# Patient Record
Sex: Female | Born: 1935
Health system: Southern US, Community
[De-identification: ages and names within clinical notes are randomized; demographics above are authoritative.]

## PROBLEM LIST (undated history)

## (undated) DIAGNOSIS — I82409 Acute embolism and thrombosis of unspecified deep veins of unspecified lower extremity: Secondary | ICD-10-CM

## (undated) DIAGNOSIS — K589 Irritable bowel syndrome without diarrhea: Secondary | ICD-10-CM

## (undated) DIAGNOSIS — K635 Polyp of colon: Secondary | ICD-10-CM

## (undated) DIAGNOSIS — C50919 Malignant neoplasm of unspecified site of unspecified female breast: Secondary | ICD-10-CM

## (undated) DIAGNOSIS — I10 Essential (primary) hypertension: Secondary | ICD-10-CM

## (undated) DIAGNOSIS — J189 Pneumonia, unspecified organism: Secondary | ICD-10-CM

## (undated) DIAGNOSIS — E782 Mixed hyperlipidemia: Secondary | ICD-10-CM

## (undated) DIAGNOSIS — I8390 Asymptomatic varicose veins of unspecified lower extremity: Secondary | ICD-10-CM

## (undated) DIAGNOSIS — K5790 Diverticulosis of intestine, part unspecified, without perforation or abscess without bleeding: Secondary | ICD-10-CM

## (undated) DIAGNOSIS — N390 Urinary tract infection, site not specified: Secondary | ICD-10-CM

## (undated) DIAGNOSIS — I509 Heart failure, unspecified: Secondary | ICD-10-CM

## (undated) DIAGNOSIS — I4892 Unspecified atrial flutter: Secondary | ICD-10-CM

## (undated) DIAGNOSIS — M810 Age-related osteoporosis without current pathological fracture: Secondary | ICD-10-CM

## (undated) DIAGNOSIS — M1990 Unspecified osteoarthritis, unspecified site: Secondary | ICD-10-CM

## (undated) DIAGNOSIS — I495 Sick sinus syndrome: Secondary | ICD-10-CM

## (undated) DIAGNOSIS — I4819 Other persistent atrial fibrillation: Secondary | ICD-10-CM

## (undated) DIAGNOSIS — K8020 Calculus of gallbladder without cholecystitis without obstruction: Secondary | ICD-10-CM

## (undated) DIAGNOSIS — K219 Gastro-esophageal reflux disease without esophagitis: Secondary | ICD-10-CM

## (undated) DIAGNOSIS — Z950 Presence of cardiac pacemaker: Secondary | ICD-10-CM

## (undated) HISTORY — DX: Gastro-esophageal reflux disease without esophagitis: K21.9

## (undated) HISTORY — DX: Acute embolism and thrombosis of unspecified deep veins of unspecified lower extremity: I82.409

## (undated) HISTORY — DX: Mixed hyperlipidemia: E78.2

## (undated) HISTORY — DX: Diverticulosis of intestine, part unspecified, without perforation or abscess without bleeding: K57.90

## (undated) HISTORY — DX: Irritable bowel syndrome, unspecified: K58.9

## (undated) HISTORY — PX: EYE SURGERY: SHX253

## (undated) HISTORY — PX: INSERT / REPLACE / REMOVE PACEMAKER: SUR710

## (undated) HISTORY — PX: OTHER SURGICAL HISTORY: SHX169

## (undated) HISTORY — DX: Calculus of gallbladder without cholecystitis without obstruction: K80.20

## (undated) HISTORY — DX: Sick sinus syndrome: I49.5

## (undated) HISTORY — DX: Other persistent atrial fibrillation: I48.19

## (undated) HISTORY — DX: Pneumonia, unspecified organism: J18.9

## (undated) HISTORY — PX: BREAST LUMPECTOMY: SHX2

## (undated) HISTORY — PX: CHOLECYSTECTOMY: SHX55

## (undated) HISTORY — DX: Asymptomatic varicose veins of unspecified lower extremity: I83.90

## (undated) HISTORY — DX: Malignant neoplasm of unspecified site of unspecified female breast: C50.919

## (undated) HISTORY — DX: Unspecified osteoarthritis, unspecified site: M19.90

## (undated) HISTORY — DX: Essential (primary) hypertension: I10

## (undated) HISTORY — DX: Polyp of colon: K63.5

## (undated) HISTORY — PX: DILATION AND CURETTAGE OF UTERUS: SHX78

## (undated) HISTORY — PX: COLONOSCOPY W/ BIOPSIES AND POLYPECTOMY: SHX1376

## (undated) HISTORY — PX: CYST REMOVAL TRUNK: SHX6283

## (undated) HISTORY — DX: Unspecified atrial flutter: I48.92

---

## 1979-02-11 HISTORY — PX: BREAST LUMPECTOMY: SHX2

## 1997-06-12 HISTORY — PX: CARDIAC CATHETERIZATION: SHX172

## 1999-07-19 ENCOUNTER — Encounter: Admission: RE | Admit: 1999-07-19 | Discharge: 1999-07-19 | Payer: Self-pay | Admitting: Obstetrics and Gynecology

## 1999-07-19 ENCOUNTER — Encounter: Payer: Self-pay | Admitting: Obstetrics and Gynecology

## 2000-09-03 ENCOUNTER — Encounter: Admission: RE | Admit: 2000-09-03 | Discharge: 2000-09-03 | Payer: Self-pay | Admitting: Obstetrics and Gynecology

## 2000-09-03 ENCOUNTER — Encounter: Payer: Self-pay | Admitting: Obstetrics and Gynecology

## 2000-09-14 ENCOUNTER — Ambulatory Visit (HOSPITAL_COMMUNITY): Admission: RE | Admit: 2000-09-14 | Discharge: 2000-09-15 | Payer: Self-pay | Admitting: Internal Medicine

## 2000-12-07 ENCOUNTER — Encounter: Admission: RE | Admit: 2000-12-07 | Discharge: 2000-12-26 | Payer: Self-pay | Admitting: Orthopedic Surgery

## 2001-06-12 HISTORY — PX: PACEMAKER INSERTION: SHX728

## 2001-12-23 ENCOUNTER — Encounter (INDEPENDENT_AMBULATORY_CARE_PROVIDER_SITE_OTHER): Payer: Self-pay | Admitting: Cardiology

## 2001-12-23 ENCOUNTER — Inpatient Hospital Stay (HOSPITAL_COMMUNITY): Admission: EM | Admit: 2001-12-23 | Discharge: 2001-12-25 | Payer: Self-pay | Admitting: *Deleted

## 2001-12-23 ENCOUNTER — Encounter: Payer: Self-pay | Admitting: *Deleted

## 2002-04-02 ENCOUNTER — Observation Stay (HOSPITAL_COMMUNITY): Admission: EM | Admit: 2002-04-02 | Discharge: 2002-04-03 | Payer: Self-pay | Admitting: Cardiology

## 2002-04-07 ENCOUNTER — Ambulatory Visit (HOSPITAL_COMMUNITY): Admission: RE | Admit: 2002-04-07 | Discharge: 2002-04-09 | Payer: Self-pay | Admitting: Internal Medicine

## 2002-04-09 ENCOUNTER — Encounter: Payer: Self-pay | Admitting: Internal Medicine

## 2003-03-24 ENCOUNTER — Encounter: Payer: Self-pay | Admitting: Internal Medicine

## 2004-04-13 ENCOUNTER — Ambulatory Visit: Payer: Self-pay | Admitting: Family Medicine

## 2004-04-21 ENCOUNTER — Ambulatory Visit: Payer: Self-pay

## 2004-05-03 ENCOUNTER — Ambulatory Visit: Payer: Self-pay | Admitting: Cardiology

## 2004-05-31 ENCOUNTER — Ambulatory Visit: Payer: Self-pay | Admitting: Cardiology

## 2004-07-06 ENCOUNTER — Ambulatory Visit: Payer: Self-pay | Admitting: Cardiology

## 2004-07-19 ENCOUNTER — Ambulatory Visit: Payer: Self-pay | Admitting: Family Medicine

## 2004-07-22 ENCOUNTER — Ambulatory Visit: Payer: Self-pay | Admitting: Internal Medicine

## 2004-07-22 ENCOUNTER — Ambulatory Visit: Payer: Self-pay | Admitting: Family Medicine

## 2004-07-25 ENCOUNTER — Ambulatory Visit: Payer: Self-pay | Admitting: Cardiology

## 2004-07-29 ENCOUNTER — Ambulatory Visit: Payer: Self-pay | Admitting: Internal Medicine

## 2004-08-05 ENCOUNTER — Ambulatory Visit: Payer: Self-pay | Admitting: Cardiology

## 2004-09-02 ENCOUNTER — Ambulatory Visit: Payer: Self-pay | Admitting: Cardiology

## 2004-09-05 ENCOUNTER — Ambulatory Visit: Payer: Self-pay | Admitting: Cardiology

## 2004-09-12 ENCOUNTER — Ambulatory Visit: Payer: Self-pay | Admitting: Internal Medicine

## 2004-09-19 ENCOUNTER — Ambulatory Visit: Payer: Self-pay | Admitting: Cardiology

## 2004-09-22 ENCOUNTER — Ambulatory Visit: Payer: Self-pay | Admitting: Cardiology

## 2004-10-07 ENCOUNTER — Ambulatory Visit: Payer: Self-pay | Admitting: Cardiology

## 2004-10-14 ENCOUNTER — Ambulatory Visit: Payer: Self-pay | Admitting: Cardiology

## 2004-10-21 ENCOUNTER — Ambulatory Visit: Payer: Self-pay | Admitting: Internal Medicine

## 2004-11-03 ENCOUNTER — Ambulatory Visit: Payer: Self-pay | Admitting: Cardiology

## 2004-11-11 ENCOUNTER — Ambulatory Visit: Payer: Self-pay | Admitting: Cardiology

## 2004-11-25 ENCOUNTER — Ambulatory Visit: Payer: Self-pay | Admitting: Cardiology

## 2004-12-26 ENCOUNTER — Ambulatory Visit: Payer: Self-pay | Admitting: Cardiology

## 2005-01-20 ENCOUNTER — Ambulatory Visit: Payer: Self-pay | Admitting: Internal Medicine

## 2005-01-23 ENCOUNTER — Ambulatory Visit: Payer: Self-pay | Admitting: Cardiology

## 2005-02-20 ENCOUNTER — Ambulatory Visit: Payer: Self-pay | Admitting: Cardiology

## 2005-03-14 ENCOUNTER — Ambulatory Visit: Payer: Self-pay | Admitting: Cardiology

## 2005-04-17 ENCOUNTER — Ambulatory Visit: Payer: Self-pay | Admitting: Cardiology

## 2005-04-19 ENCOUNTER — Ambulatory Visit: Payer: Self-pay | Admitting: Internal Medicine

## 2005-04-28 ENCOUNTER — Ambulatory Visit: Payer: Self-pay | Admitting: Family Medicine

## 2005-05-16 ENCOUNTER — Ambulatory Visit: Payer: Self-pay | Admitting: Cardiology

## 2005-05-22 ENCOUNTER — Ambulatory Visit: Payer: Self-pay | Admitting: Internal Medicine

## 2005-05-24 ENCOUNTER — Ambulatory Visit: Payer: Self-pay | Admitting: Cardiology

## 2005-05-30 ENCOUNTER — Ambulatory Visit: Payer: Self-pay | Admitting: Family Medicine

## 2005-06-13 ENCOUNTER — Ambulatory Visit: Payer: Self-pay | Admitting: Cardiology

## 2005-06-21 ENCOUNTER — Ambulatory Visit: Payer: Self-pay | Admitting: Internal Medicine

## 2005-07-25 ENCOUNTER — Ambulatory Visit: Payer: Self-pay | Admitting: Cardiology

## 2005-07-27 ENCOUNTER — Ambulatory Visit: Payer: Self-pay | Admitting: Internal Medicine

## 2005-08-01 ENCOUNTER — Ambulatory Visit: Payer: Self-pay | Admitting: Cardiology

## 2005-08-08 ENCOUNTER — Ambulatory Visit: Payer: Self-pay | Admitting: Cardiology

## 2005-08-15 ENCOUNTER — Ambulatory Visit: Payer: Self-pay | Admitting: Cardiology

## 2005-08-28 ENCOUNTER — Ambulatory Visit: Payer: Self-pay | Admitting: Internal Medicine

## 2005-09-25 ENCOUNTER — Ambulatory Visit: Payer: Self-pay | Admitting: Family Medicine

## 2005-09-28 ENCOUNTER — Ambulatory Visit: Payer: Self-pay | Admitting: Cardiology

## 2005-10-26 ENCOUNTER — Ambulatory Visit: Payer: Self-pay | Admitting: Cardiology

## 2005-10-30 ENCOUNTER — Ambulatory Visit: Payer: Self-pay | Admitting: Internal Medicine

## 2005-12-05 ENCOUNTER — Ambulatory Visit: Payer: Self-pay | Admitting: Cardiology

## 2005-12-11 ENCOUNTER — Ambulatory Visit: Payer: Self-pay | Admitting: Internal Medicine

## 2005-12-21 ENCOUNTER — Ambulatory Visit: Payer: Self-pay | Admitting: Family Medicine

## 2006-01-02 ENCOUNTER — Ambulatory Visit: Payer: Self-pay | Admitting: Cardiology

## 2006-01-15 ENCOUNTER — Ambulatory Visit: Payer: Self-pay | Admitting: Internal Medicine

## 2006-01-30 ENCOUNTER — Ambulatory Visit: Payer: Self-pay | Admitting: Cardiology

## 2006-02-09 ENCOUNTER — Ambulatory Visit: Payer: Self-pay | Admitting: Internal Medicine

## 2006-02-14 ENCOUNTER — Ambulatory Visit: Payer: Self-pay | Admitting: Family Medicine

## 2006-02-21 ENCOUNTER — Ambulatory Visit: Payer: Self-pay | Admitting: Family Medicine

## 2006-04-03 ENCOUNTER — Ambulatory Visit: Payer: Self-pay | Admitting: Internal Medicine

## 2006-04-04 ENCOUNTER — Ambulatory Visit: Payer: Self-pay | Admitting: Cardiology

## 2006-04-05 ENCOUNTER — Ambulatory Visit: Payer: Self-pay | Admitting: Cardiology

## 2006-04-12 ENCOUNTER — Ambulatory Visit: Payer: Self-pay | Admitting: Cardiology

## 2006-04-19 ENCOUNTER — Ambulatory Visit: Payer: Self-pay | Admitting: Cardiology

## 2006-04-23 ENCOUNTER — Ambulatory Visit: Payer: Self-pay | Admitting: Family Medicine

## 2006-05-04 ENCOUNTER — Ambulatory Visit: Payer: Self-pay | Admitting: Cardiology

## 2006-05-07 ENCOUNTER — Ambulatory Visit: Payer: Self-pay | Admitting: Internal Medicine

## 2006-05-11 ENCOUNTER — Ambulatory Visit: Payer: Self-pay | Admitting: Cardiology

## 2006-05-14 ENCOUNTER — Ambulatory Visit: Payer: Self-pay

## 2006-05-21 ENCOUNTER — Ambulatory Visit: Payer: Self-pay | Admitting: Cardiology

## 2006-06-11 ENCOUNTER — Ambulatory Visit: Payer: Self-pay | Admitting: Cardiology

## 2006-06-26 ENCOUNTER — Ambulatory Visit: Payer: Self-pay | Admitting: Internal Medicine

## 2006-07-02 ENCOUNTER — Ambulatory Visit: Payer: Self-pay | Admitting: Cardiology

## 2006-07-31 ENCOUNTER — Ambulatory Visit: Payer: Self-pay | Admitting: Cardiology

## 2006-08-30 ENCOUNTER — Ambulatory Visit: Payer: Self-pay | Admitting: Cardiology

## 2006-09-06 ENCOUNTER — Ambulatory Visit: Payer: Self-pay | Admitting: Cardiology

## 2006-09-18 ENCOUNTER — Ambulatory Visit: Payer: Self-pay | Admitting: Internal Medicine

## 2006-09-20 ENCOUNTER — Ambulatory Visit: Payer: Self-pay | Admitting: Cardiology

## 2006-10-03 ENCOUNTER — Ambulatory Visit: Payer: Self-pay | Admitting: Family Medicine

## 2006-11-29 ENCOUNTER — Ambulatory Visit: Payer: Self-pay | Admitting: Physician Assistant

## 2006-12-11 ENCOUNTER — Ambulatory Visit: Payer: Self-pay | Admitting: Internal Medicine

## 2007-01-02 ENCOUNTER — Ambulatory Visit: Payer: Self-pay | Admitting: Cardiology

## 2007-01-09 ENCOUNTER — Ambulatory Visit: Payer: Self-pay | Admitting: Internal Medicine

## 2007-01-30 ENCOUNTER — Ambulatory Visit: Payer: Self-pay | Admitting: Cardiology

## 2007-02-23 ENCOUNTER — Ambulatory Visit: Payer: Self-pay | Admitting: Internal Medicine

## 2007-02-27 ENCOUNTER — Ambulatory Visit: Payer: Self-pay | Admitting: Cardiology

## 2007-03-27 ENCOUNTER — Ambulatory Visit: Payer: Self-pay | Admitting: Cardiology

## 2007-04-24 ENCOUNTER — Ambulatory Visit: Payer: Self-pay | Admitting: Cardiology

## 2007-05-28 ENCOUNTER — Ambulatory Visit: Payer: Self-pay | Admitting: Internal Medicine

## 2007-05-30 ENCOUNTER — Ambulatory Visit: Payer: Self-pay | Admitting: Cardiology

## 2007-06-27 ENCOUNTER — Ambulatory Visit: Payer: Self-pay | Admitting: Cardiology

## 2007-07-22 ENCOUNTER — Ambulatory Visit: Payer: Self-pay | Admitting: Cardiology

## 2007-07-29 ENCOUNTER — Ambulatory Visit: Payer: Self-pay | Admitting: Internal Medicine

## 2007-08-22 ENCOUNTER — Ambulatory Visit: Payer: Self-pay | Admitting: Cardiology

## 2007-09-03 ENCOUNTER — Ambulatory Visit (HOSPITAL_COMMUNITY): Admission: RE | Admit: 2007-09-03 | Discharge: 2007-09-03 | Payer: Self-pay | Admitting: Ophthalmology

## 2007-09-26 ENCOUNTER — Ambulatory Visit: Payer: Self-pay | Admitting: Cardiology

## 2007-10-08 ENCOUNTER — Ambulatory Visit (HOSPITAL_COMMUNITY): Admission: RE | Admit: 2007-10-08 | Discharge: 2007-10-08 | Payer: Self-pay | Admitting: Ophthalmology

## 2007-10-24 ENCOUNTER — Ambulatory Visit: Payer: Self-pay | Admitting: Cardiology

## 2007-10-28 ENCOUNTER — Ambulatory Visit: Payer: Self-pay | Admitting: Internal Medicine

## 2007-12-09 ENCOUNTER — Ambulatory Visit: Payer: Self-pay | Admitting: Cardiology

## 2008-02-07 ENCOUNTER — Ambulatory Visit: Payer: Self-pay | Admitting: Internal Medicine

## 2008-02-10 ENCOUNTER — Emergency Department (HOSPITAL_COMMUNITY): Admission: EM | Admit: 2008-02-10 | Discharge: 2008-02-10 | Payer: Self-pay | Admitting: *Deleted

## 2008-02-10 ENCOUNTER — Encounter: Payer: Self-pay | Admitting: Cardiology

## 2008-04-03 ENCOUNTER — Ambulatory Visit: Payer: Self-pay | Admitting: Cardiology

## 2008-04-06 ENCOUNTER — Ambulatory Visit: Payer: Self-pay | Admitting: Cardiology

## 2008-04-27 ENCOUNTER — Ambulatory Visit: Payer: Self-pay | Admitting: Internal Medicine

## 2008-05-05 ENCOUNTER — Ambulatory Visit: Payer: Self-pay | Admitting: Cardiology

## 2008-05-15 ENCOUNTER — Encounter (INDEPENDENT_AMBULATORY_CARE_PROVIDER_SITE_OTHER): Payer: Self-pay | Admitting: *Deleted

## 2008-05-15 ENCOUNTER — Ambulatory Visit: Payer: Self-pay | Admitting: Internal Medicine

## 2008-05-15 DIAGNOSIS — Z860100 Personal history of colon polyps, unspecified: Secondary | ICD-10-CM | POA: Insufficient documentation

## 2008-05-15 DIAGNOSIS — I480 Paroxysmal atrial fibrillation: Secondary | ICD-10-CM | POA: Insufficient documentation

## 2008-05-26 ENCOUNTER — Ambulatory Visit: Payer: Self-pay | Admitting: Cardiology

## 2008-06-09 ENCOUNTER — Encounter: Payer: Self-pay | Admitting: Internal Medicine

## 2008-06-09 ENCOUNTER — Ambulatory Visit: Payer: Self-pay | Admitting: Internal Medicine

## 2008-06-14 ENCOUNTER — Encounter: Payer: Self-pay | Admitting: Internal Medicine

## 2008-07-08 ENCOUNTER — Encounter: Payer: Self-pay | Admitting: Internal Medicine

## 2008-07-27 ENCOUNTER — Ambulatory Visit: Payer: Self-pay | Admitting: Internal Medicine

## 2008-07-28 ENCOUNTER — Encounter: Payer: Self-pay | Admitting: Internal Medicine

## 2008-08-06 ENCOUNTER — Ambulatory Visit: Payer: Self-pay | Admitting: Internal Medicine

## 2008-09-10 ENCOUNTER — Encounter: Payer: Self-pay | Admitting: Internal Medicine

## 2008-09-11 ENCOUNTER — Encounter: Payer: Self-pay | Admitting: Internal Medicine

## 2008-09-14 ENCOUNTER — Telehealth: Payer: Self-pay | Admitting: Internal Medicine

## 2008-10-26 ENCOUNTER — Ambulatory Visit: Payer: Self-pay | Admitting: Internal Medicine

## 2008-11-14 ENCOUNTER — Ambulatory Visit: Payer: Self-pay | Admitting: Cardiology

## 2008-11-14 ENCOUNTER — Encounter: Payer: Self-pay | Admitting: Cardiology

## 2008-11-15 ENCOUNTER — Encounter: Payer: Self-pay | Admitting: Cardiology

## 2008-11-16 ENCOUNTER — Encounter: Payer: Self-pay | Admitting: Cardiology

## 2008-11-20 ENCOUNTER — Telehealth: Payer: Self-pay | Admitting: Internal Medicine

## 2008-12-03 ENCOUNTER — Ambulatory Visit: Payer: Self-pay | Admitting: Cardiology

## 2008-12-07 ENCOUNTER — Inpatient Hospital Stay (HOSPITAL_COMMUNITY): Admission: EM | Admit: 2008-12-07 | Discharge: 2008-12-13 | Payer: Self-pay | Admitting: Emergency Medicine

## 2008-12-07 ENCOUNTER — Encounter: Payer: Self-pay | Admitting: Cardiology

## 2009-02-02 DIAGNOSIS — I10 Essential (primary) hypertension: Secondary | ICD-10-CM | POA: Insufficient documentation

## 2009-02-02 DIAGNOSIS — E785 Hyperlipidemia, unspecified: Secondary | ICD-10-CM | POA: Insufficient documentation

## 2009-02-05 ENCOUNTER — Ambulatory Visit: Payer: Self-pay | Admitting: Internal Medicine

## 2009-03-22 ENCOUNTER — Encounter: Payer: Self-pay | Admitting: Cardiology

## 2009-05-10 ENCOUNTER — Encounter: Payer: Self-pay | Admitting: Internal Medicine

## 2009-05-10 ENCOUNTER — Ambulatory Visit: Payer: Self-pay | Admitting: Internal Medicine

## 2009-06-12 DIAGNOSIS — J189 Pneumonia, unspecified organism: Secondary | ICD-10-CM

## 2009-06-12 HISTORY — DX: Pneumonia, unspecified organism: J18.9

## 2009-06-18 ENCOUNTER — Encounter: Payer: Self-pay | Admitting: Cardiology

## 2009-06-18 ENCOUNTER — Ambulatory Visit: Payer: Self-pay | Admitting: Cardiology

## 2009-07-06 ENCOUNTER — Encounter: Payer: Self-pay | Admitting: Cardiology

## 2009-07-23 ENCOUNTER — Ambulatory Visit: Payer: Self-pay | Admitting: Internal Medicine

## 2009-08-09 ENCOUNTER — Ambulatory Visit: Payer: Self-pay | Admitting: Internal Medicine

## 2009-08-10 ENCOUNTER — Telehealth (INDEPENDENT_AMBULATORY_CARE_PROVIDER_SITE_OTHER): Payer: Self-pay | Admitting: *Deleted

## 2009-11-09 ENCOUNTER — Ambulatory Visit: Payer: Self-pay | Admitting: Internal Medicine

## 2009-12-14 ENCOUNTER — Encounter: Payer: Self-pay | Admitting: Cardiology

## 2009-12-30 ENCOUNTER — Ambulatory Visit: Payer: Self-pay | Admitting: Cardiology

## 2010-02-08 ENCOUNTER — Ambulatory Visit: Payer: Self-pay | Admitting: Internal Medicine

## 2010-03-19 ENCOUNTER — Ambulatory Visit (HOSPITAL_COMMUNITY): Admission: RE | Admit: 2010-03-19 | Discharge: 2010-03-19 | Payer: Self-pay | Admitting: Orthopedic Surgery

## 2010-03-26 ENCOUNTER — Emergency Department (HOSPITAL_COMMUNITY): Admission: EM | Admit: 2010-03-26 | Discharge: 2010-03-26 | Payer: Self-pay | Admitting: Emergency Medicine

## 2010-04-29 ENCOUNTER — Ambulatory Visit: Payer: Self-pay | Admitting: Internal Medicine

## 2010-06-29 ENCOUNTER — Encounter: Payer: Self-pay | Admitting: Internal Medicine

## 2010-07-11 ENCOUNTER — Ambulatory Visit: Admit: 2010-07-11 | Payer: Self-pay | Admitting: Cardiology

## 2010-07-12 NOTE — Cardiovascular Report (Signed)
Summary: TTM   TTM   Imported By: Roderic Ovens 02/28/2010 11:02:44  _____________________________________________________________________  External Attachment:    Type:   Image     Comment:   External Document

## 2010-07-12 NOTE — Cardiovascular Report (Signed)
Summary: TTM   TTM   Imported By: Roderic Ovens 08/19/2009 16:18:06  _____________________________________________________________________  External Attachment:    Type:   Image     Comment:   External Document

## 2010-07-12 NOTE — Assessment & Plan Note (Signed)
Summary: 6 mo fu per dec reminder-srs  Medications Added GLUCOSAMINE-CHONDROITIN 1500-1200 MG/30ML LIQD (GLUCOSAMINE-CHONDROITIN) two tablet by mouth once daily      Allergies Added: NKDA  Visit Type:  Follow-up Primary Provider:  Dr. Joette Catching   History of Present Illness: 77 year old woman presents for a followup visit. She has rare palpitations, no recent dizziness, and no syncope. She is not reporting any chest pain.  Patient had device followup with Dr. Graciela Husbands since I saw her, as well as trans-telephonically. She has been on amiodarone 100 mg p.o. b.i.d. She reports a battery of labs per Dr. Lysbeth Galas back in October.    Preventive Screening-Counseling & Management  Alcohol-Tobacco     Smoking Status: never  Current Medications (verified): 1)  Aspirin 81 Mg  Tabs (Aspirin) .... One Tablet By Mouth Once Daily 2)  Warfarin Sodium 5 Mg Tabs (Warfarin Sodium) .... One Tablet By Mouth Once Daily 3)  Oscal 500/200 D-3 500-200 Mg-Unit Tabs (Calcium-Vitamin D) .... 2 Tablets By Mouth Once Daily 4)  Multivitamins   Tabs (Multiple Vitamin) .... One Tablet By Mouth Once Daily 5)  Glucosamine-Chondroitin 1500-1200 Mg/28ml Liqd (Glucosamine-Chondroitin) .... Two Tablet By Mouth Once Daily 6)  Amiodarone Hcl 200 Mg Tabs (Amiodarone Hcl) .... Take 1/2  Tablet By Mouth Daily 7)  Cyanocobalamin 1000 Mcg/ml Soln (Cyanocobalamin) .... Monthly 8)  Nadolol 40 Mg Tabs (Nadolol) .... Take One Tablet By Mouth Daily 9)  Lumigan 0.03 % Soln (Bimatoprost) .... At Bedtime 10)  Metoprolol Succinate 50 Mg Xr24h-Tab (Metoprolol Succinate) .... Take One Tablet By Mouth As Needed  Allergies (verified): No Known Drug Allergies  Comments:  Nurse/Medical Assistant: The patient's medications and allergies were reviewed with the patient and were updated in the Medication and Allergy Lists. List brought.  Past History:  Past Surgical History: Last updated: 06/17/2009 Cholecystectomy Pacemaker - dual  chamber [employer], 2003 Right breast lumpectomy Cholecystectomy  Social History: Last updated: 06/17/2009 Widowed Retired Patient has never smoked Alcohol Use - no Illicit Drug Use - no  Past Medical History: Arthritis Glaucoma Diverticulosis GERD Hyperlipidemia Hypertension Colonic polyps Atrial Fibrillation Atrial Flutter s/p RFA 2003 LVEF 55-60%, low risk Cardiolite 2010 Pneumonia with encephalopathy, July 2010  Review of Systems  The patient denies anorexia, fever, chest pain, syncope, peripheral edema, headaches, hemoptysis, melena, hematochezia, and severe indigestion/heartburn.         Otherwise reviewed and negative.  Vital Signs:  Patient profile:   77 year old female Height:      66 inches Weight:      184 pounds Pulse rate:   68 / minute BP sitting:   139 / 82  (left arm) Cuff size:   large  Vitals Entered By: Carlye Grippe (June 18, 2009 10:42 AM)   Physical Exam  Additional Exam:  Overweight woman in no acute distress. HEENT: Conjunctiva and lids normal, oropharynx clear. Neck: Supple, no carotid bruits, no elevated jugular venous pressure. Lungs: Clear to auscultation, nonlabored. Cardiac: Regular rate and rhythm, no significant systolic murmur or S3.Marland Kitchen Extremities: No pitting.   EKG  Procedure date:  06/18/2009  Findings:      Atrial paced rhythm at 60 beats per minutes, leftward axis, nonspecific ST-T wave changes.  PPM Specifications Following MD:  Sherryl Manges, MD     PPM Vendor:  St Jude     PPM Model Number:  [id number]     PPM Serial Number:  [id number] PPM DOI:  04/08/2002     PPM Implanting MD:  Sherryl Manges, MD  Lead 1    Location: [facility]     DOI: 04/08/2002     Model #: 1488TC     Serial #: [id number]     Status: active Lead 2    Location: [facility]     DOI: 04/08/2002     Model #: 1488TC     Serial #: [id number]     Status: active  Magnet Response Rate:  BOL 98.6 ERI  86.3  Indications:  A-Flutter  Explantation Comments:  TTM's with  [employer]  PPM Follow Up Pacer Dependent:  No      Parameters Mode:  DDDR     Lower Rate Limit:  60     Upper Rate Limit:  105 Paced AV Delay:  275     Sensed AV Delay:  275  Impression & Recommendations:  Problem # 1:  ATRIAL FIBRILLATION, PAROXYSMAL (ICD-427.31)  Symptomatically well controlled on present regimen, including low-dose amiodarone. Surveillance labs are followed by Dr. Lysbeth Galas. We will obtain these for review. She continues on Coumadin without major bleeding problems. Followup will be arranged in 6 months.  Her updated medication list for this problem includes:    Aspirin 81 Mg Tabs (Aspirin) ..... One tablet by mouth once daily    Warfarin Sodium 5 Mg Tabs (Warfarin sodium) ..... One tablet by mouth once daily    Amiodarone Hcl 200 Mg Tabs (Amiodarone hcl) .Marland Kitchen... Take 1/2  tablet by mouth daily    Nadolol 40 Mg Tabs (Nadolol) .Marland Kitchen... Take one tablet by mouth daily    Metoprolol Succinate 50 Mg Xr24h-tab (Metoprolol succinate) .Marland Kitchen... Take one tablet by mouth as needed  Orders: EKG w/ Interpretation (93000)  Problem # 2:  PACEMAKER, PERMANENT STJ (ICD-V45.01)  Followed by Dr. Graciela Husbands. Suspect that she will be transitioning followup with Dr. Johney Frame here in [place].  Patient Instructions: 1)  Your physician recommends that you continue on your current medications as directed. Please refer to the Current Medication list given to you today. 2)  Follow up in  6 months

## 2010-07-12 NOTE — Assessment & Plan Note (Signed)
Summary: PC2      Allergies Added: NKDA  Visit Type:  Pacemaker check Primary Provider:  Dr. Joette Catching   History of Present Illness: The patient presents today for routine electrophysiology followup. She reports doing very well since last being seen in our clinic. The patient denies symptoms of palpitations, chest pain, shortness of breath, orthopnea, PND, lower extremity edema, dizziness, presyncope, syncope, or neurologic sequela. The patient is tolerating medications without difficulties and is otherwise without complaint today.   Preventive Screening-Counseling & Management  Alcohol-Tobacco     Smoking Status: never  Current Medications (verified): 1)  Aspirin 81 Mg  Tabs (Aspirin) .... One Tablet By Mouth Once Daily 2)  Warfarin Sodium 5 Mg Tabs (Warfarin Sodium) .... One Tablet By Mouth Once Daily 3)  Oscal 500/200 D-3 500-200 Mg-Unit Tabs (Calcium-Vitamin D) .... 2 Tablets By Mouth Once Daily 4)  Multivitamins   Tabs (Multiple Vitamin) .... One Tablet By Mouth Once Daily 5)  Glucosamine-Chondroitin 1500-1200 Mg/55ml Liqd (Glucosamine-Chondroitin) .... Two Tablet By Mouth Once Daily 6)  Amiodarone Hcl 200 Mg Tabs (Amiodarone Hcl) .... Take 1/2  Tablet By Mouth Daily 7)  Cyanocobalamin 1000 Mcg/ml Soln (Cyanocobalamin) .... Monthly 8)  Nadolol 40 Mg Tabs (Nadolol) .... Take One Tablet By Mouth Daily 9)  Lumigan 0.03 % Soln (Bimatoprost) .... One Drop Ou At Bedtime 10)  Metoprolol Succinate 50 Mg Xr24h-Tab (Metoprolol Succinate) .... Take One Tablet By Mouth As Needed 11)  Crestor 10 Mg Tabs (Rosuvastatin Calcium) .... Take One Tablet By Mouth Daily. 12)  Amlodipine Besylate 2.5 Mg Tabs (Amlodipine Besylate) .... Take 1 Tablet By Mouth Once A Day  Allergies (verified): No Known Drug Allergies  Comments:  Nurse/Medical Assistant: The patient's medication list and allergies were reviewed with the patient and were updated in the Medication and Allergy Lists.  Past  History:  Past Medical History: Arthritis Glaucoma Diverticulosis GERD Hyperlipidemia Hypertension Colonic polyps Paroxysmal Atrial Fibrillation Atrial Flutter s/p RFA 2003 LVEF 55-60%, low risk Cardiolite 2010 Pneumonia with encephalopathy, July 2010  Past Surgical History: Reviewed history from 06/17/2009 and no changes required. Cholecystectomy Pacemaker - dual chamber [employer], 2003 Right breast lumpectomy Cholecystectomy  Social History: Reviewed history from 06/17/2009 and no changes required. Widowed Retired Patient has never smoked Alcohol Use - no Illicit Drug Use - no  Review of Systems       All systems are reviewed and negative except as listed in the HPI.   Vital Signs:  Patient profile:   77 year old female Height:      66 inches Weight:      191 pounds Pulse rate:   67 / minute BP sitting:   117 / 79  (left arm) Cuff size:   large  Vitals Entered By: Carlye Grippe (April 29, 2010 3:38 PM)  Physical Exam  General:  Well developed, well nourished, in no acute distress. Head:  normocephalic and atraumatic Eyes:  PERRLA/EOM intact; conjunctiva and lids normal. Mouth:  Teeth, gums and palate normal. Oral mucosa normal. Neck:  supple, no bruits Chest Wall:  R sided pacemaker is well healed Lungs:  Clear bilaterally to auscultation and percussion. Heart:  Non-displaced PMI, chest non-tender; regular rate and rhythm, S1, S2 without murmurs, rubs or gallops. Carotid upstroke normal, no bruit. Normal abdominal aortic size, no bruits. Femorals normal pulses, no bruits. Pedals normal pulses. No edema, no varicosities. Abdomen:  Bowel sounds positive; abdomen soft and non-tender without masses, organomegaly, or hernias noted. No hepatosplenomegaly. Msk:  Back normal, normal gait. Muscle strength and tone normal.  L wrist in a soft splint s/p recent fall and fracture Pulses:  pulses normal in all 4 extremities Extremities:  No clubbing or  cyanosis. Neurologic:  Alert and oriented x 3.   PPM Specifications Following MD:  Hillis Range, MD     Referring MD:  MCDOWELL PPM Vendor:  St Jude     PPM Model Number:  [id number]     PPM Serial Number:  [id number] PPM DOI:  04/08/2002     PPM Implanting MD:  Sherryl Manges, MD  Lead 1    Location: [facility]     DOI: 04/08/2002     Model #: 1488TC     Serial #: [id number]     Status: active Lead 2    Location: RV     DOI: 04/08/2002     Model #: 1488TC     Serial #: [id number]     Status: active  Magnet Response Rate:  BOL 98.6 ERI  86.3  Indications:  A-Flutter  Explantation Comments:  TTM's with [employer]  PPM Follow Up Battery Voltage:  2.74 V     Pacer Dependent:  No       PPM Device Measurements Atrium  Amplitude: 0.49 mV, Impedance: 446 ohms, Threshold: 0.75 V at 0.5 msec Right Ventricle  Amplitude: 12.47 mV, Impedance: 485 ohms, Threshold: 0.875 V at 0.5 msec  Episodes MS Episodes:  408     Percent Mode Switch:  1%     Coumadin:  Yes Ventricular High Rate:  0     Atrial Pacing:  97%     Ventricular Pacing:  2%  Parameters Mode:  DDDR     Lower Rate Limit:  60     Upper Rate Limit:  105 Paced AV Delay:  350     Sensed AV Delay:  275 Next Cardiology Appt Due:  10/11/2010 Tech Comments:  408 MODE SWITCHES. + WARFARIN.  NORMAL DEVICE FUNCTION.  CHANGED RA OUTPUT FROM 2.5 TO 2.0 V.  CHANGED RV SENSITIVITY FROM 2.5 TO 3.6mV. ROV IN 6 MTHS W/DEVICE CLINIC. Vella Kohler  April 29, 2010 4:18 PM MD Comments:  agree  Impression & Recommendations:  Problem # 1:  PACEMAKER, PERMANENT STJ (ICD-V45.01) normal pacemaker function for symptomatic bradycardia as above  Problem # 2:  ATRIAL FIBRILLATION, PAROXYSMAL (ICD-427.31) well controlled with amiodarone 100mg  daily continue coumadin for stroke prevention  Dr Lysbeth Galas to follow TFTs, LFTs, and CXR every 6 months  Problem # 3:  HYPERTENSION, UNSPECIFIED (ICD-401.9) stable no changes  Patient Instructions: 1)  return to device clinic in 6  months

## 2010-07-12 NOTE — Cardiovascular Report (Signed)
Summary: Card Device [facility]/ MODEL 5346  Card Device Clinic/ MODEL 5346   Imported By: Dorise Hiss 05/04/2010 12:31:36  _____________________________________________________________________  External Attachment:    Type:   Image     Comment:   External Document

## 2010-07-12 NOTE — Progress Notes (Signed)
Summary: Increased BP   Phone Note Call from Patient Call back at [facility] Phone [phone]   Summary of Call: Pt called the office stating her blood pressure has been running high and she's not feeling well. She states she s/w Dr. Lysbeth Galas last week and he started her on Lisinopril 2.5mg  by mouth once daily. He saw her yesterday and increased Lisinopril to 5mg  by mouth once daily. Her children wanted her to call us to see if we could manage BP better b/c it is still high. BP readings are as follows: 172/103, 188/108, 167/108, 162/93, 154/101, 133/82, 129/74, 141/82, 171/105, 184/104, 163/101, 164/110 (yesterday at Dr. Joyce Copa office), 152/97 (last pm), 164/104 (this am before taking meds), 174/99 (this afternoon after lunch). She states she is taking Lisinopril 5mg  by mouth once daily, Nadolol 40mg  by mouth once daily, Amlodipine 5mg  by mouth once daily. She states she first noticed BP was up on Thursday when she began to have a headache, aching and lightheaded feeling. She is aware we will discuss with MD and notify of his suggestions. Pt aware to go to ER for worsening BP, slurred speech, weakness or any signs/symptoms of stroke. Pt verbalized understanding.  Initial call taken by: Cyril Loosen, RN, BSN,  August 10, 2009 4:01 PM  Follow-up for Phone Call        The medications changes just made are reasonable.  Could take the Lisinopril and Norvasc at different times (12 hours apart - one in AM and one in PM).  It may be that the increased dose Lisinopril has just not yet taken effect.  Keep following BP. Follow-up by: Loreli Slot, MD, [facility],  August 10, 2009 5:48 PM  Additional Follow-up for Phone Call Additional follow up Details #1::        Pt notified and verbalized understanding. She will try to take either amlodipine or Lisinopril on am and the other in pm. She will continue to monitor BP and notify us or Dr. Thad Ranger problems. Additional Follow-up by: Cyril Loosen, RN, BSN,   August 11, 2009 9:37 AM

## 2010-07-12 NOTE — Cardiovascular Report (Signed)
Summary: Transtelephonic Pacemaker Monitoring Report  Transtelephonic Pacemaker Monitoring Report   Imported By: Debby Freiberg 12/08/2009 14:21:36  _____________________________________________________________________  External Attachment:    Type:   Image     Comment:   External Document

## 2010-07-12 NOTE — Cardiovascular Report (Signed)
Summary: Office Visit   Office Visit   Imported By: Roderic Ovens 08/03/2009 11:36:48  _____________________________________________________________________  External Attachment:    Type:   Image     Comment:   External Document

## 2010-07-12 NOTE — Procedures (Signed)
Summary: PACER CK RECV REMINDER  Medications Added CRESTOR 10 MG TABS (ROSUVASTATIN CALCIUM) Take one tablet by mouth daily. LISINOPRIL 5 MG TABS (LISINOPRIL) Take one tablet by mouth daily      Allergies Added: NKDA  Current Medications (verified): 1)  Aspirin 81 Mg  Tabs (Aspirin) .... One Tablet By Mouth Once Daily 2)  Warfarin Sodium 5 Mg Tabs (Warfarin Sodium) .... One Tablet By Mouth Once Daily 3)  Oscal 500/200 D-3 500-200 Mg-Unit Tabs (Calcium-Vitamin D) .... 2 Tablets By Mouth Once Daily 4)  Multivitamins   Tabs (Multiple Vitamin) .... One Tablet By Mouth Once Daily 5)  Glucosamine-Chondroitin 1500-1200 Mg/91ml Liqd (Glucosamine-Chondroitin) .... Two Tablet By Mouth Once Daily 6)  Amiodarone Hcl 200 Mg Tabs (Amiodarone Hcl) .... Take 1/2  Tablet By Mouth Daily 7)  Cyanocobalamin 1000 Mcg/ml Soln (Cyanocobalamin) .... Monthly 8)  Nadolol 40 Mg Tabs (Nadolol) .... Take One Tablet By Mouth Daily 9)  Lumigan 0.03 % Soln (Bimatoprost) .... At Bedtime 10)  Metoprolol Succinate 50 Mg Xr24h-Tab (Metoprolol Succinate) .... Take One Tablet By Mouth As Needed 11)  Crestor 10 Mg Tabs (Rosuvastatin Calcium) .... Take One Tablet By Mouth Daily. 12)  Lisinopril 5 Mg Tabs (Lisinopril) .... Take One Tablet By Mouth Daily  Allergies (verified): No Known Drug Allergies  PPM Specifications Following MD:  Hillis Range, MD     Referring MD:  MCDOWELL PPM Vendor:  St Jude     PPM Model Number:  [id number]     PPM Serial Number:  [id number] PPM DOI:  04/08/2002     PPM Implanting MD:  Sherryl Manges, MD  Lead 1    Location: RA     DOI: 04/08/2002     Model #: 1488TC     Serial #: [id number]     Status: active Lead 2    Location: RV     DOI: 04/08/2002     Model #: 1488TC     Serial #: [id number]     Status: active  Magnet Response Rate:  BOL 98.6 ERI  86.3  Indications:  A-Flutter  Explantation Comments:  TTM's with [employer]  PPM Follow Up Remote Check?  No Battery Voltage:  2.73 V     Pacer Dependent:  No        PPM Device Measurements Atrium  Amplitude: paced at 30 mV, Impedance: 437 ohms, Threshold: 1.25 V at 0.5 msec Right Ventricle  Amplitude: 12.5 mV, Impedance: 463 ohms, Threshold: 0.75 V at 0.5 msec  Episodes MS Episodes:  0     Ventricular High Rate:  NOT AVAILABLE     Atrial Pacing:  97%     Ventricular Pacing:  23%  Parameters Mode:  DDDR     Lower Rate Limit:  60     Upper Rate Limit:  105 Paced AV Delay:  350     Sensed AV Delay:  275 Next Cardiology Appt Due:  01/10/2010 Tech Comments:  Normal device function.  PAV changed to today to allow for intrinsic conduction. No other changes made.  Pt does TTM's with [employer].  ROV 6 months Dr Johney Frame. Gypsy Balsam RN BSN  July 23, 2009 10:03 AM

## 2010-07-12 NOTE — Assessment & Plan Note (Signed)
Summary: 67mtfollowup/rm  Medications Added LUMIGAN 0.03 % SOLN (BIMATOPROST) one drop OU at bedtime AMLODIPINE BESYLATE 2.5 MG TABS (AMLODIPINE BESYLATE) Take 1 tablet by mouth once a day      Allergies Added: NKDA  Visit Type:  Follow-up Primary Provider:  Dr. Joette Catching   History of Present Illness: 77 year old woman presents for followup. I last saw her in January of this year. Blood pressure control has been an issue since her last visit, and she has undergone some medication adjustments per Dr. Lysbeth Galas. Blood pressure is much better today. She did not bring in a home measurement list for review.  Pacemaker is now followed by Dr. Johney Frame, with followup being scheduled in the near future. She denies any palpitations.  She states that she just recently had blood work with Dr. Lysbeth Galas.  She does complain of generally being fatigued. She has not been walking regularly. We did talk about this some today.  Preventive Screening-Counseling & Management  Alcohol-Tobacco     Smoking Status: never  Current Medications (verified): 1)  Aspirin 81 Mg  Tabs (Aspirin) .... One Tablet By Mouth Once Daily 2)  Warfarin Sodium 5 Mg Tabs (Warfarin Sodium) .... One Tablet By Mouth Once Daily 3)  Oscal 500/200 D-3 500-200 Mg-Unit Tabs (Calcium-Vitamin D) .... 2 Tablets By Mouth Once Daily 4)  Multivitamins   Tabs (Multiple Vitamin) .... One Tablet By Mouth Once Daily 5)  Glucosamine-Chondroitin 1500-1200 Mg/74ml Liqd (Glucosamine-Chondroitin) .... Two Tablet By Mouth Once Daily 6)  Amiodarone Hcl 200 Mg Tabs (Amiodarone Hcl) .... Take 1/2  Tablet By Mouth Daily 7)  Cyanocobalamin 1000 Mcg/ml Soln (Cyanocobalamin) .... Monthly 8)  Nadolol 40 Mg Tabs (Nadolol) .... Take One Tablet By Mouth Daily 9)  Lumigan 0.03 % Soln (Bimatoprost) .... One Drop Ou At Bedtime 10)  Metoprolol Succinate 50 Mg Xr24h-Tab (Metoprolol Succinate) .... Take One Tablet By Mouth As Needed 11)  Crestor 10 Mg Tabs  (Rosuvastatin Calcium) .... Take One Tablet By Mouth Daily. 12)  Amlodipine Besylate 2.5 Mg Tabs (Amlodipine Besylate) .... Take 1 Tablet By Mouth Once A Day  Allergies (verified): No Known Drug Allergies  Comments:  Nurse/Medical Assistant: The patient's medication list and allergies were reviewed with the patient and were updated in the Medication and Allergy Lists.  Past History:  Past Medical History: Last updated: 06/18/2009 Arthritis Glaucoma Diverticulosis GERD Hyperlipidemia Hypertension Colonic polyps Atrial Fibrillation Atrial Flutter s/p RFA 2003 LVEF 55-60%, low risk Cardiolite 2010 Pneumonia with encephalopathy, July 2010  Past Surgical History: Last updated: 06/17/2009 Cholecystectomy Pacemaker - dual chamber [employer] Jude, 2003 Right breast lumpectomy Cholecystectomy  Social History: Last updated: 06/17/2009 Widowed Retired Patient has never smoked Alcohol Use - no Illicit Drug Use - no  Review of Systems       The patient complains of dyspnea on exertion.  The patient denies anorexia, weight gain, chest pain, syncope, peripheral edema, melena, and hematochezia.         Otherwise reviewed and negative.  Vital Signs:  Patient profile:   77 year old female Height:      66 inches Weight:      185 pounds BMI:     29.97 O2 Sat:      100 % on Room air Pulse rate:   61 / minute BP sitting:   121 / 82  (left arm) Cuff size:   large  Vitals Entered By: Carlye Grippe (December 30, 2009 10:00 AM)  Nutrition Counseling: Patient's BMI is greater than 25  and therefore counseled on weight management options.  O2 Flow:  Room air  Physical Exam  Additional Exam:  Overweight woman in no acute distress. HEENT: Conjunctiva and lids normal, oropharynx clear. Neck: Supple, no carotid bruits, no elevated jugular venous pressure. Lungs: Clear to auscultation, nonlabored. Cardiac: Regular rate and rhythm, no significant systolic murmur or S3.Marland Kitchen Extremities: No  pitting.   Echocardiogram  Procedure date:  11/16/2008  Findings:      Mild LVH with LVEF 55-60%, pseudonormal diastolic filling pattern, mild left atrial enlargement, mitral annular calcification with mild mitral regurgitation, mildly thickened aortic leaflets with annular calcification, device wire in RV, mild tricuspid regurgitation, RVSP 31 mm mercury, trace to small pericardial effusion.  EKG  Procedure date:  12/30/2009  Findings:      Atrial paced rhythm at 60 beats per minute with nonspecific T wave changes.  PPM Specifications Following MD:  Hillis Range, MD     Referring MD:  Deshaun Schou PPM Vendor:  [employer]     PPM Model Number:  [id number]     PPM Serial Number:  [id number] PPM DOI:  04/08/2002     PPM Implanting MD:  Sherryl Manges, MD  Lead 1    Location: [facility]     DOI: 04/08/2002     Model #: [id number]     Serial #: [id number]     Status: active Lead 2    Location: RV     DOI: 04/08/2002     Model #: [id number]     Serial #: [id number]     Status: active  Magnet Response Rate:  BOL 98.6 ERI  86.3  Indications:  A-Flutter  Explantation Comments:  TTM's with [employer]  PPM Follow Up Pacer Dependent:  No      Parameters Mode:  DDDR     Lower Rate Limit:  60     Upper Rate Limit:  105 Paced AV Delay:  350     Sensed AV Delay:  275  Impression & Recommendations:  Problem # 1:  ATRIAL FIBRILLATION, PAROXYSMAL (ICD-427.31)  Quiescent with no significant palpitations. She continues on Coumadin with no reported bleeding problems. Electrocardiogram shows an atrial paced rhythm today. Remains on low-dose amiodarone. Will request recent blood work from Dr. Lysbeth Galas for review. I will see her back in 6 months.  Her updated medication list for this problem includes:    Aspirin 81 Mg Tabs (Aspirin) ..... One tablet by mouth once daily    Warfarin Sodium 5 Mg Tabs (Warfarin sodium) ..... One tablet by mouth once daily    Amiodarone Hcl 200 Mg Tabs (Amiodarone hcl) .Marland Kitchen... Take 1/2  tablet by mouth daily     Nadolol 40 Mg Tabs (Nadolol) .Marland Kitchen... Take one tablet by mouth daily    Metoprolol Succinate 50 Mg Xr24h-tab (Metoprolol succinate) .Marland Kitchen... Take one tablet by mouth as needed  Orders: EKG w/ Interpretation (93000)  Problem # 2:  HYPERTENSION, UNSPECIFIED (ICD-401.9)  Blood pressure well controlled today on present regimen.  Her updated medication list for this problem includes:    Aspirin 81 Mg Tabs (Aspirin) ..... One tablet by mouth once daily    Nadolol 40 Mg Tabs (Nadolol) .Marland Kitchen... Take one tablet by mouth daily    Metoprolol Succinate 50 Mg Xr24h-tab (Metoprolol succinate) .Marland Kitchen... Take one tablet by mouth as needed    Amlodipine Besylate 2.5 Mg Tabs (Amlodipine besylate) .Marland Kitchen... Take 1 tablet by mouth once a day  Patient Instructions: 1)  Your physician wants you to follow-up  in: 6 months. You will receive a reminder letter in the mail one-two months in advance. If you don't receive a letter, please call our office to schedule the follow-up appointment. 2)  We have called Dr. Joyce Copa office for a copy of your recent labs.

## 2010-08-09 ENCOUNTER — Encounter: Payer: Self-pay | Admitting: Internal Medicine

## 2010-08-09 DIAGNOSIS — I4891 Unspecified atrial fibrillation: Secondary | ICD-10-CM

## 2010-08-12 ENCOUNTER — Ambulatory Visit: Payer: Self-pay | Admitting: Cardiology

## 2010-08-24 LAB — CBC
HCT: 35.7 % — ABNORMAL LOW (ref 36.0–46.0)
Hemoglobin: 12.5 g/dL (ref 12.0–15.0)
MCHC: 35 g/dL (ref 30.0–36.0)
RDW: 13.6 % (ref 11.5–15.5)
WBC: 5 10*3/uL (ref 4.0–10.5)

## 2010-08-24 LAB — BASIC METABOLIC PANEL
Calcium: 9.1 mg/dL (ref 8.4–10.5)
GFR calc non Af Amer: >60 mL/min (ref >60)
Glucose, Bld: 102 mg/dL — ABNORMAL HIGH (ref 70–99)
Potassium: 4.2 mEq/L (ref 3.5–5.1)
Sodium: 140 mEq/L (ref 135–145)

## 2010-08-24 LAB — POCT URINALYSIS DIPSTICK
Glucose, UA: 100 mg/dL — AB
Nitrite: POSITIVE — AB
Urobilinogen, UA: 1 mg/dL (ref 0.0–1.0)

## 2010-08-24 LAB — APTT: aPTT: 33 seconds (ref 24–37)

## 2010-08-24 LAB — PROTIME-INR
INR: 2.08 — ABNORMAL HIGH (ref 0.00–1.49)
Prothrombin Time: 23.5 seconds — ABNORMAL HIGH (ref 11.6–15.2)

## 2010-08-24 LAB — SURGICAL PCR SCREEN: MRSA, PCR: NEGATIVE

## 2010-08-30 NOTE — Cardiovascular Report (Signed)
Summary: TTM   TTM   Imported By: Roderic Ovens 08/25/2010 15:46:56  _____________________________________________________________________  External Attachment:    Type:   Image     Comment:   External Document

## 2010-09-07 ENCOUNTER — Encounter: Payer: Self-pay | Admitting: *Deleted

## 2010-09-08 ENCOUNTER — Encounter: Payer: Self-pay | Admitting: Cardiology

## 2010-09-09 ENCOUNTER — Encounter: Payer: Self-pay | Admitting: Cardiology

## 2010-09-09 ENCOUNTER — Ambulatory Visit (INDEPENDENT_AMBULATORY_CARE_PROVIDER_SITE_OTHER): Payer: Medicare Other | Admitting: Cardiology

## 2010-09-09 VITALS — BP 110/75 | HR 65 | Ht 66.0 in | Wt 191.0 lb

## 2010-09-09 DIAGNOSIS — I10 Essential (primary) hypertension: Secondary | ICD-10-CM

## 2010-09-09 DIAGNOSIS — E785 Hyperlipidemia, unspecified: Secondary | ICD-10-CM

## 2010-09-09 DIAGNOSIS — I4891 Unspecified atrial fibrillation: Secondary | ICD-10-CM

## 2010-09-09 NOTE — Patient Instructions (Signed)
Your physician wants you to follow-up in: 6 months. You will receive a reminder letter in the mail one-two months in advance. If you don't receive a letter, please call our office to schedule the follow-up appointment. Your physician recommends that you continue on your current medications as directed. Please refer to the Current Medication list given to you today. 

## 2010-09-09 NOTE — Progress Notes (Signed)
Clinical Summary [name] is a 77 y.o.female presenting for routine followup. She was seen in November 2011 by Dr. Johney Frame. Device interrogation has been largely unremarkable. She describes only brief, occasional palpitations, nothing prolonged.  Reason followup labs from January showed iron 86, TIBC 439, sodium 142, potassium 4.4, BUN 13, creatinine 0.8, AST 17, ALT 14, hemoglobin 13.8, platelets 210, cholesterol 228, triglycerides 89, HDL 60, LDL 150, TSH 1.5.  Indicates no bleeding problems on Coumadin.   No Known Allergies  Current outpatient prescriptions:amiodarone (PACERONE) 200 MG tablet, Take 100 mg by mouth daily. , Disp: , Rfl: ;  amLODipine (NORVASC) 2.5 MG tablet, Take 2.5 mg by mouth daily.  , Disp: , Rfl: ;  aspirin 81 MG tablet, Take 81 mg by mouth daily.  , Disp: , Rfl: ;  bimatoprost (LUMIGAN) 0.03 % ophthalmic drops, 1 drop at bedtime.  , Disp: , Rfl:  calcium-vitamin D (OSCAL WITH D) 500-200 MG-UNIT per tablet, Take 2 tablets by mouth daily.  , Disp: , Rfl: ;  cyanocobalamin (,VITAMIN B-12,) 1000 MCG/ML injection, Inject 1,000 mcg into the muscle every 30 (thirty) days.  , Disp: , Rfl: ;  Glucosamine-Chondroit-Vit C-Mn (GLUCOSAMINE CHONDR 1500 COMPLX PO), Take 2 by mouth daily  , Disp: , Rfl: ;  metoprolol (TOPROL-XL) 50 MG 24 hr tablet, Take 50 mg by mouth daily.  , Disp: , Rfl:  Multiple Vitamin (MULTIVITAMIN) tablet, Take 1 tablet by mouth daily.  , Disp: , Rfl: ;  nadolol (CORGARD) 40 MG tablet, Take 40 mg by mouth daily.  , Disp: , Rfl: ;  rosuvastatin (CRESTOR) 10 MG tablet, Take 10 mg by mouth daily.  , Disp: , Rfl: ;  warfarin (COUMADIN) 5 MG tablet, Take 5 mg by mouth daily.  , Disp: , Rfl:   Past Medical History  Diagnosis Date  . Arthritis   . Glaucoma   . Diverticulosis   . GERD (gastroesophageal reflux disease)   . Mixed hyperlipidemia   . Essential hypertension, benign   . Colon polyps   . Atrial fibrillation     Paroxysmal  . Atrial flutter     s/p RFA  2003  . Pneumonia     7/03, with encephalopathy    Social History Ms. Neely reports that she has never smoked. She has never used smokeless tobacco. Ms. Rumler reports that she does not drink alcohol.  Review of Systems No fevers, chills, or unusual weight change. No chest pain, progressive shortness of breath, cough, hemoptysis, or wheezing. No dizziness, or syncope. No dysphasia or odynophagia. Stable appetite with no abdominal pain, melena, or hematochezia. No orthopnea, PND, or lower extremity edema. No focal motor weakness, memory problems, or speech deficits. Otherwise systems reviewed and negative except as already outlined.   Physical Examination Filed Vitals:   09/09/10 1052  BP: 110/75  Pulse: 65  Overweight woman in no acute distress. HEENT: Conjunctiva and lids normal, oropharynx clear. Neck: Supple, no carotid bruits, no elevated jugular venous pressure. Lungs: Clear to auscultation, nonlabored. Cardiac: Regular rate and rhythm, no significant systolic murmur or S3.Marland Kitchen Extremities: No pitting.   ECG Atrial paced rhythm at 60 beats per minute.   Problem List and Plan

## 2010-09-09 NOTE — Assessment & Plan Note (Signed)
Patient states that LDL has come down in followup with Dr. Lysbeth Galas.

## 2010-09-09 NOTE — Assessment & Plan Note (Signed)
No changes made in present medical regimen.

## 2010-09-09 NOTE — Assessment & Plan Note (Signed)
Symptomatically well controlled, only occasional brief palpitations. Rhythm is atrial paced today. Continue present regimen.

## 2010-09-18 LAB — COMPREHENSIVE METABOLIC PANEL WITH GFR
ALT: 43 U/L — ABNORMAL HIGH (ref 0–35)
ALT: 47 U/L — ABNORMAL HIGH (ref 0–35)
AST: 44 U/L — ABNORMAL HIGH (ref 0–37)
AST: 56 U/L — ABNORMAL HIGH (ref 0–37)
Albumin: 2.2 g/dL — ABNORMAL LOW (ref 3.5–5.2)
Albumin: 2.3 g/dL — ABNORMAL LOW (ref 3.5–5.2)
Alkaline Phosphatase: 43 U/L (ref 39–117)
Alkaline Phosphatase: 52 U/L (ref 39–117)
BUN: 11 mg/dL (ref 6–23)
BUN: 16 mg/dL (ref 6–23)
CO2: 22 meq/L (ref 19–32)
CO2: 25 meq/L (ref 19–32)
Calcium: 7.4 mg/dL — ABNORMAL LOW (ref 8.4–10.5)
Calcium: 7.7 mg/dL — ABNORMAL LOW (ref 8.4–10.5)
Chloride: 107 meq/L (ref 96–112)
Chloride: 110 meq/L (ref 96–112)
Creatinine, Ser: 0.91 mg/dL (ref 0.4–1.2)
Creatinine, Ser: 1.04 mg/dL (ref 0.4–1.2)
GFR calc non Af Amer: 51 mL/min — ABNORMAL LOW
GFR calc non Af Amer: 60 mL/min — ABNORMAL LOW
Glucose, Bld: 107 mg/dL — ABNORMAL HIGH (ref 70–99)
Glucose, Bld: 114 mg/dL — ABNORMAL HIGH (ref 70–99)
Potassium: 3.8 meq/L (ref 3.5–5.1)
Potassium: 3.8 meq/L (ref 3.5–5.1)
Sodium: 135 meq/L (ref 135–145)
Sodium: 139 meq/L (ref 135–145)
Total Bilirubin: 0.7 mg/dL (ref 0.3–1.2)
Total Bilirubin: 0.8 mg/dL (ref 0.3–1.2)
Total Protein: 5.1 g/dL — ABNORMAL LOW (ref 6.0–8.3)
Total Protein: 5.1 g/dL — ABNORMAL LOW (ref 6.0–8.3)

## 2010-09-18 LAB — CBC
HCT: 29.9 % — ABNORMAL LOW (ref 36.0–46.0)
HCT: 31 % — ABNORMAL LOW (ref 36.0–46.0)
HCT: 31.4 % — ABNORMAL LOW (ref 36.0–46.0)
HCT: 32.3 % — ABNORMAL LOW (ref 36.0–46.0)
Hemoglobin: 10.2 g/dL — ABNORMAL LOW (ref 12.0–15.0)
MCHC: 33.5 g/dL (ref 30.0–36.0)
MCHC: 34.1 g/dL (ref 30.0–36.0)
MCV: 96.2 fL (ref 78.0–100.0)
MCV: 97.2 fL (ref 78.0–100.0)
MCV: 97.5 fL (ref 78.0–100.0)
Platelets: 122 K/uL — ABNORMAL LOW (ref 150–400)
Platelets: 51 10*3/uL — ABNORMAL LOW (ref 150–400)
Platelets: 60 10*3/uL — ABNORMAL LOW (ref 150–400)
Platelets: 92 10*3/uL — ABNORMAL LOW (ref 150–400)
RBC: 3.11 MIL/uL — ABNORMAL LOW (ref 3.87–5.11)
RDW: 14.9 % (ref 11.5–15.5)
RDW: 14.9 % (ref 11.5–15.5)
RDW: 15.3 % (ref 11.5–15.5)
WBC: 4 10*3/uL (ref 4.0–10.5)
WBC: 4.3 10*3/uL (ref 4.0–10.5)
WBC: 5 K/uL (ref 4.0–10.5)

## 2010-09-18 LAB — CK TOTAL AND CKMB (NOT AT ARMC)
CK, MB: 1.5 ng/mL (ref 0.3–4.0)
Relative Index: 0.2 (ref 0.0–2.5)
Total CK: 612 U/L — ABNORMAL HIGH (ref 7–177)

## 2010-09-18 LAB — PROTIME-INR
INR: 3.1 — ABNORMAL HIGH (ref 0.00–1.49)
INR: 3.9 — ABNORMAL HIGH (ref 0.00–1.49)
INR: 4.4 — ABNORMAL HIGH (ref 0.00–1.49)
INR: 4.7 — ABNORMAL HIGH (ref 0.00–1.49)
Prothrombin Time: 42 s — ABNORMAL HIGH (ref 11.6–15.2)
Prothrombin Time: 46.1 s — ABNORMAL HIGH (ref 11.6–15.2)
Prothrombin Time: 48.7 s — ABNORMAL HIGH (ref 11.6–15.2)

## 2010-09-19 LAB — CBC
HCT: 32.1 % — ABNORMAL LOW (ref 36.0–46.0)
HCT: 32.8 % — ABNORMAL LOW (ref 36.0–46.0)
HCT: 35 % — ABNORMAL LOW (ref 36.0–46.0)
Hemoglobin: 11.1 g/dL — ABNORMAL LOW (ref 12.0–15.0)
Hemoglobin: 11.3 g/dL — ABNORMAL LOW (ref 12.0–15.0)
Hemoglobin: 11.3 g/dL — ABNORMAL LOW (ref 12.0–15.0)
Hemoglobin: 12 g/dL (ref 12.0–15.0)
MCHC: 33.7 g/dL (ref 30.0–36.0)
MCHC: 34.5 g/dL (ref 30.0–36.0)
MCHC: 34.5 g/dL (ref 30.0–36.0)
MCV: 96.7 fL (ref 78.0–100.0)
Platelets: 44 10*3/uL — CL (ref 150–400)
RBC: 3.34 MIL/uL — ABNORMAL LOW (ref 3.87–5.11)
RBC: 3.42 MIL/uL — ABNORMAL LOW (ref 3.87–5.11)
RBC: 3.62 MIL/uL — ABNORMAL LOW (ref 3.87–5.11)
RDW: 14 % (ref 11.5–15.5)
RDW: 14.6 % (ref 11.5–15.5)
WBC: 2.2 10*3/uL — ABNORMAL LOW (ref 4.0–10.5)
WBC: 3.5 10*3/uL — ABNORMAL LOW (ref 4.0–10.5)

## 2010-09-19 LAB — BASIC METABOLIC PANEL
CO2: 23 mEq/L (ref 19–32)
Calcium: 7.4 mg/dL — ABNORMAL LOW (ref 8.4–10.5)
Creatinine, Ser: 1.19 mg/dL (ref 0.4–1.2)
GFR calc Af Amer: 53 mL/min — ABNORMAL LOW (ref >60)
GFR calc non Af Amer: 44 mL/min — ABNORMAL LOW (ref >60)
Sodium: 138 mEq/L (ref 135–145)

## 2010-09-19 LAB — HEMOGLOBIN A1C
Hgb A1c MFr Bld: 5.8 % (ref 4.6–6.1)
Mean Plasma Glucose: 120 mg/dL

## 2010-09-19 LAB — TROPONIN I: Troponin I: 0.04 ng/mL (ref 0.00–0.06)

## 2010-09-19 LAB — PHOSPHORUS: Phosphorus: 3.1 mg/dL (ref 2.3–4.6)

## 2010-09-19 LAB — COMPREHENSIVE METABOLIC PANEL
ALT: 59 U/L — ABNORMAL HIGH (ref 0–35)
AST: 77 U/L — ABNORMAL HIGH (ref 0–37)
Alkaline Phosphatase: 61 U/L (ref 39–117)
BUN: 16 mg/dL (ref 6–23)
CO2: 24 mEq/L (ref 19–32)
CO2: 26 mEq/L (ref 19–32)
Calcium: 7.8 mg/dL — ABNORMAL LOW (ref 8.4–10.5)
Chloride: 101 mEq/L (ref 96–112)
Creatinine, Ser: 0.94 mg/dL (ref 0.4–1.2)
GFR calc Af Amer: >60 mL/min (ref >60)
GFR calc non Af Amer: 53 mL/min — ABNORMAL LOW (ref >60)
GFR calc non Af Amer: 58 mL/min — ABNORMAL LOW (ref >60)
Glucose, Bld: 111 mg/dL — ABNORMAL HIGH (ref 70–99)
Glucose, Bld: 123 mg/dL — ABNORMAL HIGH (ref 70–99)
Total Bilirubin: 0.9 mg/dL (ref 0.3–1.2)
Total Protein: 5.4 g/dL — ABNORMAL LOW (ref 6.0–8.3)

## 2010-09-19 LAB — HEPATITIS PANEL, ACUTE
HCV Ab: REACTIVE — AB
Hep A IgM: NEGATIVE
Hep B C IgM: NEGATIVE

## 2010-09-19 LAB — DIFFERENTIAL
Basophils Absolute: 0 10*3/uL (ref 0.0–0.1)
Basophils Relative: 0 % (ref 0–1)
Eosinophils Relative: 0 % (ref 0–5)
Eosinophils Relative: 0 % (ref 0–5)
Lymphocytes Relative: 29 % (ref 12–46)
Lymphocytes Relative: 41 % (ref 12–46)
Lymphocytes Relative: 43 % (ref 12–46)
Lymphs Abs: 0.9 10*3/uL (ref 0.7–4.0)
Lymphs Abs: 1 10*3/uL (ref 0.7–4.0)
Monocytes Absolute: 0.2 10*3/uL (ref 0.1–1.0)
Monocytes Absolute: 0.2 10*3/uL (ref 0.1–1.0)
Monocytes Relative: 7 % (ref 3–12)
Monocytes Relative: 7 % (ref 3–12)
Neutro Abs: 2.2 10*3/uL (ref 1.7–7.7)
Neutrophils Relative %: 64 % (ref 43–77)

## 2010-09-19 LAB — HEPATIC FUNCTION PANEL
ALT: 59 U/L — ABNORMAL HIGH (ref 0–35)
AST: 78 U/L — ABNORMAL HIGH (ref 0–37)
Albumin: 2.5 g/dL — ABNORMAL LOW (ref 3.5–5.2)
Bilirubin, Direct: 0.2 mg/dL (ref 0.0–0.3)
Total Bilirubin: 0.9 mg/dL (ref 0.3–1.2)

## 2010-09-19 LAB — CK TOTAL AND CKMB (NOT AT ARMC)
CK, MB: 3.4 ng/mL (ref 0.3–4.0)
CK, MB: 3.8 ng/mL (ref 0.3–4.0)
Relative Index: 0.8 (ref 0.0–2.5)

## 2010-09-19 LAB — POCT I-STAT, CHEM 8
BUN: 16 mg/dL (ref 6–23)
Chloride: 99 mEq/L (ref 96–112)
Sodium: 135 mEq/L (ref 135–145)

## 2010-09-19 LAB — CLOSTRIDIUM DIFFICILE EIA: C difficile Toxins A+B, EIA: NEGATIVE

## 2010-09-19 LAB — PROTIME-INR
INR: 2.6 — ABNORMAL HIGH (ref 0.00–1.49)
INR: 3.7 — ABNORMAL HIGH (ref 0.00–1.49)
Prothrombin Time: 25.1 seconds — ABNORMAL HIGH (ref 11.6–15.2)

## 2010-09-19 LAB — DIC (DISSEMINATED INTRAVASCULAR COAGULATION)PANEL
D-Dimer, Quant: 2.21 ug/mL-FEU — ABNORMAL HIGH (ref 0.00–0.48)
INR: 4.1 — ABNORMAL HIGH (ref 0.00–1.49)
Platelets: 49 10*3/uL — CL (ref 150–400)
Prothrombin Time: 43 seconds — ABNORMAL HIGH (ref 11.6–15.2)

## 2010-09-19 LAB — URINALYSIS, ROUTINE W REFLEX MICROSCOPIC
Leukocytes, UA: NEGATIVE
Nitrite: NEGATIVE
Protein, ur: 100 mg/dL — AB
Urobilinogen, UA: 1 mg/dL (ref 0.0–1.0)

## 2010-09-19 LAB — BRAIN NATRIURETIC PEPTIDE: Pro B Natriuretic peptide (BNP): 420 pg/mL — ABNORMAL HIGH (ref 0.0–100.0)

## 2010-09-19 LAB — CULTURE, BLOOD (ROUTINE X 2)

## 2010-09-19 LAB — APTT: aPTT: 52 seconds — ABNORMAL HIGH (ref 24–37)

## 2010-09-19 LAB — MAGNESIUM: Magnesium: 1.8 mg/dL (ref 1.5–2.5)

## 2010-09-27 ENCOUNTER — Other Ambulatory Visit: Payer: Self-pay | Admitting: Neurology

## 2010-09-27 DIAGNOSIS — R269 Unspecified abnormalities of gait and mobility: Secondary | ICD-10-CM

## 2010-09-27 DIAGNOSIS — G252 Other specified forms of tremor: Secondary | ICD-10-CM

## 2010-10-03 ENCOUNTER — Ambulatory Visit
Admission: RE | Admit: 2010-10-03 | Discharge: 2010-10-03 | Disposition: A | Discharge status: Home / Self Care | Payer: Medicare Other | Source: Ambulatory Visit | Attending: Neurology | Admitting: Neurology

## 2010-10-03 DIAGNOSIS — G250 Essential tremor: Secondary | ICD-10-CM

## 2010-10-03 DIAGNOSIS — R269 Unspecified abnormalities of gait and mobility: Secondary | ICD-10-CM

## 2010-10-21 ENCOUNTER — Ambulatory Visit (INDEPENDENT_AMBULATORY_CARE_PROVIDER_SITE_OTHER): Payer: Medicare Other | Admitting: *Deleted

## 2010-10-21 DIAGNOSIS — I4891 Unspecified atrial fibrillation: Secondary | ICD-10-CM

## 2010-10-21 NOTE — Progress Notes (Signed)
Pacer check in clinic  

## 2010-10-25 NOTE — Assessment & Plan Note (Signed)
[facility]                          [facility] OFFICE NOTE   NAME:[name], [name]                  MRN:          [id number]  DATE:12/03/2008                            DOB:          [birth year]    PRIMARY CARE PHYSICIAN:  Delaney Meigs, MD   ELECTROPHYSIOLOGIST:  Duke Salvia, MD, [facility]   REASON FOR VISIT:  Scheduled followup.   HISTORY OF PRESENT ILLNESS:  I last saw [name] in the office back  in October 2009.  She has subsequently followed up with Dr. Graciela Husbands in  February.  Interval history includes medication adjustments to address  breakthrough sensation of palpitations.  She has settled on nadolol as  outlined below with p.r.n. metoprolol use and states of fairly good  symptom control.  I also note that she was recently admitted to [facility] on the hospitalist service in early June and was  evaluated by Dr. Doyne Keel at that time.  She presented with atypical  chest pain and ruled out for myocardial infarction.  She was referred  for an echocardiogram, which revealed a left ventricular ejection  fraction of 55-60% with diastolic dysfunction, mild mitral  regurgitation, mild tricuspid regurgitation, and trace to small  pericardial effusion.  She also was referred for a Cardiolite, which was  overall low-risk demonstrating soft tissue attenuation and no frank  ischemic change.  She was discharged with a presumptive diagnosis of  musculoskeletal discomfort and she reports that her symptoms have  abated.  Electrocardiogram today in the office shows an atrial paced  rhythm at 70 beats per minute without magnet, ventricular paced rhythm  at 97 beats per minute with magnet.  She has occasional feeling of what  sounds like a premature ventricular complex and indeed some of these  were noted during her stress testing.  She has had no dizziness or  syncope.  Coumadin continues on the direction of Dr. Lysbeth Galas.   ALLERGIES:  No known drug allergies.   PRESENT MEDICATIONS:  1. Aspirin 81 mg p.o. daily.  2. Coumadin as directed by Dr. Lysbeth Galas to achieve a therapeutic INR of      2.0-3.0.  3. Multivitamin once daily.  4. Os-Cal with vitamin D 500 mg p.o. daily.  5. Glucosamine daily.  6. Zetia 10 mg p.o. daily.  7. Amiodarone 200 mg p.o. daily.  8. B12 injections monthly.  9. Nadolol 40 mg p.o. daily.  10.Lumigan eye drops nightly.  11.Metoprolol 50 mg p.r.n.   REVIEW OF SYSTEMS:  Outlined above.  Otherwise, reviewed and negative.   PHYSICAL EXAMINATION:  VITAL SIGNS:  Blood pressure is 160/64, heart  rate is 59 and regular, weight is 204 pounds.  GENERAL:  The patient is comfortable in no acute distress.  HEENT:  Conjunctivae normal.  Oropharynx clear.  NECK:  Supple.  No elevated jugular venous pressure.  No loud bruits.  LUNGS:  Clear without labored breathing.  CARDIAC:  Regular rate and rhythm.  No loud murmur or S3 gallop.  ABDOMEN:  Soft, nontender.  EXTREMITIES:  Exhibit no frank pitting edema.   Recent laboratory data showed  normal troponin-I levels, BNP of 617.  Sodium 140, potassium 3.7, BUN 12, creatinine 0.87, AST 23, ALT 19,  hemoglobin 11.8.   IMPRESSION AND RECOMMENDATIONS:  1. Paroxysmal atrial fibrillation, fairly well symptom controlled on      amiodarone and nadolol.  She continues on Coumadin under the      direction of Dr. Lysbeth Galas to achieve a therapeutic INR of 2.0-3.0 for      stroke prophylaxis.  No specific medication changes were made      today.  2. History of atrial flutter radiofrequency ablation, quiescent.  3. Hyperlipidemia, on Zetia, followed by Dr. Lysbeth Galas.  4. From an ischemic perspective, recent evaluation including an      echocardiogram and Cardiolite were reassuring as outlined above.      Cardiac markers were normal during hospital observation in June      with atypical chest pain.     Jonelle Sidle, MD  Electronically Signed     SGM/MedQ  DD: 12/03/2008  DT: 12/03/2008  Job #: [id number]   cc:   Delaney Meigs, M.D.  Duke Salvia, MD, [facility]

## 2010-10-25 NOTE — Assessment & Plan Note (Signed)
[facility]                          [facility] OFFICE NOTE   NAME:[name], [name]                  MRN:          [id number]  DATE:04/06/2008                            DOB:          10/22/1928    PRIMARY CARE PHYSICIAN:  Delaney Meigs, MD.   ELECTROPHYSIOLOGIST:  Duke Salvia, MD, [facility].   REASON FOR VISIT:  Paroxysmal atrial fibrillation.   HISTORY OF PRESENT ILLNESS:  [name] has been followed most  recently by Dr. Graciela Husbands and Dr. Andee Lineman.  She has a history of atrial  flutter status post radiofrequency ablation as well as paroxysmal atrial  fibrillation complicated by symptomatic bradycardia and requiring  placement of a [employer]. Jude device in the past.  She is on chronic Coumadin  and amiodarone therapy and generally has done fairly well, although has  been bothered by intermittent rapid palpitations over the last few  weeks.  She has been using p.r.n. metoprolol with some improvement  actually and reports feeling better over the last week.  She had an  ischemic evaluation in December 2007 via Cardiolite demonstrating a  normal left ventricular ejection fraction of 66% and no abnormal  electrocardiographic or perfusion imaging to suggest ischemia.  She is  not reporting any angina at this time.  She does experience a vague left-  sided chest discomfort, specifically when she experiences rapid  palpitations, but otherwise does not.  She is having no bleeding  problems and has her Coumadin follows in [facility].   ALLERGIES:  No known drug allergies.   PRESENT MEDICATIONS:  1. Aspirin 81 mg p.o. daily.  2. Coumadin as instructed by the [facility].  3. Womens once daily.  4. Multivitamin.  5. Os-Cal with vitamin D 500 mg p.o. daily.  6. Glucosamine.  7. Pacerone 100 mg p.o. daily except for 200 mg p.o. daily on Friday,      Saturday, and Sunday.  8. Zetia 10 mg p.o. daily.   REVIEW OF SYSTEMS:  As described in  the history of present illness,  otherwise negative.   PHYSICAL EXAMINATION:  VITAL SIGNS:  Blood pressure today is 118/80,  heart rate is 64 and regular, and weight is 202 pounds.  GENERAL:  The patient is comfortable, in no acute distress.  NECK:  Reveals no elevated jugular venous pressure.  No loud bruits.  LUNGS:  Clear without labored breathing at rest.  CARDIAC:  Regular rate and rhythm.  No pathologic murmur or S3 gallop.  EXTREMITIES:  Exhibit no frank pitting edema.   There is some chronic venous insufficiency noted.   IMPRESSION AND RECOMMENDATIONS:  1. Paroxysmal atrial fibrillation with history of symptomatic      bradycardia, status post pacemaker placement.  She continues on      Coumadin, amiodarone, and p.r.n. Lopressor.  She has had some      intermittent rapid palpitations over the last few weeks, although      this seems to have settled down within the last week on the current      regimen.  I did not make any specific changes today.  She has a      device follow up with Dr. Graciela Husbands as already outlined and I will      otherwise, plan to see her back in the next 6 months assuming she      remains symptomatically stable.  She had an ischemic workup within      the last 2 years and is not reporting any angina at this time.  2. History of atrial flutter, status post radiofrequency ablation.  3. Hyperlipidemia, on Zetia.  This has been followed by Dr. Lysbeth Galas.     Jonelle Sidle, MD  Electronically Signed    SGM/MedQ  DD: 04/06/2008  DT: 04/07/2008  Job #: [id number]   cc:   Delaney Meigs, M.D.  Duke Salvia, MD, [facility]

## 2010-10-25 NOTE — Discharge Summary (Signed)
[name], [name]           ACCOUNT NO.:  [id number]   MEDICAL RECORD NO.:  [id number]          PATIENT TYPE:  INP   LOCATION:  1410                         FACILITY:  [facility]   PHYSICIAN:  Marcellus Scott, MD     DATE OF BIRTH:  July 30, 1928   DATE OF ADMISSION:  12/07/2008  DATE OF DISCHARGE:  12/13/2008                               DISCHARGE SUMMARY   PRIMARY MEDICAL DOCTOR:  Delaney Meigs, MD   PRIMARY CARDIOLOGIST:  Duke Salvia, MD, [facility]   DISCHARGE DIAGNOSES:  1. Health care acquired pneumonia.  2. Coagulopathy secondary to Coumadin.  No bleeding.  3. Pancytopenia, improved.  4. Mild hepatitis and positive hepatitis C antibody.  LFTs improving.  5. Paroxysmal atrial fibrillation, on chronic Coumadin.  6. Hypertension.  7. Dyslipidemia.  8. Splenomegaly.   DISCHARGE MEDICATIONS:  1. Aspirin 81 mg p.o. daily.  2. Multivitamin one p.o. daily.  3. Os-Cal 500 plus vitamin D one p.o. daily.  4. Amiodarone 200 mg p.o. daily.  5. Nadolol 40 mg p.o. daily.  6. Lumigan eye drops 1 drop in each eye daily.  7. Coumadin reduced to 2.5 mg p.o. daily.  8. Zetia 10 mg p.o. daily.  9. Metoprolol 50 mg p.o. q.h.s. p.r.n.  10.Augmentin 875 p.o. b.i.d.  11.Cipro 500 mg p.o. b.i.d.   PROCEDURES:  1. Ultrasound abdomen.  Impression - surgically absent gallbladder      without biliary obstruction.  No focal liver lesion.  Splenomegaly.  2. Chest x-ray on June 28.  Impression - patchy density in the lower      lobes, left more than the right, worrisome for pneumonia.  3. CT of the head without contrast on June 28.  Impression - no acute      intracranial abnormalities.   PERTINENT LABS:  INR today 3.1.  CBC - hemoglobin 10.2, hematocrit 29.9,  white blood cells 5, platelets 122.  Basic metabolic panel on July 2  unremarkable with BUN 11, creatinine 0.91.  LFTs with total protein 5.1,  albumin 2.2, AST 44, ALT 43, total bilirubin 0.7, alkaline phosphatase  43.  CK on July 1  612.  DIC panel on June 30 with platelets 49, no  schistocytes on smear, INR 4.1 (the patient was on Coumadin), fibrinogen  456, D-dimer 2.21.  Acute hepatitis panel with positive hepatitis C  antibody.  Blood cultures from June 28 are no growth to date.  Ammonia  9.  C. difficile toxin negative.  Cardiac enzymes with negative  troponin.  Vitamin B12 614, TSH 0.980, hemoglobin A1c 5.89.  Urinalysis  not suggestive of urinary tract infection.  BNP of 420.   CONSULTATIONS:  None.   HOSPITAL COURSE AND PATIENT DISPOSITION:  [name] is a very  pleasant 77 year old Caucasian female patient with history of paroxysmal  atrial fibrillation on chronic Coumadin who has had problems with  coagulopathy in the past where she had to hold her Coumadin for a few  days, dyslipidemia, hypertension.  She had recently been hospitalized at  the [facility] when extensive cardiac workup was done  and Cardiolite stress test had  shown diastolic dysfunction, otherwise  negative.  She was now brought to the emergency room with generalized  weakness, low-grade fever and altered mental status and slurred speech  of a week's duration.  She had some loose stools, cough, sore throat and  dyspnea on exertion.  Evaluation revealed a temperature of 99.2 degrees  Fahrenheit, pulse rate of 115 per minute, white blood cells of 2.2,  platelets 44, AST 77, ALT 69 and INR of 2.1 on admission.  She was then  admitted to the hospital for health care acquired pneumonia.  1. Health care acquired pneumonia.  The patient's cultures were sent      off and she was empirically placed on vancomycin and Zosyn, oxygen,      bronchodilator nebulizations, Mucinex and Robitussin as needed.      The patient has no difficulty swallowing.  With these measures the      patient steadily improved following which she was switched to oral      antibiotics after approximately 72 hours.  She continued to do      well.  She denies  any cough, dyspnea.  She has been afebrile.  Her      pancytopenia has steadily improved.  Her lung findings have also      improved.  Recommend completing a week's course of antibiotics and      followup chest x-ray in a couple of weeks to ensure resolution.  2. Pancytopenia.  This was probably secondary to her acute illness.      She had low platelets in the low 40s, white blood cell in the      2000s.  DIC panel although abnormal was not suggestive of DIC.  She      did not have bleeding.  Her CBCs have steadily improved. Recommend      followup her CBCs as an outpatient.  3. An incidental finding of splenomegaly was found on ultrasound of      the abdomen.  If the patient continues to have either mono or      pancytopenia consider outpatient hematology consult to evaluate for      other causes for her splenomegaly and hematological abnormalities.  4. Coagulopathy.  This was most likely secondary to her acute medical      illness, antibiotics and amiodarone complicating Coumadin.  The      patient has not received any Coumadin since June 29.  Today her INR      is better compared to in the last couple of days.  Apparently she      has had more greens in her diet brought by her family yesterday.      Will hold Coumadin today per pharmacy recommendations and continue      on half the dose of her previous home dose of Coumadin, that is 2.5      mg p.o. daily.  Home health RN will draw her blood for INR, CBC and      CMET on December 15, 2008, to be relayed to her primary medical doctor      for Coumadin dose adjustment.  5. Transaminitis or mild hepatitis.  Again, this may be secondary to      her acute illness.  Zetia was held.  Ultrasound of the liver was      negative for any cause for this.  Her hepatitis C antibody is      positive.  However, her LFTs are near normal at this time.  Will      resume her Zetia and plan to follow her complete metabolic panel as      an outpatient.  Consider  further evaluation of hepatitis C as an      outpatient.  6. Paroxysmal atrial fibrillation.  The patient has been rate      controlled.  7. Encephalopathy/altered mental status on admission: likely due to      Pneumonia, resolved.   DISPOSITION:  Physical therapy and occupational therapy had seen the  patient and had initially recommended short term SNF if agreeable.  The  patient and family, however, declined this offer and would like to take  the patient home.  The patient will have 24 hour care available at home  and hence will get home health PT, OT, RN and three in one.  The patient  at this time is stable for discharge home.  She is advised to follow up  with her primary MD in a week's time.   FOLLOWUP RECOMMENDATIONS:  1. Repeat CBC, INR and CMET on December 15, 2008, by home health RN and      results to be given to Dr. Lysbeth Galas for further Coumadin dose      adjustment.  2. The patient to see Dr. Lysbeth Galas in 1 week from discharge with repeat      CBC, INR and CMET.  3. The patient to call Dr. Odessa Fleming office for followup appointment.   Time taken in coordinating this discharge is 40 minutes.      Marcellus Scott, MD  Electronically Signed     AH/MEDQ  D:  12/13/2008  T:  12/13/2008  Job:  [id number]   cc:   Duke Salvia, MD, [facility]  [address]  [address]  [place]  [name] [address]   Delaney Meigs, M.D.  Fax: [phone]

## 2010-10-25 NOTE — Assessment & Plan Note (Signed)
[facility]                         ELECTROPHYSIOLOGY OFFICE NOTE   NAME:Yordy, AYZA RIPOLL                  MRN:          [id number]  DATE:01/09/2007                            DOB:          [birth year]    Ms. Glaus returns today for followup.  She is a very pleasant woman  with a history of atrial flutter with 1:1 conduction, atrial  fibrillation, symptomatic bradycardia status post pacemaker, and  coronary artery disease.  She returns today for followup.  She denies  chest pain or shortness of breath.  She has otherwise been stable.  She  has rare palpitations.   PHYSICAL EXAMINATION:  GENERAL:  On physical exam, she is a pleasant,  well-appearing elderly woman in no distress.  VITAL SIGNS:  The blood pressure was 140/90, pulse 64 and regular,  respirations 18.  Weight 203 pounds.  NECK:  No jugular venous distension.  LUNGS:  Clear bilaterally to auscultation.  No wheezing, rhonchi, or  rales.  CARDIOVASCULAR:  Regular rate and rhythm with normal S1 and S2.  EXTREMITIES:  Exam demonstrated some venous insufficiency changes.   Interrogation of her pacemaker demonstrates a St. Jude Integrity with P  waves of 0.6, R waves 13.  Impedance was 476 in the atrium, 550 in the  ventricle.  Threshold voltage 0.5 in the atrium, 0.6 at 0.5 in the right  ventricle.  Battery voltage was 2.76 volts.  There were 12 mode  switches, none of which were over a minute.   MEDICATIONS:  1. Aspirin 81 a day.  2. Coumadin, as directed.  3. Pacerone 200 mg 3 days a week.   IMPRESSION:  1. Symptomatic bradycardia.  2. Status post flutter ablation.  3. Atrial fibrillation.  4. Chronic amiodarone therapy.   DISCUSSION:  Today, I have asked Ms. Needle to take 100 mg Monday  through Thursday of her amiodarone and 200 mg Friday, Saturday, and  Sunday.  She will continue on with her present medical therapy otherwise  and follow back up with [place] in the office in 1  year.     Doylene Canning. Ladona Ridgel, MD  Electronically Signed    GWT/MedQ  DD: 01/09/2007  DT: 01/10/2007  Job #: [id number]

## 2010-10-25 NOTE — Cardiovascular Report (Signed)
[facility]                   [place] ELECTROPHYSIOLOGY DEVICE CLINIC NOTE   NAME:[name], [name]                  MRN:          [id number]  DATE:08/06/2008                            DOB:          01-Sep-1928    [name] is seen in followup for atrial fibrillation and  bradycardia.  She has had problems with intermittent palpitations.  We  have tried to decrease her amiodarone and in a conversation with Dr.  Ladona [name] the amiodarone was increased back to 200 mg a day.  She continues  to have episodes of tachy palpitations.  She also has episodes where she  feels quite washed out and drained; it is her impression that this  correlates with hypertension and is further her impression that this  frequently but not necessarily always correlates with her atrial  fibrillation.   She says that Dr. Lysbeth [name] is tracking her amiodarone surveillance  laboratories.   Her medications include:  1. Aspirin 81.  2. Warfarin.  3. Amiodarone 200.  4. Vitamin B12 recently initiated.   On examination, her blood pressure was 154/79 today and her pulse was  59.  Her weight was 199.  Her lungs were clear.  The neck veins were 7.  Her heart sounds were regular without murmurs or gallops.  The abdomen  was soft.  The extremities had 1+ edema.   Interrogation of her St. Jude pulse generator demonstrates a P-wave of  0.5 with impedance of 426, a threshold of 1 V at 0.5, the R-wave was  12.5 with impedance of 491, a threshold 0.7 at 5.5.  Battery voltage  2.76.  She has had 83 episodes of atrial fibrillation of which 50 are  less than 6 minutes and 9 of which are greater than 1 hour.   IMPRESSION:  1. Paroxysmal atrial fibrillation.  2. Bradycardia status post pacemaker implantation.  3. Amiodarone for paroxysmal atrial fibrillation.  4. Hypertension and perhaps correlating with her atrial fibrillation.   I have asked that [name] try to sort out whether her  atrial  fibrillation and her blood pressure elevations correlate.  To this end,  she is going to use the LED light on her blood pressure machine to see  whether it is regular and irregular, whether it tracks with her pulse.   In the interim, I have also given her prescriptions for atenolol 50,  Toprol 50, and nadolol 40 to take each for 2 weeks to see if we can find  a beta-blocker that is tolerated.  In the event that we can, this may  help with her paroxysms of atrial fibrillation because it sounds like  tachy palpitations are the problem.  It may also help with her blood  pressure.  This does not address the issue of long-term outcomes with  hypertension, but I think for right now symptom relief is the primary  goal.  Perhaps adjunctive antihypertensive therapy for morbidity outcome  benefit would be appropriate.   We will see her again in 6 months' time, and she is to call us in the  interim if she has problems.      [name] Salvia, MD,  [facility]  Electronically Signed    SCK/MedQ  DD: 08/06/2008  DT: 08/07/2008  Job #: [id number]   cc:   [name], M.D.

## 2010-10-25 NOTE — H&P (Signed)
[name], [name]           ACCOUNT NO.:  [id number]   MEDICAL RECORD NO.:  [id number]          PATIENT TYPE:  EMS   LOCATION:  ED                           FACILITY[facility]  [facility]   PHYSICIAN:  Richarda Overlie, MD       DATE OF BIRTH:  12-11-1928   DATE OF ADMISSION:  12/07/2008  DATE OF DISCHARGE:                              HISTORY & PHYSICAL   PRIMARY CARE PHYSICIAN:  Dr. Hart Carwin.   PRIMARY CARDIOLOGIST:  Duke Salvia, MD, [facility]   SUBJECTIVE:  This is a 77 year old female with a history of paroxysmal  atrial fibrillation and dyslipidemia who presents to the emergency room  with chief complaint of generalized weakness.  The patient apparently  has had a low grade fever with altered mental status and slurred speech  for the last 1 week.  The patient is alert and oriented in the emergency  room.  The patient was hospitalized at [facility] the  beginning of June by Dr. Doyne Keel for atypical chest pain and rule-out  myocardial infarction.  She had a complete cardiovascular workup done  and had a echocardiogram and a Cardiolite stress test, which were showed  some diastolic dysfunction, otherwise, negative.  The patient was also  found to have tachycardia for which cardiotropic medications were  adjusted.  She has a history of paroxysmal atrial fibrillation.   As of today, the patient is found to have a low grade fever of 99.2 in  the emergency room,  generalized weakness, a couple of loose bowel  movements every day for the last 1 week, and she also  complains of  cough, congestion, sore throat, and shortness of breath with exertion.  The patient has been quite sedentary and lethargic since her discharge  from [facility].  She was  evaluated with Dr. Diona Browner at the  [facility] following her discharge because of her paroxysmal  atrial fibrillation, and was found to be therapeutic on her INR  and was  recommended to  continue with her amiodarone and  nadolol.  Denies any  chest pain, orthopnea, paroxysmal nocturnal dyspnea, or dependent edema   PAST MEDICAL HISTORY:  1. Paroxysmal atrial fibrillation.  2. Also remarkable for lumpectomy of the right breast.  3. Previous gallbladder surgery.  4. Hypertension.  5. History of thrombophlebitis in her legs.  6. Atrial flutter/atrial fibrillation, on anticoagulation.   ALLERGIES:  NO KNOWN DRUG ALLERGIES.   SOCIAL HISTORY:  Patient is a nonsmoker, nonalcoholic.   SURGICAL HISTORY:  Dual chamber pacemaker placement in October 2003   CURRENT MEDICATIONS:  1. Aspirin 81 mg p.o. daily.  2. Coumadin 5 mg p.o. daily.  3. Multivitamin 1000 p.o. daily.  4. Os-Cal with vitamin D 500 mg p.o. daily.  5. Glucosamine daily.  6. Zetia 10 mg p.o. daily.  7. Amiodarone 200 mg p.o. daily.  8. Vitamin B12 injections monthly.  9. Nadolol 40 mg p.o. daily.  10.Lumigan eye drops nightly.  11.Metoprolol 50 mg p.r.n. nighttime.   PHYSICAL EXAMINATION:  VITAL SIGNS:  Blood pressure 130/68 pulse 115,  respirations 21, temperature 99.2.  GENERAL:  In  general, the patient appears to be comfortable, currently  in no acute distress, alert and oriented to time and place.  HEENT:  Pupils equal, round, and reactive.  Extraocular movements  intact.  NECK:  Supple without any JVD, no carotid bruits.  LUNGS:  Bibasilar crackles at the bases, left greater than right.  Dullness to percussion at the left base.  No accessory muscle use.  CARDIOVASCULAR:  Regular rate and rhythm.  No appreciable murmurs, rubs  or gallops.  ABDOMEN:  Soft, nontender, nondistended.  EXTREMITIES:  Without cyanosis, clubbing , or edema.   LABORATORY DATA:  BNP 420, troponin, 0.04, sodium 134, potassium 3.6,  chloride 101, bicarb 26, glucose 111, BUN 16, creatinine 0.94, total  bilirubin 0.9.  Alk phos 84, AST 77, ALT 69, INR 2.1.  CBC:  WBC 2.2,  hemoglobin 12.0, hematocrit 35.0, platelets 144.   ASSESSMENT AND PLAN:  1.  Pneumonia.  Hospital acquired  The patient was recently      hospitalized at [facility].  The patient will be      started on broad-spectrum antibiotics, mainly vancomycin and Zosyn      for both gram positive and gram negative coverage.  Blood cultures      have been obtained.  The patient will be started on nebulizer      treatments and Atrovent, and albuterol, and Mucinex and Robitussin      as needed.  2. Thrombocytopenia.  CBC will be repeated.  This could be      pseudothrombocytopenia .We will check a vitamin B12 level ,  as B      12 deficinecy can cause thombocytopenia ,but patient does not have      any stigmata of low platelets such as  petechiae  .  If the      patient's platelet count remains low, then we will consider      obtaining a hematology/oncology consultation.  3. History of paroxysmal atrial fibrillation.  Currently the patient's      rate is controlled.  We will continue the patient on amiodarone and      nadolol.  4. Mildly elevated AST and ALT.  We will try to obtain labs during her      prior hospitalization for comparison.  Patient does not have a      history of any alcohol use or any recent drug use.  Amiodarone can      also cause increase  AST and ALT, therefore; this needs to be kept      in mind.  5. Leukopenia.  This is likely in the setting of the patient's      sepsis/pneumonia.  6. Dyslipidemia.  Continue with Zetia.  7. The patient is a full code.      Richarda Overlie, MD  Electronically Signed    NA/MEDQ  D:  12/07/2008  T:  12/07/2008  Job:  [id number]

## 2010-10-28 NOTE — H&P (Signed)
[facility]  Patient:    [name], [name] Visit Number: [id number] MRN: [id number]          Service Type: MED Location: 3700 [id number] Attending Physician:  Norman Clay Dictated by:   Darden Palmer., M.D. Admit Date:  12/23/2001   CC:         Colon Flattery, D.O.   History and Physical  HISTORY OF PRESENT ILLNESS:  This very nice 77 year old female is seen for cardiologic evaluation of atrial fibrillation.  She has a prior history of paroxysmal atrial fibrillation dating back to 6.  She has been evaluated over the years and has been on verapamil and is also thought to have some element of supraventricular tachycardia.  She had episodes of fatigue several years ago and she was on Calan 240 mg twice a day and this was eventually reduced to 120 mg once a day because of some bradycardia and fatigue.  She has continued to have some episodes of SVT.  She had recurrent tachycardia and has had some sinus bradycardia and some fatigue.  Dr. Doylene Canning. Ladona Ridgel saw the patient in 2002 for consideration of ablation of a supraventricular tachycardia.  She was found to have a dual A-V node physiology but no inducible AVNRT and did have recurrent inducible atrial fibrillation requiring ______ and D-C cardioversion in the EP lab to terminate.  She had the onset of palpitations this morning at approximately 8 oclock.  She felt an irregularity in her heart beat and the symptoms persisted and she had some vague tightness in her upper chest as well as some sharp chest pains lasting a few seconds.  They were not related to exertion and were not associated with sweating, but her heart has persisted out of rhythm and she came to the emergency room.  She reported having had gallbladder surgery last Wednesday that went well.  Other than this, there have been no changes in her medicine and no changes in her overall status.  She has complained of a  little bit more shortness of breath than usual recently and has noted some fatigue and diminished exercise capacity.  PAST MEDICAL HISTORY:  Her past history is remarkable for a lumpectomy in her right breast, cyst removed from her right chest, previous gallbladder surgery last week.  She has no history of hypertension.  She has a history of thrombophlebitis in her legs.  No history of diabetes.  ALLERGIES:  None.  CURRENT MEDICATIONS: 1. Lipitor 10 mg daily. 2. Fosamax weekly. 3. Aspirin daily. 4. Calan 120 mg daily.  FAMILY HISTORY:  Father died at age 66 of lung cancer.  Her mother is still living.  She has four brothers.  One brother died of cancer of the liver.  Two sisters are healthy.  She has four sons who are healthy.  Family history is positive for cancer.  There is no premature heart disease within the family.  SOCIAL HISTORY:  She is a retired Youth worker.  Her husband died a couple of years ago.  She is a nonsmoker and does not use alcohol to excess. Her faith is important to her.  REVIEW OF SYSTEMS:  Her weight has been stable.  There have been no skin changes.  She wears glasses but otherwise has no eye, ear, nose or throat symptoms.  No difficulty swallowing.  Some mild abdominal discomfort since her previous surgery but the abdominal discomfort she had prior to the surgery has largely  resolved.  No diarrhea or constipation.  No hemoptysis or other pulmonary symptoms.  No urinary urgency, hesitancy, frequency or other urinary symptoms.  No claudication.  Does have history of phlebitis involving her legs, has been modestly obese over the years.  Other than as noted above, the remainder of the review of systems is unremarkable.  PHYSICAL EXAMINATION:  GENERAL:  She is an obese, pleasant woman who weighs 200 pounds.  VITAL SIGNS:  Blood pressure is currently 124/80 lying in the ER.  SKIN:  Her skin is warm and dry without obvious mass or lesions.  There  are changes of varicosities in the lower extremities.  ENT:  EOMI.  PERRLA.  C&S clear.  Wears glasses.  Fundi unremarkable.  Pharynx negative.  NECK:  Supple without masses, JVD, thyromegaly or bruits.  BREASTS:  Not examined.  LYMPH NODES:  Normal.  LUNGS:  Clear to A&P.  CARDIAC:  Irregular rhythm.  Normal S1 and S2.  No murmur.  ABDOMEN:  Her abdomen is mildly and diffusely tender following her recent surgery.  No gross masses are noted.  Incisions appear to be healed in well.  RECTAL:  Deferred due to cardiac condition.  GU:  Deferred due to cardiac condition.  EXTREMITIES:  Femoral pulses are 2+.  Peripheral pulses are 2+.  Moderate venous varicosities are noted.  No edema noted.  NEUROLOGIC:  Normal.  LABORATORY AND ACCESSORY DATA:  Twelve-lead electrocardiogram shows atrial fibrillation with a rapid response, probable lead reversal.  Chest x-ray was unremarkable.  IMPRESSION: 1. Atrial fibrillation, likely paroxysmal. 2. Obesity. 3. Recent abdominal surgery.  RECOMMENDATIONS:  The patient will be brought into the hospital and placed on intravenous diltiazem.  MI will be ruled out with serial CPKs and EKGs because of the previous chest pain that she had.  EKG is certainly non-acute right now.  Control rate with IV diltiazem, institute flecainide for suppression of atrial fibrillation.  Check TSH.  Check echocardiogram. Dictated by:   Darden Palmer., M.D. Attending Physician:  Norman Clay DD:  12/23/01 TD:  12/25/01 Job: [id number]

## 2010-10-28 NOTE — Discharge Summary (Signed)
[place]. [facility]  Patient:    [name], [name] Visit Number: [id number] MRN: [id number]          Service Type: MED Location: 3700 3730 [id number] Attending Physician:  Norman Clay Dictated by:   Darden Palmer., M.D. Admit Date:  12/23/2001 Discharge Date: 12/25/2001   CC:         Colon Flattery, D.O.   Discharge Summary  FINAL DIAGNOSES: 1. Paroxysmal atrial fibrillation, resolved. 2. Atypical chest pain, resolved. 3. Obesity. 4. Recent gallbladder surgery.  PROCEDURE:  Echocardiogram.  HISTORY:  This 77 year old female has a history of paroxysmal atrial fibrillation.  She has had several attacks of paroxysmal atrial fibrillation and is also felt to have sick sinus syndrome.  She had gallbladder surgery four days prior to admission and developed rapid heartbeat associated with some vague chest pain this morning.  The chest pain resolved but the heart rate persisted and she presented to the emergency room and was admitted with paroxysmal atrial fibrillation.  Please see the previously dictated history and physical for the remainder of the details.  HOSPITAL COURSE:  Lab data on admission showed normal CPK and troponin. Chemistry panel was normal except for an albumin of 2.9.  PT and PTT were normal.  CBC was normal.  EKG showed atrial fibrillation with lead reversal of the arm leads and nonspecific ST abnormality.  Chest x-ray was normal.  The patient was placed on intravenous Diltiazem and she was started on flecainide. Verapamil was withheld.  She converted to sinus rhythm.  She was placed on Lovenox.  MI was ruled out.  The next day, she had some bradycardia down to heart rates of 40 and the flecainide dose reversed.  Because of her bradycardic, it was deemed necessary to keep her in the hospital an additional day.  She did not have severe bradycardia following this and was discharged home the next day in improved condition.   She is instructed to stop her Verapamil and we will discharge her on a combination of aspirin 325 daily and flecainide 50 b.i.d., Fosamax 70 mg weekly, and Lipitor daily.  She is to follow up with me in one week and is to call if there are problems. Dictated by:   Darden Palmer., M.D. Attending Physician:  Norman Clay DD:  12/25/01 TD:  12/28/01 Job: [id number]

## 2010-10-28 NOTE — Discharge Summary (Signed)
   NAME:  [name], [name]                     ACCOUNT NO.:  [id number]   MEDICAL RECORD NO.:  [id number]                   PATIENT TYPE:  INP   LOCATION:  3730                                 FACILITY:  [facility]   PHYSICIAN:  Olga Millers, MD [facility]              DATE OF BIRTH:  May 12, 1929   DATE OF ADMISSION:  04/02/2002  DATE OF DISCHARGE:  04/03/2002                                 DISCHARGE SUMMARY   PRIMARY DISCHARGE DIAGNOSIS:  Atrial flutter.   HISTORY OF PRESENT ILLNESS:  This is a 77 year old female with a history of  supraventricular tachycardia and atrial fibrillation.  She has had recurrent  atrial fibrillation for 10 years, has been more frequent since September.  She was recently restarted on flecainide.  The patient was recently  switching physicians from Dr. Tresa Endo to Dr. Diona Browner; however, in the last 1-  1/2 months, she had three episodes.  They usually last for 4 hours.  The  episode during admission lasted 4 hours, felt fluttering in her chest.  At  times she felt somewhat presyncopal, although she has had mild chest  discomfort in the past with this as well.   In the emergency room, her rates were noted to be 180.  She was treated with  Cardizem with a slower ventricular response to persistent atrial  fibrillation.  She denies any recent symptoms such as fevers, chills, or  cough.  She is otherwise active and cannot induce any cardiovascular  symptoms with activity.   The patient was admitted to the emergency room in [place]. She was started on  heparin and Coumadin.  The patient was transferred to [facility]  and seen in consultation by Dr. Sharrell Ku.  The patient was seen in the  past and hand an EEG study which showed dual-node A-V physiology but no  AVNRT.  The patient was restarted on flecainide.  She remained in normal  sinus rhythm and was discharged to home to be readmitted the following  Monday for an atrial flutter ablation. She was discharged to  home in stable  condition.   DISCHARGE MEDICATIONS:  1. Coumadin 5 mg every other day, 2.5 mg on other days.  2. Flecainide 100 mg twice a day.  3. Lipitor 10 mg nightly.  4. Enteric-coated aspirin 81 mg daily.  5.     Chinita Pester, C.R.N.P. [facility]                 Olga Millers, MD [facility]    DS/MEDQ  D:  04/04/2002  T:  04/05/2002  Job:  [id number]   cc:   Duke Salvia, M.D. [facility]   Willa Rough, MD [facility] in [place]

## 2010-10-28 NOTE — Op Note (Signed)
NAME:  [name], [name]                     ACCOUNT NO.:  [id number]   MEDICAL RECORD NO.:  [id number]                   PATIENT TYPE:  OIB   LOCATION:  6526                                 FACILITY:  [facility]   PHYSICIAN:  Duke Salvia, M.D. [facility]           DATE OF BIRTH:  02-05-1929   DATE OF PROCEDURE:  04/07/2002  DATE OF DISCHARGE:                                 OPERATIVE REPORT   PREOPERATIVE DIAGNOSIS:  Atrial flutter with 1 to 1 conduction, history of  atrial fibrillation.   POSTOPERATIVE DIAGNOSIS:  Multiple atrial flutters with atrial fibrillation  and left to right atrial discordance.   PROCEDURE:  Invasive electrophysiological study with arrhythmia mapping.   DESCRIPTION OF PROCEDURE:  Following the obtainment of informed consent, the  patient was brought to the electrophysiological laboratory and placed on the  fluoroscopic table in the supine position. After routine prep and drape,  cardiac catheterization was performed with local anesthesia and conscious  sedation.  Noninvasive blood pressure monitoring and transcutaneous oxygen  saturation monitoring and end tidal CO2 monitoring were performed  continuously throughout the procedure.  Following the procedure, the  catheter was removed and hemostasis was then obtained.  The patient was  transferred back to the floor in stable condition.   CATHETERS:  A 5 French quadripolar catheter was inserted via the left  femoral vein to the AV junction.  A 7 French dual decapolar catheter was  inserted via the left femoral vein to the tricuspid annulus and the coronary  sinus.  A 7 French 4 mm deflectable tip catheter was inserted via the right  femoral vein to mapping sites in the posterior septal space.  Surface leads  1, aVF, and V1 were monitored continuously throughout the procedure.  Following insertion of the catheters, the stimulation protocol included  incremental atrial pacing and coronary sinus pacing.  Surface  leads 1, aVF,  and V1 were monitored continuously throughout the procedure.   RESULTS:  Surface electrocardiogram;  the patient arrived in the room in  sinus rhythm.  The cycle length was 1259 milliseconds, the PR interval was  167 milliseconds, QT interval was 182 milliseconds, QT interval was 191  milliseconds, Q wave duration was 114 milliseconds.  Bundle branch block was  absent.  Preexcitation was absent.   His-Purkinje system function; HV interval was 40 milliseconds.   Arrhythmias; the patient arrived in sinus rhythm.  During incremental atrial  pacing, the patient had induction of nonsustained atrial tachycardia with  burst cycle length of 400 milliseconds.  Interestingly, the left atrium at  this point was in flutter and the right atrium was in atrial fibrillation.   Coronary sinus pacing then induced both fibrillation atrial flutter that  appeared clockwise and atrial flutter that appeared to be counter clockwise  which would meld one into the other suggesting that they were in fact not  both isthmus dependent.  There was also relatively a non circular  rhythm in  the right atrium and all three of these flutters conducted 1 to 1 at various  times with an atrial cycle length of approximately 330 milliseconds to 350  milliseconds.   IMPRESSION:  1. Sinus bradycardia.  2. Abnormal atrial function with multiple inducible atrial arrhythmias.  3. Dual atrioventricular nodal physiology without discontinuity.  4. Normal His-Purkinje system function.   CONCLUSION:  The results of electrophysiological testing identified multiple  atrial arrhythmias each of which could conduct 1 to 1 in the patient.  Because of this, ablation as a primary therapy seems to be of low yield.  At  this point, I recommend the discontinuation of the Flecainide, initiation of  amiodarone.  Given her bradycardia at the beginning of the procedure, I  suspect that she will need backup brady pacing, but this  will be done  expectantly.                                               Duke Salvia, M.D. [facility]    SCK/MEDQ  D:  04/07/2002  T:  04/07/2002  Job:  [id number]

## 2010-10-28 NOTE — Assessment & Plan Note (Signed)
[facility]                           ELECTROPHYSIOLOGY OFFICE NOTE   NAME:[name], [name] BEITZ                  MRN:          [id number]  DATE:05/07/2006                            DOB:          29-Jan-1929    [name] is an elderly woman with coronary artery disease, hypertension,  bradycardia, atrial fibrillation on amiodarone.  Overall, [name] has  done well except she does note that her anginal symptoms have increased in  frequency and severity as of late.  There have been no clear unstable  anginal symptoms but she does get chest pressure with exertion.  The patient  denies palpitations and has not felt her heart racing at all.  She denies  peripheral edema.  She gets dyspnea with exertion.   EXAM:  She is a pleasant 77 year old woman in no acute distress.  The blood  pressure today was 120/74, the pulse was 60 and regular, the respirations  were 18, the weight was 207 pounds.  NECK:  Revealed no jugular venous distention.  LUNGS:  Clear bilaterally to auscultation.  There are no wheezes, rales or  rhonchi.  CARDIOVASCULAR:  Revealed a regular rate and rhythm with normal S1, S2.   EKG today demonstrates atrial pacing with ventricular sensing.   MEDICATIONS:  1. Aspirin 81 a day.  2. Coumadin.  3. Pacerone.  4. Pravachol.   Interrogation of her pacemaker demonstrates a Manufacturing systems engineer with P-  waves of 1 millivolt and R waves of greater than 12.  The impedence was 435  in the atrium, 458 in the ventricle with a threshold volt at 0.5 in the  atrium, 0.625 at 0.5 in the ventricle.  The battery voltage was 2.75 volts.   IMPRESSION:  1. Symptomatic bradycardia.  2. Status post pacemaker.  3. History of atrial flutter with 1:1 conduction status post flutter      ablation.  4. Atrial fibrillation.  5. Chronic amiodarone therapy.  6. Chronic Coumadin therapy.  7. Dyslipidemia.   DISCUSSION:  [name] is stable; however, I  am concerned that her chest  pressure that she experiences has increased in frequency and severity and  consequently resulted in worsening anginal symptoms.  I will plan on  obtaining an exercise Myoview stress testing to  better delineate the severity of this in this patient with prior coronary  disease and stenting.  Will  plan to see her back in 6 months for general cardiology follow up and she  will continue to follow up with monthly phone checks for her pacemaker.     Doylene Canning. Ladona Ridgel, MD  Electronically Signed    GWT/MedQ  DD: 05/07/2006  DT: 05/07/2006  Job #: [id number]

## 2010-10-28 NOTE — Discharge Summary (Signed)
[place]. [facility]  Patient:    [name], [name]                    MRN: [id number] Adm. Date:  09/14/00 Disc. Date: 09/15/00 Attending:  Doylene Canning. Ladona Ridgel, M.D. [facility] Dictator:   Gene Serpe, P.A.                  Referring Physician Discharge [place]  PROCEDURES:  Electrophysiology study with direct current cardioversion on September 14, 2000.  REASON FOR ADMISSION:  Please refer to the dictated admission note.  HOSPITAL COURSE:  The patient was admitted by Doylene Canning. Ladona Ridgel, M.D., for elective electrophysiology study and possible ablation.  The patient underwent procedure on September 14, 2000, by Doylene Canning. Ladona Ridgel, M.D. (see operative note for full details), revealing dual AV nodal physiology, but no inducible supraventricular tachycardia.  He was, however, able to induce atrial fibrillation with RVR, which was treated with intravenous ibutilide and DCCV (x 2) followed by 5 mg of intravenous Lopressor.  The patient subsequently spontaneously converted to NSR wherein she remained by the time of discharge the following day.  Doylene Canning. Ladona Ridgel, M.D., offered the option of treatment dofetilide for management of atrial fibrillation.  However, the patient declined.  Doylene Canning. Ladona Ridgel, M.D., also recommended deferring Coumadin anticoagulation at the present time.  He recommended discharging on low-dose verapamil and discontinuing Lanoxin.  MEDICATIONS AT DISCHARGE: 1. Verapamil 120 mg q.d. 2. Aspirin 81 mg q.d.  DISCHARGE INSTRUCTIONS:  The patient is to refrain from taking Lanoxin.  She is to call the office if there is any swelling/bleeding from the wound sites.  The patient is to call and schedule a follow-up appointment with Doylene Canning. Ladona Ridgel, M.D., in approximately six weeks.  DISCHARGE DIAGNOSES: 1. Recurrent supraventricular tachycardia.    a. Electrophysiology on September 14, 2000:  Dual atrioventricular nodal       physiology without inducible supraventricular  tachycardia; inducible       atrial fibrillation with rapid ventricular response.    b. Status post direct current cardioversion (x 3)/ibutilide.    c. Spontaneous conversion to sinus bradycardia. 2. History of thrombophlebitis.    a. Treated with Coumadin. DD:  09/16/00 TD:  09/16/00 Job: [id number]

## 2010-10-28 NOTE — Discharge Summary (Signed)
NAME:  [name], [name]                     ACCOUNT NO.:  [id number]   MEDICAL RECORD NO.:  [id number]                   PATIENT TYPE:  OIB   LOCATION:  6526                                 FACILITY:  [facility]   PHYSICIAN:  Duke Salvia, M.D. [facility]           DATE OF BIRTH:  [birth year]   DATE OF ADMISSION:  04/07/2002  DATE OF DISCHARGE:  04/09/2002                                 DISCHARGE SUMMARY   PRIMARY DIAGNOSIS:  Atrial flutter.   SECONDARY DIAGNOSIS:  Symptomatic tachy/brady syndrome.   HISTORY OF PRESENT ILLNESS:  This is a pleasant 77 year old female who was  recently hospitalized with atrial flutter and a 1:1 AV conduction.  She has  also had episodes of bradycardia with heart rates in the 40s and long  pauses.  She was referred back for an EP study, which demonstrated multiple  atrial flutters in conjunction with atrial fibrillation.  It was felt that  because of her multiple non-ACE  dependent atrial flutters that she was not  a suitable candidate for catheter ablation and her atrial flutter secondary  to high likelihood of recurrent atrial arrhythmias.   HOSPITAL COURSE:  The patient underwent placement of a dual-chamber St. Jude  pacemaker on April 07, 2002, she tolerated the procedure well and had no  immediate postoperative complications.  She was placed on amiodarone and  discharged.  The patient did have complaints of itching from her neck to her  waist and onto her back that was thought to be secondary to Betadine.  She  received a dose of 20 mg of prednisone that evening.  The patient was placed  on hydrocortisone b.i.d. and to call the office if the itching worsening.   DISCHARGE MEDICATIONS:  She was discharged on the following medications:  1. Lipitor 10 mg nightly.  2. Coated aspirin 81 mg daily.  3. Coumadin 5 mg alternating with 2.5 mg daily.  4. Amiodarone 400 mg every 12 hours for seven days and then 400 mg daily.  5. Tylenol one to two tablets as  needed for pain.   ACTIVITY:  As per the supplemental discharge sheet.   DIET:  Low-fat, low-cholesterol, low-salt diet.   FOLLOW-UP:  The patient was to be seen at the [facility] in [place], [place], for a Coumadin check on April 14, 2002, at 8:30 a.m.  She was  scheduled for PFTs with a DLCO at [facility] on Friday, April 11, 2002, at 11 a.m. because of the initiation of amiodarone.  She was to be  seen in the pacemaker clinic at [place], [place], on April 21, 2002, at 9:45 a.m.  Follow up with Doylene Canning. Ladona Ridgel, M.D., in the office in  eight weeks and the office will call to schedule that appointment.   LABORATORY DATA:  The patient's TSH was 1.030.  Her LFTs were within normal  limits.     Chinita Pester, C.R.N.P. [facility]  Duke Salvia, M.D. [facility]    DS/MEDQ  D:  04/09/2002  T:  04/10/2002  Job:  [id number]   cc:   Doylene Canning. Ladona Ridgel, M.D. [facility]   Kathrine Cords, R.N. [facility]  [address]  [place], [name] [address]  Fax: 1   Pacemaker Clinic at [facility]  [place], [name]   Colon Flattery  [address]  [place]  [name] [address]  Fax: [phone]

## 2010-10-28 NOTE — Consult Note (Signed)
NAME:  [name], [name]                     ACCOUNT NO.:  [id number]   MEDICAL RECORD NO.:  [id number]                   PATIENT TYPE:  INP   LOCATION:  3730                                 FACILITY:  [facility]   PHYSICIAN:  Doylene Canning. Ladona Ridgel, M.D. [facility]           DATE OF BIRTH:  12/21/1928   DATE OF CONSULTATION:  04/02/2002  DATE OF DISCHARGE:                                   CONSULTATION   REASON FOR CONSULTATION:  Evaluation of rapid narrow QRS tachycardia.   HISTORY OF PRESENT ILLNESS:  The patient is a very pleasant 77 year old  woman with a history of tachypalpitations for some time.  She also has a  history of hyperlipidemia and a questionable history of supraventricular  tachycardia.  She had normal coronary arteries by catheterization four years  ago.  She was recently admitted to the hospital for evaluation of recurrent  tachypalpitations.  Her initial EKG has demonstrated both atrial flutter  with variable AV conduction as well as atrial fibrillation with the  predominance of her arythmias being that of atrial flutter.  She today had a  very rapid supraventricular tachycardia which in retrospect is 1:1 AV field  tachycardia as the tachycardia cycle length today matches the atrial flutter  cycle length from previous EKG strips.  This tachycardia was treated with IV  Cardizem with what appears to be return to 2:1 AV conduction.  She is  subsequently transferred here for additional evaluation and treatment.   PAST MEDICAL HISTORY:  As previously noted.   ALLERGIES:  No known drug allergies.   MEDICATIONS:  1. Toprol.  2. Flecainide 50 mg b.i.d.  3. Lipitor 10 mg q.d.  4. Aspirin.   SOCIAL HISTORY:  The patient is widowed and lives alone.  She denies tobacco  or ethanol abuse.  She has five sons.   FAMILY HISTORY:  Not notable for coronary artery disease.   REVIEW OF SYMPTOMS:  Notable for palpitations as previously noted.  She  denies any problems with arthritis.   She denies any skin problems.  She does  have a history of thrombophlebitis in the past, but denies any peripheral  edema now.  She denies any vision or hearing problems.  She denies any cough  or hemoptysis.  Denies chest pain or shortness of breath.  She states that  with her rapid atrial flutter she had dizziness and near syncope.   PHYSICAL EXAMINATION:  GENERAL:  She is a pleasant well-appearing 72-year-  old woman in no distress.  VITAL SIGNS:  Blood pressure on transfer to this hospital was 112/68, pulse  was 72 and irregular, respirations were 20, temperature was 99.  HEENT:  Normocephalic, atraumatic.  Pupils are equal and round.  Oropharynx  was moist.  The sclerae were anicteric.  NECK:  No jugular venous distention.  There was no thyromegaly.  The  carotids were 2+ and symmetric.  The trachea was midline.  LUNGS:  Clear  to auscultation bilaterally.  There were no wheezes, rhonchi,  or rales.  CARDIOVASCULAR:  Irregular rhythm without murmur.  PMI was not laterally  displaced.  ABDOMEN:  Soft, nontender, nondistended, no organomegaly.  EXTREMITIES:  No cyanosis, clubbing, or edema.  Pulses were 2+ and  symmetric.  NEUROLOGIC:  Alert and oriented x3 with cranial nerves II-XII grossly  intact.  Strength was 5/5 and symmetric.   LABORATORY DATA:  EKG today demonstrates typical atrial flutter with  variable, but typically 2:1 AV conduction.  Other EKG's demonstrate the  same, but also demonstrate what appears to be atypical flutter with 2:1 AV  conduction.  There are also EKG's of atrial fibrillation with irregularly  irregular ventricular response.   IMPRESSION:  1. Atrial flutter with variable AV conduction, including 1:1 AV conduction.  2. Atypical atrial flutter (question reverse typical versus left atrial).  3. Atrial fibrillation.  4. History of electrophysiology study with inducible atrial fibrillation and     dual AV nodal physiology, but no inducible AV node  re-entry tachycardia.  5. Chronic flecainide therapy.  6. Poorly intolerant of Toprol.   DISCUSSION:  As the patient's arythmias have not been particularly  responsive to low dose flecainide in conjunction with beta blockers, and  because the patient is not tolerating her beta blockers well, I would  recommend proceeding with catheter ablation of atrial flutter with TE  guidance.  I would also recommend discontinuation of flecainide and Toprol,  and initiation of Amiodarone.  This could be done in a low dose.  If of  course the patient has evidence of a left atrial appendage thrombus, then we  would discontinue Amiodarone and just use rate control in the interim while  she was being therapeutically coumadinized for four weeks.  For now, we will  start heparin.                                                Doylene Canning. Ladona Ridgel, M.D. [facility]    GWT/MEDQ  D:  04/02/2002  T:  04/03/2002  Job:  [id number]   cc:   Jonelle Sidle, M.D. [facility]   Kathrine Cords, R.N. [facility]  [address]  [place], [name] [address]  Fax: 1

## 2010-10-28 NOTE — Op Note (Signed)
NAME:  [name], [name]                     ACCOUNT NO.:  [id number]   MEDICAL RECORD NO.:  [id number]                   PATIENT TYPE:  OIB   LOCATION:  6526                                 FACILITY:  [facility]   PHYSICIAN:  Doylene Canning. Ladona Ridgel, M.D. [facility]           DATE OF BIRTH:  Oct 22, 1928   DATE OF PROCEDURE:  04/08/2002  DATE OF DISCHARGE:                                 OPERATIVE REPORT   PROCEDURE:  Insertion of a dual-chamber pacemaker.   INDICATIONS:  Symptomatic tachybrady syndrome.   INTRODUCTION:  The patient is a very pleasant 77 year old woman who was  recently hospitalized with atrial flutter and 1:10 AV conduction.  She also  had episodes of bradycardia with heart rates down into the 40s.  She also  had very long pauses.  She is now referred back as EP study demonstrated  multiple atrial flutters in conjunction with atrial fibrillation, and it was  the feeling that because of her multiple nonisthmus-dependent atrial  flutters that she was not a suitable candidate for catheter ablation of her  atrial flutter secondary to the high likelihood of recurrent atrial  arrhythmias.   DESCRIPTION OF PROCEDURE:  After informed consent was obtained, the patient  was taken to the diagnostic EP lab in a fasted state.  After the usual  preparation and draping, intravenous fentanyl and midazolam were given for  sedation.  A  total of 30 cc of lidocaine was infiltrated into the right  infraclavicular region.  A 6 cm incision was carried out over this region  and electrocautery utilized to dissect down to the subpectoralis fascia.  Ten cubic centimeters of contrast demonstrated a patent right subclavian  vein.  It was subsequently punctured x2 and the St. Jude model 1488 52-cm  active-fixation lead was placed in the right ventricle and a St. Jude model  1488 46-cm active-fixation lead placed in the right atrium.  The ventricular  lead serial number was [id number] and the atrial lead serial  number was  [id number].  Mapping was carried out in the right ventricle, and at the final  site the R-waves measured approximately 13 millivolts and the pacing  threshold was 0.6 volts at 0.5 msec with a pacing impedance of 640 Ohms once  the lead was actively fixed.  Ten-volt pacing did not result in  diaphragmatic stimulation.  With the ventricular lead in satisfactory  position, attention was turned to the atrial lead.  During insertion of the  ventricular lead, the pacing went back into atrial fibrillation.  Mapping in  the atrium was carried out in atrial fibrillation.  At the final site on the  lateral wall of the right atrium, the fibrillation waves measured between 1  and 2 millivolts, and the atrial pacing impedance in the AOO mode was 440  Ohms after the lead was actively fixed.  Again 10-volt pacing did not result  in diaphragmatic stimulation.  With both atrial and ventricular leads  in  satisfactory position, they were secured to the subpectoralis fascia with a  figure-of-eight silk suture.  In addition, the sewing sleeves were secured  with silk suture.  Electrocautery was utilized to make a subcutaneous  pocket.  Kanamycin irrigation was then utilized to irrigate the pocket.  Electrocautery was utilized to assure hemostasis.  The St. Jude Affinity  AFXBR model [id number] dual-chamber pacemaker was connected to the atrial and  ventricular pacing leads and placed in the subcutaneous pocket.  The  generator was secured with a silk suture.  The incision was then closed with  a layer of 2-0 Vicryl, followed by a layer of 3-0 Vicryl, followed by a  layer of 4-0 Vicryl.  Benzoin was painted on the skin and Steri-Strips were  applied and a pressure dressing placed, and the patient returned to her room  in satisfactory condition.   COMPLICATIONS:  There were no immediate procedural complications.    RESULTS:  This demonstrates successful implantation of a St. Jude dual-  chamber pacing  system in a patient with symptomatic tachybrady syndrome,  atrial flutter with 1:10 AV conduction, and long pauses secondary to post-  termination of her atrial arrhythmias, which were symptomatic.                                                Doylene Canning. Ladona Ridgel, M.D. [facility]    GWT/MEDQ  D:  04/08/2002  T:  04/08/2002  Job:  [id number]   cc:   [facility] [place], [name]   Kathrine Cords, [facility]

## 2010-12-16 ENCOUNTER — Telehealth: Payer: Self-pay | Admitting: Internal Medicine

## 2010-12-16 NOTE — Telephone Encounter (Signed)
Patient states she had heme positive stool cards at her GYN visit and they would like her to discuss with Dr Leone Payor if she needs a colon.  I have scheduled her an office visit for 01/18/11 9:45

## 2011-01-20 ENCOUNTER — Encounter: Payer: Self-pay | Admitting: Internal Medicine

## 2011-01-20 ENCOUNTER — Other Ambulatory Visit (INDEPENDENT_AMBULATORY_CARE_PROVIDER_SITE_OTHER): Payer: Medicare Other

## 2011-01-20 ENCOUNTER — Ambulatory Visit (INDEPENDENT_AMBULATORY_CARE_PROVIDER_SITE_OTHER): Payer: Medicare Other | Admitting: Internal Medicine

## 2011-01-20 ENCOUNTER — Telehealth: Payer: Self-pay

## 2011-01-20 VITALS — BP 138/84 | HR 74 | Ht 63.0 in | Wt 193.0 lb

## 2011-01-20 DIAGNOSIS — I4891 Unspecified atrial fibrillation: Secondary | ICD-10-CM

## 2011-01-20 DIAGNOSIS — K625 Hemorrhage of anus and rectum: Secondary | ICD-10-CM

## 2011-01-20 DIAGNOSIS — R195 Other fecal abnormalities: Secondary | ICD-10-CM

## 2011-01-20 DIAGNOSIS — Z86010 Personal history of colonic polyps: Secondary | ICD-10-CM

## 2011-01-20 DIAGNOSIS — K648 Other hemorrhoids: Secondary | ICD-10-CM | POA: Insufficient documentation

## 2011-01-20 DIAGNOSIS — Z7901 Long term (current) use of anticoagulants: Secondary | ICD-10-CM | POA: Insufficient documentation

## 2011-01-20 LAB — CBC WITH DIFFERENTIAL/PLATELET
Basophils Absolute: 0 10*3/uL (ref 0.0–0.1)
Basophils Relative: 0.6 % (ref 0.0–3.0)
HCT: 36.2 % (ref 36.0–46.0)
Hemoglobin: 12.2 g/dL (ref 12.0–15.0)
Lymphs Abs: 1.9 10*3/uL (ref 0.7–4.0)
MCHC: 33.6 g/dL (ref 30.0–36.0)
Monocytes Relative: 7.9 % (ref 3.0–12.0)
Neutro Abs: 2.3 10*3/uL (ref 1.4–7.7)
RBC: 3.61 Mil/uL — ABNORMAL LOW (ref 3.87–5.11)
RDW: 13.7 % (ref 11.5–14.6)

## 2011-01-20 MED ORDER — PEG-KCL-NACL-NASULF-NA ASC-C 100 G PO SOLR: 1.0000 | Freq: Once | ORAL | Status: DC

## 2011-01-20 NOTE — Assessment & Plan Note (Signed)
I suspect this is most likely hemorrhoidal. As per the heme positive stool assessment Will evaluate with colonoscopy to try to determine the exact cause.

## 2011-01-20 NOTE — Telephone Encounter (Signed)
Pt aware and rx sent to pharmacy for moviprep.

## 2011-01-20 NOTE — Progress Notes (Signed)
  Subjective:    Patient ID: [name], female    DOB: May 23, 1929, 77 y.o.   MRN: [id number]  HPI she saw her gynecologist and explained that she was having some rectal bleeding at times. She had some dark stools, initially described as black but when questioned more closely she says they were just dark brown at times. Bowel habits have not changed, they are regular. She describes bright red blood on the tissue paper and into the toilet along with a normal stool otherwise, at times but it is difficult to pin her down with respect to when and how frequently though it has not been that often. This has concerned her. Her gynecologist gave her Hemoccult cards were packed at a rate that was heme positive. She is seen with her son today. She is not having any cardiac or pulmonary problems that I can tell i.e. she is not symptomatic from her A. fib flutter Review of Systems As above    Objective:   Physical Exam  Constitutional: She appears well-developed and well-nourished.       obese  Cardiovascular: Normal rate, regular rhythm and normal heart sounds.   Pulmonary/Chest: Effort normal and breath sounds normal. No respiratory distress. She has no wheezes.  Abdominal:       obese          Assessment & Plan:

## 2011-01-20 NOTE — Assessment & Plan Note (Addendum)
This is most likely from her hemorrhoids if rectal bleeding she had a 1 cm adenomatous polyp in 2009, she is about 6 months out from her routine planned screening and surveillance colonoscopy but given his heme positive stool and the bleeding issues we'll go ahead with a colonoscopy now. She will need to hold her warfarin and we will clarify that that is okay with her cardiologist. Increased risk of stroke peripheral was explained to the patient and her son, though that is quite rare. Other more typical risks of colonoscopy were reviewed with the patient and son. This may be her last colonoscopy overall with respect to routine testing.  Ok to hold warfarin per Dr. Diona Browner - hold 4 days before procedure

## 2011-01-20 NOTE — Telephone Encounter (Signed)
Message copied by Michele Mcalpine on Fri Jan 20, 2011  2:44 PM ------      Message from: Iva Boop      Created: Fri Jan 20, 2011  2:35 PM       Let her know Hgb is normal      See her at colonoscpy

## 2011-01-20 NOTE — Patient Instructions (Signed)
Please go to the basement upon leaving today to have your labs done. You have been scheduled for a Colonoscopy with separate instructions given. Your prep kit has been sent to your pharmacy for you to pick up. You will be contaced by our office prior to your procedure for directions on holding your Coumadin/Warfarin.  If you do not hear from our office 1 week prior to your scheduled procedure, please call [phone] to discuss.

## 2011-01-30 ENCOUNTER — Telehealth: Payer: Self-pay | Admitting: Gastroenterology

## 2011-01-30 NOTE — Telephone Encounter (Signed)
Patient was told per Dr. Leone Payor and Dr. Diona Browner ok to stop Warfarin 4-5 days prior to procedure.

## 2011-01-30 NOTE — Telephone Encounter (Signed)
Message copied by Bernita Buffy on Mon Jan 30, 2011  1:37 PM ------      Message from: Iva Boop      Created: Sun Jan 29, 2011  3:46 PM      Regarding: hold warfarin       Hold warfarin 4 days before colonoscopy

## 2011-02-08 ENCOUNTER — Encounter: Payer: Self-pay | Admitting: Internal Medicine

## 2011-02-08 DIAGNOSIS — I4892 Unspecified atrial flutter: Secondary | ICD-10-CM

## 2011-02-20 ENCOUNTER — Encounter: Payer: Self-pay | Admitting: Internal Medicine

## 2011-02-20 ENCOUNTER — Ambulatory Visit (AMBULATORY_SURGERY_CENTER): Payer: Medicare Other | Admitting: Internal Medicine

## 2011-02-20 DIAGNOSIS — K625 Hemorrhage of anus and rectum: Secondary | ICD-10-CM

## 2011-02-20 DIAGNOSIS — K5730 Diverticulosis of large intestine without perforation or abscess without bleeding: Secondary | ICD-10-CM

## 2011-02-20 DIAGNOSIS — K648 Other hemorrhoids: Secondary | ICD-10-CM

## 2011-02-20 DIAGNOSIS — D126 Benign neoplasm of colon, unspecified: Secondary | ICD-10-CM

## 2011-02-20 DIAGNOSIS — Z86010 Personal history of colonic polyps: Secondary | ICD-10-CM

## 2011-02-20 DIAGNOSIS — R195 Other fecal abnormalities: Secondary | ICD-10-CM

## 2011-02-20 MED ORDER — SODIUM CHLORIDE 0.9 % IV SOLN: 500.0000 mL | INTRAVENOUS | Status: DC

## 2011-02-20 NOTE — Progress Notes (Signed)
Per MD bring pt back to procedure room to remove trapped air with upper GI scope.  Pt brought back to procedure room at 1329. Procedure finished, pt tolerated. Pt states that her abdominal pain is a 3/10 now. Pt states "It is much better than before when I was in the recovery room." Encouraged pt to continue to pass air through rectum.

## 2011-02-20 NOTE — Progress Notes (Signed)
1310: Pt is passing a small amount of air but rates abdominal discomfort at an 8/10. She states that it is primarily underneath her rib cage and some around left breast. Levsin, rectal tube, ambulation and sitting on toilet all without relief. Dr. Leone Payor notified, he will be up to assess patient in a few minutes.  1317: Pt still sitting on toilet, states that she is passing some more air and feeling a bit more relief.  1325: Pt still complaining of abdominal discomfort, 6/8. Dr. Leone Payor here assessing pt, taking back to room to try and decompress abdomen.

## 2011-02-20 NOTE — Patient Instructions (Addendum)
I removed a small, benign-appearing polyp. I will have it analyzed and let you know what it was. You still have diverticulosis and hemorrhoids. The hemorrhoids are what bled. You may not need another routine, preventive colonoscopy. I will explain in a letter after the polyp pathology is reviewed. Iva Boop, MD, [facility]   Resume your Warfarin tonight. Have your PT/INR checked within one week.

## 2011-02-20 NOTE — Progress Notes (Signed)
  She was slow to pass flatus - did some but despite that, rectal tube and hyoscyamine was still with 6/10 pain. Soft abdomen, BS+ but quiet. Tender with slight guarding in the RLQ. No back pain, nausea or vomiting. Gastroscope then used to decompress dilated colon, inserted to transverse. Abdomen softer and non-tender after and pain 3-4/10. She said she feels much better overall.

## 2011-02-21 ENCOUNTER — Telehealth: Payer: Self-pay | Admitting: *Deleted

## 2011-02-21 NOTE — Telephone Encounter (Signed)

## 2011-02-24 ENCOUNTER — Encounter: Payer: Self-pay | Admitting: Internal Medicine

## 2011-03-06 LAB — BASIC METABOLIC PANEL
BUN: 19
CO2: 26
Calcium: 9.4
Chloride: 107
Creatinine, Ser: 0.79

## 2011-03-06 LAB — HEMOGLOBIN AND HEMATOCRIT, BLOOD: Hemoglobin: 13.2

## 2011-03-22 ENCOUNTER — Telehealth: Payer: Self-pay | Admitting: *Deleted

## 2011-03-22 ENCOUNTER — Encounter: Payer: Self-pay | Admitting: Cardiology

## 2011-03-22 ENCOUNTER — Ambulatory Visit (INDEPENDENT_AMBULATORY_CARE_PROVIDER_SITE_OTHER): Payer: Medicare Other | Admitting: Cardiology

## 2011-03-22 VITALS — BP 134/85 | HR 63 | Ht 64.0 in | Wt 195.1 lb

## 2011-03-22 DIAGNOSIS — I495 Sick sinus syndrome: Secondary | ICD-10-CM

## 2011-03-22 DIAGNOSIS — I10 Essential (primary) hypertension: Secondary | ICD-10-CM

## 2011-03-22 DIAGNOSIS — I4891 Unspecified atrial fibrillation: Secondary | ICD-10-CM

## 2011-03-22 NOTE — Progress Notes (Signed)
Clinical Summary [name] is a 77 y.o.female presenting for followup. She was seen in March.  She has had interval followup with Dr. Leone [name] with history of hematochezia and colonic polyps. She was temporally off Coumadin for colonoscopy. She reports no significant bleeding, back on Coumadin.  She does report intermittent palpitations and weakness, presumably related to intermittent PAF. She continues on antiarrhythmic therapy and Coumadin. She has a pacemaker interrogation with Dr. Johney [name] scheduled for December.   No Known Allergies  Medication list reviewed.  Past Medical History  Diagnosis Date  . Arthritis   . Glaucoma   . Diverticulosis   . GERD (gastroesophageal reflux disease)   . Mixed hyperlipidemia   . Essential hypertension, benign   . Colon polyps   . Atrial fibrillation     Paroxysmal  . Atrial flutter     s/p RFA 2003  . Pneumonia     7/03, with encephalopathy  . Sinus bradycardia   . Hepatitis   . Varicose veins   . DVT, lower extremity   . IBS (irritable bowel syndrome)     Past Surgical History  Procedure Date  . Cholecystectomy   . Insert / replace / remove pacemaker 2003    Dual chamber St. Jude,  . Breast lumpectomy     Right breast   . Colonoscopy w/ biopsies and polypectomy 03/2003, 05/2008, 02/20/2011    severe diverticulosis, tubulovillous adenoma polyp, internal and external hemorrhoids 2012: severe diverticulosis, 4 mm polyp, hemorrhoids    Family History  Problem Relation Age of Onset  . Colon cancer Brother   . Colon cancer Other     Grandfather  . Cancer Father   . Stroke Mother   . Heart attack      CHF grandmother    Social History [name] reports that she has never smoked. She has never used smokeless tobacco. [name] reports that she does not drink alcohol.  Review of Systems As outlined above, otherwise negative.  Physical Examination Filed Vitals:   03/22/11 1007  BP: 134/85  Pulse: 63    Overweight  woman in no acute distress.  HEENT: Conjunctiva and lids normal, oropharynx clear.  Neck: Supple, no carotid bruits, no elevated jugular venous pressure.  Lungs: Clear to auscultation, nonlabored.  Cardiac: Regular rate and rhythm, no significant systolic murmur or S3.Marland Kitchen  Extremities: No pitting.   ECG Atrial paced rhythm at 60 beats per minute.   Problem List and Plan

## 2011-03-22 NOTE — Assessment & Plan Note (Signed)
Status post pacemaker placement, for interrogation with Dr. Johney Frame in December.

## 2011-03-22 NOTE — Assessment & Plan Note (Signed)
Continue present antiarrhythmic regimen and Coumadin.

## 2011-03-22 NOTE — Patient Instructions (Signed)
Your physician wants you to follow-up in: 6 months. You will receive a reminder letter in the mail one-two months in advance. If you don't receive a letter, please call our office to schedule the follow-up appointment. Your physician recommends that you continue on your current medications as directed. Please refer to the Current Medication list given to you today. 

## 2011-03-22 NOTE — Assessment & Plan Note (Signed)
Blood pressure reasonable today, continue same regimen.

## 2011-04-12 NOTE — Telephone Encounter (Signed)
Error

## 2011-05-26 ENCOUNTER — Encounter: Payer: Self-pay | Admitting: Internal Medicine

## 2011-05-26 ENCOUNTER — Ambulatory Visit (INDEPENDENT_AMBULATORY_CARE_PROVIDER_SITE_OTHER): Payer: Medicare Other | Admitting: Internal Medicine

## 2011-05-26 DIAGNOSIS — I4891 Unspecified atrial fibrillation: Secondary | ICD-10-CM

## 2011-05-26 DIAGNOSIS — I495 Sick sinus syndrome: Secondary | ICD-10-CM

## 2011-05-26 LAB — PACEMAKER DEVICE OBSERVATION
AL IMPEDENCE PM: 415 Ohm
BAMS-0003: 60 {beats}/min
BATTERY VOLTAGE: 2.71 V
VENTRICULAR PACING PM: 6

## 2011-05-26 NOTE — Assessment & Plan Note (Signed)
Normal pacemaker function See Arita Miss Art report No changes today  TTMs every 3 months Return in 12 months

## 2011-05-26 NOTE — Assessment & Plan Note (Signed)
Controlled with amiodarone 100mg  daily Goal INR 2-3  Dr Lysbeth Galas to follow LFTs and TFTs twice per year on amiodarone

## 2011-05-26 NOTE — Progress Notes (Signed)
PCP:  Josue Hector, MD Primary Cardiologist:  Dr Diona Browner  The patient presents today for routine electrophysiology followup.  Since last being seen in our clinic, the patient reports doing very well.  She feels that her afib is well controlled.  Today, she denies symptoms of palpitations, chest pain, shortness of breath, orthopnea, PND, lower extremity edema, dizziness, presyncope, syncope, or neurologic sequela.  The patient feels that she is tolerating medications without difficulties and is otherwise without complaint today.   Past Medical History  Diagnosis Date  . Arthritis   . Glaucoma   . Diverticulosis   . GERD (gastroesophageal reflux disease)   . Mixed hyperlipidemia   . Essential hypertension, benign   . Colon polyps   . Atrial fibrillation     Paroxysmal  . Atrial flutter     s/p RFA 2003  . Pneumonia     7/03, with encephalopathy  . Sinus bradycardia     s/p PPM (SJM)  . Hepatitis   . Varicose veins   . DVT, lower extremity   . IBS (irritable bowel syndrome)    Past Surgical History  Procedure Date  . Cholecystectomy   . Pacemaker insertion 2003    Dual chamber St. Jude,  . Breast lumpectomy     Right breast   . Colonoscopy w/ biopsies and polypectomy 03/2003, 05/2008, 02/20/2011    severe diverticulosis, tubulovillous adenoma polyp, internal and external hemorrhoids 2012: severe diverticulosis, 4 mm polyp, hemorrhoids    Current Outpatient Prescriptions  Medication Sig Dispense Refill  . amiodarone (PACERONE) 200 MG tablet Take 100 mg by mouth daily.       Marland Kitchen amLODipine (NORVASC) 2.5 MG tablet Take 2.5 mg by mouth daily.        Marland Kitchen aspirin 81 MG tablet Take 81 mg by mouth daily.        . bimatoprost (LUMIGAN) 0.03 % ophthalmic drops 1 drop at bedtime.        . calcium-vitamin D (OSCAL WITH D) 500-200 MG-UNIT per tablet Take 2 tablets by mouth daily.        . cyanocobalamin (,VITAMIN B-12,) 1000 MCG/ML injection Inject 1,000 mcg into the muscle every 30  (thirty) days.        . Glucosamine-Chondroit-Vit C-Mn (GLUCOSAMINE CHONDR 1500 COMPLX PO) Take 2 by mouth daily        . metoprolol (LOPRESSOR) 50 MG tablet Take 50 mg by mouth as needed.        . Multiple Vitamin (MULTIVITAMIN) tablet Take 1 tablet by mouth daily.        . nadolol (CORGARD) 40 MG tablet Take 40 mg by mouth daily.        Marland Kitchen warfarin (COUMADIN) 5 MG tablet Take 5 mg by mouth daily. Managed by Dr. Lysbeth Galas        No Known Allergies  History   Social History  . Marital Status: Widowed    Spouse Name: N/A    Number of Children: N/A  . Years of Education: N/A   Occupational History  . Retired    Social History Main Topics  . Smoking status: Never Smoker   . Smokeless tobacco: Never Used  . Alcohol Use: No  . Drug Use: No  . Sexually Active: Not on file   Other Topics Concern  . Not on file   Social History Narrative  . No narrative on file    Family History  Problem Relation Age of Onset  . Colon cancer Brother   .  Colon cancer Other     Grandfather  . Cancer Father   . Stroke Mother   . Heart attack      CHF grandmother    Physical Exam: Filed Vitals:   05/26/11 1328  BP: 122/77  Pulse: 70  Height: 5\' 6"  (1.676 m)  Weight: 198 lb 12.8 oz (90.175 kg)    GEN- The patient is well appearing, alert and oriented x 3 today.   Head- normocephalic, atraumatic Eyes-  Sclera clear, conjunctiva pink Ears- hearing intact Oropharynx- clear Neck- supple, no JVP Lymph- no cervical lymphadenopathy Lungs- Clear to ausculation bilaterally, normal work of breathing Chest- pacemaker pocket is well healed Heart- Regular rate and rhythm, no murmurs, rubs or gallops, PMI not laterally displaced GI- soft, NT, ND, + BS Extremities- no clubbing, cyanosis, or edema  Pacemaker interrogation- reviewed in detail today,  See PACEART report  Assessment and Plan:

## 2011-06-13 HISTORY — PX: OTHER SURGICAL HISTORY: SHX169

## 2011-08-18 ENCOUNTER — Telehealth: Payer: Self-pay | Admitting: *Deleted

## 2011-08-18 NOTE — Telephone Encounter (Signed)
Pt states she had her PTVP checked in November. She states she does the home checks. She was due for this in February but hasn't heard anything from the company. She is wondering if she needs her device checked.

## 2011-08-18 NOTE — Telephone Encounter (Signed)
Message left on voicemail for return call.  Did not state what in reference to.

## 2011-08-23 NOTE — Telephone Encounter (Signed)
Pt scheduled for telephone check 08-24-11 between 9-10. Pt aware of this.

## 2011-08-24 ENCOUNTER — Encounter: Payer: Self-pay | Admitting: Internal Medicine

## 2011-08-24 DIAGNOSIS — I495 Sick sinus syndrome: Secondary | ICD-10-CM

## 2011-09-25 ENCOUNTER — Encounter: Payer: Self-pay | Admitting: Internal Medicine

## 2011-09-25 DIAGNOSIS — I495 Sick sinus syndrome: Secondary | ICD-10-CM

## 2011-10-30 ENCOUNTER — Encounter: Payer: Self-pay | Admitting: Internal Medicine

## 2011-10-30 DIAGNOSIS — I4892 Unspecified atrial flutter: Secondary | ICD-10-CM

## 2011-11-21 ENCOUNTER — Encounter: Payer: Self-pay | Admitting: Cardiology

## 2011-11-21 ENCOUNTER — Ambulatory Visit (INDEPENDENT_AMBULATORY_CARE_PROVIDER_SITE_OTHER): Payer: MEDICARE | Admitting: Cardiology

## 2011-11-21 VITALS — BP 121/82 | HR 60 | Ht 66.0 in | Wt 197.0 lb

## 2011-11-21 DIAGNOSIS — I495 Sick sinus syndrome: Secondary | ICD-10-CM

## 2011-11-21 DIAGNOSIS — I10 Essential (primary) hypertension: Secondary | ICD-10-CM

## 2011-11-21 DIAGNOSIS — I4891 Unspecified atrial fibrillation: Secondary | ICD-10-CM

## 2011-11-21 DIAGNOSIS — Z01818 Encounter for other preprocedural examination: Secondary | ICD-10-CM

## 2011-11-21 NOTE — Assessment & Plan Note (Addendum)
Patient should be able to proceed with elective right knee replacement at an acceptable perioperative cardiac risk. She is not reporting any angina or progressive shortness of breath, heart rhythm has been stable, atrial paced today, and on Coumadin for paroxysmal atrial fibrillation. Coumadin will need to be held approximately 5 days prior to surgical date, resumed when acceptable postoperatively with goal INR 2.0-3.0. Once surgical date has been defined, would inform [employer] representative in case pacemaker needs to be interrogated or modified. Our service can be consulted while patient is an inpatient if necessary. Otherwise continue routine followup outpatient visit.

## 2011-11-21 NOTE — Assessment & Plan Note (Signed)
Symptomatically well controlled. No changes made today.

## 2011-11-21 NOTE — Assessment & Plan Note (Signed)
Blood pressure well-controlled today. 

## 2011-11-21 NOTE — Assessment & Plan Note (Signed)
Status post St. Jude pacemaker placement, followed by Dr. Allred. 

## 2011-11-21 NOTE — Patient Instructions (Signed)

## 2011-11-21 NOTE — Progress Notes (Signed)
Clinical Summary Ms. Rabon is a 78 y.o.female presenting for followup. She was seen in October 2012. Interval followup noted with Dr. Johney Frame in December.  She presents for a routine visit, also preoperative evaluation. She is being considered for elective right knee replacement with Dr[facility] Darrelyn Hillock of [facility]. Surgery has not yet been scheduled.  Functionally, Ms. Shepard is limited by right knee pain, although has had no progressive shortness of breath, no angina or palpitations. She is able to climb a flight of steps without stopping.  She reports no bleeding problems on Coumadin, followed by Dr. Lysbeth Galas. Other cardiac medications have been stable.  Followup ECG today shows an atrial paced rhythm at 60 beats per minute, nonspecific ST-T changes.   No Known Allergies  Current Outpatient Prescriptions  Medication Sig Dispense Refill  . amiodarone (PACERONE) 200 MG tablet Take 100 mg by mouth daily.       Marland Kitchen amLODipine (NORVASC) 2.5 MG tablet Take 2.5 mg by mouth daily.        Marland Kitchen aspirin 81 MG tablet Take 81 mg by mouth daily.        . bimatoprost (LUMIGAN) 0.03 % ophthalmic drops 1 drop at bedtime.        . calcium-vitamin D (OSCAL WITH D) 500-200 MG-UNIT per tablet Take 2 tablets by mouth daily.        . cyanocobalamin (,VITAMIN B-12,) 1000 MCG/ML injection Inject 1,000 mcg into the muscle every 30 (thirty) days.        . Glucosamine-Chondroit-Vit C-Mn (GLUCOSAMINE CHONDR 1500 COMPLX PO) Take 2 by mouth daily        . Melaton-Thean-Cham-PassF-LBalm (MELATONIN + L-THEANINE PO) Take 1 tablet by mouth daily.      . Multiple Vitamin (MULTIVITAMIN) tablet Take 1 tablet by mouth daily.        . nadolol (CORGARD) 40 MG tablet Take 40 mg by mouth daily.        . Pitavastatin Calcium (LIVALO) 4 MG TABS Take 2 mg by mouth daily.      Marland Kitchen warfarin (COUMADIN) 5 MG tablet Take 5 mg by mouth daily. Managed by Dr. Lysbeth Galas      . metoprolol (LOPRESSOR) 50 MG tablet Take 50 mg by mouth as  needed.          Past Medical History  Diagnosis Date  . Arthritis   . Glaucoma   . Diverticulosis   . GERD (gastroesophageal reflux disease)   . Mixed hyperlipidemia   . Essential hypertension, benign   . Colon polyps   . Atrial fibrillation     Paroxysmal  . Atrial flutter     s/p RFA 2003  . Pneumonia     7/03, with encephalopathy  . Sinus bradycardia     s/p PPM (SJM)  . Hepatitis   . Varicose veins   . DVT, lower extremity   . IBS (irritable bowel syndrome)     Social History Ms. Salmela reports that she has never smoked. She has never used smokeless tobacco. Ms. Zemaitis reports that she does not drink alcohol.  Review of Systems No orthopnea or PND. Occasional leg edema. Vericose veins. Stable appetite. No reported bleeding problems. No syncope. Otherwise negative.  Physical Examination Filed Vitals:   11/21/11 1306  BP: 121/82  Pulse: 60    Overweight woman in no acute distress.  HEENT: Conjunctiva and lids normal, oropharynx clear.  Neck: Supple, no carotid bruits, no elevated jugular venous pressure.  Lungs: Clear to auscultation, nonlabored.  Cardiac: Regular rate and rhythm, no significant systolic murmur or S3..  Abdomen: Soft, bowel sounds present. Extremities: No pitting. Verrucous veins noted. Musculoskeletal: No kyphosis. Skin: Warm and dry. Neuropsychiatric: Alert and oriented x3, affect appropriate.   Problem List and Plan   No problem-specific assessment & plan notes found for this encounter.   Jonelle Sidle, M.D., F.A.C.C.

## 2011-11-27 DIAGNOSIS — I495 Sick sinus syndrome: Secondary | ICD-10-CM

## 2011-12-05 ENCOUNTER — Telehealth: Payer: Self-pay | Admitting: *Deleted

## 2011-12-05 NOTE — Telephone Encounter (Signed)
Received call from orthopedic surgeon asking whether or not the patient's device has been interrogated in the past 6 months. Please advise.

## 2011-12-05 NOTE — Telephone Encounter (Signed)
Last check in office December 2012, she will be due for return follow up December 2013.  The report is available in the chart under CV procedures and then Pacemaker device observation.

## 2011-12-05 NOTE — Telephone Encounter (Signed)
Called and informed Tammy at orthopedic office and gave number for [employer].

## 2011-12-07 ENCOUNTER — Encounter (HOSPITAL_COMMUNITY): Payer: Self-pay | Admitting: Pharmacy Technician

## 2011-12-12 ENCOUNTER — Ambulatory Visit (HOSPITAL_COMMUNITY)
Admission: RE | Admit: 2011-12-12 | Discharge: 2011-12-12 | Disposition: A | Discharge status: Home / Self Care | Payer: Medicare Other | Source: Ambulatory Visit | Attending: Orthopedic Surgery | Admitting: Orthopedic Surgery

## 2011-12-12 ENCOUNTER — Encounter (HOSPITAL_COMMUNITY)
Admission: RE | Admit: 2011-12-12 | Discharge: 2011-12-12 | Disposition: A | Discharge status: Home / Self Care | Payer: Medicare Other | Source: Ambulatory Visit | Attending: Orthopedic Surgery | Admitting: Orthopedic Surgery

## 2011-12-12 ENCOUNTER — Encounter (HOSPITAL_COMMUNITY): Payer: Self-pay

## 2011-12-12 DIAGNOSIS — Z01812 Encounter for preprocedural laboratory examination: Secondary | ICD-10-CM | POA: Insufficient documentation

## 2011-12-12 DIAGNOSIS — Z01818 Encounter for other preprocedural examination: Secondary | ICD-10-CM | POA: Insufficient documentation

## 2011-12-12 HISTORY — DX: Age-related osteoporosis without current pathological fracture: M81.0

## 2011-12-12 LAB — COMPREHENSIVE METABOLIC PANEL
ALT: 15 U/L (ref 0–35)
AST: 17 U/L (ref 0–37)
Albumin: 3.9 g/dL (ref 3.5–5.2)
Alkaline Phosphatase: 48 U/L (ref 39–117)
BUN: 15 mg/dL (ref 6–23)
CO2: 28 mEq/L (ref 19–32)
Calcium: 9.5 mg/dL (ref 8.4–10.5)
Chloride: 104 mEq/L (ref 96–112)
Creatinine, Ser: 0.8 mg/dL (ref 0.50–1.10)
GFR calc Af Amer: 79 mL/min — ABNORMAL LOW (ref >90)
GFR calc non Af Amer: 68 mL/min — ABNORMAL LOW (ref >90)
Glucose, Bld: 88 mg/dL (ref 70–99)
Potassium: 4 mEq/L (ref 3.5–5.1)
Sodium: 138 mEq/L (ref 135–145)
Total Bilirubin: 0.4 mg/dL (ref 0.3–1.2)
Total Protein: 7.3 g/dL (ref 6.0–8.3)

## 2011-12-12 LAB — CBC
HCT: 40 % (ref 36.0–46.0)
Hemoglobin: 13.5 g/dL (ref 12.0–15.0)
MCH: 32.8 pg (ref 26.0–34.0)
MCHC: 33.8 g/dL (ref 30.0–36.0)
MCV: 97.3 fL (ref 78.0–100.0)
Platelets: 191 10*3/uL (ref 150–400)
RBC: 4.11 MIL/uL (ref 3.87–5.11)
RDW: 13 % (ref 11.5–15.5)
WBC: 4.7 10*3/uL (ref 4.0–10.5)

## 2011-12-12 LAB — PROTIME-INR
INR: 1.83 — ABNORMAL HIGH (ref 0.00–1.49)
Prothrombin Time: 21.5 seconds — ABNORMAL HIGH (ref 11.6–15.2)

## 2011-12-12 LAB — DIFFERENTIAL
Basophils Absolute: 0 10*3/uL (ref 0.0–0.1)
Basophils Relative: 0 % (ref 0–1)
Eosinophils Absolute: 0.2 10*3/uL (ref 0.0–0.7)
Eosinophils Relative: 4 % (ref 0–5)
Lymphocytes Relative: 43 % (ref 12–46)
Lymphs Abs: 2 10*3/uL (ref 0.7–4.0)
Monocytes Absolute: 0.6 10*3/uL (ref 0.1–1.0)
Monocytes Relative: 13 % — ABNORMAL HIGH (ref 3–12)
Neutro Abs: 1.8 10*3/uL (ref 1.7–7.7)
Neutrophils Relative %: 39 % — ABNORMAL LOW (ref 43–77)

## 2011-12-12 LAB — URINE MICROSCOPIC-ADD ON

## 2011-12-12 LAB — URINALYSIS, ROUTINE W REFLEX MICROSCOPIC
Bilirubin Urine: NEGATIVE
Glucose, UA: NEGATIVE mg/dL
Hgb urine dipstick: NEGATIVE
Ketones, ur: NEGATIVE mg/dL
Nitrite: NEGATIVE
Protein, ur: NEGATIVE mg/dL
Specific Gravity, Urine: 1.018 (ref 1.005–1.030)
Urobilinogen, UA: 0.2 mg/dL (ref 0.0–1.0)
pH: 7 (ref 5.0–8.0)

## 2011-12-12 LAB — SURGICAL PCR SCREEN: Staphylococcus aureus: NEGATIVE

## 2011-12-12 LAB — APTT: aPTT: 36 seconds (ref 24–37)

## 2011-12-12 NOTE — Progress Notes (Signed)
12/12/11 1611  OBSTRUCTIVE SLEEP APNEA  Have you ever been diagnosed with sleep apnea through a sleep study? No  Do you snore loudly (loud enough to be heard through closed doors)?  1  Do you often feel tired, fatigued, or sleepy during the daytime? 1  Has anyone observed you stop breathing during your sleep? 1  Do you have, or are you being treated for high blood pressure? 1  BMI more than 35 kg/m2? 0  Age over 76 years old? 1  Neck circumference greater than 40 cm/18 inches? 0  Gender: 0  Obstructive Sleep Apnea Score 5   Score 4 or greater  Updated health history;Results sent to PCP    

## 2011-12-12 NOTE — Patient Instructions (Addendum)
[address] [place] [address]  12/12/2011   Your procedure is scheduled on:  12/20/11 AT 10:45 AM  Report to SHORT STAY DEPT  at 8:15 AM.  Call this number if you have problems the morning of surgery: [phone]   Remember:   Do not eat food or drink liquids AFTER MIDNIGHT    Take these medicines the morning of surgery with A SIP OF WATER: PACERONE / NORVASC   Do not wear jewelry, make-up or nail polish.  Do not wear lotions, powders, or perfumes.   Do not shave legs or underarms 48 hrs. before surgery (men may shave face)  Do not bring valuables to the hospital.  Contacts, dentures or bridgework may not be worn into surgery.  Leave suitcase in the car. After surgery it may be brought to your room.  For patients admitted to the hospital, checkout time is 11:00 AM the day of discharge.   Patients discharged the day of surgery will not be allowed to drive home.    Special Instructions:   Please read over the following fact sheets that you were given: MRSA  Information / Incentive Spirometer               SHOWER WITH BETASEPT THE NIGHT BEFORE SURGERY AND THE MORNING OF SURGERY

## 2011-12-20 ENCOUNTER — Inpatient Hospital Stay (HOSPITAL_COMMUNITY): Payer: Medicare Other

## 2011-12-20 ENCOUNTER — Ambulatory Visit (HOSPITAL_COMMUNITY): Payer: Medicare Other | Admitting: Anesthesiology

## 2011-12-20 ENCOUNTER — Encounter (HOSPITAL_COMMUNITY): Payer: Self-pay | Admitting: Anesthesiology

## 2011-12-20 ENCOUNTER — Encounter (HOSPITAL_COMMUNITY): Payer: Self-pay | Admitting: *Deleted

## 2011-12-20 ENCOUNTER — Encounter (HOSPITAL_COMMUNITY)
Admission: RE | Disposition: A | Discharge status: Skilled Nursing Facility | Payer: Self-pay | Source: Ambulatory Visit | Attending: Orthopedic Surgery

## 2011-12-20 ENCOUNTER — Inpatient Hospital Stay (HOSPITAL_COMMUNITY)
Admission: RE | Admit: 2011-12-20 | Discharge: 2011-12-25 | DRG: 470 | Disposition: A | Discharge status: Skilled Nursing Facility | Payer: Medicare Other | Source: Ambulatory Visit | Attending: Orthopedic Surgery | Admitting: Orthopedic Surgery

## 2011-12-20 DIAGNOSIS — Z86718 Personal history of other venous thrombosis and embolism: Secondary | ICD-10-CM

## 2011-12-20 DIAGNOSIS — E782 Mixed hyperlipidemia: Secondary | ICD-10-CM | POA: Diagnosis present

## 2011-12-20 DIAGNOSIS — M21169 Varus deformity, not elsewhere classified, unspecified knee: Secondary | ICD-10-CM | POA: Diagnosis present

## 2011-12-20 DIAGNOSIS — D62 Acute posthemorrhagic anemia: Secondary | ICD-10-CM | POA: Diagnosis not present

## 2011-12-20 DIAGNOSIS — I10 Essential (primary) hypertension: Secondary | ICD-10-CM | POA: Diagnosis present

## 2011-12-20 DIAGNOSIS — Z96659 Presence of unspecified artificial knee joint: Secondary | ICD-10-CM

## 2011-12-20 DIAGNOSIS — Z79899 Other long term (current) drug therapy: Secondary | ICD-10-CM

## 2011-12-20 DIAGNOSIS — M1712 Unilateral primary osteoarthritis, left knee: Secondary | ICD-10-CM | POA: Diagnosis present

## 2011-12-20 DIAGNOSIS — Z853 Personal history of malignant neoplasm of breast: Secondary | ICD-10-CM

## 2011-12-20 DIAGNOSIS — E7800 Pure hypercholesterolemia, unspecified: Secondary | ICD-10-CM | POA: Diagnosis present

## 2011-12-20 DIAGNOSIS — Z860100 Personal history of colon polyps, unspecified: Secondary | ICD-10-CM

## 2011-12-20 DIAGNOSIS — I4891 Unspecified atrial fibrillation: Secondary | ICD-10-CM | POA: Diagnosis present

## 2011-12-20 DIAGNOSIS — Z7901 Long term (current) use of anticoagulants: Secondary | ICD-10-CM

## 2011-12-20 DIAGNOSIS — I495 Sick sinus syndrome: Secondary | ICD-10-CM | POA: Diagnosis present

## 2011-12-20 DIAGNOSIS — G4730 Sleep apnea, unspecified: Secondary | ICD-10-CM | POA: Diagnosis present

## 2011-12-20 DIAGNOSIS — Z950 Presence of cardiac pacemaker: Secondary | ICD-10-CM

## 2011-12-20 DIAGNOSIS — M1710 Unilateral primary osteoarthritis, unspecified knee: Principal | ICD-10-CM | POA: Diagnosis present

## 2011-12-20 DIAGNOSIS — I480 Paroxysmal atrial fibrillation: Secondary | ICD-10-CM | POA: Diagnosis present

## 2011-12-20 HISTORY — PX: TOTAL KNEE ARTHROPLASTY: SHX125

## 2011-12-20 LAB — TYPE AND SCREEN
ABO/RH(D): A POS
Antibody Screen: NEGATIVE

## 2011-12-20 LAB — PROTIME-INR: INR: 1.09 (ref 0.00–1.49)

## 2011-12-20 SURGERY — ARTHROPLASTY, KNEE, TOTAL
Anesthesia: General | Site: Knee | Laterality: Left | Wound class: Clean

## 2011-12-20 MED ORDER — ONDANSETRON HCL 4 MG/2ML IJ SOLN: 4.0000 mg | Freq: Four times a day (QID) | INTRAMUSCULAR | Status: DC | PRN

## 2011-12-20 MED ORDER — CEFAZOLIN SODIUM 1-5 GM-% IV SOLN
1.0000 g | Freq: Four times a day (QID) | INTRAVENOUS | Status: AC
  Administered 2011-12-20 (×2): 1 g via INTRAVENOUS
  Filled 2011-12-20 (×2): qty 50

## 2011-12-20 MED ORDER — DEXAMETHASONE SODIUM PHOSPHATE 4 MG/ML IJ SOLN
INTRAMUSCULAR | Status: DC | PRN
  Administered 2011-12-20: 10 mg via INTRAVENOUS

## 2011-12-20 MED ORDER — ONDANSETRON HCL 4 MG PO TABS: 4.0000 mg | ORAL_TABLET | Freq: Four times a day (QID) | ORAL | Status: DC | PRN

## 2011-12-20 MED ORDER — HYDROMORPHONE HCL PF 1 MG/ML IJ SOLN
INTRAMUSCULAR | Status: AC
  Filled 2011-12-20: qty 1

## 2011-12-20 MED ORDER — POLYETHYLENE GLYCOL 3350 17 G PO PACK
17.0000 g | PACK | Freq: Every day | ORAL | Status: DC | PRN
  Administered 2011-12-23 – 2011-12-24 (×2): 17 g via ORAL
  Filled 2011-12-20 (×3): qty 1

## 2011-12-20 MED ORDER — EPHEDRINE SULFATE 50 MG/ML IJ SOLN
INTRAMUSCULAR | Status: DC | PRN
  Administered 2011-12-20 (×5): 5 mg via INTRAVENOUS

## 2011-12-20 MED ORDER — ALUM & MAG HYDROXIDE-SIMETH 200-200-20 MG/5ML PO SUSP: 30.0000 mL | ORAL | Status: DC | PRN

## 2011-12-20 MED ORDER — LACTATED RINGERS IV SOLN
INTRAVENOUS | Status: DC
  Administered 2011-12-20: 1000 mL via INTRAVENOUS
  Administered 2011-12-20 (×2): via INTRAVENOUS

## 2011-12-20 MED ORDER — MELATONIN 3 MG PO CAPS: 1.0000 | ORAL_CAPSULE | Freq: Every day | ORAL | Status: DC

## 2011-12-20 MED ORDER — LIDOCAINE HCL (CARDIAC) 20 MG/ML IV SOLN
INTRAVENOUS | Status: DC | PRN
  Administered 2011-12-20: 50 mg via INTRAVENOUS

## 2011-12-20 MED ORDER — ACETAMINOPHEN 10 MG/ML IV SOLN
INTRAVENOUS | Status: AC
  Filled 2011-12-20: qty 100

## 2011-12-20 MED ORDER — THROMBIN 5000 UNITS EX SOLR
CUTANEOUS | Status: AC
  Filled 2011-12-20: qty 10000

## 2011-12-20 MED ORDER — METHOCARBAMOL 100 MG/ML IJ SOLN
500.0000 mg | Freq: Four times a day (QID) | INTRAVENOUS | Status: DC | PRN
  Administered 2011-12-20: 500 mg via INTRAVENOUS
  Filled 2011-12-20: qty 5

## 2011-12-20 MED ORDER — HEPARIN SODIUM (PORCINE) 5000 UNIT/ML IJ SOLN
5000.0000 [IU] | Freq: Two times a day (BID) | INTRAMUSCULAR | Status: DC
  Administered 2011-12-20 – 2011-12-25 (×10): 5000 [IU] via SUBCUTANEOUS
  Filled 2011-12-20 (×11): qty 1

## 2011-12-20 MED ORDER — ROCURONIUM BROMIDE 100 MG/10ML IV SOLN
INTRAVENOUS | Status: DC | PRN
  Administered 2011-12-20: 30 mg via INTRAVENOUS

## 2011-12-20 MED ORDER — PROMETHAZINE HCL 25 MG/ML IJ SOLN: 6.2500 mg | INTRAMUSCULAR | Status: DC | PRN

## 2011-12-20 MED ORDER — MENTHOL 3 MG MT LOZG: 1.0000 | LOZENGE | OROMUCOSAL | Status: DC | PRN

## 2011-12-20 MED ORDER — ONDANSETRON HCL 4 MG/2ML IJ SOLN
INTRAMUSCULAR | Status: DC | PRN
  Administered 2011-12-20: 4 mg via INTRAVENOUS

## 2011-12-20 MED ORDER — BUPIVACAINE LIPOSOME 1.3 % IJ SUSP
INTRAMUSCULAR | Status: DC | PRN
  Administered 2011-12-20: 40 mL

## 2011-12-20 MED ORDER — FERROUS SULFATE 325 (65 FE) MG PO TABS
325.0000 mg | ORAL_TABLET | Freq: Three times a day (TID) | ORAL | Status: DC
  Administered 2011-12-20 – 2011-12-25 (×14): 325 mg via ORAL
  Filled 2011-12-20 (×18): qty 1

## 2011-12-20 MED ORDER — PHENOL 1.4 % MT LIQD: 1.0000 | OROMUCOSAL | Status: DC | PRN

## 2011-12-20 MED ORDER — BACITRACIN ZINC 500 UNIT/GM EX OINT
TOPICAL_OINTMENT | CUTANEOUS | Status: DC | PRN
  Administered 2011-12-20: 1 via TOPICAL

## 2011-12-20 MED ORDER — FLEET ENEMA 7-19 GM/118ML RE ENEM: 1.0000 | ENEMA | Freq: Once | RECTAL | Status: AC | PRN

## 2011-12-20 MED ORDER — ACETAMINOPHEN 10 MG/ML IV SOLN
INTRAVENOUS | Status: DC | PRN
  Administered 2011-12-20: 1000 mg via INTRAVENOUS

## 2011-12-20 MED ORDER — METHOCARBAMOL 500 MG PO TABS
500.0000 mg | ORAL_TABLET | Freq: Four times a day (QID) | ORAL | Status: DC | PRN
  Administered 2011-12-23 – 2011-12-24 (×3): 500 mg via ORAL
  Filled 2011-12-20 (×3): qty 1

## 2011-12-20 MED ORDER — PROPOFOL 10 MG/ML IV EMUL
INTRAVENOUS | Status: DC | PRN
  Administered 2011-12-20: 150 mg via INTRAVENOUS

## 2011-12-20 MED ORDER — NEOSTIGMINE METHYLSULFATE 1 MG/ML IJ SOLN
INTRAMUSCULAR | Status: DC | PRN
  Administered 2011-12-20: 2 mg via INTRAVENOUS

## 2011-12-20 MED ORDER — ATORVASTATIN CALCIUM 10 MG PO TABS
10.0000 mg | ORAL_TABLET | Freq: Every day | ORAL | Status: DC
  Administered 2011-12-20 – 2011-12-24 (×5): 10 mg via ORAL
  Filled 2011-12-20 (×7): qty 1

## 2011-12-20 MED ORDER — KETAMINE HCL 10 MG/ML IJ SOLN
INTRAMUSCULAR | Status: DC | PRN
  Administered 2011-12-20: 2.5 mg via INTRAVENOUS
  Administered 2011-12-20 (×3): 5 mg via INTRAVENOUS
  Administered 2011-12-20: 25 mg via INTRAVENOUS
  Administered 2011-12-20: 2.5 mg via INTRAVENOUS

## 2011-12-20 MED ORDER — CEFAZOLIN SODIUM-DEXTROSE 2-3 GM-% IV SOLR
INTRAVENOUS | Status: AC
  Filled 2011-12-20: qty 50

## 2011-12-20 MED ORDER — WARFARIN SODIUM 7.5 MG PO TABS
7.5000 mg | ORAL_TABLET | Freq: Once | ORAL | Status: AC
  Administered 2011-12-20: 7.5 mg via ORAL
  Filled 2011-12-20 (×2): qty 1

## 2011-12-20 MED ORDER — CEFAZOLIN SODIUM-DEXTROSE 2-3 GM-% IV SOLR
2.0000 g | Freq: Once | INTRAVENOUS | Status: AC
  Administered 2011-12-20: 2 g via INTRAVENOUS

## 2011-12-20 MED ORDER — BUPIVACAINE LIPOSOME 1.3 % IJ SUSP
20.0000 mL | Freq: Once | INTRAMUSCULAR | Status: DC
  Filled 2011-12-20: qty 20

## 2011-12-20 MED ORDER — OXYCODONE-ACETAMINOPHEN 5-325 MG PO TABS
2.0000 | ORAL_TABLET | ORAL | Status: DC | PRN
  Administered 2011-12-22 – 2011-12-25 (×5): 2 via ORAL
  Filled 2011-12-20 (×5): qty 2

## 2011-12-20 MED ORDER — BIMATOPROST 0.01 % OP SOLN
1.0000 [drp] | Freq: Every day | OPHTHALMIC | Status: DC
  Administered 2011-12-20 – 2011-12-24 (×5): 1 [drp] via OPHTHALMIC
  Filled 2011-12-20: qty 2.5

## 2011-12-20 MED ORDER — AMLODIPINE BESYLATE 2.5 MG PO TABS
2.5000 mg | ORAL_TABLET | Freq: Every day | ORAL | Status: DC
  Administered 2011-12-21 – 2011-12-25 (×5): 2.5 mg via ORAL
  Filled 2011-12-20 (×8): qty 1

## 2011-12-20 MED ORDER — AMIODARONE HCL 100 MG PO TABS
100.0000 mg | ORAL_TABLET | Freq: Every evening | ORAL | Status: DC
  Filled 2011-12-20: qty 1

## 2011-12-20 MED ORDER — BISACODYL 10 MG RE SUPP: 10.0000 mg | Freq: Every day | RECTAL | Status: DC | PRN

## 2011-12-20 MED ORDER — METOCLOPRAMIDE HCL 5 MG/ML IJ SOLN
INTRAMUSCULAR | Status: DC | PRN
  Administered 2011-12-20: 10 mg via INTRAVENOUS

## 2011-12-20 MED ORDER — HYDROMORPHONE HCL PF 1 MG/ML IJ SOLN
0.2500 mg | INTRAMUSCULAR | Status: DC | PRN
  Administered 2011-12-20: 0.25 mg via INTRAVENOUS

## 2011-12-20 MED ORDER — ACETAMINOPHEN 650 MG RE SUPP: 650.0000 mg | Freq: Four times a day (QID) | RECTAL | Status: DC | PRN

## 2011-12-20 MED ORDER — SUCCINYLCHOLINE CHLORIDE 20 MG/ML IJ SOLN
INTRAMUSCULAR | Status: DC | PRN
  Administered 2011-12-20: 100 mg via INTRAVENOUS

## 2011-12-20 MED ORDER — SODIUM CHLORIDE 0.9 % IR SOLN
Status: DC | PRN
  Administered 2011-12-20: 3000 mL

## 2011-12-20 MED ORDER — CYANOCOBALAMIN 1000 MCG/ML IJ SOLN
1000.0000 ug | INTRAMUSCULAR | Status: DC
  Administered 2011-12-20: 1000 ug via INTRAMUSCULAR
  Filled 2011-12-20: qty 1

## 2011-12-20 MED ORDER — BACITRACIN ZINC 500 UNIT/GM EX OINT
TOPICAL_OINTMENT | CUTANEOUS | Status: AC
  Filled 2011-12-20: qty 15

## 2011-12-20 MED ORDER — NADOLOL 40 MG PO TABS
40.0000 mg | ORAL_TABLET | Freq: Every day | ORAL | Status: DC
  Administered 2011-12-21 – 2011-12-25 (×5): 40 mg via ORAL
  Filled 2011-12-20 (×7): qty 1

## 2011-12-20 MED ORDER — FENTANYL CITRATE 0.05 MG/ML IJ SOLN
INTRAMUSCULAR | Status: DC | PRN
  Administered 2011-12-20 (×2): 12.5 ug via INTRAVENOUS
  Administered 2011-12-20: 50 ug via INTRAVENOUS
  Administered 2011-12-20: 25 ug via INTRAVENOUS
  Administered 2011-12-20: 12.5 ug via INTRAVENOUS
  Administered 2011-12-20: 25 ug via INTRAVENOUS
  Administered 2011-12-20: 50 ug via INTRAVENOUS
  Administered 2011-12-20: 12.5 ug via INTRAVENOUS

## 2011-12-20 MED ORDER — SODIUM CHLORIDE 0.9 % IR SOLN
Status: DC | PRN
  Administered 2011-12-20: 09:00:00

## 2011-12-20 MED ORDER — ACETAMINOPHEN 325 MG PO TABS: 650.0000 mg | ORAL_TABLET | Freq: Four times a day (QID) | ORAL | Status: DC | PRN

## 2011-12-20 MED ORDER — LACTATED RINGERS IV SOLN
INTRAVENOUS | Status: DC
  Administered 2011-12-21 (×2): via INTRAVENOUS
  Administered 2011-12-22: 100 mL/h via INTRAVENOUS
  Administered 2011-12-22 – 2011-12-23 (×2): via INTRAVENOUS

## 2011-12-20 MED ORDER — GLYCOPYRROLATE 0.2 MG/ML IJ SOLN
INTRAMUSCULAR | Status: DC | PRN
  Administered 2011-12-20: 0.3 mg via INTRAVENOUS

## 2011-12-20 MED ORDER — HYDROMORPHONE HCL PF 1 MG/ML IJ SOLN
INTRAMUSCULAR | Status: DC | PRN
  Administered 2011-12-20: 0.5 mg via INTRAVENOUS
  Administered 2011-12-20 (×2): .25 mg via INTRAVENOUS
  Administered 2011-12-20: 0.5 mg via INTRAVENOUS
  Administered 2011-12-20 (×2): .25 mg via INTRAVENOUS

## 2011-12-20 MED ORDER — WARFARIN - PHARMACIST DOSING INPATIENT: Freq: Every day | Status: DC

## 2011-12-20 MED ORDER — THROMBIN 5000 UNITS EX SOLR
CUTANEOUS | Status: DC | PRN
  Administered 2011-12-20: 5000 [IU] via TOPICAL

## 2011-12-20 MED ORDER — HYDROMORPHONE HCL PF 1 MG/ML IJ SOLN
1.0000 mg | INTRAMUSCULAR | Status: DC | PRN
  Administered 2011-12-20 – 2011-12-21 (×8): 1 mg via INTRAVENOUS
  Filled 2011-12-20 (×8): qty 1

## 2011-12-20 MED ORDER — AMIODARONE HCL 100 MG PO TABS
100.0000 mg | ORAL_TABLET | Freq: Every evening | ORAL | Status: DC
  Administered 2011-12-21 – 2011-12-24 (×4): 100 mg via ORAL
  Filled 2011-12-20 (×5): qty 1

## 2011-12-20 SURGICAL SUPPLY — 62 items
BAG ZIPLOCK 12X15 (MISCELLANEOUS) ×2 IMPLANT
BANDAGE ELASTIC 4 VELCRO ST LF (GAUZE/BANDAGES/DRESSINGS) ×2 IMPLANT
BANDAGE ELASTIC 6 VELCRO ST LF (GAUZE/BANDAGES/DRESSINGS) ×2 IMPLANT
BANDAGE ESMARK 6X9 LF (GAUZE/BANDAGES/DRESSINGS) ×1 IMPLANT
BANDAGE GAUZE ELAST BULKY 4 IN (GAUZE/BANDAGES/DRESSINGS) ×2 IMPLANT
BLADE SAG 18X100X1.27 (BLADE) ×2 IMPLANT
BLADE SAW SGTL 11.0X1.19X90.0M (BLADE) ×2 IMPLANT
BNDG ESMARK 6X9 LF (GAUZE/BANDAGES/DRESSINGS) ×2
BONE CEMENT GENTAMICIN (Cement) ×4 IMPLANT
CEMENT BONE GENTAMICIN 40 (Cement) ×2 IMPLANT
CLOTH BEACON ORANGE TIMEOUT ST (SAFETY) ×2 IMPLANT
CUFF TOURN SGL QUICK 34 (TOURNIQUET CUFF) ×1
CUFF TRNQT CYL 34X4X40X1 (TOURNIQUET CUFF) ×1 IMPLANT
DRAPE EXTREMITY T 121X128X90 (DRAPE) ×2 IMPLANT
DRAPE INCISE IOBAN 66X45 STRL (DRAPES) ×2 IMPLANT
DRAPE LG THREE QUARTER DISP (DRAPES) ×2 IMPLANT
DRAPE POUCH INSTRU U-SHP 10X18 (DRAPES) ×2 IMPLANT
DRAPE U-SHAPE 47X51 STRL (DRAPES) ×2 IMPLANT
DRSG ADAPTIC 3X8 NADH LF (GAUZE/BANDAGES/DRESSINGS) ×2 IMPLANT
DRSG PAD ABDOMINAL 8X10 ST (GAUZE/BANDAGES/DRESSINGS) ×8 IMPLANT
DURAPREP 26ML APPLICATOR (WOUND CARE) ×2 IMPLANT
ELECT REM PT RETURN 9FT ADLT (ELECTROSURGICAL) ×2
ELECTRODE REM PT RTRN 9FT ADLT (ELECTROSURGICAL) ×1 IMPLANT
EVACUATOR 1/8 PVC DRAIN (DRAIN) ×2 IMPLANT
FACESHIELD LNG OPTICON STERILE (SAFETY) ×12 IMPLANT
GLOVE BIOGEL PI IND STRL 8 (GLOVE) ×1 IMPLANT
GLOVE BIOGEL PI IND STRL 8.5 (GLOVE) IMPLANT
GLOVE BIOGEL PI INDICATOR 8 (GLOVE) ×1
GLOVE BIOGEL PI INDICATOR 8.5 (GLOVE)
GLOVE ECLIPSE 8.0 STRL XLNG CF (GLOVE) ×4 IMPLANT
GLOVE SURG SS PI 6.5 STRL IVOR (GLOVE) ×4 IMPLANT
GOWN PREVENTION PLUS LG XLONG (DISPOSABLE) ×4 IMPLANT
GOWN STRL REIN XL XLG (GOWN DISPOSABLE) ×2 IMPLANT
HANDPIECE INTERPULSE COAX TIP (DISPOSABLE) ×1
IMMOBILIZER KNEE 20 (SOFTGOODS) ×2
IMMOBILIZER KNEE 20 THIGH 36 (SOFTGOODS) ×1 IMPLANT
KIT BASIN OR (CUSTOM PROCEDURE TRAY) ×2 IMPLANT
MANIFOLD NEPTUNE II (INSTRUMENTS) ×2 IMPLANT
NEEDLE HYPO 22GX1.5 SAFETY (NEEDLE) ×2 IMPLANT
NS IRRIG 1000ML POUR BTL (IV SOLUTION) ×2 IMPLANT
PACK TOTAL JOINT (CUSTOM PROCEDURE TRAY) ×2 IMPLANT
POSITIONER SURGICAL ARM (MISCELLANEOUS) ×2 IMPLANT
SET HNDPC FAN SPRY TIP SCT (DISPOSABLE) ×1 IMPLANT
SET PAD KNEE POSITIONER (MISCELLANEOUS) ×2 IMPLANT
SPONGE GAUZE 4X4 12PLY (GAUZE/BANDAGES/DRESSINGS) ×2 IMPLANT
SPONGE LAP 18X18 X RAY DECT (DISPOSABLE) ×2 IMPLANT
SPONGE SURGIFOAM ABS GEL 100 (HEMOSTASIS) ×2 IMPLANT
STAPLER VISISTAT 35W (STAPLE) IMPLANT
SUCTION FRAZIER 12FR DISP (SUCTIONS) ×2 IMPLANT
SUT BONE WAX W31G (SUTURE) ×2 IMPLANT
SUT VIC AB 0 CT1 27 (SUTURE) ×2
SUT VIC AB 0 CT1 27XBRD ANTBC (SUTURE) ×2 IMPLANT
SUT VIC AB 1 CT1 27 (SUTURE) ×5
SUT VIC AB 1 CT1 27XBRD ANTBC (SUTURE) ×5 IMPLANT
SUT VIC AB 2-0 CT1 27 (SUTURE) ×3
SUT VIC AB 2-0 CT1 TAPERPNT 27 (SUTURE) ×3 IMPLANT
SYR 20CC LL (SYRINGE) ×2 IMPLANT
TOWEL OR 17X26 10 PK STRL BLUE (TOWEL DISPOSABLE) ×4 IMPLANT
TOWER CARTRIDGE SMART MIX (DISPOSABLE) ×2 IMPLANT
TRAY FOLEY CATH 14FRSI W/METER (CATHETERS) ×2 IMPLANT
WATER STERILE IRR 1500ML POUR (IV SOLUTION) ×2 IMPLANT
WRAP KNEE MAXI GEL POST OP (GAUZE/BANDAGES/DRESSINGS) ×4 IMPLANT

## 2011-12-20 NOTE — Interval H&P Note (Signed)
History and Physical Interval Note:  12/20/2011 10:50 AM  [name]  has presented today for surgery, with the diagnosis of osteoarthritis left knee  The various methods of treatment have been discussed with the patient and family. After consideration of risks, benefits and other options for treatment, the patient has consented to  Procedure(s) (LRB): TOTAL KNEE ARTHROPLASTY (Left) as a surgical intervention .  The patient's history has been reviewed, patient examined, no change in status, stable for surgery.  I have reviewed the patients' chart and labs.  Questions were answered to the patient's satisfaction.     Jarold Macomber A

## 2011-12-20 NOTE — H&P (View-Only) (Signed)
12/12/11 1611  OBSTRUCTIVE SLEEP APNEA  Have you ever been diagnosed with sleep apnea through a sleep study? No  Do you snore loudly (loud enough to be heard through closed doors)?  1  Do you often feel tired, fatigued, or sleepy during the daytime? 1  Has anyone observed you stop breathing during your sleep? 1  Do you have, or are you being treated for high blood pressure? 1  BMI more than 35 kg/m2? 0  Age over 76 years old? 1  Neck circumference greater than 40 cm/18 inches? 0  Gender: 0  Obstructive Sleep Apnea Score 5   Score 4 or greater  Updated health history;Results sent to PCP

## 2011-12-20 NOTE — Anesthesia Procedure Notes (Signed)
Procedure Name: Intubation Date/Time: 12/20/2011 10:58 AM Performed by: Doran Clay Pre-anesthesia Checklist: Patient identified, Timeout performed, Emergency Drugs available, Suction available and Patient being monitored Patient Re-evaluated:Patient Re-evaluated prior to inductionOxygen Delivery Method: Circle system utilized Preoxygenation: Pre-oxygenation with 100% oxygen Intubation Type: IV induction Laryngoscope Size: Mac and 3 Grade View: Grade I Tube type: Oral Tube size: 7.0 mm Number of attempts: 1 Airway Equipment and Method: Stylet Secured at: 21 cm Tube secured with: Tape Dental Injury: Teeth and Oropharynx as per pre-operative assessment

## 2011-12-20 NOTE — Progress Notes (Signed)
PHARMACIST - PHYSICIAN ORDER COMMUNICATION  CONCERNING: P&T Medication Policy on Herbal Medications  DESCRIPTION:  This patient's order for Melatonin 3 mg daily at bedtime  has been noted.  This product(s) is classified as an "herbal" or natural product. Due to a lack of definitive safety studies or FDA approval, nonstandard manufacturing practices, plus the potential risk of unknown drug-drug interactions while on inpatient medications, the Pharmacy and Therapeutics Committee does not permit the use of "herbal" or natural products of this type within [facility].   ACTION TAKEN: The pharmacy department is unable to verify this order at this time and your patient has been informed of this safety policy.  Please reevaluate patient's clinical condition at discharge and address if the herbal or natural product(s) should be resumed at that time.   Polo Riley [facility] 12/20/2011 3:46 PM

## 2011-12-20 NOTE — Preoperative (Signed)
Beta Blockers   Reason not to administer Beta Blockers:Not Applicable, pt took home BB this am 

## 2011-12-20 NOTE — Brief Op Note (Signed)
12/20/2011  1:06 PM  PATIENT:  [name]  78 y.o. female  PRE-OPERATIVE DIAGNOSIS:  osteoarthritis left knee  POST-OPERATIVE DIAGNOSIS:  osteoarthritis left knee  PROCEDURE:  Procedure(s) (LRB): TOTAL KNEE ARTHROPLASTY (Left)  SURGEON:  Surgeon(s) and Role:    * Jacki Cones, MD - Primary  PHYSICIAN ASSISTANT: Dimitri Ped PA    ANESTHESIA:   general  EBL:  Total I/O In: 1000 [I.V.:1000] Out: 250 [Urine:200; Blood:50]  BLOOD ADMINISTERED:none  DRAINS: (1) Hemovact drain(s) in the right with  Suction Open   LOCAL MEDICATIONS USED:  BUPIVICAINE 20cc mixed with 20cc Normal Saline SPECIMEN:  No Specimen  DISPOSITION OF SPECIMEN:  N/A  COUNTS:  YES  TOURNIQUET:  * Missing tourniquet times found for documented tourniquets in log:  46220 *  DICTATION: .Other Dictation: Dictation Number [id number]  PLAN OF CARE: Admit to inpatient   PATIENT DISPOSITION:  Stable in OR   Delay start of Pharmacological VTE agent (>24hrs) due to surgical blood loss or risk of bleeding: yes

## 2011-12-20 NOTE — Progress Notes (Signed)
ANTICOAGULATION CONSULT NOTE - Initial Consult  Pharmacy Consult for Warfarin Indication: Afib, VTE prophylaxis s/p left TKA  No Known Allergies  Patient Measurements: 88.9kg  Vital Signs: Temp: 97.2 F (36.2 C) (07/10 1523) Temp src: Oral (07/10 0807) BP: 105/61 mmHg (07/10 1523) Pulse Rate: 60  (07/10 1523)  Labs:  Basename 12/20/11 0830  HGB --  HCT --  PLT --  APTT --  LABPROT 14.3  INR 1.09  HEPARINUNFRC --  CREATININE --  CKTOTAL --  CKMB --  TROPONINI --    The CrCl is unknown because both a height and weight (above a minimum accepted value) are required for this calculation.   Medical History: Past Medical History  Diagnosis Date  . Arthritis   . Glaucoma   . Diverticulosis   . GERD (gastroesophageal reflux disease)   . Mixed hyperlipidemia   . Essential hypertension, benign   . Colon polyps   . Atrial fibrillation     Paroxysmal  . Atrial flutter     s/p RFA 2003  . Pneumonia     7/03, with encephalopathy  . Sinus bradycardia     s/p PPM (SJM)  . Varicose veins   . DVT, lower extremity   . IBS (irritable bowel syndrome)   . Osteoporosis   . Cancer     breast cancer 1980's - lumpectomy only  . Varicose veins   . Sleep apnea     STOP BANG SCORE 5    Medications:  Scheduled:    . amiodarone  100 mg Oral QPM  . amLODipine  2.5 mg Oral Q breakfast  . atorvastatin  10 mg Oral q1800  . bimatoprost  1 drop Both Eyes QHS  .  ceFAZolin (ANCEF) IV  1 g Intravenous Q6H  .  ceFAZolin (ANCEF) IV  2 g Intravenous Once  . cyanocobalamin  1,000 mcg Intramuscular Q30 days  . ferrous sulfate  325 mg Oral TID PC  . heparin  5,000 Units Subcutaneous Q12H  . HYDROmorphone      . Melatonin  1 capsule Oral QHS  . nadolol  40 mg Oral Q breakfast  . DISCONTD: bupivacaine liposome  20 mL Infiltration Once   Infusions:    . lactated ringers    . DISCONTD: lactated ringers 100 mL/hr at 12/20/11 1514   PRN: acetaminophen, acetaminophen, alum & mag  hydroxide-simeth, bisacodyl, HYDROmorphone (DILAUDID) injection, menthol-cetylpyridinium, methocarbamol (ROBAXIN) IV, methocarbamol, ondansetron (ZOFRAN) IV, ondansetron, oxyCODONE-acetaminophen, phenol, polyethylene glycol, sodium phosphate, DISCONTD: bacitracin, DISCONTD: bupivacaine liposome, DISCONTD:  HYDROmorphone (DILAUDID) injection, DISCONTD: polymyxin / bacitracin (DOUBLE ANTIBIOTIC) irrigation DISCONTD: promethazine, DISCONTD: sodium chloride irrigation, DISCONTD: thrombin  Assessment:  82 YOM on chronic warfarin/amiodarone for PAF  Home dose reported as 5mg  daily, last dose taken 7/4 in anticipation of surgery  Resume tonight s/p left TKA, INR = 1.09   Goal of Therapy:  INR 2-3 Monitor platelets by anticoagulation protocol: Yes   Plan:   Warfarin 7.5mg  tonight (1.5x maintenance dose)  Heparin 5000 units sq q12h until INR > 2 (per MD)  Daily PT/INR  Loralee Pacas, PharmD, BCPS Pager: [phone] 12/20/2011,3:44 PM

## 2011-12-20 NOTE — Transfer of Care (Signed)
Immediate Anesthesia Transfer of Care Note  Patient: [name]  Procedure(s) Performed: Procedure(s) (LRB): TOTAL KNEE ARTHROPLASTY (Left)  Patient Location: PACU  Anesthesia Type: General  Level of Consciousness: awake, alert , patient cooperative and responds to stimulation  Airway & Oxygen Therapy: Patient Spontanous Breathing and Patient connected to face mask oxygen  Post-op Assessment: Report given to PACU RN, Post -op Vital signs reviewed and stable and Patient moving all extremities  Post vital signs: Reviewed and stable  Complications: No apparent anesthesia complications

## 2011-12-20 NOTE — Anesthesia Preprocedure Evaluation (Addendum)
Anesthesia Evaluation  Patient identified by MRN, date of birth, ID band Patient awake    Reviewed: Allergy & Precautions, H&P , NPO status , Patient's Chart, lab work & pertinent test results  Airway Mallampati: II TM Distance: <3 FB Neck ROM: Limited    Dental No notable dental hx.    Pulmonary neg pulmonary ROS,  breath sounds clear to auscultation  Pulmonary exam normal       Cardiovascular hypertension, DVT + dysrhythmias Atrial Fibrillation Rhythm:Regular Rate:Normal     Neuro/Psych negative neurological ROS  negative psych ROS   GI/Hepatic Neg liver ROS, GERD-  ,  Endo/Other  negative endocrine ROS  Renal/GU negative Renal ROS  negative genitourinary   Musculoskeletal negative musculoskeletal ROS (+)   Abdominal   Peds negative pediatric ROS (+)  Hematology negative hematology ROS (+)   Anesthesia Other Findings   Reproductive/Obstetrics negative OB ROS                          Anesthesia Physical Anesthesia Plan  ASA: III  Anesthesia Plan: General   Post-op Pain Management:    Induction: Intravenous  Airway Management Planned: Oral ETT  Additional Equipment:   Intra-op Plan:   Post-operative Plan: Extubation in OR  Informed Consent: I have reviewed the patients History and Physical, chart, labs and discussed the procedure including the risks, benefits and alternatives for the proposed anesthesia with the patient or authorized representative who has indicated his/her understanding and acceptance.   Dental advisory given  Plan Discussed with: CRNA  Anesthesia Plan Comments:         Anesthesia Quick Evaluation

## 2011-12-20 NOTE — Consult Note (Signed)
Primary Care Physician: Josue Hector, MD Referring Physician:  Dr Sherrie Sport is a 78 y.o. female well known to me with a h/o paroxysmal atrial fibrillation and bradycardia s/p SJM PPM implanted previously. She was last seen by Dr Diona Browner 6/13 at which time she was felt to be a good candidate for surgery.  She is now recovering following elective knee replacement with Dr. Darrelyn Hillock earlier today.  She remains sedated in recovery and is unable to provide a full history at this time.  She denies CP or SOB.  Her procedure appears to have been uneventful.  She is presently comfortable and without complaint.    Past Medical History  Diagnosis Date  . Arthritis   . Glaucoma   . Diverticulosis   . GERD (gastroesophageal reflux disease)   . Mixed hyperlipidemia   . Essential hypertension, benign   . Colon polyps   . Atrial fibrillation     Paroxysmal  . Atrial flutter     s/p RFA 2003  . Pneumonia     7/03, with encephalopathy  . Sinus bradycardia     s/p PPM (SJM)  . Varicose veins   . DVT, lower extremity   . IBS (irritable bowel syndrome)   . Osteoporosis   . Cancer     breast cancer 1980's - lumpectomy only  . Varicose veins   . Sleep apnea     STOP BANG SCORE 5   Past Surgical History  Procedure Date  . Cholecystectomy   . Pacemaker insertion 2003    Dual chamber [employer],  . Breast lumpectomy     Right breast   . Colonoscopy w/ biopsies and polypectomy 03/2003, 05/2008, 02/20/2011    severe diverticulosis, tubulovillous adenoma polyp, internal and external hemorrhoids 2012: severe diverticulosis, 4 mm polyp, hemorrhoids  . Cataracts   . Fractured wrist     repair    Current Facility-Administered Medications  Medication Dose Route Frequency Provider Last Rate Last Dose  . bupivacaine liposome (EXPAREL) 1.3 % injection 266 mg  20 mL Infiltration Once Jacki Cones, MD      . ceFAZolin (ANCEF) IVPB 2 g/50 mL premix  2 g Intravenous Once  Jacki Cones, MD   2 g at 12/20/11 1117  . HYDROmorphone (DILAUDID) 1 MG/ML injection           . HYDROmorphone (DILAUDID) injection 0.25-0.5 mg  0.25-0.5 mg Intravenous Q5 min PRN Eilene Ghazi, MD   0.25 mg at 12/20/11 1403  . lactated ringers infusion   Intravenous Continuous Amber Tamala Ser, PA      . methocarbamol (ROBAXIN) tablet 500 mg  500 mg Oral Q6H PRN Jacki Cones, MD       Or  . methocarbamol (ROBAXIN) 500 mg in dextrose 5 % 50 mL IVPB  500 mg Intravenous Q6H PRN Jacki Cones, MD   500 mg at 12/20/11 1405  . promethazine (PHENERGAN) injection 6.25-12.5 mg  6.25-12.5 mg Intravenous Q15 min PRN Eilene Ghazi, MD      . DISCONTD: bacitracin ointment    PRN Jacki Cones, MD   1 application at 12/20/11 1257  . DISCONTD: bupivacaine liposome (EXPAREL) 1.3 % injection    PRN Jacki Cones, MD   40 mL at 12/20/11 1300  . DISCONTD: polymyxin B 500,000 Units, bacitracin 50,000 Units in sodium chloride irrigation 0.9 % 500 mL irrigation    PRN Jacki Cones, MD      .  DISCONTD: sodium chloride irrigation 0.9 %    PRN Jacki Cones, MD   3,000 mL at 12/20/11 1051  . DISCONTD: thrombin spray    PRN Jacki Cones, MD   5,000 Units at 12/20/11 1050   Facility-Administered Medications Ordered in Other Encounters  Medication Dose Route Frequency Provider Last Rate Last Dose  . DISCONTD: acetaminophen (OFIRMEV) IV    PRN Lattie Haw, CRNA   1,000 mg at 12/20/11 1026  . DISCONTD: dexamethasone (DECADRON) injection    PRN Lattie Haw, CRNA   10 mg at 12/20/11 1110  . DISCONTD: ePHEDrine injection    PRN Lattie Haw, CRNA   5 mg at 12/20/11 1128  . DISCONTD: fentaNYL (SUBLIMAZE) injection    PRN Doran Clay, CRNA   12.5 mcg at 12/20/11 1330  . DISCONTD: glycopyrrolate (ROBINUL) injection    PRN Lattie Haw, CRNA   0.3 mg at 12/20/11 1243  . DISCONTD: HYDROmorphone (DILAUDID) injection    PRN Lattie Haw, CRNA   0.25 mg at 12/20/11 1330  . DISCONTD: ketamine (KETALAR) injection    PRN Lattie Haw, CRNA   2.5 mg at 12/20/11 1230  . DISCONTD: lidocaine (cardiac) 100 mg/65ml (XYLOCAINE) 20 MG/ML injection 2%    PRN Doran Clay, CRNA   50 mg at 12/20/11 1058  . DISCONTD: metoCLOPramide (REGLAN) injection    PRN Lattie Haw, CRNA   10 mg at 12/20/11 1124  . DISCONTD: neostigmine (PROSTIGMINE) injection   Intravenous PRN Lattie Haw, CRNA   2 mg at 12/20/11 1243  . DISCONTD: ondansetron (ZOFRAN) injection    PRN Lattie Haw, CRNA   4 mg at 12/20/11 1244  . DISCONTD: propofol (DIPRIVAN) 10 mg/ml infusion    PRN Doran Clay, CRNA   150 mg at 12/20/11 1058  . DISCONTD: rocuronium (ZEMURON) injection    PRN Lattie Haw, CRNA   30 mg at 12/20/11 1117  . DISCONTD: succinylcholine (ANECTINE) injection    PRN Doran Clay, CRNA   100 mg at 12/20/11 1058    No Known Allergies  History   Social History  . Marital Status: Widowed    Spouse Name: N/A    Number of Children: N/A  . Years of Education: N/A   Occupational History  . Retired    Social History Main Topics  . Smoking status: Never Smoker   . Smokeless tobacco: Never Used  . Alcohol Use: No  . Drug Use: No  . Sexually Active: Not on file   Other Topics Concern  . Not on file   Social History Narrative  . No narrative on file    Family History  Problem Relation Age of Onset  . Colon cancer Brother   . Colon cancer Other     Grandfather  . Cancer Father   . Stroke Mother   . Heart attack      CHF grandmother    ROS- pt is sedated and unable to provide  Physical Exam: Filed Vitals:   12/20/11 1430 12/20/11 1445 12/20/11 1455 12/20/11 1500  BP: 108/54 110/59  121/57  Pulse: 59 60 60 59  Temp:      TempSrc:      Resp: 9 12 11 11   SpO2: 97% 97% 99% 96%    GEN- The patient is sleeping post operatively but  arouses and is able to answer  a few questions   Head- normocephalic, atraumatic Eyes-  Sclera clear, conjunctiva pink Ears- hearing intact Oropharynx- clear Neck- supple,   Lungs- Clear to ausculation bilaterally, normal work of breathing Heart- Regular rate and rhythm, no murmurs, rubs or gallops, PMI not laterally displaced GI- soft, NT, ND, + BS Extremities- no clubbing, cyanosis, or edema Skin- no rash or lesion Neuro- strength and sensation appear grossly intact  Telemetry- atrial pacing  Assessment and Plan: 1. Atrial fibrillation- maintaining sinus rhythm postoperatively Resume amiodarone 100mg  daily Resume coumadin when surgically able Stop aspirin  2. HTN- stable Resume home antihypertensives as BP allows  3. Bradycardia-  Atrial pacing on telemetry No further evaluation is required at this time  Resume nadolol perioperatively  We will be available as needed. Please call with questions.  Fayrene Fearing Parthiv Mucci,MD

## 2011-12-20 NOTE — H&P (Signed)
Anderson B. Kohrs DOB: 1928/11/12   Chief Complaint: left knee pain   History of Present Illness The patient is a 78 year old female who comes in today for a preoperative History and Physical. The patient is scheduled for a left total knee arthroplasty to be performed by Dr. Georges Lynch. Darrelyn Hillock, MD at [facility] on Wednesday December 20, 2011 . She has been followed by Dr. Jeannetta Ellis office for a couple years. She has had left knee pain, pain with weightbearing, locking, catching, and instability in the left knee. She has had cortisone injections as well as viscosupplementation, all of which provided her minimal temporary relief. Due to failure of conservative treatment, the most predictable means for increased function and decreased pain in the left knee is a left total knee arthroplasty. Risks and benefits discussed.    Problem List/Past Medical Osteoarthritis, Knee (715.96) Cataract Pneumonia. 2011 Hypertension Hypercholesterolemia Pacemaker Atrial Fibrillation Varicose veins Hemorrhoids Measles Mumps  Allergies No Known Drug Allergies.    Family History Father. deceased age 59 due to lung cancer Mother. deceased age 75 due to stroke Siblings. brother- deceased age 38 due to colon cancer, lung cancer   Social History Tobacco / smoke exposure. no Illicit drug use. no Marital status. widowed Number of flights of stairs before winded. 1 Tobacco use. never smoker Pain Contract. no Drug/Alcohol Rehab (Previously). no Alcohol use. never consumed alcohol Current work status. retired Exercise. Exercises rarely Drug/Alcohol Rehab (Currently). no Living situation. live alone Children. 5 or more Post-Surgical Plans. SNF- [facility]. none   Medication History AmLODIPine Besylate (2.5MG  Tablet, Oral) Active. Livalo (4MG  Tablet, Oral) Active. Lumigan (0.03% Solution, Ophthalmic) Active. Nadolol (40MG   Tablet, Oral) Active. Pacerone (200MG  Tablet, Oral) Active. Warfarin Sodium (5MG  Tablet, Oral) Active. Melatonin + L-Theanine ( Oral) Active.    Past Surgical History Colon Polyp Removal - Colonoscopy Gallbladder Surgery. laporoscopic 2003 Arthroscopy of Knee. left Cataract Surgery. bilateral Wrist Surgery. 2011 Cataract Extraction-Bilateral     Review of Systems General:Not Present- Chills, Fever, Night Sweats, Fatigue, Weight Gain, Weight Loss and Memory Loss. Skin:Not Present- Hives, Itching, Rash, Eczema and Lesions. HEENT:Not Present- Tinnitus, Headache, Double Vision, Visual Loss, Hearing Loss and Dentures. Respiratory:Present- Shortness of breath with exertion. Not Present- Shortness of breath at rest, Allergies, Coughing up blood and Chronic Cough. Cardiovascular:Not Present- Chest Pain, Racing/skipping heartbeats, Difficulty Breathing Lying Down, Murmur, Swelling and Palpitations. Gastrointestinal:Not Present- Bloody Stool, Heartburn, Abdominal Pain, Vomiting, Nausea, Constipation, Diarrhea, Difficulty Swallowing, Jaundice and Loss of appetitie. Female Genitourinary:Not Present- Blood in Urine, Urinary frequency, Weak urinary stream, Discharge, Flank Pain, Incontinence, Painful Urination, Urgency, Urinary Retention and Urinating at Night. Musculoskeletal:Present- Joint Swelling and Joint Pain. Not Present- Muscle Weakness, Muscle Pain, Back Pain, Morning Stiffness and Spasms. Neurological:Present- Tremor and Difficulty with balance. Not Present- Blackout spells, Paralysis and Weakness. Psychiatric:Not Present- Insomnia.   Vitals Weight: 197 lb Height: 65 in Body Surface Area: 2.02 m Body Mass Index: 32.78 kg/m Pulse: 60 (Regular) Resp.: 18 (Unlabored) BP: 146/88 (Sitting, Left Arm, Standard)    Physical Exam General Mental Status - Alert, cooperative and good historian. General Appearance- pleasant. Not in acute distress.  Orientation- Oriented X3. Build & Nutrition- Overweight, Well nourished and Well developed. Head and Neck Head- normocephalic, atraumatic . Neck Global Assessment- supple. no bruit auscultated on the right and no bruit auscultated on the left. Eye Pupil- Bilateral- Regular and Round. Motion- Bilateral- EOMI. Chest and Lung Exam Auscultation: Breath sounds:- clear at anterior chest wall and -  clear at posterior chest wall. Adventitious sounds:- No Adventitious sounds. Cardiovascular Auscultation:Rhythm- Regular rate and rhythm. Heart Sounds- S1 WNL and S2 WNL. Murmurs & Other Heart Sounds:Auscultation of the heart reveals - No Murmurs. Abdomen Palpation/Percussion:Tenderness- Abdomen is non-tender to palpation. Rigidity (guarding)- Abdomen is soft. Auscultation:Auscultation of the abdomen reveals - Bowel sounds normal. Female Genitourinary Not done, not pertinent to present illness Peripheral Vascular Upper Extremity: Palpation:- Pulses bilaterally normal. Lower Extremity: Palpation:- Pulses bilaterally normal. Neurologic Examination of related systems reveals - normal muscle strength and tone in all extremities. Neurologic evaluation reveals - normal sensation and upper and lower extremity deep tendon reflexes intact bilaterally . Musculoskeletal Physical exam shows a painful ROM of her left knee. She has a genu varus. Moderate knee effusion. She does have some varicosities in her leg. Normal painless ROM in the hips.    RADIOGRAPHS: AP of both knees and lateral of the left knee show she has medial collapse. Medial joint on the opposite right side, complete collapse.   Assessment & Plan Osteoarthritis, Knee (715.96) Left total knee arthroplasty   Dimitri Ped, PA-C

## 2011-12-20 NOTE — Anesthesia Postprocedure Evaluation (Signed)
  Anesthesia Post-op Note  Patient: [name]  Procedure(s) Performed: Procedure(s) (LRB): TOTAL KNEE ARTHROPLASTY (Left)  Patient Location: PACU  Anesthesia Type: General  Level of Consciousness: awake and alert   Airway and Oxygen Therapy: Patient Spontanous Breathing  Post-op Pain: mild  Post-op Assessment: Post-op Vital signs reviewed, Patient's Cardiovascular Status Stable, Respiratory Function Stable, Patent Airway and No signs of Nausea or vomiting  Post-op Vital Signs: stable  Complications: No apparent anesthesia complications

## 2011-12-20 NOTE — Progress Notes (Signed)
UR COMPLETED  

## 2011-12-21 ENCOUNTER — Encounter (HOSPITAL_COMMUNITY): Payer: Self-pay | Admitting: Orthopedic Surgery

## 2011-12-21 LAB — BASIC METABOLIC PANEL
BUN: 13 mg/dL (ref 6–23)
Chloride: 100 mEq/L (ref 96–112)
GFR calc Af Amer: >90 mL/min (ref >90)
GFR calc non Af Amer: 79 mL/min — ABNORMAL LOW (ref >90)
Potassium: 4.6 mEq/L (ref 3.5–5.1)
Sodium: 134 mEq/L — ABNORMAL LOW (ref 135–145)

## 2011-12-21 LAB — CBC
HCT: 33.2 % — ABNORMAL LOW (ref 36.0–46.0)
MCHC: 33.4 g/dL (ref 30.0–36.0)
RDW: 13 % (ref 11.5–15.5)
WBC: 5.9 10*3/uL (ref 4.0–10.5)

## 2011-12-21 LAB — PROTIME-INR
INR: 1.12 (ref 0.00–1.49)
Prothrombin Time: 14.6 seconds (ref 11.6–15.2)

## 2011-12-21 MED ORDER — ZOLPIDEM TARTRATE 10 MG PO TABS: 10.0000 mg | ORAL_TABLET | Freq: Every evening | ORAL | Status: DC | PRN

## 2011-12-21 MED ORDER — WARFARIN SODIUM 5 MG PO TABS
5.0000 mg | ORAL_TABLET | Freq: Once | ORAL | Status: AC
  Administered 2011-12-21: 5 mg via ORAL
  Filled 2011-12-21 (×2): qty 1

## 2011-12-21 MED ORDER — ZOLPIDEM TARTRATE 5 MG PO TABS: 5.0000 mg | ORAL_TABLET | Freq: Every evening | ORAL | Status: DC | PRN

## 2011-12-21 NOTE — Progress Notes (Signed)
Spoke with Dr Darrelyn Hillock who is currently in surgery at [facility] and he does not want patient on a CPM machine. Ginny Forth

## 2011-12-21 NOTE — Op Note (Signed)
NAMECHRISTIANA, GUREVICH           ACCOUNT NO.:  [id number]  MEDICAL RECORD NO.:  [id number]  LOCATION:  1407                         FACILITY:  [facility]  PHYSICIAN:  Georges Lynch. Particia Strahm, M.D.DATE OF BIRTH:  1929/04/10  DATE OF PROCEDURE:  12/20/2011 DATE OF DISCHARGE:                              OPERATIVE REPORT   SURGEON:  Georges Lynch. Kapri Nero, MD  ASSISTANT:  Dimitri Ped, PA  PREOPERATIVE DIAGNOSIS:  Severe genu varus and severe degenerative arthritis, left knee.  POSTOPERATIVE DIAGNOSIS:  Severe genu varus and severe degenerative arthritis, left knee.  OPERATION:  Left total knee arthroplasty utilizing DePuy system, all 3 components were cemented.  Gentamicin was used in the cement.  Sizes used were 4 narrow left femur, the tibia was a size 4, the tibial insert size 4, 10-mm thickness, the patella was a size 41 with 3 pegs.  DESCRIPTION OF PROCEDURE:  Under general anesthesia, routine orthopedic prep and draping the left lower extremity was carried out.  The patient's leg was placed in the leg holder after the appropriate time- out was carried out.  I also marked the appropriate left leg in the holding area.  At this time, the leg was exsanguinated and Esmarch tourniquet was elevated to 350 mmHg.  The knee was flexed and incision was made over the anterior aspect of the left knee.  Bleeders identified and cauterized.  Two flaps were created.  I then carried out a median patellar incision, reflecting patella laterally, I then did medial and lateral meniscectomies and excised the anterior-posterior cruciate ligaments.  Following that, I then made my initial drill hole in the intercondylar notch.  I removed 12-mm thickness off the distal femur. She had rather significant contracture at that time.  I then measured the femur to be a size 4 and we felt at this time and looking at the femur a 4 narrow would be best, so I then cut the femur for a size 4. Anterior-posterior chamfer  cuts were carried out in usual fashion. After the femur was cut for a size 4, I then prepared the tibia.  I made an enter in the tibial plateau.  We made our initial drill hole, inserted our guide rod.  Both for the tibial side and the femoral side, we thoroughly irrigated out the canal.  I then removed 6-mm thickness off the affected medial side of the tibia in usual fashion.  The appropriate lining guides were used.  After the tibia was prepared, I then checked the posterior condyles of the femur, removed __________ medially.  I did utilize the lamina spreaders to examine the posterior femoral condyle.  Thoroughly irrigated out the area, I then keel cut of the proximal tibia after I utilized __________ guide.  Inspection of the wound __________ at this particular time.  After preparing the tibia, I then prepared the femur by cutting my notch cut.  I then inserted the appropriate trial prostheses, the 4 narrow fit quite nicely, so we selected that.  I then did a resurfacing procedure on the patella for a size 41 patella.  Three drill holes were made in the articular surface of the patella.  I removed all trial components.  Following that, we  thoroughly water picked the knee, cemented all 3 components and simultaneously utilizing gentamicin in the cement.  After that, all loose pieces of cement were removed.  I then injected half of my mixture of 20 mL of Exparel and 20 mL of normal saline.  I injected out all the soft tissue structures.  The latter half then was used at the final closing.  After all cement was removed, we finally selected a size 4 permanent rotating platform, 10 mm thickness, I inserted that, but I first made sure all loose pieces of cement were removed and we thoroughly water picked out the knee for any other loose pieces.  At that particular time, the knee was reduced.  We had nice flexion and extension, good lateral medial stability.  I reapproximated all the soft  tissue structures over Hemovac drain.  The subcu was closed in usual fashion. Skin was closed in the usual fashion.  Sterile dressings were applied.          ______________________________ Georges Lynch Darrelyn Hillock, M.D.     RAG/MEDQ  D:  12/20/2011  T:  12/21/2011  Job:  [id number]

## 2011-12-21 NOTE — Progress Notes (Signed)
12/21/11 1306  PT Visit Information  Last PT Received On 12/21/11  PT Time Calculation  PT Start Time 1310  PT Stop Time 1332  PT Time Calculation (min) 22 min  Subjective Data  Subjective pt back to bed with OT  Precautions  Precautions Knee  Cognition  Overall Cognitive Status Appears within functional limits for tasks assessed/performed  Arousal/Alertness Awake/alert  Total Joint Exercises  Ankle Circles/Pumps AROM;Both;10 reps  Quad Sets AROM;Both;10 reps  Short Arc [employer];Left;10 reps  Heel Slides AAROM;Both;10 reps  Hip ABduction/ADduction AAROM;Left;10 reps  Straight Leg Raises AAROM;Left;10 reps  PT - End of Session  Activity Tolerance Patient tolerated treatment well  Patient left in bed;with call bell/phone within reach;with family/visitor present  Nurse Communication Mobility status;Other (comment) (no CPM ordered--RN to call Dr. Reece Agar)  PT - Assessment/Plan  Comments on Treatment Session progressing well, cooperative and participated with ther ex this pm;  pain 6/10, RN notified  PT Plan Discharge plan remains appropriate;Frequency remains appropriate  PT Frequency 7X/week  Equipment Recommended Defer to next venue  Acute Rehab PT Goals  Time For Goal Achievement 12/28/11  Potential to Achieve Goals Good  Pt will Perform Home Exercise Program with min assist  PT Goal: Perform Home Exercise Program - Progress Progressing toward goal  PT General Charges  $$ ACUTE PT VISIT 1 Procedure  PT Treatments  $Therapeutic Exercise 8-22 mins

## 2011-12-21 NOTE — Progress Notes (Signed)
Noted by PT that patient has no order for CPM. Dr Jeannetta Ellis office called and they will page him regarding this issue. Ginny Forth

## 2011-12-21 NOTE — Progress Notes (Signed)
Subjective: Hemovac Dcd. Will ambulate today.Hbg. 11.1.She is on Coumadin-Heparin Protocol.   Objective: Vital signs in last 24 hours: Temp:  [97 F (36.1 C)-98.4 F (36.9 C)] 98.3 F (36.8 C) (07/11 0623) Pulse Rate:  [59-68] 68  (07/11 0623) Resp:  [7-18] 18  (07/11 0623) BP: (101-162)/(46-87) 115/76 mmHg (07/11 0623) SpO2:  [94 %-100 %] 100 % (07/11 0623) Weight:  [88 kg (194 lb 0.1 oz)] 88 kg (194 lb 0.1 oz) (07/10 1523)  Intake/Output from previous day: 07/10 0701 - 07/11 0700 In: 2645 [P.O.:240; I.V.:2250; IV Piggyback:155] Out: 1370 [Urine:560; Drains:760; Blood:50] Intake/Output this shift:     Basename 12/21/11 0515  HGB 11.1*    Basename 12/21/11 0515  WBC 5.9  RBC 3.37*  HCT 33.2*  PLT 171    Basename 12/21/11 0515  NA 134*  K 4.6  CL 100  CO2 29  BUN 13  CREATININE 0.72  GLUCOSE 116*  CALCIUM 8.6    Basename 12/21/11 0515 12/20/11 0830  LABPT -- --  INR 1.12 1.09    Dorsiflexion/Plantar flexion intact  Assessment/Plan: SNF Saturday or Monday.   Jaki Steptoe A 12/21/2011, 7:23 AM

## 2011-12-21 NOTE — Progress Notes (Signed)
ANTICOAGULATION CONSULT NOTE - Follow Up  Pharmacy Consult for Warfarin Indication: Afib, VTE prophylaxis s/p left TKA  No Known Allergies  Patient Measurements: Height: 5\' 3"  (160 cm) Weight: 194 lb 0.1 oz (88 kg) IBW/kg (Calculated) : 52.4   Vital Signs: Temp: 98.3 F (36.8 C) (07/11 0623) Temp src: Oral (07/11 0623) BP: 115/76 mmHg (07/11 0623) Pulse Rate: 68  (07/11 0623)  Labs:  Basename 12/21/11 0515 12/20/11 0830  HGB 11.1* --  HCT 33.2* --  PLT 171 --  APTT -- --  LABPROT 14.6 14.3  INR 1.12 1.09  HEPARINUNFRC -- --  CREATININE 0.72 --  CKTOTAL -- --  CKMB -- --  TROPONINI -- --    Estimated Creatinine Clearance: 59 ml/min (by C-G formula based on Cr of 0.72).   Medications:  Scheduled:    . amiodarone  100 mg Oral QPM  . amLODipine  2.5 mg Oral Q breakfast  . atorvastatin  10 mg Oral q1800  . bimatoprost  1 drop Both Eyes QHS  .  ceFAZolin (ANCEF) IV  1 g Intravenous Q6H  .  ceFAZolin (ANCEF) IV  2 g Intravenous Once  . cyanocobalamin  1,000 mcg Intramuscular Q30 days  . ferrous sulfate  325 mg Oral TID PC  . heparin  5,000 Units Subcutaneous Q12H  . HYDROmorphone      . nadolol  40 mg Oral Q breakfast  . warfarin  7.5 mg Oral ONCE-1800  . Warfarin - Pharmacist Dosing Inpatient   Does not apply q1800  . DISCONTD: amiodarone  100 mg Oral QPM  . DISCONTD: bupivacaine liposome  20 mL Infiltration Once  . DISCONTD: Melatonin  1 capsule Oral QHS   Infusions:    . lactated ringers 100 mL/hr at 12/21/11 0206  . DISCONTD: lactated ringers 100 mL/hr at 12/20/11 1514    Assessment: 82 YOM on chronic warfarin/amiodarone for PAF. Home dose reported as 5mg  daily, last dose taken 7/4 in anticipation of surgery. Warfarin dosing resumed 7/10pm with 1.5x usual home dose. INR subtherapeutic today as expected. No bleeding reported in chart notes. Plan to send to SNF Sat or Monday noted. Also on SQ heparin until INR>2.  Goal of Therapy:  INR 2-3   Plan:  1)  Warfarin 5mg  PO x1 at 18:00 2) Daily INR.  Darrol Angel, PharmD Pager: [phone] 12/21/2011,8:23 AM

## 2011-12-21 NOTE — Evaluation (Signed)
Occupational Therapy Evaluation Patient Details Name: [name] MRN: [id number] DOB: 17-Feb-1929 Today's Date: 12/21/2011 Time: 1430-1500 OT Time Calculation (min): 30 min  OT Assessment / Plan / Recommendation Clinical Impression  Pt presents with a L TKR POD 1. Skilled OT recommended to maximize I w/bADLs to min-mod A level in prep for d/c to next venue of care.    OT Assessment  Patient needs continued OT Services    Follow Up Recommendations  Skilled nursing facility    Barriers to Discharge Decreased caregiver support;Inaccessible home environment    Equipment Recommendations  Defer to next venue    Recommendations for Other Services    Frequency  Min 1X/week    Precautions / Restrictions Precautions Precautions: Knee Required Braces or Orthoses: Knee Immobilizer - Left Knee Immobilizer - Left: Discontinue once straight leg raise with < 10 degree lag   Pertinent Vitals/Pain     ADL  Grooming: Simulated;Set up Where Assessed - Grooming: Unsupported sitting Upper Body Bathing: Simulated;Set up Where Assessed - Upper Body Bathing: Unsupported sitting Lower Body Bathing: Simulated;+2 Total assistance Lower Body Bathing: Patient Percentage: 30% Where Assessed - Lower Body Bathing: Supported sit to stand Upper Body Dressing: Simulated;Set up Where Assessed - Upper Body Dressing: Unsupported sitting Lower Body Dressing: +2 Total assistance Lower Body Dressing: Patient Percentage: 20% Where Assessed - Lower Body Dressing: Supported sit to stand Toilet Transfer: Simulated;+2 Total assistance Toilet Transfer: Patient Percentage: 50% Statistician Method: Surveyor, minerals: Other (comment) (recliner back to bed.) Toileting - Clothing Manipulation and Hygiene: Simulated;+2 Total assistance Toileting - Clothing Manipulation and Hygiene: Patient Percentage: 20% Where Assessed - Glass blower/designer Manipulation and Hygiene: Standing Equipment  Used: Rolling walker Transfers/Ambulation Related to ADLs: Pt declined any further activtiy due to fatigue.     OT Diagnosis: Generalized weakness  OT Problem List: Decreased activity tolerance;Decreased safety awareness;Decreased knowledge of use of DME or AE;Pain OT Treatment Interventions: Self-care/ADL training;Therapeutic activities;DME and/or AE instruction;Patient/family education   OT Goals Acute Rehab OT Goals OT Goal Formulation: With patient/family Time For Goal Achievement: 12/28/11 Potential to Achieve Goals: Good ADL Goals Pt Will Perform Grooming: with min assist;Standing at sink ADL Goal: Grooming - Progress: Goal set today Pt Will Perform Lower Body Bathing: with min assist;Sit to stand from chair;Sit to stand from bed ADL Goal: Lower Body Bathing - Progress: Goal set today Pt Will Perform Lower Body Dressing: with mod assist;Sit to stand from bed;Sit to stand from chair ADL Goal: Lower Body Dressing - Progress: Goal set today Pt Will Transfer to Toilet: with min assist;Ambulation;Stand pivot transfer;3-in-1;Comfort height toilet ADL Goal: Toilet Transfer - Progress: Goal set today Pt Will Perform Toileting - Clothing Manipulation: with min assist;Standing ADL Goal: Toileting - Clothing Manipulation - Progress: Goal set today Pt Will Perform Toileting - Hygiene: with min assist;Sit to stand from 3-in-1/toilet ADL Goal: Toileting - Hygiene - Progress: Goal set today  Visit Information  Last OT Received On: 12/21/11 Assistance Needed: +2    Subjective Data  Subjective: How am I doing? Patient Stated Goal: Not asked   Prior Functioning  Home Living Lives With: Alone Available Help at Discharge: Skilled Nursing Facility Home Adaptive Equipment: Bedside commode/3-in-1;Walker - rolling;Straight cane    Prior Function Level of Independence: Independent with assistive device(s) Driving: Yes Vocation: Retired Musician: No difficulties      Cognition  Overall Cognitive Status: Appears within functional limits for tasks assessed/performed Arousal/Alertness: Awake/alert Orientation Level: Appears intact for tasks assessed  Behavior During Session: [facility] for tasks performed    Extremity/Trunk Assessment Right Upper Extremity Assessment RUE ROM/Strength/Tone: [facility] for tasks assessed Left Upper Extremity Assessment LUE ROM/Strength/Tone: [facility] for tasks assessed    Mobility Bed Mobility Bed Mobility: Sit to Supine;Scooting to HOB Sit to Supine: 1: +2 Total assist Sit to Supine: Patient Percentage: 30% Scooting to HOB: 3: Mod assist;With trapeze Details for Bed Mobility Assistance: Max VCs for sequencing and technique. Transfers Sit to Stand: 1: +2 Total assist;From chair/3-in-1;With upper extremity assist Sit to Stand: Patient Percentage: 50% Stand to Sit: 1: +2 Total assist;To bed;With upper extremity assist Stand to Sit: Patient Percentage: 50% Details for Transfer Assistance: Max VCs for hand placement and LLE management. Pt has a tendency to lean posteriorly upon initially standing.   Exercise   Balance    End of Session OT - End of Session Activity Tolerance: Patient tolerated treatment well Patient left: in bed;with call bell/phone within reach;with family/visitor present Nurse Communication: Mobility status  GO     Jannetta Massey A OTR/L [phone] 12/21/2011, 1:08 PM

## 2011-12-21 NOTE — Evaluation (Addendum)
Physical Therapy Evaluation Patient Details Name: [name] MRN: [id number] DOB: 1928-09-21 Today's Date: 12/21/2011 Time: 4098-1191 PT Time Calculation (min): 35 min  PT Assessment / Plan / Recommendation Clinical Impression  pt s/p left TKA and will benefit from PT to maximize independence for skilled setting post acute    PT Assessment  Patient needs continued PT services    Follow Up Recommendations  Skilled nursing facility    Barriers to Discharge        Equipment Recommendations  Defer to next venue    Recommendations for Other Services     Frequency Min 7x/week    Precautions / Restrictions Precautions Precautions: Knee Required Braces or Orthoses: Knee Immobilizer - Left Knee Immobilizer - Left: Discontinue once straight leg raise with < 10 degree lag   Pertinent Vitals/Pain       Mobility  Bed Mobility Bed Mobility: Supine to Sit Details for Bed Mobility Assistance: mod assist for scooting, increased time, verbal cues for technique Transfers Transfers: Sit to Stand;Stand to Sit Sit to Stand: 1: +2 Total assist;From elevated surface Sit to Stand: Patient Percentage: 40% Stand to Sit: 1: +2 Total assist Stand to Sit: Patient Percentage: 40% Details for Transfer Assistance: cues for hand placement and wt shift; +2 for safety, wt shift, lines, balance Ambulation/Gait Ambulation/Gait Assistance: 3: Mod assist Ambulation Distance (Feet): 20 Feet Assistive device: Rolling walker Ambulation/Gait Assistance Details: cues for sequence, safety, posture Gait Pattern: Step-to pattern    Exercises Total Joint Exercises Ankle Circles/Pumps: AROM;Both;10 reps   PT Diagnosis: Difficulty walking  PT Problem List: Decreased strength;Decreased range of motion;Decreased activity tolerance;Decreased mobility;Decreased knowledge of use of DME;Pain PT Treatment Interventions: DME instruction;Gait training;Stair training;Functional mobility training;Therapeutic  activities;Therapeutic exercise;Balance training;Patient/family education   PT Goals Acute Rehab PT Goals PT Goal Formulation: With patient Time For Goal Achievement: 12/28/11 Potential to Achieve Goals: Good Pt will go Supine/Side to Sit: with min assist PT Goal: Supine/Side to Sit - Progress: Goal set today Pt will go Sit to Stand: with min assist PT Goal: Sit to Stand - Progress: Goal set today Pt will Ambulate: 51 - 150 feet;with min assist;with rolling walker PT Goal: Ambulate - Progress: Goal set today Pt will Perform Home Exercise Program: with min assist PT Goal: Perform Home Exercise Program - Progress: Goal set today  Visit Information  Last PT Received On: 12/21/11 Assistance Needed: +2 (for transfers)    Subjective Data  Subjective: not too bad Patient Stated Goal: rehab   Prior Functioning  Home Living Lives With: Alone Available Help at Discharge: Skilled Nursing Facility Additional Comments: Pt son very concrened about skilled nursing center and choice his mother made([facility]); informed son that we are not allowed to comment on SNF and that doing a site visit (as they did) and talking to people that have been there is the best way to find a suitable facility; Attempted to calm the family's anxiety by  informing them that most all facilities have excellent rehab/therapists and that his mother should do well in any area.   Prior Function Level of Independence:  (used cane sometimes) Communication Communication: No difficulties    Cognition  Overall Cognitive Status: Appears within functional limits for tasks assessed/performed Arousal/Alertness: Awake/alert Orientation Level: Appears intact for tasks assessed Behavior During Session: [facility] for tasks performed    Extremity/Trunk Assessment Right Upper Extremity Assessment RUE ROM/Strength/Tone: [facility] for tasks assessed Right Lower Extremity Assessment RLE ROM/Strength/Tone: [facility] for tasks assessed Left Lower  Extremity Assessment  LLE ROM/Strength/Tone: Deficits;Due to pain LLE ROM/Strength/Tone Deficits: able to minimally assist with SLR; ankle WFL (ROM painful)   Balance    End of Session PT - End of Session Equipment Utilized During Treatment: Gait belt Activity Tolerance: Patient tolerated treatment well Patient left: in chair;with call bell/phone within reach;with family/visitor present  GP     [facility] 12/21/2011, 12:49 PM

## 2011-12-22 LAB — CBC
MCV: 96.1 fL (ref 78.0–100.0)
Platelets: 141 10*3/uL — ABNORMAL LOW (ref 150–400)
RBC: 2.8 MIL/uL — ABNORMAL LOW (ref 3.87–5.11)
RDW: 12.6 % (ref 11.5–15.5)
WBC: 6.4 10*3/uL (ref 4.0–10.5)

## 2011-12-22 LAB — BASIC METABOLIC PANEL
CO2: 27 mEq/L (ref 19–32)
Chloride: 99 mEq/L (ref 96–112)
Creatinine, Ser: 0.72 mg/dL (ref 0.50–1.10)
GFR calc Af Amer: >90 mL/min (ref >90)
Potassium: 3.7 mEq/L (ref 3.5–5.1)
Sodium: 133 mEq/L — ABNORMAL LOW (ref 135–145)

## 2011-12-22 LAB — PROTIME-INR
INR: 1.46 (ref 0.00–1.49)
Prothrombin Time: 18 seconds — ABNORMAL HIGH (ref 11.6–15.2)

## 2011-12-22 MED ORDER — OXYCODONE-ACETAMINOPHEN 5-325 MG PO TABS: 2.0000 | ORAL_TABLET | ORAL | Status: AC | PRN

## 2011-12-22 MED ORDER — WARFARIN SODIUM 5 MG PO TABS
5.0000 mg | ORAL_TABLET | Freq: Once | ORAL | Status: AC
  Administered 2011-12-22: 5 mg via ORAL
  Filled 2011-12-22: qty 1

## 2011-12-22 MED ORDER — METHOCARBAMOL 500 MG PO TABS: 500.0000 mg | ORAL_TABLET | Freq: Four times a day (QID) | ORAL | Status: AC | PRN

## 2011-12-22 MED ORDER — WARFARIN SODIUM 5 MG PO TABS: 5.0000 mg | ORAL_TABLET | Freq: Every evening | ORAL | Status: DC

## 2011-12-22 MED ORDER — LACTATED RINGERS IV BOLUS (SEPSIS)
500.0000 mL | Freq: Once | INTRAVENOUS | Status: AC
  Administered 2011-12-22: 500 mL via INTRAVENOUS

## 2011-12-22 NOTE — Progress Notes (Signed)
Patient BP 90/57, Hr 59. Pt is alert and oriented and asymptomatic. MD on call for Dr. Darrelyn Hillock notified. New order for 500 cc of LR obtained over the phone and administered. Current BP is 109/69. HR= 60. Will continue to monitor

## 2011-12-22 NOTE — Progress Notes (Signed)
ANTICOAGULATION CONSULT NOTE - Follow Up  Pharmacy Consult for Warfarin Indication: Afib, VTE prophylaxis s/p left TKA  No Known Allergies  Patient Measurements: Height: 5\' 3"  (160 cm) Weight: 194 lb 0.1 oz (88 kg) IBW/kg (Calculated) : 52.4   Vital Signs: Temp: 97.4 F (36.3 C) (07/12 0624) Temp src: Oral (07/12 0624) BP: 109/69 mmHg (07/12 0624) Pulse Rate: 60  (07/12 0624)  Labs:  Basename 12/22/11 0447 12/21/11 0515 12/20/11 0830  HGB 9.3* 11.1* --  HCT 26.9* 33.2* --  PLT 141* 171 --  APTT -- -- --  LABPROT 18.0* 14.6 14.3  INR 1.46 1.12 1.09  HEPARINUNFRC -- -- --  CREATININE 0.72 0.72 --  CKTOTAL -- -- --  CKMB -- -- --  TROPONINI -- -- --    Estimated Creatinine Clearance: 59 ml/min (by C-G formula based on Cr of 0.72).   Medications:  Scheduled:     . amiodarone  100 mg Oral QPM  . amLODipine  2.5 mg Oral Q breakfast  . atorvastatin  10 mg Oral q1800  . bimatoprost  1 drop Both Eyes QHS  . cyanocobalamin  1,000 mcg Intramuscular Q30 days  . ferrous sulfate  325 mg Oral TID PC  . heparin  5,000 Units Subcutaneous Q12H  . lactated ringers  500 mL Intravenous Once  . nadolol  40 mg Oral Q breakfast  . warfarin  5 mg Oral ONCE-1800  . Warfarin - Pharmacist Dosing Inpatient   Does not apply q1800   Infusions:     . lactated ringers 100 mL/hr at 12/21/11 1111    Assessment: 82 YOM on chronic warfarin/amiodarone for PAF. Home dose reported as 5mg  daily, last dose taken 7/4 in anticipation of surgery. Warfarin dosing resumed 7/10pm with 1.5x usual home dose. INR subtherapeutic today as expected, but progressing towards goal 2-3. No bleeding reported in chart notes. Plan to send to SNF on Monday noted. Also on SQ heparin until INR>2.  Goal of Therapy:  INR 2-3   Plan:  1) Warfarin 5mg  PO x1 at 18:00 2) Daily INR.  Darrol Angel, PharmD Pager: [phone] 12/22/2011,11:42 AM

## 2011-12-22 NOTE — Progress Notes (Signed)
Physical Therapy Treatment Patient Details Name: [name] MRN: [id number] DOB: 1928-06-27 Today's Date: 12/22/2011 Time: 9562-1308 PT Time Calculation (min): 26 min  PT Assessment / Plan / Recommendation Comments on Treatment Session  Pt doing better today with mobility and requiring less assist.  Pt assisted to bathroom to void after ambulating then to recliner.    Follow Up Recommendations  Skilled nursing facility    Barriers to Discharge        Equipment Recommendations  Defer to next venue    Recommendations for Other Services    Frequency     Plan Discharge plan remains appropriate;Frequency remains appropriate    Precautions / Restrictions Precautions Precautions: Knee Required Braces or Orthoses: Knee Immobilizer - Left Knee Immobilizer - Left: Discontinue once straight leg raise with < 10 degree lag   Pertinent Vitals/Pain No pain at rest however pt reported pain increased to 5/10 with ambulation, repositioned and RN notified    Mobility  Bed Mobility Bed Mobility: Supine to Sit Supine to Sit: 3: Mod assist Details for Bed Mobility Assistance: increased cues for sequence and technique, use of UEs on rails to assist as well as pull up by hand, assist for trunk and L LE Transfers Transfers: Sit to Stand;Stand to Sit Sit to Stand: 4: Min assist;From chair/3-in-1;From bed;With upper extremity assist Stand to Sit: 4: Min assist;With armrests;With upper extremity assist;To chair/3-in-1 Details for Transfer Assistance: verbal cues for L LE forward and hand placement, assist for weakness Ambulation/Gait Ambulation/Gait Assistance: 4: Min assist Ambulation Distance (Feet): 70 Feet Assistive device: Rolling walker Ambulation/Gait Assistance Details: verbal cues for step length, sequence, posture Gait Pattern: Step-to pattern;Antalgic Gait velocity: decreased    Exercises     PT Diagnosis:    PT Problem List:   PT Treatment Interventions:     PT  Goals Acute Rehab PT Goals PT Goal: Supine/Side to Sit - Progress: Progressing toward goal Pt will go Sit to Stand: with supervision PT Goal: Sit to Stand - Progress: Updated due to goal met PT Goal: Ambulate - Progress: Progressing toward goal  Visit Information  Last PT Received On: 12/22/11 Assistance Needed: +1    Subjective Data  Subjective: Pt pleased with ambulation progress.   Cognition  Overall Cognitive Status: Appears within functional limits for tasks assessed/performed    Balance     End of Session PT - End of Session Equipment Utilized During Treatment: Gait belt;Left knee immobilizer Activity Tolerance: Patient tolerated treatment well Patient left: in chair;with call bell/phone within reach;with family/visitor present Nurse Communication: Patient requests pain meds   GP     [name],[name] 12/22/2011, 12:40 PM Pager: [phone]

## 2011-12-22 NOTE — Progress Notes (Signed)
CSW met with patient and patients family at bedside. Patient is planning on going to [facility] upon discharge. fl2 completed and faxed to [facility]. Probable discharge on Monday.  [name] Lata C. Reshma Hoey MSW, LCSW [phone]

## 2011-12-22 NOTE — Care Management Note (Signed)
    Page 1 of 1   12/25/2011     1:07:57 PM   CARE MANAGEMENT NOTE 12/25/2011  Patient:  [name], [name]   Account Number:  [id number]  Date Initiated:  12/22/2011  Documentation initiated by:  Lanier Clam  Subjective/Objective Assessment:   ADMITTED W/L KNEE PAIN.OA L KNEE     Action/Plan:   FROM HOME   Anticipated DC Date:  12/25/2011   Anticipated DC Plan:  SKILLED NURSING FACILITY  In-house referral  Clinical Social Worker      DC Planning Services  CM consult      Choice offered to / List presented to:             Status of service:  Completed, signed off Medicare Important Message given?   (If response is "NO", the following Medicare IM given date fields will be blank) Date Medicare IM given:   Date Additional Medicare IM given:    Discharge Disposition:  SKILLED NURSING FACILITY  Per UR Regulation:  Reviewed for med. necessity/level of care/duration of stay  If discussed at Long Length of Stay Meetings, dates discussed:    Comments:  12/22/11 Vivyan Biggers RN,BSN NCM [phone] [facility] DEBBIE BARNES(LIASON  FOLLOWING FOR CPM(WILL DELIVER TO SNF @ D/C)PT/OT-SNF.

## 2011-12-22 NOTE — Progress Notes (Signed)
Physical Therapy Treatment Note  12/22/11 1500  PT Visit Information  Last PT Received On 12/22/11  Assistance Needed +1  PT Time Calculation  PT Start Time 1358  PT Stop Time 1417  PT Time Calculation (min) 19 min  Subjective Data  Subjective "I've been doing much better today."  Precautions  Precautions Knee  Required Braces or Orthoses Knee Immobilizer - Left  Knee Immobilizer - Left Discontinue once straight leg raise with < 10 degree lag  Cognition  Overall Cognitive Status Appears within functional limits for tasks assessed/performed  Total Joint Exercises  Ankle Circles/Pumps AROM;Both;10 reps  Quad Sets AROM;Both;10 reps  Short Arc [employer];Left;10 reps  Heel Slides AAROM;Left;10 reps  Hip ABduction/ADduction AAROM;Left;10 reps  Straight Leg Raises AAROM;Left;10 reps  PT - End of Session  Activity Tolerance Patient tolerated treatment well  Patient left in chair;with call bell/phone within reach;with family/visitor present  PT - Assessment/Plan  Comments on Treatment Session Pt reports up/down to toilet multiple times today but agreeable to perform exercises in recliner.  PT Plan Discharge plan remains appropriate;Frequency remains appropriate  Follow Up Recommendations Skilled nursing facility  Equipment Recommended Defer to next venue  Acute Rehab PT Goals  PT Goal: Perform Home Exercise Program - Progress Progressing toward goal  PT General Charges  $$ ACUTE PT VISIT 1 Procedure  PT Treatments  $Therapeutic Exercise 8-22 mins   Zenovia Jarred, PT Pager: [phone]

## 2011-12-22 NOTE — Progress Notes (Signed)
Subjective: Doing Well. Dressing changed and wound looks fine. Will plan on DC to SNF on Monday. Will continue to monitor her HBg.She has Acute Bloss Anemia.   Objective: Vital signs in last 24 hours: Temp:  [97.4 F (36.3 C)-101 F (38.3 C)] 97.4 F (36.3 C) (07/12 0624) Pulse Rate:  [59-76] 60  (07/12 0624) Resp:  [16-19] 19  (07/12 0624) BP: (90-131)/(57-69) 109/69 mmHg (07/12 0624) SpO2:  [88 %-98 %] 98 % (07/12 0624)  Intake/Output from previous day: 07/11 0701 - 07/12 0700 In: 1580 [P.O.:480; I.V.:1100] Out: 1600 [Urine:1600] Intake/Output this shift:     Basename 12/22/11 0447 12/21/11 0515  HGB 9.3* 11.1*    Basename 12/22/11 0447 12/21/11 0515  WBC 6.4 5.9  RBC 2.80* 3.37*  HCT 26.9* 33.2*  PLT 141* 171    Basename 12/22/11 0447 12/21/11 0515  NA 133* 134*  K 3.7 4.6  CL 99 100  CO2 27 29  BUN 8 13  CREATININE 0.72 0.72  GLUCOSE 119* 116*  CALCIUM 8.4 8.6    Basename 12/22/11 0447 12/21/11 0515  LABPT -- --  INR 1.46 1.12    Dorsiflexion/Plantar flexion intact No cellulitis present  Assessment/Plan: SNF on Monday.   Shanessa Hodak A 12/22/2011, 7:48 AM

## 2011-12-23 LAB — CBC
HCT: 23.3 % — ABNORMAL LOW (ref 36.0–46.0)
Hemoglobin: 8 g/dL — ABNORMAL LOW (ref 12.0–15.0)
MCHC: 34.3 g/dL (ref 30.0–36.0)
WBC: 7.2 10*3/uL (ref 4.0–10.5)

## 2011-12-23 LAB — PROTIME-INR
INR: 1.57 — ABNORMAL HIGH (ref 0.00–1.49)
Prothrombin Time: 19.1 seconds — ABNORMAL HIGH (ref 11.6–15.2)

## 2011-12-23 MED ORDER — WARFARIN SODIUM 5 MG PO TABS
5.0000 mg | ORAL_TABLET | Freq: Once | ORAL | Status: AC
  Administered 2011-12-23: 5 mg via ORAL
  Filled 2011-12-23: qty 1

## 2011-12-23 NOTE — Progress Notes (Signed)
ANTICOAGULATION CONSULT NOTE - Follow Up  Pharmacy Consult for Warfarin Indication: Afib, VTE prophylaxis s/p left TKA  No Known Allergies  Patient Measurements: Height: 5\' 3"  (160 cm) Weight: 194 lb 0.1 oz (88 kg) IBW/kg (Calculated) : 52.4   Vital Signs: Temp: 101.1 F (38.4 C) (07/13 0524) Temp src: Oral (07/13 0524) BP: 116/66 mmHg (07/13 0524) Pulse Rate: 59  (07/13 0524)  Labs:  Basename 12/23/11 0717 12/22/11 0447 12/21/11 0515  HGB 8.0* 9.3* --  HCT 23.3* 26.9* 33.2*  PLT 143* 141* 171  APTT -- -- --  LABPROT 19.1* 18.0* 14.6  INR 1.57* 1.46 1.12  HEPARINUNFRC -- -- --  CREATININE -- 0.72 0.72  CKTOTAL -- -- --  CKMB -- -- --  TROPONINI -- -- --    Estimated Creatinine Clearance: 59 ml/min (by C-G formula based on Cr of 0.72).   Medications:  Scheduled:     . amiodarone  100 mg Oral QPM  . amLODipine  2.5 mg Oral Q breakfast  . atorvastatin  10 mg Oral q1800  . bimatoprost  1 drop Both Eyes QHS  . cyanocobalamin  1,000 mcg Intramuscular Q30 days  . ferrous sulfate  325 mg Oral TID PC  . heparin  5,000 Units Subcutaneous Q12H  . nadolol  40 mg Oral Q breakfast  . warfarin  5 mg Oral ONCE-1800  . Warfarin - Pharmacist Dosing Inpatient   Does not apply q1800   Infusions:     . lactated ringers 10 mL/hr (12/23/11 0732)    Assessment: 82 YOM on chronic warfarin/amiodarone for PAF. Home dose reported as 5mg  daily, last dose taken 7/4 in anticipation of surgery. Warfarin dosing resumed 7/10pm with 1.5x usual home dose. INR progressing towards goal 2-3. No bleeding reported in chart notes. CBC low but stable.  Plan to send to SNF on Monday noted. Also on SQ heparin until INR>2.  Goal of Therapy:  INR 2-3   Plan:  1) Warfarin 5mg  PO x1 at 18:00 2) Daily INR.  Clance Boll, PharmD, BCPS Pager: [phone] 12/23/2011 8:38 AM

## 2011-12-23 NOTE — Progress Notes (Signed)
   Subjective: 3 Days Post-Op Procedure(s) (LRB): TOTAL KNEE ARTHROPLASTY (Left) Patient reports pain as mild.   Patient seen in rounds with Dr. Lequita Halt. Patient is sitting up in bed. Patient is well, and has had no acute complaints or problems Plan is to go Skilled nursing facility after hospital stay.  Objective: Vital signs in last 24 hours: Temp:  [98 F (36.7 C)-102.4 F (39.1 C)] 101.1 F (38.4 C) (07/13 0524) Pulse Rate:  [59-68] 59  (07/13 0524) Resp:  [14-18] 18  (07/13 0524) BP: (99-116)/(64-72) 116/66 mmHg (07/13 0524) SpO2:  [94 %-98 %] 98 % (07/13 0524)  Intake/Output from previous day:  Intake/Output Summary (Last 24 hours) at 12/23/11 0729 Last data filed at 12/23/11 0700  Gross per 24 hour  Intake    120 ml  Output   1100 ml  Net   -980 ml    Intake/Output this shift:    Labs:  Basename 12/22/11 0447 12/21/11 0515  HGB 9.3* 11.1*    Basename 12/22/11 0447 12/21/11 0515  WBC 6.4 5.9  RBC 2.80* 3.37*  HCT 26.9* 33.2*  PLT 141* 171    Basename 12/22/11 0447 12/21/11 0515  NA 133* 134*  K 3.7 4.6  CL 99 100  CO2 27 29  BUN 8 13  CREATININE 0.72 0.72  GLUCOSE 119* 116*  CALCIUM 8.4 8.6    Basename 12/22/11 0447 12/21/11 0515  LABPT -- --  INR 1.46 1.12    EXAM General - Patient is Alert, Appropriate and Oriented Extremity - Neurovascular intact Sensation intact distally Dorsiflexion/Plantar flexion intact Dressing/Incision - clean, dry, scant drainage on dressing Motor Function - intact, moving foot and toes well on exam.   Past Medical History  Diagnosis Date  . Arthritis   . Glaucoma   . Diverticulosis   . GERD (gastroesophageal reflux disease)   . Mixed hyperlipidemia   . Essential hypertension, benign   . Colon polyps   . Atrial fibrillation     Paroxysmal  . Atrial flutter     s/p RFA 2003  . Pneumonia     7/03, with encephalopathy  . Sinus bradycardia     s/p PPM (SJM)  . Varicose veins   . DVT, lower extremity     . IBS (irritable bowel syndrome)   . Osteoporosis   . Cancer     breast cancer 1980's - lumpectomy only  . Varicose veins   . Sleep apnea     STOP BANG SCORE 5    Assessment/Plan: 3 Days Post-Op Procedure(s) (LRB): TOTAL KNEE ARTHROPLASTY (Left) Active Problems:  Essential hypertension, benign  ATRIAL FIBRILLATION, PAROXYSMAL ON WARFARIN  Sick sinus syndrome  Osteoarthritis of left knee   Up with therapy Discharge to SNF first of the week  DVT Prophylaxis - Coumadin Weight-Bearing as tolerated to left leg  PERKINS, ALEXZANDREW 12/23/2011, 7:29 AM

## 2011-12-23 NOTE — Progress Notes (Signed)
Physical Therapy Treatment Patient Details Name: [name] MRN: [id number] DOB: [birth year] Today's Date: 12/23/2011 Time: 4098-1191 PT Time Calculation (min): 26 min  PT Assessment / Plan / Recommendation Comments on Treatment Session  pt appears sleepy, slow moving, but she agreed to walk.  Pt was better able to control posture , stepping and weight bearing through left leg.    Follow Up Recommendations  Skilled nursing facility    Barriers to Discharge        Equipment Recommendations  Defer to next venue    Recommendations for Other Services    Frequency 7X/week   Plan Discharge plan remains appropriate;Frequency remains appropriate    Precautions / Restrictions Precautions Precautions: Knee Precaution Comments: pt able to  SLR today, did not use KI   Pertinent Vitals/Pain Pt with some complaint of left thigh "pulling" after ambulation.  Ice packs applied after treatment    Mobility  Bed Mobility Bed Mobility: Sit to Supine Sit to Supine: 3: Mod assist Scooting to HOB: 3: Mod assist;With trapeze Transfers Transfers: Sit to Stand;Stand to Sit Sit to Stand: 4: Min assist;From chair/3-in-1;From bed;With upper extremity assist Stand to Sit: 4: Min assist;With armrests;With upper extremity assist;To chair/3-in-1 Details for Transfer Assistance: verbal cues for L LE forward and hand placement, assist for weakness Ambulation/Gait Ambulation/Gait Assistance: 4: Min assist Ambulation Distance (Feet): 100 Feet Assistive device: Rolling walker Ambulation/Gait Assistance Details: consistent cueing to stand up straight and extend trunk. Able to bear weight through left leg  Gait Pattern: Step-to pattern;Antalgic Gait velocity: decreased General Gait Details: ambulated without KI.  Pt with good weight bearing throught left leg, no bucking seen Stairs: No Wheelchair Mobility Wheelchair Mobility: No    Exercises Total Joint Exercises Ankle Circles/Pumps: AROM;Both;10  reps Quad Sets: AROM;Left;10 reps Gluteal Sets: AROM;Both;10 reps Short Arc QuadBarbaraann [name];Left;10 reps Hip ABduction/ADduction: AROM;Left;Standing;5 reps Straight Leg Raises: AROM;Left;5 reps;Seated Long Arc Quad: AAROM;Left;5 reps;Seated Knee Flexion: AROM;Left;5 reps;Seated Marching in Standing: AROM;Left;5 reps;Standing   PT Diagnosis:    PT Problem List:   PT Treatment Interventions:     PT Goals Acute Rehab PT Goals PT Goal: Sit to Stand - Progress: Progressing toward goal PT Goal: Ambulate - Progress: Progressing toward goal PT Goal: Perform Home Exercise Program - Progress: Progressing toward goal  Visit Information  Last PT Received On: 12/23/11 Assistance Needed: +1    Subjective Data  Subjective: pt happy that she is doing a little better today Patient Stated Goal: son wants pt to put more weight on legs and not so much weight on walker   Cognition  Overall Cognitive Status: Appears within functional limits for tasks assessed/performed Arousal/Alertness: Awake/alert Orientation Level: Appears intact for tasks assessed Behavior During Session: [facility] for tasks performed    Balance  Balance Balance Assessed: No  End of Session PT - End of Session Equipment Utilized During Treatment: Gait belt Activity Tolerance: Patient tolerated treatment well Patient left: in bed;with call bell/phone within reach;with family/visitor present   GP     [name] 12/23/2011, 1:10 PM

## 2011-12-23 NOTE — Progress Notes (Signed)
Pt's temp of 101.1. Pt is asymptomatic, no complain of chills, fever, cough, lungs clear, or burning with urination.  MD on call was notified. Will continue to monitor.

## 2011-12-24 LAB — CBC
Hemoglobin: 8.1 g/dL — ABNORMAL LOW (ref 12.0–15.0)
MCHC: 34.8 g/dL (ref 30.0–36.0)
RDW: 12.8 % (ref 11.5–15.5)
WBC: 5.3 10*3/uL (ref 4.0–10.5)

## 2011-12-24 LAB — HEMOGLOBIN AND HEMATOCRIT, BLOOD: HCT: 24.7 % — ABNORMAL LOW (ref 36.0–46.0)

## 2011-12-24 LAB — PROTIME-INR
INR: 1.55 — ABNORMAL HIGH (ref 0.00–1.49)
Prothrombin Time: 18.9 seconds — ABNORMAL HIGH (ref 11.6–15.2)

## 2011-12-24 MED ORDER — WARFARIN SODIUM 7.5 MG PO TABS
7.5000 mg | ORAL_TABLET | Freq: Once | ORAL | Status: AC
  Administered 2011-12-24: 7.5 mg via ORAL
  Filled 2011-12-24: qty 1

## 2011-12-24 NOTE — Progress Notes (Signed)
ANTICOAGULATION CONSULT NOTE - Follow Up  Pharmacy Consult for Warfarin Indication: Afib, VTE prophylaxis s/p left TKA  No Known Allergies  Patient Measurements: Height: 5\' 3"  (160 cm) Weight: 194 lb 0.1 oz (88 kg) IBW/kg (Calculated) : 52.4   Vital Signs: Temp: 99.3 F (37.4 C) (07/14 0528) Temp src: Oral (07/14 0528) BP: 120/73 mmHg (07/14 0528) Pulse Rate: 80  (07/14 0528)  Labs:  Basename 12/24/11 0515 12/23/11 0717 12/22/11 0447  HGB 8.1* 8.0* --  HCT 23.3* 23.3* 26.9*  PLT 151 143* 141*  APTT -- -- --  LABPROT 18.9* 19.1* 18.0*  INR 1.55* 1.57* 1.46  HEPARINUNFRC -- -- --  CREATININE -- -- 0.72  CKTOTAL -- -- --  CKMB -- -- --  TROPONINI -- -- --    Estimated Creatinine Clearance: 59 ml/min (by C-G formula based on Cr of 0.72).   Assessment: 82 YOM on chronic warfarin/amiodarone for PAF. Home dose reported as 5mg  daily, last dose taken 7/4 in anticipation of surgery. Warfarin dosing resumed 7/10pm with 1.5x usual home dose. INR does not appear to be responding as anticipated to 5 mg doses so will boost today with 7.5 mg dose. No bleeding reported in chart notes. CBC low but stable.  Plan to send to SNF on Monday noted. Also on SQ heparin until INR>2.  Goal of Therapy:  INR 2-3   Plan:  1) Warfarin 7.5mg  PO x1 at 18:00 2) Daily INR.  Clance Boll, PharmD, BCPS Pager: [phone] 12/24/2011 8:27 AM

## 2011-12-24 NOTE — Discharge Summary (Signed)
Physician Discharge Summary  Patient ID: [name] MRN: [id number] DOB/AGE: 1928-11-15 78 y.o.  Admit date: 12/20/2011 Discharge date: 12/24/2011  Admission Diagnoses:Degerative Arthritis of Left Knee  Discharge Diagnoses: Left Total Knee Active Problems:  Osteoarthritis of left knee  Essential hypertension, benign  ATRIAL FIBRILLATION, PAROXYSMAL ON WARFARIN  Sick sinus syndrome   Discharged Condition: Improved.  Hospital Course: we monitored her Hbg daily and she had Physical Therapy daily.  Consults: Physical Therapy.  Significant Diagnostic Studies: Post-Op Xrays.  Treatments: Physical Therapy.  Discharge Exam: Blood pressure 116/73, pulse 84, temperature 97.4 F (36.3 C), temperature source Oral, resp. rate 18, height 5\' 3"  (1.6 m), weight 88 kg (194 lb 0.1 oz), SpO2 97.00%. Her wound was fine Post-Op and her Hbg stabilized.  Disposition:   Discharge Orders    Future Orders Please Complete By Expires   Diet - low sodium heart healthy      Call MD / Call 911      Comments:   If you experience chest pain or shortness of breath, CALL 911 and be transported to the hospital emergency room.  If you develope a fever above 101 F, pus (white drainage) or increased drainage or redness at the wound, or calf pain, call your surgeon's office.   Constipation Prevention      Comments:   Drink plenty of fluids.  Prune juice may be helpful.  You may use a stool softener, such as Colace (over the counter) 100 mg twice a day.  Use MiraLax (over the counter) for constipation as needed.   Increase activity slowly as tolerated      Discharge instructions      Comments:   Walk with your walker. Weight bearing as instructed. Change your dressing daily. Shower only, no tub bath. Call if any temperatures greater than 101 or any wound complications: [phone] during the day and ask for Dr. Jeannetta Ellis nurse, [facility] [place].   Driving restrictions      Comments:   No driving   Change dressing      Comments:   Change dressing daily with sterile 4 x 4 inch gauze dressing   Do not put a pillow under the knee. Place it under the heel.        Medication List  As of 12/24/2011  8:53 PM   STOP taking these medications         aspirin 81 MG tablet         TAKE these medications         amiodarone 200 MG tablet   Commonly known as: PACERONE   Take 100 mg by mouth every evening.      amLODipine 2.5 MG tablet   Commonly known as: NORVASC   Take 2.5 mg by mouth daily with breakfast.      bimatoprost 0.01 % Soln   Commonly known as: LUMIGAN   Place 1 drop into both eyes at bedtime.      calcium-vitamin D 500-200 MG-UNIT per tablet   Commonly known as: OSCAL WITH D   Take 2 tablets by mouth daily.      cyanocobalamin 1000 MCG/ML injection   Commonly known as: (VITAMIN B-12)   Inject 1,000 mcg into the muscle every 30 (thirty) days.      GLUCOSAMINE CHONDR 1500 COMPLX PO   Take 2 by mouth daily      LIVALO 4 MG Tabs   Generic drug: Pitavastatin Calcium   Take 2 mg by mouth daily.  MELATONIN + L-THEANINE PO   Take 1 tablet by mouth daily.      Melatonin 3 MG Caps   Take 1 capsule by mouth at bedtime.      methocarbamol 500 MG tablet   Commonly known as: ROBAXIN   Take 1 tablet (500 mg total) by mouth every 6 (six) hours as needed.      multivitamin tablet   Take 1 tablet by mouth daily.      nadolol 40 MG tablet   Commonly known as: CORGARD   Take 40 mg by mouth daily with breakfast.      oxyCODONE-acetaminophen 5-325 MG per tablet   Commonly known as: PERCOCET   Take 2 tablets by mouth every 3 (three) hours as needed (Q4-6 hours PRN).      warfarin 5 MG tablet   Commonly known as: COUMADIN   Take 1 tablet (5 mg total) by mouth every evening. Managed by Dr. Lysbeth Galas             Signed: Adar Rase A 12/24/2011, 8:54 PM

## 2011-12-24 NOTE — Progress Notes (Signed)
Physical Therapy Treatment Patient Details Name: [name] MRN: [id number] DOB: Dec 22, 1928 Today's Date: 12/24/2011 Time: 8657-8469 PT Time Calculation (min): 35 min  PT Assessment / Plan / Recommendation Comments on Treatment Session  improving slowly each session.  continues to need PT at SNF prior to return to home    Follow Up Recommendations  Skilled nursing facility    Barriers to Discharge        Equipment Recommendations  Defer to next venue    Recommendations for Other Services    Frequency 7X/week   Plan Discharge plan remains appropriate;Frequency remains appropriate    Precautions / Restrictions Precautions Precautions: Knee Precaution Comments: pt able to  SLR today, did not use KI   Pertinent Vitals/Pain Pt reports "soreness" in left knee with ambulation ice packs applied after treatment    Mobility  Bed Mobility Bed Mobility: Sit to Supine Sit to Supine: 3: Mod assist Scooting to HOB: 3: Mod assist;With trapeze Details for Bed Mobility Assistance: assist to lift legs up onto bed Transfers Transfers: Sit to Stand;Stand to Sit Sit to Stand: From chair/3-in-1;With upper extremity assist;4: Min guard Stand to Sit: With upper extremity assist;4: Min guard;To bed Details for Transfer Assistance: verbal cues for L LE forward and hand placement, assist for weakness Ambulation/Gait Ambulation/Gait Assistance: 4: Min assist Ambulation Distance (Feet): 100 Feet Assistive device: Rolling walker Ambulation/Gait Assistance Details: improved posture and speed today.  continued cues for hip and knee extension in stance phase Gait Pattern: Step-to pattern;Antalgic Gait velocity: decreased General Gait Details: ambulated without KI.  Pt with good weight bearing throught left leg, no bucking seen Stairs: No Wheelchair Mobility Wheelchair Mobility: No    Exercises Total Joint Exercises Ankle Circles/Pumps: AROM;Both;10 reps Quad Sets: AROM;Left;10  reps Gluteal Sets: AROM;Both;10 reps Short Arc Quad: Left;10 reps;AROM Heel Slides: AAROM;Left;10 reps Hip ABduction/ADduction: AROM;Left;Standing;5 reps;Supine;10 reps Straight Leg Raises: AROM;Left;Supine;10 reps Long Arc Quad: AAROM;Left;Seated;10 reps Knee Flexion: AROM;Left;Seated;10 reps Marching in Standing: AROM;Left;5 reps;Standing Standing Hip Extension: AROM;Left;5 reps   PT Diagnosis:    PT Problem List:   PT Treatment Interventions:     PT Goals Acute Rehab PT Goals PT Goal: Supine/Side to Sit - Progress: Progressing toward goal Pt will go Sit to Stand: with supervision PT Goal: Sit to Stand - Progress: Progressing toward goal PT Goal: Ambulate - Progress: Progressing toward goal PT Goal: Perform Home Exercise Program - Progress: Progressing toward goal  Visit Information  Last PT Received On: 12/24/11 Assistance Needed: +1    Subjective Data  Subjective: pt happy that she is doing a little better today Patient Stated Goal: son wants pt to put more weight on legs and not so much weight on walker   Cognition  Overall Cognitive Status: Appears within functional limits for tasks assessed/performed Arousal/Alertness: Awake/alert Orientation Level: Appears intact for tasks assessed Behavior During Session: [facility] for tasks performed    Balance  Balance Balance Assessed: No  End of Session PT - End of Session Equipment Utilized During Treatment: Gait belt Activity Tolerance: Patient tolerated treatment well Patient left: in bed;with call bell/phone within reach;with family/visitor present   GP     Donnetta Hail 12/24/2011, 12:47 PM

## 2011-12-24 NOTE — Progress Notes (Signed)
   Subjective: 4 Days Post-Op Procedure(s) (LRB): TOTAL KNEE ARTHROPLASTY (Left) Patient reports pain as mild.   Patient seen in rounds by Dr. Lequita Halt. Patient is well, and has had no acute complaints or problems Plan is to go Skilled nursing facility after hospital stay.  Objective: Vital signs in last 24 hours: Temp:  [98.2 F (36.8 C)-99.3 F (37.4 C)] 99.3 F (37.4 C) (07/14 0528) Pulse Rate:  [65-80] 80  (07/14 0528) Resp:  [15-20] 18  (07/14 0528) BP: (109-120)/(72-77) 120/73 mmHg (07/14 0528) SpO2:  [94 %-98 %] 94 % (07/14 0528)  Intake/Output from previous day:  Intake/Output Summary (Last 24 hours) at 12/24/11 0916 Last data filed at 12/24/11 0600  Gross per 24 hour  Intake   4218 ml  Output    600 ml  Net   3618 ml    Intake/Output this shift:    Labs:  Basename 12/24/11 0515 12/23/11 0717 12/22/11 0447  HGB 8.1* 8.0* 9.3*    Basename 12/24/11 0515 12/23/11 0717  WBC 5.3 7.2  RBC 2.42* 2.44*  HCT 23.3* 23.3*  PLT 151 143*    Basename 12/22/11 0447  NA 133*  K 3.7  CL 99  CO2 27  BUN 8  CREATININE 0.72  GLUCOSE 119*  CALCIUM 8.4    Basename 12/24/11 0515 12/23/11 0717  LABPT -- --  INR 1.55* 1.57*    EXAM General - Patient is Alert, Appropriate and Oriented Extremity - Neurovascular intact Sensation intact distally Dressing/Incision - clean, dry, no drainage Motor Function - intact, moving foot and toes well on exam.   Past Medical History  Diagnosis Date  . Arthritis   . Glaucoma   . Diverticulosis   . GERD (gastroesophageal reflux disease)   . Mixed hyperlipidemia   . Essential hypertension, benign   . Colon polyps   . Atrial fibrillation     Paroxysmal  . Atrial flutter     s/p RFA 2003  . Pneumonia     7/03, with encephalopathy  . Sinus bradycardia     s/p PPM (SJM)  . Varicose veins   . DVT, lower extremity   . IBS (irritable bowel syndrome)   . Osteoporosis   . Cancer     breast cancer 1980's - lumpectomy only    . Varicose veins   . Sleep apnea     STOP BANG SCORE 5    Assessment/Plan: 4 Days Post-Op Procedure(s) (LRB): TOTAL KNEE ARTHROPLASTY (Left) Active Problems:  Essential hypertension, benign  ATRIAL FIBRILLATION, PAROXYSMAL ON WARFARIN  Sick sinus syndrome  Osteoarthritis of left knee   Up with therapy D/C IV fluids Plan for discharge tomorrow Discharge to SNF  DVT Prophylaxis - Coumadin Weight-Bearing as tolerated to left leg  Kileen Lange 12/24/2011, 9:16 AM

## 2011-12-25 LAB — CBC
Hemoglobin: 8.4 g/dL — ABNORMAL LOW (ref 12.0–15.0)
MCHC: 33.7 g/dL (ref 30.0–36.0)
RDW: 12.8 % (ref 11.5–15.5)
WBC: 4.5 10*3/uL (ref 4.0–10.5)

## 2011-12-25 LAB — BASIC METABOLIC PANEL
Chloride: 103 mEq/L (ref 96–112)
GFR calc Af Amer: >90 mL/min (ref >90)
GFR calc non Af Amer: 81 mL/min — ABNORMAL LOW (ref >90)
Potassium: 4 mEq/L (ref 3.5–5.1)
Sodium: 135 mEq/L (ref 135–145)

## 2011-12-25 LAB — PROTIME-INR
INR: 1.69 — ABNORMAL HIGH (ref 0.00–1.49)
Prothrombin Time: 20.2 seconds — ABNORMAL HIGH (ref 11.6–15.2)

## 2011-12-25 MED ORDER — WARFARIN SODIUM 7.5 MG PO TABS
7.5000 mg | ORAL_TABLET | Freq: Once | ORAL | Status: AC
  Administered 2011-12-25: 7.5 mg via ORAL
  Filled 2011-12-25: qty 1

## 2011-12-25 NOTE — Progress Notes (Signed)
Patient  discharged to [facility], sons at bedside. Pt picked up by transport personal no complaints of any pain or discomfort upon discharge. Report  Given to [employer].

## 2011-12-25 NOTE — Progress Notes (Signed)
OT Note:  Pt has done all ADLs and has no toileting needs at this time.  She may be discharged today.  If she remains, we will check back another day.  [place], OTR/L [phone] 12/25/2011

## 2011-12-25 NOTE — Progress Notes (Signed)
Pt declined PT stating she walked with son earlier and she is waiting for EMS to take her to SNF([facility]) Felecia Shelling  PTA [facility]  Acute  Rehab Pager     [phone]

## 2011-12-25 NOTE — Progress Notes (Signed)
Subjective: Doing very well today. Dressing changed and wound looks fine. Hbg is stable.   Objective: Vital signs in last 24 hours: Temp:  [97.4 F (36.3 C)-99.8 F (37.7 C)] 99.8 F (37.7 C) (07/15 0639) Pulse Rate:  [83-86] 83  (07/15 0639) Resp:  [16-18] 18  (07/15 0639) BP: (107-122)/(71-78) 122/78 mmHg (07/15 0639) SpO2:  [94 %-97 %] 94 % (07/15 0639)  Intake/Output from previous day: 07/14 0701 - 07/15 0700 In: 480 [P.O.:480] Out: 725 [Urine:725] Intake/Output this shift:     Basename 12/25/11 0436 12/24/11 2010 12/24/11 0515 12/23/11 0717  HGB 8.4* 8.5* 8.1* 8.0*    Basename 12/25/11 0436 12/24/11 2010 12/24/11 0515  WBC 4.5 -- 5.3  RBC 2.56* -- 2.42*  HCT 24.9* 24.7* --  PLT 174 -- 151    Basename 12/25/11 0436  NA 135  K 4.0  CL 103  CO2 26  BUN 10  CREATININE 0.67  GLUCOSE 109*  CALCIUM 8.5    Basename 12/25/11 0436 12/24/11 0515  LABPT -- --  INR 1.69* 1.55*    Neurologically intact Dorsiflexion/Plantar flexion intact wound looks fine  Assessment/Plan: DC Today to SNF. Will see her in 1.5 weeks.   Cherylee Rawlinson A 12/25/2011, 7:14 AM

## 2011-12-25 NOTE — Progress Notes (Signed)
ANTICOAGULATION CONSULT NOTE - Follow Up  Pharmacy Consult for Warfarin Indication: Afib, VTE prophylaxis s/p left TKA  No Known Allergies  Patient Measurements: Height: 5\' 3"  (160 cm) Weight: 194 lb 0.1 oz (88 kg) IBW/kg (Calculated) : 52.4   Vital Signs: Temp: 99.8 F (37.7 C) (07/15 0639) Temp src: Oral (07/15 0639) BP: 122/78 mmHg (07/15 0639) Pulse Rate: 83  (07/15 0639)  Labs:  Basename 12/25/11 0436 12/24/11 2010 12/24/11 0515 12/23/11 0717  HGB 8.4* 8.5* -- --  HCT 24.9* 24.7* 23.3* --  PLT 174 -- 151 143*  APTT -- -- -- --  LABPROT 20.2* -- 18.9* 19.1*  INR 1.69* -- 1.55* 1.57*  HEPARINUNFRC -- -- -- --  CREATININE 0.67 -- -- --  CKTOTAL -- -- -- --  CKMB -- -- -- --  TROPONINI -- -- -- --    Estimated Creatinine Clearance: 59 ml/min (by C-G formula based on Cr of 0.67).   Assessment:  82 YOM on chronic warfarin/amiodarone for PAF. Home dose reported as 5mg  daily, last dose taken 7/4 in anticipation of surgery. Warfarin dosing resumed 7/10 s/p L TKA  Patient also on sq heparin 5000 units q12h until INR therapeutic  INR 1.69 today, slowly increasing on higher than home dose  H/H low but stable, no bleeding/complications reported.   Plan SNF today, MD discharging patient out on Coumadin 5 mg po daily.    Goal of Therapy:  INR 2-3   Plan:   Talked with patient, recommended taking 7.5 mg po x 1 tonight (from hospital supply) then will be taking 5 mg po daily as per home and follow IN[facility] checks at [facility].  Patient's inpatient coumadin dosage are as followed   7/10: INR 1.09, Coumadin 7.5 mg x 1 7/11: INR 1.12, Coumadin 5mg  x 1 7/12: INR 1.46, Coumadin 5 mg x 1  7/13: INR 1.57, Coumadin 5 mg x 1 7/14: INR 1.55, Coumadin 7.5 mg x 1 7/15: Recommended 7.5 mg x 1 tonight  Geoffry Paradise, PharmD, BCPS Pager: [phone] 10:36 AM

## 2011-12-25 NOTE — Progress Notes (Signed)
Patient cleared for discharge. Packet copied and placed in [place]. ptar called for transportation. Patient and sons at bedside informed and agreeable.  Kollen Armenti C. Sreekar Broyhill MSW, LCSW [phone]

## 2011-12-25 NOTE — Progress Notes (Signed)
Patient cleared for discharge. Submitted for blue medicare auth.  Jetaime Pinnix C. Ersilia Brawley MSW, LCSW [phone]

## 2012-01-11 ENCOUNTER — Ambulatory Visit: Payer: Medicare Other | Attending: Orthopedic Surgery | Admitting: Physical Therapy

## 2012-01-11 DIAGNOSIS — R5381 Other malaise: Secondary | ICD-10-CM | POA: Insufficient documentation

## 2012-01-11 DIAGNOSIS — M25569 Pain in unspecified knee: Secondary | ICD-10-CM | POA: Insufficient documentation

## 2012-01-11 DIAGNOSIS — IMO0001 Reserved for inherently not codable concepts without codable children: Secondary | ICD-10-CM | POA: Insufficient documentation

## 2012-01-11 DIAGNOSIS — R269 Unspecified abnormalities of gait and mobility: Secondary | ICD-10-CM | POA: Insufficient documentation

## 2012-01-15 ENCOUNTER — Ambulatory Visit: Payer: Medicare Other | Admitting: Physical Therapy

## 2012-01-17 ENCOUNTER — Ambulatory Visit: Payer: Medicare Other | Admitting: Physical Therapy

## 2012-01-19 ENCOUNTER — Ambulatory Visit: Payer: Medicare Other | Admitting: Physical Therapy

## 2012-01-22 ENCOUNTER — Ambulatory Visit: Payer: Medicare Other | Admitting: Physical Therapy

## 2012-01-24 ENCOUNTER — Ambulatory Visit: Payer: Medicare Other | Admitting: Physical Therapy

## 2012-01-25 ENCOUNTER — Ambulatory Visit: Payer: Medicare Other | Admitting: Physical Therapy

## 2012-01-29 ENCOUNTER — Encounter: Payer: Self-pay | Admitting: Internal Medicine

## 2012-01-29 DIAGNOSIS — I495 Sick sinus syndrome: Secondary | ICD-10-CM

## 2012-01-30 ENCOUNTER — Ambulatory Visit: Payer: Medicare Other | Admitting: Physical Therapy

## 2012-02-01 ENCOUNTER — Ambulatory Visit: Payer: Medicare Other | Admitting: Physical Therapy

## 2012-02-05 ENCOUNTER — Ambulatory Visit: Payer: Medicare Other | Admitting: Physical Therapy

## 2012-02-07 ENCOUNTER — Ambulatory Visit: Payer: Medicare Other | Admitting: Physical Therapy

## 2012-02-09 ENCOUNTER — Ambulatory Visit: Payer: Medicare Other | Admitting: *Deleted

## 2012-02-26 ENCOUNTER — Encounter: Payer: Self-pay | Admitting: Internal Medicine

## 2012-02-26 DIAGNOSIS — I495 Sick sinus syndrome: Secondary | ICD-10-CM

## 2012-04-01 ENCOUNTER — Encounter: Payer: Self-pay | Admitting: Internal Medicine

## 2012-04-01 DIAGNOSIS — I495 Sick sinus syndrome: Secondary | ICD-10-CM

## 2012-04-26 ENCOUNTER — Encounter: Payer: Medicare Other | Admitting: Internal Medicine

## 2012-04-29 ENCOUNTER — Encounter: Payer: Self-pay | Admitting: Internal Medicine

## 2012-04-29 DIAGNOSIS — I4892 Unspecified atrial flutter: Secondary | ICD-10-CM

## 2012-05-01 ENCOUNTER — Encounter: Payer: Medicare Other | Admitting: Internal Medicine

## 2012-05-27 DIAGNOSIS — I495 Sick sinus syndrome: Secondary | ICD-10-CM

## 2012-05-30 ENCOUNTER — Telehealth: Payer: Self-pay | Admitting: Internal Medicine

## 2012-05-30 NOTE — Telephone Encounter (Signed)
Explained to patient that PPM battery is nearing ERI and we need to see her in office to determine estimated longevity.  Patient is in agreement.

## 2012-05-30 NOTE — Telephone Encounter (Signed)
Pt scheduled for 12-26 at 230pm, but would like a call with more information as to why she needs appt sooner, pls call [phone]

## 2012-06-06 ENCOUNTER — Encounter: Payer: Self-pay | Admitting: Internal Medicine

## 2012-06-06 ENCOUNTER — Ambulatory Visit (INDEPENDENT_AMBULATORY_CARE_PROVIDER_SITE_OTHER): Payer: Medicare Other | Admitting: *Deleted

## 2012-06-06 DIAGNOSIS — I495 Sick sinus syndrome: Secondary | ICD-10-CM

## 2012-06-06 LAB — PACEMAKER DEVICE OBSERVATION
AL IMPEDENCE PM: 413 Ohm
AL THRESHOLD: 1.25 V
BAMS-0001: 170 {beats}/min
DEVICE MODEL PM: 807890
RV LEAD AMPLITUDE: 13.68 mv
RV LEAD THRESHOLD: 1 V

## 2012-06-06 NOTE — Progress Notes (Signed)
PPM check 

## 2012-06-07 ENCOUNTER — Ambulatory Visit (INDEPENDENT_AMBULATORY_CARE_PROVIDER_SITE_OTHER): Payer: Medicare Other | Admitting: Internal Medicine

## 2012-06-07 ENCOUNTER — Encounter: Payer: Self-pay | Admitting: *Deleted

## 2012-06-07 ENCOUNTER — Encounter: Payer: Self-pay | Admitting: Internal Medicine

## 2012-06-07 VITALS — BP 135/76 | HR 54 | Ht 65.0 in | Wt 189.0 lb

## 2012-06-07 DIAGNOSIS — I495 Sick sinus syndrome: Secondary | ICD-10-CM

## 2012-06-07 DIAGNOSIS — Z7901 Long term (current) use of anticoagulants: Secondary | ICD-10-CM

## 2012-06-07 DIAGNOSIS — I4891 Unspecified atrial fibrillation: Secondary | ICD-10-CM

## 2012-06-07 LAB — BASIC METABOLIC PANEL
BUN: 15 mg/dL (ref 6–23)
Calcium: 9.1 mg/dL (ref 8.4–10.5)
Creatinine, Ser: 0.7 mg/dL (ref 0.4–1.2)
GFR: 82.62 mL/min (ref >60.00)

## 2012-06-07 LAB — CBC WITH DIFFERENTIAL/PLATELET
Eosinophils Relative: 3 % (ref 0.0–5.0)
MCV: 96.5 fl (ref 78.0–100.0)
Monocytes Absolute: 0.5 10*3/uL (ref 0.1–1.0)
Neutrophils Relative %: 57 % (ref 43.0–77.0)
Platelets: 188 10*3/uL (ref 150.0–400.0)
WBC: 4 10*3/uL — ABNORMAL LOW (ref 4.5–10.5)

## 2012-06-07 LAB — PROTIME-INR
INR: 2.4 ratio — ABNORMAL HIGH (ref 0.8–1.0)
Prothrombin Time: 25.3 s — ABNORMAL HIGH (ref 10.2–12.4)

## 2012-06-07 NOTE — Patient Instructions (Signed)
See instruction sheet for generator change  

## 2012-06-07 NOTE — Progress Notes (Signed)
  PCP:  NYLAND,LEONARD ROBERT, MD  The patient presents today for routine electrophysiology followup.  Since last being seen in our clinic, the patient reports doing very well.  She underwent knee surgery in July and is happy to have done this.  Over the past few days, she has noticed decline in exercise tolerance with exertional SOB.  This temporally corresponds to her reaching ERI.  Today, she denies symptoms of palpitations, chest pain, orthopnea, PND, lower extremity edema, dizziness, presyncope, syncope, or neurologic sequela.  The patient feels that she is tolerating medications without difficulties and is otherwise without complaint today.   Past Medical History  Diagnosis Date  . Arthritis   . Glaucoma(365)   . Diverticulosis   . GERD (gastroesophageal reflux disease)   . Mixed hyperlipidemia   . Essential hypertension, benign   . Colon polyps   . Atrial fibrillation     Paroxysmal  . Atrial flutter     s/p RFA 2003  . Pneumonia     7/03, with encephalopathy  . Sinus bradycardia     s/p PPM (SJM)  . Varicose veins   . DVT, lower extremity   . IBS (irritable bowel syndrome)   . Osteoporosis   . Cancer     breast cancer 1980's - lumpectomy only  . Varicose veins   . Sleep apnea     STOP BANG SCORE 5   Past Surgical History  Procedure Date  . Cholecystectomy   . Pacemaker insertion 2003    Dual chamber St. Jude,  . Breast lumpectomy     Right breast   . Colonoscopy w/ biopsies and polypectomy 03/2003, 05/2008, 02/20/2011    severe diverticulosis, tubulovillous adenoma polyp, internal and external hemorrhoids 2012: severe diverticulosis, 4 mm polyp, hemorrhoids  . Cataracts   . Fractured wrist     repair  . Total knee arthroplasty 12/20/2011    Procedure: TOTAL KNEE ARTHROPLASTY;  Surgeon: Ronald A Gioffre, MD;  Location: [facility];  Service: Orthopedics;  Laterality: Left;    Current Outpatient Prescriptions  Medication Sig Dispense Refill  . amiodarone (PACERONE)  200 MG tablet Take 100 mg by mouth every evening.       . amLODipine (NORVASC) 2.5 MG tablet Take 2.5 mg by mouth daily with breakfast.       . bimatoprost (LUMIGAN) 0.01 % SOLN Place 1 drop into both eyes at bedtime.      . calcium-vitamin D (OSCAL WITH D) 500-200 MG-UNIT per tablet Take 2 tablets by mouth daily.        . cyanocobalamin (,VITAMIN B-12,) 1000 MCG/ML injection Inject 1,000 mcg into the muscle every 30 (thirty) days.        . Glucosamine-Chondroit-Vit C-Mn (GLUCOSAMINE CHONDR 1500 COMPLX PO) Take 2 by mouth daily        . Melaton-Thean-Cham-PassF-LBalm (MELATONIN + L-THEANINE PO) Take 1 tablet by mouth daily.      . Melatonin 3 MG CAPS Take 1 capsule by mouth at bedtime.      . Multiple Vitamin (MULTIVITAMIN) tablet Take 1 tablet by mouth daily.        . nadolol (CORGARD) 40 MG tablet Take 40 mg by mouth daily with breakfast.       . pravastatin (PRAVACHOL) 20 MG tablet Take 20 mg by mouth daily.      . warfarin (COUMADIN) 5 MG tablet Take 1 tablet (5 mg total) by mouth every evening. Managed by Dr. Nyland  30 tablet  0      Not on File  History   Social History  . Marital Status: Widowed    Spouse Name: N/A    Number of Children: N/A  . Years of Education: N/A   Occupational History  . Retired    Social History Main Topics  . Smoking status: Never Smoker   . Smokeless tobacco: Never Used  . Alcohol Use: No  . Drug Use: No  . Sexually Active: Not on file   Other Topics Concern  . Not on file   Social History Narrative  . No narrative on file    Family History  Problem Relation Age of Onset  . Colon cancer Brother   . Colon cancer Other     Grandfather  . Cancer Father   . Stroke Mother   . Heart attack      CHF grandmother    ROS-  All systems are reviewed and are negative except as outlined in the HPI above   Physical Exam: Filed Vitals:   06/07/12 1023  BP: 135/76  Pulse: 54  Height: 5' 5" (1.651 m)  Weight: 189 lb (85.73 kg)    GEN- The  patient is well appearing, alert and oriented x 3 today.   Head- normocephalic, atraumatic Eyes-  Sclera clear, conjunctiva pink Ears- hearing intact Oropharynx- clear Neck- supple, no JVP Lymph- no cervical lymphadenopathy Lungs- Clear to ausculation bilaterally, normal work of breathing Chest- R sided pacemaker pocket is well healed Heart- Regular rate and rhythm, no murmurs, rubs or gallops, PMI not laterally displaced GI- soft, NT, ND, + BS Extremities- no clubbing, cyanosis, or edema MS- no significant deformity or atrophy Skin- no rash or lesion Psych- euthymic mood, full affect Neuro- strength and sensation are intact  Pacemaker interrogation- reviewed in detail today,  See PACEART report ekg today reveals atrial pacing at 54 bpm, PR 136 msec, QRS 114 msec, incomplete RBBB  Assessment and Plan:  1, Bradycardia She has reached ERI battery status She has developed decreased exercise tolerance with loss of rate response See Pace Art report Risks, benefits, and alternatives to pacemaker generator replacement were discussed with the patient and her son today.  They understand the risks and wish to proceed at the next available time.  2. afib Hold coumadin 48 hours prior to the procedure  

## 2012-06-17 MED ORDER — SODIUM CHLORIDE 0.9 % IR SOLN
80.0000 mg | Status: AC
  Filled 2012-06-17: qty 2

## 2012-06-17 MED ORDER — CEFAZOLIN SODIUM-DEXTROSE 2-3 GM-% IV SOLR
2.0000 g | INTRAVENOUS | Status: AC
  Filled 2012-06-17 (×2): qty 50

## 2012-06-18 ENCOUNTER — Ambulatory Visit (HOSPITAL_COMMUNITY)
Admission: RE | Admit: 2012-06-18 | Discharge: 2012-06-18 | Disposition: A | Discharge status: Home / Self Care | Payer: Medicare Other | Source: Ambulatory Visit | Attending: Internal Medicine | Admitting: Internal Medicine

## 2012-06-18 ENCOUNTER — Encounter (HOSPITAL_COMMUNITY)
Admission: RE | Disposition: A | Discharge status: Home / Self Care | Payer: Self-pay | Source: Ambulatory Visit | Attending: Internal Medicine

## 2012-06-18 DIAGNOSIS — I495 Sick sinus syndrome: Secondary | ICD-10-CM | POA: Diagnosis present

## 2012-06-18 DIAGNOSIS — I480 Paroxysmal atrial fibrillation: Secondary | ICD-10-CM | POA: Diagnosis present

## 2012-06-18 DIAGNOSIS — I4891 Unspecified atrial fibrillation: Secondary | ICD-10-CM | POA: Insufficient documentation

## 2012-06-18 DIAGNOSIS — Z45018 Encounter for adjustment and management of other part of cardiac pacemaker: Secondary | ICD-10-CM | POA: Insufficient documentation

## 2012-06-18 DIAGNOSIS — I10 Essential (primary) hypertension: Secondary | ICD-10-CM | POA: Insufficient documentation

## 2012-06-18 DIAGNOSIS — Z7901 Long term (current) use of anticoagulants: Secondary | ICD-10-CM

## 2012-06-18 HISTORY — PX: PERMANENT PACEMAKER GENERATOR CHANGE: SHX6022

## 2012-06-18 HISTORY — PX: PACEMAKER GENERATOR CHANGE: SHX5998

## 2012-06-18 LAB — PROTIME-INR
INR: 1.54 — ABNORMAL HIGH (ref 0.00–1.49)
Prothrombin Time: 18 seconds — ABNORMAL HIGH (ref 11.6–15.2)

## 2012-06-18 SURGERY — PERMANENT PACEMAKER GENERATOR CHANGE
Anesthesia: LOCAL

## 2012-06-18 MED ORDER — ACETAMINOPHEN 325 MG PO TABS: 325.0000 mg | ORAL_TABLET | ORAL | Status: DC | PRN

## 2012-06-18 MED ORDER — SODIUM CHLORIDE 0.9 % IJ SOLN: 3.0000 mL | Freq: Two times a day (BID) | INTRAMUSCULAR | Status: DC

## 2012-06-18 MED ORDER — SODIUM CHLORIDE 0.9 % IJ SOLN: 3.0000 mL | INTRAMUSCULAR | Status: DC | PRN

## 2012-06-18 MED ORDER — HYDROCODONE-ACETAMINOPHEN 5-325 MG PO TABS: 1.0000 | ORAL_TABLET | ORAL | Status: DC | PRN

## 2012-06-18 MED ORDER — SODIUM CHLORIDE 0.45 % IV SOLN
INTRAVENOUS | Status: DC
  Administered 2012-06-18: 14:00:00 via INTRAVENOUS

## 2012-06-18 MED ORDER — MUPIROCIN 2 % EX OINT: TOPICAL_OINTMENT | Freq: Two times a day (BID) | CUTANEOUS | Status: DC

## 2012-06-18 MED ORDER — SODIUM CHLORIDE 0.9 % IV SOLN: 250.0000 mL | INTRAVENOUS | Status: DC | PRN

## 2012-06-18 MED ORDER — HEPARIN (PORCINE) IN NACL 2-0.9 UNIT/ML-% IJ SOLN
INTRAMUSCULAR | Status: AC
  Filled 2012-06-18: qty 500

## 2012-06-18 MED ORDER — CHLORHEXIDINE GLUCONATE 4 % EX LIQD: 60.0000 mL | Freq: Once | CUTANEOUS | Status: DC

## 2012-06-18 MED ORDER — ONDANSETRON HCL 4 MG/2ML IJ SOLN: 4.0000 mg | Freq: Four times a day (QID) | INTRAMUSCULAR | Status: DC | PRN

## 2012-06-18 MED ORDER — SODIUM CHLORIDE 0.9 % IV SOLN: 250.0000 mL | INTRAVENOUS | Status: DC

## 2012-06-18 MED ORDER — LIDOCAINE HCL (PF) 1 % IJ SOLN
INTRAMUSCULAR | Status: AC
  Filled 2012-06-18: qty 60

## 2012-06-18 MED ORDER — MUPIROCIN 2 % EX OINT
TOPICAL_OINTMENT | CUTANEOUS | Status: AC
  Administered 2012-06-18: 1 via NASAL
  Filled 2012-06-18: qty 22

## 2012-06-18 NOTE — Interval H&P Note (Signed)
History and Physical Interval Note:  06/18/2012 3:11 PM  [name]  has presented today for surgery, with the diagnosis of sick sinus syndrome  The various methods of treatment have been discussed with the patient and family. After consideration of risks, benefits and other options for treatment, the patient has consented to  Procedure(s) (LRB) with comments: PERMANENT PACEMAKER GENERATOR CHANGE (N/A) as a surgical intervention .  The patient's history has been reviewed, patient examined, no change in status, stable for surgery.  I have reviewed the patient's chart and labs.  Questions were answered to the patient's satisfaction.     Hillis Range

## 2012-06-18 NOTE — H&P (View-Only) (Signed)
PCP:  Josue Hector, MD  The patient presents today for routine electrophysiology followup.  Since last being seen in our clinic, the patient reports doing very well.  She underwent knee surgery in July and is happy to have done this.  Over the past few days, she has noticed decline in exercise tolerance with exertional SOB.  This temporally corresponds to her reaching ERI.  Today, she denies symptoms of palpitations, chest pain, orthopnea, PND, lower extremity edema, dizziness, presyncope, syncope, or neurologic sequela.  The patient feels that she is tolerating medications without difficulties and is otherwise without complaint today.   Past Medical History  Diagnosis Date  . Arthritis   . Glaucoma(365)   . Diverticulosis   . GERD (gastroesophageal reflux disease)   . Mixed hyperlipidemia   . Essential hypertension, benign   . Colon polyps   . Atrial fibrillation     Paroxysmal  . Atrial flutter     s/p RFA 2003  . Pneumonia     7/03, with encephalopathy  . Sinus bradycardia     s/p PPM (SJM)  . Varicose veins   . DVT, lower extremity   . IBS (irritable bowel syndrome)   . Osteoporosis   . Cancer     breast cancer 1980's - lumpectomy only  . Varicose veins   . Sleep apnea     STOP BANG SCORE 5   Past Surgical History  Procedure Date  . Cholecystectomy   . Pacemaker insertion 2003    Dual chamber St. Jude,  . Breast lumpectomy     Right breast   . Colonoscopy w/ biopsies and polypectomy 03/2003, 05/2008, 02/20/2011    severe diverticulosis, tubulovillous adenoma polyp, internal and external hemorrhoids 2012: severe diverticulosis, 4 mm polyp, hemorrhoids  . Cataracts   . Fractured wrist     repair  . Total knee arthroplasty 12/20/2011    Procedure: TOTAL KNEE ARTHROPLASTY;  Surgeon: Jacki Cones, MD;  Location: [facility];  Service: Orthopedics;  Laterality: Left;    Current Outpatient Prescriptions  Medication Sig Dispense Refill  . amiodarone (PACERONE)  200 MG tablet Take 100 mg by mouth every evening.       Marland Kitchen amLODipine (NORVASC) 2.5 MG tablet Take 2.5 mg by mouth daily with breakfast.       . bimatoprost (LUMIGAN) 0.01 % SOLN Place 1 drop into both eyes at bedtime.      . calcium-vitamin D (OSCAL WITH D) 500-200 MG-UNIT per tablet Take 2 tablets by mouth daily.        . cyanocobalamin (,VITAMIN B-12,) 1000 MCG/ML injection Inject 1,000 mcg into the muscle every 30 (thirty) days.        . Glucosamine-Chondroit-Vit C-Mn (GLUCOSAMINE CHONDR 1500 COMPLX PO) Take 2 by mouth daily        . Melaton-Thean-Cham-PassF-LBalm (MELATONIN + L-THEANINE PO) Take 1 tablet by mouth daily.      . Melatonin 3 MG CAPS Take 1 capsule by mouth at bedtime.      . Multiple Vitamin (MULTIVITAMIN) tablet Take 1 tablet by mouth daily.        . nadolol (CORGARD) 40 MG tablet Take 40 mg by mouth daily with breakfast.       . pravastatin (PRAVACHOL) 20 MG tablet Take 20 mg by mouth daily.      Marland Kitchen warfarin (COUMADIN) 5 MG tablet Take 1 tablet (5 mg total) by mouth every evening. Managed by Dr. Lysbeth Galas  30 tablet  0  Not on File  History   Social History  . Marital Status: Widowed    Spouse Name: N/A    Number of Children: N/A  . Years of Education: N/A   Occupational History  . Retired    Social History Main Topics  . Smoking status: Never Smoker   . Smokeless tobacco: Never Used  . Alcohol Use: No  . Drug Use: No  . Sexually Active: Not on file   Other Topics Concern  . Not on file   Social History Narrative  . No narrative on file    Family History  Problem Relation Age of Onset  . Colon cancer Brother   . Colon cancer Other     Grandfather  . Cancer Father   . Stroke Mother   . Heart attack      CHF grandmother    ROS-  All systems are reviewed and are negative except as outlined in the HPI above   Physical Exam: Filed Vitals:   06/07/12 1023  BP: 135/76  Pulse: 54  Height: 5\' 5"  (1.651 m)  Weight: 189 lb (85.73 kg)    GEN- The  patient is well appearing, alert and oriented x 3 today.   Head- normocephalic, atraumatic Eyes-  Sclera clear, conjunctiva pink Ears- hearing intact Oropharynx- clear Neck- supple, no JVP Lymph- no cervical lymphadenopathy Lungs- Clear to ausculation bilaterally, normal work of breathing Chest- R sided pacemaker pocket is well healed Heart- Regular rate and rhythm, no murmurs, rubs or gallops, PMI not laterally displaced GI- soft, NT, ND, + BS Extremities- no clubbing, cyanosis, or edema MS- no significant deformity or atrophy Skin- no rash or lesion Psych- euthymic mood, full affect Neuro- strength and sensation are intact  Pacemaker interrogation- reviewed in detail today,  See PACEART report ekg today reveals atrial pacing at 54 bpm, PR 136 msec, QRS 114 msec, incomplete RBBB  Assessment and Plan:  1, Bradycardia She has reached ERI battery status She has developed decreased exercise tolerance with loss of rate response See Pace Art report Risks, benefits, and alternatives to pacemaker generator replacement were discussed with the patient and her son today.  They understand the risks and wish to proceed at the next available time.  2. afib Hold coumadin 48 hours prior to the procedure

## 2012-06-18 NOTE — Op Note (Signed)
SURGEON:  Hillis Range, MD     PREPROCEDURE DIAGNOSES:   1. Sick sinus syndrome.   2. Paroxsymal Atrial fibrillation.     POSTPROCEDURE DIAGNOSES:   1. Sick sinus syndrome.   2. Paroxsymal Atrial fibrillation.     PROCEDURES:   1. Pacemaker pulse generator replacement.   2. Skin pocket revision.     INTRODUCTION:  [name] is a 79 y.o. female with a history of sick sinus syndrome. She has done well since his pacemaker was implanted.  She has recently reached ERI battery status.  She presents today for pacemaker pulse generator replacement.       DESCRIPTION OF THE PROCEDURE:  Informed written consent was obtained, and the patient was brought to the electrophysiology lab in the fasting state.  The patient's pacemaker was interrogated today and found to be at elective replacement indicator battery status.  The patient required no sedation from for the procedure today.  The patient's right chest was prepped and draped in the usual sterile fashion by the EP lab staff.  The skin overlying the existing pacemaker was infiltrated with lidocaine for local analgesia.  A 4-cm incision was made over the pacemaker pocket.  Using a combination of sharp and blunt dissection, the pacemaker was exposed and removed from the body.  The device was disconnected from the leads. A single silk suture was identified and removed which had secured the device to the pectoralis fascia.  There was no foreign matter or debris within the pocket.  The atrial lead was confirmed to be a SJM model 1488TC-46 (serial number [id number]) lead implanted on 04/08/2002.  The right ventricular lead was confirmed to be a SJM model 1488TC-52  (serial number [id number]) lead implanted on the same date as the atrial lead (above).  Both leads were examined and their integrity was confirmed to be intact.  Atrial lead P-waves were absent at 30 bpm.  The atrial lead impedance measured 390 ohms and a threshold of 1.0 V at 0.5 msec.  Right  ventricular lead R-waves measured 11 mV with impedance of 480 ohms and a threshold of 0.7 V at 0.5 msec.  Both leads were connected to a [employer] DR RF model [id number] (serial number [id number]) pacemaker.  The pocket was revised to accommodate this new device.  Electrocautery was required to assure hemostasis.  The pocket was irrigated with copious gentamicin solution. The pacemaker was then placed into the pocket.  The pocket was then closed in 2 layers with 2-0 Vicryl suture over the subcutaneous and subcuticular layers.  Steri-Strips and a sterile dressing were then applied.  There were no early apparent complications.     CONCLUSIONS:   1. Successful pacemaker pulse generator replacement for elective replacement indicator battery status   2. No early apparent complications.     Hillis Range, MD 06/18/2012 6:09 PM

## 2012-06-21 ENCOUNTER — Telehealth: Payer: Self-pay | Admitting: Internal Medicine

## 2012-06-21 NOTE — Telephone Encounter (Signed)
New problem:   Please advise when should she come back into the office an see Dr. Johney Frame .

## 2012-06-21 NOTE — Telephone Encounter (Signed)
Will forward to Device Clinic to schedule wound check and first post-op appt.

## 2012-06-21 NOTE — Telephone Encounter (Signed)
Melissa, Please call this patient and schedule her for a wound check with the device clinic.  Thanks, Gunnar Fusi

## 2012-06-24 ENCOUNTER — Encounter: Payer: Medicare Other | Admitting: Internal Medicine

## 2012-06-26 ENCOUNTER — Encounter: Payer: Medicare Other | Admitting: Internal Medicine

## 2012-06-26 ENCOUNTER — Ambulatory Visit (INDEPENDENT_AMBULATORY_CARE_PROVIDER_SITE_OTHER): Payer: Medicare Other | Admitting: *Deleted

## 2012-06-26 ENCOUNTER — Encounter: Payer: Self-pay | Admitting: Internal Medicine

## 2012-06-26 DIAGNOSIS — I495 Sick sinus syndrome: Secondary | ICD-10-CM

## 2012-06-26 DIAGNOSIS — I4891 Unspecified atrial fibrillation: Secondary | ICD-10-CM

## 2012-06-26 LAB — PACEMAKER DEVICE OBSERVATION
AL IMPEDENCE PM: 362.5 Ohm
AL THRESHOLD: 1 V
BATTERY VOLTAGE: 3.0381 V
RV LEAD AMPLITUDE: 11.7 mv
RV LEAD IMPEDENCE PM: 600 Ohm
VENTRICULAR PACING PM: 4.5

## 2012-06-26 NOTE — Progress Notes (Signed)
PPM check 

## 2012-06-26 NOTE — Progress Notes (Signed)
Wound check-PPM 

## 2012-06-26 NOTE — Patient Instructions (Addendum)
Follow up with Dr.Allred in February.

## 2012-07-24 ENCOUNTER — Ambulatory Visit (INDEPENDENT_AMBULATORY_CARE_PROVIDER_SITE_OTHER): Payer: Medicare Other | Admitting: Internal Medicine

## 2012-07-24 ENCOUNTER — Encounter: Payer: Self-pay | Admitting: Internal Medicine

## 2012-07-24 VITALS — BP 140/92 | HR 72 | Ht 66.0 in | Wt 190.1 lb

## 2012-07-24 DIAGNOSIS — I495 Sick sinus syndrome: Secondary | ICD-10-CM

## 2012-07-24 DIAGNOSIS — R0602 Shortness of breath: Secondary | ICD-10-CM

## 2012-07-24 DIAGNOSIS — R079 Chest pain, unspecified: Secondary | ICD-10-CM

## 2012-07-24 DIAGNOSIS — I4891 Unspecified atrial fibrillation: Secondary | ICD-10-CM

## 2012-07-24 LAB — PACEMAKER DEVICE OBSERVATION
AL IMPEDENCE PM: 350 Ohm
AL THRESHOLD: 1 V
ATRIAL PACING PM: 99
BAMS-0003: 70 {beats}/min
BATTERY VOLTAGE: 3.023 V

## 2012-07-24 MED ORDER — AMIODARONE HCL 200 MG PO TABS: 100.0000 mg | ORAL_TABLET | Freq: Every evening | ORAL | Status: DC

## 2012-07-24 MED ORDER — NITROGLYCERIN 0.4 MG SL SUBL: 0.4000 mg | SUBLINGUAL_TABLET | SUBLINGUAL | Status: DC | PRN

## 2012-07-24 MED ORDER — NADOLOL 40 MG PO TABS: 40.0000 mg | ORAL_TABLET | Freq: Every day | ORAL | Status: DC

## 2012-07-24 MED ORDER — PRAVASTATIN SODIUM 20 MG PO TABS: 20.0000 mg | ORAL_TABLET | Freq: Every day | ORAL | Status: DC

## 2012-07-24 MED ORDER — AMLODIPINE BESYLATE 2.5 MG PO TABS: 2.5000 mg | ORAL_TABLET | Freq: Every day | ORAL | Status: DC

## 2012-07-24 NOTE — Patient Instructions (Addendum)
Your physician recommends that you schedule a follow-up appointment in: 6 weeks with Dr Diona Browner and 12 months with Dr Johney Frame in [facility]  Your physician has requested that you have a lexiscan myoview. For further information please visit [email or web address] Please follow instruction sheet, as given.  Your physician has recommended you make the following change in your medication:  1) Ntg 0.4mg  as directed

## 2012-07-25 ENCOUNTER — Encounter: Payer: Self-pay | Admitting: Internal Medicine

## 2012-07-25 DIAGNOSIS — R0602 Shortness of breath: Secondary | ICD-10-CM | POA: Insufficient documentation

## 2012-07-25 DIAGNOSIS — R079 Chest pain, unspecified: Secondary | ICD-10-CM | POA: Insufficient documentation

## 2012-07-25 NOTE — Progress Notes (Signed)
PCP:  Josue Hector, MD Primary Cardiologist: Dr Diona Browner  The patient presents today for electrophysiology followup.  Since having her pacemaker generator change, the patient reports doing reasonably well.  She has begun having episodes of substernal chest pain.  She reports that episodes usually last about 5 minutes.  She also reports exertional SOB.  Today, she denies symptoms of palpitations, orthopnea, PND, lower extremity edema, dizziness, presyncope, syncope, or neurologic sequela.  The patient feels that she is tolerating medications without difficulties and is otherwise without complaint today.   Past Medical History  Diagnosis Date  . Arthritis   . Glaucoma(365)   . Diverticulosis   . GERD (gastroesophageal reflux disease)   . Mixed hyperlipidemia   . Essential hypertension, benign   . Colon polyps   . Atrial fibrillation     Paroxysmal  . Atrial flutter     s/p RFA 2003  . Pneumonia     7/03, with encephalopathy  . Sinus bradycardia     s/p PPM (SJM)  . Varicose veins   . DVT, lower extremity   . IBS (irritable bowel syndrome)   . Osteoporosis   . Cancer     breast cancer 1980's - lumpectomy only  . Varicose veins   . Sleep apnea     STOP BANG SCORE 5   Past Surgical History  Procedure Laterality Date  . Cholecystectomy    . Pacemaker insertion  2003  . Breast lumpectomy      Right breast   . Colonoscopy w/ biopsies and polypectomy  03/2003, 05/2008, 02/20/2011    severe diverticulosis, tubulovillous adenoma polyp, internal and external hemorrhoids 2012: severe diverticulosis, 4 mm polyp, hemorrhoids  . Cataracts    . Fractured wrist      repair  . Total knee arthroplasty  12/20/2011    Procedure: TOTAL KNEE ARTHROPLASTY;  Surgeon: Jacki Cones, MD;  Location: [facility];  Service: Orthopedics;  Laterality: Left;  . Pacemaker generator change  06/18/12    SJM Accent DR RF, Dr Johney Frame    Current Outpatient Prescriptions  Medication Sig Dispense  Refill  . amiodarone (PACERONE) 200 MG tablet Take 0.5 tablets (100 mg total) by mouth every evening.  30 tablet  6  . amLODipine (NORVASC) 2.5 MG tablet Take 1 tablet (2.5 mg total) by mouth daily with breakfast.  30 tablet  11  . bimatoprost (LUMIGAN) 0.01 % SOLN Place 1 drop into both eyes at bedtime.      . calcium-vitamin D (OSCAL WITH D) 500-200 MG-UNIT per tablet Take 2 tablets by mouth daily.        . cyanocobalamin (,VITAMIN B-12,) 1000 MCG/ML injection Inject 1,000 mcg into the muscle every 30 (thirty) days.        . Glucosamine-Chondroit-Vit C-Mn (GLUCOSAMINE CHONDR 1500 COMPLX PO) Take 2 by mouth daily        . Multiple Vitamin (MULTIVITAMIN) tablet Take 1 tablet by mouth daily.        . nadolol (CORGARD) 40 MG tablet Take 1 tablet (40 mg total) by mouth daily with breakfast.  30 tablet  11  . pravastatin (PRAVACHOL) 20 MG tablet Take 1 tablet (20 mg total) by mouth daily.  30 tablet  11  . warfarin (COUMADIN) 5 MG tablet Take 1 tablet (5 mg total) by mouth every evening. Managed by Dr. Lysbeth Galas  30 tablet  0  . nitroGLYCERIN (NITROSTAT) 0.4 MG SL tablet Place 1 tablet (0.4 mg total) under the tongue  every 5 (five) minutes as needed for chest pain.  90 tablet  3   No current facility-administered medications for this visit.    No Known Allergies  History   Social History  . Marital Status: Widowed    Spouse Name: N/A    Number of Children: N/A  . Years of Education: N/A   Occupational History  . Retired    Social History Main Topics  . Smoking status: Never Smoker   . Smokeless tobacco: Never Used  . Alcohol Use: No  . Drug Use: No  . Sexually Active: Not on file   Other Topics Concern  . Not on file   Social History Narrative  . No narrative on file    Family History  Problem Relation Age of Onset  . Colon cancer Brother   . Colon cancer Other     Grandfather  . Cancer Father   . Stroke Mother   . Heart attack      CHF grandmother    ROS-  All systems  are reviewed and are negative except as outlined in the HPI above   Physical Exam: Filed Vitals:   07/24/12 1144  BP: 140/92  Pulse: 72  Height: 5\' 6"  (1.676 m)  Weight: 190 lb 1.9 oz (86.238 kg)    GEN- The patient is elderly appearing, alert and oriented x 3 today.   Head- normocephalic, atraumatic Eyes-  Sclera clear, conjunctiva pink Ears- hearing intact Oropharynx- clear Neck- supple, Lungs- Clear to ausculation bilaterally, normal work of breathing Chest- pacemaker pocket is well healed Heart- Regular rate and rhythm, no murmurs, rubs or gallops, PMI not laterally displaced GI- soft, NT, ND, + BS Extremities- no clubbing, cyanosis, or edema MS- no significant deformity or atrophy Skin- no rash or lesion Psych- euthymic mood, full affect Neuro- strength and sensation are intact  Pacemaker interrogation- reviewed in detail today,  See PACEART report  Assessment and Plan:  1, Bradycardia Normal pacemaker function See Pace Art report No changes today  2. Chest pain/ SOB Pt with new symptoms worrisome for angina. Will obtain lexiscan myoview.  If low risk would manage medically.  If high risk then we will consider cath. Sl NTG prn  3. AF Maintaining sinus rhythm Continue coumadin  Return to see Dr Diona Browner in 6 weeks.  I will see in 9 months for device care.

## 2012-08-01 ENCOUNTER — Ambulatory Visit (HOSPITAL_COMMUNITY): Payer: Medicare Other | Attending: Cardiology | Admitting: Radiology

## 2012-08-01 VITALS — BP 129/79 | HR 62 | Ht 65.0 in | Wt 183.0 lb

## 2012-08-01 DIAGNOSIS — I10 Essential (primary) hypertension: Secondary | ICD-10-CM | POA: Insufficient documentation

## 2012-08-01 DIAGNOSIS — R0989 Other specified symptoms and signs involving the circulatory and respiratory systems: Secondary | ICD-10-CM | POA: Insufficient documentation

## 2012-08-01 DIAGNOSIS — E785 Hyperlipidemia, unspecified: Secondary | ICD-10-CM | POA: Insufficient documentation

## 2012-08-01 DIAGNOSIS — R0602 Shortness of breath: Secondary | ICD-10-CM | POA: Insufficient documentation

## 2012-08-01 DIAGNOSIS — R079 Chest pain, unspecified: Secondary | ICD-10-CM

## 2012-08-01 DIAGNOSIS — R0789 Other chest pain: Secondary | ICD-10-CM | POA: Insufficient documentation

## 2012-08-01 DIAGNOSIS — R0609 Other forms of dyspnea: Secondary | ICD-10-CM | POA: Insufficient documentation

## 2012-08-01 MED ORDER — TECHNETIUM TC 99M SESTAMIBI GENERIC - CARDIOLITE
33.0000 | Freq: Once | INTRAVENOUS | Status: AC | PRN
  Administered 2012-08-01: 33 via INTRAVENOUS

## 2012-08-01 MED ORDER — AMINOPHYLLINE 25 MG/ML IV SOLN
75.0000 mg | Freq: Once | INTRAVENOUS | Status: AC
Start: 2012-08-01 — End: 2012-08-01
  Administered 2012-08-01: 75 mg via INTRAVENOUS

## 2012-08-01 MED ORDER — TECHNETIUM TC 99M SESTAMIBI GENERIC - CARDIOLITE
11.0000 | Freq: Once | INTRAVENOUS | Status: AC | PRN
  Administered 2012-08-01: 11 via INTRAVENOUS

## 2012-08-01 MED ORDER — REGADENOSON 0.4 MG/5ML IV SOLN
0.4000 mg | Freq: Once | INTRAVENOUS | Status: AC
  Administered 2012-08-01: 0.4 mg via INTRAVENOUS

## 2012-08-01 NOTE — Progress Notes (Signed)
[facility] SITE 3 NUCLEAR MED [address] [place], [name] [address] [phone]    Cardiology Nuclear Med Study  [name] is a 79 y.o. female     MRN : [id number]     DOB: 1929-02-01  Procedure Date: 08/01/2012  Nuclear Med Background Indication for Stress Test:  Evaluation for Ischemia History:  h/o PAF; '99 Cath:normal coronaries; '03 Ablation for atrial flutter; '03 PTVP; '10 MPS:no ischemia; '10 Echo:"diastolic dysfunction" Cardiac Risk Factors: Family History - CAD, Hypertension and Lipids  Symptoms:  Chest Pressure.  (last episode of chest discomfort was during a stressful ride over here this am, none now), DOE and SOB   Nuclear Pre-Procedure Caffeine/Decaff Intake:  None NPO After: 8:00pm   Lungs:  Clear. O2 Sat: 98% on room air. IV 0.9% NS with Angio Cath:  22g  IV Site: L Antecubital  IV Started by:  Stanton Kidney, EMT-P  Chest Size (in):  44 Cup Size: C  Height: 5\' 5"  (1.651 m)  Weight:  183 lb (83.008 kg)  BMI:  Body mass index is 30.45 kg/(m^2). Tech Comments:  Med's today at 7am, per patient.    Nuclear Med Study 1 or 2 day study: 1 day  Stress Test Type:  Eugenie Birks  Reading MD: Marca Ancona, MD  Order Authorizing Provider:  Hillis Range, MD  Resting Radionuclide: Technetium 48m Sestamibi  Resting Radionuclide Dose: 11.0 mCi   Stress Radionuclide:  Technetium 35m Sestamibi  Stress Radionuclide Dose: 33.0 mCi           Stress Protocol Rest HR: 62 Stress HR: 61  Rest BP: 129/79 Stress BP: 136/81  Exercise Time (min): n/a METS: n/a   Predicted Max HR: 139 bpm % Max HR: 43.88 bpm Rate Pressure Product: 8296   Dose of Adenosine (mg):  n/a Dose of Lexiscan: 0.4 mg  Dose of Atropine (mg): n/a Dose of Dobutamine: n/a mcg/kg/min (at max HR)  Stress Test Technologist: Smiley Houseman, CMA-N  Nuclear Technologist:  Domenic Polite, CNMT     Rest Procedure:  Myocardial perfusion imaging was performed at rest 45 minutes following the  intravenous administration of Technetium 44m Sestamibi.  Rest ECG: NSR with anterior T wave inversions  Stress Procedure:  The patient received IV Lexiscan 0.4 mg over 15-seconds.  Technetium 97m Sestamibi injected at 30-seconds.  She c/o chest, stomach and head pressure with Lexiscan; that was relieved with Aminophylline 75 mg IV. Quantitative spect images were obtained after a 45 minute delay.  Stress ECG: No significant change from baseline ECG  QPS Raw Data Images:  Normal; no motion artifact; normal heart/lung ratio. Stress Images:  Normal homogeneous uptake in all areas of the myocardium. Rest Images:  Normal homogeneous uptake in all areas of the myocardium. Subtraction (SDS):  There is no evidence of scar or ischemia. Transient Ischemic Dilatation (Normal <1.22):  1.03 Lung/Heart Ratio (Normal <0.45):  0.31  Quantitative Gated Spect Images QGS EDV:  96 ml QGS ESV:  36 ml  Impression Exercise Capacity:  Lexiscan with no exercise. BP Response:  Normal blood pressure response. Clinical Symptoms:  Chest pressure, abdominal cramping.  ECG Impression:  No changes from baseline.  Comparison with Prior Nuclear Study: No significant change from previous study  Overall Impression:  Normal stress nuclear study.  LV Ejection Fraction: 63%.  LV Wall Motion:  NL LV Function; NL Wall Motion  Marca Ancona 08/01/2012

## 2012-08-09 ENCOUNTER — Telehealth: Payer: Self-pay | Admitting: Internal Medicine

## 2012-08-09 NOTE — Telephone Encounter (Signed)
New problem:  Test results.  

## 2012-08-09 NOTE — Telephone Encounter (Signed)
Patient aware of test results.

## 2012-09-12 ENCOUNTER — Encounter: Payer: Self-pay | Admitting: Cardiology

## 2012-09-12 ENCOUNTER — Ambulatory Visit (INDEPENDENT_AMBULATORY_CARE_PROVIDER_SITE_OTHER): Payer: Medicare Other | Admitting: Cardiology

## 2012-09-12 VITALS — BP 99/66 | HR 63 | Ht 66.0 in | Wt 186.0 lb

## 2012-09-12 DIAGNOSIS — I10 Essential (primary) hypertension: Secondary | ICD-10-CM

## 2012-09-12 DIAGNOSIS — I4891 Unspecified atrial fibrillation: Secondary | ICD-10-CM

## 2012-09-12 DIAGNOSIS — R0602 Shortness of breath: Secondary | ICD-10-CM

## 2012-09-12 MED ORDER — APIXABAN 5 MG PO TABS: 5.0000 mg | ORAL_TABLET | Freq: Two times a day (BID) | ORAL | Status: DC

## 2012-09-12 NOTE — Assessment & Plan Note (Signed)
Recent Myoview was negative for ischemia with normal LVEF.

## 2012-09-12 NOTE — Assessment & Plan Note (Signed)
For now stop Norvasc, continue to check blood pressure at home. Can decide whether further treatment is needed.

## 2012-09-12 NOTE — Patient Instructions (Addendum)
Your physician recommends that you schedule a follow-up appointment in: 3 months. Your physician has recommended you make the following change in your medication: Stop amlodipine 2.5 mg. Please check with your pharmacy regarding the cost of eliquis. If this if affordable to you, after you've taken all of your coumadin tablets, in 2 days of last dose please have your coumadin level (INR ) checked either at Dr. Joyce Copa office or our office. Please call ahead to have this arranged. If your INR is 2.0 or below, you may start the eliquis 5 mg twice daily. Your new prescription has been sent to your pharmacy today. All other medications will remain the same.  Please monitor your blood pressures at home. Please call our office if you notice your blood pressures getting too high.

## 2012-09-12 NOTE — Progress Notes (Signed)
Clinical Summary [name] is an 79 y.o.female last seen in June 2013. She had a more recent visit with Dr. Johney Frame in February. At that time she had reported some symptoms of chest pain and shortness of breath, and Dr. Johney Frame ordered a Lexiscan Myoview. This study was performed on 2/20, was negative for ischemia with LVEF 63%.  We reviewed the results of her stress testing today. She states that she has not had any chest pain recently, has felt somewhat weak. Blood pressure is noted to be in the 90s systolic. She is on low-dose Norvasc.  ECG today shows an atrial paced rhythm.  Today she asked me about Eliquis instead of Coumadin, it had been recommended by her primary care office. Creatinine was normal in December. Weight over 60 kg.   No Known Allergies  Current Outpatient Prescriptions  Medication Sig Dispense Refill  . amiodarone (PACERONE) 200 MG tablet Take 0.5 tablets (100 mg total) by mouth every evening.  30 tablet  6  . bimatoprost (LUMIGAN) 0.01 % SOLN Place 1 drop into both eyes at bedtime.      . calcium-vitamin D (OSCAL WITH D) 500-200 MG-UNIT per tablet Take 2 tablets by mouth daily.        . cyanocobalamin (,VITAMIN B-12,) 1000 MCG/ML injection Inject 1,000 mcg into the muscle every 30 (thirty) days.        . Glucosamine-Chondroit-Vit C-Mn (GLUCOSAMINE CHONDR 1500 COMPLX PO) Take 2 by mouth daily        . Melatonin 3 MG TABS Take 1 tablet by mouth at bedtime.      . Multiple Vitamin (MULTIVITAMIN) tablet Take 1 tablet by mouth daily.        . nadolol (CORGARD) 40 MG tablet Take 1 tablet (40 mg total) by mouth daily with breakfast.  30 tablet  11  . nitroGLYCERIN (NITROSTAT) 0.4 MG SL tablet Place 1 tablet (0.4 mg total) under the tongue every 5 (five) minutes as needed for chest pain.  90 tablet  3  . pravastatin (PRAVACHOL) 20 MG tablet Take 1 tablet (20 mg total) by mouth daily.  30 tablet  11  . warfarin (COUMADIN) 5 MG tablet Take 1 tablet (5 mg total) by mouth  every evening. Managed by Dr. Lysbeth Galas  30 tablet  0  . apixaban (ELIQUIS) 5 MG TABS tablet Take 1 tablet (5 mg total) by mouth 2 (two) times daily.  60 tablet  3   No current facility-administered medications for this visit.    Past Medical History  Diagnosis Date  . Arthritis   . Glaucoma(365)   . Diverticulosis   . GERD (gastroesophageal reflux disease)   . Mixed hyperlipidemia   . Essential hypertension, benign   . Colon polyps   . Atrial fibrillation     Paroxysmal  . Atrial flutter     s/p RFA 2003  . Pneumonia     7/03, with encephalopathy  . Sick sinus syndrome     s/p PPM (SJM)  . Varicose veins   . DVT, lower extremity   . IBS (irritable bowel syndrome)   . Osteoporosis   . Breast cancer     1980's - lumpectomy only  . Sleep apnea     STOP BANG SCORE 5    Past Surgical History  Procedure Laterality Date  . Cholecystectomy    . Pacemaker insertion  2003  . Breast lumpectomy      Right breast   . Colonoscopy w/ biopsies and  polypectomy  03/2003, 05/2008, 02/20/2011    severe diverticulosis, tubulovillous adenoma polyp, internal and external hemorrhoids 2012: severe diverticulosis, 4 mm polyp, hemorrhoids  . Cataracts    . Fractured wrist      repair  . Total knee arthroplasty  12/20/2011    Procedure: TOTAL KNEE ARTHROPLASTY;  Surgeon: Jacki Cones, MD;  Location: [facility] ORS;  Service: Orthopedics;  Laterality: Left;  . Pacemaker generator change  06/18/12    SJM [facility] RF, Dr Johney Frame    Social History [name] reports that she has never smoked. She has never used smokeless tobacco. [name] reports that she does not drink alcohol.  Review of Systems Negative except as outlined.  Physical Examination Filed Vitals:   09/12/12 1351  BP: 99/66  Pulse:    Filed Weights   09/12/12 1341  Weight: 186 lb (84.369 kg)    No acute distress.  HEENT: Conjunctiva and lids normal, oropharynx clear.  Neck: Supple, no carotid bruits, no elevated  jugular venous pressure.  Lungs: Clear to auscultation, nonlabored.  Cardiac: Regular rate and rhythm, no significant systolic murmur or S3..  Abdomen: Soft, bowel sounds present.  Extremities: No pitting. Verrucous veins noted.  Musculoskeletal: No kyphosis.  Skin: Warm and dry.  Neuropsychiatric: Alert and oriented x3, affect appropriate.   Problem List and Plan   Paroxysmal atrial fibrillation Patient would like to change to Eliquis instead of Coumadin. She should be able to take the 5 mg twice daily dose. She does have a voucher for 30 day supply. We will give her a prescription to take to the pharmacy and see how much the subsequent monthly supplies would cost. If she is willing to proceed, she will continue her current Coumadin dose until completed, then 2 days later present to have an INR checked. If less than 2, she will start Eliquis. Otherwise continue her current regimen. I will see her back in 3 months.  Shortness of breath Recent Myoview was negative for ischemia with normal LVEF.  Essential hypertension, benign For now stop Norvasc, continue to check blood pressure at home. Can decide whether further treatment is needed.    Jonelle Sidle, M.D., F.A.C.C.

## 2012-09-12 NOTE — Assessment & Plan Note (Signed)
Patient would like to change to Eliquis instead of Coumadin. She should be able to take the 5 mg twice daily dose. She does have a voucher for 30 day supply. We will give her a prescription to take to the pharmacy and see how much the subsequent monthly supplies would cost. If she is willing to proceed, she will continue her current Coumadin dose until completed, then 2 days later present to have an INR checked. If less than 2, she will start Eliquis. Otherwise continue her current regimen. I will see her back in 3 months.

## 2012-09-18 ENCOUNTER — Telehealth: Payer: Self-pay | Admitting: Cardiology

## 2012-09-18 NOTE — Telephone Encounter (Signed)
Patient informed via message machine. 

## 2012-09-18 NOTE — Telephone Encounter (Signed)
Called back BP has come down. Last took BP at 11:00am   142/88   patient will be leaving to go visit her sister in the hospital please leave message

## 2012-09-18 NOTE — Telephone Encounter (Signed)
Noted. Patient was taken off Norvasc recently due to relatively low blood pressures. Would stay off Norvasc for now and continue to monitor blood pressure.

## 2012-09-18 NOTE — Telephone Encounter (Signed)
Patient went to Dr. Lysbeth Galas office today and had blood pressure checked. Her blood pressure was 142/88 this was at 11:00 AM.

## 2012-11-15 ENCOUNTER — Other Ambulatory Visit (INDEPENDENT_AMBULATORY_CARE_PROVIDER_SITE_OTHER): Payer: Medicare Other

## 2012-11-15 ENCOUNTER — Telehealth: Payer: Self-pay | Admitting: *Deleted

## 2012-11-15 ENCOUNTER — Telehealth: Payer: Self-pay | Admitting: Family Medicine

## 2012-11-15 DIAGNOSIS — R5381 Other malaise: Secondary | ICD-10-CM

## 2012-11-15 DIAGNOSIS — R5383 Other fatigue: Secondary | ICD-10-CM

## 2012-11-15 DIAGNOSIS — I480 Paroxysmal atrial fibrillation: Secondary | ICD-10-CM

## 2012-11-15 LAB — CBC WITH DIFFERENTIAL/PLATELET
Basophils Absolute: 0 10*3/uL (ref 0.0–0.1)
Eosinophils Relative: 2 % (ref 0–5)
HCT: 35.4 % — ABNORMAL LOW (ref 36.0–46.0)
Lymphocytes Relative: 38 % (ref 12–46)
Lymphs Abs: 1.5 10*3/uL (ref 0.7–4.0)
MCV: 94.7 fL (ref 78.0–100.0)
Monocytes Absolute: 0.5 10*3/uL (ref 0.1–1.0)
Neutro Abs: 1.8 10*3/uL (ref 1.7–7.7)
Platelets: 178 10*3/uL (ref 150–400)
RBC: 3.74 MIL/uL — ABNORMAL LOW (ref 3.87–5.11)
RDW: 14.4 % (ref 11.5–15.5)
WBC: 3.8 10*3/uL — ABNORMAL LOW (ref 4.0–10.5)

## 2012-11-15 NOTE — Telephone Encounter (Signed)
Patient called with c/o of possible side effects to eliquis that she started in April 2014. Patient c/o seeing blood in her stool a few weeks ago and now sees it every time she has bowel movement or void. Also, c/o dull headache and sometimes gets a weak feeling. Patient denies chest pain, sob or dizziness. Patient didn't take the eliquis pm dose last night. Nurse advised patient to hold eliquis dose this morning and provider would be informed.

## 2012-11-15 NOTE — Telephone Encounter (Signed)
Daughter called this pm. Had CBC done at [facility] ordered by Dr Cindra Eves because of bleeding in stools. Patient has been on Eliquis. This was stopped yesterday. Patient had 4 bloody stools yesterday. Feels a little weak yesterday. No bleeding today. CBC was drawn at 4 pm. Our CBC machine has been down and therefore no results. Blood sample probably sent out. Advised daughter that I would prefer that we have her mother evaluated in the ER now because she may need a blood transfusion.  Taira Knabe P. Modesto Charon, M.D.

## 2012-11-15 NOTE — Telephone Encounter (Signed)
Patient informed. 

## 2012-11-15 NOTE — Progress Notes (Unsigned)
Pt arrived for labs per dr.leonard nyland.copies to s.[facility] and L.[facility]

## 2012-11-15 NOTE — Telephone Encounter (Signed)
HOLD Eliquis and stat CBC

## 2012-11-16 ENCOUNTER — Telehealth: Payer: Self-pay | Admitting: Family Medicine

## 2012-11-16 ENCOUNTER — Telehealth: Payer: Self-pay | Admitting: Physician Assistant

## 2012-11-16 NOTE — Telephone Encounter (Signed)
Patient called answering service for results of "tests" done. I called the patient twice and there was no answer. It looks like she had a CBC done yesterday with unremarkable results. Tereso Newcomer, PA-C   11/16/2012 2:37 PM

## 2012-11-16 NOTE — Telephone Encounter (Signed)
Left message with patient's answering machine. Spoke with [name] the daughter-in-law. HGB is 12 better than before. Patient feels good today. No bleeding no headache and feels stronger. Observe. Avoid the Eliquis. Check with Dr Diona Browner on Monday options for treatment.  Therisa Mennella P. Modesto Charon, M.D.

## 2012-11-18 ENCOUNTER — Telehealth: Payer: Self-pay | Admitting: Cardiology

## 2012-11-18 NOTE — Telephone Encounter (Signed)
Bleeding from rectum on Thursday, stopped her medication and it stopped bleeding.  Has not restarted the medication. She has experiencing SOB and weak with dull headache.  Would like to know what she needs to do

## 2012-11-19 NOTE — Telephone Encounter (Signed)
Make sure that this patient has appointment for followup of his GI bleed. I am not sure which provider she is seeing.

## 2012-11-19 NOTE — Telephone Encounter (Signed)
Patient awaiting response from MD about medications management for atrial fibrillation since she was unable to take the eliquis due to side effects of bleeding problems.

## 2012-11-19 NOTE — Telephone Encounter (Signed)
Reviewed. Just apprised of this situation today. Patient stopped Eliquis at the direction of Mr. Magnus Sinning, reportedly rectal bleeding also stopped. CBC obtained on 6/6 showing normal hemoglobin 12.2 MCV 94. She was previously on Coumadin, did not report having similar bleeding problems. At this point recommend that she have stool studies, followup with Dr. Christell Constant as she may need GI evaluation, perhaps potential source of bleeding was  "unmasked." If this workup is reassuring however, decision will then need to be made about whether we should go back to Coumadin, or try Eliquis again.

## 2012-11-19 NOTE — Telephone Encounter (Signed)
Dr Diona Browner, This patient has never been seen at [facility]. Please forward results to her PCP.

## 2012-11-20 NOTE — Telephone Encounter (Signed)
Patient informed. Copy of labs and phone note sent to Dr. Joyce Copa office.

## 2012-11-20 NOTE — Telephone Encounter (Signed)
Patient left message on nurse's voicemail that she saw her PCP on today and it was felt that the bleeding was coming from her hemorrhoids and advised patient to restart the eliquis.

## 2012-11-20 NOTE — Telephone Encounter (Signed)
Reviewed recent note from [facility] (Dr. Modesto Charon) indicating review of the hemoglobin ordered by Mr. Serpe PA-C on 6/6, recommendation to not continue Eliquis also. As noted previously, she was tolerating Coumadin and this might ultimately need to be resumed, however in light of her recent stool changes, she should have a GI evaluation first. I will ask nursing to coordinate this with her PCP and GI.  She has seen Dr. Leone Payor in the past with a history of hematochezia, prior documentation of hemorrhoids and also colonic polyps.

## 2012-12-11 ENCOUNTER — Encounter: Payer: Self-pay | Admitting: Cardiology

## 2012-12-11 ENCOUNTER — Ambulatory Visit (INDEPENDENT_AMBULATORY_CARE_PROVIDER_SITE_OTHER): Payer: Medicare Other | Admitting: Cardiology

## 2012-12-11 VITALS — BP 139/83 | HR 61 | Ht 66.0 in | Wt 185.0 lb

## 2012-12-11 DIAGNOSIS — I480 Paroxysmal atrial fibrillation: Secondary | ICD-10-CM

## 2012-12-11 DIAGNOSIS — K649 Unspecified hemorrhoids: Secondary | ICD-10-CM

## 2012-12-11 DIAGNOSIS — I10 Essential (primary) hypertension: Secondary | ICD-10-CM

## 2012-12-11 DIAGNOSIS — I4891 Unspecified atrial fibrillation: Secondary | ICD-10-CM

## 2012-12-11 DIAGNOSIS — K648 Other hemorrhoids: Secondary | ICD-10-CM

## 2012-12-11 NOTE — Assessment & Plan Note (Signed)
Continue current medications including amiodarone and Eliquis. Followup arranged.

## 2012-12-11 NOTE — Progress Notes (Signed)
Clinical Summary [name] is an 79 y.o.female last seen in April of this year.   [name] switched to Eliquis from Coumadin and reported seeing blood in her stools. CBC on 6/6 showed hemoglobin of 12.2, platelets 178. [name] saw her primary care physician, was temporarily off Eliquis. [name] does have a history of hematochezia with hemorrhoids and also colonic polyps, seen previously by Dr. Leone Payor.  [name] tells me that [name] has seen no further blood in her stools. [name] has been back on Eliquis without other obvious issues. We discussed this today, actually [name] should have less bleeding risk with this then Coumadin. For now no changes made, although if [name] continues to have evidence of bleeding, we would refer her back to GI to make sure that nothing has changed compared to prior endoscopy.   No Known Allergies  Current Outpatient Prescriptions  Medication Sig Dispense Refill  . amiodarone (PACERONE) 200 MG tablet Take 0.5 tablets (100 mg total) by mouth every evening.  30 tablet  6  . apixaban (ELIQUIS) 5 MG TABS tablet Take 1 tablet (5 mg total) by mouth 2 (two) times daily.  60 tablet  3  . bimatoprost (LUMIGAN) 0.01 % SOLN Place 1 drop into both eyes at bedtime.      . calcium-vitamin D (OSCAL WITH D) 500-200 MG-UNIT per tablet Take 2 tablets by mouth daily.        . cyanocobalamin (,VITAMIN B-12,) 1000 MCG/ML injection Inject 1,000 mcg into the muscle every 30 (thirty) days.        . Glucosamine-Chondroit-Vit C-Mn (GLUCOSAMINE CHONDR 1500 COMPLX PO) Take 2 by mouth daily        . Melatonin 3 MG TABS Take 1 tablet by mouth at bedtime.      . Multiple Vitamin (MULTIVITAMIN) tablet Take 1 tablet by mouth daily.        . nadolol (CORGARD) 40 MG tablet Take 1 tablet (40 mg total) by mouth daily with breakfast.  30 tablet  11  . nitroGLYCERIN (NITROSTAT) 0.4 MG SL tablet Place 1 tablet (0.4 mg total) under the tongue every 5 (five) minutes as needed for chest pain.  90 tablet  3  . pravastatin  (PRAVACHOL) 20 MG tablet Take 1 tablet (20 mg total) by mouth daily.  30 tablet  11   No current facility-administered medications for this visit.    Past Medical History  Diagnosis Date  . Arthritis   . Glaucoma   . Diverticulosis   . GERD (gastroesophageal reflux disease)   . Mixed hyperlipidemia   . Essential hypertension, benign   . Colon polyps   . Paroxysmal atrial fibrillation   . Atrial flutter     s/p RFA 2003  . Pneumonia     7/03, with encephalopathy  . Sick sinus syndrome     s/p PPM (SJM)  . Varicose veins   . DVT, lower extremity   . IBS (irritable bowel syndrome)   . Osteoporosis   . Breast cancer     1980's - lumpectomy only  . Sleep apnea     STOP BANG score 5    Social History [name] reports that [name] has never smoked. [name] has never used smokeless tobacco. [name] reports that [name] does not drink alcohol.  Review of Systems No progressive palpitations. Has "good and bad days." Stable appetite. Otherwise negative.  Physical Examination Filed Vitals:   12/11/12 1338  BP: 139/83  Pulse: 61   Filed Weights  12/11/12 1338  Weight: 185 lb (83.915 kg)    Comfortable at rest.  HEENT: Conjunctiva and lids normal, oropharynx clear.  Neck: Supple, no carotid bruits, no elevated jugular venous pressure.  Lungs: Clear to auscultation, nonlabored.  Cardiac: Regular rate and rhythm, no significant systolic murmur or S3..  Abdomen: Soft, bowel sounds present.  Extremities: No pitting. Verrucous veins noted.  Musculoskeletal: No kyphosis.  Skin: Warm and dry.  Neuropsychiatric: Alert and oriented x3, affect appropriate.   Problem List and Plan   Paroxysmal atrial fibrillation Continue current medications including amiodarone and Eliquis. Followup arranged.  Hemorrhoids, with bleeding This seems to have settled down at this point, although if [name] continues to have evidence of leaning, referral back to GI will be needed.  Essential  hypertension, benign No change to current regimen.    Jonelle Sidle, M.D., F.A.C.C.

## 2012-12-11 NOTE — Assessment & Plan Note (Signed)
No change to current regimen. 

## 2012-12-11 NOTE — Assessment & Plan Note (Signed)
This seems to have settled down at this point, although if she continues to have evidence of leaning, referral back to GI will be needed.

## 2012-12-11 NOTE — Patient Instructions (Addendum)
Your physician recommends that you schedule a follow-up appointment in: 4 months. You will receive a reminder letter in the mail in about 1-2 months reminding you to call and schedule your appointment. If you don't receive this letter, please contact our office. Your physician recommends that you continue on your current medications as directed. Please refer to the Current Medication list given to you today. 

## 2013-02-11 ENCOUNTER — Other Ambulatory Visit: Payer: Self-pay | Admitting: Cardiology

## 2013-04-15 ENCOUNTER — Encounter: Payer: Self-pay | Admitting: Cardiology

## 2013-04-15 ENCOUNTER — Ambulatory Visit (INDEPENDENT_AMBULATORY_CARE_PROVIDER_SITE_OTHER): Payer: Medicare Other | Admitting: Cardiology

## 2013-04-15 VITALS — BP 132/82 | HR 60 | Ht 64.5 in | Wt 186.0 lb

## 2013-04-15 DIAGNOSIS — I495 Sick sinus syndrome: Secondary | ICD-10-CM

## 2013-04-15 DIAGNOSIS — Z5181 Encounter for therapeutic drug level monitoring: Secondary | ICD-10-CM

## 2013-04-15 DIAGNOSIS — I480 Paroxysmal atrial fibrillation: Secondary | ICD-10-CM

## 2013-04-15 DIAGNOSIS — I10 Essential (primary) hypertension: Secondary | ICD-10-CM

## 2013-04-15 DIAGNOSIS — I4891 Unspecified atrial fibrillation: Secondary | ICD-10-CM

## 2013-04-15 DIAGNOSIS — Z7901 Long term (current) use of anticoagulants: Secondary | ICD-10-CM

## 2013-04-15 NOTE — Assessment & Plan Note (Signed)
Status post [employer] pacemaker, followed by Dr. Allred. 

## 2013-04-15 NOTE — Patient Instructions (Addendum)
Continue all current medications. Lab for BMET, CBC - just prior to next office visit - orders given today Follow up in  4 months

## 2013-04-15 NOTE — Assessment & Plan Note (Signed)
No change to current regimen. 

## 2013-04-15 NOTE — Progress Notes (Signed)
Clinical Summary Ms. Kuba is an 79 y.o.female last seen in July of this year. She has been doing well, no significant palpitations, no bleeding problems on Eliquis.  Lexiscan Myoview in February of this year was negative for ischemia with LVEF 63%.  Lab work in June showed hemoglobin 12.2. She does have a history of hemorrhoidal bleeding. She reports this has not been a recurring issue however.  Functional with her ADLs. Also enjoys baking.  No Known Allergies  Current Outpatient Prescriptions  Medication Sig Dispense Refill  . amiodarone (PACERONE) 200 MG tablet Take 0.5 tablets (100 mg total) by mouth every evening.  30 tablet  6  . bimatoprost (LUMIGAN) 0.01 % SOLN Place 1 drop into both eyes at bedtime.      . calcium-vitamin D (OSCAL WITH D) 500-200 MG-UNIT per tablet Take 2 tablets by mouth daily.        . cyanocobalamin (,VITAMIN B-12,) 1000 MCG/ML injection Inject 1,000 mcg into the muscle every 30 (thirty) days.        Marland Kitchen ELIQUIS 5 MG TABS tablet TAKE ONE TABLET BY MOUTH TWICE DAILY  60 tablet  4  . Glucosamine-Chondroit-Vit C-Mn (GLUCOSAMINE CHONDR 1500 COMPLX PO) Take 2 by mouth daily        . Multiple Vitamin (MULTIVITAMIN) tablet Take 1 tablet by mouth daily.        . nadolol (CORGARD) 40 MG tablet Take 1 tablet (40 mg total) by mouth daily with breakfast.  30 tablet  11  . nitroGLYCERIN (NITROSTAT) 0.4 MG SL tablet Place 1 tablet (0.4 mg total) under the tongue every 5 (five) minutes as needed for chest pain.  90 tablet  3  . pravastatin (PRAVACHOL) 20 MG tablet Take 1 tablet (20 mg total) by mouth daily.  30 tablet  11   No current facility-administered medications for this visit.    Past Medical History  Diagnosis Date  . Arthritis   . Glaucoma   . Diverticulosis   . GERD (gastroesophageal reflux disease)   . Mixed hyperlipidemia   . Essential hypertension, benign   . Colon polyps   . Paroxysmal atrial fibrillation   . Atrial flutter     s/p RFA 2003  .  Pneumonia     7/03, with encephalopathy  . Sick sinus syndrome     s/p PPM (SJM)  . Varicose veins   . DVT, lower extremity   . IBS (irritable bowel syndrome)   . Osteoporosis   . Breast cancer     1980's - lumpectomy only  . Sleep apnea     STOP BANG score 5    Social History Ms. Abdalla reports that she has never smoked. She has never used smokeless tobacco. Ms. Caya reports that she does not drink alcohol.  Review of Systems Negative except as outlined.  Physical Examination Filed Vitals:   04/15/13 0949  BP: 132/82  Pulse: 60   Filed Weights   04/15/13 0949  Weight: 186 lb (84.369 kg)    Comfortable at rest.  HEENT: Conjunctiva and lids normal, oropharynx clear.  Neck: Supple, no carotid bruits, no elevated jugular venous pressure.  Lungs: Clear to auscultation, nonlabored.  Cardiac: Regular rate and rhythm, no significant systolic murmur or S3..  Abdomen: Soft, bowel sounds present.  Extremities: No pitting. Verrucous veins noted.  Musculoskeletal: No kyphosis.  Skin: Warm and dry.  Neuropsychiatric: Alert and oriented x3, affect appropriate.   Problem List and Plan   Paroxysmal atrial fibrillation  Continues on amiodarone, Eliquis, and nadolol. Followup BMET and CBC for next visit.  Sick sinus syndrome Status post St. Jude pacemaker, followed by Dr. Johney Frame.  Essential hypertension, benign No change to current regimen.    Jonelle Sidle, M.D., F.A.C.C.

## 2013-04-15 NOTE — Assessment & Plan Note (Signed)
Continues on amiodarone, Eliquis, and nadolol. Followup BMET and CBC for next visit.

## 2013-06-09 ENCOUNTER — Telehealth: Payer: Self-pay | Admitting: Cardiology

## 2013-06-09 NOTE — Telephone Encounter (Signed)
BLUE CROSS WILL NOT PAY FOR ELIQUIS 5 MG PO TABS effective 06/12/2013  Please advise patient .

## 2013-06-10 NOTE — Telephone Encounter (Signed)
Per patient, she spoke with Tyler Aas whom represents her insurance on yesterday and was informed that Eliquis would no longer be covered and if MD wanted her to stay on this, a formulary exception would have to be requested. Nurse advised patient that our office would be in contact with her insurance. Patient informed nurse that she did get a new prescription on yesterday.

## 2013-06-23 NOTE — Telephone Encounter (Signed)
Called to verify insurance information.

## 2013-06-26 ENCOUNTER — Other Ambulatory Visit: Payer: Self-pay | Admitting: *Deleted

## 2013-06-26 MED ORDER — APIXABAN 5 MG PO TABS: 5.0000 mg | ORAL_TABLET | Freq: Two times a day (BID) | ORAL | Status: DC

## 2013-07-28 ENCOUNTER — Encounter: Payer: Medicare Other | Admitting: Internal Medicine

## 2013-08-09 ENCOUNTER — Other Ambulatory Visit: Payer: Self-pay | Admitting: Internal Medicine

## 2013-08-13 ENCOUNTER — Other Ambulatory Visit: Payer: Self-pay | Admitting: Cardiology

## 2013-08-13 LAB — BASIC METABOLIC PANEL
BUN: 12 mg/dL (ref 6–23)
CALCIUM: 9.5 mg/dL (ref 8.4–10.5)
CHLORIDE: 103 meq/L (ref 96–112)
CO2: 26 mEq/L (ref 19–32)
CREATININE: 0.75 mg/dL (ref 0.50–1.10)
Glucose, Bld: 90 mg/dL (ref 70–99)
Potassium: 4 mEq/L (ref 3.5–5.3)
Sodium: 140 mEq/L (ref 135–145)

## 2013-08-13 LAB — CBC WITH DIFFERENTIAL/PLATELET
BASOS ABS: 0 10*3/uL (ref 0.0–0.1)
Basophils Relative: 0 % (ref 0–1)
EOS ABS: 0.1 10*3/uL (ref 0.0–0.7)
EOS PCT: 1 % (ref 0–5)
HEMATOCRIT: 35.2 % — AB (ref 36.0–46.0)
Hemoglobin: 12.2 g/dL (ref 12.0–15.0)
LYMPHS PCT: 38 % (ref 12–46)
Lymphs Abs: 2 10*3/uL (ref 0.7–4.0)
MCH: 32.9 pg (ref 26.0–34.0)
MCHC: 34.7 g/dL (ref 30.0–36.0)
MCV: 94.9 fL (ref 78.0–100.0)
MONO ABS: 0.5 10*3/uL (ref 0.1–1.0)
Monocytes Relative: 10 % (ref 3–12)
Neutro Abs: 2.7 10*3/uL (ref 1.7–7.7)
Neutrophils Relative %: 51 % (ref 43–77)
PLATELETS: 236 10*3/uL (ref 150–400)
RBC: 3.71 MIL/uL — ABNORMAL LOW (ref 3.87–5.11)
RDW: 12.7 % (ref 11.5–15.5)
WBC: 5.3 10*3/uL (ref 4.0–10.5)

## 2013-08-14 ENCOUNTER — Telehealth: Payer: Self-pay | Admitting: *Deleted

## 2013-08-14 NOTE — Telephone Encounter (Signed)
Patient informed. 

## 2013-08-14 NOTE — Telephone Encounter (Signed)
Message copied by Merlene Laughter on Thu Aug 14, 2013  4:14 PM ------      Message from: MCDOWELL, Aloha Gell      Created: Wed Aug 13, 2013  2:51 PM       Reviewed. Hemoglobin normal at 12.2, normal creatinine 0.7. Continue current regimen. ------

## 2013-08-18 ENCOUNTER — Encounter: Payer: Self-pay | Admitting: Cardiology

## 2013-08-18 ENCOUNTER — Ambulatory Visit (INDEPENDENT_AMBULATORY_CARE_PROVIDER_SITE_OTHER): Payer: Medicare Other | Admitting: Cardiology

## 2013-08-18 VITALS — BP 149/89 | HR 59 | Ht 66.0 in | Wt 186.0 lb

## 2013-08-18 DIAGNOSIS — I4891 Unspecified atrial fibrillation: Secondary | ICD-10-CM

## 2013-08-18 DIAGNOSIS — I495 Sick sinus syndrome: Secondary | ICD-10-CM

## 2013-08-18 DIAGNOSIS — I480 Paroxysmal atrial fibrillation: Secondary | ICD-10-CM

## 2013-08-18 NOTE — Assessment & Plan Note (Signed)
Symptomatically well controlled. Continue low-dose amiodarone and Eliquis. Followup TSH and LFTs for next visit.

## 2013-08-18 NOTE — Assessment & Plan Note (Signed)
Status post St. Jude pacemaker, followed by Dr. Rayann Heman.

## 2013-08-18 NOTE — Patient Instructions (Signed)
Your physician recommends that you schedule a follow-up appointment in: 4 months. You will receive a reminder letter in the mail in about 1-2 months reminding you to call and schedule your appointment. If you don't receive this letter, please contact our office. Your physician recommends that you continue on your current medications as directed. Please refer to the Current Medication list given to you today. Your physician recommends that you have lab work in 4 months just before your next visit to check your TSH and LFT's. We will contact you when this is due.

## 2013-08-18 NOTE — Progress Notes (Signed)
Clinical Summary  Ms. Chouinard is an 80 y.o.female last seen in November 2014. She has done reasonably well from a cardiac perspective. No major breakthrough palpitations, no chest pain. She reports no obvious bleeding problems on Eliquis.  Lab work from this March showed hemoglobin 12.2, platelets 236, potassium 4.0, BUN 12, creatinine 0.7.  Lexiscan Myoview in February 2014 was negative for ischemia with LVEF 63%.   No Known Allergies  Current Outpatient Prescriptions  Medication Sig Dispense Refill  . amiodarone (PACERONE) 200 MG tablet Take 0.5 tablets (100 mg total) by mouth every evening.  30 tablet  6  . apixaban (ELIQUIS) 5 MG TABS tablet Take 1 tablet (5 mg total) by mouth 2 (two) times daily.  60 tablet  6  . bimatoprost (LUMIGAN) 0.01 % SOLN Place 1 drop into both eyes at bedtime.      . calcium-vitamin D (OSCAL WITH D) 500-200 MG-UNIT per tablet Take 2 tablets by mouth daily.        . cyanocobalamin (,VITAMIN B-12,) 1000 MCG/ML injection Inject 1,000 mcg into the muscle every 30 (thirty) days.        . Glucosamine-Chondroit-Vit C-Mn (GLUCOSAMINE CHONDR 1500 COMPLX PO) Take 2 by mouth daily        . Multiple Vitamin (MULTIVITAMIN) tablet Take 1 tablet by mouth daily.        . nadolol (CORGARD) 40 MG tablet TAKE 1 TABLET BY MOUTH ONCE A DAY WITH BREAKFAST  30 tablet  10  . nitroGLYCERIN (NITROSTAT) 0.4 MG SL tablet Place 1 tablet (0.4 mg total) under the tongue every 5 (five) minutes as needed for chest pain.  90 tablet  3  . pravastatin (PRAVACHOL) 20 MG tablet Take 1 tablet (20 mg total) by mouth daily.  30 tablet  11  . prednisoLONE 5 MG TABS tablet Take 5 mg by mouth as directed.       No current facility-administered medications for this visit.    Past Medical History  Diagnosis Date  . Arthritis   . Glaucoma   . Diverticulosis   . GERD (gastroesophageal reflux disease)   . Mixed hyperlipidemia   . Essential hypertension, benign   . Colon polyps   . Paroxysmal  atrial fibrillation   . Atrial flutter     s/p RFA 2003  . Pneumonia     7/03, with encephalopathy  . Sick sinus syndrome     s/p PPM (SJM)  . Varicose veins   . DVT, lower extremity   . IBS (irritable bowel syndrome)   . Osteoporosis   . Breast cancer     1980's - lumpectomy only  . Sleep apnea     STOP BANG score 5    Past Surgical History  Procedure Laterality Date  . Cholecystectomy    . Pacemaker insertion  2003  . Breast lumpectomy      Right breast   . Colonoscopy w/ biopsies and polypectomy  03/2003, 05/2008, 02/20/2011    severe diverticulosis, tubulovillous adenoma polyp, internal and external hemorrhoids 2012: severe diverticulosis, 4 mm polyp, hemorrhoids  . Cataracts    . Fractured wrist      repair  . Total knee arthroplasty  12/20/2011    Procedure: TOTAL KNEE ARTHROPLASTY;  Surgeon: Tobi Bastos, MD;  Location: [facility];  Service: Orthopedics;  Laterality: Left;  . Pacemaker generator change  06/18/12    SJM [employer] DR RF, Dr Rayann Heman    Social History Ms. Connaughton reports that she  has never smoked. She has never used smokeless tobacco. Ms. July reports that she does not drink alcohol.  Review of Systems Having trouble sleeping. Stable appetite. Otherwise negative.  Physical Examination Filed Vitals:   08/18/13 1139  BP: 149/89  Pulse: 59   Filed Weights   08/18/13 1139  Weight: 186 lb (84.369 kg)    Comfortable at rest.  HEENT: Conjunctiva and lids normal, oropharynx clear.  Neck: Supple, no carotid bruits, no elevated jugular venous pressure.  Lungs: Clear to auscultation, nonlabored.  Cardiac: Regular rate and rhythm, no significant systolic murmur or S3..  Abdomen: Soft, bowel sounds present.  Extremities: No pitting. Varicose veins noted.  Musculoskeletal: No kyphosis.  Skin: Warm and dry.  Neuropsychiatric: Alert and oriented x3, affect appropriate.   Problem List and Plan   Paroxysmal atrial fibrillation Symptomatically well  controlled. Continue low-dose amiodarone and Eliquis. Followup TSH and LFTs for next visit.  Sick sinus syndrome Status post St. Jude pacemaker, followed by Dr. Rayann Heman.    Satira Sark, M.D., F.A.C.C.

## 2013-09-08 ENCOUNTER — Other Ambulatory Visit: Payer: Self-pay | Admitting: Internal Medicine

## 2013-09-22 ENCOUNTER — Encounter: Payer: Self-pay | Admitting: Internal Medicine

## 2013-09-22 ENCOUNTER — Ambulatory Visit (INDEPENDENT_AMBULATORY_CARE_PROVIDER_SITE_OTHER): Payer: Medicare Other | Admitting: Internal Medicine

## 2013-09-22 VITALS — BP 144/81 | HR 70 | Ht 66.0 in | Wt 186.4 lb

## 2013-09-22 DIAGNOSIS — I4891 Unspecified atrial fibrillation: Secondary | ICD-10-CM

## 2013-09-22 DIAGNOSIS — I495 Sick sinus syndrome: Secondary | ICD-10-CM

## 2013-09-22 DIAGNOSIS — I10 Essential (primary) hypertension: Secondary | ICD-10-CM

## 2013-09-22 DIAGNOSIS — I480 Paroxysmal atrial fibrillation: Secondary | ICD-10-CM

## 2013-09-22 LAB — MDC_IDC_ENUM_SESS_TYPE_INCLINIC
Battery Remaining Longevity: 103.2 mo
Battery Voltage: 2.98 V
Brady Statistic RA Percent Paced: 99.28 %
Implantable Pulse Generator Model: 2210
Implantable Pulse Generator Serial Number: 7438567
Lead Channel Pacing Threshold Amplitude: 0.75 V
Lead Channel Pacing Threshold Pulse Width: 0.5 ms
Lead Channel Pacing Threshold Pulse Width: 0.5 ms
Lead Channel Sensing Intrinsic Amplitude: 12 mV
Lead Channel Setting Pacing Amplitude: 1 V
Lead Channel Setting Pacing Pulse Width: 0.5 ms
Lead Channel Setting Sensing Sensitivity: 2 mV
MDC IDC MSMT LEADCHNL RA IMPEDANCE VALUE: 362.5 Ohm
MDC IDC MSMT LEADCHNL RA PACING THRESHOLD AMPLITUDE: 1 V
MDC IDC MSMT LEADCHNL RV IMPEDANCE VALUE: 587.5 Ohm
MDC IDC SESS DTM: 20150413104622
MDC IDC SET LEADCHNL RA PACING AMPLITUDE: 2 V
MDC IDC STAT BRADY RV PERCENT PACED: 27 %

## 2013-09-22 NOTE — Progress Notes (Signed)
PCP:  Sherrie Mustache, MD Primary Cardiologist: Dr Domenic Polite  The patient presents today for electrophysiology followup.  Since her last visit, the patient reports doing reasonably well. She has been active.  She is mostly concerned about arthritis.  Her SOB and CP are resolved.  Today, she denies symptoms of palpitations, orthopnea, PND, lower extremity edema, dizziness, presyncope, syncope, or neurologic sequela.  The patient feels that she is tolerating medications without difficulties and is otherwise without complaint today.   Past Medical History  Diagnosis Date  . Arthritis   . Glaucoma   . Diverticulosis   . GERD (gastroesophageal reflux disease)   . Mixed hyperlipidemia   . Essential hypertension, benign   . Colon polyps   . Paroxysmal atrial fibrillation   . Atrial flutter     s/p RFA 2003  . Pneumonia     7/03, with encephalopathy  . Sick sinus syndrome     s/p PPM (SJM)  . Varicose veins   . DVT, lower extremity   . IBS (irritable bowel syndrome)   . Osteoporosis   . Breast cancer     1980's - lumpectomy only  . Sleep apnea     STOP BANG score 5   Past Surgical History  Procedure Laterality Date  . Cholecystectomy    . Pacemaker insertion  2003  . Breast lumpectomy      Right breast   . Colonoscopy w/ biopsies and polypectomy  03/2003, 05/2008, 02/20/2011    severe diverticulosis, tubulovillous adenoma polyp, internal and external hemorrhoids 2012: severe diverticulosis, 4 mm polyp, hemorrhoids  . Cataracts    . Fractured wrist      repair  . Total knee arthroplasty  12/20/2011    Procedure: TOTAL KNEE ARTHROPLASTY;  Surgeon: Tobi Bastos, MD;  Location: [facility];  Service: Orthopedics;  Laterality: Left;  . Pacemaker generator change  06/18/12    SJM [facility] DR RF, Dr Rayann Heman    Current Outpatient Prescriptions  Medication Sig Dispense Refill  . amiodarone (PACERONE) 200 MG tablet TAKE 1/2 TABLET (100MG ) BY MOUTH ONCE A DAY EVERY EVENING  30 tablet   5  . apixaban (ELIQUIS) 5 MG TABS tablet Take 1 tablet (5 mg total) by mouth 2 (two) times daily.  60 tablet  6  . bimatoprost (LUMIGAN) 0.01 % SOLN Place 1 drop into both eyes at bedtime.      . calcium-vitamin D (OSCAL WITH D) 500-200 MG-UNIT per tablet Take 2 tablets by mouth daily.        . cyanocobalamin (,VITAMIN B-12,) 1000 MCG/ML injection Inject 1,000 mcg into the muscle every 30 (thirty) days.        . Glucosamine-Chondroit-Vit C-Mn (GLUCOSAMINE CHONDR 1500 COMPLX PO) Take 2 by mouth daily        . Multiple Vitamin (MULTIVITAMIN) tablet Take 1 tablet by mouth daily.        . nadolol (CORGARD) 40 MG tablet TAKE 1 TABLET BY MOUTH ONCE A DAY WITH BREAKFAST  30 tablet  10  . nitroGLYCERIN (NITROSTAT) 0.4 MG SL tablet Place 1 tablet (0.4 mg total) under the tongue every 5 (five) minutes as needed for chest pain.  90 tablet  3  . pravastatin (PRAVACHOL) 20 MG tablet Take 1 tablet (20 mg total) by mouth daily.  30 tablet  11   No current facility-administered medications for this visit.    No Known Allergies  History   Social History  . Marital Status: Widowed  Spouse Name: N/A    Number of Children: N/A  . Years of Education: N/A   Occupational History  . Retired    Social History Main Topics  . Smoking status: Never Smoker   . Smokeless tobacco: Never Used  . Alcohol Use: No  . Drug Use: No  . Sexual Activity: Not on file   Other Topics Concern  . Not on file   Social History Narrative  . No narrative on file    Family History  Problem Relation Age of Onset  . Colon cancer Brother   . Colon cancer Other     Grandfather  . Cancer Father   . Stroke Mother   . Heart attack      CHF grandmother    Physical Exam: Filed Vitals:   09/22/13 1033  BP: 144/81  Pulse: 70  Height: 5\' 6"  (1.676 m)  Weight: 186 lb 6.4 oz (84.55 kg)  SpO2: 97%    GEN- The patient is elderly appearing, alert and oriented x 3 today.   Head- normocephalic, atraumatic Eyes-   Sclera clear, conjunctiva pink Ears- hearing intact Oropharynx- clear Neck- supple, Lungs- Clear to ausculation bilaterally, normal work of breathing Chest- pacemaker pocket is well healed Heart- Regular rate and rhythm, no murmurs, rubs or gallops, PMI not laterally displaced GI- soft, NT, ND, + BS Extremities- no clubbing, cyanosis, or edema Neuro- strength and sensation are intact  Pacemaker interrogation- reviewed in detail today,  See PACEART report  Assessment and Plan:  1, Sinus bradycardia Normal pacemaker function See Pace Art report No changes today  2.  AF Maintaining sinus rhythm Continue coumadin Dr Domenic Polite to follow LFTs/TFTs  3. HTN Stable No change required today   Return to see Dr Domenic Polite as scheduled Merlin I will see in 1 year in the device clinic

## 2013-09-22 NOTE — Patient Instructions (Signed)
   Merlin remote check 12/24/2013 Continue all current medications. Your physician wants you to follow up in:  1 year.  You will receive a reminder letter in the mail one-two months in advance.  If you don't receive a letter, please call our office to schedule the follow up appointment - Dr. Rayann Heman

## 2013-10-08 ENCOUNTER — Telehealth: Payer: Self-pay | Admitting: *Deleted

## 2013-10-08 NOTE — Telephone Encounter (Signed)
Patient stated that when she was in office on 09/22/2013 to see Dr. Rayann Heman, someone was supposed to be mailing her a new device box.  Stated she received a letter that insurance will not cover this.  Informed patient that message will be sent to device clinic to be addressed.  Patient verbalized understanding.

## 2013-10-08 NOTE — Telephone Encounter (Signed)
Spoke w/pt in regards to bill. Billing phone number given to pt to call.

## 2013-12-08 DIAGNOSIS — I4891 Unspecified atrial fibrillation: Secondary | ICD-10-CM | POA: Insufficient documentation

## 2013-12-08 DIAGNOSIS — D510 Vitamin B12 deficiency anemia due to intrinsic factor deficiency: Secondary | ICD-10-CM | POA: Insufficient documentation

## 2013-12-10 ENCOUNTER — Other Ambulatory Visit: Payer: Self-pay | Admitting: *Deleted

## 2013-12-10 DIAGNOSIS — I480 Paroxysmal atrial fibrillation: Secondary | ICD-10-CM

## 2013-12-10 DIAGNOSIS — Z79899 Other long term (current) drug therapy: Secondary | ICD-10-CM

## 2013-12-10 NOTE — Progress Notes (Signed)
Patient will have lab work done at [facility] lab on the same day as her visit. Lab orders faxed to [facility] lab.

## 2013-12-18 ENCOUNTER — Other Ambulatory Visit: Payer: Self-pay | Admitting: *Deleted

## 2013-12-18 DIAGNOSIS — I480 Paroxysmal atrial fibrillation: Secondary | ICD-10-CM

## 2013-12-18 DIAGNOSIS — Z79899 Other long term (current) drug therapy: Secondary | ICD-10-CM

## 2013-12-24 ENCOUNTER — Ambulatory Visit (INDEPENDENT_AMBULATORY_CARE_PROVIDER_SITE_OTHER): Payer: Medicare Other | Admitting: *Deleted

## 2013-12-24 ENCOUNTER — Encounter: Payer: Self-pay | Admitting: Internal Medicine

## 2013-12-24 DIAGNOSIS — I480 Paroxysmal atrial fibrillation: Secondary | ICD-10-CM

## 2013-12-24 DIAGNOSIS — I4891 Unspecified atrial fibrillation: Secondary | ICD-10-CM

## 2013-12-24 DIAGNOSIS — I495 Sick sinus syndrome: Secondary | ICD-10-CM

## 2013-12-24 NOTE — Progress Notes (Signed)
Remote pacemaker transmission.   

## 2013-12-26 ENCOUNTER — Encounter: Payer: Self-pay | Admitting: Cardiology

## 2013-12-26 ENCOUNTER — Ambulatory Visit (INDEPENDENT_AMBULATORY_CARE_PROVIDER_SITE_OTHER): Payer: Medicare Other | Admitting: Cardiology

## 2013-12-26 VITALS — BP 143/82 | HR 60 | Ht 66.0 in | Wt 188.0 lb

## 2013-12-26 DIAGNOSIS — E785 Hyperlipidemia, unspecified: Secondary | ICD-10-CM

## 2013-12-26 DIAGNOSIS — I4891 Unspecified atrial fibrillation: Secondary | ICD-10-CM

## 2013-12-26 DIAGNOSIS — I495 Sick sinus syndrome: Secondary | ICD-10-CM

## 2013-12-26 DIAGNOSIS — I480 Paroxysmal atrial fibrillation: Secondary | ICD-10-CM

## 2013-12-26 LAB — MDC_IDC_ENUM_SESS_TYPE_REMOTE
Battery Remaining Percentage: 91 %
Battery Voltage: 2.98 V
Brady Statistic AS VP Percent: 1 %
Brady Statistic RA Percent Paced: 99 %
Date Time Interrogation Session: 20150715060014
Implantable Pulse Generator Model: 2210
Implantable Pulse Generator Serial Number: 7438567
Lead Channel Impedance Value: 540 Ohm
Lead Channel Pacing Threshold Amplitude: 1 V
Lead Channel Pacing Threshold Pulse Width: 0.5 ms
Lead Channel Pacing Threshold Pulse Width: 0.5 ms
Lead Channel Sensing Intrinsic Amplitude: 0.6 mV
Lead Channel Sensing Intrinsic Amplitude: 11.8 mV
Lead Channel Setting Pacing Amplitude: 1.125
Lead Channel Setting Pacing Amplitude: 2 V
Lead Channel Setting Sensing Sensitivity: 2 mV
MDC IDC MSMT BATTERY REMAINING LONGEVITY: 107 mo
MDC IDC MSMT LEADCHNL RA IMPEDANCE VALUE: 380 Ohm
MDC IDC MSMT LEADCHNL RV PACING THRESHOLD AMPLITUDE: 0.875 V
MDC IDC SET LEADCHNL RV PACING PULSEWIDTH: 0.5 ms
MDC IDC STAT BRADY AP VP PERCENT: 5.7 %
MDC IDC STAT BRADY AP VS PERCENT: 94 %
MDC IDC STAT BRADY AS VS PERCENT: 1 %
MDC IDC STAT BRADY RV PERCENT PACED: 5.7 %

## 2013-12-26 NOTE — Assessment & Plan Note (Signed)
Symptomatically well controlled on amiodarone and Eliquis. Followup lab work drawn earlier today is pending. ECG shows an atrial paced rhythm.

## 2013-12-26 NOTE — Assessment & Plan Note (Signed)
Now on Zetia with history of statin intolerance.

## 2013-12-26 NOTE — Patient Instructions (Signed)

## 2013-12-26 NOTE — Assessment & Plan Note (Signed)
St. Jude pacemaker, followed by Dr. Rayann Heman.

## 2013-12-26 NOTE — Progress Notes (Signed)
Clinical Summary [name] is an 80 y.o.female last seen in March. Interval follow with Dr. Rayann Heman noted. She has been doing well overall. Enjoys getting outdoors when it is not too hot to work in her garden. She is not reporting any palpitations or chest pain. ECG today shows an atrial paced rhythm. She did have followup lab work done earlier today, results pending.  Lexiscan Myoview in February 2014 was negative for ischemia with LVEF 63%.  I see that she has been switched from Pravachol to Zetia, states that she was having some trouble with muscle stiffness and discussed this with Dr. Edrick Oh.   No Known Allergies  Current Outpatient Prescriptions  Medication Sig Dispense Refill  . amiodarone (PACERONE) 200 MG tablet TAKE 1/2 TABLET (100MG ) BY MOUTH ONCE A DAY EVERY EVENING  30 tablet  5  . apixaban (ELIQUIS) 5 MG TABS tablet Take 1 tablet (5 mg total) by mouth 2 (two) times daily.  60 tablet  6  . bimatoprost (LUMIGAN) 0.01 % SOLN Place 1 drop into both eyes at bedtime.      . calcium-vitamin D (OSCAL WITH D) 500-200 MG-UNIT per tablet Take 2 tablets by mouth daily.        . Coenzyme Q10 300 MG CAPS Take 1 capsule by mouth daily.      . cyanocobalamin (,VITAMIN B-12,) 1000 MCG/ML injection Inject 1,000 mcg into the muscle every 30 (thirty) days.        Marland Kitchen ezetimibe (ZETIA) 10 MG tablet Take 10 mg by mouth daily.      . Glucosamine-Chondroit-Vit C-Mn (GLUCOSAMINE CHONDR 1500 COMPLX PO) Take 2 by mouth daily        . Multiple Vitamin (MULTIVITAMIN) tablet Take 1 tablet by mouth daily.        . nadolol (CORGARD) 40 MG tablet TAKE 1 TABLET BY MOUTH ONCE A DAY WITH BREAKFAST  30 tablet  10  . nitroGLYCERIN (NITROSTAT) 0.4 MG SL tablet Place 1 tablet (0.4 mg total) under the tongue every 5 (five) minutes as needed for chest pain.  90 tablet  3   No current facility-administered medications for this visit.    Past Medical History  Diagnosis Date  . Arthritis   . Glaucoma   .  Diverticulosis   . GERD (gastroesophageal reflux disease)   . Mixed hyperlipidemia   . Essential hypertension, benign   . Colon polyps   . Paroxysmal atrial fibrillation   . Atrial flutter     s/p RFA 2003  . Pneumonia     7/03, with encephalopathy  . Sick sinus syndrome     s/p PPM (SJM)  . Varicose veins   . DVT, lower extremity   . IBS (irritable bowel syndrome)   . Osteoporosis   . Breast cancer     1980's - lumpectomy only  . Sleep apnea     STOP BANG score 5    Past Surgical History  Procedure Laterality Date  . Cholecystectomy    . Pacemaker insertion  2003  . Breast lumpectomy      Right breast   . Colonoscopy w/ biopsies and polypectomy  03/2003, 05/2008, 02/20/2011    severe diverticulosis, tubulovillous adenoma polyp, internal and external hemorrhoids 2012: severe diverticulosis, 4 mm polyp, hemorrhoids  . Cataracts    . Fractured wrist      repair  . Total knee arthroplasty  12/20/2011    Procedure: TOTAL KNEE ARTHROPLASTY;  Surgeon: Tobi Bastos, MD;  Location:  [facility];  Service: Orthopedics;  Laterality: Left;  . Pacemaker generator change  06/18/12    SJM [facility] DR RF, Dr Rayann Heman    Social History [name] reports that she has never smoked. She has never used smokeless tobacco. [name] reports that she does not drink alcohol.  Review of Systems As outlined above. Other systems reviewed and negative.  Physical Examination Filed Vitals:   12/26/13 1342  BP: 143/82  Pulse: 60   Filed Weights   12/26/13 1342  Weight: 188 lb (85.276 kg)    Comfortable at rest.  HEENT: Conjunctiva and lids normal, oropharynx clear.  Neck: Supple, no carotid bruits, no elevated jugular venous pressure.  Lungs: Clear to auscultation, nonlabored.  Cardiac: Regular rate and rhythm, no significant systolic murmur or S3..  Abdomen: Soft, bowel sounds present.  Extremities: No pitting. Varicose veins noted.  Musculoskeletal: No kyphosis.  Skin: Warm and dry.    Neuropsychiatric: Alert and oriented x3, affect appropriate.   Problem List and Plan   Paroxysmal atrial fibrillation Symptomatically well controlled on amiodarone and Eliquis. Followup lab work drawn earlier today is pending. ECG shows an atrial paced rhythm.  Sick sinus syndrome St. Jude pacemaker, followed by Dr. Rayann Heman.  HYPERLIPIDEMIA-MIXED Now on Zetia with history of statin intolerance.    Satira Sark, M.D., F.A.C.C.

## 2014-01-01 ENCOUNTER — Telehealth: Payer: Self-pay | Admitting: *Deleted

## 2014-01-01 NOTE — Telephone Encounter (Signed)
Message copied by Merlene Laughter on Thu Jan 01, 2014  2:19 PM ------      Message from: MCDOWELL, Aloha Gell      Created: Thu Jan 01, 2014 12:43 PM       Reviewed. Please let her know that LFTs and TSH are normal. ------

## 2014-01-01 NOTE — Telephone Encounter (Signed)
Patient informed. 

## 2014-01-07 ENCOUNTER — Encounter: Payer: Self-pay | Admitting: Cardiology

## 2014-02-11 ENCOUNTER — Other Ambulatory Visit: Payer: Self-pay | Admitting: *Deleted

## 2014-02-11 MED ORDER — APIXABAN 5 MG PO TABS: 5.0000 mg | ORAL_TABLET | Freq: Two times a day (BID) | ORAL | Status: DC

## 2014-02-17 ENCOUNTER — Other Ambulatory Visit: Payer: Self-pay | Admitting: *Deleted

## 2014-02-17 MED ORDER — APIXABAN 5 MG PO TABS: 5.0000 mg | ORAL_TABLET | Freq: Two times a day (BID) | ORAL | Status: DC

## 2014-02-27 ENCOUNTER — Other Ambulatory Visit: Payer: Self-pay | Admitting: Orthopedic Surgery

## 2014-02-27 DIAGNOSIS — G8929 Other chronic pain: Secondary | ICD-10-CM

## 2014-02-27 DIAGNOSIS — M545 Low back pain: Principal | ICD-10-CM

## 2014-03-09 ENCOUNTER — Other Ambulatory Visit: Payer: Self-pay | Admitting: *Deleted

## 2014-03-09 MED ORDER — NITROGLYCERIN 0.4 MG SL SUBL: 0.4000 mg | SUBLINGUAL_TABLET | SUBLINGUAL | Status: DC | PRN

## 2014-03-11 ENCOUNTER — Ambulatory Visit
Admission: RE | Admit: 2014-03-11 | Discharge: 2014-03-11 | Disposition: A | Discharge status: Home / Self Care | Payer: Medicare Other | Source: Ambulatory Visit | Attending: Orthopedic Surgery | Admitting: Orthopedic Surgery

## 2014-03-11 VITALS — BP 116/57 | HR 59

## 2014-03-11 DIAGNOSIS — M545 Low back pain, unspecified: Secondary | ICD-10-CM

## 2014-03-11 DIAGNOSIS — G8929 Other chronic pain: Secondary | ICD-10-CM

## 2014-03-11 MED ORDER — DIAZEPAM 5 MG PO TABS
5.0000 mg | ORAL_TABLET | Freq: Once | ORAL | Status: AC
  Administered 2014-03-11: 5 mg via ORAL

## 2014-03-11 MED ORDER — IOHEXOL 180 MG/ML  SOLN
15.0000 mL | Freq: Once | INTRAMUSCULAR | Status: AC | PRN
  Administered 2014-03-11: 15 mL via INTRATHECAL

## 2014-03-11 NOTE — Discharge Instructions (Signed)
Myelogram Discharge Instructions  1. Go home and rest quietly for the next 24 hours.  It is important to lie flat for the next 24 hours.  Get up only to go to the restroom.  You may lie in the bed or on a couch on your back, your stomach, your left side or your right side.  You may have one pillow under your head.  You may have pillows between your knees while you are on your side or under your knees while you are on your back.  2. DO NOT drive today.  Recline the seat as far back as it will go, while still wearing your seat belt, on the way home.  3. You may get up to go to the bathroom as needed.  You may sit up for 10 minutes to eat.  You may resume your normal diet and medications unless otherwise indicated.  Drink lots of extra fluids today and tomorrow.  4. The incidence of headache, nausea, or vomiting is about 5% (one in 20 patients).  If you develop a headache, lie flat and drink plenty of fluids until the headache goes away.  Caffeinated beverages may be helpful.  If you develop severe nausea and vomiting or a headache that does not go away with flat bed rest, call [phone].  5. You may resume normal activities after your 24 hours of bed rest is over; however, do not exert yourself strongly or do any heavy lifting tomorrow. If when you get up you have a headache when standing, go back to bed and force fluids for another 24 hours.  6. Call your physician for a follow-up appointment.  The results of your myelogram will be sent directly to your physician by the following day.  7. If you have any questions or if complications develop after you arrive home, please call [phone].  Discharge instructions have been explained to the patient.  The patient, or the person responsible for the patient, fully understands these instructions.      May resume Eliquis today.

## 2014-03-11 NOTE — Progress Notes (Signed)
Patient states she has been off Eliquis for the past two days.

## 2014-03-25 ENCOUNTER — Telehealth: Payer: Self-pay | Admitting: Cardiology

## 2014-03-25 NOTE — Telephone Encounter (Signed)
Informed pt to send manual transmission from home on Monday. I instructed pt how to do this. She verbalized understanding.

## 2014-03-25 NOTE — Telephone Encounter (Signed)
Message copied by Tiajuana Amass on Wed Mar 25, 2014  1:24 PM ------      Message from: Delfino Lovett T      Created: Wed Mar 25, 2014 11:21 AM      Regarding: Patient would like to talk to you.        Please call Mrs. Bruso in reference to her appointment set for      Friday.             Thanks so much.  ------

## 2014-03-30 ENCOUNTER — Ambulatory Visit (INDEPENDENT_AMBULATORY_CARE_PROVIDER_SITE_OTHER): Payer: Medicare Other | Admitting: *Deleted

## 2014-03-30 ENCOUNTER — Encounter: Payer: Self-pay | Admitting: Internal Medicine

## 2014-03-30 DIAGNOSIS — I480 Paroxysmal atrial fibrillation: Secondary | ICD-10-CM

## 2014-03-30 DIAGNOSIS — I495 Sick sinus syndrome: Secondary | ICD-10-CM

## 2014-03-30 NOTE — Progress Notes (Signed)
Remote pacemaker transmission.   

## 2014-03-31 LAB — MDC_IDC_ENUM_SESS_TYPE_REMOTE
Battery Remaining Longevity: 108 mo
Battery Remaining Percentage: 91 %
Battery Voltage: 2.98 V
Brady Statistic AS VP Percent: 1 %
Brady Statistic RA Percent Paced: 99 %
Date Time Interrogation Session: 20151019075120
Implantable Pulse Generator Model: 2210
Implantable Pulse Generator Serial Number: 7438567
Lead Channel Impedance Value: 390 Ohm
Lead Channel Impedance Value: 650 Ohm
Lead Channel Pacing Threshold Amplitude: 1 V
Lead Channel Pacing Threshold Pulse Width: 0.5 ms
Lead Channel Pacing Threshold Pulse Width: 0.5 ms
Lead Channel Sensing Intrinsic Amplitude: 0.6 mV
Lead Channel Sensing Intrinsic Amplitude: 12 mV
Lead Channel Setting Pacing Amplitude: 0.875
Lead Channel Setting Pacing Pulse Width: 0.5 ms
Lead Channel Setting Sensing Sensitivity: 2 mV
MDC IDC MSMT LEADCHNL RV PACING THRESHOLD AMPLITUDE: 0.625 V
MDC IDC SET LEADCHNL RA PACING AMPLITUDE: 2 V
MDC IDC STAT BRADY AP VP PERCENT: 4.8 %
MDC IDC STAT BRADY AP VS PERCENT: 95 %
MDC IDC STAT BRADY AS VS PERCENT: 1 %
MDC IDC STAT BRADY RV PERCENT PACED: 4.8 %

## 2014-04-09 ENCOUNTER — Encounter: Payer: Self-pay | Admitting: Cardiology

## 2014-04-18 ENCOUNTER — Observation Stay (HOSPITAL_COMMUNITY)
Admission: EM | Admit: 2014-04-18 | Discharge: 2014-04-19 | Disposition: A | Discharge status: Home / Self Care | Payer: Medicare Other | Attending: Internal Medicine | Admitting: Internal Medicine

## 2014-04-18 ENCOUNTER — Encounter (HOSPITAL_COMMUNITY): Payer: Self-pay | Admitting: Emergency Medicine

## 2014-04-18 ENCOUNTER — Emergency Department (HOSPITAL_COMMUNITY): Payer: Medicare Other

## 2014-04-18 DIAGNOSIS — R0789 Other chest pain: Secondary | ICD-10-CM

## 2014-04-18 DIAGNOSIS — I4891 Unspecified atrial fibrillation: Secondary | ICD-10-CM | POA: Diagnosis present

## 2014-04-18 DIAGNOSIS — R072 Precordial pain: Secondary | ICD-10-CM

## 2014-04-18 DIAGNOSIS — I495 Sick sinus syndrome: Secondary | ICD-10-CM

## 2014-04-18 DIAGNOSIS — Z86718 Personal history of other venous thrombosis and embolism: Secondary | ICD-10-CM | POA: Diagnosis not present

## 2014-04-18 DIAGNOSIS — Z950 Presence of cardiac pacemaker: Secondary | ICD-10-CM | POA: Insufficient documentation

## 2014-04-18 DIAGNOSIS — K219 Gastro-esophageal reflux disease without esophagitis: Secondary | ICD-10-CM | POA: Diagnosis not present

## 2014-04-18 DIAGNOSIS — Z853 Personal history of malignant neoplasm of breast: Secondary | ICD-10-CM | POA: Insufficient documentation

## 2014-04-18 DIAGNOSIS — M1990 Unspecified osteoarthritis, unspecified site: Secondary | ICD-10-CM | POA: Insufficient documentation

## 2014-04-18 DIAGNOSIS — H409 Unspecified glaucoma: Secondary | ICD-10-CM | POA: Insufficient documentation

## 2014-04-18 DIAGNOSIS — I10 Essential (primary) hypertension: Secondary | ICD-10-CM | POA: Diagnosis present

## 2014-04-18 DIAGNOSIS — Z79899 Other long term (current) drug therapy: Secondary | ICD-10-CM | POA: Diagnosis not present

## 2014-04-18 DIAGNOSIS — E785 Hyperlipidemia, unspecified: Secondary | ICD-10-CM | POA: Diagnosis present

## 2014-04-18 DIAGNOSIS — R079 Chest pain, unspecified: Secondary | ICD-10-CM | POA: Diagnosis not present

## 2014-04-18 DIAGNOSIS — K5790 Diverticulosis of intestine, part unspecified, without perforation or abscess without bleeding: Secondary | ICD-10-CM | POA: Insufficient documentation

## 2014-04-18 DIAGNOSIS — I480 Paroxysmal atrial fibrillation: Secondary | ICD-10-CM | POA: Diagnosis present

## 2014-04-18 DIAGNOSIS — Z86010 Personal history of colonic polyps: Secondary | ICD-10-CM | POA: Insufficient documentation

## 2014-04-18 DIAGNOSIS — Z8701 Personal history of pneumonia (recurrent): Secondary | ICD-10-CM | POA: Insufficient documentation

## 2014-04-18 DIAGNOSIS — K589 Irritable bowel syndrome without diarrhea: Secondary | ICD-10-CM | POA: Insufficient documentation

## 2014-04-18 DIAGNOSIS — I4892 Unspecified atrial flutter: Secondary | ICD-10-CM | POA: Insufficient documentation

## 2014-04-18 DIAGNOSIS — M810 Age-related osteoporosis without current pathological fracture: Secondary | ICD-10-CM | POA: Insufficient documentation

## 2014-04-18 DIAGNOSIS — I868 Varicose veins of other specified sites: Secondary | ICD-10-CM | POA: Diagnosis not present

## 2014-04-18 LAB — CBC WITH DIFFERENTIAL/PLATELET
BASOS ABS: 0 10*3/uL (ref 0.0–0.1)
Basophils Relative: 0 % (ref 0–1)
EOS PCT: 1 % (ref 0–5)
Eosinophils Absolute: 0.1 10*3/uL (ref 0.0–0.7)
HCT: 32.1 % — ABNORMAL LOW (ref 36.0–46.0)
HEMOGLOBIN: 10.7 g/dL — AB (ref 12.0–15.0)
LYMPHS ABS: 1.2 10*3/uL (ref 0.7–4.0)
Lymphocytes Relative: 29 % (ref 12–46)
MCH: 32.4 pg (ref 26.0–34.0)
MCHC: 33.3 g/dL (ref 30.0–36.0)
MCV: 97.3 fL (ref 78.0–100.0)
MONO ABS: 0.5 10*3/uL (ref 0.1–1.0)
MONOS PCT: 11 % (ref 3–12)
NEUTROS ABS: 2.6 10*3/uL (ref 1.7–7.7)
Neutrophils Relative %: 59 % (ref 43–77)
Platelets: 229 10*3/uL (ref 150–400)
RBC: 3.3 MIL/uL — AB (ref 3.87–5.11)
RDW: 13.8 % (ref 11.5–15.5)
WBC: 4.3 10*3/uL (ref 4.0–10.5)

## 2014-04-18 LAB — I-STAT TROPONIN, ED: Troponin i, poc: 0.01 ng/mL (ref 0.00–0.08)

## 2014-04-18 LAB — COMPREHENSIVE METABOLIC PANEL
ALBUMIN: 3 g/dL — AB (ref 3.5–5.2)
ALT: 11 U/L (ref 0–35)
ANION GAP: 13 (ref 5–15)
AST: 17 U/L (ref 0–37)
Alkaline Phosphatase: 50 U/L (ref 39–117)
BUN: 11 mg/dL (ref 6–23)
CHLORIDE: 103 meq/L (ref 96–112)
CO2: 25 mEq/L (ref 19–32)
Calcium: 8.9 mg/dL (ref 8.4–10.5)
Creatinine, Ser: 0.61 mg/dL (ref 0.50–1.10)
GFR calc Af Amer: >90 mL/min (ref >90)
GFR calc non Af Amer: 82 mL/min — ABNORMAL LOW (ref >90)
Glucose, Bld: 107 mg/dL — ABNORMAL HIGH (ref 70–99)
POTASSIUM: 4.7 meq/L (ref 3.7–5.3)
SODIUM: 141 meq/L (ref 137–147)
TOTAL PROTEIN: 6.5 g/dL (ref 6.0–8.3)
Total Bilirubin: 0.4 mg/dL (ref 0.3–1.2)

## 2014-04-18 LAB — TROPONIN I: Troponin I: <0.3 ng/mL (ref <0.30)

## 2014-04-18 LAB — PRO B NATRIURETIC PEPTIDE: PRO B NATRI PEPTIDE: 971.7 pg/mL — AB (ref 0–450)

## 2014-04-18 LAB — LIPASE, BLOOD: Lipase: 36 U/L (ref 11–59)

## 2014-04-18 MED ORDER — CALCIUM CARBONATE-VITAMIN D 500-200 MG-UNIT PO TABS
2.0000 | ORAL_TABLET | Freq: Every day | ORAL | Status: DC
  Administered 2014-04-18 – 2014-04-19 (×2): 2 via ORAL
  Filled 2014-04-18 (×2): qty 2

## 2014-04-18 MED ORDER — NADOLOL 40 MG PO TABS
40.0000 mg | ORAL_TABLET | Freq: Every evening | ORAL | Status: DC
  Filled 2014-04-18: qty 1

## 2014-04-18 MED ORDER — ACETAMINOPHEN 325 MG PO TABS
650.0000 mg | ORAL_TABLET | ORAL | Status: DC | PRN
  Administered 2014-04-19: 650 mg via ORAL

## 2014-04-18 MED ORDER — ADULT MULTIVITAMIN W/MINERALS CH
1.0000 | ORAL_TABLET | Freq: Every day | ORAL | Status: DC
  Administered 2014-04-18 – 2014-04-19 (×2): 1 via ORAL
  Filled 2014-04-18 (×4): qty 1

## 2014-04-18 MED ORDER — APIXABAN 5 MG PO TABS
5.0000 mg | ORAL_TABLET | Freq: Two times a day (BID) | ORAL | Status: DC
  Administered 2014-04-18 – 2014-04-19 (×2): 5 mg via ORAL
  Filled 2014-04-18 (×2): qty 1

## 2014-04-18 MED ORDER — NITROGLYCERIN 0.4 MG SL SUBL
0.4000 mg | SUBLINGUAL_TABLET | SUBLINGUAL | Status: DC | PRN
  Administered 2014-04-18: 0.4 mg via SUBLINGUAL
  Filled 2014-04-18: qty 1

## 2014-04-18 MED ORDER — ONE-DAILY MULTI VITAMINS PO TABS: 1.0000 | ORAL_TABLET | Freq: Every day | ORAL | Status: DC

## 2014-04-18 MED ORDER — AMIODARONE HCL 100 MG PO TABS
100.0000 mg | ORAL_TABLET | Freq: Every day | ORAL | Status: DC
  Administered 2014-04-19: 100 mg via ORAL
  Filled 2014-04-18: qty 1

## 2014-04-18 MED ORDER — ONDANSETRON HCL 4 MG/2ML IJ SOLN
4.0000 mg | Freq: Four times a day (QID) | INTRAMUSCULAR | Status: DC | PRN
Start: 2014-04-18 — End: 2014-04-19

## 2014-04-18 MED ORDER — LATANOPROST 0.005 % OP SOLN
1.0000 [drp] | Freq: Every day | OPHTHALMIC | Status: DC
  Administered 2014-04-18: 1 [drp] via OPHTHALMIC
  Filled 2014-04-18 (×2): qty 2.5

## 2014-04-18 MED ORDER — SODIUM CHLORIDE 0.9 % IV BOLUS (SEPSIS)
500.0000 mL | Freq: Once | INTRAVENOUS | Status: AC
  Administered 2014-04-18: 500 mL via INTRAVENOUS

## 2014-04-18 MED ORDER — APIXABAN 5 MG PO TABS: 5.0000 mg | ORAL_TABLET | Freq: Two times a day (BID) | ORAL | Status: DC

## 2014-04-18 NOTE — ED Notes (Signed)
Per EMS, pt began having central chest pain this morning at 0400. Pt has a AICD/pacer at right clavicle. Pt took 2 nitros before EMS arrival without any relief. EMS gave 1 nitro, 324 aspirin and 250 cc bolus NS. Pt reported relief with EMS interventions, however 5 min out started complaining of chest pressure 8/10 again. Pt has 18 g LAC. EMS placed pt on 4 L o2 due to pt complaint of chest pain. Pt denies SOB, nausea, vomiting, dizziness, and radiation anywhere.

## 2014-04-18 NOTE — ED Notes (Addendum)
MD at bedside. Family at bedside. Per family, family has experienced a loss of a niece and pt has been upset over that. Pt has very significant cardiac history.

## 2014-04-18 NOTE — ED Provider Notes (Signed)
TIME SEEN: 10:30 AM  CHIEF COMPLAINT: chest pain  HPI: Pt is a 80 y.o. F with history of hypertension, hyperlipidemia, paroxysmal atrial fibrillation on Eliquis, sick sinus syndrome status post pacemaker placement in 2003, breast cancer in the 1980s, prior DVT in the 1990s, prior negative cardiac catheterization 1999, recent negative lexi scan on 08/01/12 who presents the emergency department with substernal chest pressure that started at 4 AM. She states she thought that this may have been indigestion and took Tum's without relief. No radiation of pain. No aggravating or relieving factors. She does have some mild shortness of breath. No nausea, vomiting, diaphoresis, dizziness. No abdominal pain. No fevers, cough. No lower extremity swelling or pain.  No prior history of MI.  Patient lives at home alone.  She took nitroglycerin prior to EMS arrival without relief. She was given aspirin and nitroglycerin with EMS reports this helped with her chest pain but is not completely gone.  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 Arthritis   . Glaucoma   . Diverticulosis   . GERD (gastroesophageal reflux disease)   . Mixed hyperlipidemia   . Essential hypertension, benign   . Colon polyps   . Paroxysmal atrial fibrillation   . Atrial flutter     s/p RFA 2003  . Pneumonia     7/03, with encephalopathy  . Sick sinus syndrome     s/p PPM (SJM)  . Varicose veins   . DVT, lower extremity   . IBS (irritable bowel syndrome)   . Osteoporosis   . Breast cancer     1980's - lumpectomy only  . Sleep apnea     STOP BANG score 5     MEDICATIONS:  Prior to Admission medications   Medication Sig Start Date End Date Taking? Authorizing Provider  amiodarone (PACERONE) 200 MG tablet TAKE 1/2 TABLET (100MG ) BY MOUTH ONCE A DAY EVERY EVENING 09/08/13   Thompson Grayer, MD  apixaban (ELIQUIS) 5 MG TABS tablet Take 1 tablet (5 mg total) by mouth 2 (two) times daily. 02/17/14   Satira Sark, MD  bimatoprost (LUMIGAN) 0.01 % SOLN Place 1 drop into both eyes at bedtime.    Historical Provider, MD  calcium-vitamin D (OSCAL WITH D) 500-200 MG-UNIT per tablet Take 2 tablets by mouth daily.      Historical Provider, MD  Coenzyme Q10 300 MG CAPS Take 1 capsule by mouth daily.    Historical Provider, MD  cyanocobalamin (,VITAMIN B-12,) 1000 MCG/ML injection Inject 1,000 mcg into the muscle every 30 (thirty) days.      Historical Provider, MD  ezetimibe (ZETIA) 10 MG tablet Take 10 mg by mouth daily.    Historical Provider, MD  Glucosamine-Chondroit-Vit C-Mn (GLUCOSAMINE CHONDR 1500 COMPLX PO) Take 2 by mouth daily      Historical Provider, MD  Multiple Vitamin (MULTIVITAMIN) tablet Take 1 tablet by mouth daily.      Historical Provider, MD  nadolol (CORGARD) 40 MG tablet TAKE 1 TABLET BY MOUTH ONCE A DAY WITH BREAKFAST    Thompson Grayer, MD  nitroGLYCERIN (NITROSTAT) 0.4 MG SL tablet  Place 1 tablet (0.4 mg total) under the tongue every 5 (five) minutes as needed for chest pain. 03/09/14   Satira Sark, MD  pravastatin (PRAVACHOL) 20 MG tablet TAKE ONE TABLET BY MOUTH ONE TIME DAILY 10/21/13   Historical Provider, MD    ALLERGIES:  Allergies  Allergen Reactions  . Iodinated Diagnostic Agents Hives, Itching and Rash    03/13/14: symptoms began within two hours of intrathecal injection (myelogram 03/11/14); on shoulders radiating down to stomach.  Relief with Benadryl.  Told patient she would need to pre-med with Benadryl in the future before receiving this contrast.  Brita Romp, RN  . Naproxen Hives, Itching and Rash    SOCIAL  HISTORY:  History  Substance Use Topics  . Smoking status: Never Smoker   . Smokeless tobacco: Never Used  . Alcohol Use: No    FAMILY HISTORY: Family History  Problem Relation Age of Onset  . Colon cancer Brother   . Colon cancer Other     Grandfather  . Cancer Father   . Stroke Mother   . Heart attack      CHF grandmother    EXAM: SpO2 98% CONSTITUTIONAL: Alert and oriented and responds appropriately to questions. Well-appearing; well-nourished HEAD: Normocephalic EYES: Conjunctivae clear, PERRL ENT: normal nose; no rhinorrhea; moist mucous membranes; pharynx without lesions noted NECK: Supple, no meningismus, no LAD  CARD: RRR; S1 and S2 appreciated; no murmurs, no clicks, no rubs, no gallops RESP: Normal chest excursion without splinting or tachypnea; breath sounds clear and equal bilaterally; no wheezes, no rhonchi, no rales,  ABD/GI: Normal bowel sounds; non-distended; soft, non-tender, no rebound, no guarding BACK:  The back appears normal and is non-tender to palpation, there is no CVA tenderness EXT: Normal ROM in all joints; non-tender to palpation; no edema; normal capillary refill; no cyanosis    SKIN: Normal color for age and race; warm NEURO: Moves all extremities equally PSYCH: The patient's mood and manner are appropriate. Grooming and personal hygiene are appropriate.  MEDICAL DECISION MAKING: Pt here with chest pain with multiple risk factors for ACS. Pain improved with nitroglycerin. We'll give nitroglycerin in the ED to get the pt CP free. Her EKG shows no significant changes since 2014. She does have T-wave inversions in her anterior leads, slightly worsened and V2 compared to prior. We'll obtain cardiac labs, chest x-ray. Anticipate admission.  ED PROGRESS: 12:30 PM  Pt's pain is completely gone after NTG.  Patient's troponin is negative. Chest x-ray clear. We'll discuss with medicine for admission for observation for ACS rule out.  1:17 PM  Spoke with  Dr. Cruzita Lederer for admission to tele, obs.   EKG Interpretation  Date/Time:  Saturday April 18 2014 10:17:22 EST Ventricular Rate:  59 PR Interval:  141 QRS Duration: 123 QT Interval:  422 QTC Calculation: 418 R Axis:   -13 Text Interpretation:  Age not entered, assumed to be  80 years old for purpose of ECG interpretation Sinus rhythm Atrial premature complex IVCD, consider atypical RBBB Probable anterior infarct, age indeterminate No significant change since last tracing in 2014 Confirmed by Maguayo,  DO, Michol Emory [phone]) on 04/18/2014 10:21:46 AM          [place], DO 04/18/14 1318

## 2014-04-18 NOTE — ED Notes (Signed)
Pt reports relief of chest pressure with 1 nitro. Pt reports minor tightness, but states "it doesn't hurt, it just feels tight"

## 2014-04-18 NOTE — ED Notes (Signed)
Pt pressure dropped to 99/56 after 1 nitro. Will hold off on next dose until pressure comes up.

## 2014-04-18 NOTE — ED Notes (Signed)
Admitting MD at bedside.

## 2014-04-18 NOTE — ED Notes (Signed)
Attempted to give report to [facility]. Unable to take report. Nurse will return call when able to.

## 2014-04-18 NOTE — ED Notes (Signed)
Ordered meal tray for pt to be delivered to [facility]

## 2014-04-18 NOTE — ED Notes (Signed)
Pt back from x-ray.

## 2014-04-18 NOTE — ED Notes (Signed)
Pt gone to xray

## 2014-04-18 NOTE — H&P (Signed)
History and Physical    [name] QMG:[id number] DOB: 06-16-1928 DOA: 04/18/2014  Referring physician: Dr. Leonides Schanz PCP: Sherrie Mustache, MD  Specialists: cardiology  Chief Complaint: chest pain  HPI: [name] is a 80 y.o. female has a past medical history significant for hypertension, hyperlipidemia, sick sinus syndrome status post pacemaker about 12 years ago with a revision about 2 years ago, paroxysmal atrial fibrillation on amiodarone as well as anticoagulation with apixaban, presents to the emergency room with a chief complaint of chest pain. Patient woke up around 4 AM this morning and she experienced substernal pressure-like chest pain. She initially thought it was heartburn and took a Tums without relief. She does get occasional heartburn however she usually gets burning with heartburn. This pain was very different and never experienced anything similar in the past. She took 2 nitroglycerin without relief, and then decided to come to the emergency room. Her chest pain eventually subsided up to 4 nitroglycerin, and in all lasted about 4-5 hours. She denies any shortness of breath, denies any radiation of her chest pain, she denies any pain with inspiration or with movement. She has no cough or chest congestion. She denies any fever or chills. She denies any abdominal pain nausea vomiting or diarrhea. She is endorsing mild weakness with activity however this is chronic. She underwent a stress test in February 2014 which was negative. In the emergency room, her vital signs are within normal limits, and blood work remarkable for mild anemia with a hemoglobin of 10.7 and mild elevation of her BNP 971. Initial troponin point of care was negative. EKG showed T-wave inversions in anterior leads, similar to her prior EKGs. She is currently chest pain-free, and [facility] ask for admission for chest pain.  Review of Systems: as per history of present illness, otherwise negative  Past  Medical History  Diagnosis Date  . Arthritis   . Glaucoma   . Diverticulosis   . GERD (gastroesophageal reflux disease)   . Mixed hyperlipidemia   . Essential hypertension, benign   . Colon polyps   . Paroxysmal atrial fibrillation   . Atrial flutter     s/p RFA 2003  . Pneumonia     7/03, with encephalopathy  . Sick sinus syndrome     s/p PPM (SJM)  . Varicose veins   . DVT, lower extremity   . IBS (irritable bowel syndrome)   . Osteoporosis   . Breast cancer     1980's - lumpectomy only  . Sleep apnea     STOP BANG score 5   Past Surgical History  Procedure Laterality Date  . Cholecystectomy    . Pacemaker insertion  2003  . Breast lumpectomy      Right breast   . Colonoscopy w/ biopsies and polypectomy  03/2003, 05/2008, 02/20/2011    severe diverticulosis, tubulovillous adenoma polyp, internal and external hemorrhoids 2012: severe diverticulosis, 4 mm polyp, hemorrhoids  . Cataracts    . Fractured wrist      repair  . Total knee arthroplasty  12/20/2011    Procedure: TOTAL KNEE ARTHROPLASTY;  Surgeon: Tobi Bastos, MD;  Location: [facility];  Service: Orthopedics;  Laterality: Left;  . Pacemaker generator change  06/18/12    SJM [facility] DR RF, Dr Rayann Heman   Social History:  reports that she has never smoked. She has never used smokeless tobacco. She reports that she does not drink alcohol or use illicit drugs.  Allergies  Allergen Reactions  .  Iodinated Diagnostic Agents Hives, Itching and Rash    03/13/14: symptoms began within two hours of intrathecal injection (myelogram 03/11/14); on shoulders radiating down to stomach.  Relief with Benadryl.  Told patient she would need to pre-med with Benadryl in the future before receiving this contrast.  Brita Romp, RN  . Naproxen Hives, Itching and Rash    Family History  Problem Relation Age of Onset  . Colon cancer Brother   . Colon cancer Other     Grandfather  . Cancer Father   . Stroke Mother   . Heart attack       CHF grandmother    Prior to Admission medications   Medication Sig Start Date End Date Taking? Authorizing Provider  amiodarone (PACERONE) 200 MG tablet TAKE 1/2 TABLET (100MG ) BY MOUTH ONCE A DAY EVERY EVENING 09/08/13  Yes Thompson Grayer, MD  apixaban (ELIQUIS) 5 MG TABS tablet Take 1 tablet (5 mg total) by mouth 2 (two) times daily. 02/17/14  Yes Satira Sark, MD  bimatoprost (LUMIGAN) 0.01 % SOLN Place 1 drop into both eyes at bedtime.   Yes Historical Provider, MD  calcium-vitamin D (OSCAL WITH D) 500-200 MG-UNIT per tablet Take 2 tablets by mouth daily.     Yes Historical Provider, MD  cyanocobalamin (,VITAMIN B-12,) 1000 MCG/ML injection Inject 1,000 mcg into the muscle every 30 (thirty) days.     Yes Historical Provider, MD  Multiple Vitamin (MULTIVITAMIN) tablet Take 1 tablet by mouth daily.     Yes Historical Provider, MD  nadolol (CORGARD) 40 MG tablet Take 40 mg by mouth every evening.   Yes Historical Provider, MD  nitroGLYCERIN (NITROSTAT) 0.4 MG SL tablet Place 1 tablet (0.4 mg total) under the tongue every 5 (five) minutes as needed for chest pain. 03/09/14  Yes Satira Sark, MD  nadolol (CORGARD) 40 MG tablet TAKE 1 TABLET BY MOUTH ONCE A DAY WITH BREAKFAST    Thompson Grayer, MD   Physical Exam: Filed Vitals:   04/18/14 1315 04/18/14 1330 04/18/14 1345 04/18/14 1400  BP: 103/60 124/55 124/63 121/62  Pulse: 60 59 59 61  Resp: 15  13 14   Height:      Weight:      SpO2: 98% 100% 97% 98%     General:  No apparent distress, pleasant Caucasian female  Eyes: no scleral icterus  ENT: moist oropharynx  Neck: supple, no JVD  Cardiovascular: regular rate without MRG; 2+ peripheral pulses  Respiratory: CTA biL, good air movement without wheezing, or crackles  Abdomen: soft, non tender to palpation  Skin: no rashes  Musculoskeletal: no peripheral edema  Psychiatric: normal mood and affect  Neurologic: grossly nonfocal  Labs on Admission:  Basic Metabolic  Panel:  Recent Labs Lab 04/18/14 1035  NA 141  K 4.7  CL 103  CO2 25  GLUCOSE 107*  BUN 11  CREATININE 0.61  CALCIUM 8.9   Liver Function Tests:  Recent Labs Lab 04/18/14 1035  AST 17  ALT 11  ALKPHOS 50  BILITOT 0.4  PROT 6.5  ALBUMIN 3.0*    Recent Labs Lab 04/18/14 1035  LIPASE 36   No results for input(s): AMMONIA in the last 168 hours. CBC:  Recent Labs Lab 04/18/14 1035  WBC 4.3  NEUTROABS 2.6  HGB 10.7*  HCT 32.1*  MCV 97.3  PLT 229   Cardiac Enzymes: No results for input(s): CKTOTAL, CKMB, CKMBINDEX, TROPONINI in the last 168 hours.  BNP (last 3 results)  Recent  Labs  04/18/14 1035  PROBNP 971.7*   CBG: No results for input(s): GLUCAP in the last 168 hours.  Radiological Exams on Admission: Dg Chest 2 View  04/18/2014   CLINICAL DATA:  80 year old female with chest pain  EXAM: CHEST  2 VIEW  COMPARISON:  Prior chest x-ray and rib series 511 2015  FINDINGS: Stable position of right subclavian approach cardiac rhythm maintenance device with leads projecting over the right atrium and right ventricle. Borderline cardiomegaly remains stable. No evidence of focal airspace consolidation, pleural effusion, pneumothorax or pulmonary edema. Stable mild central bronchitic change. Degenerative spurring throughout the thoracic spine. Stable mild mid thoracic compression fracture with associated exaggerated kyphosis. Surgical clips in the right upper quadrant suggest prior cholecystectomy.  IMPRESSION: Stable chest x-ray without evidence of acute cardiopulmonary process.   Electronically Signed   By: Jacqulynn Cadet M.D.   On: 04/18/2014 12:30    EKG: Independently reviewed.  Assessment/Plan Principal Problem:   Chest pain Active Problems:   Hyperlipidemia   Essential hypertension, benign   Paroxysmal atrial fibrillation   Sick sinus syndrome   A-fib    Chest pain - patient with somewhat of a typical presentation, will admit for chest pain  observation, will consult cardiology for evaluation for stress test, we'll cycle cardiac enzymes overnight, and monitor on telemetry.  Paroxysmal atrial fibrillation - continue amiodarone and apixaban  SSS - status post pacemaker, monitor on telemetry  Hyperlipidemia - appears not to be on any medications currently. Per previous notes, she had some muscle stiffness with Pravachol.    Diet: heart healthy, nothing by mouth after midnight Fluids: none DVT Prophylaxis: apixaban  Code Status: full code  Family Communication: discussed with family at bedside  Disposition Plan: admit to chest pain observation  Time spent: 76  Ralph Brouwer M. Cruzita Lederer, MD [facility] Pager [phone]  If 7PM-7AM, please contact night-coverage [email or web address] Password [email or web address] 04/18/2014, 2:32 PM

## 2014-04-18 NOTE — Consult Note (Signed)
CARDIOLOGY CONSULT NOTE   Patient ID: [name] MRN: [id number] DOB/AGE: [birth year] 78 y.o.  Admit date: 04/18/2014  Primary Physician   Sherrie Mustache, MD Primary Cardiologist   Dr. Domenic Polite Reason for Consultation   Chest pain  [id number] [name] is a 80 y.o. female with no history of CAD.  She has a history of PAF, on amiodarone and pelvic was. She also has a history of sick sinus syndrome and has a Corporate investment banker. She had some episodes of chest pain in February 2014, but a Lexi scan Myoview at that time was negative for ischemia with LVEF 63%.  She has been doing well, in her usual state of health. She has not been having palpitations. Her dyspnea on exertion is at baseline. 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 She also attends Silver sneakers several times a week without difficulty, most recently yesterday.  She frequently wakes during the night between 2 and 4 AM. Today at 4 AM, she may have been awake when she had onset of substernal chest pain that radiated across her whole chest. It reached an 8/10 at its worst. She felt like she was having trouble getting a deep breath. There was no nausea, vomiting or diaphoresis. She does not remember having this before. She took Tums without any change in her symptoms. She would lie back and rest, then get up and walk around but her symptoms did not change at all. She took a sublingual nitroglycerin which she could taste but it did not give her headache and her symptoms did not change. Finally, she called EMS. EMS gave her sublingual nitroglycerin which decreased her pain and she got 2 more nitroglycerin in the emergency room. Currently her pain is a 0/10.   Past Medical History  Diagnosis Date  . Arthritis   . Glaucoma   . Diverticulosis   . GERD (gastroesophageal reflux disease)   . Mixed hyperlipidemia   . Essential hypertension, benign   . Colon  polyps   . Paroxysmal atrial fibrillation   . Atrial flutter     s/p RFA 2003  . Pneumonia     7/03, with encephalopathy  . Sick sinus syndrome     s/p PPM (SJM)  . Varicose veins   . DVT, lower extremity   . IBS (irritable bowel syndrome)   . Osteoporosis   . Breast cancer     1980's - lumpectomy only  . Sleep apnea     STOP BANG score 5     Past Surgical History  Procedure Laterality Date  . Cholecystectomy    . Pacemaker insertion  2003  . Breast lumpectomy      Right breast   . Colonoscopy w/ biopsies and polypectomy  03/2003, 05/2008, 02/20/2011    severe diverticulosis, tubulovillous adenoma polyp, internal and external hemorrhoids 2012: severe diverticulosis, 4 mm polyp, hemorrhoids  . Cataracts    . Fractured wrist      repair  . Total knee arthroplasty  12/20/2011    Procedure: TOTAL KNEE ARTHROPLASTY;  Surgeon: Tobi Bastos, MD;  Location: [facility];  Service: Orthopedics;  Laterality: Left;  . Pacemaker generator change  06/18/12    SJM [facility] DR RF, Dr Rayann Heman  . Cardiac catheterization  1999    Normal coronaries    Allergies  Allergen Reactions  . Iodinated Diagnostic Agents Hives, Itching and Rash    03/13/14: symptoms began  within two hours of intrathecal injection (myelogram 03/11/14); on shoulders radiating down to stomach.  Relief with Benadryl.  Told patient she would need to pre-med with Benadryl in the future before receiving this contrast.  Brita Romp, RN  . Naproxen Hives, Itching and Rash    I have reviewed the patient's current medications acetaminophen, nitroGLYCERIN, ondansetron (ZOFRAN) IV  Prior to Admission medications   Medication Sig Start Date End Date Taking? Authorizing Provider  amiodarone (PACERONE) 200 MG tablet TAKE 1/2 TABLET (100MG ) BY MOUTH ONCE A DAY EVERY EVENING 09/08/13  Yes Thompson Grayer, MD  apixaban (ELIQUIS) 5 MG TABS tablet Take 1 tablet (5 mg total) by mouth 2 (two) times daily. 02/17/14  Yes Satira Sark, MD    bimatoprost (LUMIGAN) 0.01 % SOLN Place 1 drop into both eyes at bedtime.   Yes Historical Provider, MD  calcium-vitamin D (OSCAL WITH D) 500-200 MG-UNIT per tablet Take 2 tablets by mouth daily.     Yes Historical Provider, MD  cyanocobalamin (,VITAMIN [name]-12,) 1000 MCG/ML injection Inject 1,000 mcg into the muscle every 30 (thirty) days.     Yes Historical Provider, MD  Multiple Vitamin (MULTIVITAMIN) tablet Take 1 tablet by mouth daily.     Yes Historical Provider, MD  nadolol (CORGARD) 40 MG tablet Take 40 mg by mouth every evening.   Yes Historical Provider, MD  nitroGLYCERIN (NITROSTAT) 0.4 MG SL tablet Place 1 tablet (0.4 mg total) under the tongue every 5 (five) minutes as needed for chest pain. 03/09/14  Yes Satira Sark, MD  nadolol (CORGARD) 40 MG tablet TAKE 1 TABLET BY MOUTH ONCE A DAY WITH BREAKFAST    Thompson Grayer, MD     History   Social History  . Marital Status: Widowed    Spouse Name: N/A    Number of Children: N/A  . Years of Education: N/A   Occupational History  . Retired    Social History Main Topics  . Smoking status: Never Smoker   . Smokeless tobacco: Never Used  . Alcohol Use: No  . Drug Use: No  . Sexual Activity: Not on file   Other Topics Concern  . Not on file   Social History Narrative    Family Status  Relation Status Death Age  . Mother Deceased   . Father Deceased    Family History  Problem Relation Age of Onset  . Colon cancer Brother   . Colon cancer Other     Grandfather  . Cancer Father   . Stroke Mother   . Heart attack      CHF grandmother     ROS: She has no recent illnesses, fevers or chills. She has been doing well with Silver sneakers and is able to take in the yard and do planting without difficulty. She has some problems with arthritis in her knee and got a steroid injection about a month ago which helped. Full 14 point review of systems complete and found to be negative unless listed above.  Physical Exam: Blood  pressure 123/60, pulse 58, resp. rate 16, height 5\' 6"  (1.676 m), weight 188 lb (85.276 kg), SpO2 99 %.  General: Well developed, well nourished, elderly female in no acute distress Head: Eyes PERRLA, No xanthomas.   Normocephalic and atraumatic, oropharynx without edema or exudate. Dentition: poor  Lungs: clear bilaterally Heart: HRRR S1 S2, no rub/gallop, no significant murmur. pulses are 2+ all 4 extrem.   Neck: No carotid bruits. No lymphadenopathy.  JVD  not elevated. Abdomen: Bowel sounds present, abdomen soft and non-tender without masses or hernias noted. Msk:  No spine or cva tenderness. No weakness, no joint deformities or effusions. Extremities: No clubbing or cyanosis. no edema.  Neuro: Alert and oriented X 3. No focal deficits noted. Psych:  Good affect, responds appropriately Skin: No rashes or lesions noted.  Labs:   Lab Results  Component Value Date   WBC 4.3 04/18/2014   HGB 10.7* 04/18/2014   HCT 32.1* 04/18/2014   MCV 97.3 04/18/2014   PLT 229 04/18/2014     Recent Labs Lab 04/18/14 1035  NA 141  K 4.7  CL 103  CO2 25  BUN 11  CREATININE 0.61  CALCIUM 8.9  PROT 6.5  BILITOT 0.4  ALKPHOS 50  ALT 11  AST 17  GLUCOSE 107*  ALBUMIN 3.0*    Recent Labs  04/18/14 1045  TROPIPOC 0.01   PRO [name] NATRIURETIC PEPTIDE (BNP)  Date/Time Value Ref Range Status  04/18/2014 10:35 AM 971.7* 0 - 450 pg/mL Final  12/07/2008 02:15 PM 420.0* 0.0 - 100.0 pg/mL Final   LIPASE  Date/Time Value Ref Range Status  04/18/2014 10:35 AM 36 11 - 59 U/L Final    Echo: 2003 EF normal, no wall motion abnormalities  ECG:  04/18/2014 Atrial pacing, ventricular sensing, possible lead placement abnormality. Repeat ECG pending  Radiology:  Dg Chest 2 View 04/18/2014   CLINICAL DATA:  80 year old female with chest pain  EXAM: CHEST  2 VIEW  COMPARISON:  Prior chest x-ray and rib series 511 2015  FINDINGS: Stable position of right subclavian approach cardiac rhythm maintenance  device with leads projecting over the right atrium and right ventricle. Borderline cardiomegaly remains stable. No evidence of focal airspace consolidation, pleural effusion, pneumothorax or pulmonary edema. Stable mild central bronchitic change. Degenerative spurring throughout the thoracic spine. Stable mild mid thoracic compression fracture with associated exaggerated kyphosis. Surgical clips in the right upper quadrant suggest prior cholecystectomy.  IMPRESSION: Stable chest x-ray without evidence of acute cardiopulmonary process.   Electronically Signed   By: Jacqulynn Cadet M.D.   On: 04/18/2014 12:30    ASSESSMENT AND PLAN:   The patient was seen today by Dr. Domenic Polite, the patient evaluated and the data reviewed.   Principal Problem:   Chest pain  - symptoms started at rest and had no exertional component but were relieved by sublingual nitroglycerin. She has multiple cardiac risk factors. Agree with admission and continuing to cycle cardiac enzymes. We'll check troponin every 6 hours 3. Last Myoview was a year ago and was without scar or ischemia. Discussed the risks and benefits of cardiac catheterization with the patient and advised her that the final decision on stress testing versus heart catheterization would be up to Dr. Domenic Polite.  Active Problems:   Hyperlipidemia - per IM, would continue Zetia    Essential hypertension, benign - per IM, good control on current rx    Paroxysmal atrial fibrillation - currently a-pacing, she is compliant with amiodarone and Eliquis, continue these medications.    Sick sinus syndrome - St. Jude pacemaker is functioning well  Signed: Rosaria Ferries, PA-C 04/18/2014 3:20 PM Beeper [phone]  Co-Sign MD    Attending note:  Patient seen and examined. Reviewed records and discussed the case with Ms. Ahmed Prima PA-C. [name] is well-known to me,last seen in the office back in July, and doing well at that point. She has a history of PAF,  currently on Eliquis and amiodarone.  She has no clearly documented history of obstructive CAD or myocardial infarction, negative Cardiolite a year and a half ago. She presents describing the onset of a chest heaviness around 4 AM this morning when she got up per usual. She thought that it might be indigestion, but antacids did not help, she moved around, tried to change positions, symptoms did not abate. Ultimately she was transported via EMS and over the course of receiving four nitroglycerin tablets, her symptoms resolved. ECG shows an atrial paced rhythm with nonspecific ST-T changes, somewhat less prominent in the anterior leads compared to prior tracing. She is currently pain-free. Initial troponin I level is negative. She is being admitted for further observation on the hospitalist team. Would cycle cardiac markers, continue present medications, follow-up ECG in the morning. Most likely plan will be to proceed with a Lexiscan Cardiolite tomorrow for follow-up ischemic testing, although if she has recurrent chest pain at rest under observation, or her cardiac markers are abnormal, cardiac catheterization could be discussed.  Satira Sark, M.D., F.A.C.C.

## 2014-04-18 NOTE — ED Notes (Signed)
Attempted to call report again and nurse was unable to take report. RN states will return call in a few minutes.

## 2014-04-18 NOTE — ED Notes (Signed)
Report given to [address] [employer].

## 2014-04-19 ENCOUNTER — Observation Stay (HOSPITAL_COMMUNITY): Payer: Medicare Other

## 2014-04-19 DIAGNOSIS — I495 Sick sinus syndrome: Secondary | ICD-10-CM | POA: Diagnosis not present

## 2014-04-19 DIAGNOSIS — R072 Precordial pain: Secondary | ICD-10-CM | POA: Diagnosis not present

## 2014-04-19 DIAGNOSIS — I10 Essential (primary) hypertension: Secondary | ICD-10-CM

## 2014-04-19 DIAGNOSIS — R079 Chest pain, unspecified: Principal | ICD-10-CM

## 2014-04-19 DIAGNOSIS — I480 Paroxysmal atrial fibrillation: Secondary | ICD-10-CM

## 2014-04-19 DIAGNOSIS — M1990 Unspecified osteoarthritis, unspecified site: Secondary | ICD-10-CM | POA: Diagnosis not present

## 2014-04-19 DIAGNOSIS — E785 Hyperlipidemia, unspecified: Secondary | ICD-10-CM | POA: Diagnosis not present

## 2014-04-19 DIAGNOSIS — H409 Unspecified glaucoma: Secondary | ICD-10-CM | POA: Diagnosis not present

## 2014-04-19 LAB — COMPREHENSIVE METABOLIC PANEL
ALT: 8 U/L (ref 0–35)
AST: 11 U/L (ref 0–37)
Albumin: 2.7 g/dL — ABNORMAL LOW (ref 3.5–5.2)
Alkaline Phosphatase: 51 U/L (ref 39–117)
Anion gap: 12 (ref 5–15)
BUN: 9 mg/dL (ref 6–23)
CALCIUM: 9.3 mg/dL (ref 8.4–10.5)
CO2: 25 meq/L (ref 19–32)
Chloride: 102 mEq/L (ref 96–112)
Creatinine, Ser: 0.64 mg/dL (ref 0.50–1.10)
GFR calc Af Amer: >90 mL/min (ref >90)
GFR calc non Af Amer: 81 mL/min — ABNORMAL LOW (ref >90)
Glucose, Bld: 102 mg/dL — ABNORMAL HIGH (ref 70–99)
Potassium: 4.2 mEq/L (ref 3.7–5.3)
SODIUM: 139 meq/L (ref 137–147)
TOTAL PROTEIN: 6.2 g/dL (ref 6.0–8.3)
Total Bilirubin: 0.5 mg/dL (ref 0.3–1.2)

## 2014-04-19 LAB — CBC
HCT: 31.4 % — ABNORMAL LOW (ref 36.0–46.0)
Hemoglobin: 10.5 g/dL — ABNORMAL LOW (ref 12.0–15.0)
MCH: 32.8 pg (ref 26.0–34.0)
MCHC: 33.4 g/dL (ref 30.0–36.0)
MCV: 98.1 fL (ref 78.0–100.0)
PLATELETS: 215 10*3/uL (ref 150–400)
RBC: 3.2 MIL/uL — AB (ref 3.87–5.11)
RDW: 14 % (ref 11.5–15.5)
WBC: 4.4 10*3/uL (ref 4.0–10.5)

## 2014-04-19 LAB — TROPONIN I: Troponin I: <0.3 ng/mL (ref <0.30)

## 2014-04-19 MED ORDER — TECHNETIUM TC 99M SESTAMIBI - CARDIOLITE
30.0000 | Freq: Once | INTRAVENOUS | Status: AC | PRN
  Administered 2014-04-19: 12:00:00 30 via INTRAVENOUS

## 2014-04-19 MED ORDER — REGADENOSON 0.4 MG/5ML IV SOLN
INTRAVENOUS | Status: AC
  Filled 2014-04-19: qty 5

## 2014-04-19 MED ORDER — TECHNETIUM TC 99M SESTAMIBI GENERIC - CARDIOLITE
10.0000 | Freq: Once | INTRAVENOUS | Status: AC | PRN
  Administered 2014-04-19: 10 via INTRAVENOUS

## 2014-04-19 MED ORDER — REGADENOSON 0.4 MG/5ML IV SOLN
0.4000 mg | Freq: Once | INTRAVENOUS | Status: AC
  Administered 2014-04-19: 0.4 mg via INTRAVENOUS
  Filled 2014-04-19: qty 5

## 2014-04-19 MED ORDER — ACETAMINOPHEN 325 MG PO TABS
ORAL_TABLET | ORAL | Status: AC
  Filled 2014-04-19: qty 2

## 2014-04-19 NOTE — Progress Notes (Signed)
    Primary cardiologist: Dr. Satira Sark  Seen for followup: Chest pain    Subjective:    No recurring chest pain under observation, slept well.  Objective:   Temp:  [98.1 F (36.7 C)-98.3 F (36.8 C)] 98.3 F (36.8 C) (11/08 0500) Pulse Rate:  [53-63] 63 (11/08 0500) Resp:  [10-17] 16 (11/08 0500) BP: (99-151)/(50-103) 119/50 mmHg (11/08 0500) SpO2:  [97 %-100 %] 97 % (11/08 0500) Weight:  [182 lb 11.2 oz (82.872 kg)-188 lb (85.276 kg)] 183 lb 4.8 oz (83.144 kg) (11/08 0500) Last BM Date: 04/17/14  Filed Weights   04/18/14 1021 04/18/14 1558 04/19/14 0500  Weight: 188 lb (85.276 kg) 182 lb 11.2 oz (82.872 kg) 183 lb 4.8 oz (83.144 kg)    Intake/Output Summary (Last 24 hours) at 04/19/14 0827 Last data filed at 04/18/14 2100  Gross per 24 hour  Intake      0 ml  Output    200 ml  Net   -200 ml    Telemetry: Atrial paced rhythm.  Exam:  General: Appears comfortable at rest.  Lungs: Clear, nonlabored.  Cardiac: RRR, no gallop.  Abdomen: NABS.  Extremities: Stasis, no pitting edema.   Lab Results:  Basic Metabolic Panel:  Recent Labs Lab 04/18/14 1035 04/19/14 0250  NA 141 139  K 4.7 4.2  CL 103 102  CO2 25 25  GLUCOSE 107* 102*  BUN 11 9  CREATININE 0.61 0.64  CALCIUM 8.9 9.3    Liver Function Tests:  Recent Labs Lab 04/18/14 1035 04/19/14 0250  AST 17 11  ALT 11 8  ALKPHOS 50 51  BILITOT 0.4 0.5  PROT 6.5 6.2  ALBUMIN 3.0* 2.7*    CBC:  Recent Labs Lab 04/18/14 1035 04/19/14 0250  WBC 4.3 4.4  HGB 10.7* 10.5*  HCT 32.1* 31.4*  MCV 97.3 98.1  PLT 229 215    Cardiac Enzymes:  Recent Labs Lab 04/18/14 1444 04/18/14 2107 04/19/14 0250  TROPONINI <0.30 <0.30 <0.30    BNP:  Recent Labs  04/18/14 1035  PROBNP 971.7*     Medications:   Scheduled Medications: . amiodarone  100 mg Oral Daily  . apixaban  5 mg Oral BID  . calcium-vitamin D  2 tablet Oral Daily  . latanoprost  1 drop Both Eyes QHS  .  multivitamin with minerals  1 tablet Oral Daily  . nadolol  40 mg Oral QPM      PRN Medications:  acetaminophen, nitroGLYCERIN, ondansetron (ZOFRAN) IV   Assessment:   1. Chest pain syndrome, currently resolved, but with features concerning for potential accelerating angina. Cardiac markers are normal.  2. History of paroxysmal atrial fibrillation, currently well controlled on amiodarone and Eliquis.  3. Sick sinus syndrome status post pacemaker placement. Atrial pacing at baseline.   Plan/Discussion:    Proceed with Lexiscan Cardiolite today on medical therapy. If low risk study, she can be discharged home later today and keep office follow-up with me over the next 2-3 weeks.   Satira Sark, M.D., F.A.C.C.

## 2014-04-19 NOTE — Progress Notes (Signed)
Lexiscan stress test completed without significant complication. Some baseline TWI in lead III, aVF, V3-V5, along with incomplete RBBB. Unchanged during stress. Pending final result  Signed, Almyra Deforest PA Pager: [phone]

## 2014-04-19 NOTE — Discharge Summary (Signed)
Physician Discharge Summary  [name] HBZ:[id number] DOB: Oct 26, 1928 DOA: 04/18/2014  PCP: Sherrie Mustache, MD  Admit date: 04/18/2014 Discharge date: 04/19/2014  Time spent: 35 minutes  Recommendations for Outpatient Follow-up:  1. Dr.McDowell in 2 weeks  Discharge Diagnoses:  Principal Problem:   Chest pain Active Problems:   Hyperlipidemia   Essential hypertension, benign   Paroxysmal atrial fibrillation   Sick sinus syndrome   A-fib   Discharge Condition:stable  Diet recommendation: heart healthy  Filed Weights   04/18/14 1021 04/18/14 1558 04/19/14 0500  Weight: 85.276 kg (188 lb) 82.872 kg (182 lb 11.2 oz) 83.144 kg (183 lb 4.8 oz)    History of present illness:  [name] is a 80 y.o. female has a past medical history significant for hypertension, hyperlipidemia, sick sinus syndrome status post pacemaker about 12 years ago with a revision about 2 years ago, paroxysmal atrial fibrillation on amiodarone as well as anticoagulation with apixaban, presents to the emergency room with a chief complaint of chest pain. Patient woke up around 4 AM 11/7 morning and she experienced substernal pressure-like chest pain. She initially thought it was heartburn and took a Tums without relief. She does get occasional heartburn however she usually gets burning with heartburn. This pain was very different and never experienced anything similar in the past. She took 2 nitroglycerin without relief, and then decided to come to the emergency room. Her chest pain eventually subsided  and in all lasted about 4-5 hours  Hospital Course:  1. Chest pain syndrome, currently resolved, but with features concerning for potential accelerating angina. Cardiac markers are normal. Underwent Myoview which was negative for inducible ischemia and showed normal EF, she was subsequently discharged home in a stable condition  2. History of paroxysmal atrial fibrillation, currently well  controlled on amiodarone and Eliquis.  3. Sick sinus syndrome status post pacemaker placement. Atrial pacing at baseline.   Procedures: Myoview 11/8: no inducible ischemia, normal EF  Consultations:  Cardiology  Discharge Exam: Filed Vitals:   04/19/14 1427  BP: 122/43  Pulse: 64  Temp: 97.7 F (36.5 C)  Resp: 16    General: AAOx3 Cardiovascular: S1S2/RRR Respiratory: CTAB  Discharge Instructions You were cared for by a hospitalist during your hospital stay. If you have any questions about your discharge medications or the care you received while you were in the hospital after you are discharged, you can call the unit and asked to speak with the hospitalist on call if the hospitalist that took care of you is not available. Once you are discharged, your primary care physician will handle any further medical issues. Please note that NO REFILLS for any discharge medications will be authorized once you are discharged, as it is imperative that you return to your primary care physician (or establish a relationship with a primary care physician if you do not have one) for your aftercare needs so that they can reassess your need for medications and monitor your lab values.  Discharge Instructions    Diet - low sodium heart healthy    Complete by:  As directed      Increase activity slowly    Complete by:  As directed           Current Discharge Medication List    CONTINUE these medications which have NOT CHANGED   Details  amiodarone (PACERONE) 200 MG tablet TAKE 1/2 TABLET (100MG ) BY MOUTH ONCE A DAY EVERY EVENING Qty: 30 tablet, Refills: 5  apixaban (ELIQUIS) 5 MG TABS tablet Take 1 tablet (5 mg total) by mouth 2 (two) times daily. Qty: 60 tablet, Refills: 6    bimatoprost (LUMIGAN) 0.01 % SOLN Place 1 drop into both eyes at bedtime.    calcium-vitamin D (OSCAL WITH D) 500-200 MG-UNIT per tablet Take 2 tablets by mouth daily.      cyanocobalamin (,VITAMIN B-12,) 1000  MCG/ML injection Inject 1,000 mcg into the muscle every 30 (thirty) days.      Multiple Vitamin (MULTIVITAMIN) tablet Take 1 tablet by mouth daily.      !! nadolol (CORGARD) 40 MG tablet Take 40 mg by mouth every evening.    nitroGLYCERIN (NITROSTAT) 0.4 MG SL tablet Place 1 tablet (0.4 mg total) under the tongue every 5 (five) minutes as needed for chest pain. Qty: 25 tablet, Refills: 3    !! nadolol (CORGARD) 40 MG tablet TAKE 1 TABLET BY MOUTH ONCE A DAY WITH BREAKFAST Qty: 30 tablet, Refills: 10     !! - Potential duplicate medications found. Please discuss with provider.     Allergies  Allergen Reactions  . Iodinated Diagnostic Agents Hives, Itching and Rash    03/13/14: symptoms began within two hours of intrathecal injection (myelogram 03/11/14); on shoulders radiating down to stomach.  Relief with Benadryl.  Told patient she would need to pre-med with Benadryl in the future before receiving this contrast.  Brita Romp, RN  . Naproxen Hives, Itching and Rash   Follow-up Information    Follow up with Rozann Lesches, MD. Schedule an appointment as soon as possible for a visit in 2 weeks.   Specialty:  Cardiology   Contact information:   [place] [id number] [phone]        The results of significant diagnostics from this hospitalization (including imaging, microbiology, ancillary and laboratory) are listed below for reference.    Significant Diagnostic Studies: Dg Chest 2 View  04/18/2014   CLINICAL DATA:  80 year old female with chest pain  EXAM: CHEST  2 VIEW  COMPARISON:  Prior chest x-ray and rib series 511 2015  FINDINGS: Stable position of right subclavian approach cardiac rhythm maintenance device with leads projecting over the right atrium and right ventricle. Borderline cardiomegaly remains stable. No evidence of focal airspace consolidation, pleural effusion, pneumothorax or pulmonary edema. Stable mild central bronchitic change. Degenerative  spurring throughout the thoracic spine. Stable mild mid thoracic compression fracture with associated exaggerated kyphosis. Surgical clips in the right upper quadrant suggest prior cholecystectomy.  IMPRESSION: Stable chest x-ray without evidence of acute cardiopulmonary process.   Electronically Signed   By: Jacqulynn Cadet M.D.   On: 04/18/2014 12:30   Nm Myocar Multi W/spect W/wall Motion / Ef  04/19/2014   CLINICAL DATA:  Chest pain, hypertension, history of PAF and A-flutter post pacemaker implantation.  EXAM: MYOCARDIAL IMAGING WITH SPECT (REST AND PHARMACOLOGIC-STRESS)  GATED LEFT VENTRICULAR WALL MOTION STUDY  LEFT VENTRICULAR EJECTION FRACTION  TECHNIQUE: Standard myocardial SPECT imaging was performed after resting intravenous injection of 10 mCi Tc-27m sestamibi. Subsequently, intravenous infusion of Lexiscan was performed under the supervision of the Cardiology staff. At peak effect of the drug, 30 mCi Tc-74m sestamibi was injected intravenously and standard myocardial SPECT imaging was performed. Quantitative gated imaging was also performed to evaluate left ventricular wall motion, and estimate left ventricular ejection fraction.  COMPARISON:  Chest radiograph -04/18/2014  FINDINGS: Raw images: There is mild breast attenuation artifact. No significant patient motion artifact.  Perfusion: There  is a minimal amount of attenuation involving the apex and septum which nearly resolves on the provided stress images and is without associated regional wall motion abnormality. No scintigraphic evidence of prior infarction or pharmacologically induced ischemia.  Wall Motion: Normal wall motion.  No left ventricular dilatation.  Left Ventricular Ejection Fraction: 68 %  End diastolic volume 77 ml  End systolic volume 24 ml  IMPRESSION: 1. No scintigraphic evidence of prior infarction or pharmacologically induced ischemia.  2. Normal left ventricular wall motion.  3. Left ventricular ejection fraction 68%  4.  Low-risk stress test findings*.  *2012 Appropriate Use Criteria for Coronary Revascularization Focused Update: J Am Coll Cardiol. 5670;14(1):030-131. http://content.airportbarriers.com.aspx?articleid=1201161   Electronically Signed   By: Sandi Mariscal M.D.   On: 04/19/2014 12:49    Microbiology: No results found for this or any previous visit (from the past 240 hour(s)).   Labs: Basic Metabolic Panel:  Recent Labs Lab 04/18/14 1035 04/19/14 0250  NA 141 139  K 4.7 4.2  CL 103 102  CO2 25 25  GLUCOSE 107* 102*  BUN 11 9  CREATININE 0.61 0.64  CALCIUM 8.9 9.3   Liver Function Tests:  Recent Labs Lab 04/18/14 1035 04/19/14 0250  AST 17 11  ALT 11 8  ALKPHOS 50 51  BILITOT 0.4 0.5  PROT 6.5 6.2  ALBUMIN 3.0* 2.7*    Recent Labs Lab 04/18/14 1035  LIPASE 36   No results for input(s): AMMONIA in the last 168 hours. CBC:  Recent Labs Lab 04/18/14 1035 04/19/14 0250  WBC 4.3 4.4  NEUTROABS 2.6  --   HGB 10.7* 10.5*  HCT 32.1* 31.4*  MCV 97.3 98.1  PLT 229 215   Cardiac Enzymes:  Recent Labs Lab 04/18/14 1444 04/18/14 2107 04/19/14 0250 04/19/14 0844  TROPONINI <0.30 <0.30 <0.30 <0.30   BNP: BNP (last 3 results)  Recent Labs  04/18/14 1035  PROBNP 971.7*   CBG: No results for input(s): GLUCAP in the last 168 hours.     SignedDomenic Polite  [facility] 04/19/2014, 3:03 PM

## 2014-04-19 NOTE — Plan of Care (Signed)
Problem: Discharge Progression Outcomes Goal: No anginal pain Outcome: Completed/Met Date Met:  04/19/14 Goal: Hemodynamically stable Outcome: Completed/Met Date Met:  04/19/14 Goal: Complications resolved/controlled Outcome: Completed/Met Date Met:  04/19/14 Goal: Barriers To Progression Addressed/Resolved Outcome: Completed/Met Date Met:  04/19/14 Goal: Discharge plan in place and appropriate Outcome: Completed/Met Date Met:  04/19/14 Goal: Vascular site scale level 0 - I Vascular Site Scale Level 0: No bruising/bleeding/hematoma Level I (Mild): Bruising/Ecchymosis, minimal bleeding/ooozing, palpable hematoma < 3 cm Level II (Moderate): Bleeding not affecting hemodynamic parameters, pseudoaneurysm, palpable hematoma > 3 cm Level III (Severe) Bleeding which affects hemodynamic parameters or retroperitoneal hemorrhage  Outcome: Not Applicable Date Met:  04/19/14 Goal: Tolerates diet Outcome: Completed/Met Date Met:  04/19/14 Goal: Activity appropriate for discharge plan Outcome: Completed/Met Date Met:  04/19/14 Goal: Other Discharge Outcomes/Goals Outcome: Completed/Met Date Met:  04/19/14     

## 2014-04-19 NOTE — Plan of Care (Signed)
Problem: Phase I Progression Outcomes Goal: Hemodynamically stable Outcome: Completed/Met Date Met:  04/19/14 Goal: Anginal pain relieved Outcome: Completed/Met Date Met:  04/19/14 Goal: MD aware of Cardiac Marker results Outcome: Completed/Met Date Met:  04/19/14 Goal: Voiding-avoid urinary catheter unless indicated Outcome: Completed/Met Date Met:  04/19/14

## 2014-05-19 ENCOUNTER — Ambulatory Visit: Payer: Medicare Other | Admitting: Cardiology

## 2014-05-21 ENCOUNTER — Encounter (HOSPITAL_COMMUNITY): Payer: Self-pay | Admitting: Internal Medicine

## 2014-06-15 ENCOUNTER — Encounter: Payer: Self-pay | Admitting: Cardiology

## 2014-06-15 ENCOUNTER — Ambulatory Visit (INDEPENDENT_AMBULATORY_CARE_PROVIDER_SITE_OTHER): Payer: Medicare Other | Admitting: Cardiology

## 2014-06-15 VITALS — BP 130/79 | HR 60 | Ht 66.0 in | Wt 181.0 lb

## 2014-06-15 DIAGNOSIS — I480 Paroxysmal atrial fibrillation: Secondary | ICD-10-CM

## 2014-06-15 DIAGNOSIS — I495 Sick sinus syndrome: Secondary | ICD-10-CM

## 2014-06-15 NOTE — Progress Notes (Signed)
Reason for visit: Hospital follow-up, PAF  Clinical Summary Ms. Prestia is an 81 y.o.female last seen in July 2015. Record review finds hospitalization in November with chest pain. She ruled out for myocardial infarction and underwent a Lexiscan Myoview that was negative for scar or ischemia, LVEF 68%, low risk study. She continues to follow in the device clinic. Today she reports no recurring symptoms. She maintains treatment with amiodarone and Eliquis. Has had no bleeding episodes.  Lab work in November showed hemoglobin 10.5, platelets 215, potassium 4.2, BUN 9, creatinine 0.6, normal LFTs. Prior TSH in July 2015 was normal at 1.9.   Allergies  Allergen Reactions  . Iodinated Diagnostic Agents Hives, Itching and Rash    03/13/14: symptoms began within two hours of intrathecal injection (myelogram 03/11/14); on shoulders radiating down to stomach.  Relief with Benadryl.  Told patient she would need to pre-med with Benadryl in the future before receiving this contrast.  Brita Romp, RN  . Naproxen Hives, Itching and Rash    Current Outpatient Prescriptions  Medication Sig Dispense Refill  . amiodarone (PACERONE) 200 MG tablet TAKE 1/2 TABLET (100MG ) BY MOUTH ONCE A DAY EVERY EVENING 30 tablet 5  . apixaban (ELIQUIS) 5 MG TABS tablet Take 1 tablet (5 mg total) by mouth 2 (two) times daily. 60 tablet 6  . bimatoprost (LUMIGAN) 0.01 % SOLN Place 1 drop into both eyes at bedtime.    . calcium-vitamin D (OSCAL WITH D) 500-200 MG-UNIT per tablet Take 2 tablets by mouth daily.      . cyanocobalamin (,VITAMIN B-12,) 1000 MCG/ML injection Inject 1,000 mcg into the muscle every 30 (thirty) days.      . Multiple Vitamin (MULTIVITAMIN) tablet Take 1 tablet by mouth daily.      . nadolol (CORGARD) 40 MG tablet TAKE 1 TABLET BY MOUTH ONCE A DAY WITH BREAKFAST 30 tablet 10  . nitroGLYCERIN (NITROSTAT) 0.4 MG SL tablet Place 1 tablet (0.4 mg total) under the tongue every 5 (five) minutes as needed for  chest pain. 25 tablet 3   No current facility-administered medications for this visit.    Past Medical History  Diagnosis Date  . Arthritis   . Glaucoma   . Diverticulosis   . GERD (gastroesophageal reflux disease)   . Mixed hyperlipidemia   . Essential hypertension, benign   . Colon polyps   . Paroxysmal atrial fibrillation   . Atrial flutter     s/p RFA 2003  . Pneumonia     7/03, with encephalopathy  . Sick sinus syndrome     s/p PPM (SJM)  . Varicose veins   . DVT, lower extremity   . IBS (irritable bowel syndrome)   . Osteoporosis   . Breast cancer     1980's - lumpectomy only  . Sleep apnea     STOP BANG score 5    Past Surgical History  Procedure Laterality Date  . Cholecystectomy    . Pacemaker insertion  2003  . Breast lumpectomy      Right breast   . Colonoscopy w/ biopsies and polypectomy  03/2003, 05/2008, 02/20/2011    severe diverticulosis, tubulovillous adenoma polyp, internal and external hemorrhoids 2012: severe diverticulosis, 4 mm polyp, hemorrhoids  . Cataracts    . Fractured wrist      repair  . Total knee arthroplasty  12/20/2011    Procedure: TOTAL KNEE ARTHROPLASTY;  Surgeon: Tobi Bastos, MD;  Location: [facility];  Service: Orthopedics;  Laterality: Left;  .  Pacemaker generator change  06/18/12    SJM [employer] DR RF, Dr Rayann Heman  . Cardiac catheterization  1999    Normal coronaries  . Permanent pacemaker generator change N/A 06/18/2012    Procedure: PERMANENT PACEMAKER GENERATOR CHANGE;  Surgeon: Thompson Grayer, MD;  Location: [facility] CATH LAB;  Service: Cardiovascular;  Laterality: N/A;    Social History Ms. Groll reports that she has never smoked. She has never used smokeless tobacco. Ms. Dennen reports that she does not drink alcohol.  Review of Systems Complete review of systems negative except as otherwise outlined in the clinical summary and also the following. No significant palpitations. No falls. Arthritis with pain involving her  shoulders, hips, and knees.  Physical Examination Filed Vitals:   06/15/14 1456  BP: 130/79  Pulse: 60   Filed Weights   06/15/14 1456  Weight: 181 lb (82.101 kg)    Comfortable at rest.  HEENT: Conjunctiva and lids normal, oropharynx clear.  Neck: Supple, no carotid bruits, no elevated jugular venous pressure.  Lungs: Clear to auscultation, nonlabored.  Cardiac: Regular rate and rhythm, no significant systolic murmur or S3..  Abdomen: Soft, bowel sounds present.  Extremities: No pitting. Varicose veins noted.  Musculoskeletal: No kyphosis.  Skin: Warm and dry.  Neuropsychiatric: Alert and oriented x3, affect appropriate.   Problem List and Plan   Paroxysmal atrial fibrillation Controlled on current regimen including amiodarone and Eliquis. Lab work within the last 6 months reviewed.  Sick sinus syndrome Status post St. Jude pacemaker, followed in the device clinic.    Satira Sark, M.D., F.A.C.C.

## 2014-06-15 NOTE — Assessment & Plan Note (Signed)
Status post [employer]. Jude pacemaker, followed in the device clinic.

## 2014-06-15 NOTE — Assessment & Plan Note (Signed)
Controlled on current regimen including amiodarone and Eliquis. Lab work within the last 6 months reviewed.

## 2014-06-15 NOTE — Patient Instructions (Signed)
Continue all current medications. Your physician wants you to follow up in: 6 months.  You will receive a reminder letter in the mail one-two months in advance.  If you don't receive a letter, please call our office to schedule the follow up appointment   

## 2014-07-02 ENCOUNTER — Encounter: Payer: Self-pay | Admitting: Internal Medicine

## 2014-07-02 ENCOUNTER — Ambulatory Visit (INDEPENDENT_AMBULATORY_CARE_PROVIDER_SITE_OTHER): Payer: Medicare Other | Admitting: *Deleted

## 2014-07-02 DIAGNOSIS — I495 Sick sinus syndrome: Secondary | ICD-10-CM

## 2014-07-02 LAB — MDC_IDC_ENUM_SESS_TYPE_REMOTE
Battery Remaining Longevity: 106 mo
Brady Statistic AP VP Percent: 3.8 %
Date Time Interrogation Session: 20160121070011
Implantable Pulse Generator Model: 2210
Implantable Pulse Generator Serial Number: 7438567
Lead Channel Impedance Value: 550 Ohm
Lead Channel Pacing Threshold Amplitude: 0.75 V
Lead Channel Pacing Threshold Amplitude: 1 V
Lead Channel Pacing Threshold Pulse Width: 0.5 ms
Lead Channel Setting Pacing Amplitude: 1 V
Lead Channel Setting Pacing Amplitude: 2 V
MDC IDC MSMT BATTERY REMAINING PERCENTAGE: 91 %
MDC IDC MSMT BATTERY VOLTAGE: 2.98 V
MDC IDC MSMT LEADCHNL RA IMPEDANCE VALUE: 360 Ohm
MDC IDC MSMT LEADCHNL RA SENSING INTR AMPL: 0.5 mV
MDC IDC MSMT LEADCHNL RV PACING THRESHOLD PULSEWIDTH: 0.5 ms
MDC IDC MSMT LEADCHNL RV SENSING INTR AMPL: 12 mV
MDC IDC SET LEADCHNL RV PACING PULSEWIDTH: 0.5 ms
MDC IDC SET LEADCHNL RV SENSING SENSITIVITY: 2 mV
MDC IDC STAT BRADY AP VS PERCENT: 96 %
MDC IDC STAT BRADY AS VP PERCENT: 1 %
MDC IDC STAT BRADY AS VS PERCENT: 1 %
MDC IDC STAT BRADY RA PERCENT PACED: 99 %
MDC IDC STAT BRADY RV PERCENT PACED: 3.8 %

## 2014-07-02 NOTE — Progress Notes (Signed)
Remote pacemaker transmission.   

## 2014-07-07 ENCOUNTER — Encounter: Payer: Self-pay | Admitting: Cardiology

## 2014-07-07 ENCOUNTER — Ambulatory Visit: Payer: Medicare Other | Admitting: Cardiology

## 2014-07-07 ENCOUNTER — Other Ambulatory Visit: Payer: Self-pay | Admitting: Internal Medicine

## 2014-08-31 ENCOUNTER — Other Ambulatory Visit: Payer: Self-pay | Admitting: Internal Medicine

## 2014-09-03 ENCOUNTER — Telehealth: Payer: Self-pay | Admitting: Cardiology

## 2014-09-04 NOTE — Telephone Encounter (Signed)
Attempted to return call - Dr. Murrell Redden office closed today.

## 2014-09-07 NOTE — Telephone Encounter (Signed)
Received call from Dr. Edrick Oh this morning.  MD stated that he wanted to discuss the Nadolol that Dr. Domenic Polite had put her on years ago.  Stated that her insurance is now not approving.  Wanted to see what suggestion MD had in regards to changing to something else since he initially prescribed.  Dr. Edrick Oh stated that she did have enough medication to get her through the next week.  Informed him that Dr. Domenic Polite is out of the office till 09/14/2014 & he is okay to wait for a return call when Dr. Domenic Polite is back in the office.

## 2014-09-09 ENCOUNTER — Other Ambulatory Visit: Payer: Self-pay | Admitting: Orthopedic Surgery

## 2014-09-09 DIAGNOSIS — M25561 Pain in right knee: Secondary | ICD-10-CM

## 2014-09-11 ENCOUNTER — Encounter: Payer: Self-pay | Admitting: Internal Medicine

## 2014-09-11 ENCOUNTER — Ambulatory Visit (INDEPENDENT_AMBULATORY_CARE_PROVIDER_SITE_OTHER): Payer: Medicare Other | Admitting: Internal Medicine

## 2014-09-11 VITALS — BP 122/80 | HR 64 | Ht 66.0 in | Wt 174.0 lb

## 2014-09-11 DIAGNOSIS — I10 Essential (primary) hypertension: Secondary | ICD-10-CM

## 2014-09-11 DIAGNOSIS — I480 Paroxysmal atrial fibrillation: Secondary | ICD-10-CM

## 2014-09-11 DIAGNOSIS — I495 Sick sinus syndrome: Secondary | ICD-10-CM

## 2014-09-11 LAB — MDC_IDC_ENUM_SESS_TYPE_INCLINIC
Brady Statistic RA Percent Paced: 99 %
Brady Statistic RV Percent Paced: 3.7 %
Implantable Pulse Generator Model: 2210
Lead Channel Impedance Value: 600 Ohm
Lead Channel Pacing Threshold Amplitude: 0.625 V
Lead Channel Pacing Threshold Amplitude: 1 V
Lead Channel Pacing Threshold Pulse Width: 0.5 ms
Lead Channel Sensing Intrinsic Amplitude: 0.7 mV
Lead Channel Sensing Intrinsic Amplitude: 12 mV
Lead Channel Setting Pacing Pulse Width: 0.5 ms
MDC IDC MSMT BATTERY REMAINING LONGEVITY: 120 mo
MDC IDC MSMT BATTERY VOLTAGE: 2.98 V
MDC IDC MSMT LEADCHNL RA IMPEDANCE VALUE: 362.5 Ohm
MDC IDC MSMT LEADCHNL RA PACING THRESHOLD PULSEWIDTH: 0.5 ms
MDC IDC PG SERIAL: 7438567
MDC IDC SESS DTM: 20160401122459
MDC IDC SET LEADCHNL RA PACING AMPLITUDE: 2 V
MDC IDC SET LEADCHNL RV PACING AMPLITUDE: 0.875
MDC IDC SET LEADCHNL RV SENSING SENSITIVITY: 2 mV

## 2014-09-11 NOTE — Progress Notes (Signed)
Electrophysiology Office Note   Date:  09/11/2014   ID:  [name], DOB 04/20/1929, MRN [id number]  PCP:  Sherrie Mustache, MD  Cardiologist:  Dr Domenic Polite Primary Electrophysiologist: Thompson Grayer, MD    Chief Complaint  Patient presents with  . Atrial Fibrillation     History of Present Illness: [name] is a 81 y.o. female who presents today for electrophysiology evaluation.   She is doing well.  She had palpitations about a week ago which she thinks could have been afib.  AFib has otherwise been well controlled.  She has some fatigue. Today, she denies symptoms of  chest pain, shortness of breath, orthopnea, PND, lower extremity edema, claudication, dizziness, presyncope, syncope, bleeding, or neurologic sequela. The patient is tolerating medications without difficulties and is otherwise without complaint today.    Past Medical History  Diagnosis Date  . Arthritis   . Glaucoma   . Diverticulosis   . GERD (gastroesophageal reflux disease)   . Mixed hyperlipidemia   . Essential hypertension, benign   . Colon polyps   . Paroxysmal atrial fibrillation   . Atrial flutter     s/p RFA 2003  . Pneumonia     7/03, with encephalopathy  . Sick sinus syndrome     s/p PPM (SJM)  . Varicose veins   . DVT, lower extremity   . IBS (irritable bowel syndrome)   . Osteoporosis   . Breast cancer     1980's - lumpectomy only  . Sleep apnea     STOP BANG score 5   Past Surgical History  Procedure Laterality Date  . Cholecystectomy    . Pacemaker insertion  2003  . Breast lumpectomy      Right breast   . Colonoscopy w/ biopsies and polypectomy  03/2003, 05/2008, 02/20/2011    severe diverticulosis, tubulovillous adenoma polyp, internal and external hemorrhoids 2012: severe diverticulosis, 4 mm polyp, hemorrhoids  . Cataracts    . Fractured wrist      repair  . Total knee arthroplasty  12/20/2011    Procedure: TOTAL KNEE ARTHROPLASTY;  Surgeon: Tobi Bastos, MD;  Location: [facility];  Service: Orthopedics;  Laterality: Left;  . Pacemaker generator change  06/18/12    SJM Acc[employer] DR RF, Dr Rayann Heman  . Cardiac catheterization  1999    Normal coronaries  . Permanent pacemaker generator change N/A 06/18/2012    Procedure: PERMANENT PACEMAKER GENERATOR CHANGE;  Surgeon: Thompson Grayer, MD;  Location: [facility] CATH LAB;  Service: Cardiovascular;  Laterality: N/A;     Current Outpatient Prescriptions  Medication Sig Dispense Refill  . apixaban (ELIQUIS) 5 MG TABS tablet Take 1 tablet (5 mg total) by mouth 2 (two) times daily. 60 tablet 6  . bimatoprost (LUMIGAN) 0.01 % SOLN Place 1 drop into both eyes at bedtime.    . calcium-vitamin D (OSCAL WITH D) 500-200 MG-UNIT per tablet Take 2 tablets by mouth daily.      . cyanocobalamin (,VITAMIN B-12,) 1000 MCG/ML injection Inject 1,000 mcg into the muscle every 30 (thirty) days.      . Multiple Vitamin (MULTIVITAMIN) tablet Take 1 tablet by mouth daily.      . nadolol (CORGARD) 40 MG tablet TAKE ONE TABLET BY MOUTH DAILY WITH BREAKFAST 30 tablet 6  . nitroGLYCERIN (NITROSTAT) 0.4 MG SL tablet Place 1 tablet (0.4 mg total) under the tongue every 5 (five) minutes as needed for chest pain. 25 tablet 3  . PACERONE 200 MG  tablet TAKE 1/2 TABLET (100MG ) BY MOUTH ONCE A DAY EVERY EVENING 30 tablet 4   No current facility-administered medications for this visit.    Allergies:   Iodinated diagnostic agents and Naproxen   Social History:  The patient  reports that she has never smoked. She has never used smokeless tobacco. She reports that she does not drink alcohol or use illicit drugs.   Family History:  The patient's family history includes Cancer in her father; Colon cancer in her brother and other; Heart attack in an other family member; Stroke in her mother.    ROS:  Please see the history of present illness.   All other systems are reviewed and negative.    PHYSICAL EXAM: VS:  BP 122/80 mmHg  Pulse 64  Ht 5'  6" (1.676 m)  Wt 174 lb (78.926 kg)  BMI 28.10 kg/m2  SpO2 97% , BMI Body mass index is 28.1 kg/(m^2). GEN: Well nourished, well developed, in no acute distress HEENT: normal Neck: no JVD, carotid bruits, or masses Cardiac: RRR; no murmurs, rubs, or gallops,no edema  Respiratory:  clear to auscultation bilaterally, normal work of breathing GI: soft, nontender, nondistended, + BS MS: no deformity or atrophy Skin: warm and dry, device pocket is well healed Neuro:  Strength and sensation are intact Psych: euthymic mood, full affect  Device interrogation is reviewed today in detail.  See PaceArt for details.   Recent Labs: 04/18/2014: Pro B Natriuretic peptide (BNP) 971.7* 04/19/2014: ALT 8; BUN 9; Creatinine 0.64; Hemoglobin 10.5*; Platelets 215; Potassium 4.2; Sodium 139    Lipid Panel  No results found for: CHOL, TRIG, HDL, CHOLHDL, VLDL, LDLCALC, LDLDIRECT   Wt Readings from Last 3 Encounters:  09/11/14 174 lb (78.926 kg)  06/15/14 181 lb (82.101 kg)  04/19/14 183 lb 4.8 oz (83.144 kg)     ASSESSMENT AND PLAN:  1.  Sick sinus syndrome Normal pacemaker function See Pace Art report No changes today  2. Paroxysmal atrial fibrillation Well controlled with amiodarone 100mg  daily chads2vasc score is at least 4 Continue eliquis long term Dr Domenic Polite is following LFTs/TFTs  3. HTN Stable No change required today   Current medicines are reviewed at length with the patient today.   The patient does not have concerns regarding her medicines.  The following changes were made today:  none  Labs/ tests ordered today include:  Orders Placed This Encounter  Procedures  . Implantable device check    Follow-up: merlin, return to see me in 1 year  Signed, Thompson Grayer, MD  09/11/2014 12:32 PM     [place] [address] [place] [id number] [phone] (office) [phone] (fax)

## 2014-09-11 NOTE — Patient Instructions (Signed)
Your physician recommends that you schedule a follow-up appointment in: 1 year with Dr. Rayann Heman. You will receive a reminder letter in the mail in about 10 months reminding you to call and schedule your appointment. If you don't receive this letter, please contact our office. Merlin device check on 12/15/14. Your physician recommends that you continue on your current medications as directed. Please refer to the Current Medication list given to you today.

## 2014-09-14 NOTE — Telephone Encounter (Signed)
Notified [facility] - will fax note to their office.   Fax:  [phone]

## 2014-09-14 NOTE — Telephone Encounter (Signed)
She has been on Nadolol for many years, I think even predating by involvement in her care. She is on a low dose, and also on amiodarone for management of PAF. Reasonable replacement options would be either Toprol-XL 25 mg daily or atenolol 25 mg daily.

## 2014-09-16 ENCOUNTER — Telehealth: Payer: Self-pay | Admitting: *Deleted

## 2014-09-16 NOTE — Telephone Encounter (Signed)
Patient is having a CT scan of her right knee and is needing to hold eliquis 2 days prior to test. Please advise if this okay.

## 2014-09-17 NOTE — Telephone Encounter (Signed)
Pt needs to be scheduled for for a CT arthrogram of the Rt Knee.  She can hold Eliquis 48 hours before procedure and restart the night of procedure if ok with MD.  Virgel Gess to [place].

## 2014-09-17 NOTE — Telephone Encounter (Signed)
Roberta with [facility] called in regards to patient holding her Eliquis.  She would like for you to fax the response back To her [phone]

## 2014-09-22 ENCOUNTER — Encounter: Payer: Self-pay | Admitting: Internal Medicine

## 2014-09-23 ENCOUNTER — Other Ambulatory Visit: Payer: Medicare Other

## 2014-09-25 ENCOUNTER — Ambulatory Visit
Admission: RE | Admit: 2014-09-25 | Discharge: 2014-09-25 | Disposition: A | Discharge status: Home / Self Care | Payer: Medicare Other | Source: Ambulatory Visit | Attending: Orthopedic Surgery | Admitting: Orthopedic Surgery

## 2014-09-25 DIAGNOSIS — M25561 Pain in right knee: Secondary | ICD-10-CM

## 2014-09-25 MED ORDER — IOHEXOL 180 MG/ML  SOLN
40.0000 mL | Freq: Once | INTRAMUSCULAR | Status: AC | PRN
  Administered 2014-09-25: 40 mL via INTRA_ARTICULAR

## 2014-10-15 ENCOUNTER — Other Ambulatory Visit: Payer: Self-pay | Admitting: Surgical

## 2014-10-15 NOTE — Progress Notes (Signed)
Please put orders in Epic surgery 10-20-14 pre op 10-16-14 Thanks

## 2014-10-16 ENCOUNTER — Encounter (HOSPITAL_COMMUNITY)
Admission: RE | Admit: 2014-10-16 | Discharge: 2014-10-16 | Disposition: A | Discharge status: Home / Self Care | Payer: Medicare Other | Source: Ambulatory Visit | Attending: Orthopedic Surgery | Admitting: Orthopedic Surgery

## 2014-10-16 ENCOUNTER — Encounter (HOSPITAL_COMMUNITY): Payer: Self-pay

## 2014-10-16 DIAGNOSIS — Z01812 Encounter for preprocedural laboratory examination: Secondary | ICD-10-CM | POA: Insufficient documentation

## 2014-10-16 DIAGNOSIS — S83241A Other tear of medial meniscus, current injury, right knee, initial encounter: Secondary | ICD-10-CM | POA: Insufficient documentation

## 2014-10-16 DIAGNOSIS — X58XXXA Exposure to other specified factors, initial encounter: Secondary | ICD-10-CM | POA: Diagnosis not present

## 2014-10-16 HISTORY — DX: Urinary tract infection, site not specified: N39.0

## 2014-10-16 LAB — CBC WITH DIFFERENTIAL/PLATELET
Basophils Absolute: 0 10*3/uL (ref 0.0–0.1)
Basophils Relative: 0 % (ref 0–1)
Eosinophils Absolute: 0.1 10*3/uL (ref 0.0–0.7)
Eosinophils Relative: 1 % (ref 0–5)
HCT: 35 % — ABNORMAL LOW (ref 36.0–46.0)
Hemoglobin: 11.3 g/dL — ABNORMAL LOW (ref 12.0–15.0)
Lymphocytes Relative: 29 % (ref 12–46)
Lymphs Abs: 1.5 10*3/uL (ref 0.7–4.0)
MCH: 32.6 pg (ref 26.0–34.0)
MCHC: 32.3 g/dL (ref 30.0–36.0)
MCV: 100.9 fL — ABNORMAL HIGH (ref 78.0–100.0)
Monocytes Absolute: 0.6 10*3/uL (ref 0.1–1.0)
Monocytes Relative: 12 % (ref 3–12)
Neutro Abs: 2.8 10*3/uL (ref 1.7–7.7)
Neutrophils Relative %: 58 % (ref 43–77)
Platelets: 253 10*3/uL (ref 150–400)
RBC: 3.47 MIL/uL — ABNORMAL LOW (ref 3.87–5.11)
RDW: 14.9 % (ref 11.5–15.5)
WBC: 5 10*3/uL (ref 4.0–10.5)

## 2014-10-16 LAB — PROTIME-INR
INR: 1.27 (ref 0.00–1.49)
Prothrombin Time: 16 seconds — ABNORMAL HIGH (ref 11.6–15.2)

## 2014-10-16 LAB — COMPREHENSIVE METABOLIC PANEL
ALT: 13 U/L — ABNORMAL LOW (ref 14–54)
AST: 15 U/L (ref 15–41)
Albumin: 3.6 g/dL (ref 3.5–5.0)
Alkaline Phosphatase: 57 U/L (ref 38–126)
Anion gap: 8 (ref 5–15)
BUN: 11 mg/dL (ref 6–20)
CO2: 28 mmol/L (ref 22–32)
Calcium: 9.4 mg/dL (ref 8.9–10.3)
Chloride: 106 mmol/L (ref 101–111)
Creatinine, Ser: 0.55 mg/dL (ref 0.44–1.00)
GFR calc Af Amer: >60 mL/min (ref >60)
GFR calc non Af Amer: >60 mL/min (ref >60)
Glucose, Bld: 101 mg/dL — ABNORMAL HIGH (ref 70–99)
Potassium: 4.2 mmol/L (ref 3.5–5.1)
Sodium: 142 mmol/L (ref 135–145)
Total Bilirubin: 0.7 mg/dL (ref 0.3–1.2)
Total Protein: 6.8 g/dL (ref 6.5–8.1)

## 2014-10-16 NOTE — Progress Notes (Signed)
PT results per epic from PAT visit 10/16/2014 sent to Dr Gladstone Lighter

## 2014-10-16 NOTE — Progress Notes (Addendum)
CXR per epic 04/18/2014 EKG per epic 04/19/2014 NM Myocar mult. W/spect w wall motion EF epic 04/19/2014 Stress Test 04/19/2014 per epic  Clearance note per Dr Domenic Polite on chart 06/15/2014 Dr Allred LOV epic 09/11/2014 Dr Domenic Polite LOV epic 06/15/2014

## 2014-10-16 NOTE — Progress Notes (Signed)
Your patient has screened at an elevated risk for Obstructive Sleep Apnea using the Stop-Bang Tool during a pre-surgical vist. A score of 4 or greater is an elevated risk. Score of 4. 

## 2014-10-16 NOTE — Progress Notes (Signed)
Have faxed orders 3 times to device clinic; attempted to call device clinic twice leaving VM messages for return call back in regards to pacemaker orders. Have not received call back. Called Dr Jackalyn Lombard office. Notified MD out of office till Monday 10/19/2014.

## 2014-10-16 NOTE — Patient Instructions (Addendum)
[place]  10/16/2014   Your procedure is scheduled on:    Tuesday Oct 20, 2014  Report to [facility] Main Entrance and follow signs to  [place] arrive at 10:30 AM.   Call this number if you have problems the morning of surgery [phone] or Presurgical Testing [phone].   Remember:  Do not eat food or drink liquids :After Midnight.  For Living Will and/or Health Care Power Attorney Forms: please provide copy for your medical record, may bring AM of surgery (forms should be already notarized-we do not provide this service).   Take these medicines the morning of surgery with A SIP OF WATER: Nadolol (Corgard)   (per Dr Domenic Polite - pt to stop Eliquis 2 days prior to surgery - 10/18/2014)                               You may not have any metal on your body including hair pins and piercings  Do not wear jewelry, make-up, lotions, powders, perfumes, nail polish or deodorant.  Do not shave body hair  48 hours(2 days) of CHG soap use.               Do not bring valuables to the hospital. [place].  Contacts, dentures or bridgework may not be worn into surgery.    Patients discharged the day of surgery will not be allowed to drive home.  Name and phone number of your driver:Ronnie Deardorff (son)   ________________________________________________________________________  [facility] - Preparing for Surgery Before surgery, you can play an important role.  Because skin is not sterile, your skin needs to be as free of germs as possible.  You can reduce the number of germs on your skin by washing with CHG (chlorahexidine gluconate) soap before surgery.  CHG is an antiseptic cleaner which kills germs and bonds with the skin to continue killing germs even after washing. Please DO NOT use if you have an allergy to CHG or antibacterial soaps.  If your skin becomes reddened/irritated stop using the CHG and inform your nurse when you  arrive at Short Stay. Do not shave (including legs and underarms) for at least 48 hours prior to the first CHG shower.  You may shave your face/neck. Please follow these instructions carefully:  1.  Shower with CHG Soap the night before surgery and the  morning of Surgery.  2.  If you choose to wash your hair, wash your hair first as usual with your  normal  shampoo.  3.  After you shampoo, rinse your hair and body thoroughly to remove the  shampoo.                           4.  Use CHG as you would any other liquid soap.  You can apply chg directly  to the skin and wash                       Gently with a scrungie or clean washcloth.  5.  Apply the CHG Soap to your body ONLY FROM THE NECK DOWN.   Do not use on face/ open                           Wound or open sores. Avoid  contact with eyes, ears mouth and genitals (private parts).                       Wash face,  Genitals (private parts) with your normal soap.             6.  Wash thoroughly, paying special attention to the area where your surgery  will be performed.  7.  Thoroughly rinse your body with warm water from the neck down.  8.  DO NOT shower/wash with your normal soap after using and rinsing off  the CHG Soap.                9.  Pat yourself dry with a clean towel.            10.  Wear clean pajamas.            11.  Place clean sheets on your bed the night of your first shower and do not  sleep with pets. Day of Surgery : Do not apply any lotions/deodorants the morning of surgery.  Please wear clean clothes to the hospital/surgery center.  FAILURE TO FOLLOW THESE INSTRUCTIONS MAY RESULT IN THE CANCELLATION OF YOUR SURGERY PATIENT SIGNATURE_________________________________  NURSE SIGNATURE__________________________________  ________________________________________________________________________

## 2014-10-19 ENCOUNTER — Telehealth: Payer: Self-pay | Admitting: Internal Medicine

## 2014-10-19 NOTE — H&P (Signed)
[name] is an 81 y.o. female.   Chief Complaint: right knee pain HPI: The patient presented with the chief complaint of right knee pain. We had treated her for osteoarthritis in her knee previously and she had been responsive to cortisone injections. She presented in March with sudden increase in discomfort and was unresponsive to both cortisone injection and oral corticosteroids. CT showed torn medial meniscus.   Past Medical History  Diagnosis Date  . Arthritis   . Glaucoma   . Diverticulosis   . Mixed hyperlipidemia   . Essential hypertension, benign   . Colon polyps   . Paroxysmal atrial fibrillation   . Atrial flutter     s/p RFA 2003  . Sick sinus syndrome     s/p PPM (SJM)  . Varicose veins   . IBS (irritable bowel syndrome)   . Osteoporosis   . Breast cancer     1980's - lumpectomy only  . Anginal pain     CPs comes and goes uses nitro to relief no precipitating factors as stated per pt   . Dysrhythmia   . Presence of permanent cardiac pacemaker     09/2014 per Dr Rayann Heman  . DVT, lower extremity     1980's bilat   . Pneumonia     7/03, with encephalopathy pt states had in 2010   . Shortness of breath dyspnea     climbing stairs  . Urinary tract infection     hx of   . GERD (gastroesophageal reflux disease)     controls by diet uses TUMs if needed  . Sleep apnea     STOP BANG score 5; stop bang tool results per PAT visit 10/16/2014 4; results sent to PCP.    Past Surgical History  Procedure Laterality Date  . Cholecystectomy    . Pacemaker insertion  2003  . Breast lumpectomy      Right breast   . Colonoscopy w/ biopsies and polypectomy  03/2003, 05/2008, 02/20/2011    severe diverticulosis, tubulovillous adenoma polyp, internal and external hemorrhoids 2012: severe diverticulosis, 4 mm polyp, hemorrhoids  . Cataracts    . Fractured wrist      repair  . Total knee arthroplasty  12/20/2011    Procedure: TOTAL KNEE ARTHROPLASTY;  Surgeon: Tobi Bastos, MD;  Location: [facility];  Service: Orthopedics;  Laterality: Left;  . Pacemaker generator change  06/18/12    SJM [employer] DR RF, Dr Rayann Heman  . Cardiac catheterization  1999    Normal coronaries  . Permanent pacemaker generator change N/A 06/18/2012    Procedure: PERMANENT PACEMAKER GENERATOR CHANGE;  Surgeon: Thompson Grayer, MD;  Location: [facility] CATH LAB;  Service: Cardiovascular;  Laterality: N/A;  . Insert / replace / remove pacemaker    . Dilation and curettage of uterus    . Eye surgery      Family History  Problem Relation Age of Onset  . Colon cancer Brother   . Colon cancer Other     Grandfather  . Cancer Father   . Stroke Mother   . Heart attack      CHF grandmother   Social History:  reports that she has never smoked. She has never used smokeless tobacco. She reports that she does not drink alcohol or use illicit drugs.  Allergies:  Allergies  Allergen Reactions  . Iodinated Diagnostic Agents Hives, Itching and Rash    03/13/14: symptoms began within two hours of intrathecal injection (myelogram 03/11/14); on  shoulders radiating down to stomach.  Relief with Benadryl.  Told patient she would need to pre-med with Benadryl in the future before receiving this contrast.  Brita Romp, RN  . Naproxen Hives, Itching and Rash      Review of Systems  Constitutional: Positive for malaise/fatigue. Negative for fever, chills, weight loss and diaphoresis.  HENT: Positive for hearing loss. Negative for congestion, ear discharge, ear pain, nosebleeds, sore throat and tinnitus.   Eyes: Negative.   Respiratory: Positive for shortness of breath. Negative for cough, hemoptysis, sputum production, wheezing and stridor.        SOB with exertion  Cardiovascular: Positive for orthopnea. Negative for chest pain, palpitations, claudication, leg swelling and PND.  Gastrointestinal: Positive for heartburn. Negative for nausea, vomiting, abdominal pain, diarrhea, constipation, blood in stool and  melena.  Genitourinary: Negative.   Musculoskeletal: Positive for back pain and joint pain. Negative for myalgias, falls and neck pain.       Right knee pain  Skin: Negative.   Neurological: Negative.  Negative for weakness and headaches.  Endo/Heme/Allergies: Negative.   Psychiatric/Behavioral: Negative.      Vitals  Weight: 185 lb Height: 64.5in Body Surface Area: 1.9 m Body Mass Index: 31.26 kg/m  BP: 133/85 (Sitting, Right Arm, Standard) Pulse: 76 bpm  Physical Exam  Constitutional: She is oriented to person, place, and time. She appears well-developed. No distress.  Overweight  HENT:  Head: Normocephalic and atraumatic.  Right Ear: External ear normal.  Left Ear: External ear normal.  Nose: Nose normal.  Mouth/Throat: Oropharynx is clear and moist.  Eyes: Conjunctivae and EOM are normal.  Neck: Normal range of motion. Neck supple.  Cardiovascular: Normal rate, regular rhythm, normal heart sounds and intact distal pulses.   No murmur heard. Respiratory: Effort normal and breath sounds normal. No respiratory distress. She has no wheezes.  GI: Soft. Bowel sounds are normal. She exhibits no distension. There is no tenderness.  Musculoskeletal:       Right hip: She exhibits decreased range of motion.       Left hip: Normal.       Right knee: She exhibits decreased range of motion and swelling. She exhibits no effusion and no erythema. Tenderness found. Medial joint line and lateral joint line tenderness noted.       Left knee: Normal.  Neurological: She is alert and oriented to person, place, and time. She has normal strength and normal reflexes. No sensory deficit.  Skin: No rash noted. She is not diaphoretic. No erythema.  Psychiatric: She has a normal mood and affect. Her behavior is normal.     Assessment/Plan Right knee, medial meniscus tear Primary osteoarthritis, right knee She has a medial meniscus tear in the right knee, most likely secondary to the  arthritic changes that are present there. Dr. Gladstone Lighter discussed that risks and benefits of the procedure with the patient.    H&P performed by Dr. Latanya Maudlin Documented by Ardeen Jourdain, PA-C  [place], Jessica Checketts Ander Purpura 10/19/2014, 9:37 AM

## 2014-10-19 NOTE — Telephone Encounter (Signed)
New message       Calling stating that they need pacemaker orders from Dr. Rayann Heman for this pt, pt is scheduled to have surgery tomorrow and they state they have faxed forms multiple times to our office. Please call back and advise.

## 2014-10-19 NOTE — Progress Notes (Signed)
Pacemaker orders placed on front of chart 10/19/2014

## 2014-10-19 NOTE — Progress Notes (Signed)
Pt notified of surgical date and time change has now changed from 10/20/2014 to 10/21/2014. Pt aware to arrive at Short Stay at 12 noon. Also informed pt she can take clear liquid diet till 8:30am day of surgery then nothing by mouth. Pt verbalized understanding.

## 2014-10-21 ENCOUNTER — Ambulatory Visit (HOSPITAL_COMMUNITY): Payer: Medicare Other | Admitting: Anesthesiology

## 2014-10-21 ENCOUNTER — Ambulatory Visit (HOSPITAL_COMMUNITY)
Admission: RE | Admit: 2014-10-21 | Discharge: 2014-10-21 | Disposition: A | Discharge status: Home / Self Care | Payer: Medicare Other | Source: Ambulatory Visit | Attending: Orthopedic Surgery | Admitting: Orthopedic Surgery

## 2014-10-21 ENCOUNTER — Encounter (HOSPITAL_COMMUNITY)
Admission: RE | Disposition: A | Discharge status: Home / Self Care | Payer: Self-pay | Source: Ambulatory Visit | Attending: Orthopedic Surgery

## 2014-10-21 ENCOUNTER — Encounter (HOSPITAL_COMMUNITY): Payer: Self-pay | Admitting: *Deleted

## 2014-10-21 DIAGNOSIS — M23261 Derangement of other lateral meniscus due to old tear or injury, right knee: Secondary | ICD-10-CM | POA: Diagnosis not present

## 2014-10-21 DIAGNOSIS — M65861 Other synovitis and tenosynovitis, right lower leg: Secondary | ICD-10-CM | POA: Insufficient documentation

## 2014-10-21 DIAGNOSIS — Z9049 Acquired absence of other specified parts of digestive tract: Secondary | ICD-10-CM | POA: Diagnosis not present

## 2014-10-21 DIAGNOSIS — M1711 Unilateral primary osteoarthritis, right knee: Secondary | ICD-10-CM | POA: Diagnosis not present

## 2014-10-21 DIAGNOSIS — M23231 Derangement of other medial meniscus due to old tear or injury, right knee: Secondary | ICD-10-CM | POA: Insufficient documentation

## 2014-10-21 DIAGNOSIS — Z886 Allergy status to analgesic agent status: Secondary | ICD-10-CM | POA: Insufficient documentation

## 2014-10-21 DIAGNOSIS — I10 Essential (primary) hypertension: Secondary | ICD-10-CM | POA: Diagnosis not present

## 2014-10-21 DIAGNOSIS — Z888 Allergy status to other drugs, medicaments and biological substances status: Secondary | ICD-10-CM | POA: Insufficient documentation

## 2014-10-21 DIAGNOSIS — Z950 Presence of cardiac pacemaker: Secondary | ICD-10-CM | POA: Insufficient documentation

## 2014-10-21 DIAGNOSIS — Z96652 Presence of left artificial knee joint: Secondary | ICD-10-CM | POA: Diagnosis not present

## 2014-10-21 DIAGNOSIS — J189 Pneumonia, unspecified organism: Secondary | ICD-10-CM | POA: Insufficient documentation

## 2014-10-21 DIAGNOSIS — I739 Peripheral vascular disease, unspecified: Secondary | ICD-10-CM | POA: Insufficient documentation

## 2014-10-21 DIAGNOSIS — I495 Sick sinus syndrome: Secondary | ICD-10-CM | POA: Insufficient documentation

## 2014-10-21 DIAGNOSIS — I480 Paroxysmal atrial fibrillation: Secondary | ICD-10-CM | POA: Diagnosis not present

## 2014-10-21 DIAGNOSIS — D649 Anemia, unspecified: Secondary | ICD-10-CM | POA: Insufficient documentation

## 2014-10-21 DIAGNOSIS — G4730 Sleep apnea, unspecified: Secondary | ICD-10-CM | POA: Diagnosis not present

## 2014-10-21 HISTORY — PX: KNEE ARTHROSCOPY WITH MEDIAL MENISECTOMY: SHX5651

## 2014-10-21 SURGERY — ARTHROSCOPY, KNEE, WITH MEDIAL MENISCECTOMY
Anesthesia: General | Site: Knee | Laterality: Right

## 2014-10-21 MED ORDER — ETOMIDATE 2 MG/ML IV SOLN
INTRAVENOUS | Status: DC | PRN
  Administered 2014-10-21: 10 mg via INTRAVENOUS

## 2014-10-21 MED ORDER — OXYCODONE HCL 5 MG PO TABS: 5.0000 mg | ORAL_TABLET | ORAL | Status: DC | PRN

## 2014-10-21 MED ORDER — LACTATED RINGERS IV SOLN
INTRAVENOUS | Status: DC
  Administered 2014-10-21 (×2): via INTRAVENOUS

## 2014-10-21 MED ORDER — LACTATED RINGERS IV SOLN
INTRAVENOUS | Status: DC
  Administered 2014-10-21: 1000 mL via INTRAVENOUS

## 2014-10-21 MED ORDER — FENTANYL CITRATE (PF) 100 MCG/2ML IJ SOLN: 25.0000 ug | INTRAMUSCULAR | Status: DC | PRN

## 2014-10-21 MED ORDER — CEFAZOLIN SODIUM-DEXTROSE 2-3 GM-% IV SOLR
2.0000 g | INTRAVENOUS | Status: AC
  Administered 2014-10-21: 2 g via INTRAVENOUS

## 2014-10-21 MED ORDER — PROMETHAZINE HCL 25 MG/ML IJ SOLN: 6.2500 mg | INTRAMUSCULAR | Status: DC | PRN

## 2014-10-21 MED ORDER — BACITRACIN ZINC 500 UNIT/GM EX OINT
TOPICAL_OINTMENT | CUTANEOUS | Status: DC | PRN
  Administered 2014-10-21: 1 via TOPICAL

## 2014-10-21 MED ORDER — PHENYLEPHRINE HCL 10 MG/ML IJ SOLN
INTRAMUSCULAR | Status: DC | PRN
  Administered 2014-10-21: 40 ug via INTRAVENOUS

## 2014-10-21 MED ORDER — OXYCODONE-ACETAMINOPHEN 10-325 MG PO TABS: 1.0000 | ORAL_TABLET | ORAL | Status: DC | PRN

## 2014-10-21 MED ORDER — CEFAZOLIN SODIUM-DEXTROSE 2-3 GM-% IV SOLR
INTRAVENOUS | Status: AC
  Filled 2014-10-21: qty 50

## 2014-10-21 MED ORDER — FENTANYL CITRATE (PF) 100 MCG/2ML IJ SOLN
INTRAMUSCULAR | Status: DC | PRN
  Administered 2014-10-21 (×2): 50 ug via INTRAVENOUS

## 2014-10-21 MED ORDER — ONDANSETRON HCL 4 MG/2ML IJ SOLN
INTRAMUSCULAR | Status: DC | PRN
  Administered 2014-10-21: 4 mg via INTRAVENOUS

## 2014-10-21 MED ORDER — OXYCODONE-ACETAMINOPHEN 5-325 MG PO TABS: 1.0000 | ORAL_TABLET | ORAL | Status: DC | PRN

## 2014-10-21 MED ORDER — PROPOFOL 10 MG/ML IV BOLUS
INTRAVENOUS | Status: DC | PRN
  Administered 2014-10-21: 100 mg via INTRAVENOUS

## 2014-10-21 MED ORDER — LACTATED RINGERS IR SOLN
Status: DC | PRN
  Administered 2014-10-21: 3000 mL

## 2014-10-21 MED ORDER — BUPIVACAINE-EPINEPHRINE (PF) 0.25% -1:200000 IJ SOLN
INTRAMUSCULAR | Status: AC
  Filled 2014-10-21: qty 30

## 2014-10-21 MED ORDER — ACETAMINOPHEN 10 MG/ML IV SOLN
1000.0000 mg | Freq: Once | INTRAVENOUS | Status: AC
  Administered 2014-10-21: 1000 mg via INTRAVENOUS
  Filled 2014-10-21: qty 100

## 2014-10-21 MED ORDER — BACITRACIN-NEOMYCIN-POLYMYXIN 400-5-5000 EX OINT
TOPICAL_OINTMENT | CUTANEOUS | Status: AC
  Filled 2014-10-21: qty 1

## 2014-10-21 MED ORDER — BUPIVACAINE-EPINEPHRINE 0.25% -1:200000 IJ SOLN
INTRAMUSCULAR | Status: DC | PRN
  Administered 2014-10-21: 30 mL

## 2014-10-21 SURGICAL SUPPLY — 31 items
BANDAGE ELASTIC 4 VELCRO ST LF (GAUZE/BANDAGES/DRESSINGS) ×3 IMPLANT
BANDAGE ELASTIC 6 VELCRO ST LF (GAUZE/BANDAGES/DRESSINGS) ×3 IMPLANT
BLADE GREAT WHITE 4.2 (BLADE) IMPLANT
BLADE GREAT WHITE 4.2MM (BLADE)
BLADE SURG SZ11 CARB STEEL (BLADE) IMPLANT
BNDG COHESIVE 6X5 TAN STRL LF (GAUZE/BANDAGES/DRESSINGS) ×3 IMPLANT
CLOTH BEACON ORANGE TIMEOUT ST (SAFETY) ×3 IMPLANT
COUNTER NEEDLE 20 DBL MAG RED (NEEDLE) ×3 IMPLANT
DRAPE SHEET LG 3/4 BI-LAMINATE (DRAPES) ×3 IMPLANT
DRSG ADAPTIC 3X8 NADH LF (GAUZE/BANDAGES/DRESSINGS) ×3 IMPLANT
DRSG KUZMA FLUFF (GAUZE/BANDAGES/DRESSINGS) ×3 IMPLANT
DRSG PAD ABDOMINAL 8X10 ST (GAUZE/BANDAGES/DRESSINGS) ×12 IMPLANT
DURAPREP 26ML APPLICATOR (WOUND CARE) ×3 IMPLANT
GAUZE SPONGE 4X4 12PLY STRL (GAUZE/BANDAGES/DRESSINGS) ×3 IMPLANT
GLOVE BIOGEL PI IND STRL 8 (GLOVE) ×1 IMPLANT
GLOVE BIOGEL PI INDICATOR 8 (GLOVE) ×2
GLOVE ECLIPSE 8.0 STRL XLNG CF (GLOVE) ×3 IMPLANT
GOWN STRL REUS W/TWL XL LVL3 (GOWN DISPOSABLE) ×3 IMPLANT
KIT BASIN OR (CUSTOM PROCEDURE TRAY) IMPLANT
MANIFOLD NEPTUNE II (INSTRUMENTS) ×3 IMPLANT
PACK ARTHROSCOPY WL (CUSTOM PROCEDURE TRAY) ×3 IMPLANT
PACK ICE MAXI GEL EZY WRAP (MISCELLANEOUS) ×3 IMPLANT
PAD MASON LEG HOLDER (PIN) ×3 IMPLANT
SET ARTHROSCOPY TUBING (MISCELLANEOUS) ×2
SET ARTHROSCOPY TUBING LN (MISCELLANEOUS) ×1 IMPLANT
SUT ETHILON 3 0 PS 1 (SUTURE) ×3 IMPLANT
TOWEL OR 17X26 10 PK STRL BLUE (TOWEL DISPOSABLE) ×3 IMPLANT
TUBING CONNECTING 10 (TUBING) IMPLANT
TUBING CONNECTING 10' (TUBING)
WAND 90 DEG TURBOVAC W/CORD (SURGICAL WAND) ×3 IMPLANT
WRAP KNEE MAXI GEL POST OP (GAUZE/BANDAGES/DRESSINGS) ×3 IMPLANT

## 2014-10-21 NOTE — Brief Op Note (Signed)
10/21/2014  2:56 PM  PATIENT:  [name]  81 y.o. female  PRE-OPERATIVE DIAGNOSIS:  RIGHT KNEE MEDIAL MENISCUS and Lateral Meniscus TEARS and Primary Osteoarthritis.  POST-OPERATIVE DIAGNOSIS:  RIGHT KNEE MEDIAL and Lateral  MENISCUS TEARS and  primary osteoarthritis  PROCEDURE:  Procedure(s): RIGHT KNEE ARTHROSCOPY WITH MEDIAL MENISECTOM,lateral menisectomy,synovectomy suprapatellar pouch (Right)  SURGEON:  Surgeon(s) and Role:    * Latanya Maudlin, MD - Primary  :   ASSISTANTS: OR Tech   ANESTHESIA:   general  EBL:  Total I/O In: 700 [I.V.:700] Out: -   BLOOD ADMINISTERED:none  DRAINS: none   LOCAL MEDICATIONS USED:  MARCAINE 30cc of 0.25% with Epinephrine     SPECIMEN:  No Specimen  DISPOSITION OF SPECIMEN:  N/A  COUNTS:  YES  TOURNIQUET:  * No tourniquets in log *  DICTATION: .Other Dictation: Dictation Number [id number]  PLAN OF CARE: Discharge to home after PACU  PATIENT DISPOSITION:  PACU - hemodynamically stable.   Delay start of Pharmacological VTE agent (>24hrs) due to surgical blood loss or risk of bleeding: yes

## 2014-10-21 NOTE — Anesthesia Procedure Notes (Signed)
Procedure Name: LMA Insertion Date/Time: 10/21/2014 2:03 PM Performed by: Johnathan Hausen A Pre-anesthesia Checklist: Patient identified, Emergency Drugs available, Suction available, Patient being monitored and Timeout performed Patient Re-evaluated:Patient Re-evaluated prior to inductionOxygen Delivery Method: Circle system utilized Preoxygenation: Pre-oxygenation with 100% oxygen Intubation Type: Combination inhalational/ intravenous induction Ventilation: Mask ventilation without difficulty LMA: LMA with gastric port inserted LMA Size: 5.0 Tube type: Oral Placement Confirmation: positive ETCO2 and breath sounds checked- equal and bilateral Tube secured with: Tape Dental Injury: Teeth and Oropharynx as per pre-operative assessment

## 2014-10-21 NOTE — Anesthesia Postprocedure Evaluation (Signed)
  Anesthesia Post-op Note  Patient: [name]  Procedure(s) Performed: Procedure(s) (LRB): RIGHT KNEE ARTHROSCOPY WITH MEDIAL MENISECTOM,lateral menisectomy,synovectomy suprapatellar pouch (Right)  Patient Location: PACU  Anesthesia Type: General  Level of Consciousness: awake and alert   Airway and Oxygen Therapy: Patient Spontanous Breathing  Post-op Pain: mild  Post-op Assessment: Post-op Vital signs reviewed, Patient's Cardiovascular Status Stable, Respiratory Function Stable, Patent Airway and No signs of Nausea or vomiting  Last Vitals:  Filed Vitals:   10/21/14 1543  BP: 147/64  Pulse: 62  Temp: 36.4 C  Resp:     Post-op Vital Signs: stable. Pacing at 60   Complications: No apparent anesthesia complications

## 2014-10-21 NOTE — Discharge Instructions (Signed)

## 2014-10-21 NOTE — Interval H&P Note (Signed)
History and Physical Interval Note:  10/21/2014 1:11 PM  [name]  has presented today for surgery, with the diagnosis of RIGHT KNEE MEDIAL MENISCUS TEAR  The various methods of treatment have been discussed with the patient and family. After consideration of risks, benefits and other options for treatment, the patient has consented to  Procedure(s): RIGHT KNEE ARTHROSCOPY WITH MEDIAL MENISECTOMY (Right) as a surgical intervention .  The patient's history has been reviewed, patient examined, no change in status, stable for surgery.  I have reviewed the patient's chart and labs.  Questions were answered to the patient's satisfaction.     Isys Tietje A

## 2014-10-21 NOTE — Progress Notes (Signed)
Called Dr Gladstone Lighter to clarify pain medication order. Patient had received IV tylenol in PACU. Oxycodone with tylenol 10/325 ordered. RN informs MD that patient should not have any tylenol after IV tylenol for 6 hours. Order received for oxycodone 5 mg PO.

## 2014-10-21 NOTE — Transfer of Care (Signed)
Immediate Anesthesia Transfer of Care Note  Patient: [name]  Procedure(s) Performed: Procedure(s): RIGHT KNEE ARTHROSCOPY WITH MEDIAL MENISECTOM,lateral menisectomy,synovectomy suprapatellar pouch (Right)  Patient Location: PACU  Anesthesia Type:General  Level of Consciousness: awake, oriented, patient cooperative, lethargic and responds to stimulation  Airway & Oxygen Therapy: Patient Spontanous Breathing and Patient connected to face mask oxygen  Post-op Assessment: Report given to RN, Post -op Vital signs reviewed and stable and Patient moving all extremities  Post vital signs: Reviewed and stable  Last Vitals:  Filed Vitals:   10/21/14 1148  BP: 156/70  Pulse: 61  Temp: 36.8 C  Resp: 18    Complications: No apparent anesthesia complications

## 2014-10-21 NOTE — Anesthesia Preprocedure Evaluation (Signed)
Anesthesia Evaluation  Patient identified by MRN, date of birth, ID band Patient awake    Reviewed: Allergy & Precautions, NPO status , Patient's Chart, lab work & pertinent test results  Airway Mallampati: II  TM Distance: >3 FB Neck ROM: Full    Dental no notable dental hx.    Pulmonary shortness of breath and with exertion, sleep apnea , pneumonia -, resolved,  breath sounds clear to auscultation  Pulmonary exam normal       Cardiovascular Exercise Tolerance: Good hypertension, Pt. on medications and Pt. on home beta blockers + angina + Peripheral Vascular Disease Normal cardiovascular exam+ dysrhythmias Atrial Fibrillation + pacemaker Rhythm:Regular Rate:Normal  Low-risk stress test 04-19-2014   Neuro/Psych negative neurological ROS  negative psych ROS   GI/Hepatic Neg liver ROS, GERD-  ,  Endo/Other  negative endocrine ROS  Renal/GU negative Renal ROS  negative genitourinary   Musculoskeletal  (+) Arthritis -,   Abdominal   Peds negative pediatric ROS (+)  Hematology  (+) anemia ,   Anesthesia Other Findings   Reproductive/Obstetrics negative OB ROS                             Anesthesia Physical Anesthesia Plan  ASA: III  Anesthesia Plan: General   Post-op Pain Management:    Induction: Intravenous  Airway Management Planned: LMA  Additional Equipment:   Intra-op Plan:   Post-operative Plan: Extubation in OR  Informed Consent: I have reviewed the patients History and Physical, chart, labs and discussed the procedure including the risks, benefits and alternatives for the proposed anesthesia with the patient or authorized representative who has indicated his/her understanding and acceptance.   Dental advisory given  Plan Discussed with: CRNA  Anesthesia Plan Comments:         Anesthesia Quick Evaluation

## 2014-10-22 ENCOUNTER — Encounter (HOSPITAL_COMMUNITY): Payer: Self-pay | Admitting: Orthopedic Surgery

## 2014-10-22 NOTE — Op Note (Signed)
NAMEDANYELLA, MCGINTY           ACCOUNT NO.:  [id number]  MEDICAL RECORD NO.:  [id number]  LOCATION:  [facility]                         FACILITY:  [facility]  PHYSICIAN:  Kipp Brood. Mathew Postiglione, M.D.DATE OF BIRTH:  [birth year]  DATE OF PROCEDURE:  10/21/2014 DATE OF DISCHARGE:  10/21/2014                              OPERATIVE REPORT   PREOPERATIVE DIAGNOSES: 1. Primary osteoarthritis of the right knee. 2. Torn lateral meniscus, right knee. 3. Torn medial meniscus, right knee.  POSTOPERATIVE DIAGNOSES: 1. Primary osteoarthritis of the right knee. 2. Torn lateral meniscus, right knee. 3. Torn medial meniscus, right knee.  OPERATION: 1. Diagnostic arthroscopy, right knee. 2. Medial meniscectomy, right knee. 3. Lateral meniscectomy, right knee. 4. Synovectomy of suprapatellar pouch, right knee.  SURGEON:  Kipp Brood. Gladstone Lighter, M.D.  ASSISTANT:  The OR nurse.  DESCRIPTION OF PROCEDURE:  Under general anesthesia, routine orthopedic prepping and draping of the right lower extremity was carried out.  The appropriate time-out was first carried out.  I also marked the appropriate right side in the holding area.  With the right leg in the knee holder, a small punctate incision was made in the suprapatellar pouch, inflow cannula was inserted, knee was distended with saline. Another small punctate incision was made in the anterolateral joint. The arthroscope was entered from lateral approach and a complete diagnostic arthroscopy was carried out.  I went up into the suprapatellar pouch.  She had severe chronic synovitis.  I introduced the ArthroCare and did extensive synovectomy.  I then went down into the medial joint, she had severe highlighted primary osteoarthritic changes. The medial meniscus had a complex tear, and I did a medial meniscectomy. The cruciates were intact.  I crossed over the lateral joint.  She had a severe complex tear of the lateral meniscus with severe synovitis.  I did a  synovectomy and partial lateral meniscectomy.  I thoroughly irrigated out the knee, removed all the fluid, closed all 3 punctate incisions with 3-0 nylon suture.  I injected 30 mL of 0.25% Marcaine with epinephrine in the knee joint and a sterile Neosporin bundle dressing was applied.  Postop, she will go back on her anticoagulant and she will be on a walker and partial weightbearing, and I will put her on a course of Percocet for pain, and she will see me in 10-12 days or prior to that in the office.          ______________________________ Kipp Brood. Gladstone Lighter, M.D.     RAG/MEDQ  D:  10/21/2014  T:  10/22/2014  Job:  [id number]

## 2014-11-13 ENCOUNTER — Telehealth: Payer: Self-pay | Admitting: *Deleted

## 2014-11-13 NOTE — Telephone Encounter (Signed)
Merlin alert shows RV auto capture at high output. Pt's mode switch burden has increased to 6.1%, pt takes eliquis as directed. Due to inc in afib burden, I suspect RV auto cap cannot confirm capture due to RVR. Appt made w/ [facility] 12/04/14 to turn off RV auto capture.

## 2014-11-17 ENCOUNTER — Other Ambulatory Visit (HOSPITAL_COMMUNITY)
Admission: RE | Admit: 2014-11-17 | Discharge: 2014-11-17 | Disposition: A | Discharge status: Home / Self Care | Payer: Medicare Other | Source: Ambulatory Visit | Attending: Adult Health | Admitting: Adult Health

## 2014-11-17 ENCOUNTER — Encounter: Payer: Self-pay | Admitting: Adult Health

## 2014-11-17 ENCOUNTER — Ambulatory Visit (INDEPENDENT_AMBULATORY_CARE_PROVIDER_SITE_OTHER): Payer: Medicare Other | Admitting: Adult Health

## 2014-11-17 ENCOUNTER — Other Ambulatory Visit (HOSPITAL_COMMUNITY): Admission: RE | Admit: 2014-11-17 | Payer: Self-pay | Source: Ambulatory Visit | Admitting: Adult Health

## 2014-11-17 VITALS — BP 122/72 | HR 75 | Ht 66.0 in | Wt 178.8 lb

## 2014-11-17 DIAGNOSIS — I4891 Unspecified atrial fibrillation: Secondary | ICD-10-CM | POA: Diagnosis not present

## 2014-11-17 DIAGNOSIS — I10 Essential (primary) hypertension: Secondary | ICD-10-CM

## 2014-11-17 DIAGNOSIS — R001 Bradycardia, unspecified: Secondary | ICD-10-CM | POA: Diagnosis present

## 2014-11-17 LAB — BASIC METABOLIC PANEL
ANION GAP: 9 (ref 5–15)
BUN: 12 mg/dL (ref 6–20)
CALCIUM: 9.8 mg/dL (ref 8.9–10.3)
CO2: 29 mmol/L (ref 22–32)
CREATININE: 0.77 mg/dL (ref 0.44–1.00)
Chloride: 102 mmol/L (ref 101–111)
GFR calc Af Amer: >60 mL/min (ref >60)
GFR calc non Af Amer: >60 mL/min (ref >60)
Glucose, Bld: 108 mg/dL — ABNORMAL HIGH (ref 65–99)
Potassium: 4.2 mmol/L (ref 3.5–5.1)
Sodium: 140 mmol/L (ref 135–145)

## 2014-11-17 LAB — CBC WITH DIFFERENTIAL/PLATELET
Basophils Absolute: 0 10*3/uL (ref 0.0–0.1)
Basophils Relative: 0 % (ref 0–1)
EOS ABS: 0.2 10*3/uL (ref 0.0–0.7)
EOS PCT: 3 % (ref 0–5)
HCT: 38.3 % (ref 36.0–46.0)
Hemoglobin: 12.3 g/dL (ref 12.0–15.0)
Lymphocytes Relative: 38 % (ref 12–46)
Lymphs Abs: 2.4 10*3/uL (ref 0.7–4.0)
MCH: 32.5 pg (ref 26.0–34.0)
MCHC: 32.1 g/dL (ref 30.0–36.0)
MCV: 101.3 fL — ABNORMAL HIGH (ref 78.0–100.0)
Monocytes Absolute: 0.9 10*3/uL (ref 0.1–1.0)
Monocytes Relative: 13 % — ABNORMAL HIGH (ref 3–12)
NEUTROS PCT: 46 % (ref 43–77)
Neutro Abs: 2.9 10*3/uL (ref 1.7–7.7)
Platelets: 253 10*3/uL (ref 150–400)
RBC: 3.78 MIL/uL — AB (ref 3.87–5.11)
RDW: 14.5 % (ref 11.5–15.5)
WBC: 6.4 10*3/uL (ref 4.0–10.5)

## 2014-11-17 NOTE — Patient Instructions (Signed)
Your physician recommends that you schedule a follow-up appointment with Dr. Domenic Polite  Your physician recommends that you continue on your current medications as directed. Please refer to the Current Medication list given to you today.  Your physician recommends that you return for lab work in: ([place], BMET)   Thank you for choosing [facility]!

## 2014-11-17 NOTE — Progress Notes (Deleted)
Name: [name]    DOB: 1929-02-03  Age: 81 y.o.  MR#: [id number]       PCP:  Sherrie Mustache, MD      Insurance: Payor: BLUE CROSS BLUE SHIELD MEDICARE / Plan: BCBS MEDICARE / Product Type: *No Product type* /   CC:    Chief Complaint  Patient presents with  . Bradycardia  . Atrial Fibrillation    PAF  . Hypertension    VS Filed Vitals:   11/17/14 1534  BP: 122/72  Pulse: 75  Height: 5\' 6"  (1.676 m)  Weight: 178 lb 12.8 oz (81.103 kg)  SpO2: 97%    Weights Current Weight  11/17/14 178 lb 12.8 oz (81.103 kg)  10/21/14 185 lb (83.915 kg)  09/11/14 174 lb (78.926 kg)    Blood Pressure  BP Readings from Last 3 Encounters:  11/17/14 122/72  10/21/14 153/79  09/11/14 122/80     Admit date:  (Not on file) Last encounter with RMR:  Visit date not found   Allergy Iodinated diagnostic agents and Naproxen  Current Outpatient Prescriptions  Medication Sig Dispense Refill  . apixaban (ELIQUIS) 5 MG TABS tablet Take 1 tablet (5 mg total) by mouth 2 (two) times daily. 60 tablet 6  . bimatoprost (LUMIGAN) 0.01 % SOLN Place 1 drop into both eyes at bedtime.    . cyanocobalamin (,VITAMIN B-12,) 1000 MCG/ML injection Inject 1,000 mcg into the muscle every 30 (thirty) days.      Marland Kitchen latanoprost (XALATAN) 0.005 % ophthalmic solution Place 1 drop into both eyes at bedtime.    . Multiple Vitamin (MULTIVITAMIN) tablet Take 1 tablet by mouth daily.      . nadolol (CORGARD) 40 MG tablet TAKE ONE TABLET BY MOUTH DAILY WITH BREAKFAST 30 tablet 6  . nitroGLYCERIN (NITROSTAT) 0.4 MG SL tablet Place 1 tablet (0.4 mg total) under the tongue every 5 (five) minutes as needed for chest pain. 25 tablet 3  . PACERONE 200 MG tablet TAKE 1/2 TABLET (100MG ) BY MOUTH ONCE A DAY EVERY EVENING 30 tablet 4   No current facility-administered medications for this visit.    Discontinued Meds:    Medications Discontinued During This Encounter  Medication Reason  . calcium-vitamin D (OSCAL  WITH D) 500-200 MG-UNIT per tablet Error  . oxyCODONE-acetaminophen (PERCOCET) 10-325 MG per tablet Error    Patient Active Problem List   Diagnosis Date Noted  . Chest pain 04/18/2014  . Addison anemia 12/08/2013  . A-fib 12/08/2013  . Sick sinus syndrome 03/22/2011  . Hyperlipidemia 02/02/2009  . Essential hypertension, benign 02/02/2009  . Paroxysmal atrial fibrillation 05/15/2008  . COLONIC POLYPS, ADENOMATOUS, HX OF 05/15/2008    LABS    Component Value Date/Time   NA 142 10/16/2014 1330   NA 139 04/19/2014 0250   NA 141 04/18/2014 1035   K 4.2 10/16/2014 1330   K 4.2 04/19/2014 0250   K 4.7 04/18/2014 1035   CL 106 10/16/2014 1330   CL 102 04/19/2014 0250   CL 103 04/18/2014 1035   CO2 28 10/16/2014 1330   CO2 25 04/19/2014 0250   CO2 25 04/18/2014 1035   GLUCOSE 101* 10/16/2014 1330   GLUCOSE 102* 04/19/2014 0250   GLUCOSE 107* 04/18/2014 1035   BUN 11 10/16/2014 1330   BUN 9 04/19/2014 0250   BUN 11 04/18/2014 1035   CREATININE 0.55 10/16/2014 1330   CREATININE 0.64 04/19/2014 0250   CREATININE 0.61 04/18/2014 1035   CREATININE 0.75 08/13/2013 1017  CALCIUM 9.4 10/16/2014 1330   CALCIUM 9.3 04/19/2014 0250   CALCIUM 8.9 04/18/2014 1035   GFRNONAA >60 10/16/2014 1330   GFRNONAA 81* 04/19/2014 0250   GFRNONAA 82* 04/18/2014 1035   GFRAA >60 10/16/2014 1330   GFRAA >90 04/19/2014 0250   GFRAA >90 04/18/2014 1035   CMP     Component Value Date/Time   NA 142 10/16/2014 1330   K 4.2 10/16/2014 1330   CL 106 10/16/2014 1330   CO2 28 10/16/2014 1330   GLUCOSE 101* 10/16/2014 1330   BUN 11 10/16/2014 1330   CREATININE 0.55 10/16/2014 1330   CREATININE 0.75 08/13/2013 1017   CALCIUM 9.4 10/16/2014 1330   PROT 6.8 10/16/2014 1330   ALBUMIN 3.6 10/16/2014 1330   AST 15 10/16/2014 1330   ALT 13* 10/16/2014 1330   ALKPHOS 57 10/16/2014 1330   BILITOT 0.7 10/16/2014 1330   GFRNONAA >60 10/16/2014 1330   GFRAA >60 10/16/2014 1330       Component  Value Date/Time   WBC 5.0 10/16/2014 1330   WBC 4.4 04/19/2014 0250   WBC 4.3 04/18/2014 1035   HGB 11.3* 10/16/2014 1330   HGB 10.5* 04/19/2014 0250   HGB 10.7* 04/18/2014 1035   HCT 35.0* 10/16/2014 1330   HCT 31.4* 04/19/2014 0250   HCT 32.1* 04/18/2014 1035   MCV 100.9* 10/16/2014 1330   MCV 98.1 04/19/2014 0250   MCV 97.3 04/18/2014 1035    Lipid Panel  No results found for: CHOL, TRIG, HDL, CHOLHDL, VLDL, LDLCALC, LDLDIRECT  ABG    Component Value Date/Time   TCO2 24 12/07/2008 1425     Lab Results  Component Value Date   TSH 0.980 ***Test methodology is 3rd generation TSH*** 12/08/2008   BNP (last 3 results) No results for input(s): BNP in the last 8760 hours.  ProBNP (last 3 results)  Recent Labs  04/18/14 1035  PROBNP 971.7*    Cardiac Panel (last 3 results) No results for input(s): CKTOTAL, CKMB, TROPONINI, RELINDX in the last 72 hours.  Iron/TIBC/Ferritin/ %Sat No results found for: IRON, TIBC, FERRITIN, IRONPCTSAT   EKG Orders placed or performed during the hospital encounter of 04/18/14  . EKG 12-Lead  . EKG 12-Lead  . EKG 12-Lead (at 6am)  . EKG 12-Lead  . EKG 12-Lead  . EKG 12-Lead (at 6am)  . EKG  . Exercise Tolerance Test  . Exercise Tolerance Test     Prior Assessment and Plan Problem List as of 11/17/2014    Hyperlipidemia   Last Assessment & Plan 12/26/2013 Office Visit Written 12/26/2013  2:03 PM by Satira Sark, MD    Now on Zetia with history of statin intolerance.      Essential hypertension, benign   Last Assessment & Plan 04/15/2013 Office Visit Written 04/15/2013 10:06 AM by Satira Sark, MD    No change to current regimen.      Paroxysmal atrial fibrillation   Last Assessment & Plan 06/15/2014 Office Visit Written 06/15/2014  3:11 PM by Satira Sark, MD    Controlled on current regimen including amiodarone and Eliquis. Lab work within the last 6 months reviewed.      COLONIC POLYPS, ADENOMATOUS, HX OF   Sick  sinus syndrome   Last Assessment & Plan 06/15/2014 Office Visit Written 06/15/2014  3:11 PM by Satira Sark, MD    Status post [employer] pacemaker, followed in the device clinic.      Addison anemia   A-fib  Chest pain       Imaging: No results found.

## 2014-11-17 NOTE — Progress Notes (Signed)
Cardiology Office Note   Date:  11/17/2014   ID:  [name], DOB 04-15-1929, MRN [id number]  PCP:  Sherrie Mustache, MD  Cardiologist:  [place]/ Jory Sims, NP   Chief Complaint  Patient presents with  . Bradycardia  . Atrial Fibrillation    PAF  . Hypertension      History of Present Illness: [name] is a 81 y.o. female who presents for ongoing assessment and management of PAF, CHADS VASC Score of 3, hypertension, and hypercholesterolemia. She was last seen by Dr. Domenic Polite in January of 2016 and was stable. She recently had a diagnostic arthroscopy of the right knee and was found to have a torn lateral meniscus, and torn medial meniscus. She underwent medial meniscectomy and lateral meniscectomy  Of hte right knee on 5./04/2015. She was started back on anticoagulation.     She comes today with complaints of fatigue. She states that when she had her surgery, she had a recurrence of atrial fib. She states her HR gets up into the 70's and she feels bad. She had her PPM checked and was found to be WNL. She continues to heal from recent knee surgery and is home with walker. She denies chest pain, DOE, or dizziness. She is concerned that the atrial fib is making her feel bad and wanted to be checked out.   Past Medical History  Diagnosis Date  . Arthritis   . Glaucoma   . Diverticulosis   . Mixed hyperlipidemia   . Essential hypertension, benign   . Colon polyps   . Paroxysmal atrial fibrillation   . Atrial flutter     s/p RFA 2003  . Sick sinus syndrome     s/p PPM (SJM)  . Varicose veins   . IBS (irritable bowel syndrome)   . Osteoporosis   . Breast cancer     1980's - lumpectomy only  . Anginal pain     CPs comes and goes uses nitro to relief no precipitating factors as stated per pt   . Dysrhythmia   . Presence of permanent cardiac pacemaker     09/2014 per Dr Rayann Heman  . DVT, lower extremity     1980's bilat   . Pneumonia     7/03, with  encephalopathy pt states had in 2010   . Shortness of breath dyspnea     climbing stairs  . Urinary tract infection     hx of   . GERD (gastroesophageal reflux disease)     controls by diet uses TUMs if needed  . Sleep apnea     STOP BANG score 5; stop bang tool results per PAT visit 10/16/2014 4; results sent to PCP.    Past Surgical History  Procedure Laterality Date  . Cholecystectomy    . Pacemaker insertion  2003  . Breast lumpectomy      Right breast   . Colonoscopy w/ biopsies and polypectomy  03/2003, 05/2008, 02/20/2011    severe diverticulosis, tubulovillous adenoma polyp, internal and external hemorrhoids 2012: severe diverticulosis, 4 mm polyp, hemorrhoids  . Cataracts    . Fractured wrist      repair  . Total knee arthroplasty  12/20/2011    Procedure: TOTAL KNEE ARTHROPLASTY;  Surgeon: Tobi Bastos, MD;  Location: [facility];  Service: Orthopedics;  Laterality: Left;  . Pacemaker generator change  06/18/12    SJM [employer] DR RF, Dr Rayann Heman  . Cardiac catheterization  1999    Normal coronaries  .  Permanent pacemaker generator change N/A 06/18/2012    Procedure: PERMANENT PACEMAKER GENERATOR CHANGE;  Surgeon: Thompson Grayer, MD;  Location: [facility] CATH LAB;  Service: Cardiovascular;  Laterality: N/A;  . Insert / replace / remove pacemaker    . Dilation and curettage of uterus    . Eye surgery    . Knee arthroscopy with medial menisectomy Right 10/21/2014    Procedure: RIGHT KNEE ARTHROSCOPY WITH MEDIAL MENISECTOM,lateral menisectomy,synovectomy suprapatellar pouch;  Surgeon: Latanya Maudlin, MD;  Location: [facility];  Service: Orthopedics;  Laterality: Right;     Current Outpatient Prescriptions  Medication Sig Dispense Refill  . apixaban (ELIQUIS) 5 MG TABS tablet Take 1 tablet (5 mg total) by mouth 2 (two) times daily. 60 tablet 6  . bimatoprost (LUMIGAN) 0.01 % SOLN Place 1 drop into both eyes at bedtime.    . cyanocobalamin (,VITAMIN B-12,) 1000 MCG/ML injection Inject 1,000 mcg  into the muscle every 30 (thirty) days.      Marland Kitchen latanoprost (XALATAN) 0.005 % ophthalmic solution Place 1 drop into both eyes at bedtime.    . Multiple Vitamin (MULTIVITAMIN) tablet Take 1 tablet by mouth daily.      . nadolol (CORGARD) 40 MG tablet TAKE ONE TABLET BY MOUTH DAILY WITH BREAKFAST 30 tablet 6  . nitroGLYCERIN (NITROSTAT) 0.4 MG SL tablet Place 1 tablet (0.4 mg total) under the tongue every 5 (five) minutes as needed for chest pain. 25 tablet 3  . PACERONE 200 MG tablet TAKE 1/2 TABLET (100MG ) BY MOUTH ONCE A DAY EVERY EVENING 30 tablet 4   No current facility-administered medications for this visit.    Allergies:   Iodinated diagnostic agents and Naproxen    Social History:  The patient  reports that she has never smoked. She has never used smokeless tobacco. She reports that she does not drink alcohol or use illicit drugs.   Family History:  The patient's family history includes Cancer in her father; Colon cancer in her brother and other; Heart attack in an other family member; Stroke in her mother.    ROS: .   All other systems are reviewed and negative.Unless otherwise mentioned in H&P above.   PHYSICAL EXAM: VS:  BP 122/72 mmHg  Pulse 75  Ht 5\' 6"  (1.676 m)  Wt 178 lb 12.8 oz (81.103 kg)  BMI 28.87 kg/m2  SpO2 97% , BMI Body mass index is 28.87 kg/(m^2). GEN: Well nourished, well developed, in no acute distress HEENT: normal Neck: no JVD, carotid bruits, or masses Cardiac:RRR; occasional extra systole no murmurs, rubs, or gallops,no edema  Respiratory:  clear to auscultation bilaterally, normal work of breathing GI: soft, nontender, nondistended, + BS MS: no deformity or atrophy Skin: warm and dry, no rash Neuro:  Strength and sensation are intact Psych: euthymic mood, full affect   MWU:XLKGM sensing with intrinsic beats, rate of 70 bpm  Recent Labs: 04/18/2014: Pro B Natriuretic peptide (BNP) 971.7* 10/16/2014: ALT 13*; BUN 11; Creatinine 0.55; Hemoglobin  11.3*; Platelets 253; Potassium 4.2; Sodium 142      Wt Readings from Last 3 Encounters:  11/17/14 178 lb 12.8 oz (81.103 kg)  10/21/14 185 lb (83.915 kg)  09/11/14 174 lb (78.926 kg)      Other studies Reviewed: Additional studies/ records that were reviewed today include:None Review of the above records demonstrates: NA   ASSESSMENT AND PLAN:  1.Atrial fibrillation: She is rate controlled on nadolol 40 mg daily and pacerone. She has no evidence of bleeding or decreased O2  Sat. I will check a CBC to evaluate for anemia causing her fatigue post operatively on anticoagulation therapy.   2. Hypertension: She is currently doing well No evidence of hypotension on this visit. No changes in her regimen. BMET is ordered.  3. S/P knee surgery; Still sore and undergoing PT. Follow up per PCP and orthopedic surgeon    Current medicines are reviewed at length with the patient today.    Labs/ tests ordered today include: BMET and CBC No orders of the defined types were placed in this encounter.     Disposition:   FU with previously scheduled appt in July,  Signed, Jory Sims, NP  11/17/2014 3:50 PM    [facility] [address] [place], [place] [address] Phone: [phone]; Fax: [phone]

## 2014-11-18 ENCOUNTER — Telehealth: Payer: Self-pay

## 2014-11-18 NOTE — Telephone Encounter (Signed)
-----   Message from Lendon Colonel, NP sent at 11/18/2014  9:41 AM EDT ----- NO signs of anemia or dehydration to explain symptoms of fatigue. She needs to continue recovery from recent knee surgery. She has appt with Dr. Lovena Le in July. She needs to keep it.

## 2014-11-18 NOTE — Telephone Encounter (Signed)
Patient made aware of Ms Lawrences's message

## 2014-12-04 ENCOUNTER — Ambulatory Visit (INDEPENDENT_AMBULATORY_CARE_PROVIDER_SITE_OTHER): Payer: Medicare Other | Admitting: *Deleted

## 2014-12-04 DIAGNOSIS — I480 Paroxysmal atrial fibrillation: Secondary | ICD-10-CM | POA: Diagnosis not present

## 2014-12-04 DIAGNOSIS — I495 Sick sinus syndrome: Secondary | ICD-10-CM | POA: Diagnosis not present

## 2014-12-04 LAB — CUP PACEART INCLINIC DEVICE CHECK
Battery Voltage: 2.96 V
Brady Statistic RV Percent Paced: 27 %
Date Time Interrogation Session: 20160624104242
Lead Channel Impedance Value: 512.5 Ohm
Lead Channel Pacing Threshold Amplitude: 1 V
Lead Channel Sensing Intrinsic Amplitude: 0.3 mV
Lead Channel Sensing Intrinsic Amplitude: 12 mV
Lead Channel Setting Pacing Pulse Width: 0.5 ms
MDC IDC MSMT BATTERY REMAINING LONGEVITY: 93.6 mo
MDC IDC MSMT LEADCHNL RA IMPEDANCE VALUE: 375 Ohm
MDC IDC MSMT LEADCHNL RV PACING THRESHOLD PULSEWIDTH: 0.5 ms
MDC IDC SET LEADCHNL RA PACING AMPLITUDE: 2 V
MDC IDC SET LEADCHNL RV PACING AMPLITUDE: 2.5 V
MDC IDC SET LEADCHNL RV SENSING SENSITIVITY: 2 mV
MDC IDC STAT BRADY RA PERCENT PACED: 63 %
Pulse Gen Serial Number: 7438567

## 2014-12-04 NOTE — Progress Notes (Signed)
Pacemaker check in clinic to turn off RV autocapture r/t high output pacing alert on Merlin. Normal device function. RV threshold 1V@0 .65ms today- output set at 2.5V@0 .54ms. 4,579 mode switches (34% +eliquis)- longest >19days, pk V 114. No high ventricular rates noted. Device programmed at appropriate safety margins. Histogram distribution appropriate for patient activity level. Device programmed to optimize intrinsic conduction. Estimated longevity 7.8-9.1 years. Patient enrolled in remote follow-up. Merlin 12/15/14, ROV with JA/[facility] in April 2017.

## 2014-12-15 ENCOUNTER — Ambulatory Visit (INDEPENDENT_AMBULATORY_CARE_PROVIDER_SITE_OTHER): Payer: Medicare Other | Admitting: *Deleted

## 2014-12-15 ENCOUNTER — Encounter: Payer: Self-pay | Admitting: Internal Medicine

## 2014-12-15 ENCOUNTER — Ambulatory Visit (INDEPENDENT_AMBULATORY_CARE_PROVIDER_SITE_OTHER): Payer: Medicare Other | Admitting: Cardiology

## 2014-12-15 ENCOUNTER — Encounter: Payer: Self-pay | Admitting: Cardiology

## 2014-12-15 VITALS — BP 118/82 | HR 70 | Ht 66.0 in | Wt 172.0 lb

## 2014-12-15 DIAGNOSIS — I4819 Other persistent atrial fibrillation: Secondary | ICD-10-CM

## 2014-12-15 DIAGNOSIS — I481 Persistent atrial fibrillation: Secondary | ICD-10-CM | POA: Diagnosis not present

## 2014-12-15 DIAGNOSIS — I480 Paroxysmal atrial fibrillation: Secondary | ICD-10-CM | POA: Diagnosis not present

## 2014-12-15 DIAGNOSIS — I495 Sick sinus syndrome: Secondary | ICD-10-CM

## 2014-12-15 DIAGNOSIS — I10 Essential (primary) hypertension: Secondary | ICD-10-CM | POA: Diagnosis not present

## 2014-12-15 MED ORDER — AMIODARONE HCL 200 MG PO TABS: 200.0000 mg | ORAL_TABLET | Freq: Every day | ORAL | Status: DC

## 2014-12-15 NOTE — Progress Notes (Signed)
Cardiology Office Note  Date: 12/15/2014   ID: HEDDA CRUMBLEY, DOB 1929/01/22, MRN [id number]  PCP: Sherrie Mustache, MD  Primary Cardiologist: Rozann Lesches, MD   Chief Complaint  Patient presents with  . Atrial Fibrillation    History of Present Illness: [name] is an 81 y.o. female last seen by Ms. Lawrence NP in early June, I saw her back in January. She has had recent problems with persistent atrial fibrillation. Recent device check showed 34% mode switches and prolonged episodes. She tells me that she feels better in general over the last month. Still having trouble with chronic knee pain and may need to have a total knee replacement.  We reviewed her medications. Follow-up ECG shows atrial fibrillation, although she was not aware of any sense of palpitations today. We discussed increasing her amiodarone from 100 mg daily to 200 mg daily. Since it is not clear that she has been in persistent atrial fibrillation the entire time, and has felt better, we will hold off on considering cardioversion for now.   Past Medical History  Diagnosis Date  . Arthritis   . Glaucoma   . Diverticulosis   . Mixed hyperlipidemia   . Essential hypertension, benign   . Colon polyps   . Paroxysmal atrial fibrillation   . Atrial flutter     s/p RFA 2003  . Sick sinus syndrome     s/p PPM (SJM)  . Varicose veins   . IBS (irritable bowel syndrome)   . Osteoporosis   . Breast cancer     1980's - lumpectomy only  . DVT, lower extremity     1980's bilateral  . History of pneumonia   . Urinary tract infection   . GERD (gastroesophageal reflux disease)   . Sleep apnea     STOP BANG score 5; stop bang tool results per PAT visit 10/16/2014 4; results sent to PCP.    Past Surgical History  Procedure Laterality Date  . Cholecystectomy    . Pacemaker insertion  2003  . Breast lumpectomy      Right breast   . Colonoscopy w/ biopsies and polypectomy  03/2003, 05/2008,  02/20/2011    severe diverticulosis, tubulovillous adenoma polyp, internal and external hemorrhoids 2012: severe diverticulosis, 4 mm polyp, hemorrhoids  . Cataracts    . Fractured wrist      repair  . Total knee arthroplasty  12/20/2011    Procedure: TOTAL KNEE ARTHROPLASTY;  Surgeon: Tobi Bastos, MD;  Location: [facility];  Service: Orthopedics;  Laterality: Left;  . Pacemaker generator change  06/18/12    SJM [employer] DR RF, Dr Rayann Heman  . Cardiac catheterization  1999    Normal coronaries  . Permanent pacemaker generator change N/A 06/18/2012    Procedure: PERMANENT PACEMAKER GENERATOR CHANGE;  Surgeon: Thompson Grayer, MD;  Location: [facility] CATH LAB;  Service: Cardiovascular;  Laterality: N/A;  . Insert / replace / remove pacemaker    . Dilation and curettage of uterus    . Eye surgery    . Knee arthroscopy with medial menisectomy Right 10/21/2014    Procedure: RIGHT KNEE ARTHROSCOPY WITH MEDIAL MENISECTOM,lateral menisectomy,synovectomy suprapatellar pouch;  Surgeon: Latanya Maudlin, MD;  Location: [facility];  Service: Orthopedics;  Laterality: Right;    Current Outpatient Prescriptions  Medication Sig Dispense Refill  . apixaban (ELIQUIS) 5 MG TABS tablet Take 1 tablet (5 mg total) by mouth 2 (two) times daily. 60 tablet 6  . bimatoprost (LUMIGAN) 0.01 %  SOLN Place 1 drop into both eyes at bedtime.    . cyanocobalamin (,VITAMIN B-12,) 1000 MCG/ML injection Inject 1,000 mcg into the muscle every 30 (thirty) days.      Marland Kitchen latanoprost (XALATAN) 0.005 % ophthalmic solution Place 1 drop into both eyes at bedtime.    . Multiple Vitamin (MULTIVITAMIN) tablet Take 1 tablet by mouth daily.      . nadolol (CORGARD) 40 MG tablet TAKE ONE TABLET BY MOUTH DAILY WITH BREAKFAST 30 tablet 6  . nitroGLYCERIN (NITROSTAT) 0.4 MG SL tablet Place 1 tablet (0.4 mg total) under the tongue every 5 (five) minutes as needed for chest pain. 25 tablet 3  . PACERONE 200 MG tablet TAKE 1/2 TABLET (100MG ) BY MOUTH ONCE A DAY EVERY  EVENING 30 tablet 4   No current facility-administered medications for this visit.    Allergies:  Iodinated diagnostic agents and Naproxen   Social History: The patient  reports that she has never smoked. She has never used smokeless tobacco. She reports that she does not drink alcohol or use illicit drugs.   ROS:  Please see the history of present illness. Otherwise, complete review of systems is positive for bilateral knee pain.  All other systems are reviewed and negative.   Physical Exam: VS:  BP 118/82 mmHg  Pulse 70  Ht 5\' 6"  (1.676 m)  Wt 172 lb (78.019 kg)  BMI 27.77 kg/m2  SpO2 99%, BMI Body mass index is 27.77 kg/(m^2).  Wt Readings from Last 3 Encounters:  12/15/14 172 lb (78.019 kg)  11/17/14 178 lb 12.8 oz (81.103 kg)  10/21/14 185 lb (83.915 kg)     Comfortable at rest.  HEENT: Conjunctiva and lids normal, oropharynx clear.  Neck: Supple, no carotid bruits, no elevated jugular venous pressure.  Lungs: Clear to auscultation, nonlabored.  Cardiac: Irregular rhythm, no significant systolic murmur or S3..  Abdomen: Soft, bowel sounds present.  Extremities: No pitting. Varicose veins noted.  Musculoskeletal: No kyphosis.  Skin: Warm and dry.  Neuropsychiatric: Alert and oriented x3, affect appropriate.   ECG: ECG is ordered today and shows atrial fibrillation, rate controlled with two ventricular paced beats.   Recent Labwork:  09/03/2014: BUN 21, creatinine 0.8, potassium 4.6, AST 15, ALT 14, hemoglobin 14.2, platelets 282  Assessment and Plan:  1. Paroxysmal to persistent atrial fibrillation. Continue current regimen with increase in amiodarone to 200 mg daily. Will follow-up LFTs and TSH for her next visit. Keep interval follow-up with Dr. Rayann Heman.  2. St. Jude pacemaker in place, followed by Dr. Rayann Heman.  3. Essential hypertension, blood pressure is normal today.  Current medicines were reviewed with the patient today.   Orders Placed This  Encounter  Procedures  . Hepatic function panel  . TSH  . EKG 12-Lead    Disposition: FU with me in 3 months.   Signed, Satira Sark, MD, [facility] 12/15/2014 3:57 PM    [facility] [place], [place], [place] [address] Phone: [phone]; Fax: [phone]

## 2014-12-15 NOTE — Patient Instructions (Signed)
Your physician has recommended you make the following change in your medication:  Increase your amiodarone to 200 mg daily. Continue all other medications the same. Your physician recommends that you have non-fasting lab work in 3 months just before your next visit to check your thyroid and liver functioning. Your lab orders have been given to your during your visit. Your physician recommends that you schedule a follow-up appointment in: 3 months. You will receive a reminder letter in the mail in about 1-2 months reminding you to call and schedule your appointment. If you don't receive this letter, please contact our office.

## 2014-12-16 NOTE — Progress Notes (Signed)
Remote pacemaker transmission.   

## 2014-12-25 LAB — CUP PACEART REMOTE DEVICE CHECK
Battery Remaining Longevity: 116 mo
Battery Voltage: 2.98 V
Brady Statistic AP VS Percent: 0 %
Brady Statistic AS VS Percent: 0 %
Brady Statistic RA Percent Paced: 6.8 %
Brady Statistic RV Percent Paced: 62 %
Date Time Interrogation Session: 20160705092230
Lead Channel Impedance Value: 350 Ohm
Lead Channel Impedance Value: 540 Ohm
Lead Channel Pacing Threshold Amplitude: 1 V
Lead Channel Pacing Threshold Pulse Width: 0.5 ms
Lead Channel Sensing Intrinsic Amplitude: 10.4 mV
Lead Channel Setting Sensing Sensitivity: 2 mV
MDC IDC MSMT BATTERY REMAINING PERCENTAGE: 95.5 %
MDC IDC MSMT LEADCHNL RA PACING THRESHOLD PULSEWIDTH: 0.5 ms
MDC IDC MSMT LEADCHNL RA SENSING INTR AMPL: 0.3 mV
MDC IDC MSMT LEADCHNL RV PACING THRESHOLD AMPLITUDE: 1 V
MDC IDC SET LEADCHNL RA PACING AMPLITUDE: 2 V
MDC IDC SET LEADCHNL RV PACING AMPLITUDE: 2.5 V
MDC IDC SET LEADCHNL RV PACING PULSEWIDTH: 0.5 ms
MDC IDC STAT BRADY AP VP PERCENT: 48 %
MDC IDC STAT BRADY AS VP PERCENT: 52 %
Pulse Gen Serial Number: 7438567

## 2015-01-05 ENCOUNTER — Telehealth: Payer: Self-pay | Admitting: *Deleted

## 2015-01-05 NOTE — Telephone Encounter (Signed)
Received fax from [facility] requesting clearance for:  Right Knee: TKA-Medial & Lateral w/wo patella resurfacing.    Patient last seen 12/15/14.

## 2015-01-06 NOTE — Telephone Encounter (Signed)
Keep form in [place] office and will address it when back next week.

## 2015-01-07 NOTE — Telephone Encounter (Signed)
Noted, form placed in Dr. Myles Gip box.

## 2015-01-12 ENCOUNTER — Other Ambulatory Visit: Payer: Self-pay | Admitting: Surgical

## 2015-01-12 MED ORDER — BUPIVACAINE LIPOSOME 1.3 % IJ SUSP
20.0000 mL | Freq: Once | INTRAMUSCULAR | Status: AC
Start: 2015-01-12 — End: ?

## 2015-01-15 ENCOUNTER — Encounter: Payer: Self-pay | Admitting: Cardiology

## 2015-01-15 ENCOUNTER — Encounter: Payer: Self-pay | Admitting: Internal Medicine

## 2015-02-02 NOTE — H&P (Signed)
TOTAL KNEE ADMISSION H&P  Patient is being admitted for right total knee arthroplasty.  Subjective:  Chief Complaint:right knee pain.  HPI: [name], 81 y.o. female, has a history of pain and functional disability in the right knee due to arthritis and has failed non-surgical conservative treatments for greater than 12 weeks to includeNSAID's and/or analgesics, corticosteriod injections and activity modification.  Onset of symptoms was gradual, starting 3 years ago with gradually worsening course since that time. The patient noted prior procedures on the knee to include  arthroscopy and menisectomy on the right knee(s).  Patient currently rates pain in the right knee(s) at 7 out of 10 with activity. Patient has night pain, worsening of pain with activity and weight bearing, pain that interferes with activities of daily living, pain with passive range of motion, crepitus and joint swelling.  Patient has evidence of periarticular osteophytes and joint space narrowing by imaging studies. There is no active infection.  Patient Active Problem List   Diagnosis Date Noted  . Chest pain 04/18/2014  . [place]ison anemia 12/08/2013  . A-fib 12/08/2013  . Sick sinus syndrome 03/22/2011  . Hyperlipidemia 02/02/2009  . Essential hypertension, benign 02/02/2009  . Paroxysmal atrial fibrillation 05/15/2008  . COLONIC POLYPS, ADENOMATOUS, HX OF 05/15/2008   Past Medical History  Diagnosis Date  . Arthritis   . Glaucoma   . Diverticulosis   . Mixed hyperlipidemia   . Essential hypertension, benign   . Colon polyps   . Paroxysmal atrial fibrillation   . Atrial flutter     s/p RFA 2003  . Sick sinus syndrome     s/p PPM (SJM)  . Varicose veins   . IBS (irritable bowel syndrome)   . Osteoporosis   . Breast cancer     1980's - lumpectomy only  . DVT, lower extremity     1980's bilateral  . History of pneumonia   . Urinary tract infection   . GERD (gastroesophageal reflux disease)   .  Sleep apnea     STOP BANG score 5; stop bang tool results per PAT visit 10/16/2014 4; results sent to PCP.    Past Surgical History  Procedure Laterality Date  . Cholecystectomy    . Pacemaker insertion  2003  . Breast lumpectomy      Right breast   . Colonoscopy w/ biopsies and polypectomy  03/2003, 05/2008, 02/20/2011    severe diverticulosis, tubulovillous adenoma polyp, internal and external hemorrhoids 2012: severe diverticulosis, 4 mm polyp, hemorrhoids  . Cataracts    . Fractured wrist      repair  . Total knee arthroplasty  12/20/2011    Procedure: TOTAL KNEE ARTHROPLASTY;  Surgeon: Tobi Bastos, MD;  Location: [facility];  Service: Orthopedics;  Laterality: Left;  . Pacemaker generator change  06/18/12    SJM [facility] DR RF, Dr Rayann Heman  . Cardiac catheterization  1999    Normal coronaries  . Permanent pacemaker generator change N/A 06/18/2012    Procedure: PERMANENT PACEMAKER GENERATOR CHANGE;  Surgeon: Thompson Grayer, MD;  Location: [facility] CATH LAB;  Service: Cardiovascular;  Laterality: N/A;  . Insert / replace / remove pacemaker    . Dilation and curettage of uterus    . Eye surgery    . Knee arthroscopy with medial menisectomy Right 10/21/2014    Procedure: RIGHT KNEE ARTHROSCOPY WITH MEDIAL MENISECTOM,lateral menisectomy,synovectomy suprapatellar pouch;  Surgeon: Latanya Maudlin, MD;  Location: [facility];  Service: Orthopedics;  Laterality: Right;    Current  outpatient prescriptions:  .  acetaminophen (TYLENOL) 500 MG tablet, Take 1,000 mg by mouth every 6 (six) hours as needed for moderate pain., Disp: , Rfl:  .  amiodarone (PACERONE) 200 MG tablet, Take 1 tablet (200 mg total) by mouth daily. (Patient taking differently: Take 100 mg by mouth 2 (two) times daily. ), Disp: 30 tablet, Rfl: 3 .  apixaban (ELIQUIS) 5 MG TABS tablet, Take 1 tablet (5 mg total) by mouth 2 (two) times daily., Disp: 60 tablet, Rfl: 6 .  cyanocobalamin (,VITAMIN B-12,) 1000 MCG/ML injection, Inject 1,000 mcg into  the muscle every 30 (thirty) days.  , Disp: , Rfl:  .  Emollient (AQUA GLYCOLIC HAND/BODY EX), Apply 1 application topically every morning., Disp: , Rfl:  .  latanoprost (XALATAN) 0.005 % ophthalmic solution, Place 1 drop into both eyes at bedtime., Disp: , Rfl:  .  Multiple Vitamin (MULTIVITAMIN) tablet, Take 1 tablet by mouth every morning. , Disp: , Rfl:  .  nadolol (CORGARD) 40 MG tablet, TAKE ONE TABLET BY MOUTH DAILY WITH BREAKFAST, Disp: 30 tablet, Rfl: 6 .  nitroGLYCERIN (NITROSTAT) 0.4 MG SL tablet, Place 1 tablet (0.4 mg total) under the tongue every 5 (five) minutes as needed for chest pain., Disp: 25 tablet, Rfl: 3 .  bimatoprost (LUMIGAN) 0.01 % SOLN, Place 1 drop into both eyes at bedtime., Disp: , Rfl:   Allergies  Allergen Reactions  . Iodinated Diagnostic Agents Hives, Itching and Rash    03/13/14: symptoms began within two hours of intrathecal injection (myelogram 03/11/14); on shoulders radiating down to stomach.  Relief with Benadryl.  Told patient she would need to pre-med with Benadryl in the future before receiving this contrast.  Brita Romp, RN  . Naproxen Hives, Itching and Rash    Social History  Substance Use Topics  . Smoking status: Never Smoker   . Smokeless tobacco: Never Used  . Alcohol Use: No    Family History  Problem Relation Age of Onset  . Colon cancer Brother   . Colon cancer Other     Grandfather  . Cancer Father   . Stroke Mother   . Heart attack      CHF grandmother     Review of Systems  Constitutional: Negative for fever, chills, weight loss, malaise/fatigue and diaphoresis.  HENT: Negative.   Respiratory: Positive for shortness of breath. Negative for cough, hemoptysis, sputum production and wheezing.        SOB with exertion  Cardiovascular: Negative.   Gastrointestinal: Positive for constipation. Negative for heartburn, nausea, vomiting, abdominal pain, diarrhea, blood in stool and melena.  Genitourinary: Negative.    Musculoskeletal: Positive for joint pain. Negative for myalgias, back pain, falls and neck pain.       Right knee pain  Skin: Negative.   Neurological: Positive for weakness. Negative for dizziness, tingling, tremors, sensory change, speech change, focal weakness, seizures and loss of consciousness.  Endo/Heme/Allergies: Negative.   Psychiatric/Behavioral: Negative.     Objective:  Physical Exam  Constitutional: She is oriented to person, place, and time. She appears well-developed and well-nourished. No distress.  HENT:  Head: Normocephalic and atraumatic.  Right Ear: External ear normal.  Left Ear: External ear normal.  Nose: Nose normal.  Mouth/Throat: Oropharynx is clear and moist.  Eyes: Conjunctivae and EOM are normal.  Neck: Normal range of motion. Neck supple.  Cardiovascular: Normal rate, normal heart sounds and intact distal pulses.  An irregularly irregular rhythm present.  No murmur heard. Respiratory:  Effort normal and breath sounds normal. No respiratory distress. She has no wheezes.  GI: Soft. Bowel sounds are normal. She exhibits no distension. There is no tenderness.  Musculoskeletal:       Right hip: Normal.       Left hip: Normal.       Right knee: She exhibits decreased range of motion and swelling. She exhibits no effusion and no erythema. Tenderness found. Medial joint line and lateral joint line tenderness noted.       Left knee: Normal.  Neurological: She is alert and oriented to person, place, and time. She has normal strength and normal reflexes. No sensory deficit.  Skin: No rash noted. She is not diaphoretic. No erythema.  Psychiatric: She has a normal mood and affect. Her behavior is normal.    Vitals  Weight: 172 lb Height: 66in Body Surface Area: 1.88 m Body Mass Index: 27.76 kg/m  Pulse: 64 (Regular)  BP: 132/74 (Sitting, Left Arm, Standard)  Imaging Review Plain radiographs demonstrate severe degenerative joint disease of the  right knee(s). The overall alignment ismild varus. The bone quality appears to be good for age and reported activity level.  Assessment/Plan:  End stage primary osteoarthritis, right knee   The patient history, physical examination, clinical judgment of the provider and imaging studies are consistent with end stage degenerative joint disease of the right knee(s) and total knee arthroplasty is deemed medically necessary. The treatment options including medical management, injection therapy arthroscopy and arthroplasty were discussed at length. The risks and benefits of total knee arthroplasty were presented and reviewed. The risks due to aseptic loosening, infection, stiffness, patella tracking problems, thromboembolic complications and other imponderables were discussed. The patient acknowledged the explanation, agreed to proceed with the plan and consent was signed. Patient is being admitted for inpatient treatment for surgery, pain control, PT, OT, prophylactic antibiotics, VTE prophylaxis, progressive ambulation and ADL's and discharge planning. The patient is planning to be discharged to skilled nursing facility    Would like to to go [facility] TXA topical PCP: Dr. Edrick Oh Cardio: Dr. Shane Crutch, PA-C

## 2015-02-02 NOTE — Progress Notes (Signed)
Dr allred pacer orders on chart Cardiac clearance note dr Domenic Polite 01-11-15 on chart lov dr Domenic Polite 12-14-13 on chart Chest xray 2 view 04-18-14 epic ekg 12-15-14 epic Last device check note 12-14-13 epic

## 2015-02-03 ENCOUNTER — Encounter (HOSPITAL_COMMUNITY): Payer: Self-pay

## 2015-02-03 ENCOUNTER — Encounter (HOSPITAL_COMMUNITY)
Admission: RE | Admit: 2015-02-03 | Discharge: 2015-02-03 | Disposition: A | Discharge status: Home / Self Care | Payer: Medicare Other | Source: Ambulatory Visit | Attending: Orthopedic Surgery | Admitting: Orthopedic Surgery

## 2015-02-03 DIAGNOSIS — Z01812 Encounter for preprocedural laboratory examination: Secondary | ICD-10-CM | POA: Insufficient documentation

## 2015-02-03 DIAGNOSIS — I4891 Unspecified atrial fibrillation: Secondary | ICD-10-CM | POA: Diagnosis not present

## 2015-02-03 DIAGNOSIS — E782 Mixed hyperlipidemia: Secondary | ICD-10-CM | POA: Diagnosis not present

## 2015-02-03 DIAGNOSIS — I10 Essential (primary) hypertension: Secondary | ICD-10-CM | POA: Diagnosis not present

## 2015-02-03 DIAGNOSIS — D510 Vitamin B12 deficiency anemia due to intrinsic factor deficiency: Secondary | ICD-10-CM | POA: Diagnosis not present

## 2015-02-03 LAB — COMPREHENSIVE METABOLIC PANEL
ALT: 13 U/L — ABNORMAL LOW (ref 14–54)
AST: 15 U/L (ref 15–41)
Albumin: 3.8 g/dL (ref 3.5–5.0)
Alkaline Phosphatase: 61 U/L (ref 38–126)
Anion gap: 8 (ref 5–15)
BUN: 13 mg/dL (ref 6–20)
CO2: 29 mmol/L (ref 22–32)
Calcium: 9.9 mg/dL (ref 8.9–10.3)
Chloride: 104 mmol/L (ref 101–111)
Creatinine, Ser: 0.83 mg/dL (ref 0.44–1.00)
GFR calc Af Amer: >60 mL/min (ref >60)
GFR calc non Af Amer: >60 mL/min (ref >60)
Glucose, Bld: 106 mg/dL — ABNORMAL HIGH (ref 65–99)
Potassium: 5 mmol/L (ref 3.5–5.1)
Sodium: 141 mmol/L (ref 135–145)
Total Bilirubin: 0.5 mg/dL (ref 0.3–1.2)
Total Protein: 7.5 g/dL (ref 6.5–8.1)

## 2015-02-03 LAB — URINALYSIS, ROUTINE W REFLEX MICROSCOPIC
Glucose, UA: NEGATIVE mg/dL
Hgb urine dipstick: NEGATIVE
Ketones, ur: NEGATIVE mg/dL
Nitrite: NEGATIVE
Protein, ur: NEGATIVE mg/dL
Specific Gravity, Urine: 1.029 (ref 1.005–1.030)
Urobilinogen, UA: 0.2 mg/dL (ref 0.0–1.0)
pH: 6 (ref 5.0–8.0)

## 2015-02-03 LAB — SURGICAL PCR SCREEN
MRSA, PCR: NEGATIVE
Staphylococcus aureus: NEGATIVE

## 2015-02-03 LAB — CBC WITH DIFFERENTIAL/PLATELET
Basophils Absolute: 0 10*3/uL (ref 0.0–0.1)
Basophils Relative: 0 % (ref 0–1)
Eosinophils Absolute: 0.1 10*3/uL (ref 0.0–0.7)
Eosinophils Relative: 2 % (ref 0–5)
HCT: 35.4 % — ABNORMAL LOW (ref 36.0–46.0)
Hemoglobin: 11.5 g/dL — ABNORMAL LOW (ref 12.0–15.0)
Lymphocytes Relative: 34 % (ref 12–46)
Lymphs Abs: 1.8 10*3/uL (ref 0.7–4.0)
MCH: 32.3 pg (ref 26.0–34.0)
MCHC: 32.5 g/dL (ref 30.0–36.0)
MCV: 99.4 fL (ref 78.0–100.0)
Monocytes Absolute: 0.6 10*3/uL (ref 0.1–1.0)
Monocytes Relative: 11 % (ref 3–12)
Neutro Abs: 2.9 10*3/uL (ref 1.7–7.7)
Neutrophils Relative %: 53 % (ref 43–77)
Platelets: 236 10*3/uL (ref 150–400)
RBC: 3.56 MIL/uL — ABNORMAL LOW (ref 3.87–5.11)
RDW: 14.7 % (ref 11.5–15.5)
WBC: 5.4 10*3/uL (ref 4.0–10.5)

## 2015-02-03 LAB — URINE MICROSCOPIC-ADD ON

## 2015-02-03 LAB — PROTIME-INR
INR: 1.36 (ref 0.00–1.49)
Prothrombin Time: 16.9 seconds — ABNORMAL HIGH (ref 11.6–15.2)

## 2015-02-03 LAB — APTT: aPTT: 36 seconds (ref 24–37)

## 2015-02-03 NOTE — Patient Instructions (Signed)
[name]  02/03/2015   Your procedure is scheduled on: Wednesday August 31st 2016  Report to [facility] Main  Entrance take [place]  elevators to 3rd floor to  [place] at 830 AM.  Call this number if you have problems the morning of surgery [phone]   Remember: ONLY 1 PERSON MAY GO WITH YOU TO SHORT STAY TO GET  READY MORNING OF [place].  Do not eat food or drink liquids :After Midnight.     Take these medicines the morning of surgery with A SIP OF WATER: Pacerone, Nadolol                               You may not have any metal on your body including hair pins and              piercings  Do not wear jewelry, make-up, lotions, powders or perfumes, deodorant             Do not wear nail polish.  Do not shave  48 hours prior to surgery.              Men may shave face and neck.   Do not bring valuables to the hospital. [place].  Contacts, dentures or bridgework may not be worn into surgery.  Leave suitcase in the car. After surgery it may be brought to your room.     Patients discharged the day of surgery will not be allowed to drive home.  Name and phone number of your driver:  Special Instructions: N/A              Please read over the following fact sheets you were given: _____________________________________________________________________             [facility] - Preparing for Surgery Before surgery, you can play an important role.  Because skin is not sterile, your skin needs to be as free of germs as possible.  You can reduce the number of germs on your skin by washing with CHG (chlorahexidine gluconate) soap before surgery.  CHG is an antiseptic cleaner which kills germs and bonds with the skin to continue killing germs even after washing. Please DO NOT use if you have an allergy to CHG or antibacterial soaps.  If your skin becomes reddened/irritated stop using the CHG and  inform your nurse when you arrive at Short Stay. Do not shave (including legs and underarms) for at least 48 hours prior to the first CHG shower.  You may shave your face/neck. Please follow these instructions carefully:  1.  Shower with CHG Soap the night before surgery and the  morning of Surgery.  2.  If you choose to wash your hair, wash your hair first as usual with your  normal  shampoo.  3.  After you shampoo, rinse your hair and body thoroughly to remove the  shampoo.                           4.  Use CHG as you would any other liquid soap.  You can apply chg directly  to the skin and wash  Gently with a scrungie or clean washcloth.  5.  Apply the CHG Soap to your body ONLY FROM THE NECK DOWN.   Do not use on face/ open                           Wound or open sores. Avoid contact with eyes, ears mouth and genitals (private parts).                       Wash face,  Genitals (private parts) with your normal soap.             6.  Wash thoroughly, paying special attention to the area where your surgery  will be performed.  7.  Thoroughly rinse your body with warm water from the neck down.  8.  DO NOT shower/wash with your normal soap after using and rinsing off  the CHG Soap.                9.  Pat yourself dry with a clean towel.            10.  Wear clean pajamas.            11.  Place clean sheets on your bed the night of your first shower and do not  sleep with pets. Day of Surgery : Do not apply any lotions/deodorants the morning of surgery.  Please wear clean clothes to the hospital/surgery center.  FAILURE TO FOLLOW THESE INSTRUCTIONS MAY RESULT IN THE CANCELLATION OF YOUR SURGERY PATIENT SIGNATURE_________________________________  NURSE SIGNATURE__________________________________  ________________________________________________________________________   [name]  An incentive spirometer is a tool that can help keep your lungs clear and  active. This tool measures how well you are filling your lungs with each breath. Taking long deep breaths may help reverse or decrease the chance of developing breathing (pulmonary) problems (especially infection) following:  A long period of time when you are unable to move or be active. BEFORE THE PROCEDURE   If the spirometer includes an indicator to show your best effort, your nurse or respiratory therapist will set it to a desired goal.  If possible, sit up straight or lean slightly forward. Try not to slouch.  Hold the incentive spirometer in an upright position. INSTRUCTIONS FOR USE   Sit on the edge of your bed if possible, or sit up as far as you can in bed or on a chair.  Hold the incentive spirometer in an upright position.  Breathe out normally.  Place the mouthpiece in your mouth and seal your lips tightly around it.  Breathe in slowly and as deeply as possible, raising the piston or the ball toward the top of the column.  Hold your breath for 3-5 seconds or for as long as possible. Allow the piston or ball to fall to the bottom of the column.  Remove the mouthpiece from your mouth and breathe out normally.  Rest for a few seconds and repeat Steps 1 through 7 at least 10 times every 1-2 hours when you are awake. Take your time and take a few normal breaths between deep breaths.  The spirometer may include an indicator to show your best effort. Use the indicator as a goal to work toward during each repetition.  After each set of 10 deep breaths, practice coughing to be sure your lungs are clear. If you have an incision (the cut made at the time of surgery),  support your incision when coughing by placing a pillow or rolled up towels firmly against it. Once you are able to get out of bed, walk around indoors and cough well. You may stop using the incentive spirometer when instructed by your caregiver.  RISKS AND COMPLICATIONS  Take your time so you do not get dizzy or  light-headed.  If you are in pain, you may need to take or ask for pain medication before doing incentive spirometry. It is harder to take a deep breath if you are having pain. AFTER USE  Rest and breathe slowly and easily.  It can be helpful to keep track of a log of your progress. Your caregiver can provide you with a simple table to help with this. If you are using the spirometer at home, follow these instructions: [place] IF:   You are having difficultly using the spirometer.  You have trouble using the spirometer as often as instructed.  Your pain medication is not giving enough relief while using the spirometer.  You develop fever of 100.5 F (38.1 C) or higher. SEEK IMMEDIATE MEDICAL CARE IF:   You cough up bloody sputum that had not been present before.  You develop fever of 102 F (38.9 C) or greater.  You develop worsening pain at or near the incision site. MAKE SURE YOU:   Understand these instructions.  Will watch your condition.  Will get help right away if you are not doing well or get worse. Document Released: 10/09/2006 Document Revised: 08/21/2011 Document Reviewed: 12/10/2006 ExitCare Patient Information 2014 Exit[employer], [name].   ________________________________________________________________________  WHAT IS A BLOOD TRANSFUSION? Blood Transfusion Information  A transfusion is the replacement of blood or some of its parts. Blood is made up of multiple cells which provide different functions.  Red blood cells carry oxygen and are used for blood loss replacement.  White blood cells fight against infection.  Platelets control bleeding.  Plasma helps clot blood.  Other blood products are available for specialized needs, such as hemophilia or other clotting disorders. BEFORE THE TRANSFUSION  Who gives blood for transfusions?   Healthy volunteers who are fully evaluated to make sure their blood is safe. This is blood bank  blood. Transfusion therapy is the safest it has ever been in the practice of medicine. Before blood is taken from a donor, a complete history is taken to make sure that person has no history of diseases nor engages in risky social behavior (examples are intravenous drug use or sexual activity with multiple partners). The donor's travel history is screened to minimize risk of transmitting infections, such as malaria. The donated blood is tested for signs of infectious diseases, such as HIV and hepatitis. The blood is then tested to be sure it is compatible with you in order to minimize the chance of a transfusion reaction. If you or a relative donates blood, this is often done in anticipation of surgery and is not appropriate for emergency situations. It takes many days to process the donated blood. RISKS AND COMPLICATIONS Although transfusion therapy is very safe and saves many lives, the main dangers of transfusion include:   Getting an infectious disease.  Developing a transfusion reaction. This is an allergic reaction to something in the blood you were given. Every precaution is taken to prevent this. The decision to have a blood transfusion has been considered carefully by your caregiver before blood is given. Blood is not given unless the benefits outweigh the risks. AFTER THE TRANSFUSION  Right after receiving a blood transfusion, you will usually feel much better and more energetic. This is especially true if your red blood cells have gotten low (anemic). The transfusion raises the level of the red blood cells which carry oxygen, and this usually causes an energy increase.  The nurse administering the transfusion will monitor you carefully for complications. HOME CARE INSTRUCTIONS  No special instructions are needed after a transfusion. You may find your energy is better. Speak with your caregiver about any limitations on activity for underlying diseases you may have. SEEK MEDICAL CARE IF:    Your condition is not improving after your transfusion.  You develop redness or irritation at the intravenous (IV) site. SEEK IMMEDIATE MEDICAL CARE IF:  Any of the following symptoms occur over the next 12 hours:  Shaking chills.  You have a temperature by mouth above 102 F (38.9 C), not controlled by medicine.  Chest, back, or muscle pain.  People around you feel you are not acting correctly or are confused.  Shortness of breath or difficulty breathing.  Dizziness and fainting.  You get a rash or develop hives.  You have a decrease in urine output.  Your urine turns a dark color or changes to pink, red, or brown. Any of the following symptoms occur over the next 10 days:  You have a temperature by mouth above 102 F (38.9 C), not controlled by medicine.  Shortness of breath.  Weakness after normal activity.  The white part of the eye turns yellow (jaundice).  You have a decrease in the amount of urine or are urinating less often.  Your urine turns a dark color or changes to pink, red, or brown. Document Released: 05/26/2000 Document Revised: 08/21/2011 Document Reviewed: 01/13/2008 [facility] Patient Information 2014 [place], [name].  _______________________________________________________________________

## 2015-02-03 NOTE — Progress Notes (Signed)
Stress test 04-19-14 epic

## 2015-02-04 NOTE — Progress Notes (Signed)
PT and urinalysis/micro results in epic per PAT visit 02/03/2015 sent to Dr Gladstone Lighter

## 2015-02-10 ENCOUNTER — Inpatient Hospital Stay (HOSPITAL_COMMUNITY): Payer: Medicare Other | Admitting: Certified Registered"

## 2015-02-10 ENCOUNTER — Encounter (HOSPITAL_COMMUNITY)
Admission: RE | Disposition: A | Discharge status: Skilled Nursing Facility | Payer: Self-pay | Source: Ambulatory Visit | Attending: Orthopedic Surgery

## 2015-02-10 ENCOUNTER — Inpatient Hospital Stay (HOSPITAL_COMMUNITY): Payer: Medicare Other

## 2015-02-10 ENCOUNTER — Inpatient Hospital Stay (HOSPITAL_COMMUNITY)
Admission: RE | Admit: 2015-02-10 | Discharge: 2015-02-12 | DRG: 470 | Disposition: A | Discharge status: Skilled Nursing Facility | Payer: Medicare Other | Source: Ambulatory Visit | Attending: Orthopedic Surgery | Admitting: Orthopedic Surgery

## 2015-02-10 ENCOUNTER — Encounter (HOSPITAL_COMMUNITY): Payer: Self-pay | Admitting: *Deleted

## 2015-02-10 DIAGNOSIS — I480 Paroxysmal atrial fibrillation: Secondary | ICD-10-CM | POA: Diagnosis not present

## 2015-02-10 DIAGNOSIS — M659 Synovitis and tenosynovitis, unspecified: Secondary | ICD-10-CM | POA: Diagnosis present

## 2015-02-10 DIAGNOSIS — E782 Mixed hyperlipidemia: Secondary | ICD-10-CM | POA: Diagnosis present

## 2015-02-10 DIAGNOSIS — K589 Irritable bowel syndrome without diarrhea: Secondary | ICD-10-CM | POA: Diagnosis present

## 2015-02-10 DIAGNOSIS — M1711 Unilateral primary osteoarthritis, right knee: Principal | ICD-10-CM | POA: Diagnosis present

## 2015-02-10 DIAGNOSIS — K219 Gastro-esophageal reflux disease without esophagitis: Secondary | ICD-10-CM | POA: Diagnosis present

## 2015-02-10 DIAGNOSIS — H409 Unspecified glaucoma: Secondary | ICD-10-CM | POA: Diagnosis present

## 2015-02-10 DIAGNOSIS — M810 Age-related osteoporosis without current pathological fracture: Secondary | ICD-10-CM | POA: Diagnosis present

## 2015-02-10 DIAGNOSIS — K5790 Diverticulosis of intestine, part unspecified, without perforation or abscess without bleeding: Secondary | ICD-10-CM | POA: Diagnosis present

## 2015-02-10 DIAGNOSIS — Z01818 Encounter for other preprocedural examination: Secondary | ICD-10-CM

## 2015-02-10 DIAGNOSIS — Z86718 Personal history of other venous thrombosis and embolism: Secondary | ICD-10-CM | POA: Diagnosis not present

## 2015-02-10 DIAGNOSIS — Z853 Personal history of malignant neoplasm of breast: Secondary | ICD-10-CM

## 2015-02-10 DIAGNOSIS — I10 Essential (primary) hypertension: Secondary | ICD-10-CM | POA: Diagnosis not present

## 2015-02-10 DIAGNOSIS — I8390 Asymptomatic varicose veins of unspecified lower extremity: Secondary | ICD-10-CM | POA: Diagnosis present

## 2015-02-10 DIAGNOSIS — Z96659 Presence of unspecified artificial knee joint: Secondary | ICD-10-CM

## 2015-02-10 DIAGNOSIS — I4892 Unspecified atrial flutter: Secondary | ICD-10-CM | POA: Diagnosis present

## 2015-02-10 HISTORY — PX: TOTAL KNEE ARTHROPLASTY: SHX125

## 2015-02-10 LAB — TYPE AND SCREEN
ABO/RH(D): A POS
Antibody Screen: NEGATIVE

## 2015-02-10 SURGERY — ARTHROPLASTY, KNEE, TOTAL
Anesthesia: General | Site: Knee | Laterality: Right

## 2015-02-10 MED ORDER — LACTATED RINGERS IV SOLN
INTRAVENOUS | Status: DC
  Administered 2015-02-10 – 2015-02-11 (×2): via INTRAVENOUS

## 2015-02-10 MED ORDER — BUPIVACAINE HCL (PF) 0.25 % IJ SOLN
INTRAMUSCULAR | Status: DC | PRN
  Administered 2015-02-10: 20 mL

## 2015-02-10 MED ORDER — THROMBIN 5000 UNITS EX SOLR
CUTANEOUS | Status: AC
  Filled 2015-02-10: qty 5000

## 2015-02-10 MED ORDER — PROPOFOL 10 MG/ML IV BOLUS
INTRAVENOUS | Status: DC | PRN
  Administered 2015-02-10: 130 mg via INTRAVENOUS

## 2015-02-10 MED ORDER — LACTATED RINGERS IV SOLN
INTRAVENOUS | Status: DC
  Administered 2015-02-10 (×2): via INTRAVENOUS

## 2015-02-10 MED ORDER — BUPIVACAINE HCL (PF) 0.25 % IJ SOLN
INTRAMUSCULAR | Status: AC
  Filled 2015-02-10: qty 30

## 2015-02-10 MED ORDER — FENTANYL CITRATE (PF) 100 MCG/2ML IJ SOLN
INTRAMUSCULAR | Status: DC | PRN
  Administered 2015-02-10: 100 ug via INTRAVENOUS

## 2015-02-10 MED ORDER — SODIUM CHLORIDE 0.9 % IR SOLN
Status: DC | PRN
  Administered 2015-02-10: 500 mL

## 2015-02-10 MED ORDER — LACTATED RINGERS IV SOLN: INTRAVENOUS | Status: DC

## 2015-02-10 MED ORDER — LATANOPROST 0.005 % OP SOLN: 1.0000 [drp] | Freq: Every day | OPHTHALMIC | Status: DC

## 2015-02-10 MED ORDER — HYDROMORPHONE HCL 1 MG/ML IJ SOLN
INTRAMUSCULAR | Status: DC | PRN
  Administered 2015-02-10 (×2): 1 mg via INTRAVENOUS

## 2015-02-10 MED ORDER — METHOCARBAMOL 1000 MG/10ML IJ SOLN
500.0000 mg | Freq: Four times a day (QID) | INTRAVENOUS | Status: DC | PRN
  Administered 2015-02-10: 500 mg via INTRAVENOUS
  Filled 2015-02-10 (×2): qty 5

## 2015-02-10 MED ORDER — HYDROMORPHONE HCL 1 MG/ML IJ SOLN
INTRAMUSCULAR | Status: AC
  Filled 2015-02-10: qty 1

## 2015-02-10 MED ORDER — DEXAMETHASONE SODIUM PHOSPHATE 10 MG/ML IJ SOLN
INTRAMUSCULAR | Status: AC
  Filled 2015-02-10: qty 1

## 2015-02-10 MED ORDER — LIDOCAINE HCL (PF) 2 % IJ SOLN
INTRAMUSCULAR | Status: DC | PRN
  Administered 2015-02-10: 20 mg via INTRADERMAL

## 2015-02-10 MED ORDER — POLYETHYLENE GLYCOL 3350 17 G PO PACK: 17.0000 g | PACK | Freq: Every day | ORAL | Status: DC | PRN

## 2015-02-10 MED ORDER — CEFAZOLIN SODIUM-DEXTROSE 2-3 GM-% IV SOLR
INTRAVENOUS | Status: AC
  Filled 2015-02-10: qty 50

## 2015-02-10 MED ORDER — BUPIVACAINE LIPOSOME 1.3 % IJ SUSP
20.0000 mL | Freq: Once | INTRAMUSCULAR | Status: AC
  Administered 2015-02-10: 20 mL
  Filled 2015-02-10: qty 20

## 2015-02-10 MED ORDER — CEFAZOLIN SODIUM-DEXTROSE 2-3 GM-% IV SOLR
2.0000 g | INTRAVENOUS | Status: AC
  Administered 2015-02-10: 2 g via INTRAVENOUS

## 2015-02-10 MED ORDER — ACETAMINOPHEN 650 MG RE SUPP: 650.0000 mg | Freq: Four times a day (QID) | RECTAL | Status: DC | PRN

## 2015-02-10 MED ORDER — ALUM & MAG HYDROXIDE-SIMETH 200-200-20 MG/5ML PO SUSP
30.0000 mL | ORAL | Status: DC | PRN
Start: 2015-02-10 — End: 2015-02-12

## 2015-02-10 MED ORDER — THROMBIN 5000 UNITS EX SOLR
OROMUCOSAL | Status: DC | PRN
  Administered 2015-02-10: 12:00:00 via TOPICAL

## 2015-02-10 MED ORDER — ONDANSETRON HCL 4 MG PO TABS: 4.0000 mg | ORAL_TABLET | Freq: Four times a day (QID) | ORAL | Status: DC | PRN

## 2015-02-10 MED ORDER — AMIODARONE HCL 200 MG PO TABS
200.0000 mg | ORAL_TABLET | Freq: Every day | ORAL | Status: DC
  Administered 2015-02-11 – 2015-02-12 (×2): 200 mg via ORAL
  Filled 2015-02-10 (×2): qty 1

## 2015-02-10 MED ORDER — ONDANSETRON HCL 4 MG/2ML IJ SOLN
4.0000 mg | Freq: Four times a day (QID) | INTRAMUSCULAR | Status: DC | PRN
  Administered 2015-02-10: 4 mg via INTRAVENOUS
  Filled 2015-02-10: qty 2

## 2015-02-10 MED ORDER — MENTHOL 3 MG MT LOZG: 1.0000 | LOZENGE | OROMUCOSAL | Status: DC | PRN

## 2015-02-10 MED ORDER — HYDROCODONE-ACETAMINOPHEN 5-325 MG PO TABS
1.0000 | ORAL_TABLET | ORAL | Status: DC | PRN
  Filled 2015-02-10: qty 2

## 2015-02-10 MED ORDER — RIVAROXABAN 10 MG PO TABS
10.0000 mg | ORAL_TABLET | Freq: Every day | ORAL | Status: DC
  Administered 2015-02-11 – 2015-02-12 (×2): 10 mg via ORAL
  Filled 2015-02-10 (×3): qty 1

## 2015-02-10 MED ORDER — HYDROMORPHONE HCL 1 MG/ML IJ SOLN: 0.5000 mg | INTRAMUSCULAR | Status: DC | PRN

## 2015-02-10 MED ORDER — BISACODYL 5 MG PO TBEC: 5.0000 mg | DELAYED_RELEASE_TABLET | Freq: Every day | ORAL | Status: DC | PRN

## 2015-02-10 MED ORDER — FLEET ENEMA 7-19 GM/118ML RE ENEM: 1.0000 | ENEMA | Freq: Once | RECTAL | Status: DC | PRN

## 2015-02-10 MED ORDER — MEPERIDINE HCL 50 MG/ML IJ SOLN: 6.2500 mg | INTRAMUSCULAR | Status: DC | PRN

## 2015-02-10 MED ORDER — ARTIFICIAL TEARS OP OINT
TOPICAL_OINTMENT | OPHTHALMIC | Status: AC
Start: 2015-02-10 — End: 2015-02-10
  Filled 2015-02-10: qty 3.5

## 2015-02-10 MED ORDER — PROPOFOL 10 MG/ML IV BOLUS
INTRAVENOUS | Status: AC
  Filled 2015-02-10: qty 20

## 2015-02-10 MED ORDER — CEFAZOLIN SODIUM 1-5 GM-% IV SOLN
1.0000 g | Freq: Four times a day (QID) | INTRAVENOUS | Status: AC
  Administered 2015-02-10 – 2015-02-11 (×2): 1 g via INTRAVENOUS
  Filled 2015-02-10 (×2): qty 50

## 2015-02-10 MED ORDER — HYDROMORPHONE HCL 1 MG/ML IJ SOLN
0.2500 mg | INTRAMUSCULAR | Status: DC | PRN
  Administered 2015-02-10: 0.25 mg via INTRAVENOUS
  Administered 2015-02-10: 0.5 mg via INTRAVENOUS
  Administered 2015-02-10: 0.25 mg via INTRAVENOUS

## 2015-02-10 MED ORDER — ONDANSETRON HCL 4 MG/2ML IJ SOLN
INTRAMUSCULAR | Status: DC | PRN
  Administered 2015-02-10: 4 mg via INTRAVENOUS

## 2015-02-10 MED ORDER — NITROGLYCERIN 0.4 MG SL SUBL: 0.4000 mg | SUBLINGUAL_TABLET | SUBLINGUAL | Status: DC | PRN

## 2015-02-10 MED ORDER — PHENOL 1.4 % MT LIQD
1.0000 | OROMUCOSAL | Status: DC | PRN
  Filled 2015-02-10: qty 177

## 2015-02-10 MED ORDER — PROMETHAZINE HCL 25 MG/ML IJ SOLN: 6.2500 mg | INTRAMUSCULAR | Status: DC | PRN

## 2015-02-10 MED ORDER — TRANEXAMIC ACID 1000 MG/10ML IV SOLN
2000.0000 mg | Freq: Once | INTRAVENOUS | Status: AC
  Administered 2015-02-10: 2000 mg via TOPICAL
  Filled 2015-02-10: qty 20

## 2015-02-10 MED ORDER — CHLORHEXIDINE GLUCONATE 4 % EX LIQD
60.0000 mL | Freq: Once | CUTANEOUS | Status: DC
Start: 2015-02-10 — End: 2015-02-10

## 2015-02-10 MED ORDER — SODIUM CHLORIDE 0.9 % IJ SOLN
INTRAMUSCULAR | Status: AC
  Filled 2015-02-10: qty 50

## 2015-02-10 MED ORDER — FENTANYL CITRATE (PF) 100 MCG/2ML IJ SOLN
INTRAMUSCULAR | Status: AC
  Filled 2015-02-10: qty 2

## 2015-02-10 MED ORDER — ONDANSETRON HCL 4 MG/2ML IJ SOLN
INTRAMUSCULAR | Status: AC
  Filled 2015-02-10: qty 2

## 2015-02-10 MED ORDER — HYDROMORPHONE HCL 2 MG/ML IJ SOLN
INTRAMUSCULAR | Status: AC
  Filled 2015-02-10: qty 1

## 2015-02-10 MED ORDER — OXYCODONE-ACETAMINOPHEN 5-325 MG PO TABS
2.0000 | ORAL_TABLET | ORAL | Status: DC | PRN
  Administered 2015-02-10 – 2015-02-12 (×8): 2 via ORAL
  Filled 2015-02-10 (×8): qty 2

## 2015-02-10 MED ORDER — NADOLOL 40 MG PO TABS
40.0000 mg | ORAL_TABLET | Freq: Every day | ORAL | Status: DC
  Administered 2015-02-11 – 2015-02-12 (×2): 40 mg via ORAL
  Filled 2015-02-10 (×3): qty 1

## 2015-02-10 MED ORDER — SUCCINYLCHOLINE CHLORIDE 20 MG/ML IJ SOLN
INTRAMUSCULAR | Status: DC | PRN
  Administered 2015-02-10: 100 mg via INTRAVENOUS

## 2015-02-10 MED ORDER — LATANOPROST 0.005 % OP SOLN
1.0000 [drp] | Freq: Every day | OPHTHALMIC | Status: DC
  Administered 2015-02-11 – 2015-02-12 (×2): 1 [drp] via OPHTHALMIC
  Filled 2015-02-10: qty 2.5

## 2015-02-10 MED ORDER — SODIUM CHLORIDE 0.9 % IR SOLN
Status: AC
  Filled 2015-02-10: qty 1

## 2015-02-10 MED ORDER — DEXAMETHASONE SODIUM PHOSPHATE 10 MG/ML IJ SOLN
INTRAMUSCULAR | Status: DC | PRN
Start: 2015-02-10 — End: 2015-02-10
  Administered 2015-02-10: 10 mg via INTRAVENOUS

## 2015-02-10 MED ORDER — FERROUS SULFATE 325 (65 FE) MG PO TABS
325.0000 mg | ORAL_TABLET | Freq: Three times a day (TID) | ORAL | Status: DC
  Administered 2015-02-12: 325 mg via ORAL
  Filled 2015-02-10 (×8): qty 1

## 2015-02-10 MED ORDER — CHLORHEXIDINE GLUCONATE 4 % EX LIQD: 60.0000 mL | Freq: Once | CUTANEOUS | Status: DC

## 2015-02-10 MED ORDER — BUPIVACAINE-EPINEPHRINE 0.25% -1:200000 IJ SOLN
INTRAMUSCULAR | Status: AC
  Filled 2015-02-10: qty 1

## 2015-02-10 MED ORDER — ACETAMINOPHEN 325 MG PO TABS
650.0000 mg | ORAL_TABLET | Freq: Four times a day (QID) | ORAL | Status: DC | PRN
Start: 2015-02-10 — End: 2015-02-12

## 2015-02-10 MED ORDER — METHOCARBAMOL 500 MG PO TABS
500.0000 mg | ORAL_TABLET | Freq: Four times a day (QID) | ORAL | Status: DC | PRN
  Administered 2015-02-10 – 2015-02-12 (×4): 500 mg via ORAL
  Filled 2015-02-10 (×4): qty 1

## 2015-02-10 MED ORDER — SODIUM CHLORIDE 0.9 % IJ SOLN
INTRAMUSCULAR | Status: DC | PRN
  Administered 2015-02-10: 20 mL

## 2015-02-10 SURGICAL SUPPLY — 74 items
BAG DECANTER FOR FLEXI CONT (MISCELLANEOUS) IMPLANT
BAG ZIPLOCK 12X15 (MISCELLANEOUS) IMPLANT
BANDAGE ELASTIC 4 VELCRO ST LF (GAUZE/BANDAGES/DRESSINGS) ×3 IMPLANT
BANDAGE ELASTIC 6 VELCRO ST LF (GAUZE/BANDAGES/DRESSINGS) ×3 IMPLANT
BANDAGE ESMARK 6X9 LF (GAUZE/BANDAGES/DRESSINGS) ×1 IMPLANT
BLADE SAG 18X100X1.27 (BLADE) ×3 IMPLANT
BLADE SAW SGTL 11.0X1.19X90.0M (BLADE) ×3 IMPLANT
BNDG ESMARK 6X9 LF (GAUZE/BANDAGES/DRESSINGS) ×3
BONE CEMENT GENTAMICIN (Cement) ×6 IMPLANT
CAP KNEE TOTAL 3 SIGMA ×3 IMPLANT
CEMENT BONE GENTAMICIN 40 (Cement) ×2 IMPLANT
CUFF TOURN SGL QUICK 34 (TOURNIQUET CUFF) ×2
CUFF TRNQT CYL 34X4X40X1 (TOURNIQUET CUFF) ×1 IMPLANT
DECANTER SPIKE VIAL GLASS SM (MISCELLANEOUS) ×3 IMPLANT
DRAPE EXTREMITY T 121X128X90 (DRAPE) ×3 IMPLANT
DRAPE INCISE IOBAN 66X45 STRL (DRAPES) IMPLANT
DRAPE POUCH INSTRU U-SHP 10X18 (DRAPES) ×3 IMPLANT
DRAPE SHEET LG 3/4 BI-LAMINATE (DRAPES) ×3 IMPLANT
DRAPE U-SHAPE 47X51 STRL (DRAPES) ×3 IMPLANT
DRSG AQUACEL AG ADV 3.5X10 (GAUZE/BANDAGES/DRESSINGS) ×3 IMPLANT
DRSG PAD ABDOMINAL 8X10 ST (GAUZE/BANDAGES/DRESSINGS) IMPLANT
DRSG TEGADERM 4X4.75 (GAUZE/BANDAGES/DRESSINGS) ×3 IMPLANT
DURAPREP 26ML APPLICATOR (WOUND CARE) ×3 IMPLANT
ELECT REM PT RETURN 9FT ADLT (ELECTROSURGICAL) ×3
ELECTRODE REM PT RTRN 9FT ADLT (ELECTROSURGICAL) ×1 IMPLANT
EVACUATOR 1/8 PVC DRAIN (DRAIN) ×3 IMPLANT
FACESHIELD WRAPAROUND (MASK) ×15 IMPLANT
GAUZE SPONGE 2X2 8PLY STRL LF (GAUZE/BANDAGES/DRESSINGS) ×1 IMPLANT
GLOVE BIOGEL PI IND STRL 6.5 (GLOVE) ×1 IMPLANT
GLOVE BIOGEL PI IND STRL 8 (GLOVE) ×1 IMPLANT
GLOVE BIOGEL PI INDICATOR 6.5 (GLOVE) ×2
GLOVE BIOGEL PI INDICATOR 8 (GLOVE) ×2
GLOVE ECLIPSE 8.0 STRL XLNG CF (GLOVE) ×6 IMPLANT
GLOVE SURG SS PI 6.5 STRL IVOR (GLOVE) ×3 IMPLANT
GOWN STRL REUS W/TWL LRG LVL3 (GOWN DISPOSABLE) ×3 IMPLANT
GOWN STRL REUS W/TWL XL LVL3 (GOWN DISPOSABLE) ×3 IMPLANT
HANDPIECE INTERPULSE COAX TIP (DISPOSABLE) ×2
IMMOBILIZER KNEE 20 (SOFTGOODS) ×6 IMPLANT
IMMOBILIZER KNEE 20 THIGH 36 (SOFTGOODS) ×1 IMPLANT
KIT BASIN OR (CUSTOM PROCEDURE TRAY) ×3 IMPLANT
LIQUID BAND (GAUZE/BANDAGES/DRESSINGS) ×3 IMPLANT
MANIFOLD NEPTUNE II (INSTRUMENTS) ×3 IMPLANT
NDL SAFETY ECLIPSE 18X1.5 (NEEDLE) ×2 IMPLANT
NEEDLE HYPO 18GX1.5 SHARP (NEEDLE) ×4
NEEDLE HYPO 22GX1.5 SAFETY (NEEDLE) ×3 IMPLANT
NS IRRIG 1000ML POUR BTL (IV SOLUTION) IMPLANT
PACK TOTAL JOINT (CUSTOM PROCEDURE TRAY) ×3 IMPLANT
PADDING CAST COTTON 6X4 STRL (CAST SUPPLIES) IMPLANT
PEN SKIN MARKING BROAD (MISCELLANEOUS) ×3 IMPLANT
POSITIONER SURGICAL ARM (MISCELLANEOUS) ×3 IMPLANT
SET HNDPC FAN SPRY TIP SCT (DISPOSABLE) ×1 IMPLANT
SET PAD KNEE POSITIONER (MISCELLANEOUS) ×3 IMPLANT
SPONGE GAUZE 2X2 STER 10/PKG (GAUZE/BANDAGES/DRESSINGS) ×2
SPONGE LAP 18X18 X RAY DECT (DISPOSABLE) IMPLANT
SPONGE SURGIFOAM ABS GEL 100 (HEMOSTASIS) ×3 IMPLANT
STAPLER VISISTAT 35W (STAPLE) IMPLANT
SUCTION FRAZIER 12FR DISP (SUCTIONS) ×3 IMPLANT
SUT BONE WAX W31G (SUTURE) ×3 IMPLANT
SUT MNCRL AB 4-0 PS2 18 (SUTURE) ×3 IMPLANT
SUT VIC AB 1 CT1 27 (SUTURE) ×4
SUT VIC AB 1 CT1 27XBRD ANTBC (SUTURE) ×2 IMPLANT
SUT VIC AB 2-0 CT1 27 (SUTURE) ×6
SUT VIC AB 2-0 CT1 TAPERPNT 27 (SUTURE) ×3 IMPLANT
SUT VLOC 180 0 24IN GS25 (SUTURE) ×3 IMPLANT
SYR 20CC LL (SYRINGE) ×6 IMPLANT
SYR 50ML LL SCALE MARK (SYRINGE) ×3 IMPLANT
TOWEL OR 17X26 10 PK STRL BLUE (TOWEL DISPOSABLE) ×3 IMPLANT
TOWEL OR NON WOVEN STRL DISP B (DISPOSABLE) IMPLANT
TOWER CARTRIDGE SMART MIX (DISPOSABLE) ×3 IMPLANT
TRAY FOLEY W/METER SILVER 14FR (SET/KITS/TRAYS/PACK) ×3 IMPLANT
TRAY FOLEY W/METER SILVER 16FR (SET/KITS/TRAYS/PACK) ×3 IMPLANT
WATER STERILE IRR 1500ML POUR (IV SOLUTION) ×3 IMPLANT
WRAP KNEE MAXI GEL POST OP (GAUZE/BANDAGES/DRESSINGS) ×3 IMPLANT
YANKAUER SUCT BULB TIP 10FT TU (MISCELLANEOUS) ×3 IMPLANT

## 2015-02-10 NOTE — Interval H&P Note (Signed)
History and Physical Interval Note:  02/10/2015 10:04 AM  [place]  has presented today for surgery, with the diagnosis of OA RIGHT KNEE   The various methods of treatment have been discussed with the patient and family. After consideration of risks, benefits and other options for treatment, the patient has consented to  Procedure(s): TOTAL RIGHT KNEE ARTHROPLASTY (Right) as a surgical intervention .  The patient's history has been reviewed, patient examined, no change in status, stable for surgery.  I have reviewed the patient's chart and labs.  Questions were answered to the patient's satisfaction.     Kayleah Appleyard A

## 2015-02-10 NOTE — Anesthesia Preprocedure Evaluation (Addendum)
Anesthesia Evaluation  Patient identified by MRN, date of birth, ID band Patient awake    Reviewed: Allergy & Precautions, NPO status , Patient's Chart, lab work & pertinent test results  Airway Mallampati: II  TM Distance: >3 FB Neck ROM: Full    Dental  (+) Dental Advisory Given   Pulmonary  breath sounds clear to auscultation        Cardiovascular hypertension, Pt. on medications + Peripheral Vascular Disease Rhythm:Regular Rate:Normal     Neuro/Psych negative neurological ROS  negative psych ROS   GI/Hepatic Neg liver ROS, GERD-  ,  Endo/Other  negative endocrine ROS  Renal/GU negative Renal ROS  negative genitourinary   Musculoskeletal  (+) Arthritis -, Osteoarthritis,    Abdominal   Peds negative pediatric ROS (+)  Hematology negative hematology ROS (+)   Anesthesia Other Findings   Reproductive/Obstetrics                            Lab Results  Component Value Date   WBC 5.4 02/03/2015   HGB 11.5* 02/03/2015   HCT 35.4* 02/03/2015   MCV 99.4 02/03/2015   PLT 236 02/03/2015   Lab Results  Component Value Date   CREATININE 0.83 02/03/2015   BUN 13 02/03/2015   NA 141 02/03/2015   K 5.0 02/03/2015   CL 104 02/03/2015   CO2 29 02/03/2015   Lab Results  Component Value Date   INR 1.36 02/03/2015   INR 1.27 10/16/2014   INR 1.54* 06/18/2012   EKG: atrial fibrillation, RBBB.   Anesthesia Physical Anesthesia Plan  ASA: III  Anesthesia Plan: General   Post-op Pain Management: GA combined w/ Regional for post-op pain   Induction: Intravenous  Airway Management Planned: Oral ETT  Additional Equipment:   Intra-op Plan:   Post-operative Plan: Extubation in OR  Informed Consent: I have reviewed the patients History and Physical, chart, labs and discussed the procedure including the risks, benefits and alternatives for the proposed anesthesia with the patient or  authorized representative who has indicated his/her understanding and acceptance.   Dental advisory given  Plan Discussed with: CRNA  Anesthesia Plan Comments:         Anesthesia Quick Evaluation

## 2015-02-10 NOTE — Brief Op Note (Signed)
02/10/2015  12:02 PM  PATIENT:  [name]  81 y.o. female  PRE-OPERATIVE DIAGNOSIS:Primary  OSTEOARTHRITIS OF RIGHT KNEE   POST-OPERATIVE DIAGNOSIS: Primary OSTEOARTHRITIS OF RIGHT KNEE   PROCEDURE:  Procedure(s): TOTAL RIGHT KNEE ARTHROPLASTY (Right)  SURGEON:  Surgeon(s) and Role:    * Latanya Maudlin, MD - Primary  PHYSICIAN ASSISTANT:Amber [employer] PA   ASSISTANTS: Ardeen Jourdain PA  ANESTHESIA:   general  EBL:  Total I/O In: -  Out: 75 [Urine:75]  BLOOD ADMINISTERED:none  DRAINS: (One) Hemovact drain(s) in the Right Knee with  Suction Open   LOCAL MEDICATIONS USED:  MARCAINE  20cc of 0.25% Plain and 20cc of Exparel mixed with 20cc of Normal Saline.   SPECIMEN:  No Specimen  DISPOSITION OF SPECIMEN:  N/A  COUNTS:  YES  TOURNIQUET:  * Missing tourniquet times found for documented tourniquets in log:  [id number] *  DICTATION: .Other Dictation: Dictation Number [id number]  PLAN OF CARE: Admit to inpatient   PATIENT DISPOSITION:  Stable in OR   Delay start of Pharmacological VTE agent (>24hrs) due to surgical blood loss or risk of bleeding: yes

## 2015-02-10 NOTE — Clinical Social Work Placement (Signed)
   CLINICAL SOCIAL WORK PLACEMENT  NOTE  Date:  02/10/2015  Patient Details  Name: [name] MRN: [id number] Date of Birth: 1928/07/23  Clinical Social Work is seeking post-discharge placement for this patient at the [place] level of care (*CSW will initial, date and re-position this form in  chart as items are completed):  No   Patient/family provided with [facility] Work Department's list of facilities offering this level of care within the geographic area requested by the patient (or if unable, by the patient's family).  Yes   Patient/family informed of their freedom to choose among providers that offer the needed level of care, that participate in Medicare, Medicaid or managed care program needed by the patient, have an available bed and are willing to accept the patient.  No   Patient/family informed of [facility]'s ownership interest in [facility] and [facility], as well as of the fact that they are under no obligation to receive care at these facilities.  PASRR submitted to EDS on       PASRR number received on       Existing PASRR number confirmed on 02/10/15     FL2 transmitted to all facilities in geographic area requested by pt/family on 02/10/15     FL2 transmitted to all facilities within larger geographic area on       Patient informed that his/her managed care company has contracts with or will negotiate with certain facilities, including the following:        Yes   Patient/family informed of bed offers received.  Patient chooses bed at  [facility])     Physician recommends and patient chooses bed at      Patient to be transferred to  ([place] [facility]) on  .  Patient to be transferred to facility by       Patient family notified on   of transfer.  Name of family member notified:        PHYSICIAN       Additional Comment:    _______________________________________________ Loraine Maple   [phone] 02/10/2015, 3:37 PM

## 2015-02-10 NOTE — Transfer of Care (Signed)
Immediate Anesthesia Transfer of Care Note  Patient: [name]  Procedure(s) Performed: Procedure(s): TOTAL RIGHT KNEE ARTHROPLASTY (Right)  Patient Location: PACU  Anesthesia Type:General  Level of Consciousness:  sedated, patient cooperative and responds to stimulation  Airway & Oxygen Therapy:Patient Spontanous Breathing and Patient connected to face mask oxgen  Post-op Assessment:  Report given to PACU RN and Post -op Vital signs reviewed and stable  Post vital signs:  Reviewed and stable  Last Vitals:  Filed Vitals:   02/10/15 0840  BP: 151/87  Pulse: 69  Temp: 36.7 C  Resp: 18    Complications: No apparent anesthesia complications

## 2015-02-10 NOTE — Clinical Social Work Note (Signed)
Clinical Social Work Assessment  Patient Details  Name: [name] MRN: [id number] Date of Birth: 1929/01/04  Date of referral:  02/10/15               Reason for consult:  Discharge Planning, Facility Placement                Permission sought to share information with:  Chartered certified accountant granted to share information::  Yes, Verbal Permission Granted  Name::        Agency::     Relationship::     Contact Information:     Housing/Transportation Living arrangements for the past 2 months:  Single Family Home Source of Information:  Patient, Adult Children Patient Interpreter Needed:  None Criminal Activity/Legal Involvement Pertinent to Current Situation/Hospitalization:  No - Comment as needed Significant Relationships:  Adult Children, Other Family Members Lives with:    Do you feel safe going back to the place where you live?   (ST Rehab is needed.) Need for family participation in patient care:  Yes (Comment)  Care giving concerns:  Pt's care cannot be managed at home following hospital d/c.   Social Worker assessment / plan:  Pt hospitalized on 02/10/15 for pre planned total knee arthroplasty. CSW met with pt / family to assist with d/c planning. Pt has made prior arrangements to have ST Rehab at [facility] at d/c. CSW has contacted SNF and d/c planned confirmed pending [facility] authorization. CSW will assist with insurance authorization once PT has evaluated and recommendations have been provided. CSW will continue to follow to assist with d/c planning needs.  Employment status:  Retired Nurse, adult PT Recommendations:  Not assessed at this time Information / Referral to community resources:  [place]  Patient/Family's Response to care:  Pt / family feel ST Rehab is needed.  Patient/Family's Understanding of and Emotional Response to Diagnosis, Current Treatment, and Prognosis:  Pt /  family are aware of pt's medical status. Pt is relieved her surgery is over and all went well. Pt is looking forward to feeling better and having rehab at [facility]. " I've been there in the past and I had a good experience. "  Emotional Assessment Appearance:  Appears stated age Attitude/Demeanor/Rapport:  Other (cooperative) Affect (typically observed):  Calm, Pleasant Orientation:  Oriented to Self, Oriented to Place, Oriented to  Time, Oriented to Situation Alcohol / Substance use:  Not Applicable Psych involvement (Current and /or in the community):  No (Comment)  Discharge Needs  Concerns to be addressed:  Discharge Planning Concerns Readmission within the last 30 days:  No Current discharge risk:  None Barriers to Discharge:  No Barriers Identified   Loraine Maple  [phone] 02/10/2015, 3:19 PM

## 2015-02-10 NOTE — Op Note (Signed)
NAMEYULEIMY, [name]           ACCOUNT NO.:  [id number]  MEDICAL RECORD NO.:  [id number]  LOCATION:  67                         FACILITY:  [facility]  PHYSICIAN:  Kipp Brood. Kynan Peasley, M.D.DATE OF BIRTH:  Mar 02, 1929  DATE OF PROCEDURE:  02/10/2015 DATE OF DISCHARGE:                              OPERATIVE REPORT   SURGEON:  Kipp Brood. Gladstone Lighter, M.D.  ASSISTANT:  Ardeen Jourdain, [place].  PREOPERATIVE DIAGNOSIS:  Primary osteoarthritis of the right knee.  POSTOPERATIVE DIAGNOSIS:  Primary osteoarthritis of the right knee.  OPERATION:  Right total knee arthroplasty utilizing DePuy system.  All three components were cemented, and gentamicin was used in the cement. The size of the femur was a size 4 narrow right posterior cruciate sacrificing type.  The tibial tray was a size 3.  The insert was a size 4, 15-mm thickness rotating platform polyethylene liner.  The patella was a size 38 with 3 pegs.  DESCRIPTION OF PROCEDURE:  Under general anesthesia, routine orthopedic prep and draping of the right lower extremity carried out.  The appropriate time-out was first carried out.  I also marked the appropriate right leg in the holding area.  At this time, the right leg was exsanguinated with Esmarch.  Tourniquet was elevated at 325 mmHg. The right leg then was placed in a DeMayo knee holder and flexed.  I then carried out an anterior approach to the knee in the usual fashion. Two flaps were created.  I then carried out a median parapatellar approach, reflected patella laterally, and then did medial and lateral meniscectomies.  I did an extensive synovectomy.  She had extensive synovitis and then went down and removed the anterior posterior cruciate ligaments.  At this particular time, I then made my initial drill hole in the intercondylar notch.  The guide rod was inserted up the canal, and I thoroughly irrigated out the canal.  I then removed 12 mm thickness off the distal femur in the usual  fashion because of the contracture in her body size.  At this time, I measured the femur then to be a size 4.  We felt that a 4 narrow would be a definite inline since we did on the other side.  We then did our anterior-posterior and chamfering cuts for a size 4 right femoral component.  Next, attention was directed to the tibia.  The tibia was extremely soft laterally.  We made our initial drill hole in the tibial plateau and felt that a size 3 tray was appropriate.  We then continued on and took at least 6 mm off the affected medial side.  After this, the laminar spreaders were inserted.  We removed the posterior spurs from the femoral condyles. Following that, we then inserted our spacer blocks and felt that a 15 mm thickness would be more than adequate.  At this time, we then continued to prepare the tibia.  We cut our keel cut in the tibia in the usual fashion with great care taken to watch the lateral side of the tibia. After that, we did our notch cut out of the distal femur.  Trial components were cemented and the __________ were inserted.  The knee was extended.  We did  a resurfacing procedure on the patella for a size 38 patella.  Three drill holes were made in the patella.  All trial components were removed.  We thoroughly water picked out the knee and then cemented all 3 components in simultaneously with gentamicin and the cement.  We waited for the cement to harden.  We then went back and removed all loose pieces of cement.  We also water picked out the posterior aspect in the knee to make sure the cement particles were now absent.  We then injected 20 mL of 0.25% Marcaine plain.  I then inserted my permanent size 4, 15-mm thickness polyethylene rotating platform, reduced the knee, and had excellent function. We had good stability, good flexion and extension.  I then irrigated with some antibiotic solution, put a Hemovac drain, and closed the wound layers in usual fashion.   Note, the patient had 2 g of IV Ancef preoperatively.  She had tranexamic acid locally.          ______________________________ Kipp Brood Gladstone Lighter, M.D.     RAG/MEDQ  D:  02/10/2015  T:  02/10/2015  Job:  [id number]

## 2015-02-10 NOTE — Anesthesia Procedure Notes (Signed)
Procedure Name: Intubation Date/Time: 02/10/2015 10:25 AM Performed by: Lajuana Carry E Pre-anesthesia Checklist: Patient identified, Emergency Drugs available, Suction available and Patient being monitored Patient Re-evaluated:Patient Re-evaluated prior to inductionOxygen Delivery Method: Circle System Utilized Preoxygenation: Pre-oxygenation with 100% oxygen Intubation Type: IV induction Ventilation: Mask ventilation without difficulty Laryngoscope Size: Miller and 3 Grade View: Grade II Tube type: Oral Number of attempts: 1 Airway Equipment and Method: Bougie stylet Placement Confirmation: ETT inserted through vocal cords under direct vision,  positive ETCO2 and breath sounds checked- equal and bilateral Secured at: 21 cm Tube secured with: Tape Dental Injury: Teeth and Oropharynx as per pre-operative assessment  Comments: Slightly anterior.

## 2015-02-10 NOTE — Anesthesia Postprocedure Evaluation (Signed)
  Anesthesia Post-op Note  Patient: [name]  Procedure(s) Performed: Procedure(s): TOTAL RIGHT KNEE ARTHROPLASTY (Right)  Patient Location: PACU  Anesthesia Type:General  Level of Consciousness: awake and alert   Airway and Oxygen Therapy: Patient Spontanous Breathing  Post-op Pain: moderate  Post-op Assessment: Post-op Vital signs reviewed and Patient's Cardiovascular Status Stable              Post-op Vital Signs: Reviewed and stable  Last Vitals:  Filed Vitals:   02/10/15 1315  BP: 153/78  Pulse: 70  Temp:   Resp: 9    Complications: No apparent anesthesia complications

## 2015-02-11 ENCOUNTER — Encounter (HOSPITAL_COMMUNITY): Payer: Self-pay | Admitting: Orthopedic Surgery

## 2015-02-11 LAB — BASIC METABOLIC PANEL
ANION GAP: 6 (ref 5–15)
BUN: 11 mg/dL (ref 6–20)
CHLORIDE: 103 mmol/L (ref 101–111)
CO2: 28 mmol/L (ref 22–32)
Calcium: 9 mg/dL (ref 8.9–10.3)
Creatinine, Ser: 0.65 mg/dL (ref 0.44–1.00)
GFR calc Af Amer: >60 mL/min (ref >60)
GFR calc non Af Amer: >60 mL/min (ref >60)
Glucose, Bld: 156 mg/dL — ABNORMAL HIGH (ref 65–99)
POTASSIUM: 4.7 mmol/L (ref 3.5–5.1)
SODIUM: 137 mmol/L (ref 135–145)

## 2015-02-11 LAB — CBC
HCT: 30 % — ABNORMAL LOW (ref 36.0–46.0)
HEMOGLOBIN: 9.7 g/dL — AB (ref 12.0–15.0)
MCH: 32 pg (ref 26.0–34.0)
MCHC: 32.3 g/dL (ref 30.0–36.0)
MCV: 99 fL (ref 78.0–100.0)
Platelets: 163 10*3/uL (ref 150–400)
RBC: 3.03 MIL/uL — AB (ref 3.87–5.11)
RDW: 14.5 % (ref 11.5–15.5)
WBC: 6.5 10*3/uL (ref 4.0–10.5)

## 2015-02-11 MED ORDER — TIZANIDINE HCL 4 MG PO TABS: 4.0000 mg | ORAL_TABLET | Freq: Three times a day (TID) | ORAL | Status: DC | PRN

## 2015-02-11 MED ORDER — OXYCODONE-ACETAMINOPHEN 5-325 MG PO TABS: 1.0000 | ORAL_TABLET | ORAL | Status: DC | PRN

## 2015-02-11 MED ORDER — AMIODARONE HCL 200 MG PO TABS: 100.0000 mg | ORAL_TABLET | Freq: Two times a day (BID) | ORAL | Status: DC

## 2015-02-11 MED ORDER — HYDROCODONE-ACETAMINOPHEN 5-325 MG PO TABS: 1.0000 | ORAL_TABLET | ORAL | Status: DC | PRN

## 2015-02-11 NOTE — Plan of Care (Signed)
Problem: Consults Goal: Diagnosis- Total Joint Replacement Outcome: Completed/Met Date Met:  02/11/15 Primary Total Knee RIGHT     

## 2015-02-11 NOTE — Progress Notes (Signed)
Subjective: 1 Day Post-Op Procedure(s) (LRB): TOTAL RIGHT KNEE ARTHROPLASTY (Right) Patient reports pain as 2 on 0-10 scale.Doing well. Hemovac DCd    Objective: Vital signs in last 24 hours: Temp:  [96.3 F (35.7 C)-98.1 F (36.7 C)] 97.8 F (36.6 C) (09/01 0444) Pulse Rate:  [68-70] 70 (09/01 0444) Resp:  [9-18] 16 (09/01 0444) BP: (102-159)/(52-95) 119/72 mmHg (09/01 0444) SpO2:  [94 %-100 %] 100 % (09/01 0444) Weight:  [80.627 kg (177 lb 12 oz)-81 kg (178 lb 9.2 oz)] 81 kg (178 lb 9.2 oz) (08/31 1400)  Intake/Output from previous day: 08/31 0701 - 09/01 0700 In: 3539.7 [P.O.:660; I.V.:2724.7; IV Piggyback:155] Out: 1215 [Urine:1125; Drains:90] Intake/Output this shift:     Recent Labs  02/11/15 0502  HGB 9.7*    Recent Labs  02/11/15 0502  WBC 6.5  RBC 3.03*  HCT 30.0*  PLT 163    Recent Labs  02/11/15 0502  NA 137  K 4.7  CL 103  CO2 28  BUN 11  CREATININE 0.65  GLUCOSE 156*  CALCIUM 9.0   No results for input(s): LABPT, INR in the last 72 hours.  Dorsiflexion/Plantar flexion intact  Assessment/Plan: 1 Day Post-Op Procedure(s) (LRB): TOTAL RIGHT KNEE ARTHROPLASTY (Right) Up with therapy  [name] A 02/11/2015, 7:27 AM

## 2015-02-11 NOTE — Progress Notes (Signed)
Utilization review completed.  

## 2015-02-11 NOTE — Care Management Note (Signed)
Case Management Note  Patient Details  Name: FABLE HUISMAN MRN: [id number] Date of Birth: [birth year]  Subjective/Objective:                   TOTAL RIGHT KNEE ARTHROPLASTY (Right) Action/Plan: CM notes and confirms with pt she will be going to SNF for rehab; CSW arranging.  No other CM needs were communicated.  Expected Discharge Date:                  Expected Discharge Plan:  [place]  In-House Referral:     Discharge planning Services  CM Consult  Post Acute Care Choice:    Choice offered to:     DME Arranged:    DME Agency:     HH Arranged:    [place] Agency:     Status of Service:  Completed, signed off  Medicare Important Message Given:    Date Medicare IM Given:    Medicare IM give by:    Date Additional Medicare IM Given:    Additional Medicare Important Message give by:     If discussed at [place] of Stay Meetings, dates discussed:    Additional Comments:  Dellie Catholic, RN 02/11/2015, 10:38 AM

## 2015-02-11 NOTE — Evaluation (Signed)
Physical Therapy Evaluation Patient Details Name: [name] MRN: [id number] DOB: 17-Mar-1929 Today's Date: 02/11/2015   History of Present Illness  s/p R TKA  Clinical Impression  Pt s/p R TKR presents with decreased R LE strength/ROM and post op pain limiting functional mobility.  Pt would benefit from follow up rehab at SNF level to maximize IND and safety prior to return home with ltd assist.    Follow Up Recommendations SNF    Equipment Recommendations  None recommended by PT    Recommendations for Other Services OT consult     Precautions / Restrictions Precautions Precautions: Knee;Fall Required Braces or Orthoses: Knee Immobilizer - Right Knee Immobilizer - Right: Discontinue once straight leg raise with < 10 degree lag Restrictions Weight Bearing Restrictions: No Other Position/Activity Restrictions: WBAT      Mobility  Bed Mobility               General bed mobility comments: Pt OOB with OT  Transfers Overall transfer level: Needs assistance Equipment used: Rolling walker (2 wheeled) Transfers: Sit to/from Stand Sit to Stand: Mod assist Stand pivot transfers: Min assist (after Mod A for sit to stand)       General transfer comment: cues for UE/LE placement  Ambulation/Gait Ambulation/Gait assistance: Min assist;Mod assist Ambulation Distance (Feet): 34 Feet Assistive device: Rolling walker (2 wheeled) Gait Pattern/deviations: Step-to pattern;Decreased step length - right;Decreased step length - left;Shuffle;Trunk flexed Gait velocity: decr   General Gait Details: cues for sequence, posture, position from [employer]            Wheelchair Mobility    Modified Rankin (Stroke Patients Only)       Balance                                             Pertinent Vitals/Pain Pain Assessment: 0-10 Pain Score: 8  Pain Location: R knee Pain Descriptors / Indicators: Aching;Sore Pain Intervention(s): Limited  activity within patient's tolerance;Monitored during session;Premedicated before session;Ice applied    Home Living Family/patient expects to be discharged to:: Skilled nursing facility Living Arrangements: Alone                    Prior Function Level of Independence: Independent               Hand Dominance        Extremity/Trunk Assessment   Upper Extremity Assessment: Overall WFL for tasks assessed           Lower Extremity Assessment: RLE deficits/detail      Cervical / Trunk Assessment: Kyphotic  Communication   Communication: No difficulties  Cognition Arousal/Alertness: Awake/alert Behavior During Therapy: WFL for tasks assessed/performed Overall Cognitive Status: Within Functional Limits for tasks assessed                      General Comments      Exercises        Assessment/Plan    PT Assessment Patient needs continued PT services  PT Diagnosis Difficulty walking   PT Problem List Decreased strength;Decreased range of motion;Decreased activity tolerance;Decreased mobility;Decreased knowledge of use of DME;Pain  PT Treatment Interventions DME instruction;Gait training;Functional mobility training;Therapeutic activities;Therapeutic exercise;Patient/family education   PT Goals (Current goals can be found in the Care Plan section) Acute Rehab PT Goals Patient Stated Goal: rehab to  get strength back and return to being independent PT Goal Formulation: With patient Time For Goal Achievement: 02/15/15 Potential to Achieve Goals: Good    Frequency 7X/week   Barriers to discharge        Co-evaluation               End of Session Equipment Utilized During Treatment: Gait belt;Right knee immobilizer Activity Tolerance: Patient limited by fatigue;Patient limited by pain Patient left: in chair;with call bell/phone within reach;with family/visitor present Nurse Communication: Mobility status         Time: 8721-5872 PT  Time Calculation (min) (ACUTE ONLY): 23 min   Charges:   PT Evaluation $Initial PT Evaluation Tier I: 1 Procedure PT Treatments $Gait Training: 8-22 mins   PT G Codes:        Danika Kluender 02/20/15, 12:43 PM

## 2015-02-11 NOTE — Discharge Instructions (Signed)

## 2015-02-11 NOTE — Evaluation (Signed)
Occupational Therapy Evaluation Patient Details Name: [name] MRN: [id number] DOB: 1928-07-05 Today's Date: 02/11/2015    History of Present Illness s/p R TKA   Clinical Impression   *This 81 year old female was admitted for the above surgery.  She will benefit from skilled OT in acute as well as follow up OT at SNF to restore independence with adls.  Pt currently needs mod A overall for LB adls and transfers.  Goals in acute are for min A overall.     Follow Up Recommendations  SNF    Equipment Recommendations  3 in 1 bedside comode    Recommendations for Other Services       Precautions / Restrictions Precautions Precautions: Knee;Fall Required Braces or Orthoses: Knee Immobilizer - Right Restrictions Weight Bearing Restrictions: No      Mobility Bed Mobility               General bed mobility comments: pt on EOB when OT arrived  Transfers Overall transfer level: Needs assistance Equipment used: Rolling walker (2 wheeled) Transfers: Sit to/from [facility] Sit to Stand: Mod assist Stand pivot transfers: Min assist (after Mod A for sit to stand)       General transfer comment: cues for UE/LE placement    Balance                                            ADL Overall ADL's : Needs assistance/impaired             Lower Body Bathing: Minimal assistance;Sit to/from stand       Lower Body Dressing: Maximal assistance;Sit to/from stand   Toilet Transfer: Moderate assistance;Stand-pivot (to recliner)             General ADL Comments: pt completed UB ADLs with set up:  NT had set her up.  Mod A for sit to stand.  Pt did wash peri area with support.  Pt needed mod A to stand from elevated bed, then took pivotal steps with min A and cues to weightbear through arms     Vision     Perception     Praxis      Pertinent Vitals/Pain Pain Assessment: 0-10 Pain Score: 10-Worst pain ever (with  standing) 5 sitting in chair Pain Location: R knee Pain Descriptors / Indicators: Aching Pain Intervention(s): Limited activity within patient's tolerance;Monitored during session;Premedicated before session;Repositioned;Ice applied     Hand Dominance     Extremity/Trunk Assessment Upper Extremity Assessment Upper Extremity Assessment: Overall WFL for tasks assessed           Communication Communication Communication: No difficulties   Cognition Arousal/Alertness: Awake/alert Behavior During Therapy: WFL for tasks assessed/performed Overall Cognitive Status: Within Functional Limits for tasks assessed                     General Comments       Exercises       Shoulder Instructions      Home Living Family/patient expects to be discharged to:: Skilled nursing facility Living Arrangements: Alone                                      Prior Functioning/Environment Level of Independence: Independent  OT Diagnosis: Generalized weakness;Acute pain   OT Problem List: Decreased strength;Decreased activity tolerance;Decreased knowledge of use of DME or AE;Pain   OT Treatment/Interventions: Self-care/ADL training;Energy conservation;Patient/family education    OT Goals(Current goals can be found in the care plan section) Acute Rehab OT Goals Patient Stated Goal: rehab to get strength back and return to being independent OT Goal Formulation: With patient Time For Goal Achievement: 02/18/15 Potential to Achieve Goals: Good ADL Goals Pt Will Perform Grooming: with min guard assist;standing Pt Will Perform Lower Body Bathing: with min assist;with adaptive equipment;sit to/from stand Pt Will Transfer to Toilet: with min assist;ambulating;bedside commode Pt Will Perform Toileting - Clothing Manipulation and hygiene: with min assist;sit to/from stand  OT Frequency: Min 2X/week   Barriers to D/C:            Co-evaluation               End of Session    Activity Tolerance: Patient tolerated treatment well Patient left: in chair;with call bell/phone within reach;with family/visitor present   Time: 1012-1040 OT Time Calculation (min): 28 min Charges:  OT General Charges $OT Visit: 1 Procedure OT Evaluation $Initial OT Evaluation Tier I: 1 Procedure OT Treatments $Self Care/Home Management : 8-22 mins G-Codes:    Cheyenne Schumm February 26, 2015, 11:22 AM   Lesle Chris, OTR/L [phone] February 26, 2015

## 2015-02-11 NOTE — Progress Notes (Signed)
Clinical Social Work  [employer] auth # [id number] MBW and next review date is on 9/6.  Sindy Messing, LCSW (Coverage for [employer])

## 2015-02-11 NOTE — Progress Notes (Signed)
Physical Therapy Treatment Patient Details Name: [name] MRN: [id number] DOB: [birth year] Today's Date: 02/11/2015    History of Present Illness s/p R TKA    PT Comments    Pt motivated and progressing well with mobility.  Follow Up Recommendations  SNF     Equipment Recommendations  None recommended by PT    Recommendations for Other Services OT consult     Precautions / Restrictions Precautions Precautions: Knee;Fall Required Braces or Orthoses: Knee Immobilizer - Right Knee Immobilizer - Right: Discontinue once straight leg raise with < 10 degree lag Restrictions Weight Bearing Restrictions: No Other Position/Activity Restrictions: WBAT    Mobility  Bed Mobility Overal bed mobility: Needs Assistance Bed Mobility: Supine to Sit     Supine to sit: Min assist;Mod assist     General bed mobility comments: cues for sequence and use of L LE to self assist  Transfers Overall transfer level: Needs assistance Equipment used: Rolling walker (2 wheeled) Transfers: Sit to/from Stand Sit to Stand: Mod assist         General transfer comment: cues for UE/LE placement  Ambulation/Gait Ambulation/Gait assistance: Min assist Ambulation Distance (Feet): 40 Feet (twice) Assistive device: Rolling walker (2 wheeled) Gait Pattern/deviations: Step-to pattern;Decreased step length - right;Decreased step length - left;Shuffle;Trunk flexed Gait velocity: decr   General Gait Details: cues for sequence, posture, position from [employer]            Wheelchair Mobility    Modified Rankin (Stroke Patients Only)       Balance                                    Cognition Arousal/Alertness: Awake/alert Behavior During Therapy: WFL for tasks assessed/performed Overall Cognitive Status: Within Functional Limits for tasks assessed                      Exercises Total Joint Exercises Ankle Circles/Pumps: AROM;Both;15  reps;Supine Quad Sets: AROM;Both;10 reps;Supine Heel Slides: AAROM;Right;15 reps;Supine Straight Leg Raises: AAROM;Right;10 reps;Supine    General Comments        Pertinent Vitals/Pain Pain Assessment: 0-10 Pain Score: 5  Pain Location: R knee Pain Descriptors / Indicators: Aching;Sore Pain Intervention(s): Limited activity within patient's tolerance;Monitored during session;Premedicated before session;Ice applied    Home Living                      Prior Function            PT Goals (current goals can now be found in the care plan section) Acute Rehab PT Goals Patient Stated Goal: rehab to get strength back and return to being independent PT Goal Formulation: With patient Time For Goal Achievement: 02/15/15 Potential to Achieve Goals: Good Progress towards PT goals: Progressing toward goals    Frequency  7X/week    PT Plan Current plan remains appropriate    Co-evaluation             End of Session Equipment Utilized During Treatment: Gait belt;Right knee immobilizer Activity Tolerance: Patient tolerated treatment well Patient left: in bed;with call bell/phone within reach     Time: 1430-1505 PT Time Calculation (min) (ACUTE ONLY): 35 min  Charges:  $Gait Training: 8-22 mins $Therapeutic Exercise: 8-22 mins                    G Codes:  Zigmond Trela 02/11/2015, 4:52 PM

## 2015-02-12 LAB — CBC
HCT: 27.9 % — ABNORMAL LOW (ref 36.0–46.0)
Hemoglobin: 9.2 g/dL — ABNORMAL LOW (ref 12.0–15.0)
MCH: 32.7 pg (ref 26.0–34.0)
MCHC: 33 g/dL (ref 30.0–36.0)
MCV: 99.3 fL (ref 78.0–100.0)
PLATELETS: 204 10*3/uL (ref 150–400)
RBC: 2.81 MIL/uL — AB (ref 3.87–5.11)
RDW: 14.8 % (ref 11.5–15.5)
WBC: 7.4 10*3/uL (ref 4.0–10.5)

## 2015-02-12 LAB — BASIC METABOLIC PANEL
Anion gap: 7 (ref 5–15)
BUN: 13 mg/dL (ref 6–20)
CHLORIDE: 101 mmol/L (ref 101–111)
CO2: 28 mmol/L (ref 22–32)
CREATININE: 0.67 mg/dL (ref 0.44–1.00)
Calcium: 9.2 mg/dL (ref 8.9–10.3)
GFR calc Af Amer: >60 mL/min (ref >60)
GLUCOSE: 122 mg/dL — AB (ref 65–99)
Potassium: 4.5 mmol/L (ref 3.5–5.1)
Sodium: 136 mmol/L (ref 135–145)

## 2015-02-12 NOTE — Clinical Social Work Placement (Signed)
   CLINICAL SOCIAL WORK PLACEMENT  NOTE  Date:  02/12/2015  Patient Details  Name: [name] MRN: [id number] Date of Birth: [birth year]  Clinical Social Work is seeking post-discharge placement for this patient at the [place] level of care (*CSW will initial, date and re-position this form in  chart as items are completed):  No   Patient/family provided with [facility] Work Department's list of facilities offering this level of care within the geographic area requested by the patient (or if unable, by the patient's family).  Yes   Patient/family informed of their freedom to choose among providers that offer the needed level of care, that participate in Medicare, Medicaid or managed care program needed by the patient, have an available bed and are willing to accept the patient.  No   Patient/family informed of [facility]'s ownership interest in [facility] and [facility], as well as of the fact that they are under no obligation to receive care at these facilities.  PASRR submitted to EDS on       PASRR number received on       Existing PASRR number confirmed on 02/10/15     FL2 transmitted to all facilities in geographic area requested by pt/family on 02/10/15     FL2 transmitted to all facilities within larger geographic area on       Patient informed that his/her managed care company has contracts with or will negotiate with certain facilities, including the following:        Yes   Patient/family informed of bed offers received.  Patient chooses bed at  [facility])     Physician recommends and patient chooses bed at      Patient to be transferred to  ([facility]) on 02/12/15.  Patient to be transferred to facility by PTAR     Patient family notified on 02/12/15 of transfer.  Name of family member notified:  DAUGHTER INLAW     PHYSICIAN       Additional Comment: Pt / family are in agreement with d/c to [facility] today. [facility] transport required. PT / Family are aware out of pocket costs may be associated with [facility] transport. [employer] medicare has authorized ST rehab at [facility]. Ambulance [facility] has been requested. [facility] has reviewed d/c summary, scripts, avs. Scripts have been included in d/c packet. D/c summary had been sent to SNF for review prior to d/c.   _______________________________________________ Luretha Rued, LCSW 02/12/2015, 11:51 AM

## 2015-02-12 NOTE — Plan of Care (Signed)
Problem: Phase III Progression Outcomes Goal: Anticoagulant follow-up in place Outcome: Not Applicable Date Met:  02/12/15 Xarelto VTE, no f/u needed.     

## 2015-02-12 NOTE — Care Management Important Message (Signed)
Important Message  Patient Details  Name: BAYLIN CABAL MRN: [id number] Date of Birth: Jan 22, 1929   Medicare Important Message Given:  Yes-second notification given    Camillo Flaming 02/12/2015, 11:39 AMImportant Message  Patient Details  Name: MONITA SWIER MRN: [id number] Date of Birth: [birth year]   Medicare Important Message Given:  Yes-second notification given    Camillo Flaming 02/12/2015, 11:39 AM

## 2015-02-12 NOTE — Progress Notes (Signed)
Physical Therapy Treatment Patient Details Name: [name] MRN: [id number] DOB: 21-Jun-1928 Today's Date: 02/12/2015    History of Present Illness s/p R TKA    PT Comments    Pt struggling with transfers from chair this am - c/o increased fatigue  Follow Up Recommendations  SNF     Equipment Recommendations  None recommended by PT    Recommendations for Other Services OT consult     Precautions / Restrictions Precautions Precautions: Knee;Fall Required Braces or Orthoses: Knee Immobilizer - Right Knee Immobilizer - Right: Discontinue once straight leg raise with < 10 degree lag Restrictions Weight Bearing Restrictions: No Other Position/Activity Restrictions: WBAT    Mobility  Bed Mobility               General bed mobility comments: NT - OOB with nursing  Transfers Overall transfer level: Needs assistance Equipment used: Rolling walker (2 wheeled) Transfers: Sit to/from Stand Sit to Stand: Mod assist;+2 physical assistance         General transfer comment: cues for UE/LE placement; Pt struggling to extend L LE and requiring increased assist this am to bring wt up over feet  Ambulation/Gait Ambulation/Gait assistance: Min assist Ambulation Distance (Feet): 58 Feet Assistive device: Rolling walker (2 wheeled) Gait Pattern/deviations: Step-to pattern;Decreased step length - right;Decreased step length - left;Shuffle Gait velocity: decr   General Gait Details: cues for sequence, posture, position from [employer]            Wheelchair Mobility    Modified Rankin (Stroke Patients Only)       Balance                                    Cognition Arousal/Alertness: Awake/alert Behavior During Therapy: WFL for tasks assessed/performed Overall Cognitive Status: Within Functional Limits for tasks assessed                      Exercises Total Joint Exercises Ankle Circles/Pumps: AROM;Both;15 reps;Supine Quad  Sets: AROM;Both;Supine;15 reps Heel Slides: AAROM;Right;15 reps;Supine Straight Leg Raises: AAROM;Right;Supine;15 reps    General Comments        Pertinent Vitals/Pain Pain Assessment: 0-10 Pain Score: 5  Pain Location: R knee Pain Descriptors / Indicators: Aching;Sore Pain Intervention(s): Limited activity within patient's tolerance;Monitored during session;Premedicated before session;Ice applied    Home Living                      Prior Function            PT Goals (current goals can now be found in the care plan section) Acute Rehab PT Goals Patient Stated Goal: rehab to get strength back and return to being independent PT Goal Formulation: With patient Time For Goal Achievement: 02/15/15 Potential to Achieve Goals: Good Progress towards PT goals: Progressing toward goals    Frequency  7X/week    PT Plan Current plan remains appropriate    Co-evaluation             End of Session Equipment Utilized During Treatment: Gait belt;Right knee immobilizer Activity Tolerance: Patient tolerated treatment well Patient left: in chair;with call bell/phone within reach     Time: 0929-1010 PT Time Calculation (min) (ACUTE ONLY): 41 min  Charges:  $Gait Training: 23-37 mins $Therapeutic Exercise: 8-22 mins  G Codes:      [name] 02-24-2015, 12:10 PM

## 2015-02-12 NOTE — Care Management Note (Signed)
Case Management Note  Patient Details  Name: [name] MRN: [id number] Date of Birth: Jun 01, 1929  Subjective/Objective:                    Action/Plan:d/c SNF.   Expected Discharge Date:                  Expected Discharge Plan:  [place]  In-House Referral:     Discharge planning Services  CM Consult  Post Acute Care Choice:    Choice offered to:     DME Arranged:    DME Agency:     HH Arranged:    [place] Agency:     Status of Service:  Completed, signed off  Medicare Important Message Given:    Date Medicare IM Given:    Medicare IM give by:    Date Additional Medicare IM Given:    Additional Medicare Important Message give by:     If discussed at [place] of Stay Meetings, dates discussed:    Additional Comments:  Dessa Phi, RN 02/12/2015, 10:05 AM

## 2015-02-12 NOTE — Discharge Summary (Signed)
Physician Discharge Summary   Patient ID: [name] MRN: [id number] DOB/AGE: 1928-10-14 81 y.o.  Admit date: 02/10/2015 Discharge date: 9/2/216  Primary Diagnosis: Primary osteoarthritis right knee  Admission Diagnoses:  Past Medical History  Diagnosis Date  . Arthritis   . Glaucoma   . Diverticulosis   . Mixed hyperlipidemia   . Essential hypertension, benign   . Colon polyps   . Paroxysmal atrial fibrillation   . Atrial flutter     s/p RFA 2003  . Sick sinus syndrome     s/p PPM (SJM)  . Varicose veins   . IBS (irritable bowel syndrome)   . Osteoporosis   . Urinary tract infection   . GERD (gastroesophageal reflux disease)   . DVT, lower extremity 1998 and 1999     bilateral  . Pneumonia 2011  . Breast cancer     1980's - lumpectomy only   Discharge Diagnoses:   Active Problems:   History of total knee arthroplasty  Estimated body mass index is 28.84 kg/(m^2) as calculated from the following:   Height as of this encounter: $RemoveBeforeD'5\' 6"'[id number]$  (1.676 m).   Weight as of this encounter: 81 kg (178 lb 9.2 oz).  Procedure:  Procedure(s) (LRB): TOTAL RIGHT KNEE ARTHROPLASTY (Right)   Consults: None  HPI: [name], 81 y.o. female, has a history of pain and functional disability in the right knee due to arthritis and has failed non-surgical conservative treatments for greater than 12 weeks to includeNSAID's and/or analgesics, corticosteriod injections and activity modification. Onset of symptoms was gradual, starting 3 years ago with gradually worsening course since that time. The patient noted prior procedures on the knee to include arthroscopy and menisectomy on the right knee(s). Patient currently rates pain in the right knee(s) at 7 out of 10 with activity. Patient has night pain, worsening of pain with activity and weight bearing, pain that interferes with activities of daily living, pain with passive range of motion, crepitus and joint swelling. Patient has  evidence of periarticular osteophytes and joint space narrowing by imaging studies. There is no active infection.   Laboratory Data: Admission on 02/10/2015  Component Date Value Ref Range Status  . MRSA, PCR 02/03/2015 NEGATIVE  NEGATIVE Final  . Staphylococcus aureus 02/03/2015 NEGATIVE  NEGATIVE Final   Comment:        The Xpert SA Assay (FDA approved for NASAL specimens in patients over 75 years of age), is one component of a comprehensive surveillance program.  Test performance has been validated by [facility] for patients greater than or equal to 6 year old. It is not intended to diagnose infection nor to guide or monitor treatment.   . WBC 02/11/2015 6.5  4.0 - 10.5 K/uL Final  . RBC 02/11/2015 3.03* 3.87 - 5.11 MIL/uL Final  . Hemoglobin 02/11/2015 9.7* 12.0 - 15.0 g/dL Final  . HCT 02/11/2015 30.0* 36.0 - 46.0 % Final  . MCV 02/11/2015 99.0  78.0 - 100.0 fL Final  . MCH 02/11/2015 32.0  26.0 - 34.0 pg Final  . MCHC 02/11/2015 32.3  30.0 - 36.0 g/dL Final  . RDW 02/11/2015 14.5  11.5 - 15.5 % Final  . Platelets 02/11/2015 163  150 - 400 K/uL Final  . Sodium 02/11/2015 137  135 - 145 mmol/L Final  . Potassium 02/11/2015 4.7  3.5 - 5.1 mmol/L Final  . Chloride 02/11/2015 103  101 - 111 mmol/L Final  . CO2 02/11/2015 28  22 - 32 mmol/L Final  .  Glucose, Bld 02/11/2015 156* 65 - 99 mg/dL Final  . BUN 02/11/2015 11  6 - 20 mg/dL Final  . Creatinine, Ser 02/11/2015 0.65  0.44 - 1.00 mg/dL Final  . Calcium 02/11/2015 9.0  8.9 - 10.3 mg/dL Final  . GFR calc non Af Amer 02/11/2015 >60  >60 mL/min Final  . GFR calc Af Amer 02/11/2015 >60  >60 mL/min Final   Comment: (NOTE) The eGFR has been calculated using the CKD EPI equation. This calculation has not been validated in all clinical situations. eGFR's persistently <60 mL/min signify possible Chronic Kidney Disease.   . Anion gap 02/11/2015 6  5 - 15 Final  . WBC 02/12/2015 7.4  4.0 - 10.5 K/uL Final   WHITE COUNT  CONFIRMED ON SMEAR  . RBC 02/12/2015 2.81* 3.87 - 5.11 MIL/uL Final  . Hemoglobin 02/12/2015 9.2* 12.0 - 15.0 g/dL Final  . HCT 02/12/2015 27.9* 36.0 - 46.0 % Final  . MCV 02/12/2015 99.3  78.0 - 100.0 fL Final  . MCH 02/12/2015 32.7  26.0 - 34.0 pg Final  . MCHC 02/12/2015 33.0  30.0 - 36.0 g/dL Final  . RDW 02/12/2015 14.8  11.5 - 15.5 % Final  . Platelets 02/12/2015 204  150 - 400 K/uL Final  . Sodium 02/12/2015 136  135 - 145 mmol/L Final  . Potassium 02/12/2015 4.5  3.5 - 5.1 mmol/L Final  . Chloride 02/12/2015 101  101 - 111 mmol/L Final  . CO2 02/12/2015 28  22 - 32 mmol/L Final  . Glucose, Bld 02/12/2015 122* 65 - 99 mg/dL Final  . BUN 02/12/2015 13  6 - 20 mg/dL Final  . Creatinine, Ser 02/12/2015 0.67  0.44 - 1.00 mg/dL Final  . Calcium 02/12/2015 9.2  8.9 - 10.3 mg/dL Final  . GFR calc non Af Amer 02/12/2015 >60  >60 mL/min Final  . GFR calc Af Amer 02/12/2015 >60  >60 mL/min Final   Comment: (NOTE) The eGFR has been calculated using the CKD EPI equation. This calculation has not been validated in all clinical situations. eGFR's persistently <60 mL/min signify possible Chronic Kidney Disease.   Georgiann Hahn gap 02/12/2015 7  5 - 15 Final  Hospital Outpatient Visit on 02/03/2015  Component Date Value Ref Range Status  . aPTT 02/03/2015 36  24 - 37 seconds Final  . WBC 02/03/2015 5.4  4.0 - 10.5 K/uL Final  . RBC 02/03/2015 3.56* 3.87 - 5.11 MIL/uL Final  . Hemoglobin 02/03/2015 11.5* 12.0 - 15.0 g/dL Final  . HCT 02/03/2015 35.4* 36.0 - 46.0 % Final  . MCV 02/03/2015 99.4  78.0 - 100.0 fL Final  . MCH 02/03/2015 32.3  26.0 - 34.0 pg Final  . MCHC 02/03/2015 32.5  30.0 - 36.0 g/dL Final  . RDW 02/03/2015 14.7  11.5 - 15.5 % Final  . Platelets 02/03/2015 236  150 - 400 K/uL Final  . Neutrophils Relative % 02/03/2015 53  43 - 77 % Final  . Neutro Abs 02/03/2015 2.9  1.7 - 7.7 K/uL Final  . Lymphocytes Relative 02/03/2015 34  12 - 46 % Final  . Lymphs Abs 02/03/2015 1.8  0.7  - 4.0 K/uL Final  . Monocytes Relative 02/03/2015 11  3 - 12 % Final  . Monocytes Absolute 02/03/2015 0.6  0.1 - 1.0 K/uL Final  . Eosinophils Relative 02/03/2015 2  0 - 5 % Final  . Eosinophils Absolute 02/03/2015 0.1  0.0 - 0.7 K/uL Final  . Basophils Relative 02/03/2015 0  0 - 1 % Final  . Basophils Absolute 02/03/2015 0.0  0.0 - 0.1 K/uL Final  . Sodium 02/03/2015 141  135 - 145 mmol/L Final  . Potassium 02/03/2015 5.0  3.5 - 5.1 mmol/L Final  . Chloride 02/03/2015 104  101 - 111 mmol/L Final  . CO2 02/03/2015 29  22 - 32 mmol/L Final  . Glucose, Bld 02/03/2015 106* 65 - 99 mg/dL Final  . BUN 02/03/2015 13  6 - 20 mg/dL Final  . Creatinine, Ser 02/03/2015 0.83  0.44 - 1.00 mg/dL Final  . Calcium 02/03/2015 9.9  8.9 - 10.3 mg/dL Final  . Total Protein 02/03/2015 7.5  6.5 - 8.1 g/dL Final  . Albumin 02/03/2015 3.8  3.5 - 5.0 g/dL Final  . AST 02/03/2015 15  15 - 41 U/L Final  . ALT 02/03/2015 13* 14 - 54 U/L Final  . Alkaline Phosphatase 02/03/2015 61  38 - 126 U/L Final  . Total Bilirubin 02/03/2015 0.5  0.3 - 1.2 mg/dL Final  . GFR calc non Af Amer 02/03/2015 >60  >60 mL/min Final  . GFR calc Af Amer 02/03/2015 >60  >60 mL/min Final   Comment: (NOTE) The eGFR has been calculated using the CKD EPI equation. This calculation has not been validated in all clinical situations. eGFR's persistently <60 mL/min signify possible Chronic Kidney Disease.   . Anion gap 02/03/2015 8  5 - 15 Final  . Prothrombin Time 02/03/2015 16.9* 11.6 - 15.2 seconds Final  . INR 02/03/2015 1.36  0.00 - 1.49 Final  . ABO/RH(D) 02/03/2015 A POS   Final  . Antibody Screen 02/03/2015 NEG   Final  . Sample Expiration 02/03/2015 02/13/2015   Final  . Color, Urine 02/03/2015 Jakson Delpilar* YELLOW Final   BIOCHEMICALS MAY BE AFFECTED BY COLOR  . APPearance 02/03/2015 CLEAR  CLEAR Final  . Specific Gravity, Urine 02/03/2015 1.029  1.005 - 1.030 Final  . pH 02/03/2015 6.0  5.0 - 8.0 Final  . Glucose, UA 02/03/2015  NEGATIVE  NEGATIVE mg/dL Final  . Hgb urine dipstick 02/03/2015 NEGATIVE  NEGATIVE Final  . Bilirubin Urine 02/03/2015 SMALL* NEGATIVE Final  . Ketones, ur 02/03/2015 NEGATIVE  NEGATIVE mg/dL Final  . Protein, ur 02/03/2015 NEGATIVE  NEGATIVE mg/dL Final  . Urobilinogen, UA 02/03/2015 0.2  0.0 - 1.0 mg/dL Final  . Nitrite 02/03/2015 NEGATIVE  NEGATIVE Final  . Leukocytes, UA 02/03/2015 SMALL* NEGATIVE Final  . Squamous Epithelial / LPF 02/03/2015 FEW* RARE Final  . WBC, UA 02/03/2015 7-10  <3 WBC/hpf Final  . Bacteria, UA 02/03/2015 RARE  RARE Final  . Urine-Other 02/03/2015 MUCOUS PRESENT   Final     X-Rays:Dg Chest 2 View  02/10/2015   CLINICAL DATA:  81 year old female preoperative study for knee surgery. Hypertension, heart murmur. Initial encounter.  EXAM: CHEST  2 VIEW  COMPARISON:  04/18/2014.  FINDINGS: Stable right chest dual lead cardiac pacemaker. Stable cardiomegaly and mediastinal contours. No pneumothorax, pulmonary edema or pleural effusion. Stable lung volumes and mild increased interstitial markings. No acute pulmonary opacity. No acute osseous abnormality identified.  IMPRESSION: Stable cardiomegaly.  No acute cardiopulmonary abnormality.   Electronically Signed   By: Genevie Ann M.D.   On: 02/10/2015 10:00     Hospital Course: [name] is a 81 y.o. who was admitted to [facility]. They were brought to the operating room on 02/10/2015 and underwent Procedure(s): TOTAL RIGHT KNEE ARTHROPLASTY.  Patient tolerated the procedure well and was later transferred to  the recovery room and then to the orthopaedic floor for postoperative care.  They were given PO and IV analgesics for pain control following their surgery.  They were given 24 hours of postoperative antibiotics of  Anti-infectives    Start     Dose/Rate Route Frequency Ordered Stop   02/10/15 1600  ceFAZolin (ANCEF) IVPB 1 g/50 mL premix     1 g 100 mL/hr over 30 Minutes Intravenous Every 6 hours 02/10/15  1353 02/11/15 0033   02/10/15 1100  polymyxin B 500,000 Units, bacitracin 50,000 Units in sodium chloride irrigation 0.9 % 500 mL irrigation  Status:  Discontinued       As needed 02/10/15 1155 02/10/15 1229   02/10/15 0841  ceFAZolin (ANCEF) IVPB 2 g/50 mL premix     2 g 100 mL/hr over 30 Minutes Intravenous On call to O.R. 02/10/15 [id number] 02/10/15 1045     and started on DVT prophylaxis in the form of Xarelto.   PT and OT were ordered for total joint protocol.  Discharge planning consulted to help with postop disposition and equipment needs.  Patient had a good night on the evening of surgery.  They started to get up OOB with therapy on day one. Hemovac drain was pulled without difficulty.  Continued to work with therapy into day two. The patient had progressed with therapy and meeting their goals. Patient was seen in rounds and was ready to go home.   Diet: Cardiac diet Activity:WBAT Follow-up:in 2 weeks Disposition - Skilled nursing facility Discharged Condition: stable   Discharge Instructions    Call MD / Call 911    Complete by:  As directed   If you experience chest pain or shortness of breath, CALL 911 and be transported to the hospital emergency room.  If you develope a fever above 101 F, pus (white drainage) or increased drainage or redness at the wound, or calf pain, call your surgeon's office.     Constipation Prevention    Complete by:  As directed   Drink plenty of fluids.  Prune juice may be helpful.  You may use a stool softener, such as Colace (over the counter) 100 mg twice a day.  Use MiraLax (over the counter) for constipation as needed.     Diet - low sodium heart healthy    Complete by:  As directed      Discharge instructions    Complete by:  As directed   INSTRUCTIONS AFTER JOINT REPLACEMENT   Remove items at home which could result in a fall. This includes throw rugs or furniture in walking pathways ICE to the affected joint every three hours while awake for 30  minutes at a time, for at least the first 3-5 days, and then as needed for pain and swelling.  Continue to use ice for pain and swelling. You may notice swelling that will progress down to the foot and ankle.  This is normal after surgery.  Elevate your leg when you are not up walking on it.   Continue to use the breathing machine you got in the hospital (incentive spirometer) which will help keep your temperature down.  It is common for your temperature to cycle up and down following surgery, especially at night when you are not up moving around and exerting yourself.  The breathing machine keeps your lungs expanded and your temperature down.   DIET:  As you were doing prior to hospitalization, we recommend a well-balanced diet.  DRESSING / WOUND  CARE / SHOWERING  Keep the surgical dressing until follow up.  The dressing is water proof, so you can shower without any extra covering.  IF THE DRESSING FALLS OFF or the wound gets wet inside, change the dressing with sterile gauze.  Please use good hand washing techniques before changing the dressing.  Do not use any lotions or creams on the incision until instructed by your surgeon.    ACTIVITY  Increase activity slowly as tolerated, but follow the weight bearing instructions below.   No driving for 6 weeks or until further direction given by your physician.  You cannot drive while taking narcotics.  No lifting or carrying greater than 10 lbs. until further directed by your surgeon. Avoid periods of inactivity such as sitting longer than an hour when not asleep. This helps prevent blood clots.  You may return to work once you are authorized by your doctor.     WEIGHT BEARING   Weight bearing as tolerated with assist device (walker, cane, etc) as directed, use it as long as suggested by your surgeon or therapist, typically at least 4-6 weeks.   EXERCISES  Results after joint replacement surgery are often greatly improved when you follow the  exercise, range of motion and muscle strengthening exercises prescribed by your doctor. Safety measures are also important to protect the joint from further injury. Any time any of these exercises cause you to have increased pain or swelling, decrease what you are doing until you are comfortable again and then slowly increase them. If you have problems or questions, call your caregiver or physical therapist for advice.   Rehabilitation is important following a joint replacement. After just a few days of immobilization, the muscles of the leg can become weakened and shrink (atrophy).  These exercises are designed to build up the tone and strength of the thigh and leg muscles and to improve motion. Often times heat used for twenty to thirty minutes before working out will loosen up your tissues and help with improving the range of motion but do not use heat for the first two weeks following surgery (sometimes heat can increase post-operative swelling).   These exercises can be done on a training (exercise) mat, on the floor, on a table or on a bed. Use whatever works the best and is most comfortable for you.    Use music or television while you are exercising so that the exercises are a pleasant break in your day. This will make your life better with the exercises acting as a break in your routine that you can look forward to.   Perform all exercises about fifteen times, three times per day or as directed.  You should exercise both the operative leg and the other leg as well.  Exercises include:   Quad Sets - Tighten up the muscle on the front of the thigh (Quad) and hold for 5-10 seconds.   Straight Leg Raises - With your knee straight (if you were given a brace, keep it on), lift the leg to 60 degrees, hold for 3 seconds, and slowly lower the leg.  Perform this exercise against resistance later as your leg gets stronger.  Leg Slides: Lying on your back, slowly slide your foot toward your buttocks, bending  your knee up off the floor (only go as far as is comfortable). Then slowly slide your foot back down until your leg is flat on the floor again.  Angel Wings: Lying on your back spread your legs  to the side as far apart as you can without causing discomfort.  Hamstring Strength:  Lying on your back, push your heel against the floor with your leg straight by tightening up the muscles of your buttocks.  Repeat, but this time bend your knee to a comfortable angle, and push your heel against the floor.  You may put a pillow under the heel to make it more comfortable if necessary.   A rehabilitation program following joint replacement surgery can speed recovery and prevent re-injury in the future due to weakened muscles. Contact your doctor or a physical therapist for more information on knee rehabilitation.    CONSTIPATION  Constipation is defined medically as fewer than three stools per week and severe constipation as less than one stool per week.  Even if you have a regular bowel pattern at home, your normal regimen is likely to be disrupted due to multiple reasons following surgery.  Combination of anesthesia, postoperative narcotics, change in appetite and fluid intake all can affect your bowels.   YOU MUST use at least one of the following options; they are listed in order of increasing strength to get the job done.  They are all available over the counter, and you may need to use some, POSSIBLY even all of these options:    Drink plenty of fluids (prune juice may be helpful) and high fiber foods Colace 100 mg by mouth twice a day  Senokot for constipation as directed and as needed Dulcolax (bisacodyl), take with full glass of water  Miralax (polyethylene glycol) once or twice a day as needed.  If you have tried all these things and are unable to have a bowel movement in the first 3-4 days after surgery call either your surgeon or your primary doctor.    If you experience loose stools or  diarrhea, hold the medications until you stool forms back up.  If your symptoms do not get better within 1 week or if they get worse, check with your doctor.  If you experience "the worst abdominal pain ever" or develop nausea or vomiting, please contact the office immediately for further recommendations for treatment.   ITCHING:  If you experience itching with your medications, try taking only a single pain pill, or even half a pain pill at a time.  You can also use Benadryl over the counter for itching or also to help with sleep.   TED HOSE STOCKINGS:  Use stockings on both legs until for at least 2 weeks or as directed by physician office. They may be removed at night for sleeping.  MEDICATIONS:  See your medication summary on the "After Visit Summary" that nursing will review with you.  You may have some home medications which will be placed on hold until you complete the course of blood thinner medication.  It is important for you to complete the blood thinner medication as prescribed.  PRECAUTIONS:  If you experience chest pain or shortness of breath - call 911 immediately for transfer to the hospital emergency department.   If you develop a fever greater that 101 F, purulent drainage from wound, increased redness or drainage from wound, foul odor from the wound/dressing, or calf pain - CONTACT YOUR SURGEON.                                                     FOLLOW-UP APPOINTMENTS:  If you do not already have a post-op appointment, please call the office for an appointment to be seen by your surgeon.  Guidelines for how soon to be seen are listed in your "After Visit Summary", but are typically between 1-4 weeks after surgery.   MAKE SURE YOU:  Understand these instructions.  Get help right away if you are not doing well or get worse.    Thank you for letting us be a part of your medical care team.  It is a privilege we respect greatly.  We hope these instructions will help you stay on  track for a fast and full recovery!     Increase activity slowly as tolerated    Complete by:  As directed             Medication List    STOP taking these medications        acetaminophen 500 MG tablet  Commonly known as:  TYLENOL      TAKE these medications        amiodarone 200 MG tablet  Commonly known as:  PACERONE  Take 0.5 tablets (100 mg total) by mouth 2 (two) times daily.     apixaban 5 MG Tabs tablet  Commonly known as:  ELIQUIS  Take 1 tablet (5 mg total) by mouth 2 (two) times daily.     AQUA GLYCOLIC HAND/BODY EX  Apply 1 application topically every morning.     bimatoprost 0.01 % Soln  Commonly known as:  LUMIGAN  Place 1 drop into both eyes at bedtime.     cyanocobalamin 1000 MCG/ML injection  Commonly known as:  (VITAMIN B-12)  Inject 1,000 mcg into the muscle every 30 (thirty) days.     HYDROcodone-acetaminophen 5-325 MG per tablet  Commonly known as:  NORCO/VICODIN  Take 1-2 tablets by mouth every 4 (four) hours as needed for moderate pain.     latanoprost 0.005 % ophthalmic solution  Commonly known as:  XALATAN  Place 1 drop into both eyes at bedtime.     multivitamin tablet  Take 1 tablet by mouth every morning.     nadolol 40 MG tablet  Commonly known as:  CORGARD  TAKE ONE TABLET BY MOUTH DAILY WITH BREAKFAST     nitroGLYCERIN 0.4 MG SL tablet  Commonly known as:  NITROSTAT  Place 1 tablet (0.4 mg total) under the tongue every 5 (five) minutes as needed for chest pain.     oxyCODONE-acetaminophen 5-325 MG per tablet  Commonly known as:  PERCOCET/ROXICET  Take 1-2 tablets by mouth every 4 (four) hours as needed for severe pain.     tiZANidine 4 MG tablet  Commonly known as:  ZANAFLEX  Take 1 tablet (4 mg total) by mouth every 8 (eight) hours as needed.           Follow-up Information    Follow up with GIOFFRE,RONALD A, MD. Schedule an appointment as soon as possible for a visit in 2 weeks.   Specialty:  Orthopedic Surgery    Contact information:   [address] [place] [address] [phone]       Signed: Ardeen Jourdain PA-C Orthopaedic Surgery 02/12/2015, 7:10 AM

## 2015-02-12 NOTE — Progress Notes (Signed)
Subjective: 2 Days Post-Op Procedure(s) (LRB): TOTAL RIGHT KNEE ARTHROPLASTY (Right) Patient reports pain as 2 on 0-10 scale. Doing very well this morning. Will DC   Objective: Vital signs in last 24 hours: Temp:  [97.7 F (36.5 C)-98.4 F (36.9 C)] 98.4 F (36.9 C) (09/02 4782) Pulse Rate:  [69-76] 75 (09/02 0613) Resp:  [16-18] 16 (09/02 0613) BP: (98-135)/(51-80) 108/54 mmHg (09/02 0613) SpO2:  [97 %-98 %] 97 % (09/02 0613)  Intake/Output from previous day: 09/01 0701 - 09/02 0700 In: 600 [P.O.:600] Out: 350 [Urine:350] Intake/Output this shift:     Recent Labs  02/11/15 0502 02/12/15 0530  HGB 9.7* 9.2*    Recent Labs  02/11/15 0502 02/12/15 0530  WBC 6.5 7.4  RBC 3.03* 2.81*  HCT 30.0* 27.9*  PLT 163 204    Recent Labs  02/11/15 0502 02/12/15 0530  NA 137 136  K 4.7 4.5  CL 103 101  CO2 28 28  BUN 11 13  CREATININE 0.65 0.67  GLUCOSE 156* 122*  CALCIUM 9.0 9.2   No results for input(s): LABPT, INR in the last 72 hours.  Dorsiflexion/Plantar flexion intact No cellulitis present Compartment soft  Assessment/Plan: 2 Days Post-Op Procedure(s) (LRB): TOTAL RIGHT KNEE ARTHROPLASTY (Right) Up with therapy Discharge to SNF  Waller Marcussen A 02/12/2015, 7:12 AM

## 2015-02-12 NOTE — Progress Notes (Signed)
Pt d/c to [facility]. Report given to [facility], Therapist, sports.

## 2015-02-17 ENCOUNTER — Non-Acute Institutional Stay (SKILLED_NURSING_FACILITY): Payer: Medicare Other | Admitting: Internal Medicine

## 2015-02-17 DIAGNOSIS — R29898 Other symptoms and signs involving the musculoskeletal system: Secondary | ICD-10-CM | POA: Diagnosis not present

## 2015-02-17 DIAGNOSIS — I480 Paroxysmal atrial fibrillation: Secondary | ICD-10-CM | POA: Diagnosis not present

## 2015-02-17 DIAGNOSIS — Z96651 Presence of right artificial knee joint: Secondary | ICD-10-CM

## 2015-02-17 NOTE — Progress Notes (Signed)
Patient ID: [name], female   DOB: 12/12/1928, 81 y.o.   MRN: [id number]  Facility; [facility] Chief complaint; admission to SNF post admit to [facility] from 8/31 to 9/2  History; this is a patient was admitted to hospital for primary osteoarthritis of the right knee and increasing pain and functional disability. She underwent an elective right total knee replacement. She had had prior procedures on the right need include arthroscopy and meniscectomy on the right. He would appear that the procedure went uneventfully. There was already a long called me by one of the nurses with regards to erythema and drainage in the knee. The patient was started on doxycycline. She still complains of pain and states she is having low-grade fevers.  Past Medical History  Diagnosis Date  . Arthritis   . Glaucoma   . Diverticulosis   . Mixed hyperlipidemia   . Essential hypertension, benign   . Colon polyps   . Paroxysmal atrial fibrillation   . Atrial flutter     s/p RFA 2003  . Sick sinus syndrome     s/p PPM (SJM)  . Varicose veins   . IBS (irritable bowel syndrome)   . Osteoporosis   . Urinary tract infection   . GERD (gastroesophageal reflux disease)   . DVT, lower extremity 1998 and 1999     bilateral  . Pneumonia 2011  . Breast cancer     1980's - lumpectomy only   Past Surgical History  Procedure Laterality Date  . Cholecystectomy    . Pacemaker insertion  2003  . Colonoscopy w/ biopsies and polypectomy  03/2003, 05/2008, 02/20/2011    severe diverticulosis, tubulovillous adenoma polyp, internal and external hemorrhoids 2012: severe diverticulosis, 4 mm polyp, hemorrhoids  . Cataracts Bilateral   . Fractured wrist Left 2013    repair  . Total knee arthroplasty  12/20/2011    Procedure: TOTAL KNEE ARTHROPLASTY;  Surgeon: Tobi Bastos, MD;  Location: [facility];  Service: Orthopedics;  Laterality: Left;  . Pacemaker generator change  06/18/12    SJM Accent DR RF, Dr Rayann Heman   . Cardiac catheterization  1999    Normal coronaries  . Permanent pacemaker generator change N/A 06/18/2012    Procedure: PERMANENT PACEMAKER GENERATOR CHANGE;  Surgeon: Thompson Grayer, MD;  Location: [facility] CATH LAB;  Service: Cardiovascular;  Laterality: N/A;  . Insert / replace / remove pacemaker    . Dilation and curettage of uterus    . Eye surgery    . Knee arthroscopy with medial menisectomy Right 10/21/2014    Procedure: RIGHT KNEE ARTHROSCOPY WITH MEDIAL MENISECTOM,lateral menisectomy,synovectomy suprapatellar pouch;  Surgeon: Latanya Maudlin, MD;  Location: [facility];  Service: Orthopedics;  Laterality: Right;  . Breast lumpectomy  1980's    Right breast   . Total knee arthroplasty Right 02/10/2015    Procedure: TOTAL RIGHT KNEE ARTHROPLASTY;  Surgeon: Latanya Maudlin, MD;  Location: [facility];  Service: Orthopedics;  Laterality: Right;   Current Outpatient Prescriptions on File Prior to Visit  Medication Sig Dispense Refill  . amiodarone (PACERONE) 200 MG tablet Take 0.5 tablets (100 mg total) by mouth 2 (two) times daily. 30 tablet 3  . apixaban (ELIQUIS) 5 MG TABS tablet Take 1 tablet (5 mg total) by mouth 2 (two) times daily. 60 tablet 6  . bimatoprost (LUMIGAN) 0.01 % SOLN Place 1 drop into both eyes at bedtime.    . cyanocobalamin (,VITAMIN B-12,) 1000 MCG/ML injection Inject 1,000 mcg into the  muscle every 30 (thirty) days.      . Emollient (AQUA GLYCOLIC HAND/BODY EX) Apply 1 application topically every morning.    Marland Kitchen HYDROcodone-acetaminophen (NORCO/VICODIN) 5-325 MG per tablet Take 1-2 tablets by mouth every 4 (four) hours as needed for moderate pain. 60 tablet 0  . latanoprost (XALATAN) 0.005 % ophthalmic solution Place 1 drop into both eyes at bedtime.    . Multiple Vitamin (MULTIVITAMIN) tablet Take 1 tablet by mouth every morning.     . nadolol (CORGARD) 40 MG tablet TAKE ONE TABLET BY MOUTH DAILY WITH BREAKFAST 30 tablet 6  . nitroGLYCERIN (NITROSTAT) 0.4 MG SL tablet Place 1 tablet  (0.4 mg total) under the tongue every 5 (five) minutes as needed for chest pain. 25 tablet 3  . oxyCODONE-acetaminophen (PERCOCET/ROXICET) 5-325 MG per tablet Take 1-2 tablets by mouth every 4 (four) hours as needed for severe pain. 60 tablet 0  . tiZANidine (ZANAFLEX) 4 MG tablet Take 1 tablet (4 mg total) by mouth every 8 (eight) hours as needed. 60 tablet 0    CBC Latest Ref Rng 02/12/2015 02/11/2015 02/03/2015  WBC 4.0 - 10.5 K/uL 7.4 6.5 5.4  Hemoglobin 12.0 - 15.0 g/dL 9.2(L) 9.7(L) 11.5(L)  Hematocrit 36.0 - 46.0 % 27.9(L) 30.0(L) 35.4(L)  Platelets 150 - 400 K/uL 204 163 236   Lab Results  Component Value Date   CREATININE 0.67 02/12/2015   CREATININE 0.65 02/11/2015   CREATININE 0.83 02/03/2015   . Lab Results  Component Value Date   NA 136 02/12/2015   K 4.5 02/12/2015   CL 101 02/12/2015   CO2 28 02/12/2015    Social; patient states she lives in her own home and [place]. Independent with ADLs and IDL's. A son lives next door  reports that she has never smoked. She has never used smokeless tobacco. She reports that she does not drink alcohol or use illicit drugs.  family history includes Cancer in her father; Colon cancer in her brother and other; Heart attack in an other family member; Stroke in her mother.  Review of systems Gen. patient states she might may be running a mild fever HEENT no oral pain or sore throat Respiratory no shortness of breath Cardiac states she has a pacemaker but no exertional chest pain GI no abdominal pain or diarrhea GU no dysuria Musculoskeletal; states the pain in her operative knee is actually better when she is up on it rather than lying down. Neurologic does not complain of focal weakness  Physical examination Gen. the patient is not in any distress Vitals blood pressure 130/72 respirations 18 temperature 90.8 pulse 80 O2 sat is 96% on room air HEENT; no oral lesions are seen Respiratory; clear air entry  bilaterally Cardiac heart sounds are normal no murmurs she appears to be euvolemic Abdomen no liver no spleen no tenderness no masses GU bladder is not distended no CVA tenderness Extremities; no evidence of a DVT pulses are intact. Some degree of stasis physiology. Neurologic; she has no pronator drift 4 out of 5 strength in the left hip flexor 3 out of 5 on the right Musculoskeletal; in spite of the note I received from the nurses I think everything looks very stable here. The incision looks fine there is no redness no erythema and no infection and no evidence of any drainage or DVT. Mental status; I see no abnormalities here  Impression/plan #1 status post elective right total knee replacement. This looks very stable the patient is already  on Eliquis for I believe atrial arrhythmia prophylaxis. #2 history of atrial fibrillation on amiodarone and Eliquis this seems stable. #3 suggestion over the weekend called to her on call about an infection here there is non-, the doxycycline is unnecessary in all stop it. #4 I note she has had history of DVT however there is no evidence of this currently and she is already on Eliquis #5 proximal lower extremity weakness. I think this is disuse and should respond to physical therapy especially as the pain in her knee improves.

## 2015-02-24 ENCOUNTER — Non-Acute Institutional Stay (SKILLED_NURSING_FACILITY): Payer: Medicare Other | Admitting: Internal Medicine

## 2015-02-24 DIAGNOSIS — R29898 Other symptoms and signs involving the musculoskeletal system: Secondary | ICD-10-CM | POA: Diagnosis not present

## 2015-02-24 DIAGNOSIS — I480 Paroxysmal atrial fibrillation: Secondary | ICD-10-CM

## 2015-02-24 DIAGNOSIS — Z96651 Presence of right artificial knee joint: Secondary | ICD-10-CM

## 2015-02-27 DIAGNOSIS — Z471 Aftercare following joint replacement surgery: Secondary | ICD-10-CM | POA: Diagnosis not present

## 2015-02-27 DIAGNOSIS — I10 Essential (primary) hypertension: Secondary | ICD-10-CM | POA: Diagnosis not present

## 2015-02-27 DIAGNOSIS — Z96651 Presence of right artificial knee joint: Secondary | ICD-10-CM | POA: Diagnosis not present

## 2015-02-27 DIAGNOSIS — M6281 Muscle weakness (generalized): Secondary | ICD-10-CM | POA: Diagnosis not present

## 2015-03-01 NOTE — Progress Notes (Signed)
Patient ID: [name], female   DOB: [birth year], 81 y.o.   MRN: [id number]                PROGRESS NOTE  DATE:  02/24/2015           FACILITY: [facility]             LEVEL OF CARE:   SNF   Acute Visit/Discharge Visit   CHIEF COMPLAINT:  Pre-discharge review.      HISTORY OF PRESENT ILLNESS:  This is a patient who was admitted to hospital from 02/10/2015 through 02/12/2015.  She underwent an elective right total knee replacement.     There was some concern initially when she came into this facility about infection, although I certainly did not see this when I saw her a week ago.  No antibiotics were required.    The patient has a history of paroxysmal atrial fibrillation and sick sinus syndrome, status post permanent pacemaker.    She also has a history of bilateral DVTs.   She has remained on Eliquis for this.  I did not change any of these orders.    CURRENT MEDICATIONS:  Medication list is reviewed.    REVIEW OF SYSTEMS:    CHEST/RESPIRATORY:  No shortness of breath.   CARDIAC:  No chest pain.    PHYSICAL EXAMINATION:   VITAL SIGNS:     TEMPERATURE:  99.     PULSE:  83, atrial fibrillation.     RESPIRATIONS:  18.     BLOOD PRESSURE:  112/71.    GENERAL APPEARANCE:  The patient appears to be very stable.   CHEST/RESPIRATORY:  Clear air entry bilaterally.    CARDIOVASCULAR:   CARDIAC:  Heart sounds are atrial fibrillation.  Her heart rate is controlled.       GASTROINTESTINAL:   ABDOMEN:  Soft, nontender.     CIRCULATION:   EDEMA/VARICOSITIES:  Extremities:  Changes of venous stasis.  No evidence of a DVT.   MUSCULOSKELETAL:   EXTREMITIES:   RIGHT LOWER EXTREMITY:  Right knee incision looks really quite stable.    No evidence of infection.     ASSESSMENT/PLAN:                     Status post right total knee replacement.   This looks really very stable.   No evidence of infection.  She is ready to go home.    Postoperative anemia.  Stable, with a  recent hemoglobin at 10.2 in the facility.    Bilateral lower extremity weakness when I saw her initially.  She is able to bring herself to a standing position and walk with a walker.    Paroxysmal Atrial Fib: not an issue while she was here. On chronic eliquis   The patient is going home with her son, who is present.  She has a walker at home from previous knee issues.   We will send her home with PT, OT, and an Therapist, sports.  No DME is required.  I wrote her scripts for Percocet.     CPT CODE: 59935

## 2015-03-10 ENCOUNTER — Ambulatory Visit: Payer: Medicare Other | Attending: Orthopedic Surgery | Admitting: Physical Therapy

## 2015-03-10 DIAGNOSIS — M25562 Pain in left knee: Secondary | ICD-10-CM | POA: Insufficient documentation

## 2015-03-10 DIAGNOSIS — M25662 Stiffness of left knee, not elsewhere classified: Secondary | ICD-10-CM | POA: Insufficient documentation

## 2015-03-10 DIAGNOSIS — R609 Edema, unspecified: Secondary | ICD-10-CM | POA: Diagnosis present

## 2015-03-10 NOTE — Therapy (Signed)
[facility] [place], [place], [address] Phone: [phone]   Fax:  [phone]  Physical Therapy Evaluation  Patient Details  Name: [name] MRN: [id number] Date of Birth: [birth year] Referring Provider:  Latanya Maudlin, MD  Encounter Date: 03/10/2015      PT End of Session - 03/10/15 0932    Visit Number 1   Number of Visits 12   Date for PT Re-Evaluation 04/21/15   PT Start Time 0906   PT Stop Time 0951   PT Time Calculation (min) 45 min   Equipment Utilized During Treatment --  FWW.   Activity Tolerance Patient tolerated treatment well   Behavior During Therapy [facility] for tasks assessed/performed      Past Medical History  Diagnosis Date  . Arthritis   . Glaucoma   . Diverticulosis   . Mixed hyperlipidemia   . Essential hypertension, benign   . Colon polyps   . Paroxysmal atrial fibrillation   . Atrial flutter     s/p RFA 2003  . Sick sinus syndrome     s/p PPM (SJM)  . Varicose veins   . IBS (irritable bowel syndrome)   . Osteoporosis   . Urinary tract infection   . GERD (gastroesophageal reflux disease)   . DVT, lower extremity 1998 and 1999     bilateral  . Pneumonia 2011  . Breast cancer     1980's - lumpectomy only    Past Surgical History  Procedure Laterality Date  . Cholecystectomy    . Pacemaker insertion  2003  . Colonoscopy w/ biopsies and polypectomy  03/2003, 05/2008, 02/20/2011    severe diverticulosis, tubulovillous adenoma polyp, internal and external hemorrhoids 2012: severe diverticulosis, 4 mm polyp, hemorrhoids  . Cataracts Bilateral   . Fractured wrist Left 2013    repair  . Total knee arthroplasty  12/20/2011    Procedure: TOTAL KNEE ARTHROPLASTY;  Surgeon: Tobi Bastos, MD;  Location: [facility];  Service: Orthopedics;  Laterality: Left;  . Pacemaker generator change  06/18/12    SJM Accent DR RF, Dr Rayann Heman  . Cardiac catheterization  1999    Normal coronaries  .  Permanent pacemaker generator change N/A 06/18/2012    Procedure: PERMANENT PACEMAKER GENERATOR CHANGE;  Surgeon: Thompson Grayer, MD;  Location: [facility] CATH LAB;  Service: Cardiovascular;  Laterality: N/A;  . Insert / replace / remove pacemaker    . Dilation and curettage of uterus    . Eye surgery    . Knee arthroscopy with medial menisectomy Right 10/21/2014    Procedure: RIGHT KNEE ARTHROSCOPY WITH MEDIAL MENISECTOM,lateral menisectomy,synovectomy suprapatellar pouch;  Surgeon: Latanya Maudlin, MD;  Location: [facility];  Service: Orthopedics;  Laterality: Right;  . Breast lumpectomy  1980's    Right breast   . Total knee arthroplasty Right 02/10/2015    Procedure: TOTAL RIGHT KNEE ARTHROPLASTY;  Surgeon: Latanya Maudlin, MD;  Location: [facility];  Service: Orthopedics;  Laterality: Right;    There were no vitals filed for this visit.  Visit Diagnosis:  Left knee pain - Plan: PT plan of care cert/re-cert  Knee stiffness, left - Plan: PT plan of care cert/re-cert  Edema - Plan: PT plan of care cert/re-cert      Subjective Assessment - 03/10/15 0911    Subjective Pain around a 3-4/10 today.   Limitations Walking   How long can you walk comfortably? 10-15 minutes.   Currently in Pain? Yes   Pain Score 4  Pain Location Knee   Pain Orientation Right   Pain Descriptors / Indicators Aching   Pain Type Surgical pain   Pain Onset More than a month ago   Pain Frequency Constant   Aggravating Factors  Bending right knee.   Pain Relieving Factors Rest.            [facility] PT Assessment - 03/10/15 0001    Assessment   Medical Diagnosis Right total knee replacement.   Onset Date/Surgical Date --  02/10/15 (surgery date).   Precautions   Precautions --  PACEMAKER.  No U/S.   Restrictions   Weight Bearing Restrictions No   Balance Screen   Has the patient fallen in the past 6 months No   Has the patient had a decrease in activity level because of a fear of falling?  Yes   Is the patient  reluctant to leave their home because of a fear of falling?  No   Home Environment   Living Environment Private residence   Home Access --  One step in house.   Prior Function   Level of Independence Independent with household mobility with device   Observation/Other Assessments-Edema    Edema Circumferential   Circumferential Edema   Circumferential - Right 4.5 cms > left.   ROM / Strength   AROM / PROM / Strength AROM;PROM;Strength   AROM   Overall AROM Comments -11 degrees to 112 degrees.   PROM   Overall PROM Comments -5 degrees to 118 degrees.   Strength   Overall Strength Comments Right hip strength= 4-/5 and right knee= 4/5.   Palpation   Palpation comment Tender around left kneecap.   Ambulation/Gait   Gait Comments Patient ambulating with a FWW with SBA.                   [facility] PT Treatment/Exercise - 03/10/15 0001    Modalities   Modalities Vasopneumatic   Vasopneumatic   Number Minutes Vasopneumatic  20 minutes   Vasopnuematic Location  --  Right knee.   Vasopneumatic Pressure Medium                  PT Short Term Goals - 03/10/15 0935    PT SHORT TERM GOAL #1   Title Ind with an HEP.   Time 2   Period Weeks   Status New   PT SHORT TERM GOAL #2   Title Achieve full active right kne extesnion.   Time 2   Period Weeks   Status New           PT Long Term Goals - 03/10/15 0936    PT LONG TERM GOAL #1   Title Active right knee flxion= 125 degrees.   Time 4   Period Weeks   Status New   PT LONG TERM GOAL #2   Title Perform  ADL's with pain not > 2-3/10.   Time 4   Period Weeks   Status New   PT LONG TERM GOAL #3   Title Perform a reciprocating stair gait wiht pain not > 2-3/10.   Time 4   Period Weeks   Status New   PT LONG TERM GOAL #4   Title Walk with a straight cane in clinic 500 feet with supervision only.               Plan - 03/10/15 0911    Clinical Impression Statement The patient underwent a  right total knee replacement  on 02/10/15.  Patient had [facility] and is compliant to those exercises.  Resting pain-level is a 3-4/10 and 6-7/10 with bending.   Pt will benefit from skilled therapeutic intervention in order to improve on the following deficits Pain;Decreased activity tolerance;Increased edema;Decreased strength;Decreased range of motion   Rehab Potential Excellent   PT Frequency 3x / week   PT Duration 4 weeks   PT Treatment/Interventions ADLs/Self Care Home Management;Cryotherapy;Therapeutic exercise;Therapeutic activities;Neuromuscular re-education;Patient/family education;Manual techniques;Passive range of motion;Vasopneumatic Device   PT Next Visit Plan [place]; stationary bike; Right LE strengthening.  Achieve fullright knee extension early on.  Total knee protocol progression.          G-Codes - 2015-03-29 2992    Functional Assessment Tool Used FOTO.   Functional Limitation Mobility: Walking and moving around   Mobility: Walking and Moving Around Current Status [phone]) At least 60 percent but less than 80 percent impaired, limited or restricted   Mobility: Walking and Moving Around Goal Status [phone]) At least 20 percent but less than 40 percent impaired, limited or restricted       Problem List Patient Active Problem List   Diagnosis Date Noted  . History of total knee arthroplasty 02/10/2015  . Chest pain 04/18/2014  . [place] anemia 12/08/2013  . A-fib 12/08/2013  . Sick sinus syndrome 03/22/2011  . Hyperlipidemia 02/02/2009  . Essential hypertension, benign 02/02/2009  . Paroxysmal atrial fibrillation 05/15/2008  . COLONIC POLYPS, ADENOMATOUS, HX OF 05/15/2008    APPLEGATE, [place] MPT 03-29-15, 9:51 AM  [facility] [address] [place], [place], [address] Phone: [phone]   Fax:  [phone]

## 2015-03-16 ENCOUNTER — Encounter: Payer: Self-pay | Admitting: Physical Therapy

## 2015-03-16 ENCOUNTER — Ambulatory Visit: Payer: Medicare Other | Attending: Orthopedic Surgery | Admitting: Physical Therapy

## 2015-03-16 DIAGNOSIS — M25662 Stiffness of left knee, not elsewhere classified: Secondary | ICD-10-CM | POA: Insufficient documentation

## 2015-03-16 DIAGNOSIS — M25562 Pain in left knee: Secondary | ICD-10-CM | POA: Insufficient documentation

## 2015-03-16 DIAGNOSIS — M25661 Stiffness of right knee, not elsewhere classified: Secondary | ICD-10-CM | POA: Diagnosis present

## 2015-03-16 DIAGNOSIS — R600 Localized edema: Secondary | ICD-10-CM | POA: Diagnosis present

## 2015-03-16 DIAGNOSIS — M25561 Pain in right knee: Secondary | ICD-10-CM | POA: Insufficient documentation

## 2015-03-16 NOTE — Therapy (Signed)
[facility] [place], [place], [address] Phone: [phone]   Fax:  [phone]  Physical Therapy Treatment  Patient Details  Name: [name] MRN: [id number] Date of Birth: [birth year] Referring Provider:  Dione Housekeeper, MD  Encounter Date: 03/16/2015      PT End of Session - 03/16/15 0953    Visit Number 2   Number of Visits 12   Date for PT Re-Evaluation 04/21/15   PT Start Time 0950   PT Stop Time 1040   PT Time Calculation (min) 50 min   Activity Tolerance Patient tolerated treatment well   Behavior During Therapy [facility] for tasks assessed/performed      Past Medical History  Diagnosis Date  . Arthritis   . Glaucoma   . Diverticulosis   . Mixed hyperlipidemia   . Essential hypertension, benign   . Colon polyps   . Paroxysmal atrial fibrillation (HCC)   . Atrial flutter ([place])     s/p RFA 2003  . Sick sinus syndrome (HCC)     s/p PPM (SJM)  . Varicose veins   . IBS (irritable bowel syndrome)   . Osteoporosis   . Urinary tract infection   . GERD (gastroesophageal reflux disease)   . DVT, lower extremity ([place]) 1998 and 1999     bilateral  . Pneumonia 2011  . Breast cancer ([place])     1980's - lumpectomy only    Past Surgical History  Procedure Laterality Date  . Cholecystectomy    . Pacemaker insertion  2003  . Colonoscopy w/ biopsies and polypectomy  03/2003, 05/2008, 02/20/2011    severe diverticulosis, tubulovillous adenoma polyp, internal and external hemorrhoids 2012: severe diverticulosis, 4 mm polyp, hemorrhoids  . Cataracts Bilateral   . Fractured wrist Left 2013    repair  . Total knee arthroplasty  12/20/2011    Procedure: TOTAL KNEE ARTHROPLASTY;  Surgeon: Tobi Bastos, MD;  Location: [facility];  Service: Orthopedics;  Laterality: Left;  . Pacemaker generator change  06/18/12    SJM [employer] DR RF, Dr Rayann Heman  . Cardiac catheterization  1999    Normal coronaries  . Permanent pacemaker  generator change N/A 06/18/2012    Procedure: PERMANENT PACEMAKER GENERATOR CHANGE;  Surgeon: Thompson Grayer, MD;  Location: [facility] CATH LAB;  Service: Cardiovascular;  Laterality: N/A;  . Insert / replace / remove pacemaker    . Dilation and curettage of uterus    . Eye surgery    . Knee arthroscopy with medial menisectomy Right 10/21/2014    Procedure: RIGHT KNEE ARTHROSCOPY WITH MEDIAL MENISECTOM,lateral menisectomy,synovectomy suprapatellar pouch;  Surgeon: Latanya Maudlin, MD;  Location: [facility];  Service: Orthopedics;  Laterality: Right;  . Breast lumpectomy  1980's    Right breast   . Total knee arthroplasty Right 02/10/2015    Procedure: TOTAL RIGHT KNEE ARTHROPLASTY;  Surgeon: Latanya Maudlin, MD;  Location: [facility];  Service: Orthopedics;  Laterality: Right;    There were no vitals filed for this visit.  Visit Diagnosis:  Left knee pain  Knee stiffness, left      Subjective Assessment - 03/16/15 0952    Subjective Patient reports that her knee wants to hurt this morning "for some reason" and she reports increased pain at night.   Limitations Walking   How long can you walk comfortably? 10-15 minutes.   Currently in Pain? Yes   Pain Score 3    Pain Location Knee   Pain Orientation Right  Pain Descriptors / Indicators Sore   Pain Type Surgical pain   Pain Onset More than a month ago            [facility] PT Assessment - 03/16/15 0001    Assessment   Medical Diagnosis Right total knee replacement.   Onset Date/Surgical Date 02/10/15   Next MD Visit 03/31/2015  Dr. Gladstone Lighter   ROM / Strength   AROM / PROM / Strength PROM   PROM   Overall PROM  Within functional limits for tasks performed   PROM Assessment Site Knee   Right/Left Knee Right   Right Knee Extension 0   Right Knee Flexion 123                     [facility] Adult PT Treatment/Exercise - 03/16/15 0001    Exercises   Exercises Knee/Hip   Knee/Hip Exercises: Stretches   Active Hamstring Stretch Right;3  reps;30 seconds   Knee/Hip Exercises: Aerobic   Nustep L4 x12 min   Knee/Hip Exercises: Standing   Forward Lunges Right;2 sets;10 reps;3 seconds  on 6" step   Rocker Board 3 minutes   Knee/Hip Exercises: Seated   Long Arc Quad Strengthening;Right;2 sets;10 reps;Weights   Long Arc Quad Weight 2 lbs.   Modalities   Modalities Vasopneumatic   Vasopneumatic   Number Minutes Vasopneumatic  15 minutes   Vasopnuematic Location  Knee   Vasopneumatic Pressure Medium   Vasopneumatic Temperature  34   Manual Therapy   Manual Therapy Passive ROM;Soft tissue mobilization   Soft tissue mobilization R patellar mobilizations into L/R, sup/inf; R incision mobilzations down length of incison in circles, L/R   Passive ROM R knee PROM into flex/ext with gentle holds at end range with occasional oscillations for relaxation                  PT Short Term Goals - 03/10/15 0935    PT SHORT TERM GOAL #1   Title Ind with an HEP.   Time 2   Period Weeks   Status New   PT SHORT TERM GOAL #2   Title Achieve full active right kne extesnion.   Time 2   Period Weeks   Status New           PT Long Term Goals - 03/10/15 0936    PT LONG TERM GOAL #1   Title Active right knee flxion= 125 degrees.   Time 4   Period Weeks   Status New   PT LONG TERM GOAL #2   Title Perform  ADL's with pain not > 2-3/10.   Time 4   Period Weeks   Status New   PT LONG TERM GOAL #3   Title Perform a reciprocating stair gait wiht pain not > 2-3/10.   Time 4   Period Weeks   Status New   PT LONG TERM GOAL #4   Title Walk with a straight cane in clinic 500 feet with supervision only.               Plan - 03/16/15 1043    Clinical Impression Statement Patient tolerated treatment well today with no complaints of increased pain except for a little increase in R laterosuperior knee following forward R lunges. Completed all other exercises well and with proper technique following multimodal cueing.  Firm end feels noted during R knee PROM and presented with facial grimacing at end range of flexion. Demonstrated good R patellar mobility and  has some incision stiffness in the lower aspect of the incision. Normal modaliity response noted following removal of the modaliity. Denied pain following today's treatment.   Pt will benefit from skilled therapeutic intervention in order to improve on the following deficits Pain;Decreased activity tolerance;Increased edema;Decreased strength;Decreased range of motion   Rehab Potential Excellent   PT Frequency 3x / week   PT Duration 4 weeks   PT Treatment/Interventions ADLs/Self Care Home Management;Cryotherapy;Therapeutic exercise;Therapeutic activities;Neuromuscular re-education;Patient/family education;Manual techniques;Passive range of motion;Vasopneumatic Device   PT Next Visit Plan NUstep; stationary bike; Right LE strengthening.  Achieve fullright knee extension early on.  Total knee protocol progression.   Consulted and Agree with Plan of Care Patient        Problem List Patient Active Problem List   Diagnosis Date Noted  . History of total knee arthroplasty 02/10/2015  . Chest pain 04/18/2014  . [place] anemia 12/08/2013  . A-fib ([place]) 12/08/2013  . Sick sinus syndrome ([place]) 03/22/2011  . Hyperlipidemia 02/02/2009  . Essential hypertension, benign 02/02/2009  . Paroxysmal atrial fibrillation ([place]) 05/15/2008  . COLONIC POLYPS, ADENOMATOUS, HX OF 05/15/2008    Wynelle Fanny, PTA 03/16/2015, 10:48 AM  [facility] [address] [place], [place], [address] Phone: [phone]   Fax:  [phone]

## 2015-03-18 ENCOUNTER — Encounter: Payer: Self-pay | Admitting: *Deleted

## 2015-03-18 ENCOUNTER — Ambulatory Visit: Payer: Medicare Other | Admitting: *Deleted

## 2015-03-18 ENCOUNTER — Ambulatory Visit (INDEPENDENT_AMBULATORY_CARE_PROVIDER_SITE_OTHER): Payer: Medicare Other | Admitting: *Deleted

## 2015-03-18 DIAGNOSIS — R600 Localized edema: Secondary | ICD-10-CM

## 2015-03-18 DIAGNOSIS — M25561 Pain in right knee: Secondary | ICD-10-CM

## 2015-03-18 DIAGNOSIS — I495 Sick sinus syndrome: Secondary | ICD-10-CM | POA: Diagnosis not present

## 2015-03-18 DIAGNOSIS — M25661 Stiffness of right knee, not elsewhere classified: Secondary | ICD-10-CM

## 2015-03-18 DIAGNOSIS — M25562 Pain in left knee: Secondary | ICD-10-CM | POA: Diagnosis not present

## 2015-03-18 NOTE — Progress Notes (Signed)
Remote pacemaker transmission.   

## 2015-03-18 NOTE — Therapy (Signed)
[facility] [place], [place], [address] Phone: [phone]   Fax:  [phone]  Physical Therapy Treatment  Patient Details  Name: [name] MRN: [id number] Date of Birth: 1929/05/28 Referring Provider:  Dione Housekeeper, MD  Encounter Date: 03/18/2015      PT End of Session - 03/18/15 1000    Visit Number 3   Number of Visits 12   Date for PT Re-Evaluation 04/21/15   PT Start Time 0950   PT Stop Time 1049   PT Time Calculation (min) 59 min      Past Medical History  Diagnosis Date  . Arthritis   . Glaucoma   . Diverticulosis   . Mixed hyperlipidemia   . Essential hypertension, benign   . Colon polyps   . Paroxysmal atrial fibrillation (HCC)   . Atrial flutter ([place])     s/p RFA 2003  . Sick sinus syndrome (HCC)     s/p PPM (SJM)  . Varicose veins   . IBS (irritable bowel syndrome)   . Osteoporosis   . Urinary tract infection   . GERD (gastroesophageal reflux disease)   . DVT, lower extremity ([place]) 1998 and 1999     bilateral  . Pneumonia 2011  . Breast cancer ([place])     1980's - lumpectomy only    Past Surgical History  Procedure Laterality Date  . Cholecystectomy    . Pacemaker insertion  2003  . Colonoscopy w/ biopsies and polypectomy  03/2003, 05/2008, 02/20/2011    severe diverticulosis, tubulovillous adenoma polyp, internal and external hemorrhoids 2012: severe diverticulosis, 4 mm polyp, hemorrhoids  . Cataracts Bilateral   . Fractured wrist Left 2013    repair  . Total knee arthroplasty  12/20/2011    Procedure: TOTAL KNEE ARTHROPLASTY;  Surgeon: Tobi Bastos, MD;  [facility]: [facility];  Service: Orthopedics;  Laterality: Left;  . Pacemaker generator change  06/18/12    SJM Acc[employer] DR RF, Dr Rayann Heman  . Cardiac catheterization  1999    Normal coronaries  . Permanent pacemaker generator change N/A 06/18/2012    Procedure: PERMANENT PACEMAKER GENERATOR CHANGE;  Surgeon: Thompson Grayer, MD;   Location: [facility] CATH LAB;  Service: Cardiovascular;  Laterality: N/A;  . Insert / replace / remove pacemaker    . Dilation and curettage of uterus    . Eye surgery    . Knee arthroscopy with medial menisectomy Right 10/21/2014    Procedure: RIGHT KNEE ARTHROSCOPY WITH MEDIAL MENISECTOM,lateral menisectomy,synovectomy suprapatellar pouch;  Surgeon: Latanya Maudlin, MD;  [facility]: [facility];  Service: Orthopedics;  Laterality: Right;  . Breast lumpectomy  1980's    Right breast   . Total knee arthroplasty Right 02/10/2015    Procedure: TOTAL RIGHT KNEE ARTHROPLASTY;  Surgeon: Latanya Maudlin, MD;  [facility]: [facility];  Service: Orthopedics;  Laterality: Right;    There were no vitals filed for this visit.  Visit Diagnosis:  Right knee pain  Knee stiffness, right  Localized edema      Subjective Assessment - 03/18/15 0958    Subjective RT knee    Currently in Pain? Yes   Pain Score 2    Pain Location Knee   Pain Orientation Right   Pain Descriptors / Indicators Sore   Pain Onset More than a month ago   Pain Frequency Constant   Aggravating Factors  bending   Pain Relieving Factors rest  [place] Adult PT Treatment/Exercise - 03/18/15 0001    Exercises   Exercises Knee/Hip   Knee/Hip Exercises: Aerobic   Nustep L4 x78min seat progression for ROM 11,10   Knee/Hip Exercises: Standing   Forward Lunges Right;2 sets;10 reps;3 seconds  on 6" step   Rocker Board 5 minutes  calf stretching and balance   Knee/Hip Exercises: Seated   Long Arc Quad Strengthening;Right  2#2x10,3# x10   Modalities   Modalities Vasopneumatic   Vasopneumatic   Number Minutes Vasopneumatic  15 minutes   Vasopnuematic Location  Knee   Vasopneumatic Pressure Medium   Vasopneumatic Temperature  34   Manual Therapy   Manual Therapy Passive ROM;Soft tissue mobilization   Soft tissue mobilization R patellar mobilizations into L/R, sup/inf; R incision mobilzations down length of  incison in circles, L/R   Passive ROM R knee PROM into flex/ext with gentle holds at end range with occasional oscillations for relaxation                  PT Short Term Goals - 03/10/15 0935    PT SHORT TERM GOAL #1   Title Ind with an HEP.   Time 2   Period Weeks   Status New   PT SHORT TERM GOAL #2   Title Achieve full active right kne extesnion.   Time 2   Period Weeks   Status New           PT Long Term Goals - 03/10/15 0936    PT LONG TERM GOAL #1   Title Active right knee flxion= 125 degrees.   Time 4   Period Weeks   Status New   PT LONG TERM GOAL #2   Title Perform  ADL's with pain not > 2-3/10.   Time 4   Period Weeks   Status New   PT LONG TERM GOAL #3   Title Perform a reciprocating stair gait wiht pain not > 2-3/10.   Time 4   Period Weeks   Status New   PT LONG TERM GOAL #4   Title Walk with a straight cane in clinic 500 feet with supervision only.               Plan - 03/18/15 1000    Clinical Impression Statement Pt did well today and had increased PROM for knee extension to 0 degrees end of Rx. Her Flexion ROM continues to improve, but unable to meet goals yet. She has good patella mobilty and scar mobility as well except distal 1/4 is still tight. Goals are ongoing. Normal modality respone     Pt will benefit from skilled therapeutic intervention in order to improve on the following deficits Pain;Decreased activity tolerance;Increased edema;Decreased strength;Decreased range of motion   Rehab Potential Excellent   PT Frequency 3x / week   PT Duration 4 weeks   PT Next Visit Plan NUstep; stationary bike; Right LE strengthening.  Achieve fullright knee extension early on.  Total knee protocol progression.   Consulted and Agree with Plan of Care Patient        Problem List Patient Active Problem List   Diagnosis Date Noted  . History of total knee arthroplasty 02/10/2015  . Chest pain 04/18/2014  . [place] anemia 12/08/2013   . A-fib ([place]) 12/08/2013  . Sick sinus syndrome ([place]) 03/22/2011  . Hyperlipidemia 02/02/2009  . Essential hypertension, benign 02/02/2009  . Paroxysmal atrial fibrillation ([place]) 05/15/2008  . COLONIC POLYPS, ADENOMATOUS, HX OF 05/15/2008  Jalan Bodi,CHRIS, PTA 03/18/2015, 10:54 AM  [facility] [address] [place], [place], [address] Phone: [phone]   Fax:  [phone]

## 2015-03-18 NOTE — Therapy (Signed)
[facility] [place], [place], [address] Phone: [phone]   Fax:  [phone]  Physical Therapy Treatment  Patient Details  Name: [name] MRN: [id number] Date of Birth: 1929-06-06 Referring Provider:  Dione Housekeeper, MD  Encounter Date: 03/18/2015      PT End of Session - 03/18/15 1000    Visit Number 3   Number of Visits 12   Date for PT Re-Evaluation 04/21/15   PT Start Time 0950      Past Medical History  Diagnosis Date  . Arthritis   . Glaucoma   . Diverticulosis   . Mixed hyperlipidemia   . Essential hypertension, benign   . Colon polyps   . Paroxysmal atrial fibrillation (HCC)   . Atrial flutter ([place])     s/p RFA 2003  . Sick sinus syndrome (HCC)     s/p PPM (SJM)  . Varicose veins   . IBS (irritable bowel syndrome)   . Osteoporosis   . Urinary tract infection   . GERD (gastroesophageal reflux disease)   . DVT, lower extremity ([place]) 1998 and 1999     bilateral  . Pneumonia 2011  . Breast cancer ([place])     1980's - lumpectomy only    Past Surgical History  Procedure Laterality Date  . Cholecystectomy    . Pacemaker insertion  2003  . Colonoscopy w/ biopsies and polypectomy  03/2003, 05/2008, 02/20/2011    severe diverticulosis, tubulovillous adenoma polyp, internal and external hemorrhoids 2012: severe diverticulosis, 4 mm polyp, hemorrhoids  . Cataracts Bilateral   . Fractured wrist Left 2013    repair  . Total knee arthroplasty  12/20/2011    Procedure: TOTAL KNEE ARTHROPLASTY;  Surgeon: Tobi Bastos, MD;  [facility]: [facility];  Service: Orthopedics;  Laterality: Left;  . Pacemaker generator change  06/18/12    SJM Acc[employer] DR RF, Dr Rayann Heman  . Cardiac catheterization  1999    Normal coronaries  . Permanent pacemaker generator change N/A 06/18/2012    Procedure: PERMANENT PACEMAKER GENERATOR CHANGE;  Surgeon: Thompson Grayer, MD;  Location: [facility];  Service: Cardiovascular;   Laterality: N/A;  . Insert / replace / remove pacemaker    . Dilation and curettage of uterus    . Eye surgery    . Knee arthroscopy with medial menisectomy Right 10/21/2014    Procedure: RIGHT KNEE ARTHROSCOPY WITH MEDIAL MENISECTOM,lateral menisectomy,synovectomy suprapatellar pouch;  Surgeon: Latanya Maudlin, MD;  [facility]: [facility];  Service: Orthopedics;  Laterality: Right;  . Breast lumpectomy  1980's    Right breast   . Total knee arthroplasty Right 02/10/2015    Procedure: TOTAL RIGHT KNEE ARTHROPLASTY;  Surgeon: Latanya Maudlin, MD;  [facility]: [facility];  Service: Orthopedics;  Laterality: Right;    There were no vitals filed for this visit.  Visit Diagnosis:  Right knee pain  Knee stiffness, right  Localized edema      Subjective Assessment - 03/18/15 0958    Subjective RT knee    Currently in Pain? Yes   Pain Score 2    Pain Location Knee   Pain Orientation Right   Pain Descriptors / Indicators Sore   Pain Onset More than a month ago   Pain Frequency Constant   Aggravating Factors  bending   Pain Relieving Factors rest                         [facility] Adult PT Treatment/Exercise -  03/18/15 0001    Exercises   Exercises Knee/Hip   Knee/Hip Exercises: Aerobic   Nustep L4 x56min seat progression for ROM 11,10   Knee/Hip Exercises: Standing   Forward Lunges Right;2 sets;10 reps;3 seconds  on 6" step   Rocker Board 5 minutes  calf stretching and balance   Knee/Hip Exercises: Seated   Long Arc Quad Strengthening;Right  2#2x10,3# x10   Modalities   Modalities Vasopneumatic   Vasopneumatic   Number Minutes Vasopneumatic  15 minutes   Vasopnuematic Location  Knee   Vasopneumatic Pressure Medium   Vasopneumatic Temperature  34   Manual Therapy   Manual Therapy Passive ROM;Soft tissue mobilization   Soft tissue mobilization R patellar mobilizations into L/R, sup/inf; R incision mobilzations down length of incison in circles, L/R   Passive ROM R knee PROM  into flex/ext with gentle holds at end range with occasional oscillations for relaxation                  PT Short Term Goals - 03/10/15 0935    PT SHORT TERM GOAL #1   Title Ind with an HEP.   Time 2   Period Weeks   Status New   PT SHORT TERM GOAL #2   Title Achieve full active right kne extesnion.   Time 2   Period Weeks   Status New           PT Long Term Goals - 03/10/15 0936    PT LONG TERM GOAL #1   Title Active right knee flxion= 125 degrees.   Time 4   Period Weeks   Status New   PT LONG TERM GOAL #2   Title Perform  ADL's with pain not > 2-3/10.   Time 4   Period Weeks   Status New   PT LONG TERM GOAL #3   Title Perform a reciprocating stair gait wiht pain not > 2-3/10.   Time 4   Period Weeks   Status New   PT LONG TERM GOAL #4   Title Walk with a straight cane in clinic 500 feet with supervision only.               Plan - 03/18/15 1000    Clinical Impression Statement Pt did well today and had increased PROM for knee extension to 0 degrees end of Rx. Her Flexion ROM continues to improve, but unable to meet goals yet. She has good patella mobilty and scar mobility as well except distal 1/4 is still tight. Goals are ongoing. Normal modality respone     Pt will benefit from skilled therapeutic intervention in order to improve on the following deficits Pain;Decreased activity tolerance;Increased edema;Decreased strength;Decreased range of motion   Rehab Potential Excellent   PT Frequency 3x / week   PT Duration 4 weeks   PT Next Visit Plan NUstep; stationary bike; Right LE strengthening.  Achieve fullright knee extension early on.  Total knee protocol progression.   Consulted and Agree with Plan of Care Patient        Problem List Patient Active Problem List   Diagnosis Date Noted  . History of total knee arthroplasty 02/10/2015  . Chest pain 04/18/2014  . [place] anemia 12/08/2013  . A-fib ([place]) 12/08/2013  . Sick sinus syndrome  ([place]) 03/22/2011  . Hyperlipidemia 02/02/2009  . Essential hypertension, benign 02/02/2009  . Paroxysmal atrial fibrillation ([place]) 05/15/2008  . COLONIC POLYPS, ADENOMATOUS, HX OF 05/15/2008    RAMSEUR,CHRIS, PTA 03/18/2015, 10:43  [facility] [address] [place], [place], [address] Phone: [phone]   Fax:  [phone]

## 2015-03-19 LAB — CUP PACEART REMOTE DEVICE CHECK
Brady Statistic AP VP Percent: 15 %
Brady Statistic AP VS Percent: 72 %
Brady Statistic AS VP Percent: 9.6 %
Brady Statistic AS VS Percent: 2.9 %
Brady Statistic RV Percent Paced: 77 %
Date Time Interrogation Session: 20161006075218
Lead Channel Impedance Value: 480 Ohm
Lead Channel Pacing Threshold Amplitude: 1 V
Lead Channel Pacing Threshold Pulse Width: 0.5 ms
Lead Channel Sensing Intrinsic Amplitude: 10.3 mV
Lead Channel Setting Pacing Amplitude: 2 V
Lead Channel Setting Pacing Amplitude: 2.5 V
Lead Channel Setting Pacing Pulse Width: 0.5 ms
MDC IDC MSMT BATTERY REMAINING LONGEVITY: 111 mo
MDC IDC MSMT BATTERY REMAINING PERCENTAGE: 95.5 %
MDC IDC MSMT BATTERY VOLTAGE: 2.98 V
MDC IDC MSMT LEADCHNL RA IMPEDANCE VALUE: 350 Ohm
MDC IDC MSMT LEADCHNL RA PACING THRESHOLD AMPLITUDE: 1 V
MDC IDC MSMT LEADCHNL RA PACING THRESHOLD PULSEWIDTH: 0.5 ms
MDC IDC MSMT LEADCHNL RA SENSING INTR AMPL: 0.3 mV
MDC IDC PG SERIAL: 7438567
MDC IDC SET LEADCHNL RV SENSING SENSITIVITY: 2 mV
MDC IDC STAT BRADY RA PERCENT PACED: 13 %

## 2015-03-23 ENCOUNTER — Ambulatory Visit: Payer: Medicare Other | Admitting: Physical Therapy

## 2015-03-23 DIAGNOSIS — M25562 Pain in left knee: Secondary | ICD-10-CM | POA: Diagnosis not present

## 2015-03-23 DIAGNOSIS — R600 Localized edema: Secondary | ICD-10-CM

## 2015-03-23 DIAGNOSIS — M25661 Stiffness of right knee, not elsewhere classified: Secondary | ICD-10-CM

## 2015-03-23 DIAGNOSIS — M25561 Pain in right knee: Secondary | ICD-10-CM

## 2015-03-23 NOTE — Therapy (Signed)
[facility] [place], [place], [address] Phone: [phone]   Fax:  [phone]  Physical Therapy Treatment  Patient Details  Name: [name] MRN: [id number] Date of Birth: [birth year] Referring Provider:  Dione Housekeeper, MD  Encounter Date: 03/23/2015      PT End of Session - 03/23/15 1134    Visit Number 4   Number of Visits 12   Date for PT Re-Evaluation 04/21/15   PT Start Time 1120   PT Stop Time 1213   PT Time Calculation (min) 53 min   Activity Tolerance Patient tolerated treatment well   Behavior During Therapy [facility] for tasks assessed/performed      Past Medical History  Diagnosis Date  . Arthritis   . Glaucoma   . Diverticulosis   . Mixed hyperlipidemia   . Essential hypertension, benign   . Colon polyps   . Paroxysmal atrial fibrillation (HCC)   . Atrial flutter ([place])     s/p RFA 2003  . Sick sinus syndrome (HCC)     s/p PPM (SJM)  . Varicose veins   . IBS (irritable bowel syndrome)   . Osteoporosis   . Urinary tract infection   . GERD (gastroesophageal reflux disease)   . DVT, lower extremity ([place]) 1998 and 1999     bilateral  . Pneumonia 2011  . Breast cancer ([place])     1980's - lumpectomy only    Past Surgical History  Procedure Laterality Date  . Cholecystectomy    . Pacemaker insertion  2003  . Colonoscopy w/ biopsies and polypectomy  03/2003, 05/2008, 02/20/2011    severe diverticulosis, tubulovillous adenoma polyp, internal and external hemorrhoids 2012: severe diverticulosis, 4 mm polyp, hemorrhoids  . Cataracts Bilateral   . Fractured wrist Left 2013    repair  . Total knee arthroplasty  12/20/2011    Procedure: TOTAL KNEE ARTHROPLASTY;  Surgeon: Tobi Bastos, MD;  [facility]: [facility];  Service: Orthopedics;  Laterality: Left;  . Pacemaker generator change  06/18/12    SJM Accent DR RF, Dr Rayann Heman  . Cardiac catheterization  1999    Normal coronaries  . Permanent  pacemaker generator change N/A 06/18/2012    Procedure: PERMANENT PACEMAKER GENERATOR CHANGE;  Surgeon: Thompson Grayer, MD;  Location: [facility] CATH LAB;  Service: Cardiovascular;  Laterality: N/A;  . Insert / replace / remove pacemaker    . Dilation and curettage of uterus    . Eye surgery    . Knee arthroscopy with medial menisectomy Right 10/21/2014    Procedure: RIGHT KNEE ARTHROSCOPY WITH MEDIAL MENISECTOM,lateral menisectomy,synovectomy suprapatellar pouch;  Surgeon: Latanya Maudlin, MD;  [facility];  Service: Orthopedics;  Laterality: Right;  . Breast lumpectomy  1980's    Right breast   . Total knee arthroplasty Right 02/10/2015    Procedure: TOTAL RIGHT KNEE ARTHROPLASTY;  Surgeon: Latanya Maudlin, MD;  [facility];  Service: Orthopedics;  Laterality: Right;    There were no vitals filed for this visit.  Visit Diagnosis:  Knee stiffness, right  Localized edema  Right knee pain      Subjective Assessment - 03/23/15 1133    Subjective No complaints today.   Currently in Pain? Yes   Pain Score 1    Pain Location Knee   Pain Orientation Right                         [place] Adult PT Treatment/Exercise - 03/23/15  0001    Knee/Hip Exercises: Aerobic   Stationary Bike L1 x 5 min seat 6   Nustep L4 x10 min seat progression for ROM 11,10   Knee/Hip Exercises: Standing   Forward Lunges 15 reps  on 14 inch step   Forward Step Up 2 sets;10 reps;Hand Hold: 2;Step Height: 6"   Rocker Board 3 minutes   Modalities   Modalities Vasopneumatic   Vasopneumatic   Number Minutes Vasopneumatic  15 minutes   Vasopnuematic Location  Knee   Vasopneumatic Pressure Medium   Vasopneumatic Temperature  34   Manual Therapy   Manual Therapy Passive ROM;Soft tissue mobilization   Passive ROM R knee PROM into ext with gentle holds at end range with occasional oscillations for relaxation                  PT Short Term Goals - 03/10/15 0935    PT SHORT TERM GOAL #1    Title Ind with an HEP.   Time 2   Period Weeks   Status New   PT SHORT TERM GOAL #2   Title Achieve full active right kne extesnion.   Time 2   Period Weeks   Status New           PT Long Term Goals - 03/10/15 0936    PT LONG TERM GOAL #1   Title Active right knee flxion= 125 degrees.   Time 4   Period Weeks   Status New   PT LONG TERM GOAL #2   Title Perform  ADL's with pain not > 2-3/10.   Time 4   Period Weeks   Status New   PT LONG TERM GOAL #3   Title Perform a reciprocating stair gait wiht pain not > 2-3/10.   Time 4   Period Weeks   Status New   PT LONG TERM GOAL #4   Title Walk with a straight cane in clinic 500 feet with supervision only.               Plan - 03/23/15 1204    Clinical Impression Statement Patient did well with step ups today, but fatigued by end of second set. No increase in pain. Doing great with ROM. Able to complete full revolution on bike.   PT Next Visit Plan NUstep; stationary bike; flex/ext machines; hip flexion strengthening. Assess goals. Total knee protocol progression.   Consulted and Agree with Plan of Care Patient        Problem List Patient Active Problem List   Diagnosis Date Noted  . History of total knee arthroplasty 02/10/2015  . Chest pain 04/18/2014  . Addison anemia 12/08/2013  . A-fib ([place]) 12/08/2013  . Sick sinus syndrome ([place]) 03/22/2011  . Hyperlipidemia 02/02/2009  . Essential hypertension, benign 02/02/2009  . Paroxysmal atrial fibrillation ([place]) 05/15/2008  . COLONIC POLYPS, ADENOMATOUS, HX OF 05/15/2008    Madelyn Flavors PT  03/23/2015, 12:16 PM  [facility] [address] [place], [place], [address] Phone: [phone]   Fax:  [phone]

## 2015-03-25 ENCOUNTER — Ambulatory Visit: Payer: Medicare Other | Admitting: *Deleted

## 2015-03-25 ENCOUNTER — Encounter: Payer: Self-pay | Admitting: *Deleted

## 2015-03-25 DIAGNOSIS — M25562 Pain in left knee: Secondary | ICD-10-CM | POA: Diagnosis not present

## 2015-03-25 DIAGNOSIS — M25561 Pain in right knee: Secondary | ICD-10-CM

## 2015-03-25 DIAGNOSIS — R600 Localized edema: Secondary | ICD-10-CM

## 2015-03-25 DIAGNOSIS — M25661 Stiffness of right knee, not elsewhere classified: Secondary | ICD-10-CM

## 2015-03-25 NOTE — Therapy (Signed)
[facility] [place], [place], [address] Phone: [phone]   Fax:  [phone]  Physical Therapy Treatment  Patient Details  Name: [name] MRN: [id number] Date of Birth: [birth year] Referring Provider:  Dione Housekeeper, MD  Encounter Date: 03/25/2015      PT End of Session - 03/25/15 1010    Visit Number 5   Number of Visits 12   Date for PT Re-Evaluation 04/21/15   PT Start Time 0946   PT Stop Time 9758   PT Time Calculation (min) 58 min      Past Medical History  Diagnosis Date  . Arthritis   . Glaucoma   . Diverticulosis   . Mixed hyperlipidemia   . Essential hypertension, benign   . Colon polyps   . Paroxysmal atrial fibrillation (HCC)   . Atrial flutter ([place])     s/p RFA 2003  . Sick sinus syndrome (HCC)     s/p PPM (SJM)  . Varicose veins   . IBS (irritable bowel syndrome)   . Osteoporosis   . Urinary tract infection   . GERD (gastroesophageal reflux disease)   . DVT, lower extremity ([place]) 1998 and 1999     bilateral  . Pneumonia 2011  . Breast cancer ([place])     1980's - lumpectomy only    Past Surgical History  Procedure Laterality Date  . Cholecystectomy    . Pacemaker insertion  2003  . Colonoscopy w/ biopsies and polypectomy  03/2003, 05/2008, 02/20/2011    severe diverticulosis, tubulovillous adenoma polyp, internal and external hemorrhoids 2012: severe diverticulosis, 4 mm polyp, hemorrhoids  . Cataracts Bilateral   . Fractured wrist Left 2013    repair  . Total knee arthroplasty  12/20/2011    Procedure: TOTAL KNEE ARTHROPLASTY;  Surgeon: Tobi Bastos, MD;  [facility];  Service: Orthopedics;  Laterality: Left;  . Pacemaker generator change  06/18/12    SJM Accent DR RF, Dr Rayann Heman  . Cardiac catheterization  1999    Normal coronaries  . Permanent pacemaker generator change N/A 06/18/2012    Procedure: PERMANENT PACEMAKER GENERATOR CHANGE;  Surgeon: Thompson Grayer, MD;   Location: [facility] CATH LAB;  Service: Cardiovascular;  Laterality: N/A;  . Insert / replace / remove pacemaker    . Dilation and curettage of uterus    . Eye surgery    . Knee arthroscopy with medial menisectomy Right 10/21/2014    Procedure: RIGHT KNEE ARTHROSCOPY WITH MEDIAL MENISECTOM,lateral menisectomy,synovectomy suprapatellar pouch;  Surgeon: Latanya Maudlin, MD;  [facility];  Service: Orthopedics;  Laterality: Right;  . Breast lumpectomy  1980's    Right breast   . Total knee arthroplasty Right 02/10/2015    Procedure: TOTAL RIGHT KNEE ARTHROPLASTY;  Surgeon: Latanya Maudlin, MD;  [facility];  Service: Orthopedics;  Laterality: Right;    There were no vitals filed for this visit.  Visit Diagnosis:  Knee stiffness, right  Localized edema  Right knee pain      Subjective Assessment - 03/25/15 0955    Subjective RT knee pain is worse at night. Doing ok. Having  some RT ankle pain   Limitations Walking   How long can you walk comfortably? 10-15 minutes.   Currently in Pain? Yes   Pain Score 2    Pain Location Knee   Pain Orientation Right   Pain Descriptors / Indicators Sore   Pain Type Surgical pain   Pain Onset More than a month  ago   Pain Frequency Constant   Aggravating Factors  bending it   Pain Relieving Factors rest, Ice ,e-stim                         [facility] Adult PT Treatment/Exercise - 03/25/15 0001    Exercises   Exercises Knee/Hip   Knee/Hip Exercises: Aerobic   Nustep L4 x15  min seat progression for ROM 11,10   Knee/Hip Exercises: Standing   Forward Lunges 15 reps  on 14 inch step   Forward Step Up 2 sets;10 reps;Hand Hold: 2;Step Height: 6"   Rocker Board 3 minutes   Knee/Hip Exercises: Seated   Long Arc Quad Strengthening;Right  3# 3x10   Long Arc Quad Weight 3 lbs.   Modalities   Modalities Vasopneumatic   Vasopneumatic   Number Minutes Vasopneumatic  15 minutes   Vasopnuematic Location  Knee   Vasopneumatic Pressure  Medium   Vasopneumatic Temperature  34   Manual Therapy   Manual Therapy Passive ROM;Soft tissue mobilization   Soft tissue mobilization R patellar mobilizations into L/R, sup/inf; R incision mobilzations down length of incison in circles, L/R   Passive ROM R knee PROM into ext with gentle holds at end range with occasional oscillations for relaxation                                                                                                          Balance on Airex pad              PT Short Term Goals - 03/25/15 1042    PT SHORT TERM GOAL #1   Title Ind with an HEP.   Time 2   Period Weeks   Status Achieved   PT SHORT TERM GOAL #2   Title Achieve full active right kne extesnion.   Time 2   Period Weeks   Status On-going           PT Long Term Goals - 03/25/15 1043    PT LONG TERM GOAL #1   Title Active right knee flxion= 125 degrees.   Time 4   Period Weeks   Status On-going   PT LONG TERM GOAL #2   Title Perform  ADL's with pain not > 2-3/10.   Time 4   Period Weeks   Status On-going   PT LONG TERM GOAL #3   Title Perform a reciprocating stair gait wiht pain not > 2-3/10.   Time 4   Period Weeks   Status On-going   PT LONG TERM GOAL #4   Title Walk with a straight cane in clinic 500 feet with supervision only.   Status On-going               Plan - 03/25/15 1024    Clinical Impression Statement Pt did well today with Rx and was able to reach PROM for ext to 0 degrees and AROM for flexion to 120 degrees(LTG is 125). She still needs to progress with balance and stregthening exs  and acts. She uses a SPC to ambulate, but will still hold on to other objects to help.,    Pt will benefit from skilled therapeutic intervention in order to improve on the following deficits Pain;Decreased activity tolerance;Increased edema;Decreased strength;Decreased range of motion   Rehab Potential Excellent   PT Frequency 3x / week   PT Duration 4 weeks   PT  Treatment/Interventions ADLs/Self Care Home Management;Cryotherapy;Therapeutic exercise;Therapeutic activities;Neuromuscular re-education;Patient/family education;Manual techniques;Passive range of motion;Vasopneumatic Device   PT Next Visit Plan NUstep; stationary bike; flex/ext machines; hip flexion strengthening. Assess goals. Total knee protocol progression. Balance   Consulted and Agree with Plan of Care Patient        Problem List Patient Active Problem List   Diagnosis Date Noted  . History of total knee arthroplasty 02/10/2015  . Chest pain 04/18/2014  . [place] anemia 12/08/2013  . A-fib ([place]) 12/08/2013  . Sick sinus syndrome ([place]) 03/22/2011  . Hyperlipidemia 02/02/2009  . Essential hypertension, benign 02/02/2009  . Paroxysmal atrial fibrillation ([place]) 05/15/2008  . COLONIC POLYPS, ADENOMATOUS, HX OF 05/15/2008    RAMSEUR,CHRIS, PTA 03/25/2015, 10:50 AM  [facility] [address] [place], [place], [address] Phone: [phone]   Fax:  [phone]

## 2015-03-29 ENCOUNTER — Encounter: Payer: Self-pay | Admitting: Physical Therapy

## 2015-03-29 ENCOUNTER — Encounter: Payer: Self-pay | Admitting: *Deleted

## 2015-03-30 ENCOUNTER — Ambulatory Visit: Payer: Self-pay | Admitting: Cardiology

## 2015-04-02 ENCOUNTER — Ambulatory Visit: Payer: Medicare Other | Admitting: Physical Therapy

## 2015-04-02 DIAGNOSIS — M25562 Pain in left knee: Secondary | ICD-10-CM | POA: Diagnosis not present

## 2015-04-02 DIAGNOSIS — M25661 Stiffness of right knee, not elsewhere classified: Secondary | ICD-10-CM

## 2015-04-02 DIAGNOSIS — M25561 Pain in right knee: Secondary | ICD-10-CM

## 2015-04-02 NOTE — Therapy (Signed)
[facility] [place], [place], [address] Phone: [phone]   Fax:  [phone]  Physical Therapy Treatment  Patient Details  Name: [name] MRN: [id number] Date of Birth: 1929/01/27 No Data Recorded  Encounter Date: 04/02/2015    Past Medical History  Diagnosis Date  . Arthritis   . Glaucoma   . Diverticulosis   . Mixed hyperlipidemia   . Essential hypertension, benign   . Colon polyps   . Paroxysmal atrial fibrillation (HCC)   . Atrial flutter ([place])     s/p RFA 2003  . Sick sinus syndrome (HCC)     s/p PPM (SJM)  . Varicose veins   . IBS (irritable bowel syndrome)   . Osteoporosis   . Urinary tract infection   . GERD (gastroesophageal reflux disease)   . DVT, lower extremity ([place]) 1998 and 1999     bilateral  . Pneumonia 2011  . Breast cancer ([place])     1980's - lumpectomy only    Past Surgical History  Procedure Laterality Date  . Cholecystectomy    . Pacemaker insertion  2003  . Colonoscopy w/ biopsies and polypectomy  03/2003, 05/2008, 02/20/2011    severe diverticulosis, tubulovillous adenoma polyp, internal and external hemorrhoids 2012: severe diverticulosis, 4 mm polyp, hemorrhoids  . Cataracts Bilateral   . Fractured wrist Left 2013    repair  . Total knee arthroplasty  12/20/2011    Procedure: TOTAL KNEE ARTHROPLASTY;  Surgeon: Tobi Bastos, MD;  [facility];  Service: Orthopedics;  Laterality: Left;  . Pacemaker generator change  06/18/12    SJM Accent DR RF, Dr Rayann Heman  . Cardiac catheterization  1999    Normal coronaries  . Permanent pacemaker generator change N/A 06/18/2012    Procedure: PERMANENT PACEMAKER GENERATOR CHANGE;  Surgeon: Thompson Grayer, MD;  Location: [facility] CATH LAB;  Service: Cardiovascular;  Laterality: N/A;  . Insert / replace / remove pacemaker    . Dilation and curettage of uterus    . Eye surgery    . Knee arthroscopy with medial menisectomy Right 10/21/2014    Procedure: RIGHT KNEE ARTHROSCOPY WITH MEDIAL MENISECTOM,lateral menisectomy,synovectomy suprapatellar pouch;  Surgeon: Latanya Maudlin, MD;  [facility];  Service: Orthopedics;  Laterality: Right;  . Breast lumpectomy  1980's    Right breast   . Total knee arthroplasty Right 02/10/2015    Procedure: TOTAL RIGHT KNEE ARTHROPLASTY;  Surgeon: Latanya Maudlin, MD;  [facility];  Service: Orthopedics;  Laterality: Right;    There were no vitals filed for this visit.  Visit Diagnosis:  Knee stiffness, right  Right knee pain                                 PT Short Term Goals - 03/25/15 1042    PT SHORT TERM GOAL #1   Title Ind with an HEP.   Time 2   Period Weeks   Status Achieved   PT SHORT TERM GOAL #2   Title Achieve full active right kne extesnion.   Time 2   Period Weeks   Status On-going           PT Long Term Goals - 03/25/15 1043    PT LONG TERM GOAL #1   Title Active right knee flxion= 125 degrees.   Time 4   Period Weeks   Status On-going   PT LONG TERM GOAL #  2   Title Perform  ADL's with pain not > 2-3/10.   Time 4   Period Weeks   Status On-going   PT LONG TERM GOAL #3   Title Perform a reciprocating stair gait wiht pain not > 2-3/10.   Time 4   Period Weeks   Status On-going   PT LONG TERM GOAL #4   Title Walk with a straight cane in clinic 500 feet with supervision only.   Status On-going               Problem List Patient Active Problem List   Diagnosis Date Noted  . History of total knee arthroplasty 02/10/2015  . Chest pain 04/18/2014  . Addison anemia 12/08/2013  . A-fib ([place]) 12/08/2013  . Sick sinus syndrome ([place]) 03/22/2011  . Hyperlipidemia 02/02/2009  . Essential hypertension, benign 02/02/2009  . Paroxysmal atrial fibrillation ([place]) 05/15/2008  . COLONIC POLYPS, ADENOMATOUS, HX OF 05/15/2008   Treatment:  Nustep level 4 x 22 minutes f/b left knee PROM x 6 minutes f/b Medium vasopneumatic  x 15 minutes.   APPLEGATE, Mali MPT 04/02/2015, 12:44 PM  [facility] [address] [place], [place], [address] Phone: [phone]   Fax:  [phone]  Name: [name] MRN: [id number] Date of Birth: April 15, 1929

## 2015-04-07 ENCOUNTER — Ambulatory Visit: Payer: Medicare Other | Admitting: Physical Therapy

## 2015-04-07 ENCOUNTER — Encounter: Payer: Self-pay | Admitting: Physical Therapy

## 2015-04-07 DIAGNOSIS — M25562 Pain in left knee: Secondary | ICD-10-CM | POA: Diagnosis not present

## 2015-04-07 DIAGNOSIS — M25661 Stiffness of right knee, not elsewhere classified: Secondary | ICD-10-CM

## 2015-04-07 DIAGNOSIS — M25561 Pain in right knee: Secondary | ICD-10-CM

## 2015-04-07 DIAGNOSIS — R600 Localized edema: Secondary | ICD-10-CM

## 2015-04-07 NOTE — Therapy (Signed)
[facility] [place], [place], [address] Phone: [phone]   Fax:  [phone]  Physical Therapy Treatment  Patient Details  Name: [name] MRN: [id number] Date of Birth: Jul 01, 1928 Referring Provider: Dr. Gladstone Lighter  Encounter Date: 04/07/2015      PT End of Session - 04/07/15 1442    Visit Number 7   Number of Visits 12   Date for PT Re-Evaluation 04/21/15   PT Start Time 8003   PT Stop Time 1529   PT Time Calculation (min) 53 min   Activity Tolerance Patient tolerated treatment well   Behavior During Therapy [facility] for tasks assessed/performed      Past Medical History  Diagnosis Date  . Arthritis   . Glaucoma   . Diverticulosis   . Mixed hyperlipidemia   . Essential hypertension, benign   . Colon polyps   . Paroxysmal atrial fibrillation (HCC)   . Atrial flutter ([place])     s/p RFA 2003  . Sick sinus syndrome (HCC)     s/p PPM (SJM)  . Varicose veins   . IBS (irritable bowel syndrome)   . Osteoporosis   . Urinary tract infection   . GERD (gastroesophageal reflux disease)   . DVT, lower extremity ([place]) 1998 and 1999     bilateral  . Pneumonia 2011  . Breast cancer ([place])     1980's - lumpectomy only    Past Surgical History  Procedure Laterality Date  . Cholecystectomy    . Pacemaker insertion  2003  . Colonoscopy w/ biopsies and polypectomy  03/2003, 05/2008, 02/20/2011    severe diverticulosis, tubulovillous adenoma polyp, internal and external hemorrhoids 2012: severe diverticulosis, 4 mm polyp, hemorrhoids  . Cataracts Bilateral   . Fractured wrist Left 2013    repair  . Total knee arthroplasty  12/20/2011    Procedure: TOTAL KNEE ARTHROPLASTY;  Surgeon: Tobi Bastos, MD;  [facility];  Service: Orthopedics;  Laterality: Left;  . Pacemaker generator change  06/18/12    SJM Accent DR RF, Dr Rayann Heman  . Cardiac catheterization  1999    Normal coronaries  . Permanent pacemaker  generator change N/A 06/18/2012    Procedure: PERMANENT PACEMAKER GENERATOR CHANGE;  Surgeon: Thompson Grayer, MD;  Location: [facility] CATH LAB;  Service: Cardiovascular;  Laterality: N/A;  . Insert / replace / remove pacemaker    . Dilation and curettage of uterus    . Eye surgery    . Knee arthroscopy with medial menisectomy Right 10/21/2014    Procedure: RIGHT KNEE ARTHROSCOPY WITH MEDIAL MENISECTOM,lateral menisectomy,synovectomy suprapatellar pouch;  Surgeon: Latanya Maudlin, MD;  [facility];  Service: Orthopedics;  Laterality: Right;  . Breast lumpectomy  1980's    Right breast   . Total knee arthroplasty Right 02/10/2015    Procedure: TOTAL RIGHT KNEE ARTHROPLASTY;  Surgeon: Latanya Maudlin, MD;  [facility];  Service: Orthopedics;  Laterality: Right;    There were no vitals filed for this visit.  Visit Diagnosis:  Knee stiffness, right  Right knee pain  Localized edema      Subjective Assessment - 04/07/15 1442    Subjective Reports R knee is doing good today. Reports that she went to Dr. Irving Shows regarding her ankle and he said she had arthritis throughout R foot per patient report.   Limitations Walking   How long can you walk comfortably? 10-15 minutes.   Currently in Pain? Yes   Pain Score 1  Pain Location Knee   Pain Orientation Right   Pain Descriptors / Indicators Sore   Pain Type Surgical pain   Pain Onset More than a month ago            [facility] PT Assessment - 04/07/15 0001    Assessment   Medical Diagnosis Right total knee replacement.   Referring Provider Dr. Gladstone Lighter   Onset Date/Surgical Date 02/10/15   Next MD Visit 04/30/2015   ROM / Strength   AROM / PROM / Strength AROM   AROM   Overall AROM  Within functional limits for tasks performed   AROM Assessment Site Knee   Right/Left Knee Right   Right Knee Extension 0   Right Knee Flexion 127   Palpation   Patella mobility Demonstrated good R patellar mobility in all directions                      [facility] Adult PT Treatment/Exercise - 04/07/15 0001    Knee/Hip Exercises: Aerobic   Nustep L4 x15  min, seat 10   Knee/Hip Exercises: Standing   Forward Lunges Right;2 sets;10 reps  14" step   Forward Step Up Right;3 sets;10 reps;Step Height: 6";Hand Hold: 1   Rocker Board 3 minutes   Other Standing Knee Exercises R standing HS curl 3# x20 reps   Knee/Hip Exercises: Seated   Long Arc Quad Strengthening;Right;2 sets;10 reps;Weights   Long Arc Quad Weight 3 lbs.   Modalities   Modalities Vasopneumatic   Vasopneumatic   Number Minutes Vasopneumatic  15 minutes   Vasopnuematic Location  Knee   Vasopneumatic Pressure Medium   Vasopneumatic Temperature  34   Manual Therapy   Manual Therapy Passive ROM;Soft tissue mobilization   Soft tissue mobilization R patellar mobilizations into L/R, sup/inf; R incision mobilzations down length of incison in circles, L/R   Passive ROM R knee PROM into flex/ext with gentle holds at end range                  PT Short Term Goals - 04/07/15 1517    PT SHORT TERM GOAL #1   Title Ind with an HEP.   Time 2   Period Weeks   Status Achieved   PT SHORT TERM GOAL #2   Title Achieve full active right kne extesnion.   Time 2   Period Weeks   Status Achieved  R knee ext AROM 0 deg 04/07/2015           PT Long Term Goals - 04/07/15 1516    PT LONG TERM GOAL #1   Title Active right knee flxion= 125 degrees.   Time 4   Period Weeks   Status Achieved  R knee AROM flex 127 deg 04/07/2015   PT LONG TERM GOAL #2   Title Perform  ADL's with pain not > 2-3/10.   Time 4   Period Weeks   Status Achieved   PT LONG TERM GOAL #3   Title Perform a reciprocating stair gait wiht pain not > 2-3/10.   Time 4   Period Weeks   Status On-going   PT LONG TERM GOAL #4   Title Walk with a straight cane in clinic 500 feet with supervision only.   Status On-going               Plan - 04/07/15 1518    Clinical  Impression Statement Patient tolerated today's treatment well with only complaint of medial R  knee tenderness during R patellar mobilizations. AROM of R knee measured as 0-127 deg in supine. Has achieved all ST goals at this time. Only remaining LT goals involve stair ambulation and ambulation with SPC for 531ft. Patient presents in [facility] with [facility] with minimal increased R knee inflammation. Demonstrates good R patella mobility and R incison mobility with minimal R superior incision pinching. Normal vasopneumatic response noted following removal of the modality. Denied pain following today's treatment.   Pt will benefit from skilled therapeutic intervention in order to improve on the following deficits Pain;Decreased activity tolerance;Increased edema;Decreased strength;Decreased range of motion   Rehab Potential Excellent   PT Frequency 3x / week   PT Duration 4 weeks   PT Treatment/Interventions ADLs/Self Care Home Management;Cryotherapy;Therapeutic exercise;Therapeutic activities;Neuromuscular re-education;Patient/family education;Manual techniques;Passive range of motion;Vasopneumatic Device   PT Next Visit Plan Initate next treatment with stationary bike and progress strengthening. Assess stair and gait goal next session.   Consulted and Agree with Plan of Care Patient        Problem List Patient Active Problem List   Diagnosis Date Noted  . History of total knee arthroplasty 02/10/2015  . Chest pain 04/18/2014  . Addison anemia 12/08/2013  . A-fib ([place]) 12/08/2013  . Sick sinus syndrome ([place]) 03/22/2011  . Hyperlipidemia 02/02/2009  . Essential hypertension, benign 02/02/2009  . Paroxysmal atrial fibrillation ([place]) 05/15/2008  . COLONIC POLYPS, ADENOMATOUS, HX OF 05/15/2008    Wynelle Fanny, PTA 04/07/2015, 3:32 PM  [facility] [place], [place], [address] Phone: [phone]   Fax:  [phone]  Name: [name] MRN: [id number] Date of Birth: 1928-09-05

## 2015-04-09 ENCOUNTER — Encounter: Payer: Self-pay | Admitting: *Deleted

## 2015-04-09 ENCOUNTER — Ambulatory Visit: Payer: Medicare Other | Admitting: *Deleted

## 2015-04-09 DIAGNOSIS — M25561 Pain in right knee: Secondary | ICD-10-CM

## 2015-04-09 DIAGNOSIS — M25562 Pain in left knee: Secondary | ICD-10-CM | POA: Diagnosis not present

## 2015-04-09 DIAGNOSIS — R600 Localized edema: Secondary | ICD-10-CM

## 2015-04-09 DIAGNOSIS — M25661 Stiffness of right knee, not elsewhere classified: Secondary | ICD-10-CM

## 2015-04-09 NOTE — Therapy (Signed)
[facility] [place], [place], [address] Phone: [phone]   Fax:  [phone]  Physical Therapy Treatment  Patient Details  Name: [name] MRN: [id number] Date of Birth: Oct 09, 1928 Referring Provider: Dr. Gladstone Lighter  Encounter Date: 04/09/2015      PT End of Session - 04/09/15 1011    Visit Number 8   Number of Visits 12   Date for PT Re-Evaluation 04/21/15   PT Start Time 0948   PT Stop Time 8101   PT Time Calculation (min) 59 min      Past Medical History  Diagnosis Date  . Arthritis   . Glaucoma   . Diverticulosis   . Mixed hyperlipidemia   . Essential hypertension, benign   . Colon polyps   . Paroxysmal atrial fibrillation (HCC)   . Atrial flutter ([place])     s/p RFA 2003  . Sick sinus syndrome (HCC)     s/p PPM (SJM)  . Varicose veins   . IBS (irritable bowel syndrome)   . Osteoporosis   . Urinary tract infection   . GERD (gastroesophageal reflux disease)   . DVT, lower extremity ([place]) 1998 and 1999     bilateral  . Pneumonia 2011  . Breast cancer ([place])     1980's - lumpectomy only    Past Surgical History  Procedure Laterality Date  . Cholecystectomy    . Pacemaker insertion  2003  . Colonoscopy w/ biopsies and polypectomy  03/2003, 05/2008, 02/20/2011    severe diverticulosis, tubulovillous adenoma polyp, internal and external hemorrhoids 2012: severe diverticulosis, 4 mm polyp, hemorrhoids  . Cataracts Bilateral   . Fractured wrist Left 2013    repair  . Total knee arthroplasty  12/20/2011    Procedure: TOTAL KNEE ARTHROPLASTY;  Surgeon: Tobi Bastos, MD;  Location: [facility];  Service: Orthopedics;  Laterality: Left;  . Pacemaker generator change  06/18/12    SJM [employer] DR RF, Dr Rayann Heman  . Cardiac catheterization  1999    Normal coronaries  . Permanent pacemaker generator change N/A 06/18/2012    Procedure: PERMANENT PACEMAKER GENERATOR CHANGE;  Surgeon: Thompson Grayer, MD;  Location:  [facility] CATH LAB;  Service: Cardiovascular;  Laterality: N/A;  . Insert / replace / remove pacemaker    . Dilation and curettage of uterus    . Eye surgery    . Knee arthroscopy with medial menisectomy Right 10/21/2014    Procedure: RIGHT KNEE ARTHROSCOPY WITH MEDIAL MENISECTOM,lateral menisectomy,synovectomy suprapatellar pouch;  Surgeon: Latanya Maudlin, MD;  Location: [facility];  Service: Orthopedics;  Laterality: Right;  . Breast lumpectomy  1980's    Right breast   . Total knee arthroplasty Right 02/10/2015    Procedure: TOTAL RIGHT KNEE ARTHROPLASTY;  Surgeon: Latanya Maudlin, MD;  Location: [facility];  Service: Orthopedics;  Laterality: Right;    There were no vitals filed for this visit.  Visit Diagnosis:  Knee stiffness, right  Right knee pain  Localized edema      Subjective Assessment - 04/09/15 1007    Subjective Reports R knee is doing good today. Reports that she went to Dr. Irving Shows regarding her ankle and he said she had arthritis throughout R foot per patient report.   Limitations Walking   How long can you walk comfortably? 10-15 minutes.   Currently in Pain? Yes   Pain Score 2    Pain Location Knee   Pain Orientation Right   Pain Descriptors / Indicators Sore  Pain Type Surgical pain   Pain Onset More than a month ago   Pain Frequency Constant   Aggravating Factors  bending it                         [facility] Adult PT Treatment/Exercise - 04/09/15 0001    Exercises   Exercises Knee/Hip   Knee/Hip Exercises: Aerobic   Nustep L4 x15  min, seat 10   Knee/Hip Exercises: Standing   Forward Lunges Right;2 sets;10 reps  14" step   Forward Step Up Right;3 sets;10 reps;Step Height: 6";Hand Hold: 1   Rocker Board 5 minutes  balance and calf stretching   Knee/Hip Exercises: Seated   Long Arc Quad Strengthening;Right;2 sets;10 reps;Weights   Long Arc Quad Weight 3 lbs.   Modalities   Modalities Vasopneumatic   Vasopneumatic   Number Minutes Vasopneumatic  15  minutes   Vasopnuematic Location  Knee   Vasopneumatic Pressure Medium   Vasopneumatic Temperature  34   Manual Therapy   Manual Therapy Passive ROM;Soft tissue mobilization                                                                                                    Patella mobs                                                                                              AROM 0-127 degrees             PT Short Term Goals - 04/07/15 1517    PT SHORT TERM GOAL #1   Title Ind with an HEP.   Time 2   Period Weeks   Status Achieved   PT SHORT TERM GOAL #2   Title Achieve full active right kne extesnion.   Time 2   Period Weeks   Status Achieved  R knee ext AROM 0 deg 04/07/2015           PT Long Term Goals - 04/07/15 1516    PT LONG TERM GOAL #1   Title Active right knee flxion= 125 degrees.   Time 4   Period Weeks   Status Achieved  R knee AROM flex 127 deg 04/07/2015   PT LONG TERM GOAL #2   Title Perform  ADL's with pain not > 2-3/10.   Time 4   Period Weeks   Status Achieved   PT LONG TERM GOAL #3   Title Perform a reciprocating stair gait wiht pain not > 2-3/10.   Time 4   Period Weeks   Status On-going   PT LONG TERM GOAL #4   Title Walk with a straight cane in clinic 500 feet with  supervision only.   Status On-going               Plan - 04/09/15 1025    Pt will benefit from skilled therapeutic intervention in order to improve on the following deficits Pain;Decreased activity tolerance;Increased edema;Decreased strength;Decreased range of motion   PT Frequency 3x / week   PT Duration 4 weeks   PT Treatment/Interventions ADLs/Self Care Home Management;Cryotherapy;Therapeutic exercise;Therapeutic activities;Neuromuscular re-education;Patient/family education;Manual techniques;Passive range of motion;Vasopneumatic Device   PT Next Visit Plan Initate next treatment with stationary bike and progress strengthening. Assess stair and gait goal  next session.   Consulted and Agree with Plan of Care Patient        Problem List Patient Active Problem List   Diagnosis Date Noted  . History of total knee arthroplasty 02/10/2015  . Chest pain 04/18/2014  . [place] anemia 12/08/2013  . A-fib ([place]) 12/08/2013  . Sick sinus syndrome ([place]) 03/22/2011  . Hyperlipidemia 02/02/2009  . Essential hypertension, benign 02/02/2009  . Paroxysmal atrial fibrillation ([place]) 05/15/2008  . COLONIC POLYPS, ADENOMATOUS, HX OF 05/15/2008    RAMSEUR,CHRIS, PTA 04/09/2015, 10:39 AM  [facility] [place], [place], [address] Phone: [phone]   Fax:  [phone]  Name: [name] MRN: [id number] Date of Birth: [birth year]

## 2015-04-14 ENCOUNTER — Ambulatory Visit: Payer: Medicare Other | Attending: Orthopedic Surgery | Admitting: Physical Therapy

## 2015-04-14 DIAGNOSIS — R2681 Unsteadiness on feet: Secondary | ICD-10-CM | POA: Diagnosis present

## 2015-04-14 DIAGNOSIS — R600 Localized edema: Secondary | ICD-10-CM | POA: Insufficient documentation

## 2015-04-14 DIAGNOSIS — M25561 Pain in right knee: Secondary | ICD-10-CM | POA: Insufficient documentation

## 2015-04-14 DIAGNOSIS — M25662 Stiffness of left knee, not elsewhere classified: Secondary | ICD-10-CM | POA: Diagnosis present

## 2015-04-14 DIAGNOSIS — M25562 Pain in left knee: Secondary | ICD-10-CM | POA: Diagnosis present

## 2015-04-14 DIAGNOSIS — M25661 Stiffness of right knee, not elsewhere classified: Secondary | ICD-10-CM | POA: Diagnosis present

## 2015-04-14 NOTE — Therapy (Signed)
[facility] [place], [place], [address] Phone: [phone]   Fax:  [phone]  Physical Therapy Treatment  Patient Details  Name: [name] MRN: [id number] Date of Birth: 1928/08/22 Referring Provider: Dr. Gladstone Lighter  Encounter Date: 04/14/2015      PT End of Session - 04/14/15 0952    Visit Number 9   Number of Visits 12   Date for PT Re-Evaluation 04/21/15   PT Start Time 0950   PT Stop Time 1034   PT Time Calculation (min) 44 min   Activity Tolerance Patient tolerated treatment well   Behavior During Therapy [facility] for tasks assessed/performed      Past Medical History  Diagnosis Date  . Arthritis   . Glaucoma   . Diverticulosis   . Mixed hyperlipidemia   . Essential hypertension, benign   . Colon polyps   . Paroxysmal atrial fibrillation (HCC)   . Atrial flutter ([place])     s/p RFA 2003  . Sick sinus syndrome (HCC)     s/p PPM (SJM)  . Varicose veins   . IBS (irritable bowel syndrome)   . Osteoporosis   . Urinary tract infection   . GERD (gastroesophageal reflux disease)   . DVT, lower extremity ([place]) 1998 and 1999     bilateral  . Pneumonia 2011  . Breast cancer ([place])     1980's - lumpectomy only    Past Surgical History  Procedure Laterality Date  . Cholecystectomy    . Pacemaker insertion  2003  . Colonoscopy w/ biopsies and polypectomy  03/2003, 05/2008, 02/20/2011    severe diverticulosis, tubulovillous adenoma polyp, internal and external hemorrhoids 2012: severe diverticulosis, 4 mm polyp, hemorrhoids  . Cataracts Bilateral   . Fractured wrist Left 2013    repair  . Total knee arthroplasty  12/20/2011    Procedure: TOTAL KNEE ARTHROPLASTY;  Surgeon: Tobi Bastos, MD;  Location: [facility];  Service: Orthopedics;  Laterality: Left;  . Pacemaker generator change  06/18/12    SJM Acc[employer] DR RF, Dr Rayann Heman  . Cardiac catheterization  1999    Normal coronaries  . Permanent pacemaker  generator change N/A 06/18/2012    Procedure: PERMANENT PACEMAKER GENERATOR CHANGE;  Surgeon: Thompson Grayer, MD;  Location: [facility] CATH LAB;  Service: Cardiovascular;  Laterality: N/A;  . Insert / replace / remove pacemaker    . Dilation and curettage of uterus    . Eye surgery    . Knee arthroscopy with medial menisectomy Right 10/21/2014    Procedure: RIGHT KNEE ARTHROSCOPY WITH MEDIAL MENISECTOM,lateral menisectomy,synovectomy suprapatellar pouch;  Surgeon: Latanya Maudlin, MD;  Location: [facility];  Service: Orthopedics;  Laterality: Right;  . Breast lumpectomy  1980's    Right breast   . Total knee arthroplasty Right 02/10/2015    Procedure: TOTAL RIGHT KNEE ARTHROPLASTY;  Surgeon: Latanya Maudlin, MD;  [facility];  Service: Orthopedics;  Laterality: Right;    There were no vitals filed for this visit.  Visit Diagnosis:  Knee stiffness, right  Unsteadiness      Subjective Assessment - 04/14/15 0952    Subjective Patient states her knee is doing good. Her arthritis is not!   Currently in Pain? No/denies                         [facility] Adult PT Treatment/Exercise - 04/14/15 0001    Ambulation/Gait   Ambulation Distance (Feet) 600 Feet   Assistive  device Straight cane   Gait Pattern Step-through pattern   Ambulation Surface Level   Stairs Yes   Stair Management Technique One rail Right;One rail Left;Forwards;Step to pattern  able to perform ascending reciprocally   Number of Stairs 8   Height of Stairs 8   Gait Comments 3 LOB   High Level Balance   High Level Balance Activities Side stepping;Marching forwards;Head turns   High Level Balance Comments all on compliant surfaces: Head turns were with toes on foam beam; also with arm raises    Knee/Hip Exercises: Standing   Hip Flexion Stengthening;Both;2 sets;10 reps  toe taps for balance with head neutral                  PT Short Term Goals - 04/07/15 1517    PT SHORT TERM GOAL #1   Title Ind with an  HEP.   Time 2   Period Weeks   Status Achieved   PT SHORT TERM GOAL #2   Title Achieve full active right kne extesnion.   Time 2   Period Weeks   Status Achieved  R knee ext AROM 0 deg 04/07/2015           PT Long Term Goals - 04/14/15 1218    PT LONG TERM GOAL #1   Title Active right knee flxion= 125 degrees.   Time 4   Period Weeks   Status Achieved   PT LONG TERM GOAL #2   Title Perform  ADL's with pain not > 2-3/10.   Time 4   Period Weeks   Status Achieved   PT LONG TERM GOAL #3   Title Perform a reciprocating stair gait wiht pain not > 2-3/10.   Baseline able to ascend reciprocally   Time 4   Period Weeks   Status On-going   PT LONG TERM GOAL #4   Title Walk with a straight cane in clinic 500 feet with supervision only.   Time 4   Period Weeks   Status Achieved               Plan - 04/14/15 1219    Clinical Impression Statement Patient has met her LTG for gait. She has partially met her stair goal. She experienced 3 LOB during gait but was able to stabilizer herself I. She did well with balance activities and improved with multiple reps working on COG and keeping her head level. Patient will benefit from balance and stair training for remaining 3 visits.   Pt will benefit from skilled therapeutic intervention in order to improve on the following deficits Pain;Decreased activity tolerance;Increased edema;Decreased strength;Decreased range of motion   Rehab Potential Excellent   PT Frequency 3x / week   PT Duration 4 weeks   PT Treatment/Interventions ADLs/Self Care Home Management;Cryotherapy;Therapeutic exercise;Therapeutic activities;Neuromuscular re-education;Patient/family education;Manual techniques;Passive range of motion;Vasopneumatic Device   PT Next Visit Plan FOTO and MD note. (10th visit) Work on balance, COG, and reciprocal gait on stairs for last 3 visits.   Consulted and Agree with Plan of Care Patient        Problem List Patient  Active Problem List   Diagnosis Date Noted  . History of total knee arthroplasty 02/10/2015  . Chest pain 04/18/2014  . [place]ison anemia 12/08/2013  . A-fib ([place]) 12/08/2013  . Sick sinus syndrome ([place]) 03/22/2011  . Hyperlipidemia 02/02/2009  . Essential hypertension, benign 02/02/2009  . Paroxysmal atrial fibrillation ([employer]) 05/15/2008  . COLONIC POLYPS, ADENOMATOUS, HX OF 05/15/2008  Madelyn Flavors PT  04/14/2015, 12:28 PM  [facility] [place], [place], [address] Phone: [phone]   Fax:  [phone]  Name: [name] MRN: [id number] Date of Birth: 01-16-1929

## 2015-04-15 ENCOUNTER — Ambulatory Visit (INDEPENDENT_AMBULATORY_CARE_PROVIDER_SITE_OTHER): Payer: Medicare Other | Admitting: Cardiology

## 2015-04-15 ENCOUNTER — Encounter: Payer: Self-pay | Admitting: Cardiology

## 2015-04-15 VITALS — BP 130/90 | HR 59 | Ht 66.0 in | Wt 176.0 lb

## 2015-04-15 DIAGNOSIS — I10 Essential (primary) hypertension: Secondary | ICD-10-CM | POA: Diagnosis not present

## 2015-04-15 DIAGNOSIS — I481 Persistent atrial fibrillation: Secondary | ICD-10-CM | POA: Diagnosis not present

## 2015-04-15 DIAGNOSIS — I4819 Other persistent atrial fibrillation: Secondary | ICD-10-CM

## 2015-04-15 MED ORDER — AMIODARONE HCL 200 MG PO TABS: 100.0000 mg | ORAL_TABLET | Freq: Every day | ORAL | Status: DC

## 2015-04-15 NOTE — Progress Notes (Signed)
Cardiology Office Note  Date: 04/15/2015   ID: KRISTE BROMAN, DOB 29-Apr-1929, MRN [id number]  PCP: Sherrie Mustache, MD  Primary Cardiologist: Rozann Lesches, MD   Chief Complaint  Patient presents with  . Atrial arrhythmias    History of Present Illness: [name] is an 81 y.o. female last seen in July. Record review finds interval total right knee arthroplasty with Dr. Gladstone Lighter in late August with no obvious perioperative cardiac complications. She continues to do rehabilitation. She complains of arthritic pains in her legs, states that her right knee has gotten better.  She continues to follow with Dr. Rayann Heman for device management. Interrogation from October indicated persistent atrial fibrillation since May without high ventricular rate episodes. She does not endorse any sense of palpitations recently. We talked about the possibility of eventually taking her off amiodarone, and not pursuing a cardioversion since she has not been overly symptomatic. She has had no problems with Eliquis.   Past Medical History  Diagnosis Date  . Arthritis   . Glaucoma   . Diverticulosis   . Mixed hyperlipidemia   . Essential hypertension, benign   . Colon polyps   . Paroxysmal atrial fibrillation (HCC)   . Atrial flutter ([place])     s/p RFA 2003  . Sick sinus syndrome (HCC)     s/p PPM (SJM)  . Varicose veins   . IBS (irritable bowel syndrome)   . Osteoporosis   . Urinary tract infection   . GERD (gastroesophageal reflux disease)   . DVT, lower extremity ([place]) 1998 and 1999    Bilateral  . Pneumonia 2011  . Breast cancer ([place])     1980's - lumpectomy only    Current Outpatient Prescriptions  Medication Sig Dispense Refill  . amiodarone (PACERONE) 200 MG tablet Take 0.5 tablets (100 mg total) by mouth daily.    Marland Kitchen apixaban (ELIQUIS) 5 MG TABS tablet Take 1 tablet (5 mg total) by mouth 2 (two) times daily. 60 tablet 6  . bimatoprost (LUMIGAN) 0.01 % SOLN Place 1  drop into both eyes at bedtime.    . cyanocobalamin (,VITAMIN B-12,) 1000 MCG/ML injection Inject 1,000 mcg into the muscle every 30 (thirty) days.      . Emollient (AQUA GLYCOLIC HAND/BODY EX) Apply 1 application topically every morning.    . latanoprost (XALATAN) 0.005 % ophthalmic solution Place 1 drop into both eyes at bedtime.    . Multiple Vitamin (MULTIVITAMIN) tablet Take 1 tablet by mouth every morning.     . nadolol (CORGARD) 40 MG tablet TAKE ONE TABLET BY MOUTH DAILY WITH BREAKFAST 30 tablet 6  . nitroGLYCERIN (NITROSTAT) 0.4 MG SL tablet Place 1 tablet (0.4 mg total) under the tongue every 5 (five) minutes as needed for chest pain. 25 tablet 3  . tiZANidine (ZANAFLEX) 4 MG tablet Take 1 tablet (4 mg total) by mouth every 8 (eight) hours as needed. 60 tablet 0   No current facility-administered medications for this visit.   Facility-Administered Medications Ordered in Other Visits  Medication Dose Route Frequency Provider Last Rate Last Dose  . bupivacaine liposome (EXPAREL) 1.3 % injection 266 mg  20 mL Infiltration Once [employer], PA-C        Allergies:  Iodinated diagnostic agents and Naproxen   Social History: The patient  reports that she has never smoked. She has never used smokeless tobacco. She reports that she does not drink alcohol or use illicit drugs.   ROS:  Please see  the history of present illness. Otherwise, complete review of systems is positive for arthritic pains and fatigue.  All other systems are reviewed and negative.   Physical Exam: VS:  BP 130/90 mmHg  Pulse 59  Ht 5\' 6"  (1.676 m)  Wt 176 lb (79.833 kg)  BMI 28.42 kg/m2  SpO2 99%, BMI Body mass index is 28.42 kg/(m^2).  Wt Readings from Last 3 Encounters:  04/15/15 176 lb (79.833 kg)  02/10/15 178 lb 9.2 oz (81 kg)  02/03/15 177 lb 12.8 oz (80.65 kg)     Comfortable at rest.  HEENT: Conjunctiva and lids normal, oropharynx clear.  Neck: Supple, no carotid bruits, no elevated jugular  venous pressure.  Lungs: Clear to auscultation, nonlabored.  Cardiac: Irregular rhythm, no significant systolic murmur or S3..  Abdomen: Soft, bowel sounds present.  Extremities: No pitting. Varicose veins noted.   ECG: Tracing from 02/10/2015 showed ventricular paced rhythm..   Recent Labwork: 04/18/2014: Pro B Natriuretic peptide (BNP) 971.7* 02/03/2015: ALT 13*; AST 15 02/12/2015: BUN 13; Creatinine, Ser 0.67; Hemoglobin 9.2*; Platelets 204; Potassium 4.5; Sodium 136   Assessment and Plan:  1. Persistent atrial fibrillation. She is not overly symptomatically at this time. Plan will be to cut amiodarone back to 100 mg daily, continue Eliquis. We may eventually just take her off amiodarone and replace with rate control agent if needed.  2. Essential hypertension, no changes made to current regimen.  Current medicines were reviewed with the patient today.  Disposition: FU with me in 4 months.   Signed, Satira Sark, MD, [facility] 04/15/2015 10:40 AM    [facility] [place], [place], [place] [address] Phone: [phone]; Fax: [phone]

## 2015-04-15 NOTE — Patient Instructions (Signed)
   Decrease Amiodarone to 100mg  DAILY  Continue all other medications.   Follow up in  4 months.

## 2015-04-20 ENCOUNTER — Ambulatory Visit: Payer: Medicare Other | Admitting: Physical Therapy

## 2015-04-20 DIAGNOSIS — R2681 Unsteadiness on feet: Secondary | ICD-10-CM

## 2015-04-20 DIAGNOSIS — M25661 Stiffness of right knee, not elsewhere classified: Secondary | ICD-10-CM | POA: Diagnosis not present

## 2015-04-20 NOTE — Therapy (Signed)
[facility] [place], [place], [address] Phone: [phone]   Fax:  [phone]  Physical Therapy Treatment  Patient Details  Name: [name] MRN: [id number] Date of Birth: 1928/08/14 Referring Provider: Dr. Gladstone Lighter  Encounter Date: 04/20/2015      PT End of Session - 04/20/15 0953    Visit Number 10   Date for PT Re-Evaluation 04/21/15   PT Start Time 0953   PT Stop Time 1030   PT Time Calculation (min) 37 min   Activity Tolerance Patient tolerated treatment well   Behavior During Therapy [facility] for tasks assessed/performed      Past Medical History  Diagnosis Date  . Arthritis   . Glaucoma   . Diverticulosis   . Mixed hyperlipidemia   . Essential hypertension, benign   . Colon polyps   . Paroxysmal atrial fibrillation (HCC)   . Atrial flutter ([place])     s/p RFA 2003  . Sick sinus syndrome (HCC)     s/p PPM (SJM)  . Varicose veins   . IBS (irritable bowel syndrome)   . Osteoporosis   . Urinary tract infection   . GERD (gastroesophageal reflux disease)   . DVT, lower extremity ([place]) 1998 and 1999    Bilateral  . Pneumonia 2011  . Breast cancer ([place])     1980's - lumpectomy only    Past Surgical History  Procedure Laterality Date  . Cholecystectomy    . Pacemaker insertion  2003  . Colonoscopy w/ biopsies and polypectomy  03/2003, 05/2008, 02/20/2011    severe diverticulosis, tubulovillous adenoma polyp, internal and external hemorrhoids 2012: severe diverticulosis, 4 mm polyp, hemorrhoids  . Cataracts Bilateral   . Fractured wrist Left 2013    repair  . Total knee arthroplasty  12/20/2011    Procedure: TOTAL KNEE ARTHROPLASTY;  Surgeon: Tobi Bastos, MD;  Location: [facility];  Service: Orthopedics;  Laterality: Left;  . Pacemaker generator change  06/18/12    SJM [employer] DR RF, Dr Rayann Heman  . Cardiac catheterization  1999    Normal coronaries  . Permanent pacemaker generator change N/A 06/18/2012     Procedure: PERMANENT PACEMAKER GENERATOR CHANGE;  Surgeon: Thompson Grayer, MD;  Location: [facility] CATH LAB;  Service: Cardiovascular;  Laterality: N/A;  . Insert / replace / remove pacemaker    . Dilation and curettage of uterus    . Eye surgery    . Knee arthroscopy with medial menisectomy Right 10/21/2014    Procedure: RIGHT KNEE ARTHROSCOPY WITH MEDIAL MENISECTOM,lateral menisectomy,synovectomy suprapatellar pouch;  Surgeon: Latanya Maudlin, MD;  Location: [facility];  Service: Orthopedics;  Laterality: Right;  . Breast lumpectomy  1980's    Right breast   . Total knee arthroplasty Right 02/10/2015    Procedure: TOTAL RIGHT KNEE ARTHROPLASTY;  Surgeon: Latanya Maudlin, MD;  Location: [facility];  Service: Orthopedics;  Laterality: Right;    There were no vitals filed for this visit.  Visit Diagnosis:  Unsteadiness      Subjective Assessment - 04/20/15 0957    Subjective I'm feeling pretty good.   Currently in Pain? No/denies                         [facility] Adult PT Treatment/Exercise - 04/20/15 0001    Knee/Hip Exercises: Aerobic   Nustep L5 x 6 min   Knee/Hip Exercises: Standing   Heel Raises 20 reps   Hip Flexion Stengthening;Both;2 sets;10 reps  toe taps for balance with head neutral   Forward Step Up Right;10 reps;Step Height: 6";1 set;Step Height: 8";Hand Hold: 2   Step Down --   Functional Squat 20 reps  with left foot on 2 inch step   Rocker Board 3 minutes  balance with head level                  PT Short Term Goals - 04/07/15 1517    PT SHORT TERM GOAL #1   Title Ind with an HEP.   Time 2   Period Weeks   Status Achieved   PT SHORT TERM GOAL #2   Title Achieve full active right kne extesnion.   Time 2   Period Weeks   Status Achieved  R knee ext AROM 0 deg 04/07/2015           PT Long Term Goals - 05-19-15 1620    PT LONG TERM GOAL #1   Title Active right knee flxion= 125 degrees.   Time 4   Period Weeks   Status Achieved   PT LONG  TERM GOAL #2   Title Perform  ADL's with pain not > 2-3/10.   Time 4   Period Weeks   Status Achieved   PT LONG TERM GOAL #3   Title Perform a reciprocating stair gait wiht pain not > 2-3/10.   Baseline able to ascend reciprocally   Time 4   Period Weeks   Status On-going   PT LONG TERM GOAL #4   Title Walk with a straight cane in clinic 500 feet with supervision only.   Time 4   Period Weeks   Status Achieved               Plan - May 19, 2015 1614    Clinical Impression Statement Patient did well with balance activities today. She improves with multiple reps of balance activities. From previous assessment: patient has met her LTG for gait. She experienced 3 LOB with SPC, but was able to stabilize herself I. She is able to ascend stairs reciprocally but uses a step to gait to descend due to eccentric quad weakness. ROM and strength goals have been met. Plan to work on balance for last two visits then D/C.   Pt will benefit from skilled therapeutic intervention in order to improve on the following deficits Pain;Decreased activity tolerance;Increased edema;Decreased strength;Decreased range of motion   Rehab Potential Excellent   PT Frequency 3x / week   PT Duration 4 weeks   PT Treatment/Interventions ADLs/Self Care Home Management;Cryotherapy;Therapeutic exercise;Therapeutic activities;Neuromuscular re-education;Patient/family education;Manual techniques;Passive range of motion;Vasopneumatic Device   PT Next Visit Plan Work on balance, COG, and reciprocal gait on stairs for last 2 visits.  Then DC to HEP.   Consulted and Agree with Plan of Care Patient          G-Codes - 05/19/15 1621    Functional Assessment Tool Used FOTO. 59% limited vs. 62% at eval   Functional Limitation Mobility: Walking and moving around   Mobility: Walking and Moving Around Current Status [phone]) At least 40 percent but less than 60 percent impaired, limited or restricted   Mobility: Walking and Moving  Around Goal Status [phone]) At least 40 percent but less than 60 percent impaired, limited or restricted      Problem List Patient Active Problem List   Diagnosis Date Noted  . History of total knee arthroplasty 02/10/2015  . Chest pain 04/18/2014  . [place]ison anemia 12/08/2013  .  A-fib ([place]) 12/08/2013  . Sick sinus syndrome ([place]) 03/22/2011  . Hyperlipidemia 02/02/2009  . Essential hypertension, benign 02/02/2009  . Paroxysmal atrial fibrillation ([place]) 05/15/2008  . COLONIC POLYPS, ADENOMATOUS, HX OF 05/15/2008    Madelyn Flavors PT  04/20/2015, 4:23 PM  [facility] [address] [place], [place], [address] Phone: [phone]   Fax:  [phone]  Name: GLYN ZENDEJAS MRN: [id number] Date of Birth: 1929/01/27

## 2015-04-27 ENCOUNTER — Encounter: Payer: Self-pay | Admitting: Physical Therapy

## 2015-04-28 ENCOUNTER — Other Ambulatory Visit: Payer: Self-pay | Admitting: *Deleted

## 2015-04-28 MED ORDER — NITROGLYCERIN 0.4 MG SL SUBL: 0.4000 mg | SUBLINGUAL_TABLET | SUBLINGUAL | Status: DC | PRN

## 2015-05-04 ENCOUNTER — Ambulatory Visit: Payer: Medicare Other | Admitting: Physical Therapy

## 2015-05-04 DIAGNOSIS — R2681 Unsteadiness on feet: Secondary | ICD-10-CM

## 2015-05-04 DIAGNOSIS — M25661 Stiffness of right knee, not elsewhere classified: Secondary | ICD-10-CM | POA: Diagnosis not present

## 2015-05-04 NOTE — Therapy (Signed)
[facility] [place], [place], [address] Phone: [phone]   Fax:  [phone]  Physical Therapy Treatment  Patient Details  Name: [name] MRN: [id number] Date of Birth: Feb 14, 1929 Referring Provider: Dr. Gladstone Lighter  Encounter Date: 05/04/2015      PT End of Session - 05/04/15 0954    Visit Number 11   Number of Visits 12   Date for PT Re-Evaluation 05/10/15   PT Start Time 0954   PT Stop Time 1030   PT Time Calculation (min) 36 min      Past Medical History  Diagnosis Date  . Arthritis   . Glaucoma   . Diverticulosis   . Mixed hyperlipidemia   . Essential hypertension, benign   . Colon polyps   . Paroxysmal atrial fibrillation (HCC)   . Atrial flutter ([place])     s/p RFA 2003  . Sick sinus syndrome (HCC)     s/p PPM (SJM)  . Varicose veins   . IBS (irritable bowel syndrome)   . Osteoporosis   . Urinary tract infection   . GERD (gastroesophageal reflux disease)   . DVT, lower extremity ([place]) 1998 and 1999    Bilateral  . Pneumonia 2011  . Breast cancer ([place])     1980's - lumpectomy only    Past Surgical History  Procedure Laterality Date  . Cholecystectomy    . Pacemaker insertion  2003  . Colonoscopy w/ biopsies and polypectomy  03/2003, 05/2008, 02/20/2011    severe diverticulosis, tubulovillous adenoma polyp, internal and external hemorrhoids 2012: severe diverticulosis, 4 mm polyp, hemorrhoids  . Cataracts Bilateral   . Fractured wrist Left 2013    repair  . Total knee arthroplasty  12/20/2011    Procedure: TOTAL KNEE ARTHROPLASTY;  Surgeon: Tobi Bastos, MD;  [facility];  Service: Orthopedics;  Laterality: Left;  . Pacemaker generator change  06/18/12    SJM [employer] DR RF, Dr Rayann Heman  . Cardiac catheterization  1999    Normal coronaries  . Permanent pacemaker generator change N/A 06/18/2012    Procedure: PERMANENT PACEMAKER GENERATOR CHANGE;  Surgeon: Thompson Grayer, MD;  Location:  [facility] CATH LAB;  Service: Cardiovascular;  Laterality: N/A;  . Insert / replace / remove pacemaker    . Dilation and curettage of uterus    . Eye surgery    . Knee arthroscopy with medial menisectomy Right 10/21/2014    Procedure: RIGHT KNEE ARTHROSCOPY WITH MEDIAL MENISECTOM,lateral menisectomy,synovectomy suprapatellar pouch;  Surgeon: Latanya Maudlin, MD;  [facility];  Service: Orthopedics;  Laterality: Right;  . Breast lumpectomy  1980's    Right breast   . Total knee arthroplasty Right 02/10/2015    Procedure: TOTAL RIGHT KNEE ARTHROPLASTY;  Surgeon: Latanya Maudlin, MD;  [facility];  Service: Orthopedics;  Laterality: Right;    There were no vitals filed for this visit.  Visit Diagnosis:  Unsteadiness      Subjective Assessment - 05/04/15 0956    Subjective Patient states her balance is about the same. My knee doesn't bother me anymore. It gets stiff. Patient states she can climb stairs with step to gait pattern due to fear of falling if using a reciprocal gait.    How long can you walk comfortably? one hour   Currently in Pain? No/denies                         [facility] Adult PT Treatment/Exercise -  05/04/15 0001    Knee/Hip Exercises: Aerobic   Nustep L5 x 10 min   Knee/Hip Exercises: Standing   Heel Raises 20 reps   Hip Flexion Stengthening;Both;2 sets;10 reps  toe taps for balance with head neutral   Rocker Board 3 minutes  on bosu round side down; arm lifts             Balance Exercises - 05/04/15 1221    Balance Exercises: Standing   Standing Eyes Opened Narrow base of support (BOS);Head turns;Foam/compliant surface;Time   Standing Eyes Closed Narrow base of support (BOS);Foam/compliant surface   Balance Beam toes on beam with head turns             PT Short Term Goals - 04/07/15 1517    PT SHORT TERM GOAL #1   Title Ind with an HEP.   Time 2   Period Weeks   Status Achieved   PT SHORT TERM GOAL #2   Title Achieve full active  right kne extesnion.   Time 2   Period Weeks   Status Achieved  R knee ext AROM 0 deg 04/07/2015           PT Long Term Goals - 05/04/15 1033    PT LONG TERM GOAL #1   Title Active right knee flxion= 125 degrees.   Time 4   Period Weeks   Status Achieved   PT LONG TERM GOAL #2   Title Perform  ADL's with pain not > 2-3/10.   Time 4   Status Achieved   PT LONG TERM GOAL #3   Title Perform a reciprocating stair gait wiht pain not > 2-3/10.   Baseline able to ascend reciprocally   Time 4   Period Weeks   Status Partially Met   PT LONG TERM GOAL #4   Title Walk with a straight cane in clinic 500 feet with supervision only.   Time 4   Period Weeks   Status Achieved               Plan - 05/04/15 1034    Clinical Impression Statement Patient did very well with balance activites today. She demonstrated improved balance with challenges to COG. She continues to experience LOB mainly with head turns to the R.    Pt will benefit from skilled therapeutic intervention in order to improve on the following deficits Pain;Decreased activity tolerance;Increased edema;Decreased strength;Decreased range of motion   Rehab Potential Excellent   PT Frequency 3x / week   PT Duration 4 weeks   PT Treatment/Interventions ADLs/Self Care Home Management;Cryotherapy;Therapeutic exercise;Therapeutic activities;Neuromuscular re-education;Patient/family education;Manual techniques;Passive range of motion;Vasopneumatic Device   PT Next Visit Plan Finalize HEP for balance, continue work on balance, COG, and then DC to HEP.   Consulted and Agree with Plan of Care Patient        Problem List Patient Active Problem List   Diagnosis Date Noted  . History of total knee arthroplasty 02/10/2015  . Chest pain 04/18/2014  . Addison anemia 12/08/2013  . A-fib ([place]) 12/08/2013  . Sick sinus syndrome ([place]) 03/22/2011  . Hyperlipidemia 02/02/2009  . Essential hypertension, benign 02/02/2009  .  Paroxysmal atrial fibrillation ([place]) 05/15/2008  . COLONIC POLYPS, ADENOMATOUS, HX OF 05/15/2008    Madelyn Flavors PT  05/04/2015, 12:24 PM  [facility] [address] [place], [place], [address] Phone: [phone]   Fax:  [phone]  Name: ELLANOR FEUERSTEIN MRN: [id number] Date of Birth: 1928/08/17

## 2015-05-10 ENCOUNTER — Other Ambulatory Visit: Payer: Self-pay | Admitting: *Deleted

## 2015-05-10 MED ORDER — APIXABAN 5 MG PO TABS: 5.0000 mg | ORAL_TABLET | Freq: Two times a day (BID) | ORAL | Status: DC

## 2015-05-11 ENCOUNTER — Encounter: Payer: Self-pay | Admitting: Physical Therapy

## 2015-05-11 ENCOUNTER — Ambulatory Visit: Payer: Medicare Other | Admitting: Physical Therapy

## 2015-05-11 DIAGNOSIS — R600 Localized edema: Secondary | ICD-10-CM

## 2015-05-11 DIAGNOSIS — M25661 Stiffness of right knee, not elsewhere classified: Secondary | ICD-10-CM

## 2015-05-11 DIAGNOSIS — M25562 Pain in left knee: Secondary | ICD-10-CM

## 2015-05-11 DIAGNOSIS — R2681 Unsteadiness on feet: Secondary | ICD-10-CM

## 2015-05-11 DIAGNOSIS — M25561 Pain in right knee: Secondary | ICD-10-CM

## 2015-05-11 DIAGNOSIS — M25662 Stiffness of left knee, not elsewhere classified: Secondary | ICD-10-CM

## 2015-05-11 NOTE — Therapy (Signed)
[facility] [place], [place], [address] Phone: [phone]   Fax:  [phone]  Physical Therapy Treatment  Patient Details  Name: [name] MRN: [id number] Date of Birth: 05-21-1929 Referring Provider: Dr. Gladstone Lighter  Encounter Date: 05/11/2015      PT End of Session - 05/11/15 0957    Visit Number 12   Number of Visits 12   Date for PT Re-Evaluation 05/10/15   PT Start Time 0955   PT Stop Time 1033   PT Time Calculation (min) 38 min   Activity Tolerance Patient tolerated treatment well   Behavior During Therapy [facility] for tasks assessed/performed      Past Medical History  Diagnosis Date  . Arthritis   . Glaucoma   . Diverticulosis   . Mixed hyperlipidemia   . Essential hypertension, benign   . Colon polyps   . Paroxysmal atrial fibrillation (HCC)   . Atrial flutter ([place])     s/p RFA 2003  . Sick sinus syndrome (HCC)     s/p PPM (SJM)  . Varicose veins   . IBS (irritable bowel syndrome)   . Osteoporosis   . Urinary tract infection   . GERD (gastroesophageal reflux disease)   . DVT, lower extremity ([place]) 1998 and 1999    Bilateral  . Pneumonia 2011  . Breast cancer ([place])     1980's - lumpectomy only    Past Surgical History  Procedure Laterality Date  . Cholecystectomy    . Pacemaker insertion  2003  . Colonoscopy w/ biopsies and polypectomy  03/2003, 05/2008, 02/20/2011    severe diverticulosis, tubulovillous adenoma polyp, internal and external hemorrhoids 2012: severe diverticulosis, 4 mm polyp, hemorrhoids  . Cataracts Bilateral   . Fractured wrist Left 2013    repair  . Total knee arthroplasty  12/20/2011    Procedure: TOTAL KNEE ARTHROPLASTY;  Surgeon: Tobi Bastos, MD;  [facility];  Service: Orthopedics;  Laterality: Left;  . Pacemaker generator change  06/18/12    SJM [employer] DR RF, Dr Rayann Heman  . Cardiac catheterization  1999    Normal coronaries  . Permanent pacemaker  generator change N/A 06/18/2012    Procedure: PERMANENT PACEMAKER GENERATOR CHANGE;  Surgeon: Thompson Grayer, MD;  Location: [facility] CATH LAB;  Service: Cardiovascular;  Laterality: N/A;  . Insert / replace / remove pacemaker    . Dilation and curettage of uterus    . Eye surgery    . Knee arthroscopy with medial menisectomy Right 10/21/2014    Procedure: RIGHT KNEE ARTHROSCOPY WITH MEDIAL MENISECTOM,lateral menisectomy,synovectomy suprapatellar pouch;  Surgeon: Latanya Maudlin, MD;  [facility];  Service: Orthopedics;  Laterality: Right;  . Breast lumpectomy  1980's    Right breast   . Total knee arthroplasty Right 02/10/2015    Procedure: TOTAL RIGHT KNEE ARTHROPLASTY;  Surgeon: Latanya Maudlin, MD;  [facility];  Service: Orthopedics;  Laterality: Right;    There were no vitals filed for this visit.  Visit Diagnosis:  Unsteadiness  Knee stiffness, right  Right knee pain  Localized edema  Left knee pain  Knee stiffness, left      Subjective Assessment - 05/11/15 0957    Subjective Reports that her knee doesn't bother her or hurt much anymore.   Limitations Walking   How long can you walk comfortably? one hour   Currently in Pain? No/denies            [facility] PT Assessment - 05/11/15 0001  Assessment   Medical Diagnosis Right total knee replacement.   Onset Date/Surgical Date 02/10/15                     [facility] Adult PT Treatment/Exercise - 05/11/15 0001    Ambulation/Gait   Stairs Yes   Stairs Assistance 6: Modified independent (Device/Increase time)   Stair Management Technique One rail Right;Alternating pattern;Step to pattern;Forwards;With cane   Number of Stairs 12   Height of Stairs 8   Knee/Hip Exercises: Aerobic   Nustep L5 x12 min             Balance Exercises - 05/11/15 1046    Balance Exercises: Standing   Standing Eyes Opened Narrow base of support (BOS);Head turns;Foam/compliant surface  x3 min   Tandem Stance Eyes  open;Foam/compliant surface;Intermittent upper extremity support  x3 min   Rockerboard Anterior/posterior;EO;Other time (comment);Intermittent UE support  x3 min   Other Standing Exercises DLS on Bosu x4 min with intermittant UE support; toe taps B 8" step x30 rep             PT Short Term Goals - 04/07/15 1517    PT SHORT TERM GOAL #1   Title Ind with an HEP.   Time 2   Period Weeks   Status Achieved   PT SHORT TERM GOAL #2   Title Achieve full active right kne extesnion.   Time 2   Period Weeks   Status Achieved  R knee ext AROM 0 deg 04/07/2015           PT Long Term Goals - 05/11/15 1044    PT LONG TERM GOAL #1   Title Active right knee flxion= 125 degrees.   Time 4   Period Weeks   Status Achieved   PT LONG TERM GOAL #2   Title Perform  ADL's with pain not > 2-3/10.   Time 4   Status Achieved   PT LONG TERM GOAL #3   Title Perform a reciprocating stair gait wiht pain not > 2-3/10.   Baseline able to ascend reciprocally   Time 4   Period Weeks   Status Achieved   PT LONG TERM GOAL #4   Title Walk with a straight cane in clinic 500 feet with supervision only.   Time 4   Period Weeks   Status Achieved               Plan - 05/11/15 1038    Clinical Impression Statement Patient tolerated today's treatment fairly well although patient noted that she did better with balance exercises in previous treatment. Achieved all goals set at evaluation with the acheivement of reciprical stair ambulation with handrail and SPC although patient may be more confident with nonreciprical stair ambulation.During balance exercises patient required intermittant UE support to regain balance in parallel bars. Patient had tendency to lose balance posteriorly. Had increased difficulty with balance when weightbearing more on RLE and with head turns to the R side. Accepted balance HEP provided by MPT and verbalized understanding with and was aware of need for someone to be present  with her for safety. Only complaint of pain was verbalized during toe taps to 8" step in L hip. Gave no numerical pain rating for L hip pain following today's treatment.    Pt will benefit from skilled therapeutic intervention in order to improve on the following deficits Pain;Decreased activity tolerance;Increased edema;Decreased strength;Decreased range of motion   Rehab Potential Excellent   PT Frequency  3x / week   PT Duration 4 weeks   PT Treatment/Interventions ADLs/Self Care Home Management;Cryotherapy;Therapeutic exercise;Therapeutic activities;Neuromuscular re-education;Patient/family education;Manual techniques;Passive range of motion;Vasopneumatic Device   PT Next Visit Plan Commuicate need for discharge summary to MPT.   Consulted and Agree with Plan of Care Patient        Problem List Patient Active Problem List   Diagnosis Date Noted  . History of total knee arthroplasty 02/10/2015  . Chest pain 04/18/2014  . Addison anemia 12/08/2013  . A-fib ([place]) 12/08/2013  . Sick sinus syndrome ([place]) 03/22/2011  . Hyperlipidemia 02/02/2009  . Essential hypertension, benign 02/02/2009  . Paroxysmal atrial fibrillation ([place]) 05/15/2008  . COLONIC POLYPS, ADENOMATOUS, HX OF 05/15/2008    Ahmed Prima, PTA 05/11/2015 11:00 AM   [facility] [place], [place], [address] Phone: [phone]   Fax:  [phone]  Name: [name] MRN: [id number] Date of Birth: Apr 13, 1929

## 2015-05-11 NOTE — Therapy (Signed)
[facility] [place], [place], [address] Phone: [phone]   Fax:  [phone]  Physical Therapy Treatment  Patient Details  Name: [name] MRN: [id number] Date of Birth: 05-21-1929 Referring Provider: Dr. Gladstone Lighter  Encounter Date: 05/11/2015      PT End of Session - 05/11/15 0957    Visit Number 12   Number of Visits 12   Date for PT Re-Evaluation 05/10/15   PT Start Time 0955   PT Stop Time 1033   PT Time Calculation (min) 38 min   Activity Tolerance Patient tolerated treatment well   Behavior During Therapy [facility] for tasks assessed/performed      Past Medical History  Diagnosis Date  . Arthritis   . Glaucoma   . Diverticulosis   . Mixed hyperlipidemia   . Essential hypertension, benign   . Colon polyps   . Paroxysmal atrial fibrillation (HCC)   . Atrial flutter ([place])     s/p RFA 2003  . Sick sinus syndrome (HCC)     s/p PPM ([facility])  . Varicose veins   . IBS (irritable bowel syndrome)   . Osteoporosis   . Urinary tract infection   . GERD (gastroesophageal reflux disease)   . DVT, lower extremity ([place]) 1998 and 1999    Bilateral  . Pneumonia 2011  . Breast cancer ([place])     1980's - lumpectomy only    Past Surgical History  Procedure Laterality Date  . Cholecystectomy    . Pacemaker insertion  2003  . Colonoscopy w/ biopsies and polypectomy  03/2003, 05/2008, 02/20/2011    severe diverticulosis, tubulovillous adenoma polyp, internal and external hemorrhoids 2012: severe diverticulosis, 4 mm polyp, hemorrhoids  . Cataracts Bilateral   . Fractured wrist Left 2013    repair  . Total knee arthroplasty  12/20/2011    Procedure: TOTAL KNEE ARTHROPLASTY;  Surgeon: Tobi Bastos, MD;  Location: [facility];  Service: Orthopedics;  Laterality: Left;  . Pacemaker generator change  06/18/12    SJM [employer] DR RF, Dr Rayann Heman  . Cardiac catheterization  1999    Normal coronaries  . Permanent pacemaker  generator change N/A 06/18/2012    Procedure: PERMANENT PACEMAKER GENERATOR CHANGE;  Surgeon: Thompson Grayer, MD;  Location: [facility] CATH LAB;  Service: Cardiovascular;  Laterality: N/A;  . Insert / replace / remove pacemaker    . Dilation and curettage of uterus    . Eye surgery    . Knee arthroscopy with medial menisectomy Right 10/21/2014    Procedure: RIGHT KNEE ARTHROSCOPY WITH MEDIAL MENISECTOM,lateral menisectomy,synovectomy suprapatellar pouch;  Surgeon: Latanya Maudlin, MD;  Location: [facility];  Service: Orthopedics;  Laterality: Right;  . Breast lumpectomy  1980's    Right breast   . Total knee arthroplasty Right 02/10/2015    Procedure: TOTAL RIGHT KNEE ARTHROPLASTY;  Surgeon: Latanya Maudlin, MD;  Location: [facility];  Service: Orthopedics;  Laterality: Right;    There were no vitals filed for this visit.  Visit Diagnosis:  Unsteadiness  Knee stiffness, right  Right knee pain  Localized edema  Left knee pain  Knee stiffness, left      Subjective Assessment - 05/11/15 0957    Subjective Reports that her knee doesn't bother her or hurt much anymore.   Limitations Walking   How long can you walk comfortably? one hour   Currently in Pain? No/denies            [facility] PT Assessment - 05/11/15 0001  Assessment   Medical Diagnosis Right total knee replacement.   Onset Date/Surgical Date 02/10/15                     [facility] Adult PT Treatment/Exercise - 05/11/15 0001    Ambulation/Gait   Stairs Yes   Stairs Assistance 6: Modified independent (Device/Increase time)   Stair Management Technique One rail Right;Alternating pattern;Step to pattern;Forwards;With cane   Number of Stairs 12   Height of Stairs 8   Knee/Hip Exercises: Aerobic   Nustep L5 x12 min             Balance Exercises - 05/11/15 1046    Balance Exercises: Standing   Standing Eyes Opened Narrow base of support (BOS);Head turns;Foam/compliant surface  x3 min   Tandem Stance Eyes  open;Foam/compliant surface;Intermittent upper extremity support  x3 min   Rockerboard Anterior/posterior;EO;Other time (comment);Intermittent UE support  x3 min   Other Standing Exercises DLS on Bosu x4 min with intermittant UE support; toe taps B 8" step x30 rep             PT Short Term Goals - 04/07/15 1517    PT SHORT TERM GOAL #1   Title Ind with an HEP.   Time 2   Period Weeks   Status Achieved   PT SHORT TERM GOAL #2   Title Achieve full active right kne extesnion.   Time 2   Period Weeks   Status Achieved  R knee ext AROM 0 deg 04/07/2015           PT Long Term Goals - 05/11/15 1044    PT LONG TERM GOAL #1   Title Active right knee flxion= 125 degrees.   Time 4   Period Weeks   Status Achieved   PT LONG TERM GOAL #2   Title Perform  ADL's with pain not > 2-3/10.   Time 4   Status Achieved   PT LONG TERM GOAL #3   Title Perform a reciprocating stair gait wiht pain not > 2-3/10.   Baseline able to ascend reciprocally   Time 4   Period Weeks   Status Achieved   PT LONG TERM GOAL #4   Title Walk with a straight cane in clinic 500 feet with supervision only.   Time 4   Period Weeks   Status Achieved               Plan - 05/11/15 1038    Clinical Impression Statement Patient tolerated today's treatment fairly well although patient noted that she did better with balance exercises in previous treatment. Achieved all goals set at evaluation with the acheivement of reciprical stair ambulation with handrail and SPC although patient may be more confident with nonreciprical stair ambulation.During balance exercises patient required intermittant UE support to regain balance in parallel bars. Patient had tendency to lose balance posteriorly. Had increased difficulty with balance when weightbearing more on RLE and with head turns to the R side. Accepted balance HEP provided by MPT and verbalized understanding with and was aware of need for someone to be present  with her for safety. Only complaint of pain was verbalized during toe taps to 8" step in L hip. Gave no numerical pain rating for L hip pain following today's treatment.    Pt will benefit from skilled therapeutic intervention in order to improve on the following deficits Pain;Decreased activity tolerance;Increased edema;Decreased strength;Decreased range of motion   Rehab Potential Excellent   PT Frequency  3x / week   PT Duration 4 weeks   PT Treatment/Interventions ADLs/Self Care Home Management;Cryotherapy;Therapeutic exercise;Therapeutic activities;Neuromuscular re-education;Patient/family education;Manual techniques;Passive range of motion;Vasopneumatic Device   PT Next Visit Plan Commuicate need for discharge summary to MPT.   Consulted and Agree with Plan of Care Patient          G-Codes - 2015/06/04 1306    Functional Assessment Tool Used Clinical judgement.   Functional Limitation Mobility: Walking and moving around   Mobility: Walking and Moving Around Current Status [phone]) At least 40 percent but less than 60 percent impaired, limited or restricted   Mobility: Walking and Moving Around Goal Status [phone]) At least 40 percent but less than 60 percent impaired, limited or restricted   Mobility: Walking and Moving Around Discharge Status [phone]) At least 40 percent but less than 60 percent impaired, limited or restricted      Problem List Patient Active Problem List   Diagnosis Date Noted  . History of total knee arthroplasty 02/10/2015  . Chest pain 04/18/2014  . [place] anemia 12/08/2013  . A-fib ([place]) 12/08/2013  . Sick sinus syndrome ([place]) 03/22/2011  . Hyperlipidemia 02/02/2009  . Essential hypertension, benign 02/02/2009  . Paroxysmal atrial fibrillation ([place]) 05/15/2008  . COLONIC POLYPS, ADENOMATOUS, HX OF 05/15/2008   PHYSICAL THERAPY DISCHARGE SUMMARY  Visits from Start of Care: 12  Current functional level related to goals / functional outcomes: Please see  above.   Remaining deficits: All goals met.   Education / Equipment: HEP.  Plan: Patient agrees to discharge.  Patient goals were met. Patient is being discharged due to meeting the stated rehab goals.  ?????       Jackey Housey, [place] MPT June 04, 2015, 1:07 PM  [facility] [address] [place], [place], [address] Phone: [phone]   Fax:  [phone]  Name: [name] MRN: [id number] Date of Birth: 08-02-1928

## 2015-05-31 ENCOUNTER — Other Ambulatory Visit: Payer: Self-pay | Admitting: *Deleted

## 2015-05-31 MED ORDER — AMIODARONE HCL 200 MG PO TABS: 100.0000 mg | ORAL_TABLET | Freq: Every day | ORAL | Status: DC

## 2015-06-17 ENCOUNTER — Telehealth: Payer: Self-pay | Admitting: Cardiology

## 2015-06-17 ENCOUNTER — Ambulatory Visit (INDEPENDENT_AMBULATORY_CARE_PROVIDER_SITE_OTHER): Payer: Medicare Other | Admitting: *Deleted

## 2015-06-17 DIAGNOSIS — I495 Sick sinus syndrome: Secondary | ICD-10-CM

## 2015-06-17 NOTE — Telephone Encounter (Signed)
Spoke with pt and reminded pt of remote transmission that is due today. Pt verbalized understanding.   

## 2015-06-18 NOTE — Progress Notes (Signed)
Remote pacemaker transmission.   

## 2015-07-06 LAB — CUP PACEART REMOTE DEVICE CHECK
Brady Statistic AP VP Percent: 75 %
Brady Statistic AP VS Percent: 24 %
Brady Statistic RA Percent Paced: 43 %
Brady Statistic RV Percent Paced: 77 %
Date Time Interrogation Session: 20170105082648
Implantable Lead Implant Date: 20031028
Lead Channel Impedance Value: 450 Ohm
Lead Channel Pacing Threshold Amplitude: 1 V
Lead Channel Pacing Threshold Pulse Width: 0.5 ms
Lead Channel Sensing Intrinsic Amplitude: 12 mV
Lead Channel Setting Pacing Amplitude: 2 V
Lead Channel Setting Pacing Pulse Width: 0.5 ms
Lead Channel Setting Sensing Sensitivity: 2 mV
MDC IDC LEAD IMPLANT DT: 20031028
MDC IDC LEAD LOCATION: 753859
MDC IDC LEAD LOCATION: 753860
MDC IDC MSMT BATTERY REMAINING LONGEVITY: 98 mo
MDC IDC MSMT BATTERY REMAINING PERCENTAGE: 95.5 %
MDC IDC MSMT BATTERY VOLTAGE: 2.96 V
MDC IDC MSMT LEADCHNL RA IMPEDANCE VALUE: 350 Ohm
MDC IDC MSMT LEADCHNL RA PACING THRESHOLD AMPLITUDE: 1 V
MDC IDC MSMT LEADCHNL RA SENSING INTR AMPL: 0.3 mV
MDC IDC MSMT LEADCHNL RV PACING THRESHOLD PULSEWIDTH: 0.5 ms
MDC IDC SET LEADCHNL RV PACING AMPLITUDE: 2.5 V
MDC IDC STAT BRADY AS VP PERCENT: 1 %
MDC IDC STAT BRADY AS VS PERCENT: 1 %
Pulse Gen Model: 2210
Pulse Gen Serial Number: 7438567

## 2015-07-09 ENCOUNTER — Encounter: Payer: Self-pay | Admitting: Cardiology

## 2015-08-12 ENCOUNTER — Telehealth: Payer: Self-pay | Admitting: Cardiology

## 2015-08-12 NOTE — Telephone Encounter (Signed)
Patient would like to speak with nurse about her insurance not covering Eliquis

## 2015-08-12 NOTE — Telephone Encounter (Signed)
Patient said she received a letter stating that her insurance will not cover eliquis. Patient advised to have her pharmacy contact us directly with rejection notice so that office will know how to respond in order to get eliquis covered. Patient verbalized understanding of plan.

## 2015-08-13 ENCOUNTER — Other Ambulatory Visit: Payer: Self-pay | Admitting: *Deleted

## 2015-08-13 MED ORDER — APIXABAN 5 MG PO TABS: 5.0000 mg | ORAL_TABLET | Freq: Two times a day (BID) | ORAL | Status: DC

## 2015-08-19 ENCOUNTER — Emergency Department (HOSPITAL_COMMUNITY): Payer: Medicare Other

## 2015-08-19 ENCOUNTER — Emergency Department (HOSPITAL_COMMUNITY)
Admission: EM | Admit: 2015-08-19 | Discharge: 2015-08-19 | Disposition: A | Discharge status: Home / Self Care | Payer: Medicare Other | Attending: Emergency Medicine | Admitting: Emergency Medicine

## 2015-08-19 ENCOUNTER — Encounter (HOSPITAL_COMMUNITY): Payer: Self-pay | Admitting: Emergency Medicine

## 2015-08-19 ENCOUNTER — Ambulatory Visit: Payer: Self-pay | Admitting: Cardiology

## 2015-08-19 DIAGNOSIS — I480 Paroxysmal atrial fibrillation: Secondary | ICD-10-CM | POA: Diagnosis not present

## 2015-08-19 DIAGNOSIS — Z79899 Other long term (current) drug therapy: Secondary | ICD-10-CM | POA: Insufficient documentation

## 2015-08-19 DIAGNOSIS — Z8639 Personal history of other endocrine, nutritional and metabolic disease: Secondary | ICD-10-CM | POA: Insufficient documentation

## 2015-08-19 DIAGNOSIS — Z87440 Personal history of urinary (tract) infections: Secondary | ICD-10-CM | POA: Diagnosis not present

## 2015-08-19 DIAGNOSIS — H409 Unspecified glaucoma: Secondary | ICD-10-CM | POA: Diagnosis not present

## 2015-08-19 DIAGNOSIS — Z853 Personal history of malignant neoplasm of breast: Secondary | ICD-10-CM | POA: Diagnosis not present

## 2015-08-19 DIAGNOSIS — Z86718 Personal history of other venous thrombosis and embolism: Secondary | ICD-10-CM | POA: Insufficient documentation

## 2015-08-19 DIAGNOSIS — Z9889 Other specified postprocedural states: Secondary | ICD-10-CM | POA: Insufficient documentation

## 2015-08-19 DIAGNOSIS — M1990 Unspecified osteoarthritis, unspecified site: Secondary | ICD-10-CM | POA: Insufficient documentation

## 2015-08-19 DIAGNOSIS — K219 Gastro-esophageal reflux disease without esophagitis: Secondary | ICD-10-CM | POA: Diagnosis not present

## 2015-08-19 DIAGNOSIS — R079 Chest pain, unspecified: Secondary | ICD-10-CM | POA: Diagnosis present

## 2015-08-19 DIAGNOSIS — Z8701 Personal history of pneumonia (recurrent): Secondary | ICD-10-CM | POA: Diagnosis not present

## 2015-08-19 DIAGNOSIS — K5900 Constipation, unspecified: Secondary | ICD-10-CM | POA: Diagnosis not present

## 2015-08-19 DIAGNOSIS — Z7901 Long term (current) use of anticoagulants: Secondary | ICD-10-CM | POA: Diagnosis not present

## 2015-08-19 DIAGNOSIS — I10 Essential (primary) hypertension: Secondary | ICD-10-CM | POA: Insufficient documentation

## 2015-08-19 DIAGNOSIS — Z950 Presence of cardiac pacemaker: Secondary | ICD-10-CM | POA: Insufficient documentation

## 2015-08-19 DIAGNOSIS — R1013 Epigastric pain: Secondary | ICD-10-CM | POA: Insufficient documentation

## 2015-08-19 DIAGNOSIS — Z86010 Personal history of colonic polyps: Secondary | ICD-10-CM | POA: Insufficient documentation

## 2015-08-19 LAB — CBC
HEMATOCRIT: 37.9 % (ref 36.0–46.0)
Hemoglobin: 12.7 g/dL (ref 12.0–15.0)
MCH: 32.3 pg (ref 26.0–34.0)
MCHC: 33.5 g/dL (ref 30.0–36.0)
MCV: 96.4 fL (ref 78.0–100.0)
PLATELETS: 171 10*3/uL (ref 150–400)
RBC: 3.93 MIL/uL (ref 3.87–5.11)
RDW: 14 % (ref 11.5–15.5)
WBC: 4.3 10*3/uL (ref 4.0–10.5)

## 2015-08-19 LAB — COMPREHENSIVE METABOLIC PANEL
ALBUMIN: 3.8 g/dL (ref 3.5–5.0)
ALK PHOS: 59 U/L (ref 38–126)
ALT: 15 U/L (ref 14–54)
ANION GAP: 11 (ref 5–15)
AST: 20 U/L (ref 15–41)
BUN: 8 mg/dL (ref 6–20)
CHLORIDE: 104 mmol/L (ref 101–111)
CO2: 26 mmol/L (ref 22–32)
CREATININE: 0.75 mg/dL (ref 0.44–1.00)
Calcium: 10 mg/dL (ref 8.9–10.3)
GFR calc Af Amer: >60 mL/min (ref >60)
GFR calc non Af Amer: >60 mL/min (ref >60)
GLUCOSE: 105 mg/dL — AB (ref 65–99)
Potassium: 4.4 mmol/L (ref 3.5–5.1)
SODIUM: 141 mmol/L (ref 135–145)
TOTAL PROTEIN: 6.9 g/dL (ref 6.5–8.1)
Total Bilirubin: 0.8 mg/dL (ref 0.3–1.2)

## 2015-08-19 LAB — I-STAT TROPONIN, ED: Troponin i, poc: 0 ng/mL (ref 0.00–0.08)

## 2015-08-19 LAB — URINALYSIS, ROUTINE W REFLEX MICROSCOPIC
Bilirubin Urine: NEGATIVE
GLUCOSE, UA: NEGATIVE mg/dL
HGB URINE DIPSTICK: NEGATIVE
KETONES UR: NEGATIVE mg/dL
LEUKOCYTES UA: NEGATIVE
Nitrite: NEGATIVE
PROTEIN: NEGATIVE mg/dL
Specific Gravity, Urine: 1.007 (ref 1.005–1.030)
pH: 7 (ref 5.0–8.0)

## 2015-08-19 LAB — LIPASE, BLOOD: LIPASE: 40 U/L (ref 11–51)

## 2015-08-19 LAB — TROPONIN I

## 2015-08-19 MED ORDER — OMEPRAZOLE 20 MG PO CPDR: 20.0000 mg | DELAYED_RELEASE_CAPSULE | Freq: Every day | ORAL | Status: DC

## 2015-08-19 MED ORDER — GI COCKTAIL ~~LOC~~
30.0000 mL | Freq: Once | ORAL | Status: AC
  Administered 2015-08-19: 30 mL via ORAL
  Filled 2015-08-19: qty 30

## 2015-08-19 NOTE — Discharge Instructions (Signed)
Medications: Take protonix as directed, continue usual home medications Please follow up with both your cardiologist and GI doctor for discussion of your diagnoses and further evaluation after today's visit; Please return to the ER for any new or worsening symptoms, any additional concerns.

## 2015-08-19 NOTE — ED Notes (Signed)
Per EMS, pt reports central CP since last night. Pt woke up and continued having CP. Pt took 2 nitros with no relief. Pt alert x4. NAD at this time.

## 2015-08-19 NOTE — ED Provider Notes (Signed)
CSN: [id number]     Arrival date & time 08/19/15  1103 History   First MD Initiated Contact with Patient 08/19/15 1110     Chief Complaint  Patient presents with  . Chest Pain     (Consider location/radiation/quality/duration/timing/severity/associated sxs/prior Treatment) Patient is a 82 y.o. female presenting with chest pain. The history is provided by the patient and medical records. No language interpreter was used.  Chest Pain Associated symptoms: no back pain, no cough, no dizziness, no fever, no headache, no nausea, no palpitations, no shortness of breath, not vomiting and no weakness    [name] is a 82 y.o. female  with a PMH of HTN, afib, HLD, sick sinus syndrome with pacemaker, GERD who presents to the Emergency Department complaining of "squeezing" intermittent epigastric pain that radiates upward substernally since last night. Patient denies history of similar symptoms. She took two NTG PTA with no relief. No alleviating or aggravating factors noted. Denies fever, shortness of breath. Taking all medications as directed.   Past Medical History  Diagnosis Date  . Arthritis   . Glaucoma   . Diverticulosis   . Mixed hyperlipidemia   . Essential hypertension, benign   . Colon polyps   . Paroxysmal atrial fibrillation (HCC)   . Atrial flutter ([place])     s/p RFA 2003  . Sick sinus syndrome (HCC)     s/p PPM ([facility])  . Varicose veins   . IBS (irritable bowel syndrome)   . Osteoporosis   . Urinary tract infection   . GERD (gastroesophageal reflux disease)   . DVT, lower extremity ([place]) 1998 and 1999    Bilateral  . Pneumonia 2011  . Breast cancer ([place])     1980's - lumpectomy only   Past Surgical History  Procedure Laterality Date  . Cholecystectomy    . Pacemaker insertion  2003  . Colonoscopy w/ biopsies and polypectomy  03/2003, 05/2008, 02/20/2011    severe diverticulosis, tubulovillous adenoma polyp, internal and external hemorrhoids 2012: severe  diverticulosis, 4 mm polyp, hemorrhoids  . Cataracts Bilateral   . Fractured wrist Left 2013    repair  . Total knee arthroplasty  12/20/2011    Procedure: TOTAL KNEE ARTHROPLASTY;  Surgeon: Tobi Bastos, MD;  Location: [facility];  Service: Orthopedics;  Laterality: Left;  . Pacemaker generator change  06/18/12    SJM Acc[employer] DR RF, Dr Rayann Heman  . Cardiac catheterization  1999    Normal coronaries  . Permanent pacemaker generator change N/A 06/18/2012    Procedure: PERMANENT PACEMAKER GENERATOR CHANGE;  Surgeon: Thompson Grayer, MD;  Location: [facility] CATH LAB;  Service: Cardiovascular;  Laterality: N/A;  . Insert / replace / remove pacemaker    . Dilation and curettage of uterus    . Eye surgery    . Knee arthroscopy with medial menisectomy Right 10/21/2014    Procedure: RIGHT KNEE ARTHROSCOPY WITH MEDIAL MENISECTOM,lateral menisectomy,synovectomy suprapatellar pouch;  Surgeon: Latanya Maudlin, MD;  Location: [facility];  Service: Orthopedics;  Laterality: Right;  . Breast lumpectomy  1980's    Right breast   . Total knee arthroplasty Right 02/10/2015    Procedure: TOTAL RIGHT KNEE ARTHROPLASTY;  Surgeon: Latanya Maudlin, MD;  Location: [facility];  Service: Orthopedics;  Laterality: Right;   Family History  Problem Relation Age of Onset  . Colon cancer Brother   . Colon cancer Other     Grandfather  . Cancer Father   . Stroke Mother   . Heart attack  CHF grandmother   Social History  Substance Use Topics  . Smoking status: Never Smoker   . Smokeless tobacco: Never Used  . Alcohol Use: No   OB History    No data available     Review of Systems  Constitutional: Negative for fever and appetite change.  HENT: Negative for congestion and sore throat.   Eyes: Negative for visual disturbance.  Respiratory: Negative for cough, shortness of breath and wheezing.   Cardiovascular: Positive for chest pain. Negative for palpitations and leg swelling.  Gastrointestinal: Positive for constipation.  Negative for nausea, vomiting and diarrhea.  Genitourinary: Negative for dysuria.  Musculoskeletal: Negative for back pain and neck pain.  Skin: Negative for rash.  Neurological: Negative for dizziness, weakness and headaches.      Allergies  Iodinated diagnostic agents and Naproxen  Home Medications   Prior to Admission medications   Medication Sig Start Date End Date Taking? Authorizing Provider  acetaminophen (TYLENOL) 500 MG tablet Take 1,000 mg by mouth every 6 (six) hours as needed for mild pain.   Yes Historical Provider, MD  amiodarone (PACERONE) 200 MG tablet Take 0.5 tablets (100 mg total) by mouth daily. 05/31/15  Yes Satira Sark, MD  apixaban (ELIQUIS) 5 MG TABS tablet Take 1 tablet (5 mg total) by mouth 2 (two) times daily. 08/13/15  Yes Satira Sark, MD  latanoprost (XALATAN) 0.005 % ophthalmic solution Place 1 drop into both eyes at bedtime.   Yes Historical Provider, MD  Multiple Vitamin (MULTIVITAMIN) tablet Take 1 tablet by mouth every morning.    Yes Historical Provider, MD  nadolol (CORGARD) 40 MG tablet TAKE ONE TABLET BY MOUTH DAILY WITH BREAKFAST 07/08/14  Yes Thompson Grayer, MD  nitroGLYCERIN (NITROSTAT) 0.4 MG SL tablet Place 1 tablet (0.4 mg total) under the tongue every 5 (five) minutes as needed for chest pain. 04/28/15  Yes Satira Sark, MD  cyanocobalamin (,VITAMIN B-12,) 1000 MCG/ML injection Inject 1,000 mcg into the muscle every 30 (thirty) days.      Historical Provider, MD  omeprazole (PRILOSEC) 20 MG capsule Take 1 capsule (20 mg total) by mouth daily. 08/19/15   [name] Almond Kamryn Messineo, PA-C  tiZANidine (ZANAFLEX) 4 MG tablet Take 1 tablet (4 mg total) by mouth every 8 (eight) hours as needed. Patient not taking: Reported on 08/19/2015 02/11/15   Amber Constable, PA-C   BP 151/68 mmHg  Pulse 59  Temp(Src) 98.5 F (36.9 C) (Oral)  Resp 11  SpO2 99% Physical Exam  Constitutional: She is oriented to person, place, and time. She appears  well-developed and well-nourished.  Alert, speaking in full sentences, and in no acute distress.  HENT:  Head: Normocephalic and atraumatic.  Cardiovascular: Normal rate, regular rhythm, normal heart sounds and intact distal pulses.  Exam reveals no gallop and no friction rub.   No murmur heard. Pulmonary/Chest: Effort normal and breath sounds normal. No respiratory distress. She has no wheezes. She has no rales. She exhibits no tenderness.  Abdominal: Soft. Bowel sounds are normal. She exhibits no distension and no mass. There is tenderness (epigastric). There is no rebound and no guarding.  Musculoskeletal: She exhibits no edema.  Neurological: She is alert and oriented to person, place, and time.  Skin: Skin is warm and dry. No rash noted.  Psychiatric: She has a normal mood and affect. Her behavior is normal. Judgment and thought content normal.  Nursing note and vitals reviewed.   ED Course  Procedures (including critical care time)  Labs Review Labs Reviewed  COMPREHENSIVE METABOLIC PANEL - Abnormal; Notable for the following:    Glucose, Bld 105 (*)    All other components within normal limits  CBC  LIPASE, BLOOD  URINALYSIS, ROUTINE W REFLEX MICROSCOPIC (NOT AT [facility])  TROPONIN I  I-STAT TROPOININ, ED    Imaging Review Dg Chest Port 1 View  08/19/2015  CLINICAL DATA:  Chest pain, shortness of breath, history mixed hyperlipidemia, essential benign hypertension, paroxysmal atrial fibrillation, breast cancer EXAM: PORTABLE CHEST 1 VIEW COMPARISON:  Portable exam 1129 hours compared to 02/10/2015 FINDINGS: RIGHT subclavian transvenous pacemaker leads project over RIGHT atrium and RIGHT ventricle. Enlargement of cardiac silhouette. Mediastinal contours and pulmonary vascularity normal. Lungs clear. No pleural effusion or pneumothorax. Bones demineralized. IMPRESSION: Enlargement of cardiac silhouette post pacemaker. No acute abnormalities. Electronically Signed   By: Lavonia Dana M.D.    On: 08/19/2015 11:47   US Abdomen Limited Ruq  08/19/2015  CLINICAL DATA:  Epigastric pain for 1 day EXAM: US ABDOMEN LIMITED - RIGHT UPPER QUADRANT COMPARISON:  None. FINDINGS: Gallbladder: Surgically removed Common bile duct: Diameter: 0.2 mm. Liver: No focal lesion identified. Within normal limits in parenchymal echogenicity. IMPRESSION: Status post cholecystectomy.  No acute abnormality noted. Electronically Signed   By: Inez Catalina M.D.   On: 08/19/2015 12:51   I have personally reviewed and evaluated these images and lab results as part of my medical decision-making.   EKG Interpretation   Date/Time:  Thursday August 19 2015 11:04:11 EST Ventricular Rate:  61 PR Interval:  107 QRS Duration: 178 QT Interval:  511 QTC Calculation: 515 R Axis:   -71 Text Interpretation:  Sinus or ectopic atrial rhythm Short PR interval LVH  with IVCD, LAD and secondary repol abnrm Prolonged QT interval Confirmed  by Lita Mains  MD, DAVID ([id number]) on 08/19/2015 11:49:52 AM      MDM   Final diagnoses:  Epigastric pain   Fowler presents with epigastric pain radiating to substernal chest. On exam, patient with epigastric tenderness. Likely GI etiology, but given significant cardiac history (HTN, SSS, afib, pacemaker in place) cardiac etiology in ddx.   Labs: CBC, CMP, lipase, UA, troponin all reviewed and reassuring Imaging: CXR and abdominal US with no acute abnormalities.  Therapeutics: GI cocktail  1:15 PM - Patient re-evaluated and feels much improved.   3:47 PM - 2nd troponin <0.03. Given labs, xray, H&P cardiac etiology unlikely however recommend cardio follow up.   A&P: Will start PPI.  Discussed importance of follow up with cardiology and GI. Return precautions discussed. Diet discussed. All questions answered.   Patient seen by and discussed with Dr. Lita Mains who agrees with treatment plan.   [facility] Madolyn Ackroyd, PA-C 08/19/15 1558  Julianne Rice, MD 08/21/15 [phone]

## 2015-08-24 ENCOUNTER — Telehealth: Payer: Self-pay | Admitting: Internal Medicine

## 2015-08-24 NOTE — Telephone Encounter (Signed)
Patient is scheduled for Alonza Bogus, PA on 09/01/15 11:00.  [facility] notified

## 2015-08-24 NOTE — Telephone Encounter (Signed)
I spoke with [name].  Patient does not have transportation for today or tomorrow.  Lavella Lemons indicated could be seen in the next few weeks.  She is advised I will call back later today when I have an APP schedule opened.

## 2015-08-24 NOTE — Telephone Encounter (Signed)
Caller name: Lavella Lemons Relation to pt: [facility] Provider Call back number: [phone]   Reason for call:  Dr. Edrick Oh and Darleene Cleaver, PA want this patient seen within the week.  I offered today, but patient declined.  Pt was seen in the ED for chest pain; they want pt seen here for severe reflux symptoms.  Hx with Dr. Carlean Purl.

## 2015-09-01 ENCOUNTER — Encounter: Payer: Self-pay | Admitting: Gastroenterology

## 2015-09-01 ENCOUNTER — Ambulatory Visit (INDEPENDENT_AMBULATORY_CARE_PROVIDER_SITE_OTHER): Payer: Medicare Other | Admitting: Gastroenterology

## 2015-09-01 ENCOUNTER — Telehealth: Payer: Self-pay

## 2015-09-01 VITALS — BP 140/90 | HR 80 | Ht 64.0 in | Wt 172.4 lb

## 2015-09-01 DIAGNOSIS — Z7901 Long term (current) use of anticoagulants: Secondary | ICD-10-CM

## 2015-09-01 DIAGNOSIS — K219 Gastro-esophageal reflux disease without esophagitis: Secondary | ICD-10-CM | POA: Diagnosis not present

## 2015-09-01 DIAGNOSIS — I4819 Other persistent atrial fibrillation: Secondary | ICD-10-CM | POA: Insufficient documentation

## 2015-09-01 DIAGNOSIS — R0789 Other chest pain: Secondary | ICD-10-CM | POA: Insufficient documentation

## 2015-09-01 DIAGNOSIS — R1013 Epigastric pain: Secondary | ICD-10-CM | POA: Insufficient documentation

## 2015-09-01 DIAGNOSIS — I481 Persistent atrial fibrillation: Secondary | ICD-10-CM

## 2015-09-01 NOTE — Telephone Encounter (Signed)
[facility] [address] [place] [address]  09/01/2015   RE: ALYSABETH HIRSCH DOB: [birth year] MRN: [id number]   Dear Rozann Lesches MD,    We have scheduled the above patient for an endoscopic procedure. Our records show that she is on anticoagulation therapy.   Please advise as to how long the patient may come off her therapy of Eliquis  prior to the EGD procedure, which is scheduled for 09/13/15.     Please fax back/ or route the completed form to Derral Colucci [place], [place] at [phone].   Sincerely,    Silvano Rusk, MD

## 2015-09-01 NOTE — Telephone Encounter (Signed)
Generally, 48 hours would be adequate.

## 2015-09-01 NOTE — Progress Notes (Signed)
09/01/2015 [name] [id number] 14-Jan-1929   HISTORY OF PRESENT ILLNESS:  This is a pleasant 82 year old female known to Dr. Carlean Purl for previous colonoscopies.  She is here today at the request of her PCP, Dr. Edrick Oh, for evaluation regarding chest pain, epigastric pain, and GERD.  She says that she's had intermittent issues with heartburn/reflux for which she has taken Tums in the past, but it seems to be getting worse.  Then, on 3/9 she had central chest pain that was somewhat burning in nature and spread to both sides of her chest.  She went to the ED and EKG/troponins were ok.  Was given GI cocktail with some improvement.  Was started on omeprazole 20 mg daily.  Told to follow-up with GI.  She does follow with Dr. Domenic Polite, cardiology, regularly since she has a pacemaker and is on Eliquis for atrial fibrillation.  Says that since she has been taking the omeprazole she has not experienced any similar pain to what took her to the hospital.  Also reports some epigastric soreness as well.  Denies NSAID use.      Past Medical History  Diagnosis Date  . Arthritis   . Glaucoma   . Diverticulosis   . Mixed hyperlipidemia   . Essential hypertension, benign   . Colon polyps   . Paroxysmal atrial fibrillation (HCC)   . Atrial flutter ([place])     s/p RFA 2003  . Sick sinus syndrome (HCC)     s/p PPM ([facility])  . Varicose veins   . IBS (irritable bowel syndrome)   . Osteoporosis   . Urinary tract infection   . GERD (gastroesophageal reflux disease)   . DVT, lower extremity ([place]) 1998 and 1999    Bilateral  . Pneumonia 2011  . Breast cancer ([place])     1980's - lumpectomy only   Past Surgical History  Procedure Laterality Date  . Cholecystectomy    . Pacemaker insertion  2003  . Colonoscopy w/ biopsies and polypectomy  03/2003, 05/2008, 02/20/2011    severe diverticulosis, tubulovillous adenoma polyp, internal and external hemorrhoids 2012: severe diverticulosis, 4 mm polyp,  hemorrhoids  . Cataracts Bilateral   . Fractured wrist Left 2013    repair  . Total knee arthroplasty  12/20/2011    Procedure: TOTAL KNEE ARTHROPLASTY;  Surgeon: Tobi Bastos, MD;  Location: [facility];  Service: Orthopedics;  Laterality: Left;  . Pacemaker generator change  06/18/12    [facility]JM [employer] DR RF, Dr Rayann Heman  . Cardiac catheterization  1999    Normal coronaries  . Permanent pacemaker generator change N/A 06/18/2012    Procedure: PERMANENT PACEMAKER GENERATOR CHANGE;  Surgeon: Thompson Grayer, MD;  Location: [facility] CATH LAB;  Service: Cardiovascular;  Laterality: N/A;  . Insert / replace / remove pacemaker    . Dilation and curettage of uterus    . Eye surgery    . Knee arthroscopy with medial menisectomy Right 10/21/2014    Procedure: RIGHT KNEE ARTHROSCOPY WITH MEDIAL MENISECTOM,lateral menisectomy,synovectomy suprapatellar pouch;  Surgeon: Latanya Maudlin, MD;  Location: [facility];  Service: Orthopedics;  Laterality: Right;  . Breast lumpectomy  1980's    Right breast   . Total knee arthroplasty Right 02/10/2015    Procedure: TOTAL RIGHT KNEE ARTHROPLASTY;  Surgeon: Latanya Maudlin, MD;  Location: [facility];  Service: Orthopedics;  Laterality: Right;    reports that she has never smoked. She has never used smokeless tobacco. She reports that she does  not drink alcohol or use illicit drugs. family history includes Alzheimer's disease in her sister; Cancer in her father; Colon cancer in her brother and other; Stroke in her mother. Allergies  Allergen Reactions  . Iodinated Diagnostic Agents Hives, Itching and Rash    03/13/14: symptoms began within two hours of intrathecal injection (myelogram 03/11/14); on shoulders radiating down to stomach.  Relief with Benadryl.  Told patient she would need to pre-med with Benadryl in the future before receiving this contrast.  Brita Romp, RN  . Naproxen Hives, Itching and Rash      Outpatient Encounter Prescriptions as of 09/01/2015  Medication Sig  .  acetaminophen (TYLENOL) 500 MG tablet Take 1,000 mg by mouth every 6 (six) hours as needed for mild pain.  Marland Kitchen amiodarone (PACERONE) 200 MG tablet Take 0.5 tablets (100 mg total) by mouth daily.  Marland Kitchen apixaban (ELIQUIS) 5 MG TABS tablet Take 1 tablet (5 mg total) by mouth 2 (two) times daily.  . cyanocobalamin (,VITAMIN B-12,) 1000 MCG/ML injection Inject 1,000 mcg into the muscle every 30 (thirty) days.    Marland Kitchen latanoprost (XALATAN) 0.005 % ophthalmic solution Place 1 drop into both eyes at bedtime.  . Multiple Vitamin (MULTIVITAMIN) tablet Take 1 tablet by mouth every morning.   . nadolol (CORGARD) 40 MG tablet TAKE ONE TABLET BY MOUTH DAILY WITH BREAKFAST  . nitroGLYCERIN (NITROSTAT) 0.4 MG SL tablet Place 1 tablet (0.4 mg total) under the tongue every 5 (five) minutes as needed for chest pain.  Marland Kitchen omeprazole (PRILOSEC) 20 MG capsule Take 1 capsule (20 mg total) by mouth daily.  . [DISCONTINUED] tiZANidine (ZANAFLEX) 4 MG tablet Take 1 tablet (4 mg total) by mouth every 8 (eight) hours as needed. (Patient not taking: Reported on 08/19/2015)   Facility-Administered Encounter Medications as of 09/01/2015  Medication  . bupivacaine liposome (EXPAREL) 1.3 % injection 266 mg     REVIEW OF SYSTEMS  : All other systems reviewed and negative except where noted in the History of Present Illness.   PHYSICAL EXAM: BP 140/90 mmHg  Pulse 80  Ht 5\' 4"  (1.626 m)  Wt 172 lb 6 oz (78.189 kg)  BMI 29.57 kg/m2 General: Well developed white female in no acute distress Head: Normocephalic and atraumatic Eyes:  Sclerae anicteric, conjunctiva pink. Ears: Normal auditory acuity  Lungs: Clear throughout to auscultation Heart: Irregularly irregular. Abdomen: Soft, non-distended.  Normal bowel sounds.  Mild epigastric TTP without R/RG. Musculoskeletal: Symmetrical with no gross deformities  Skin: No lesions on visible extremities Extremities: No edema  Neurological: Alert oriented x 4, grossly  non-focal Psychological:  Alert and cooperative. Normal mood and affect  ASSESSMENT AND PLAN: -Atypical chest pain and GERD:  Chest pain thought to be non-cardiac related.  Follows regularly with cardiology.  Also with some epigastric pain.  Already somewhat improved on omeprazole 20 mg daily.  Will increase to 40 mg daily for now and will schedule for EGD with Dr. Carlean Purl to evaluate/ rule out esophagitis, ulcer, etc. -Atrial fibrillation, on Eliquis:  Will hold Eliquis for 2 days prior to endoscopic procedures - will instruct when and how to resume after procedure. Benefits and risks of procedure explained including risks of bleeding, perforation, infection, missed lesions, reactions to medications and possible need for hospitalization and surgery for complications. Additional rare but real risk of stroke or other vascular clotting events off of Eliquis also explained and need to seek urgent help if any signs of these problems occur. Will communicate by phone or  EMR with patient's prescribing provider, Dr. Domenic Polite to confirm that holding Eliquis is reasonable in this case.   CC:  Dione Housekeeper, MD

## 2015-09-01 NOTE — Patient Instructions (Addendum)
You have been scheduled for an endoscopy. Please follow written instructions given to you at your visit today. If you use inhalers (even only as needed), please bring them with you on the day of your procedure.  You will be contacted by our office prior to your procedure for directions on holding your Eliquis.  If you do not hear from our office 1 week prior to your scheduled procedure, please call [phone] to discuss.     I appreciate the opportunity to care for you.

## 2015-09-02 NOTE — Telephone Encounter (Signed)
Patient informed to hold Eliquis 2 days as instructed by Dr Domenic Polite and she verbalized understanding.

## 2015-09-02 NOTE — Telephone Encounter (Signed)
Routed to Pattie Martinique CMA [facility]

## 2015-09-13 ENCOUNTER — Encounter: Payer: Self-pay | Admitting: Internal Medicine

## 2015-09-13 ENCOUNTER — Ambulatory Visit (AMBULATORY_SURGERY_CENTER): Payer: Medicare Other | Admitting: Internal Medicine

## 2015-09-13 VITALS — BP 106/60 | HR 69 | Temp 97.8°F | Resp 11 | Ht 64.0 in | Wt 172.0 lb

## 2015-09-13 DIAGNOSIS — R1013 Epigastric pain: Secondary | ICD-10-CM

## 2015-09-13 MED ORDER — OMEPRAZOLE 20 MG PO CPDR
20.0000 mg | DELAYED_RELEASE_CAPSULE | Freq: Every day | ORAL | Status: DC
Start: 2015-09-13 — End: 2015-09-14

## 2015-09-13 MED ORDER — SODIUM CHLORIDE 0.9 % IV SOLN: 500.0000 mL | INTRAVENOUS | Status: DC

## 2015-09-13 NOTE — Patient Instructions (Addendum)
   The esophagus, stomach and duodenum all look normal.  I suspect acid reflux has been causing your problems - since you are better on omeprazole please stay on that and see me as needed.  I sent a refill to your pharmacy.  I appreciate the opportunity to care for you. Gatha Mayer, MD, FACG  YOU HAD AN ENDOSCOPIC PROCEDURE TODAY AT [facility]:   Refer to the procedure report that was given to you for any specific questions about what was found during the examination.  If the procedure report does not answer your questions, please call your gastroenterologist to clarify.  If you requested that your care partner not be given the details of your procedure findings, then the procedure report has been included in a sealed envelope for you to review at your convenience later.  YOU SHOULD EXPECT: Some feelings of bloating in the abdomen. Passage of more gas than usual.  Walking can help get rid of the air that was put into your GI tract during the procedure and reduce the bloating.  Please Note:  You might notice some irritation and congestion in your nose or some drainage.  This is from the oxygen used during your procedure.  There is no need for concern and it should clear up in a day or so.  SYMPTOMS TO REPORT IMMEDIATELY:   Following upper endoscopy (EGD)  Vomiting of blood or coffee ground material  New chest pain or pain under the shoulder blades  Painful or persistently difficult swallowing  New shortness of breath  Fever of 100F or higher  Black, tarry-looking stools  For urgent or emergent issues, a gastroenterologist can be reached at any hour by calling [phone].   DIET: Your first meal following the procedure should be a small meal and then it is ok to progress to your normal diet. Heavy or fried foods are harder to digest and may make you feel nauseous or bloated.  Likewise, meals heavy in dairy and vegetables can increase bloating.  Drink plenty  of fluids but you should avoid alcoholic beverages for 24 hours.  ACTIVITY:  You should plan to take it easy for the rest of today and you should NOT DRIVE or use heavy machinery until tomorrow (because of the sedation medicines used during the test).    FOLLOW UP: Our staff will call the number listed on your records the next business day following your procedure to check on you and address any questions or concerns that you may have regarding the information given to you following your procedure. If we do not reach you, we will leave a message.  However, if you are feeling well and you are not experiencing any problems, there is no need to return our call.  We will assume that you have returned to your regular daily activities without incident.  SIGNATURES/CONFIDENTIALITY: You and/or your care partner have signed paperwork which will be entered into your electronic medical record.  These signatures attest to the fact that that the information above on your After Visit Summary has been reviewed and is understood.  Full responsibility of the confidentiality of this discharge information lies with you and/or your care-partner.  Your refill was sent to [facility]- continue Omeprazole indefinitely  Resume you medications, including your Eliquis today

## 2015-09-13 NOTE — Op Note (Signed)
[place] Patient Name: [name] Procedure Date: 09/13/2015 10:32 AM MRN: [id number] Endoscopist: Gatha Mayer , MD Age: 82 Referring MD:  Date of Birth: 1929/02/20 Gender: Female Procedure:                Upper GI endoscopy Indications:              Epigastric abdominal pain, Chest pain (non cardiac) Medicines:                Propofol per Anesthesia, Monitored Anesthesia Care Procedure:                Pre-Anesthesia Assessment:                           - Prior to the procedure, a History and Physical                            was performed, and patient medications and                            allergies were reviewed. The patient's tolerance of                            previous anesthesia was also reviewed. The risks                            and benefits of the procedure and the sedation                            options and risks were discussed with the patient.                            All questions were answered, and informed consent                            was obtained. Prior Anticoagulants: The patient                            last took Eliquis (apixaban) 2 days prior to the                            procedure. ASA Grade Assessment: III - A patient                            with severe systemic disease. After reviewing the                            risks and benefits, the patient was deemed in                            satisfactory condition to undergo the procedure.                           After obtaining informed consent, the endoscope was  passed under direct vision. Throughout the                            procedure, the patient's blood pressure, pulse, and                            oxygen saturations were monitored continuously. The                            Model GIF-HQ190 [id number]) scope was introduced                            through the mouth, and advanced to the second part          of duodenum. The upper GI endoscopy was                            accomplished without difficulty. The patient                            tolerated the procedure well. Scope In: Scope Out: Findings:      The esophagus was normal.      The stomach was normal.      The examined duodenum was normal.      The cardia and gastric fundus were normal on retroflexion. Complications:            No immediate complications. Estimated Blood Loss:     Estimated blood loss: none. Impression:               - Normal esophagus.                           - Normal stomach.                           - Normal examined duodenum.                           - No specimens collected. Recommendation:           - Patient has a contact number available for                            emergencies. The signs and symptoms of potential                            delayed complications were discussed with the                            patient. Return to normal activities tomorrow.                            Written discharge instructions were provided to the                            patient.                           -  Resume previous diet.                           - Continue present medications.                           - Resume Eliquis (apixaban) at prior dose today.                           - Omeprazole is helping so will continue that                            indefinitely. Procedure Code(s):        --- Professional ---                           [phone], Esophagogastroduodenoscopy, flexible,                            transoral; diagnostic, including collection of                            specimen(s) by brushing or washing, when performed                            (separate procedure) CPT copyright 2016 American Medical Association. All rights reserved. Gatha Mayer, MD 09/13/2015 10:52:50 AM This report has been signed electronically. Number of Addenda: 0 CC Letter to:             Dione Housekeeper, Rozann Lesches M.D. , MD Referring MD:      Dione Housekeeper

## 2015-09-13 NOTE — Progress Notes (Signed)
Agree with Ms. Zehr's management.  Niko Jakel E. Forest Redwine, MD, FACG  

## 2015-09-13 NOTE — Progress Notes (Signed)
Report given to PACU, vss 

## 2015-09-14 ENCOUNTER — Telehealth: Payer: Self-pay | Admitting: Internal Medicine

## 2015-09-14 ENCOUNTER — Telehealth: Payer: Self-pay | Admitting: *Deleted

## 2015-09-14 MED ORDER — OMEPRAZOLE 20 MG PO CPDR: 20.0000 mg | DELAYED_RELEASE_CAPSULE | Freq: Every day | ORAL | Status: DC

## 2015-09-14 NOTE — Telephone Encounter (Signed)
  Follow up Call-  Call back number 09/13/2015  Post procedure Call Back phone  # [phone]  Phone comments [phone]  Permission to leave phone message Yes     Patient questions:  Do you have a fever, pain , or abdominal swelling? No. Pain Score  0 *  Have you tolerated food without any problems? Yes.    Have you been able to return to your normal activities? Yes.    Do you have any questions about your discharge instructions: Diet   No. Medications  No. Follow up visit  No.  Do you have questions or concerns about your Care? No.  Actions: * If pain score is 4 or above: No action needed, pain <4.

## 2015-09-14 NOTE — Telephone Encounter (Signed)
Informed [place] that rx re-sent as requested.

## 2015-09-17 ENCOUNTER — Ambulatory Visit (INDEPENDENT_AMBULATORY_CARE_PROVIDER_SITE_OTHER): Payer: Medicare Other | Admitting: Internal Medicine

## 2015-09-17 ENCOUNTER — Encounter: Payer: Self-pay | Admitting: Internal Medicine

## 2015-09-17 VITALS — BP 144/90 | HR 69 | Ht 66.0 in | Wt 175.0 lb

## 2015-09-17 DIAGNOSIS — I495 Sick sinus syndrome: Secondary | ICD-10-CM | POA: Diagnosis not present

## 2015-09-17 DIAGNOSIS — I481 Persistent atrial fibrillation: Secondary | ICD-10-CM | POA: Diagnosis not present

## 2015-09-17 DIAGNOSIS — I4819 Other persistent atrial fibrillation: Secondary | ICD-10-CM

## 2015-09-17 DIAGNOSIS — I10 Essential (primary) hypertension: Secondary | ICD-10-CM

## 2015-09-17 DIAGNOSIS — I480 Paroxysmal atrial fibrillation: Secondary | ICD-10-CM

## 2015-09-17 LAB — CUP PACEART INCLINIC DEVICE CHECK
Battery Remaining Longevity: 96 mo
Battery Voltage: 2.96 V
Brady Statistic RA Percent Paced: 55 %
Implantable Lead Implant Date: 20031028
Implantable Lead Implant Date: 20031028
Implantable Lead Location: 753859
Implantable Lead Location: 753860
Lead Channel Impedance Value: 350 Ohm
Lead Channel Pacing Threshold Amplitude: 1 V
Lead Channel Pacing Threshold Pulse Width: 0.5 ms
Lead Channel Sensing Intrinsic Amplitude: 0.4 mV
Lead Channel Sensing Intrinsic Amplitude: 12 mV
Lead Channel Setting Pacing Amplitude: 2.5 V
Lead Channel Setting Pacing Pulse Width: 0.5 ms
MDC IDC MSMT LEADCHNL RV IMPEDANCE VALUE: 450 Ohm
MDC IDC MSMT LEADCHNL RV PACING THRESHOLD AMPLITUDE: 1 V
MDC IDC MSMT LEADCHNL RV PACING THRESHOLD PULSEWIDTH: 0.5 ms
MDC IDC SESS DTM: 20170407154625
MDC IDC SET LEADCHNL RA PACING AMPLITUDE: 2 V
MDC IDC SET LEADCHNL RV SENSING SENSITIVITY: 2 mV
MDC IDC STAT BRADY RV PERCENT PACED: 83 %
Pulse Gen Serial Number: 7438567

## 2015-09-17 MED ORDER — NADOLOL 40 MG PO TABS: 60.0000 mg | ORAL_TABLET | Freq: Every day | ORAL | Status: DC

## 2015-09-17 NOTE — Progress Notes (Signed)
Electrophysiology Office Note   Date:  09/17/2015   ID:  FANTASY COMPITELLO, DOB 05-Jan-1929, MRN [id number]  PCP:  Sherrie Mustache, MD  Cardiologist:  Dr Domenic Polite Primary Electrophysiologist: Thompson Grayer, MD    CC: afib   History of Present Illness: [name] is a 82 y.o. female who presents today for electrophysiology evaluation.   She is doing well.  She remains very active and continues to do yard work.  Recent endoscopy noted.  Minimal symptoms with afib. Today, she denies symptoms of  chest pain, shortness of breath, orthopnea, PND, lower extremity edema, claudication, dizziness, presyncope, syncope, bleeding, or neurologic sequela. The patient is tolerating medications without difficulties and is otherwise without complaint today.    Past Medical History  Diagnosis Date  . Arthritis   . Glaucoma   . Diverticulosis   . Mixed hyperlipidemia   . Essential hypertension, benign   . Colon polyps   . Persistent atrial fibrillation ([place])   . Atrial flutter ([place])     s/p RFA 2003  . Sick sinus syndrome (HCC)     s/p PPM ([facility])  . Varicose veins   . IBS (irritable bowel syndrome)   . Osteoporosis   . Urinary tract infection   . GERD (gastroesophageal reflux disease)   . DVT, lower extremity ([place]) 1998 and 1999    Bilateral  . Pneumonia 2011  . Breast cancer ([place])     1980's - lumpectomy only   Past Surgical History  Procedure Laterality Date  . Cholecystectomy    . Pacemaker insertion  2003  . Colonoscopy w/ biopsies and polypectomy  03/2003, 05/2008, 02/20/2011    severe diverticulosis, tubulovillous adenoma polyp, internal and external hemorrhoids 2012: severe diverticulosis, 4 mm polyp, hemorrhoids  . Cataracts Bilateral   . Fractured wrist Left 2013    repair  . Total knee arthroplasty  12/20/2011    Procedure: TOTAL KNEE ARTHROPLASTY;  Surgeon: Tobi Bastos, MD;  Location: [facility];  Service: Orthopedics;  Laterality: Left;  . Pacemaker  generator change  06/18/12    [facility] [employer] DR RF, Dr Rayann Heman  . Cardiac catheterization  1999    Normal coronaries  . Permanent pacemaker generator change N/A 06/18/2012    Procedure: PERMANENT PACEMAKER GENERATOR CHANGE;  Surgeon: Thompson Grayer, MD;  Location: [facility] CATH LAB;  Service: Cardiovascular;  Laterality: N/A;  . Insert / replace / remove pacemaker    . Dilation and curettage of uterus    . Eye surgery    . Knee arthroscopy with medial menisectomy Right 10/21/2014    Procedure: RIGHT KNEE ARTHROSCOPY WITH MEDIAL MENISECTOM,lateral menisectomy,synovectomy suprapatellar pouch;  Surgeon: Latanya Maudlin, MD;  Location: [facility];  Service: Orthopedics;  Laterality: Right;  . Breast lumpectomy  1980's    Right breast   . Total knee arthroplasty Right 02/10/2015    Procedure: TOTAL RIGHT KNEE ARTHROPLASTY;  Surgeon: Latanya Maudlin, MD;  Location: [facility];  Service: Orthopedics;  Laterality: Right;     Current Outpatient Prescriptions  Medication Sig Dispense Refill  . acetaminophen (TYLENOL) 500 MG tablet Take 1,000 mg by mouth every 6 (six) hours as needed for mild pain.    Marland Kitchen amiodarone (PACERONE) 200 MG tablet Take 0.5 tablets (100 mg total) by mouth daily. 15 tablet 6  . apixaban (ELIQUIS) 5 MG TABS tablet Take 1 tablet (5 mg total) by mouth 2 (two) times daily. 56 tablet 0  . cyanocobalamin (,VITAMIN B-12,) 1000 MCG/ML injection Inject 1,000 mcg into  the muscle every 30 (thirty) days.      Marland Kitchen latanoprost (XALATAN) 0.005 % ophthalmic solution Place 1 drop into both eyes at bedtime.    . Multiple Vitamin (MULTIVITAMIN) tablet Take 1 tablet by mouth every morning.     . nadolol (CORGARD) 40 MG tablet Take 1.5 tablets (60 mg total) by mouth daily with breakfast. 45 tablet 6  . nitroGLYCERIN (NITROSTAT) 0.4 MG SL tablet Place 1 tablet (0.4 mg total) under the tongue every 5 (five) minutes as needed for chest pain. 25 tablet 1  . omeprazole (PRILOSEC) 20 MG capsule Take 1 capsule (20 mg total) by mouth  daily. 30 capsule 11   No current facility-administered medications for this visit.   Facility-Administered Medications Ordered in Other Visits  Medication Dose Route Frequency Provider Last Rate Last Dose  . bupivacaine liposome (EXPAREL) 1.3 % injection 266 mg  20 mL Infiltration Once [employer], PA-C        Allergies:   Iodinated diagnostic agents and Naproxen   Social History:  The patient  reports that she has never smoked. She has never used smokeless tobacco. She reports that she does not drink alcohol or use illicit drugs.   Family History:  The patient's family history includes Alzheimer's disease in her sister; Cancer in her father; Colon cancer in her brother, maternal grandfather, and other; Stroke in her mother.    ROS:  Please see the history of present illness.   All other systems are reviewed and negative.    PHYSICAL EXAM: VS:  BP 144/90 mmHg  Pulse 69  Ht 5\' 6"  (1.676 m)  Wt 175 lb (79.379 kg)  BMI 28.26 kg/m2  SpO2 98% , BMI Body mass index is 28.26 kg/(m^2). GEN: Well nourished, well developed, in no acute distress HEENT: normal Neck: no JVD, carotid bruits, or masses Cardiac: iRRR; no murmurs, rubs, or gallops,no edema  Respiratory:  clear to auscultation bilaterally, normal work of breathing GI: soft, nontender, nondistended, + BS MS: no deformity or atrophy Skin: warm and dry, device pocket is well healed Neuro:  Strength and sensation are intact Psych: euthymic mood, full affect  Device interrogation is reviewed today in detail.  See PaceArt for details.   Recent Labs: 08/19/2015: ALT 15; BUN 8; Creatinine, Ser 0.75; Hemoglobin 12.7; Platelets 171; Potassium 4.4; Sodium 141    Lipid Panel  No results found for: CHOL, TRIG, HDL, CHOLHDL, VLDL, LDLCALC, LDLDIRECT   Wt Readings from Last 3 Encounters:  09/17/15 175 lb (79.379 kg)  09/13/15 172 lb (78.019 kg)  09/01/15 172 lb 6 oz (78.189 kg)     ASSESSMENT AND PLAN:  1.  Sick sinus  syndrome Normal pacemaker function See Pace Art report No changes today  2. Persistent atrial fibrillation More frequent (AF burden 50%) chads2vasc score is at least 4 Continue eliquis long term Increase nadolol to 60mg  daily today for better rate control Will see Dr Domenic Polite in follow-up as scheduled next week May eventually stop amiodarone if she remains in afib.  3. HTN Elevated Increase nadolol as above   Current medicines are reviewed at length with the patient today.   The patient does not have concerns regarding her medicines.  The following changes were made today:  none  Labs/ tests ordered today include:  No orders of the defined types were placed in this encounter.    Follow-up: merlin, return to see me in 1 year  Signed, Thompson Grayer, MD  09/17/2015 12:08 PM  [place] [address] [phone] (office) [phone] (fax)

## 2015-09-17 NOTE — Patient Instructions (Signed)
Your physician has recommended you make the following change in your medication:  Increase nadolol to 60 mg daily. Please take 1&1/2 of your 40 mg tablets daily. Continue all other medications the same. Device check on 12/20/15. Your physician recommends that you schedule a follow-up appointment in: 1 year with Dr. Rayann Heman. Please schedule this appointment today.

## 2015-09-24 ENCOUNTER — Ambulatory Visit (INDEPENDENT_AMBULATORY_CARE_PROVIDER_SITE_OTHER): Payer: Medicare Other | Admitting: Cardiology

## 2015-09-24 ENCOUNTER — Encounter: Payer: Self-pay | Admitting: Cardiology

## 2015-09-24 VITALS — BP 143/85 | HR 70 | Ht 66.0 in | Wt 177.4 lb

## 2015-09-24 DIAGNOSIS — I4819 Other persistent atrial fibrillation: Secondary | ICD-10-CM

## 2015-09-24 DIAGNOSIS — I481 Persistent atrial fibrillation: Secondary | ICD-10-CM | POA: Diagnosis not present

## 2015-09-24 DIAGNOSIS — I495 Sick sinus syndrome: Secondary | ICD-10-CM | POA: Diagnosis not present

## 2015-09-24 NOTE — Progress Notes (Signed)
Cardiology Office Note  Date: 09/24/2015   ID: BRYSEIDA SOMOGYI, DOB [birth year], MRN [id number]  PCP: Sherrie Mustache, MD  Primary Cardiologist: Rozann Lesches, MD   Chief Complaint  Patient presents with  . Atrial Fibrillation    History of Present Illness:  [name] is an 82 y.o. female last seen in November 2016. I reviewed her interval records. She was seen in the ER in March with epigastric chest discomfort, improved with GI cocktail. Troponin I was negative. She states she has subsequent EGD that was fairly unremarkable. She has been on Prilosec, and reports significant improvement in symptoms which she takes 40 mg daily.  Recent follow-up with Dr. Rayann Heman noted. Nadolol dose was increased at that time. No changes in pacemaker function. Eventual discontinuation of amiodarone was discussed if atrial fibrillation persists. She still feels intermittent palpitations, heart rate is in the 70s today however.  Past Medical History  Diagnosis Date  . Arthritis   . Glaucoma   . Diverticulosis   . Mixed hyperlipidemia   . Essential hypertension, benign   . Colon polyps   . Persistent atrial fibrillation ([place])   . Atrial flutter ([place])     s/p RFA 2003  . Sick sinus syndrome ([facility])     s/p PPM ([facility])  . Varicose veins   . IBS (irritable bowel syndrome)   . Osteoporosis   . Urinary tract infection   . GERD (gastroesophageal reflux disease)   . DVT, lower extremity ([place]) 1998 and 1999    Bilateral  . Pneumonia 2011  . Breast cancer ([place])     1980's - lumpectomy only    Current Outpatient Prescriptions  Medication Sig Dispense Refill  . acetaminophen (TYLENOL) 500 MG tablet Take 1,000 mg by mouth every 6 (six) hours as needed for mild pain.    Marland Kitchen amiodarone (PACERONE) 200 MG tablet Take 0.5 tablets (100 mg total) by mouth daily. 15 tablet 6  . apixaban (ELIQUIS) 5 MG TABS tablet Take 1 tablet (5 mg total) by mouth 2 (two) times daily. 56 tablet 0  .  cyanocobalamin (,VITAMIN B-12,) 1000 MCG/ML injection Inject 1,000 mcg into the muscle every 30 (thirty) days.      Marland Kitchen latanoprost (XALATAN) 0.005 % ophthalmic solution Place 1 drop into both eyes at bedtime.    . Multiple Vitamin (MULTIVITAMIN) tablet Take 1 tablet by mouth every morning.     . nadolol (CORGARD) 40 MG tablet Take 1.5 tablets (60 mg total) by mouth daily with breakfast. 45 tablet 6  . nitroGLYCERIN (NITROSTAT) 0.4 MG SL tablet Place 1 tablet (0.4 mg total) under the tongue every 5 (five) minutes as needed for chest pain. 25 tablet 1  . omeprazole (PRILOSEC) 20 MG capsule Take 1 capsule (20 mg total) by mouth daily. 30 capsule 11   No current facility-administered medications for this visit.   Facility-Administered Medications Ordered in Other Visits  Medication Dose Route Frequency Provider Last Rate Last Dose  . bupivacaine liposome (EXPAREL) 1.3 % injection 266 mg  20 mL Infiltration Once [employer], PA-C       Allergies:  Iodinated diagnostic agents and Naproxen   Social History: The patient  reports that she has never smoked. She has never used smokeless tobacco. She reports that she does not drink alcohol or use illicit drugs.   ROS:  Please see the history of present illness. Otherwise, complete review of systems is positive for Intermittent reflux.  All other systems are reviewed  and negative.   Physical Exam: VS:  BP 143/85 mmHg  Pulse 70  Ht 5\' 6"  (1.676 m)  Wt 177 lb 6.4 oz (80.468 kg)  BMI 28.65 kg/m2  SpO2 98%, BMI Body mass index is 28.65 kg/(m^2).  Wt Readings from Last 3 Encounters:  09/24/15 177 lb 6.4 oz (80.468 kg)  09/17/15 175 lb (79.379 kg)  09/13/15 172 lb (78.019 kg)    Comfortable at rest.  HEENT: Conjunctiva and lids normal, oropharynx clear.  Neck: Supple, no carotid bruits, no elevated jugular venous pressure.  Lungs: Clear to auscultation, nonlabored.  Cardiac: Irregular rhythm, no significant systolic murmur or S3..  Abdomen:  Soft, bowel sounds present.  Extremities: No pitting. Varicose veins noted.  ECG: I personally reviewed the prior tracing from 08/19/2015 which showed a ventricular paced rhythm and intermittent sinus rhythm with IVCD.  Recent Labwork: 08/19/2015: ALT 15; AST 20; BUN 8; Creatinine, Ser 0.75; Hemoglobin 12.7; Platelets 171; Potassium 4.4; Sodium 141   Assessment and Plan:  1. Paroxysmal to persistent atrial fibrillation. Plan will be to continue current medical therapy. If device follow-up indicates persistent to chronic atrial fibrillation, will likely stop amiodarone. She continues on Eliquis for stroke prophylaxis.  2. Sick sinus syndrome status post [employer] pacemaker placement, followed by Dr. Rayann Heman.  Current medicines were reviewed with the patient today.  Disposition: FU with me in 6 months.   Signed, Satira Sark, MD, [facility] 09/24/2015 4:15 PM    [facility], [place], [place] [address] Phone: [phone]; Fax: [phone]

## 2015-09-24 NOTE — Patient Instructions (Signed)
Your physician wants you to follow-up in: 6 months with Dr. McDowell You will receive a reminder letter in the mail two months in advance. If you don't receive a letter, please call our office to schedule the follow-up appointment.  Your physician recommends that you continue on your current medications as directed. Please refer to the Current Medication list given to you today.  Thank you for choosing [facility]!!    

## 2015-10-07 ENCOUNTER — Encounter: Payer: Self-pay | Admitting: Internal Medicine

## 2015-10-28 ENCOUNTER — Emergency Department (HOSPITAL_COMMUNITY): Payer: Medicare Other

## 2015-10-28 ENCOUNTER — Emergency Department (HOSPITAL_COMMUNITY)
Admission: EM | Admit: 2015-10-28 | Discharge: 2015-10-28 | Disposition: A | Discharge status: Home / Self Care | Payer: Medicare Other | Attending: Emergency Medicine | Admitting: Emergency Medicine

## 2015-10-28 ENCOUNTER — Encounter (HOSPITAL_COMMUNITY): Payer: Self-pay

## 2015-10-28 DIAGNOSIS — K219 Gastro-esophageal reflux disease without esophagitis: Secondary | ICD-10-CM | POA: Insufficient documentation

## 2015-10-28 DIAGNOSIS — Z9889 Other specified postprocedural states: Secondary | ICD-10-CM | POA: Insufficient documentation

## 2015-10-28 DIAGNOSIS — Z853 Personal history of malignant neoplasm of breast: Secondary | ICD-10-CM | POA: Insufficient documentation

## 2015-10-28 DIAGNOSIS — Y9389 Activity, other specified: Secondary | ICD-10-CM | POA: Diagnosis not present

## 2015-10-28 DIAGNOSIS — Z86010 Personal history of colonic polyps: Secondary | ICD-10-CM | POA: Insufficient documentation

## 2015-10-28 DIAGNOSIS — I481 Persistent atrial fibrillation: Secondary | ICD-10-CM | POA: Insufficient documentation

## 2015-10-28 DIAGNOSIS — Y998 Other external cause status: Secondary | ICD-10-CM | POA: Diagnosis not present

## 2015-10-28 DIAGNOSIS — S8991XA Unspecified injury of right lower leg, initial encounter: Secondary | ICD-10-CM | POA: Insufficient documentation

## 2015-10-28 DIAGNOSIS — W1839XA Other fall on same level, initial encounter: Secondary | ICD-10-CM | POA: Diagnosis not present

## 2015-10-28 DIAGNOSIS — Z8701 Personal history of pneumonia (recurrent): Secondary | ICD-10-CM | POA: Diagnosis not present

## 2015-10-28 DIAGNOSIS — M810 Age-related osteoporosis without current pathological fracture: Secondary | ICD-10-CM | POA: Insufficient documentation

## 2015-10-28 DIAGNOSIS — M1990 Unspecified osteoarthritis, unspecified site: Secondary | ICD-10-CM | POA: Diagnosis not present

## 2015-10-28 DIAGNOSIS — Z79899 Other long term (current) drug therapy: Secondary | ICD-10-CM | POA: Diagnosis not present

## 2015-10-28 DIAGNOSIS — I10 Essential (primary) hypertension: Secondary | ICD-10-CM | POA: Insufficient documentation

## 2015-10-28 DIAGNOSIS — Y9289 Other specified places as the place of occurrence of the external cause: Secondary | ICD-10-CM | POA: Diagnosis not present

## 2015-10-28 DIAGNOSIS — E782 Mixed hyperlipidemia: Secondary | ICD-10-CM | POA: Insufficient documentation

## 2015-10-28 DIAGNOSIS — S79912A Unspecified injury of left hip, initial encounter: Secondary | ICD-10-CM | POA: Insufficient documentation

## 2015-10-28 DIAGNOSIS — S42212A Unspecified displaced fracture of surgical neck of left humerus, initial encounter for closed fracture: Secondary | ICD-10-CM | POA: Insufficient documentation

## 2015-10-28 DIAGNOSIS — Z950 Presence of cardiac pacemaker: Secondary | ICD-10-CM | POA: Insufficient documentation

## 2015-10-28 DIAGNOSIS — H409 Unspecified glaucoma: Secondary | ICD-10-CM | POA: Diagnosis not present

## 2015-10-28 DIAGNOSIS — S42302A Unspecified fracture of shaft of humerus, left arm, initial encounter for closed fracture: Secondary | ICD-10-CM

## 2015-10-28 DIAGNOSIS — I4892 Unspecified atrial flutter: Secondary | ICD-10-CM | POA: Insufficient documentation

## 2015-10-28 DIAGNOSIS — W1800XA Striking against unspecified object with subsequent fall, initial encounter: Secondary | ICD-10-CM

## 2015-10-28 DIAGNOSIS — Z86718 Personal history of other venous thrombosis and embolism: Secondary | ICD-10-CM | POA: Insufficient documentation

## 2015-10-28 DIAGNOSIS — S4992XA Unspecified injury of left shoulder and upper arm, initial encounter: Secondary | ICD-10-CM | POA: Diagnosis present

## 2015-10-28 MED ORDER — OXYCODONE-ACETAMINOPHEN 5-325 MG PO TABS: 1.0000 | ORAL_TABLET | Freq: Four times a day (QID) | ORAL | Status: DC | PRN

## 2015-10-28 MED ORDER — FENTANYL CITRATE (PF) 100 MCG/2ML IJ SOLN
25.0000 ug | INTRAMUSCULAR | Status: DC | PRN
Start: 2015-10-28 — End: 2015-10-28
  Administered 2015-10-28: 25 ug via INTRAVENOUS
  Filled 2015-10-28: qty 2

## 2015-10-28 MED ORDER — FENTANYL CITRATE (PF) 100 MCG/2ML IJ SOLN
25.0000 ug | Freq: Once | INTRAMUSCULAR | Status: AC
  Administered 2015-10-28: 25 ug via INTRAVENOUS
  Filled 2015-10-28: qty 2

## 2015-10-28 NOTE — Progress Notes (Signed)
Orthopedic Tech Progress Note Patient Details:  [name] 05/16/1929 [id number]  Ortho Devices Type of Ortho Device: Shoulder immobilizer Ortho Device/Splint Location: LUE Ortho Device/Splint Interventions: Ordered, Application   Braulio Bosch 10/28/2015, 6:03 PM

## 2015-10-28 NOTE — ED Notes (Signed)
Patient left at this time with all belongings. 

## 2015-10-28 NOTE — ED Notes (Signed)
Ortho tech aware of need for shoulder sling/immobilizer.

## 2015-10-28 NOTE — ED Notes (Signed)
Patient transported to X-ray 

## 2015-10-28 NOTE — ED Notes (Signed)
Assisted patient with ambulation to the bathroom 

## 2015-10-28 NOTE — Discharge Instructions (Signed)
You have fractured your left humerus (upper arm bone). Use the sling for immobilization and for comfort. Make an appointment with orthopedics in 1 week to determine if he needs surgery. Take Motrin and Tylenol for pain. Use the prescribed Percocet if you're having persistence of pain.  Return to the emergency department should he have worsening pain, numbness or tingling in your arm, or changes in color or temperature of her arm.

## 2015-10-28 NOTE — ED Notes (Signed)
MD at bedside. 

## 2015-10-28 NOTE — ED Notes (Signed)
Per EMS - pt fell and landed on left side. Obvious deformity to left shoulder. Pt denies hitting head/LOC/dizziness prior to fall. PMS intact. Given 4mg  morphine w/ some relief of pain.

## 2015-10-28 NOTE — ED Provider Notes (Signed)
CSN: [id number]     Arrival date & time 10/28/15  1509 History   First MD Initiated Contact with Patient 10/28/15 1517     Chief Complaint  Patient presents with  . Shoulder Injury     (Consider location/radiation/quality/duration/timing/severity/associated sxs/prior Treatment) Patient is a 82 y.o. female presenting with general illness. The history is provided by the patient.  Illness Location:  Fall Severity:  Mild Onset quality:  Sudden Duration:  1 hour Timing:  Constant Progression:  Unchanged Chronicity:  New Context:  Patient reports that she was attempting to turn around, mechanical fall. Fell with her left side onto a car, and then fell to the ground. Immediate pain in her left shoulder. Also endorses right knee and left hip pain. Did not hit her head, did not lose conscious. Patient is on Eliquis. Associated symptoms: no abdominal pain, no chest pain, no fever, no headaches, no nausea, no shortness of breath and no vomiting     Past Medical History  Diagnosis Date  . Arthritis   . Glaucoma   . Diverticulosis   . Mixed hyperlipidemia   . Essential hypertension, benign   . Colon polyps   . Persistent atrial fibrillation ([place])   . Atrial flutter ([place])     s/p RFA 2003  . Sick sinus syndrome ([facility])     s/p PPM ([facility])  . Varicose veins   . IBS (irritable bowel syndrome)   . Osteoporosis   . Urinary tract infection   . GERD (gastroesophageal reflux disease)   . DVT, lower extremity ([place]) 1998 and 1999    Bilateral  . Pneumonia 2011  . Breast cancer ([place])     1980's - lumpectomy only   Past Surgical History  Procedure Laterality Date  . Cholecystectomy    . Pacemaker insertion  2003  . Colonoscopy w/ biopsies and polypectomy  03/2003, 05/2008, 02/20/2011    severe diverticulosis, tubulovillous adenoma polyp, internal and external hemorrhoids 2012: severe diverticulosis, 4 mm polyp, hemorrhoids  . Cataracts Bilateral   . Fractured wrist Left 2013    repair  .  Total knee arthroplasty  12/20/2011    Procedure: TOTAL KNEE ARTHROPLASTY;  Surgeon: Tobi Bastos, MD;  Location: [facility];  Service: Orthopedics;  Laterality: Left;  . Pacemaker generator change  06/18/12    [facility] DR RF, Dr Rayann Heman  . Cardiac catheterization  1999    Normal coronaries  . Permanent pacemaker generator change N/A 06/18/2012    Procedure: PERMANENT PACEMAKER GENERATOR CHANGE;  Surgeon: Thompson Grayer, MD;  Location: [facility] CATH LAB;  Service: Cardiovascular;  Laterality: N/A;  . Insert / replace / remove pacemaker    . Dilation and curettage of uterus    . Eye surgery    . Knee arthroscopy with medial menisectomy Right 10/21/2014    Procedure: RIGHT KNEE ARTHROSCOPY WITH MEDIAL MENISECTOM,lateral menisectomy,synovectomy suprapatellar pouch;  Surgeon: Latanya Maudlin, MD;  Location: [facility];  Service: Orthopedics;  Laterality: Right;  . Breast lumpectomy  1980's    Right breast   . Total knee arthroplasty Right 02/10/2015    Procedure: TOTAL RIGHT KNEE ARTHROPLASTY;  Surgeon: Latanya Maudlin, MD;  Location: [facility];  Service: Orthopedics;  Laterality: Right;   Family History  Problem Relation Age of Onset  . Colon cancer Brother   . Colon cancer Other     Grandfather  . Cancer Father   . Stroke Mother   . Heart attack      CHF grandmother  .  Alzheimer's disease Sister   . Colon cancer Maternal Grandfather    Social History  Substance Use Topics  . Smoking status: Never Smoker   . Smokeless tobacco: Never Used  . Alcohol Use: No   OB History    No data available     Review of Systems  Constitutional: Negative for fever and chills.  Respiratory: Negative for shortness of breath.   Cardiovascular: Negative for chest pain.  Gastrointestinal: Negative for nausea, vomiting and abdominal pain.  Musculoskeletal: Negative for back pain and neck pain.  Skin: Negative for wound.  Neurological: Negative for syncope, speech difficulty, weakness, light-headedness, numbness and  headaches.  All other systems reviewed and are negative.     Allergies  Iodinated diagnostic agents and Naproxen  Home Medications   Prior to Admission medications   Medication Sig Start Date End Date Taking? Authorizing Provider  acetaminophen (TYLENOL) 500 MG tablet Take 1,000 mg by mouth every 6 (six) hours as needed for mild pain.   Yes Historical Provider, MD  amiodarone (PACERONE) 200 MG tablet Take 0.5 tablets (100 mg total) by mouth daily. 05/31/15  Yes Satira Sark, MD  apixaban (ELIQUIS) 5 MG TABS tablet Take 1 tablet (5 mg total) by mouth 2 (two) times daily. 08/13/15  Yes Satira Sark, MD  calcium-vitamin D (OSCAL WITH D) 500-200 MG-UNIT tablet Take 2 tablets by mouth daily with breakfast.   Yes Historical Provider, MD  cyanocobalamin (,VITAMIN B-12,) 1000 MCG/ML injection Inject 1,000 mcg into the muscle every 30 (thirty) days.     Yes Historical Provider, MD  latanoprost (XALATAN) 0.005 % ophthalmic solution Place 1 drop into both eyes at bedtime.   Yes Historical Provider, MD  Multiple Vitamin (MULTIVITAMIN) tablet Take 1 tablet by mouth every morning.    Yes Historical Provider, MD  nadolol (CORGARD) 40 MG tablet Take 1.5 tablets (60 mg total) by mouth daily with breakfast. Patient taking differently: Take 40 mg by mouth daily with breakfast.  09/17/15  Yes Thompson Grayer, MD  nitroGLYCERIN (NITROSTAT) 0.4 MG SL tablet Place 1 tablet (0.4 mg total) under the tongue every 5 (five) minutes as needed for chest pain. 04/28/15  Yes Satira Sark, MD  omeprazole (PRILOSEC) 20 MG capsule Take 1 capsule (20 mg total) by mouth daily. 09/14/15  Yes Gatha Mayer, MD   BP 150/71 mmHg  Pulse 59  Temp(Src) 98.2 F (36.8 C) (Oral)  Resp 14  SpO2 98% Physical Exam  Constitutional: She is oriented to person, place, and time. She appears well-developed and well-nourished. No distress.  HENT:  Head: Normocephalic and atraumatic.  Mouth/Throat: Mucous membranes are not dry.   Eyes: EOM are normal. Pupils are equal, round, and reactive to light.  Neck: Normal range of motion. No spinous process tenderness present.  Cardiovascular: Normal rate and regular rhythm.   Pulmonary/Chest: No tachypnea. No respiratory distress. She has no decreased breath sounds. She exhibits no bony tenderness.  Abdominal: Soft. Normal appearance. There is no tenderness. There is no guarding.  Musculoskeletal:       Left shoulder: She exhibits decreased range of motion and tenderness. She exhibits no laceration.       Left elbow: Normal.       Left wrist: Normal.       Left hip: She exhibits tenderness. She exhibits normal range of motion, normal strength, no deformity and no laceration.       Right knee: She exhibits normal range of motion, no deformity and  no laceration. Tenderness found.       Cervical back: Normal. She exhibits no tenderness.       Thoracic back: Normal. She exhibits no tenderness.       Lumbar back: Normal. She exhibits no tenderness.       Right lower leg: She exhibits no edema.       Left lower leg: She exhibits no edema.  Neurological: She is alert and oriented to person, place, and time. She has normal strength. No cranial nerve deficit or sensory deficit. GCS eye subscore is 4. GCS verbal subscore is 5. GCS motor subscore is 6.  Skin: Skin is warm and dry.    ED Course  Procedures (including critical care time) Labs Review Labs Reviewed - No data to display  Imaging Review Dg Elbow Complete Left  10/28/2015  CLINICAL DATA:  82 year old female fell outside against car today. Pain. Initial encounter. EXAM: LEFT ELBOW - COMPLETE 3+ VIEW COMPARISON:  None. FINDINGS: No evidence of joint effusion. Joint spaces and alignment within normal limits for age. Mild osteopenia. No acute osseous abnormality identified. Evidence of venous varicosities in the upper extremity soft tissues. IMPRESSION: No acute fracture or dislocation identified about the left elbow.  Electronically Signed   By: Genevie Ann M.D.   On: 10/28/2015 17:14   Ct Head Wo Contrast  10/28/2015  CLINICAL DATA:  [place] today, LEFT side pain, history hypertension, breast cancer EXAM: CT HEAD WITHOUT CONTRAST TECHNIQUE: Contiguous axial images were obtained from the base of the skull through the vertex without intravenous contrast. COMPARISON:  12/07/2008; interval exam on the time line from 10/20/2013 does not load for comparison. FINDINGS: Minimal atrophy. Normal ventricular morphology. No midline shift or mass effect. Otherwise normal appearance of brain parenchyma. No intracranial hemorrhage, mass lesion or evidence acute infarction. No extra-axial fluid collection. Bones demineralized. Atherosclerotic calcifications BILATERAL carotid siphons. Visualized paranasal sinuses and mastoid air cells clear. No acute osseous findings. IMPRESSION: No acute intracranial abnormalities. Electronically Signed   By: Lavonia Dana M.D.   On: 10/28/2015 17:33   Dg Shoulder Left  10/28/2015  CLINICAL DATA:  Status post fall.  Left shoulder pain. EXAM: LEFT SHOULDER - 2+ VIEW COMPARISON:  None. FINDINGS: There is a minimally displaced, mildly comminuted fracture of the surgical neck of the left proximal humerus. There is no evidence of arthropathy or other focal bone abnormality. Soft tissues are unremarkable. IMPRESSION: Minimally displaced, mildly comminuted fracture of the surgical neck of the left proximal humerus. Electronically Signed   By: Kathreen Devoid   On: 10/28/2015 17:14   Dg Knee Complete 4 Views Right  10/28/2015  CLINICAL DATA:  82 year old female who fell against car several hours ago. Pain. Initial encounter. EXAM: RIGHT KNEE - COMPLETE 4+ VIEW COMPARISON:  CT arthrogram of the right knee 09/25/2014 FINDINGS: Interval right total knee arthroplasty. No definite joint effusion. Hardware appears intact and normally aligned. Postoperative and degenerative changes to the patella. No acute osseous abnormality  identified. IMPRESSION: Status post right knee arthroplasty. No acute osseous abnormality identified about the right knee. Electronically Signed   By: Genevie Ann M.D.   On: 10/28/2015 17:12   Dg Hip Unilat With Pelvis 2-3 Views Left  10/28/2015  CLINICAL DATA:  Fall.  Painful left shoulder left hip an right knee EXAM: DG HIP (WITH OR WITHOUT PELVIS) 2-3V LEFT COMPARISON:  None. FINDINGS: The bones appear diffusely osteopenic. Both hips appear located. There is no acute fracture or subluxation identified. Mild bilateral  hip osteoarthritis identified. IMPRESSION: 1. No acute findings. 2. If there is high clinical suspicion for occult fracture or the patient refuses to weightbear, consider further evaluation with MRI. Although CT is expeditious, evidence is lacking regarding accuracy of CT over plain film radiography. Electronically Signed   By: Kerby Moors M.D.   On: 10/28/2015 17:13   I have personally reviewed and evaluated these images and lab results as part of my medical decision-making.   EKG Interpretation   Date/Time:  Thursday Oct 28 2015 15:33:22 EDT Ventricular Rate:  60 PR Interval:  102 QRS Duration: 175 QT Interval:  491 QTC Calculation: 491 R Axis:   -51 Text Interpretation:  Atrial-ventricular dual-paced rhythm No further  analysis attempted due to paced rhythm Confirmed by Ashok Cordia  MD, Lennette Bihari  ([id number]) on 10/28/2015 3:36:56 PM      MDM   Final diagnoses:  Left humeral fracture, closed, initial encounter    82 year old female presenting after a mechanical fall. Patient did not have any prodromal symptoms. Denies any chest pain or shortness of breath. EKG did not have any new interval abnormalities or ischemic changes. Doubt ACS or arrhythmia that could've caused the fall. Denies any headache or vision changes are to the fall. No focal neurological deficits on exam. Doubt CVA that could've caused the fall. On arrival here the patient is well-appearing in no acute distress. Vital  signs normal and stable, physical exam as above.  Multiple plain films of the patient. Left humerus does have evidence of a proximal humerus fracture. No other acute injuries found. No evidence of fracture of her right knee or left hip or pelvis. CT of her head showed no evidence of intracranial hemorrhage.  Placed the patient in a sling immobilizer. Able to ambulate the patient throughout the emergency department without issue.  Provided the patient information to contact orthopedics. Encouraged him to make an appointment within the next 1 week; if this is not possible told him to return to the emergency department. Given a prescription for Percocet; told her to use Motrin or Tylenol for pain, Percocet for breakthrough pain.  Return precautions provided. Encouraged him to follow up with her primary care doctor as well. Patient discharged in stable condition.  Maryan Puls, MD 10/28/15 1929  Lajean Saver, MD 10/28/15 [phone]

## 2015-11-09 DIAGNOSIS — S4292XA Fracture of left shoulder girdle, part unspecified, initial encounter for closed fracture: Secondary | ICD-10-CM | POA: Insufficient documentation

## 2015-12-13 ENCOUNTER — Other Ambulatory Visit: Payer: Self-pay | Admitting: *Deleted

## 2015-12-13 MED ORDER — AMIODARONE HCL 200 MG PO TABS: 100.0000 mg | ORAL_TABLET | Freq: Every day | ORAL | Status: DC

## 2015-12-20 ENCOUNTER — Telehealth: Payer: Self-pay | Admitting: Cardiology

## 2015-12-20 ENCOUNTER — Ambulatory Visit (INDEPENDENT_AMBULATORY_CARE_PROVIDER_SITE_OTHER): Payer: Medicare Other | Admitting: *Deleted

## 2015-12-20 ENCOUNTER — Encounter: Payer: Medicare Other | Admitting: *Deleted

## 2015-12-20 DIAGNOSIS — I495 Sick sinus syndrome: Secondary | ICD-10-CM

## 2015-12-20 NOTE — Telephone Encounter (Signed)
LMOVM reminding pt to send remote transmission.   

## 2015-12-24 ENCOUNTER — Encounter: Payer: Self-pay | Admitting: Cardiology

## 2016-01-05 NOTE — Progress Notes (Signed)
Remote pacemaker transmission.   

## 2016-01-10 LAB — CUP PACEART REMOTE DEVICE CHECK
Battery Remaining Longevity: 95 mo
Battery Remaining Percentage: 95.5 %
Battery Voltage: 2.96 V
Brady Statistic AP VP Percent: 98 %
Brady Statistic AP VS Percent: 1.5 %
Brady Statistic AS VP Percent: 1 %
Brady Statistic AS VS Percent: 1 %
Brady Statistic RA Percent Paced: 66 %
Brady Statistic RV Percent Paced: 96 %
Date Time Interrogation Session: 20170710173153
Implantable Lead Implant Date: 20031028
Implantable Lead Implant Date: 20031028
Implantable Lead Location: 753859
Implantable Lead Location: 753860
Lead Channel Impedance Value: 360 Ohm
Lead Channel Impedance Value: 440 Ohm
Lead Channel Sensing Intrinsic Amplitude: 0.2 mV
Lead Channel Sensing Intrinsic Amplitude: 10 mV
Lead Channel Setting Pacing Amplitude: 2 V
Lead Channel Setting Pacing Amplitude: 2.5 V
Lead Channel Setting Pacing Pulse Width: 0.5 ms
Lead Channel Setting Sensing Sensitivity: 2 mV
Pulse Gen Model: 2210
Pulse Gen Serial Number: 7438567

## 2016-01-13 ENCOUNTER — Other Ambulatory Visit: Payer: Self-pay | Admitting: *Deleted

## 2016-01-13 MED ORDER — APIXABAN 5 MG PO TABS: 5.0000 mg | ORAL_TABLET | Freq: Two times a day (BID) | ORAL | 6 refills | Status: DC

## 2016-01-27 DIAGNOSIS — R269 Unspecified abnormalities of gait and mobility: Secondary | ICD-10-CM | POA: Insufficient documentation

## 2016-01-27 DIAGNOSIS — I2510 Atherosclerotic heart disease of native coronary artery without angina pectoris: Secondary | ICD-10-CM | POA: Insufficient documentation

## 2016-03-21 NOTE — Progress Notes (Signed)
Cardiology Office Note  Date: 03/22/2016   ID: [name], DOB 1928-06-13, MRN [id number]  PCP: Sherrie Mustache, MD  Primary Cardiologist: Rozann Lesches, MD   Chief Complaint  Patient presents with  . Atrial Fibrillation    History of Present Illness: [name] is an 82 y.o. female last seen in April. She presents for a routine follow-up visit. She is not reporting any progressive palpitations or chest pain. She had a fall back in May with injury to right shoulder and olecranon, back to baseline at this point. She has had no subsequent falls.  She continues to follow with Dr. Rayann Heman in the device clinic, [place] pacemaker in place for sick sinus syndrome. Most recent device interrogation indicated 38% AT/AF episodes. She remains on Eliquis for stroke prophylaxis, no bleeding problems.  I reviewed her most recent lab work per Dr. Edrick Oh. Renal function, LFTs, and hemoglobin were normal range.  Past Medical History:  Diagnosis Date  . Arthritis   . Atrial flutter ([place])    s/p RFA 2003  . Breast cancer ([place])    1980's - lumpectomy only  . Colon polyps   . Diverticulosis   . DVT, lower extremity ([place]) 1998 and 1999   Bilateral  . Essential hypertension, benign   . GERD (gastroesophageal reflux disease)   . Glaucoma   . IBS (irritable bowel syndrome)   . Mixed hyperlipidemia   . Osteoporosis   . Persistent atrial fibrillation ([place])   . Pneumonia 2011  . Sick sinus syndrome (HCC)    s/p PPM ([facility])  . Urinary tract infection   . Varicose veins     Current Outpatient Prescriptions  Medication Sig Dispense Refill  . acetaminophen (TYLENOL) 500 MG tablet Take 1,000 mg by mouth every 6 (six) hours as needed for mild pain.    Marland Kitchen amiodarone (PACERONE) 200 MG tablet Take 0.5 tablets (100 mg total) by mouth daily. 15 tablet 6  . apixaban (ELIQUIS) 5 MG TABS tablet Take 1 tablet (5 mg total) by mouth 2 (two) times daily. 60 tablet 6  . cyanocobalamin  (,VITAMIN B-12,) 1000 MCG/ML injection Inject 1,000 mcg into the muscle every 30 (thirty) days.      Marland Kitchen latanoprost (XALATAN) 0.005 % ophthalmic solution Place 1 drop into both eyes at bedtime.    . Multiple Vitamin (MULTIVITAMIN) tablet Take 1 tablet by mouth every morning.     . nadolol (CORGARD) 40 MG tablet Take 40 mg by mouth daily.    . nitroGLYCERIN (NITROSTAT) 0.4 MG SL tablet Place 1 tablet (0.4 mg total) under the tongue every 5 (five) minutes as needed for chest pain. 25 tablet 1  . omeprazole (PRILOSEC) 20 MG capsule Take 1 capsule (20 mg total) by mouth daily. 30 capsule 11  . calcium-vitamin D (OSCAL WITH D) 500-200 MG-UNIT tablet Take 2 tablets by mouth daily with breakfast.     No current facility-administered medications for this visit.    Facility-Administered Medications Ordered in Other Visits  Medication Dose Route Frequency Provider Last Rate Last Dose  . bupivacaine liposome (EXPAREL) 1.3 % injection 266 mg  20 mL Infiltration Once [employer], PA-C       Allergies:  Iodinated diagnostic agents and Naproxen   Social History: The patient  reports that she has never smoked. She has never used smokeless tobacco. She reports that she does not drink alcohol or use drugs.   ROS:  Please see the history of present illness. Otherwise,  complete review of systems is positive for none.  All other systems are reviewed and negative.   Physical Exam: VS:  BP 126/84   Pulse 71   Ht 5\' 6"  (1.676 m)   Wt 175 lb (79.4 kg)   BMI 28.25 kg/m , BMI Body mass index is 28.25 kg/m.  Wt Readings from Last 3 Encounters:  03/22/16 175 lb (79.4 kg)  09/24/15 177 lb 6.4 oz (80.5 kg)  09/17/15 175 lb (79.4 kg)    Comfortable at rest.  HEENT: Conjunctiva and lids normal, oropharynx clear.  Neck: Supple, no carotid bruits, no elevated jugular venous pressure.  Lungs: Clear to auscultation, nonlabored.  Cardiac: Irregular rhythm, no significant systolic murmur or S3..  Abdomen:  Soft, bowel sounds present.  Extremities: No pitting. Varicose veins noted.  ECG: I personally reviewed the tracing from 10/28/2015 which showed dual-chamber pacing.  Recent Labwork: 08/19/2015: ALT 15; AST 20; BUN 8; Creatinine, Ser 0.75; Hemoglobin 12.7; Platelets 171; Potassium 4.4; Sodium 141  June 2017: BUN 13, creatinine 0.8, potassium 4.5, AST 13, ALT 12, hemoglobin 12.3, platelets 182, cholesterol 179, triglycerides 82, HDL 61, LDL 102  Other Studies Reviewed Today:  Lexiscan Myoview 08/01/2012: No perfusion evidence of scar or ischemia with LVEF 63%.  Assessment and Plan:  1. Paroxysmal atrial fibrillation. Looks to have a reasonable rhythm suppression on amiodarone based on her device interrogations so we will continue present regimen. No spontaneous bleeding problems on Eliquis. Recent lab work reviewed.  2.  Sick sinus syndrome status post St. Jude pacemaker placement, continues to follow with Dr. Rayann Heman.  Current medicines were reviewed with the patient today.  Disposition: Follow-up with me in 6 months.  Signed, Satira Sark, MD, [facility] 03/22/2016 11:16 AM    [facility] [place], [place], [place] [address] Phone: [phone]; Fax: [phone]

## 2016-03-22 ENCOUNTER — Encounter: Payer: Self-pay | Admitting: Cardiology

## 2016-03-22 ENCOUNTER — Ambulatory Visit (INDEPENDENT_AMBULATORY_CARE_PROVIDER_SITE_OTHER): Payer: Medicare Other | Admitting: Cardiology

## 2016-03-22 VITALS — BP 126/84 | HR 71 | Ht 66.0 in | Wt 175.0 lb

## 2016-03-22 DIAGNOSIS — I480 Paroxysmal atrial fibrillation: Secondary | ICD-10-CM

## 2016-03-22 DIAGNOSIS — I495 Sick sinus syndrome: Secondary | ICD-10-CM | POA: Diagnosis not present

## 2016-03-22 NOTE — Patient Instructions (Signed)

## 2016-04-05 ENCOUNTER — Encounter: Payer: Medicare Other | Admitting: *Deleted

## 2016-04-05 ENCOUNTER — Telehealth: Payer: Self-pay | Admitting: Cardiology

## 2016-04-05 NOTE — Telephone Encounter (Signed)
LMOVM reminding pt to send remote transmission.   

## 2016-04-06 ENCOUNTER — Encounter: Payer: Self-pay | Admitting: Cardiology

## 2016-04-06 NOTE — Progress Notes (Signed)
Letter  

## 2016-04-17 ENCOUNTER — Ambulatory Visit (INDEPENDENT_AMBULATORY_CARE_PROVIDER_SITE_OTHER): Payer: Medicare Other | Admitting: *Deleted

## 2016-04-17 ENCOUNTER — Telehealth: Payer: Self-pay | Admitting: Internal Medicine

## 2016-04-17 DIAGNOSIS — I495 Sick sinus syndrome: Secondary | ICD-10-CM

## 2016-04-17 NOTE — Telephone Encounter (Signed)
New message ° ° ° ° ° °Did you get her remote transmission? °

## 2016-04-17 NOTE — Telephone Encounter (Signed)
Spoke w/ pt and informed her that we have not received her remote transmission. Instructed her how to send a remote transmission. Pt verbalized understanding.

## 2016-04-18 NOTE — Progress Notes (Signed)
Remote pacemaker transmission.   

## 2016-04-19 ENCOUNTER — Encounter: Payer: Self-pay | Admitting: Cardiology

## 2016-04-30 LAB — CUP PACEART REMOTE DEVICE CHECK
Battery Remaining Longevity: 100 mo
Battery Remaining Percentage: 95.5 %
Battery Voltage: 2.96 V
Brady Statistic RA Percent Paced: 35 %
Brady Statistic RV Percent Paced: 95 %
Date Time Interrogation Session: 20171106212614
Implantable Lead Implant Date: 20031028
Implantable Lead Location: 753859
Implantable Lead Location: 753860
Implantable Pulse Generator Implant Date: 20140107
Lead Channel Impedance Value: 350 Ohm
Lead Channel Pacing Threshold Amplitude: 1 V
Lead Channel Pacing Threshold Pulse Width: 0.5 ms
Lead Channel Sensing Intrinsic Amplitude: 0.2 mV
Lead Channel Setting Pacing Pulse Width: 0.5 ms
Lead Channel Setting Sensing Sensitivity: 2 mV
MDC IDC LEAD IMPLANT DT: 20031028
MDC IDC MSMT LEADCHNL RA PACING THRESHOLD AMPLITUDE: 1 V
MDC IDC MSMT LEADCHNL RV IMPEDANCE VALUE: 460 Ohm
MDC IDC MSMT LEADCHNL RV PACING THRESHOLD PULSEWIDTH: 0.5 ms
MDC IDC MSMT LEADCHNL RV SENSING INTR AMPL: 10.9 mV
MDC IDC SET LEADCHNL RA PACING AMPLITUDE: 2 V
MDC IDC SET LEADCHNL RV PACING AMPLITUDE: 2.5 V
MDC IDC STAT BRADY AP VP PERCENT: 98 %
MDC IDC STAT BRADY AP VS PERCENT: 1.5 %
MDC IDC STAT BRADY AS VP PERCENT: 1 %
MDC IDC STAT BRADY AS VS PERCENT: 1 %
Pulse Gen Model: 2210
Pulse Gen Serial Number: 7438567

## 2016-07-07 ENCOUNTER — Other Ambulatory Visit: Payer: Self-pay

## 2016-07-07 MED ORDER — AMIODARONE HCL 200 MG PO TABS: 100.0000 mg | ORAL_TABLET | Freq: Every day | ORAL | 6 refills | Status: DC

## 2016-07-07 MED ORDER — APIXABAN 5 MG PO TABS: 5.0000 mg | ORAL_TABLET | Freq: Two times a day (BID) | ORAL | 6 refills | Status: DC

## 2016-08-15 ENCOUNTER — Other Ambulatory Visit: Payer: Self-pay | Admitting: Internal Medicine

## 2016-08-27 IMAGING — RF DG FLUORO GUIDE NDL PLC/BX
2 series · 2 of 2 positions shown · non-contrast
Comparison: none

CLINICAL DATA: Right knee pain. Patient not a candidate for MRI due
to pacemaker.

[Series 1: run · 1 of 1 slices shown (1 of 2)]
[im 1/1]
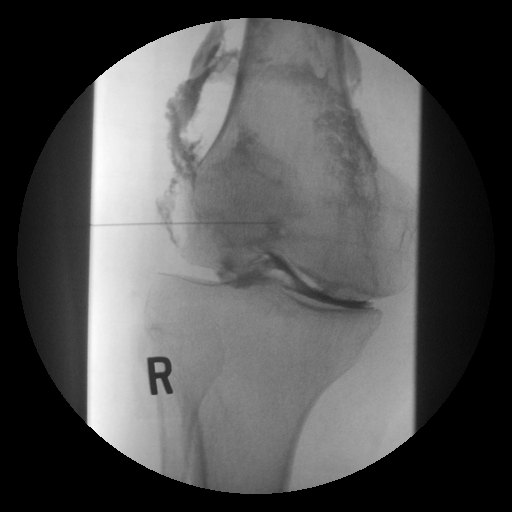

[Series 2: run · 1 of 1 slices shown (2 of 2)]
[im 1/1]
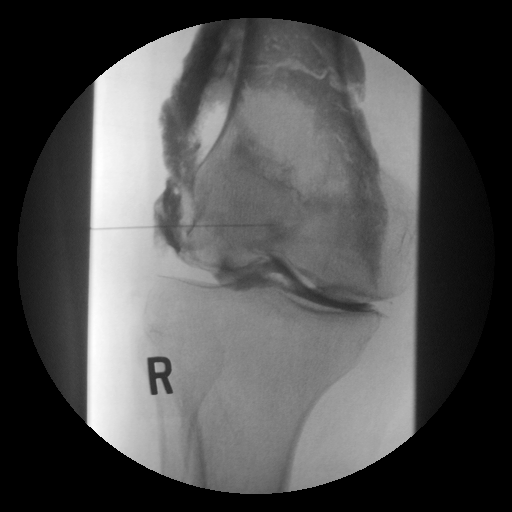

[2 of 2 positions shown; findings below may reference images not displayed]

EXAM:
Right knee  INJECTION UNDER FLUOROSCOPY

FLUOROSCOPY TIME:  Radiation Exposure Index (as provided by the
fluoroscopic device):

If the device does not provide the exposure index:

Fluoroscopy Time (in minutes and seconds):  0 Min and 54 seconds

Number of Acquired Images:

PROCEDURE:
After obtaining informed written consent from the patient, the right
lateral knee was prepped with Betadine. 2% lidocaine used for local
anesthesia. 22 gauge needle was advanced into the knee joint under
the patella from a lateral approach. No joint fluid could be
aspirated. Contrast was injected.

40 mL of contrast injected. The solution contained 30 mL of
Omnipaque 180 and 10 mL 1% lidocaine.

A wrap was placed over the lower thigh and the patient walked to CT
scan for further evaluation.
IMPRESSION: Successful right knee injection for CT.

## 2016-08-27 IMAGING — CT CT KNEE*R* W/CM
3 series · 16 of 33 positions shown, 19 images · non-contrast
Comparison: Injection images dated 09/25/2014

CLINICAL DATA: Right knee pain for 4 weeks.

EXAM:
CT ARTHROGRAPHY OF THE RIGHT KNEE
TECHNIQUE: Multidetector CT imaging was performed following the standard
protocol after injection of dilute contrast into the joint.

[Series 5: knee soft · axial · 0.35mm/px · z∈[-76,+110]mm · 8 of 88 slices shown, 10 images]
[im 7/88  soft-tissue]
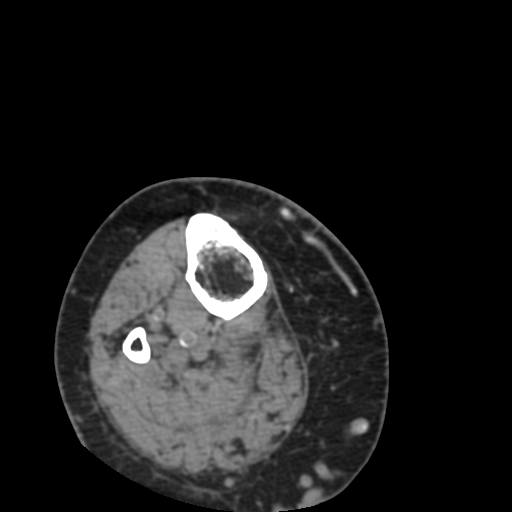
[im 7/88  bone]
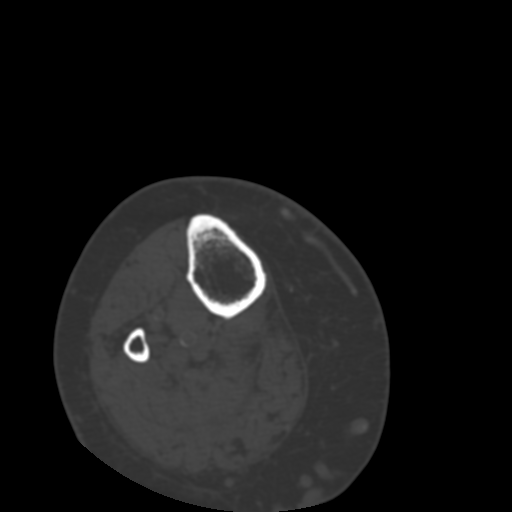
[im 21/88  bone]
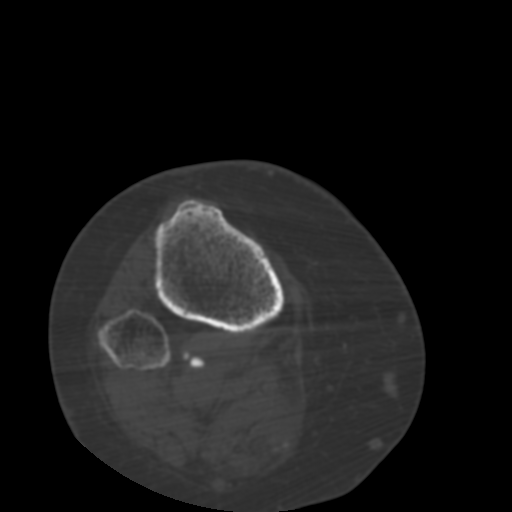
[im 27/88  bone]
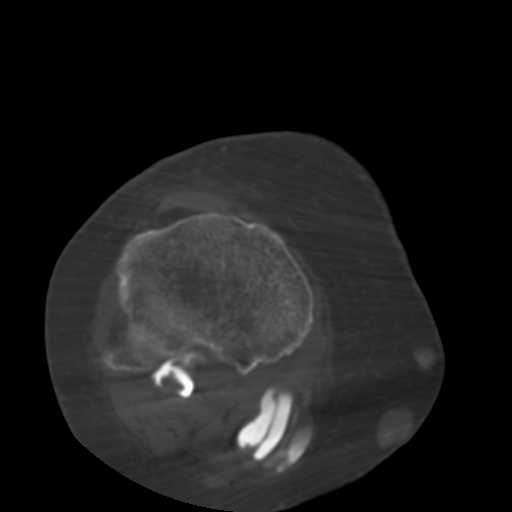
[im 41/88  bone]
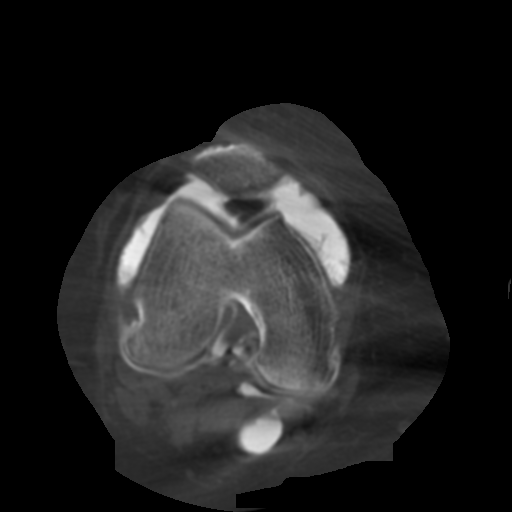
[im 47/88  soft-tissue]
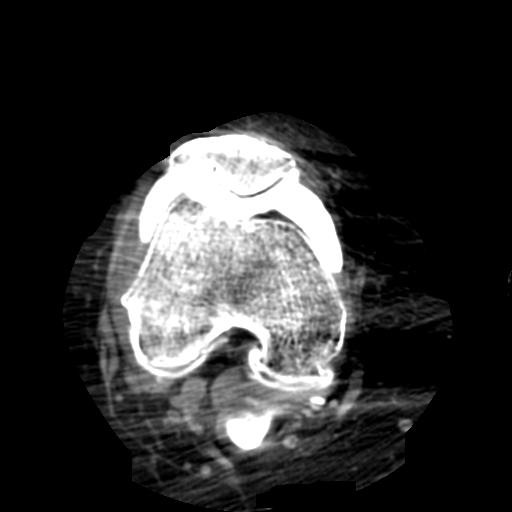
[im 47/88  bone]
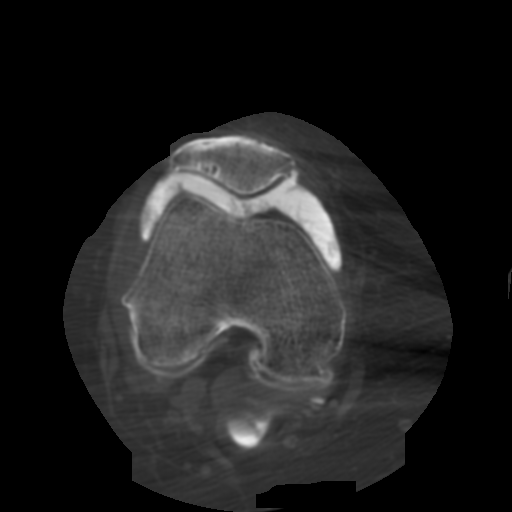
[im 61/88  bone]
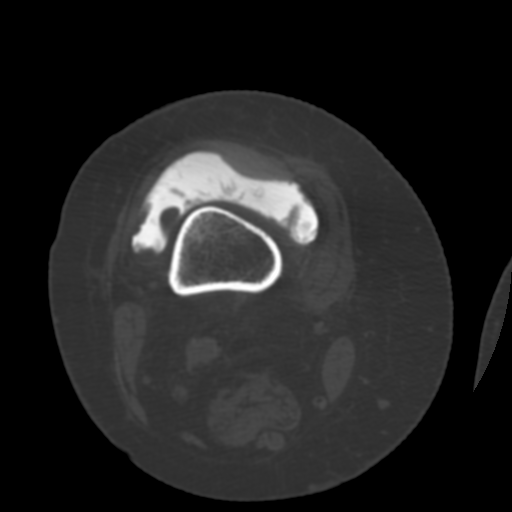
[im 67/88  bone]
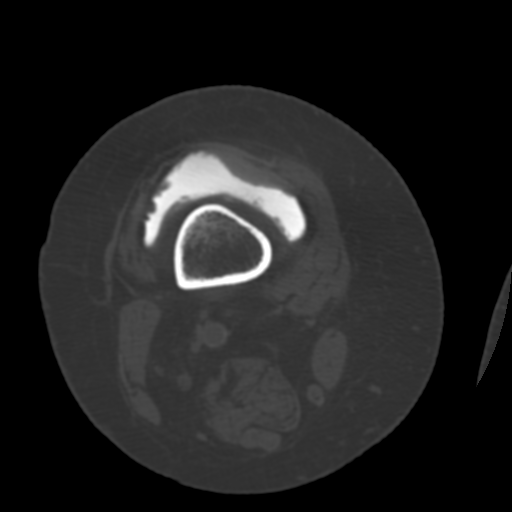
[im 81/88  bone]
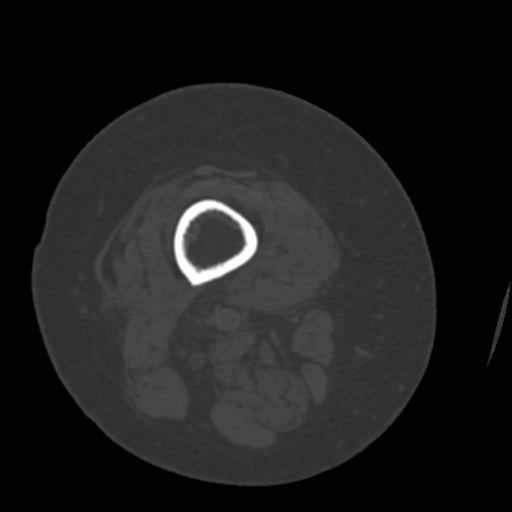

[Series 300: cor soft · coronal · 0.44mm/px · 3 of 81 slices shown]
[im 17/81  bone]
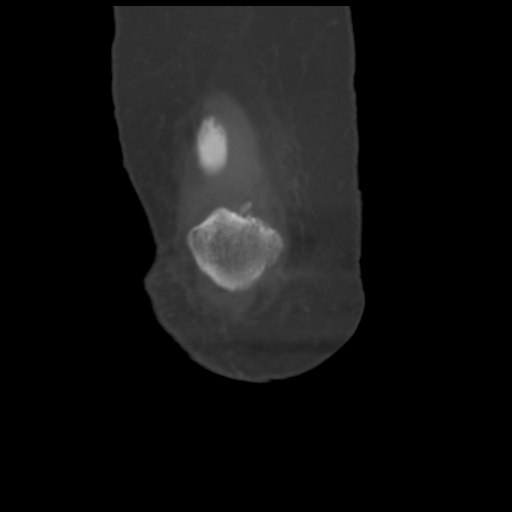
[im 33/81  bone]
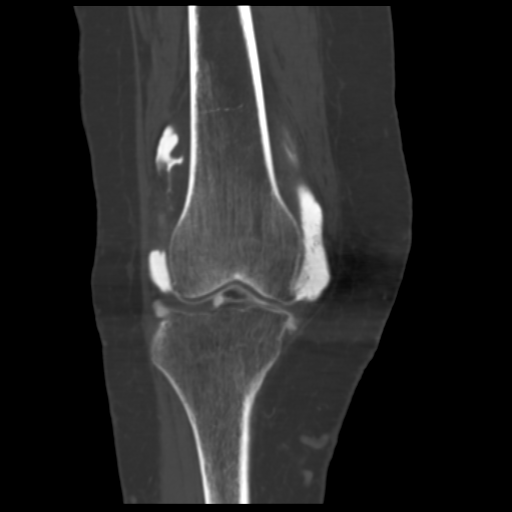
[im 49/81  bone]
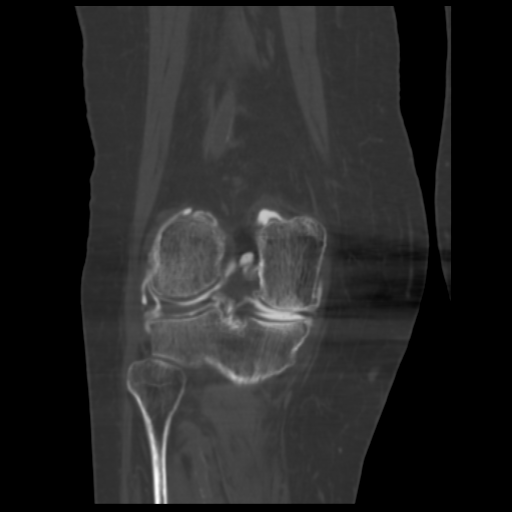

[Series 301: sag soft · sagittal · 0.44mm/px · 5 of 81 slices shown, 6 images]
[im 27/81  bone]
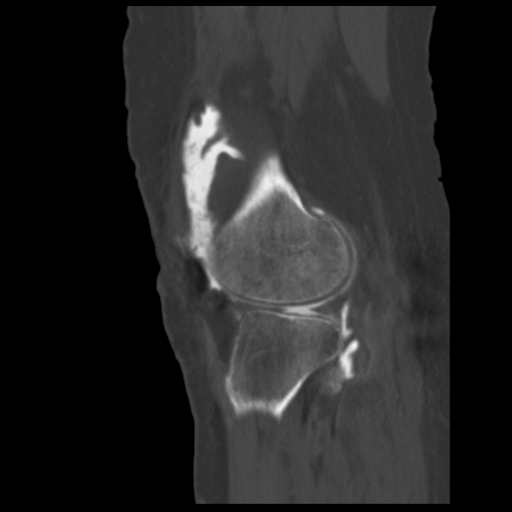
[im 34/81  bone]
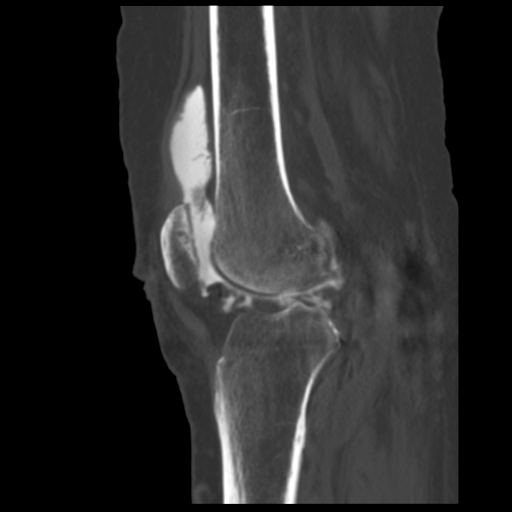
[im 41/81  soft-tissue]
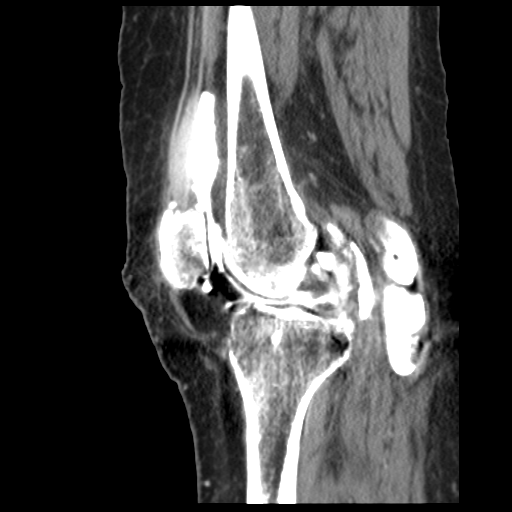
[im 41/81  bone]
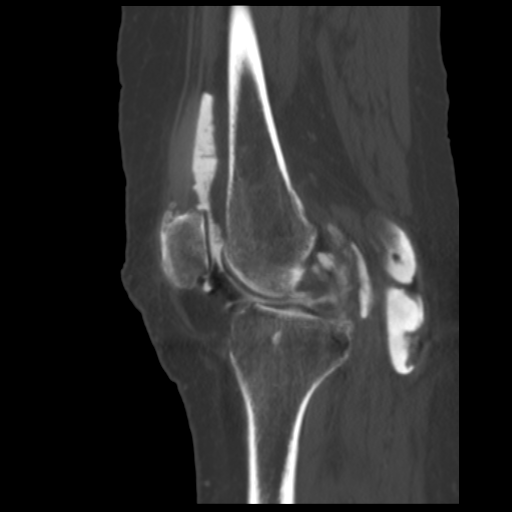
[im 47/81  bone]
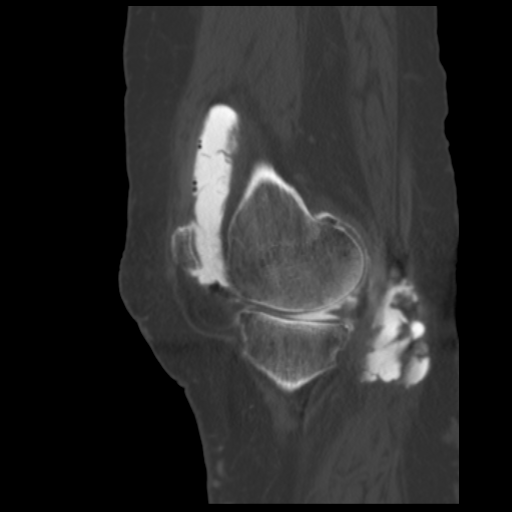
[im 54/81  bone]
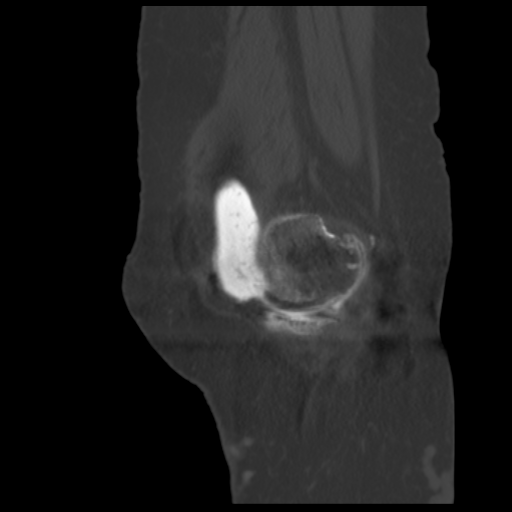

[16 of 33 positions shown; findings below may reference images not displayed]

FINDINGS: Medial meniscus: There is a horizontal tear of the superior surface
of the posterior horn. The midbody is completely avulsed and is not
identified. The anterior horn is peripherally subluxed and blunted.

Lateral meniscus:  Normal.

CARTILAGE:

Patellofemoral: Focal grade 4 chondromalacia of the lateral facet of
the patella.

Medial: Diffuse full-thickness cartilage loss in the central portion
of the femoral condyle and tibial plateau.

Lateral: 12 x 21 mm area of almost full-thickness cartilage loss of
the posterior aspect of the tibial plateau.

Cruciate ligaments:  Normal.

Collateral ligaments:  Normal.

Joint: There synovial hypertrophy consistent with synovitis. 5 mm
loose body in the posterior aspect of the joint posterior to the
posterior cruciate ligament.

Popliteal space: Complex 7 x 3 x 2.5 cm Baker's cyst. Communicating
and ganglion cyst extends along the popliteus tendon.

Extensor mechanism:  Intact.

Bones:  Small tricompartmental marginal osteophytes.
IMPRESSION: 1. Extensive tear of the medial meniscus with avulsion of the
midbody. Horizontal tear of the posterior horn. No normal appearing
anterior horn.
2. Tricompartmental chondromalacia, most extensive in the medial
compartment.

## 2016-09-11 ENCOUNTER — Ambulatory Visit (INDEPENDENT_AMBULATORY_CARE_PROVIDER_SITE_OTHER): Payer: Medicare Other | Admitting: *Deleted

## 2016-09-11 DIAGNOSIS — I495 Sick sinus syndrome: Secondary | ICD-10-CM

## 2016-09-11 NOTE — Progress Notes (Signed)
Remote pacemaker transmission.   

## 2016-09-12 ENCOUNTER — Encounter: Payer: Self-pay | Admitting: Cardiology

## 2016-09-13 LAB — CUP PACEART REMOTE DEVICE CHECK
Battery Remaining Percentage: 95.5 %
Battery Voltage: 2.96 V
Brady Statistic AP VP Percent: 98 %
Brady Statistic RA Percent Paced: 38 %
Brady Statistic RV Percent Paced: 96 %
Date Time Interrogation Session: 20180402070136
Implantable Lead Implant Date: 20031028
Implantable Lead Location: 753860
Implantable Pulse Generator Implant Date: 20140107
Lead Channel Impedance Value: 490 Ohm
Lead Channel Pacing Threshold Amplitude: 1 V
Lead Channel Pacing Threshold Pulse Width: 0.5 ms
Lead Channel Sensing Intrinsic Amplitude: 0.3 mV
Lead Channel Setting Sensing Sensitivity: 2 mV
MDC IDC LEAD IMPLANT DT: 20031028
MDC IDC LEAD LOCATION: 753859
MDC IDC MSMT BATTERY REMAINING LONGEVITY: 101 mo
MDC IDC MSMT LEADCHNL RA IMPEDANCE VALUE: 380 Ohm
MDC IDC MSMT LEADCHNL RV PACING THRESHOLD AMPLITUDE: 1 V
MDC IDC MSMT LEADCHNL RV PACING THRESHOLD PULSEWIDTH: 0.5 ms
MDC IDC MSMT LEADCHNL RV SENSING INTR AMPL: 12 mV
MDC IDC PG SERIAL: 7438567
MDC IDC SET LEADCHNL RA PACING AMPLITUDE: 2 V
MDC IDC SET LEADCHNL RV PACING AMPLITUDE: 2.5 V
MDC IDC SET LEADCHNL RV PACING PULSEWIDTH: 0.5 ms
MDC IDC STAT BRADY AP VS PERCENT: 1 %
MDC IDC STAT BRADY AS VP PERCENT: 1 %
MDC IDC STAT BRADY AS VS PERCENT: 1 %

## 2016-09-14 DIAGNOSIS — M62830 Muscle spasm of back: Secondary | ICD-10-CM | POA: Insufficient documentation

## 2016-09-15 ENCOUNTER — Encounter: Payer: Self-pay | Admitting: Internal Medicine

## 2016-09-22 ENCOUNTER — Ambulatory Visit (INDEPENDENT_AMBULATORY_CARE_PROVIDER_SITE_OTHER): Payer: Medicare Other | Admitting: Internal Medicine

## 2016-09-22 ENCOUNTER — Encounter: Payer: Self-pay | Admitting: Internal Medicine

## 2016-09-22 VITALS — BP 122/80 | HR 62 | Ht 66.0 in | Wt 183.8 lb

## 2016-09-22 DIAGNOSIS — I4819 Other persistent atrial fibrillation: Secondary | ICD-10-CM

## 2016-09-22 DIAGNOSIS — I10 Essential (primary) hypertension: Secondary | ICD-10-CM

## 2016-09-22 DIAGNOSIS — I495 Sick sinus syndrome: Secondary | ICD-10-CM | POA: Diagnosis not present

## 2016-09-22 DIAGNOSIS — I481 Persistent atrial fibrillation: Secondary | ICD-10-CM

## 2016-09-22 NOTE — Progress Notes (Signed)
Electrophysiology Office Note   Date:  09/22/2016   ID:  [name], DOB 1929/06/04, MRN [id number]  PCP:  Sherrie Mustache, MD  Cardiologist:  Dr Domenic Polite Primary Electrophysiologist: Thompson Grayer, MD    CC: afib   History of Present Illness: [name] is a 83 y.o. female who presents today for electrophysiology evaluation.   She is doing well.  She remains very active.  She spent February in [place] visiting her two sons.  Minimal symptoms with afib.  + rare SOB. Today, she denies symptoms of  chest pain,  orthopnea, PND, lower extremity edema, claudication, dizziness, presyncope, syncope, bleeding, or neurologic sequela. The patient is tolerating medications without difficulties and is otherwise without complaint today.    Past Medical History:  Diagnosis Date  . Arthritis   . Atrial flutter ([place])    s/p RFA 2003  . Breast cancer ([place])    1980's - lumpectomy only  . Colon polyps   . Diverticulosis   . DVT, lower extremity ([place]) 1998 and 1999   Bilateral  . Essential hypertension, benign   . GERD (gastroesophageal reflux disease)   . Glaucoma   . IBS (irritable bowel syndrome)   . Mixed hyperlipidemia   . Osteoporosis   . Persistent atrial fibrillation ([place])   . Pneumonia 2011  . Sick sinus syndrome (HCC)    s/p PPM ([facility])  . Urinary tract infection   . Varicose veins    Past Surgical History:  Procedure Laterality Date  . BREAST LUMPECTOMY  1980's   Right breast   . CARDIAC CATHETERIZATION  1999   Normal coronaries  . Cataracts Bilateral   . CHOLECYSTECTOMY    . COLONOSCOPY W/ BIOPSIES AND POLYPECTOMY  03/2003, 05/2008, 02/20/2011   severe diverticulosis, tubulovillous adenoma polyp, internal and external hemorrhoids 2012: severe diverticulosis, 4 mm polyp, hemorrhoids  . DILATION AND CURETTAGE OF UTERUS    . EYE SURGERY    . Fractured wrist Left 2013   repair  . INSERT / REPLACE / REMOVE PACEMAKER    . KNEE ARTHROSCOPY WITH MEDIAL  MENISECTOMY Right 10/21/2014   Procedure: RIGHT KNEE ARTHROSCOPY WITH MEDIAL MENISECTOM,lateral menisectomy,synovectomy suprapatellar pouch;  Surgeon: Latanya Maudlin, MD;  Location: [facility];  Service: Orthopedics;  Laterality: Right;  . PACEMAKER GENERATOR CHANGE  06/18/12   [facility] DR RF, Dr Rayann Heman  . PACEMAKER INSERTION  2003  . PERMANENT PACEMAKER GENERATOR CHANGE N/A 06/18/2012   Procedure: PERMANENT PACEMAKER GENERATOR CHANGE;  Surgeon: Thompson Grayer, MD;  Location: [facility] CATH LAB;  Service: Cardiovascular;  Laterality: N/A;  . TOTAL KNEE ARTHROPLASTY  12/20/2011   Procedure: TOTAL KNEE ARTHROPLASTY;  Surgeon: Tobi Bastos, MD;  Location: [facility];  Service: Orthopedics;  Laterality: Left;  . TOTAL KNEE ARTHROPLASTY Right 02/10/2015   Procedure: TOTAL RIGHT KNEE ARTHROPLASTY;  Surgeon: Latanya Maudlin, MD;  Location: [facility];  Service: Orthopedics;  Laterality: Right;     Current Outpatient Prescriptions  Medication Sig Dispense Refill  . acetaminophen (TYLENOL) 500 MG tablet Take 1,000 mg by mouth every 6 (six) hours as needed for mild pain.    Marland Kitchen alendronate (FOSAMAX) 70 MG tablet TAKE 1 TABLET EVERY WEEK    . amiodarone (PACERONE) 200 MG tablet Take 0.5 tablets (100 mg total) by mouth daily. 15 tablet 6  . apixaban (ELIQUIS) 5 MG TABS tablet Take 1 tablet (5 mg total) by mouth 2 (two) times daily. 60 tablet 6  . calcium-vitamin D (OSCAL WITH D) 500-200 MG-UNIT  tablet Take 2 tablets by mouth daily with breakfast.    . cyanocobalamin (,VITAMIN B-12,) 1000 MCG/ML injection Inject 1,000 mcg into the muscle every 30 (thirty) days.      Marland Kitchen latanoprost (XALATAN) 0.005 % ophthalmic solution Place 1 drop into both eyes at bedtime.    . Multiple Vitamin (MULTIVITAMIN) tablet Take 1 tablet by mouth every morning.     . nadolol (CORGARD) 40 MG tablet TAKE 1&1/2 TABLETS EVERY MORNING 45 tablet 1  . nitroGLYCERIN (NITROSTAT) 0.4 MG SL tablet Place 1 tablet (0.4 mg total) under the tongue every 5 (five)  minutes as needed for chest pain. 25 tablet 1  . omeprazole (PRILOSEC) 20 MG capsule Take 1 capsule (20 mg total) by mouth daily. 30 capsule 11   No current facility-administered medications for this visit.    Facility-Administered Medications Ordered in Other Visits  Medication Dose Route Frequency Provider Last Rate Last Dose  . bupivacaine liposome (EXPAREL) 1.3 % injection 266 mg  20 mL Infiltration Once [employer], PA-C        Allergies:   Iodinated diagnostic agents and Naproxen   Social History:  The patient  reports that she has never smoked. She has never used smokeless tobacco. She reports that she does not drink alcohol or use drugs.   Family History:  The patient's family history includes Alzheimer's disease in her sister; Cancer in her father; Colon cancer in her brother, maternal grandfather, and other; Stroke in her mother.    ROS:  Please see the history of present illness.   All other systems are reviewed and negative.    PHYSICAL EXAM: VS:  BP 122/80   Pulse 62   Ht 5\' 6"  (1.676 m)   Wt 183 lb 12.8 oz (83.4 kg)   SpO2 97%   BMI 29.67 kg/m  , BMI Body mass index is 29.67 kg/m. GEN: Well nourished, well developed, in no acute distress  HEENT: normal  Neck: no JVD, carotid bruits, or masses Cardiac: RRR; no murmurs, rubs, or gallops,no edema  Respiratory:  clear to auscultation bilaterally, normal work of breathing GI: soft, nontender, nondistended, + BS MS: kyphotic Skin: warm and dry, device pocket is well healed Neuro:  Strength and sensation are intact Psych: euthymic mood, full affect  Device interrogation is personally reviewed today in detail.  See PaceArt for details.  Wt Readings from Last 3 Encounters:  09/22/16 183 lb 12.8 oz (83.4 kg)  03/22/16 175 lb (79.4 kg)  09/24/15 177 lb 6.4 oz (80.5 kg)     ASSESSMENT AND PLAN:  1.  Sick sinus syndrome Normal pacemaker function See Pace Art report No changes today  2. Persistent atrial  fibrillation More frequent (AF burden 27%) chads2vasc score is at least 4 Continue eliquis long term Interestingly, she was persistently in afib from June to January but has pretty much been in sinus rhythm since that time. I do not see any labs in the past year.  Importance of compliance with LFTs/TFTs on amiodarone was stressed today.  She wishes to have these followed by Dr Edrick Oh.  3. HTN Stable No change required today   Current medicines are reviewed at length with the patient today.   The patient does not have concerns regarding her medicines.  The following changes were made today:  none  Follow-up: merlin, return to see me in 1 year  Signed, Thompson Grayer, MD  09/22/2016 11:08 AM     [place]  [address] [address] [phone] (office) [phone] (fax)

## 2016-09-22 NOTE — Patient Instructions (Signed)
Medication Instructions:  Continue all current medications.  Labwork: none  Testing/Procedures: none  Follow-Up: Your physician wants you to follow up in:  1 year.  You will receive a reminder letter in the mail one-two months in advance.  If you don't receive a letter, please call our office to schedule the follow up appointment - Dr. Allred.  Any Other Special Instructions Will Be Listed Below (If Applicable). Remote monitoring is used to monitor your Pacemaker of ICD from home. This monitoring reduces the number of office visits required to check your device to one time per year. It allows us to keep an eye on the functioning of your device to ensure it is working properly. You are scheduled for a device check from home on 12/25/2016.  You may send your transmission at any time that day. If you have a wireless device, the transmission will be sent automatically. After your physician reviews your transmission, you will receive a postcard with your next transmission date.  If you need a refill on your cardiac medications before your next appointment, please call your pharmacy.  

## 2016-09-26 LAB — CUP PACEART INCLINIC DEVICE CHECK
Battery Voltage: 2.96 V
Brady Statistic RV Percent Paced: 96 %
Date Time Interrogation Session: 20180413144909
Implantable Lead Implant Date: 20031028
Implantable Lead Location: 753860
Lead Channel Pacing Threshold Amplitude: 0.75 V
Lead Channel Pacing Threshold Amplitude: 1 V
Lead Channel Pacing Threshold Pulse Width: 0.5 ms
Lead Channel Setting Pacing Pulse Width: 0.5 ms
Lead Channel Setting Sensing Sensitivity: 2 mV
MDC IDC LEAD IMPLANT DT: 20031028
MDC IDC LEAD LOCATION: 753859
MDC IDC MSMT LEADCHNL RA IMPEDANCE VALUE: 362.5 Ohm
MDC IDC MSMT LEADCHNL RA PACING THRESHOLD AMPLITUDE: 1 V
MDC IDC MSMT LEADCHNL RA PACING THRESHOLD PULSEWIDTH: 0.5 ms
MDC IDC MSMT LEADCHNL RA SENSING INTR AMPL: 0.3 mV
MDC IDC MSMT LEADCHNL RV IMPEDANCE VALUE: 487.5 Ohm
MDC IDC MSMT LEADCHNL RV PACING THRESHOLD AMPLITUDE: 0.75 V
MDC IDC MSMT LEADCHNL RV PACING THRESHOLD PULSEWIDTH: 0.5 ms
MDC IDC MSMT LEADCHNL RV PACING THRESHOLD PULSEWIDTH: 0.5 ms
MDC IDC MSMT LEADCHNL RV SENSING INTR AMPL: 12 mV
MDC IDC PG IMPLANT DT: 20140107
MDC IDC SET LEADCHNL RA PACING AMPLITUDE: 2 V
MDC IDC SET LEADCHNL RV PACING AMPLITUDE: 2.5 V
MDC IDC STAT BRADY RA PERCENT PACED: 40 %
Pulse Gen Model: 2210
Pulse Gen Serial Number: 7438567

## 2016-11-20 NOTE — Progress Notes (Signed)
Cardiology Office Note  Date: 11/22/2016   ID: [name], DOB [birth year], MRN [id number]  PCP: Dione Housekeeper, MD  Primary Cardiologist: Rozann Lesches, MD   Chief Complaint  Patient presents with  . PAF    History of Present Illness: [name] is an 83 y.o. female that I last saw in October 2017. She presents for a routine follow-up visit. States that she has been feeling well. She has been planting flowers and also raising a small garden. She reports no exertional chest pain, no falls, no palpitations.  She continues to follow with Dr. Rayann Heman in the device clinic, [place] pacemaker in place for sick sinus syndrome. Device interrogation in April revealed 27% AT/AF episodes. I personally reviewed her ECG today which shows dual-chamber pacing.  I reviewed her medications. Cardiac regimen includes low-dose amiodarone and Eliquis. She had lab work done in March as noted below.  Past Medical History:  Diagnosis Date  . Arthritis   . Atrial flutter ([place])    s/p RFA 2003  . Breast cancer ([place])    1980's - lumpectomy only  . Colon polyps   . Diverticulosis   . DVT, lower extremity ([place]) 1998 and 1999   Bilateral  . Essential hypertension, benign   . GERD (gastroesophageal reflux disease)   . Glaucoma   . IBS (irritable bowel syndrome)   . Mixed hyperlipidemia   . Osteoporosis   . Persistent atrial fibrillation ([place])   . Pneumonia 2011  . Sick sinus syndrome (HCC)    s/p PPM ([facility])  . Urinary tract infection   . Varicose veins     Past Surgical History:  Procedure Laterality Date  . BREAST LUMPECTOMY  1980's   Right breast   . CARDIAC CATHETERIZATION  1999   Normal coronaries  . Cataracts Bilateral   . CHOLECYSTECTOMY    . COLONOSCOPY W/ BIOPSIES AND POLYPECTOMY  03/2003, 05/2008, 02/20/2011   severe diverticulosis, tubulovillous adenoma polyp, internal and external hemorrhoids 2012: severe diverticulosis, 4 mm polyp, hemorrhoids  . DILATION AND  CURETTAGE OF UTERUS    . EYE SURGERY    . Fractured wrist Left 2013   repair  . INSERT / REPLACE / REMOVE PACEMAKER    . KNEE ARTHROSCOPY WITH MEDIAL MENISECTOMY Right 10/21/2014   Procedure: RIGHT KNEE ARTHROSCOPY WITH MEDIAL MENISECTOM,lateral menisectomy,synovectomy suprapatellar pouch;  Surgeon: Latanya Maudlin, MD;  Location: [facility];  Service: Orthopedics;  Laterality: Right;  . PACEMAKER GENERATOR CHANGE  06/18/12   SJM Accent DR RF, Dr Rayann Heman  . PACEMAKER INSERTION  2003  . PERMANENT PACEMAKER GENERATOR CHANGE N/A 06/18/2012   Procedure: PERMANENT PACEMAKER GENERATOR CHANGE;  Surgeon: Thompson Grayer, MD;  Location[facility] CATH LAB;  Service: Cardiovascular;  Laterality: N/A;  . TOTAL KNEE ARTHROPLASTY  12/20/2011   Procedure: TOTAL KNEE ARTHROPLASTY;  Surgeon: Tobi Bastos, MD;  Location: [facility];  Service: Orthopedics;  Laterality: Left;  . TOTAL KNEE ARTHROPLASTY Right 02/10/2015   Procedure: TOTAL RIGHT KNEE ARTHROPLASTY;  Surgeon: Latanya Maudlin, MD;  Location: [facility];  Service: Orthopedics;  Laterality: Right;    Current Outpatient Prescriptions  Medication Sig Dispense Refill  . acetaminophen (TYLENOL) 500 MG tablet Take 1,000 mg by mouth every 6 (six) hours as needed for mild pain.    Marland Kitchen alendronate (FOSAMAX) 70 MG tablet TAKE 1 TABLET EVERY WEEK    . amiodarone (PACERONE) 200 MG tablet Take 0.5 tablets (100 mg total) by mouth daily. 15 tablet 6  .  apixaban (ELIQUIS) 5 MG TABS tablet Take 1 tablet (5 mg total) by mouth 2 (two) times daily. 60 tablet 6  . calcium-vitamin D (OSCAL WITH D) 500-200 MG-UNIT tablet Take 2 tablets by mouth daily with breakfast.    . cyanocobalamin (,VITAMIN B-12,) 1000 MCG/ML injection Inject 1,000 mcg into the muscle every 30 (thirty) days.      Marland Kitchen latanoprost (XALATAN) 0.005 % ophthalmic solution Place 1 drop into both eyes at bedtime.    . Multiple Vitamin (MULTIVITAMIN) tablet Take 1 tablet by mouth every morning.     . nadolol (CORGARD) 40 MG tablet TAKE  1&1/2 TABLETS EVERY MORNING 45 tablet 1  . nitroGLYCERIN (NITROSTAT) 0.4 MG SL tablet Place 1 tablet (0.4 mg total) under the tongue every 5 (five) minutes as needed for chest pain. 25 tablet 1  . omeprazole (PRILOSEC) 20 MG capsule Take 1 capsule (20 mg total) by mouth daily. 30 capsule 11   No current facility-administered medications for this visit.    Facility-Administered Medications Ordered in Other Visits  Medication Dose Route Frequency Provider Last Rate Last Dose  . bupivacaine liposome (EXPAREL) 1.3 % injection 266 mg  20 mL Infiltration Once [place], [employer], PA-C       Allergies:  Iodinated diagnostic agents and Naproxen   Social History: The patient  reports that she has never smoked. She has never used smokeless tobacco. She reports that she does not drink alcohol or use drugs.   ROS:  Please see the history of present illness. Otherwise, complete review of systems is positive for arthritic pain and stiffness, uses a cane.  All other systems are reviewed and negative.   Physical Exam: VS:  BP 128/80   Pulse (!) 53   Ht 5\' 6"  (1.676 m)   Wt 183 lb (83 kg)   SpO2 99%   BMI 29.54 kg/m , BMI Body mass index is 29.54 kg/m.  Wt Readings from Last 3 Encounters:  11/22/16 183 lb (83 kg)  09/22/16 183 lb 12.8 oz (83.4 kg)  03/22/16 175 lb (79.4 kg)    Overweight elderly woman, no distress. Using a cane. HEENT: Conjunctiva and lids normal, oropharynx clear.  Neck: Supple, no carotid bruits, no elevated jugular venous pressure.  Lungs: Clear to auscultation, nonlabored.  Cardiac: RRR, no significant systolic murmur or S3..  Abdomen: Soft, bowel sounds present.  Extremities: No pitting. Varicose veins noted. Skin: Warm and dry. Musculoskeletal: No kyphosis. Neuropsychiatric: Alert and oriented 3, affect appropriate.  ECG: I personally reviewed the tracing from 10/28/2015 which showed dual-chamber pacing.  Recent Labwork:  June 2017: BUN 13, creatinine 0.8,  potassium 4.5, AST 13, ALT 12, hemoglobin 12.3, platelets 182, cholesterol 179, triglycerides 82, HDL 61, LDL 102 March 2018: Hemoglobin 12.3, platelets 166, BUN 21, creatinine 0.75, potassium 4.6, AST 19, ALT 15, cholesterol 203, triglycerides 55, HDL 68, LDL 124  Other Studies Reviewed Today:  Lexiscan Myoview 08/01/2012: No perfusion evidence of scar or ischemia with LVEF 63%.  Assessment and Plan:  1. Paroxysmal atrial fibrillation, reasonable rhythm control based on device interrogation and symptoms. Plan to continue amiodarone and Eliquis. Lab work from March reviewed. She reports no bleeding problems.  2. Essential hypertension, blood pressure is well controlled today.  3. Sick sinus syndrome status post [employer]. Jude pacemaker, followed by Dr. Rayann Heman.  Current medicines were reviewed with the patient today.   Orders Placed This Encounter  Procedures  . EKG 12-Lead    Disposition: Follow-up in 6 months.  Signed,  Satira Sark, MD, [facility] 11/22/2016 11:03 AM    [facility] [place], [place], [place] [address] Phone: [phone]; Fax: [phone]

## 2016-11-22 ENCOUNTER — Encounter: Payer: Self-pay | Admitting: Cardiology

## 2016-11-22 ENCOUNTER — Ambulatory Visit (INDEPENDENT_AMBULATORY_CARE_PROVIDER_SITE_OTHER): Payer: Medicare Other | Admitting: Cardiology

## 2016-11-22 VITALS — BP 128/80 | HR 53 | Ht 66.0 in | Wt 183.0 lb

## 2016-11-22 DIAGNOSIS — I495 Sick sinus syndrome: Secondary | ICD-10-CM | POA: Diagnosis not present

## 2016-11-22 DIAGNOSIS — I10 Essential (primary) hypertension: Secondary | ICD-10-CM | POA: Diagnosis not present

## 2016-11-22 DIAGNOSIS — I480 Paroxysmal atrial fibrillation: Secondary | ICD-10-CM | POA: Diagnosis not present

## 2016-11-22 MED ORDER — NADOLOL 40 MG PO TABS: ORAL_TABLET | ORAL | 1 refills | Status: DC

## 2016-11-22 NOTE — Patient Instructions (Signed)

## 2016-12-25 ENCOUNTER — Other Ambulatory Visit: Payer: Self-pay | Admitting: *Deleted

## 2016-12-25 ENCOUNTER — Ambulatory Visit (INDEPENDENT_AMBULATORY_CARE_PROVIDER_SITE_OTHER): Payer: Medicare Other | Admitting: *Deleted

## 2016-12-25 DIAGNOSIS — I495 Sick sinus syndrome: Secondary | ICD-10-CM

## 2016-12-25 MED ORDER — NITROGLYCERIN 0.4 MG SL SUBL: 0.4000 mg | SUBLINGUAL_TABLET | SUBLINGUAL | 1 refills | Status: DC | PRN

## 2016-12-25 NOTE — Progress Notes (Signed)
Remote pacemaker transmission.   

## 2016-12-27 ENCOUNTER — Encounter: Payer: Self-pay | Admitting: Cardiology

## 2016-12-27 LAB — CUP PACEART REMOTE DEVICE CHECK
Battery Remaining Percentage: 91 %
Battery Voltage: 2.95 V
Brady Statistic AS VS Percent: 1 %
Date Time Interrogation Session: 20180716074302
Implantable Lead Implant Date: 20031028
Implantable Lead Location: 753859
Implantable Lead Location: 753860
Implantable Pulse Generator Implant Date: 20140107
Lead Channel Pacing Threshold Amplitude: 1 V
Lead Channel Pacing Threshold Pulse Width: 0.5 ms
Lead Channel Sensing Intrinsic Amplitude: 0.2 mV
Lead Channel Setting Pacing Amplitude: 2 V
Lead Channel Setting Pacing Pulse Width: 0.5 ms
MDC IDC LEAD IMPLANT DT: 20031028
MDC IDC MSMT BATTERY REMAINING LONGEVITY: 88 mo
MDC IDC MSMT LEADCHNL RA IMPEDANCE VALUE: 360 Ohm
MDC IDC MSMT LEADCHNL RA PACING THRESHOLD PULSEWIDTH: 0.5 ms
MDC IDC MSMT LEADCHNL RV IMPEDANCE VALUE: 460 Ohm
MDC IDC MSMT LEADCHNL RV PACING THRESHOLD AMPLITUDE: 0.75 V
MDC IDC MSMT LEADCHNL RV SENSING INTR AMPL: 12 mV
MDC IDC PG SERIAL: 7438567
MDC IDC SET LEADCHNL RV PACING AMPLITUDE: 2.5 V
MDC IDC SET LEADCHNL RV SENSING SENSITIVITY: 2 mV
MDC IDC STAT BRADY AP VP PERCENT: 99 %
MDC IDC STAT BRADY AP VS PERCENT: 1 %
MDC IDC STAT BRADY AS VP PERCENT: 1 %
MDC IDC STAT BRADY RA PERCENT PACED: 99 %
MDC IDC STAT BRADY RV PERCENT PACED: 99 %

## 2017-03-26 ENCOUNTER — Ambulatory Visit (INDEPENDENT_AMBULATORY_CARE_PROVIDER_SITE_OTHER): Payer: Medicare Other | Admitting: *Deleted

## 2017-03-26 DIAGNOSIS — I495 Sick sinus syndrome: Secondary | ICD-10-CM | POA: Diagnosis not present

## 2017-03-26 NOTE — Progress Notes (Signed)
Remote pacemaker transmission.   

## 2017-03-28 LAB — CUP PACEART REMOTE DEVICE CHECK
Brady Statistic AP VP Percent: 99 %
Brady Statistic AS VP Percent: 1 %
Brady Statistic RA Percent Paced: 98 %
Date Time Interrogation Session: 20181015061213
Implantable Lead Implant Date: 20031028
Implantable Lead Location: 753860
Lead Channel Impedance Value: 460 Ohm
Lead Channel Pacing Threshold Pulse Width: 0.5 ms
Lead Channel Sensing Intrinsic Amplitude: 10.5 mV
Lead Channel Setting Pacing Amplitude: 2 V
MDC IDC LEAD IMPLANT DT: 20031028
MDC IDC LEAD LOCATION: 753859
MDC IDC MSMT BATTERY REMAINING LONGEVITY: 88 mo
MDC IDC MSMT BATTERY REMAINING PERCENTAGE: 91 %
MDC IDC MSMT BATTERY VOLTAGE: 2.95 V
MDC IDC MSMT LEADCHNL RA IMPEDANCE VALUE: 360 Ohm
MDC IDC MSMT LEADCHNL RA PACING THRESHOLD AMPLITUDE: 1 V
MDC IDC MSMT LEADCHNL RA SENSING INTR AMPL: 0.2 mV
MDC IDC MSMT LEADCHNL RV PACING THRESHOLD AMPLITUDE: 0.75 V
MDC IDC MSMT LEADCHNL RV PACING THRESHOLD PULSEWIDTH: 0.5 ms
MDC IDC PG IMPLANT DT: 20140107
MDC IDC SET LEADCHNL RV PACING AMPLITUDE: 2.5 V
MDC IDC SET LEADCHNL RV PACING PULSEWIDTH: 0.5 ms
MDC IDC SET LEADCHNL RV SENSING SENSITIVITY: 2 mV
MDC IDC STAT BRADY AP VS PERCENT: 1 %
MDC IDC STAT BRADY AS VS PERCENT: 1 %
MDC IDC STAT BRADY RV PERCENT PACED: 99 %
Pulse Gen Model: 2210
Pulse Gen Serial Number: 7438567

## 2017-03-30 ENCOUNTER — Encounter: Payer: Self-pay | Admitting: Cardiology

## 2017-04-12 ENCOUNTER — Other Ambulatory Visit: Payer: Self-pay | Admitting: Cardiology

## 2017-04-16 IMAGING — CR DG CHEST 2V
2 series · 2 of 2 positions shown · non-contrast
Comparison: 04/18/2014.

CLINICAL DATA: 85-year-old female preoperative study for knee
surgery. Hypertension, heart murmur. Initial encounter.

EXAM:
CHEST  2 VIEW

[view not recorded (1 of 2)]
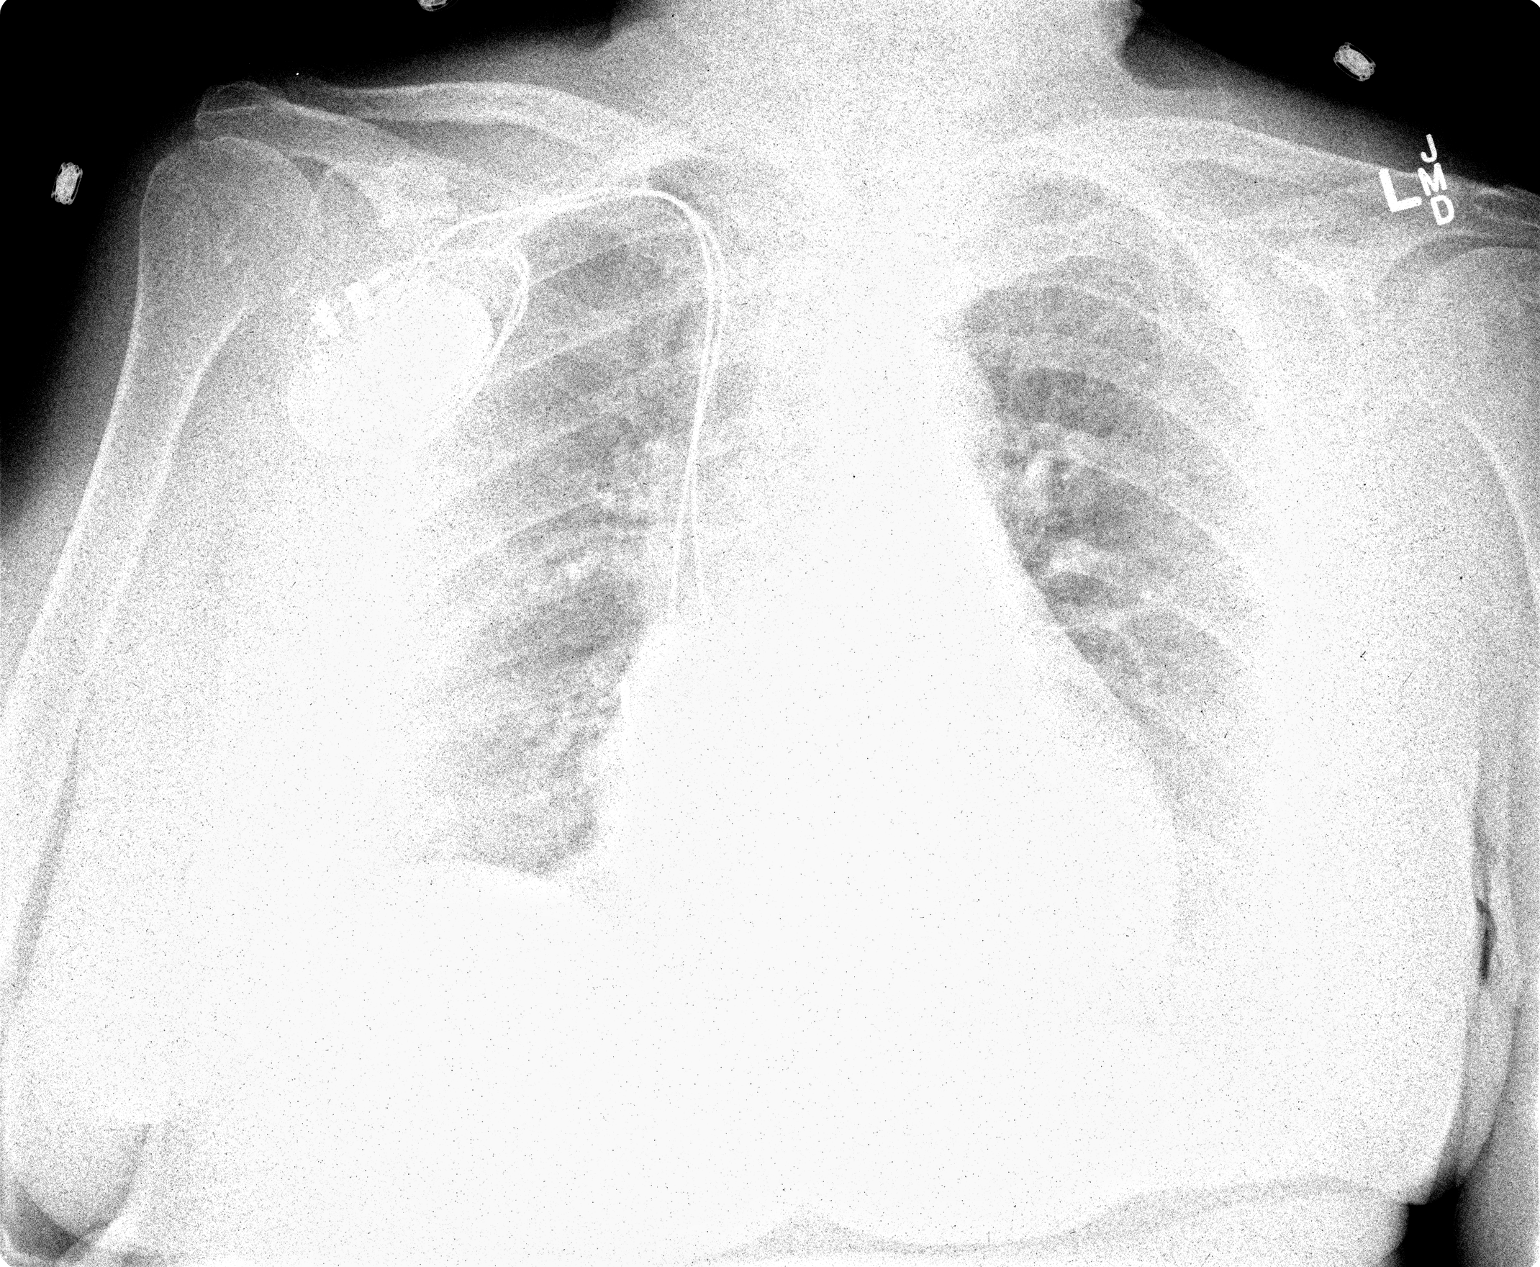

[view not recorded (2 of 2)]
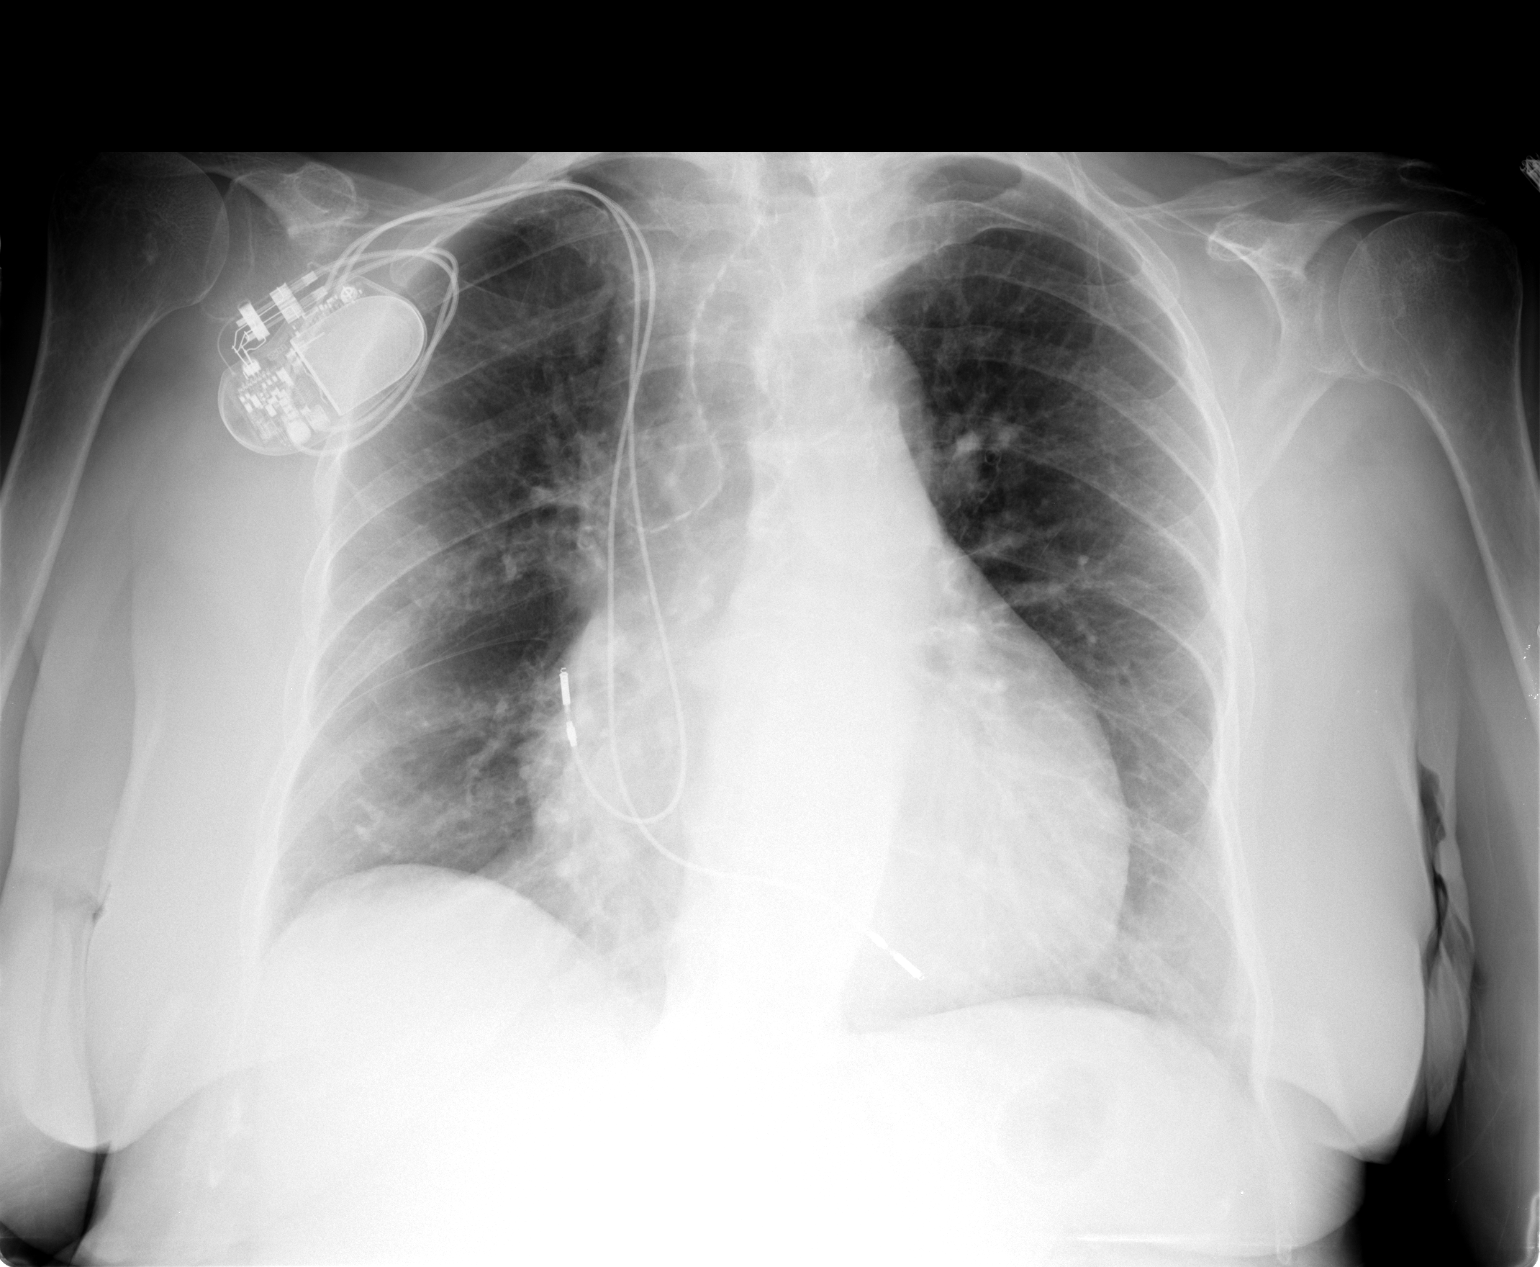

[2 of 2 positions shown; findings below may reference images not displayed]

FINDINGS: Stable right chest dual lead cardiac pacemaker. Stable cardiomegaly
and mediastinal contours. No pneumothorax, pulmonary edema or
pleural effusion. Stable lung volumes and mild increased
interstitial markings. No acute pulmonary opacity. No acute osseous
abnormality identified.
IMPRESSION: Stable cardiomegaly.  No acute cardiopulmonary abnormality.

## 2017-06-13 ENCOUNTER — Encounter: Payer: Self-pay | Admitting: *Deleted

## 2017-06-13 ENCOUNTER — Ambulatory Visit: Payer: Medicare Other | Admitting: Cardiology

## 2017-06-13 VITALS — BP 129/81 | HR 69 | Ht 66.0 in | Wt 185.4 lb

## 2017-06-13 DIAGNOSIS — I10 Essential (primary) hypertension: Secondary | ICD-10-CM | POA: Diagnosis not present

## 2017-06-13 DIAGNOSIS — I495 Sick sinus syndrome: Secondary | ICD-10-CM | POA: Diagnosis not present

## 2017-06-13 DIAGNOSIS — I480 Paroxysmal atrial fibrillation: Secondary | ICD-10-CM | POA: Diagnosis not present

## 2017-06-13 NOTE — Patient Instructions (Signed)

## 2017-06-13 NOTE — Progress Notes (Signed)
Cardiology Office Note  Date: 06/13/2017   ID: [name], DOB 01-13-1929, MRN [id number]  PCP: Dione Housekeeper, MD  Primary Cardiologist: Rozann Lesches, MD   Chief Complaint  Patient presents with  . PAF    History of Present Illness: [name] is an 84 y.o. female last seen in June. She presents for a routine follow-up visit. She reports occasional feelings of weakness but no palpitations or syncope. No exertional chest pain. She remains functional with her ADLs. She has done a lot of baking over the holidays.  She continues to follow in the device clinic with Dr. Rayann Heman, [place] pacemaker in place. Last device check indicated normal function with 1.1% AMS episodes.  Lab work from March is outlined below. She states that she has follow-up already arranged with Dr. Edrick Oh.  We went over her medications which are stable from a cardiac perspective and outlined below.  Past Medical History:  Diagnosis Date  . Arthritis   . Atrial flutter ([place])    s/p RFA 2003  . Breast cancer ([place])    1980's - lumpectomy only  . Colon polyps   . Diverticulosis   . DVT, lower extremity ([place]) 1998 and 1999   Bilateral  . Essential hypertension, benign   . GERD (gastroesophageal reflux disease)   . Glaucoma   . IBS (irritable bowel syndrome)   . Mixed hyperlipidemia   . Osteoporosis   . Persistent atrial fibrillation ([place])   . Pneumonia 2011  . Sick sinus syndrome ([facility])    s/p PPM ([facility])  . Urinary tract infection   . Varicose veins     Past Surgical History:  Procedure Laterality Date  . BREAST LUMPECTOMY  1980's   Right breast   . CARDIAC CATHETERIZATION  1999   Normal coronaries  . Cataracts Bilateral   . CHOLECYSTECTOMY    . COLONOSCOPY W/ BIOPSIES AND POLYPECTOMY  03/2003, 05/2008, 02/20/2011   severe diverticulosis, tubulovillous adenoma polyp, internal and external hemorrhoids 2012: severe diverticulosis, 4 mm polyp, hemorrhoids  . DILATION AND  CURETTAGE OF UTERUS    . EYE SURGERY    . Fractured wrist Left 2013   repair  . INSERT / REPLACE / REMOVE PACEMAKER    . KNEE ARTHROSCOPY WITH MEDIAL MENISECTOMY Right 10/21/2014   Procedure: RIGHT KNEE ARTHROSCOPY WITH MEDIAL MENISECTOM,lateral menisectomy,synovectomy suprapatellar pouch;  Surgeon: Latanya Maudlin, MD;  Location: [facility];  Service: Orthopedics;  Laterality: Right;  . PACEMAKER GENERATOR CHANGE  06/18/12   [facility] DR RF, Dr Rayann Heman  . PACEMAKER INSERTION  2003  . PERMANENT PACEMAKER GENERATOR CHANGE N/A 06/18/2012   Procedure: PERMANENT PACEMAKER GENERATOR CHANGE;  Surgeon: Thompson Grayer, MD;  Location: [facility] CATH LAB;  Service: Cardiovascular;  Laterality: N/A;  . TOTAL KNEE ARTHROPLASTY  12/20/2011   Procedure: TOTAL KNEE ARTHROPLASTY;  Surgeon: Tobi Bastos, MD;  [facility]: [facility];  Service: Orthopedics;  Laterality: Left;  . TOTAL KNEE ARTHROPLASTY Right 02/10/2015   Procedure: TOTAL RIGHT KNEE ARTHROPLASTY;  Surgeon: Latanya Maudlin, MD;  [facility];  Service: Orthopedics;  Laterality: Right;    Current Outpatient Medications  Medication Sig Dispense Refill  . acetaminophen (TYLENOL) 500 MG tablet Take 1,000 mg by mouth every 6 (six) hours as needed for mild pain.    Marland Kitchen alendronate (FOSAMAX) 70 MG tablet TAKE 1 TABLET EVERY WEEK    . amiodarone (PACERONE) 200 MG tablet Take 0.5 tablets (100 mg total) by mouth daily. 15 tablet 6  .  apixaban (ELIQUIS) 5 MG TABS tablet Take 1 tablet (5 mg total) by mouth 2 (two) times daily. 60 tablet 6  . calcium-vitamin D (OSCAL WITH D) 500-200 MG-UNIT tablet Take 2 tablets by mouth daily with breakfast.    . cyanocobalamin (,VITAMIN B-12,) 1000 MCG/ML injection Inject 1,000 mcg into the muscle every 30 (thirty) days.      Marland Kitchen latanoprost (XALATAN) 0.005 % ophthalmic solution Place 1 drop into both eyes at bedtime.    . Multiple Vitamin (MULTIVITAMIN) tablet Take 1 tablet by mouth every morning.     . nadolol (CORGARD) 40 MG tablet TAKE 1  AND 1/2 TABLETS BY  MOUTH EVERY MORNING 135 tablet 0  . nitroGLYCERIN (NITROSTAT) 0.4 MG SL tablet Place 1 tablet (0.4 mg total) under the tongue every 5 (five) minutes as needed for chest pain. Up to 3 doses. If no relief after 3 rd dose, proceed to the ED 75 tablet 1  . omeprazole (PRILOSEC) 20 MG capsule Take 1 capsule (20 mg total) by mouth daily. 30 capsule 11   No current facility-administered medications for this visit.    Facility-Administered Medications Ordered in Other Visits  Medication Dose Route Frequency Provider Last Rate Last Dose  . bupivacaine liposome (EXPAREL) 1.3 % injection 266 mg  20 mL Infiltration Once [place], [employer], PA-C       Allergies:  Iodinated diagnostic agents and Naproxen   Social History: The patient  reports that  has never smoked. she has never used smokeless tobacco. She reports that she does not drink alcohol or use drugs.   ROS:  Please see the history of present illness. Otherwise, complete review of systems is positive for none.  All other systems are reviewed and negative.   Physical Exam: VS:  BP 129/81   Pulse 69   Ht 5\' 6"  (1.676 m)   Wt 185 lb 6.4 oz (84.1 kg)   BMI 29.92 kg/m , BMI Body mass index is 29.92 kg/m.  Wt Readings from Last 3 Encounters:  06/13/17 185 lb 6.4 oz (84.1 kg)  11/22/16 183 lb (83 kg)  09/22/16 183 lb 12.8 oz (83.4 kg)    General: Elderly woman, appears comfortable at rest. HEENT: Conjunctiva and lids normal, oropharynx clear. Neck: Supple, no elevated JVP or carotid bruits, no thyromegaly. Lungs: Clear to auscultation, nonlabored breathing at rest. Cardiac: Regular rate and rhythm, no S3 or significant systolic murmur. Abdomen: Soft, nontender, bowel sounds present. Extremities: Varicose veins, distal pulses 2+. Skin: Warm and dry. Musculoskeletal: No kyphosis. Neuropsychiatric: Alert and oriented x3, affect grossly appropriate.  ECG: I personally reviewed the tracing from 11/22/2016 which showed a  dual-chamber paced rhythm.  Recent Labwork:  March 2018: Hemoglobin 12.3, platelets 166, BUN 21, creatinine 0.75, potassium 4.6, AST 19, ALT 15, cholesterol 203, triglycerides 55, HDL 68, LDL 124  Other Studies Reviewed Today:  Lexiscan Myoview 08/01/2012: No perfusion evidence of scar or ischemia with LVEF 63%.  Assessment and Plan:  1. Paroxysmal atrial fibrillation, reports no progressive palpitations and had fairly low rhythm burden based on most recent device interrogation. She continued on low-dose amiodarone as well as Eliquis. Follow-up lab work pending per PCP. She does not report any bleeding problems.  2. Sick sinus syndrome with [employer] Jude pacemaker in place. Keep follow-up in the device clinic with Dr. Rayann Heman.  3. Essential hypertension, blood pressure is adequately controlled today.  Current medicines were reviewed with the patient today.  Disposition: Follow-up in 6 months.  Signed, Aloha Gell  Domenic Polite, MD, [facility] 06/13/2017 10:12 AM    [facility] at [place], [place], [place] [address] Phone: [phone]; Fax: [phone]

## 2017-06-25 ENCOUNTER — Ambulatory Visit (INDEPENDENT_AMBULATORY_CARE_PROVIDER_SITE_OTHER): Payer: Medicare Other | Admitting: *Deleted

## 2017-06-25 DIAGNOSIS — I480 Paroxysmal atrial fibrillation: Secondary | ICD-10-CM

## 2017-06-25 DIAGNOSIS — I495 Sick sinus syndrome: Secondary | ICD-10-CM

## 2017-06-26 NOTE — Progress Notes (Signed)
Remote pacemaker transmission.   

## 2017-06-27 LAB — CUP PACEART REMOTE DEVICE CHECK
Battery Remaining Longevity: 88 mo
Battery Voltage: 2.95 V
Brady Statistic AP VP Percent: 98 %
Brady Statistic AS VP Percent: 1.9 %
Brady Statistic RA Percent Paced: 87 %
Date Time Interrogation Session: 20190114070028
Implantable Lead Implant Date: 20031028
Implantable Lead Location: 753860
Implantable Pulse Generator Implant Date: 20140107
Lead Channel Impedance Value: 460 Ohm
Lead Channel Pacing Threshold Amplitude: 1 V
Lead Channel Pacing Threshold Pulse Width: 0.5 ms
Lead Channel Sensing Intrinsic Amplitude: 10.5 mV
Lead Channel Setting Pacing Amplitude: 2 V
MDC IDC LEAD IMPLANT DT: 20031028
MDC IDC LEAD LOCATION: 753859
MDC IDC MSMT BATTERY REMAINING PERCENTAGE: 91 %
MDC IDC MSMT LEADCHNL RA IMPEDANCE VALUE: 360 Ohm
MDC IDC MSMT LEADCHNL RA SENSING INTR AMPL: 0.3 mV
MDC IDC MSMT LEADCHNL RV PACING THRESHOLD AMPLITUDE: 0.75 V
MDC IDC MSMT LEADCHNL RV PACING THRESHOLD PULSEWIDTH: 0.5 ms
MDC IDC SET LEADCHNL RV PACING AMPLITUDE: 2.5 V
MDC IDC SET LEADCHNL RV PACING PULSEWIDTH: 0.5 ms
MDC IDC SET LEADCHNL RV SENSING SENSITIVITY: 2 mV
MDC IDC STAT BRADY AP VS PERCENT: 1 %
MDC IDC STAT BRADY AS VS PERCENT: 1 %
MDC IDC STAT BRADY RV PERCENT PACED: 99 %
Pulse Gen Model: 2210
Pulse Gen Serial Number: 7438567

## 2017-06-29 ENCOUNTER — Encounter: Payer: Self-pay | Admitting: Cardiology

## 2017-09-14 ENCOUNTER — Encounter: Payer: Self-pay | Admitting: *Deleted

## 2017-09-14 ENCOUNTER — Ambulatory Visit: Payer: Medicare Other | Admitting: Internal Medicine

## 2017-09-14 VITALS — BP 144/88 | HR 56 | Ht 66.0 in | Wt 185.0 lb

## 2017-09-14 DIAGNOSIS — I480 Paroxysmal atrial fibrillation: Secondary | ICD-10-CM | POA: Diagnosis not present

## 2017-09-14 DIAGNOSIS — I495 Sick sinus syndrome: Secondary | ICD-10-CM

## 2017-09-14 DIAGNOSIS — I10 Essential (primary) hypertension: Secondary | ICD-10-CM | POA: Diagnosis not present

## 2017-09-14 NOTE — Patient Instructions (Signed)
Medication Instructions:  Your physician recommends that you continue on your current medications as directed. Please refer to the Current Medication list given to you today.  Labwork: NONE  Testing/Procedures: NONE  Follow-Up: Your physician wants you to follow-up in: [place] DR. ALLRED. You will receive a reminder letter in the mail two months in advance. If you don't receive a letter, please call our office to schedule the follow-up appointment.  Any Other Special Instructions Will Be Listed Below (If Applicable).  If you need a refill on your cardiac medications before your next appointment, please call your pharmacy.

## 2017-09-14 NOTE — Progress Notes (Signed)
PCP: Dione Housekeeper, MD Primary Cardiologist:  Dr Domenic Polite Primary EP:  Dr Rayann Heman  [name] is a 84 y.o. female who presents today for routine electrophysiology followup.  Since last being seen in our clinic, the patient reports doing very well.  Today, she denies symptoms of palpitations, chest pain, shortness of breath,  lower extremity edema, dizziness, presyncope, or syncope.  The patient is otherwise without complaint today.   Past Medical History:  Diagnosis Date  . Arthritis   . Atrial flutter ([place])    s/p RFA 2003  . Breast cancer ([place])    1980's - lumpectomy only  . Colon polyps   . Diverticulosis   . DVT, lower extremity ([place]) 1998 and 1999   Bilateral  . Essential hypertension, benign   . GERD (gastroesophageal reflux disease)   . Glaucoma   . IBS (irritable bowel syndrome)   . Mixed hyperlipidemia   . Osteoporosis   . Persistent atrial fibrillation ([place])   . Pneumonia 2011  . Sick sinus syndrome ([facility])    s/p PPM ([facility])  . Urinary tract infection   . Varicose veins    Past Surgical History:  Procedure Laterality Date  . BREAST LUMPECTOMY  1980's   Right breast   . CARDIAC CATHETERIZATION  1999   Normal coronaries  . Cataracts Bilateral   . CHOLECYSTECTOMY    . COLONOSCOPY W/ BIOPSIES AND POLYPECTOMY  03/2003, 05/2008, 02/20/2011   severe diverticulosis, tubulovillous adenoma polyp, internal and external hemorrhoids 2012: severe diverticulosis, 4 mm polyp, hemorrhoids  . DILATION AND CURETTAGE OF UTERUS    . EYE SURGERY    . Fractured wrist Left 2013   repair  . INSERT / REPLACE / REMOVE PACEMAKER    . KNEE ARTHROSCOPY WITH MEDIAL MENISECTOMY Right 10/21/2014   Procedure: RIGHT KNEE ARTHROSCOPY WITH MEDIAL MENISECTOM,lateral menisectomy,synovectomy suprapatellar pouch;  Surgeon: Latanya Maudlin, MD;  [facility];  Service: Orthopedics;  Laterality: Right;  . PACEMAKER GENERATOR CHANGE  06/18/12   [facility] Acc[facility] DR RF, Dr Rayann Heman  . PACEMAKER  INSERTION  2003  . PERMANENT PACEMAKER GENERATOR CHANGE N/A 06/18/2012   Procedure: PERMANENT PACEMAKER GENERATOR CHANGE;  Surgeon: Thompson Grayer, MD;  Location: [facility] CATH LAB;  Service: Cardiovascular;  Laterality: N/A;  . TOTAL KNEE ARTHROPLASTY  12/20/2011   Procedure: TOTAL KNEE ARTHROPLASTY;  Surgeon: Tobi Bastos, MD;  [facility];  Service: Orthopedics;  Laterality: Left;  . TOTAL KNEE ARTHROPLASTY Right 02/10/2015   Procedure: TOTAL RIGHT KNEE ARTHROPLASTY;  Surgeon: Latanya Maudlin, MD;  [facility];  Service: Orthopedics;  Laterality: Right;    ROS- all systems are reviewed and negative except as per HPI above  Current Outpatient Medications  Medication Sig Dispense Refill  . acetaminophen (TYLENOL) 500 MG tablet Take 1,000 mg by mouth every 6 (six) hours as needed for mild pain.    Marland Kitchen alendronate (FOSAMAX) 70 MG tablet TAKE 1 TABLET EVERY WEEK    . amiodarone (PACERONE) 200 MG tablet Take 0.5 tablets (100 mg total) by mouth daily. 15 tablet 6  . apixaban (ELIQUIS) 5 MG TABS tablet Take 1 tablet (5 mg total) by mouth 2 (two) times daily. 60 tablet 6  . calcium-vitamin D (OSCAL WITH D) 500-200 MG-UNIT tablet Take 2 tablets by mouth daily with breakfast.    . cyanocobalamin (,VITAMIN B-12,) 1000 MCG/ML injection Inject 1,000 mcg into the muscle every 30 (thirty) days.      Marland Kitchen latanoprost (XALATAN) 0.005 % ophthalmic solution  Place 1 drop into both eyes at bedtime.    . Multiple Vitamin (MULTIVITAMIN) tablet Take 1 tablet by mouth every morning.     . nadolol (CORGARD) 40 MG tablet TAKE 1 AND 1/2 TABLETS BY  MOUTH EVERY MORNING 135 tablet 0  . nitroGLYCERIN (NITROSTAT) 0.4 MG SL tablet Place 1 tablet (0.4 mg total) under the tongue every 5 (five) minutes as needed for chest pain. Up to 3 doses. If no relief after 3 rd dose, proceed to the ED 75 tablet 1  . omeprazole (PRILOSEC) 20 MG capsule Take 1 capsule (20 mg total) by mouth daily. 30 capsule 11   No current  facility-administered medications for this visit.    Facility-Administered Medications Ordered in Other Visits  Medication Dose Route Frequency Provider Last Rate Last Dose  . bupivacaine liposome (EXPAREL) 1.3 % injection 266 mg  20 mL Infiltration Once Ardeen Jourdain, PA-C        Physical Exam: Vitals:   09/14/17 0948  BP: (!) 144/88  Pulse: (!) 56  SpO2: 91%  Weight: 185 lb (83.9 kg)  Height: 5\' 6"  (1.676 m)    GEN- The patient is elderly appearing, alert and oriented x 3 today.   Head- normocephalic, atraumatic Eyes-  Sclera clear, conjunctiva pink Ears- hearing intact Oropharynx- clear Lungs- Clear to ausculation bilaterally, normal work of breathing Chest- pacemaker pocket is well healed Heart- regular rate and rhythm  (paced) GI- soft, NT, ND, + BS Extremities- no clubbing, cyanosis, or edema  Pacemaker interrogation- reviewed in detail today,  See PACEART report   Assessment and Plan:  1. Symptomatic sinus bradycardia  Normal pacemaker function See [name] Art report She is undersensing afib No changes today If she remains in afib, will consider VVIR programming in the future  2. Persistent afib afib burden is 13% though this appears to be undersensing (27% last year)  chads2vasc score is 4. On eliquis I have offered to increase amiodarone to 200mg  daily. She does not wish to make any changes today  3. HTN Stable No change required today  Follow-up: merlin, return to see me in 1 year Follow-up with Dr Domenic Polite as scheduled  Thompson Grayer MD, [facility] 09/14/2017 10:13 AM

## 2017-09-24 ENCOUNTER — Ambulatory Visit (INDEPENDENT_AMBULATORY_CARE_PROVIDER_SITE_OTHER): Payer: Medicare Other | Admitting: *Deleted

## 2017-09-24 DIAGNOSIS — I495 Sick sinus syndrome: Secondary | ICD-10-CM

## 2017-09-24 NOTE — Progress Notes (Signed)
Remote pacemaker transmission.   

## 2017-09-26 ENCOUNTER — Encounter: Payer: Self-pay | Admitting: Cardiology

## 2017-09-27 LAB — CUP PACEART REMOTE DEVICE CHECK
Battery Remaining Longevity: 94 mo
Battery Remaining Percentage: 91 %
Battery Voltage: 2.95 V
Brady Statistic AP VP Percent: 76 %
Brady Statistic AP VS Percent: 1.9 %
Brady Statistic AS VP Percent: 22 %
Brady Statistic AS VS Percent: 1 %
Brady Statistic RA Percent Paced: 31 %
Brady Statistic RV Percent Paced: 98 %
Date Time Interrogation Session: 20190415071425
Implantable Lead Implant Date: 20031028
Implantable Lead Implant Date: 20031028
Implantable Lead Location: 753859
Implantable Lead Location: 753860
Implantable Pulse Generator Implant Date: 20140107
Lead Channel Impedance Value: 390 Ohm
Lead Channel Impedance Value: 480 Ohm
Lead Channel Pacing Threshold Amplitude: 0.75 V
Lead Channel Pacing Threshold Amplitude: 1 V
Lead Channel Pacing Threshold Pulse Width: 0.5 ms
Lead Channel Pacing Threshold Pulse Width: 0.5 ms
Lead Channel Sensing Intrinsic Amplitude: 0.2 mV
Lead Channel Sensing Intrinsic Amplitude: 10.5 mV
Lead Channel Setting Pacing Amplitude: 2 V
Lead Channel Setting Pacing Amplitude: 2.5 V
Lead Channel Setting Pacing Pulse Width: 0.5 ms
Lead Channel Setting Sensing Sensitivity: 2 mV
Pulse Gen Model: 2210
Pulse Gen Serial Number: 7438567

## 2017-10-29 ENCOUNTER — Other Ambulatory Visit: Payer: Self-pay | Admitting: Cardiology

## 2017-12-12 ENCOUNTER — Encounter: Payer: Self-pay | Admitting: *Deleted

## 2017-12-12 NOTE — Progress Notes (Signed)
Cardiology Office Note  Date: 12/14/2017   ID: BRIETTA MANSO, DOB 07-24-1928, MRN [id number]  PCP: Dione Housekeeper, MD  Primary Cardiologist: Rozann Lesches, MD   Chief Complaint  Patient presents with  . PAF    History of Present Illness: [place] B Blumenstein is an 84 y.o. female last seen in January.  She presents for a follow-up visit.  Reports no sense of palpitations.  Sometimes feels tired presumably when she is in atrial fibrillation, but states that this does not markedly inhibit her ADLs.  She enjoys getting outside in the morning in her garden and also tending her flowers.  She follows with Dr. Rayann Heman in the device clinic, [place] pacemaker in place.  Last device interrogation in April demonstrated 59% AT/AF burden.  She is on Eliquis for stroke prophylaxis and also low-dose amiodarone.  Follow-up lab work from May is outlined below.  I personally reviewed her ECG today which shows a ventricular paced rhythm.  Past Medical History:  Diagnosis Date  . Arthritis   . Atrial flutter ([place])    s/p RFA 2003  . Breast cancer ([place])    1980's - lumpectomy only  . Colon polyps   . Diverticulosis   . DVT, lower extremity ([place]) 1998 and 1999   Bilateral  . Essential hypertension, benign   . GERD (gastroesophageal reflux disease)   . Glaucoma   . IBS (irritable bowel syndrome)   . Mixed hyperlipidemia   . Osteoporosis   . Persistent atrial fibrillation ([place])   . Pneumonia 2011  . Sick sinus syndrome ([facility])    s/p PPM ([facility])  . Urinary tract infection   . Varicose veins     Past Surgical History:  Procedure Laterality Date  . BREAST LUMPECTOMY  1980's   Right breast   . CARDIAC CATHETERIZATION  1999   Normal coronaries  . Cataracts Bilateral   . CHOLECYSTECTOMY    . COLONOSCOPY W/ BIOPSIES AND POLYPECTOMY  03/2003, 05/2008, 02/20/2011   severe diverticulosis, tubulovillous adenoma polyp, internal and external hemorrhoids 2012: severe diverticulosis, 4 mm  polyp, hemorrhoids  . DILATION AND CURETTAGE OF UTERUS    . EYE SURGERY    . Fractured wrist Left 2013   repair  . INSERT / REPLACE / REMOVE PACEMAKER    . KNEE ARTHROSCOPY WITH MEDIAL MENISECTOMY Right 10/21/2014   Procedure: RIGHT KNEE ARTHROSCOPY WITH MEDIAL MENISECTOM,lateral menisectomy,synovectomy suprapatellar pouch;  Surgeon: Latanya Maudlin, MD;  [facility];  Service: Orthopedics;  Laterality: Right;  . PACEMAKER GENERATOR CHANGE  06/18/12   [facility]JM Acc[facility] DR RF, Dr Rayann Heman  . PACEMAKER INSERTION  2003  . PERMANENT PACEMAKER GENERATOR CHANGE N/A 06/18/2012   Procedure: PERMANENT PACEMAKER GENERATOR CHANGE;  Surgeon: Thompson Grayer, MD;  Location: [facility] CATH LAB;  Service: Cardiovascular;  Laterality: N/A;  . TOTAL KNEE ARTHROPLASTY  12/20/2011   Procedure: TOTAL KNEE ARTHROPLASTY;  Surgeon: Tobi Bastos, MD;  [facility];  Service: Orthopedics;  Laterality: Left;  . TOTAL KNEE ARTHROPLASTY Right 02/10/2015   Procedure: TOTAL RIGHT KNEE ARTHROPLASTY;  Surgeon: Latanya Maudlin, MD;  [facility];  Service: Orthopedics;  Laterality: Right;    Current Outpatient Medications  Medication Sig Dispense Refill  . acetaminophen (TYLENOL) 500 MG tablet Take 1,000 mg by mouth every 6 (six) hours as needed for mild pain.    Marland Kitchen alendronate (FOSAMAX) 70 MG tablet TAKE 1 TABLET EVERY WEEK    . amiodarone (PACERONE) 200 MG tablet Take 0.5 tablets (100  mg total) by mouth daily. 15 tablet 6  . apixaban (ELIQUIS) 5 MG TABS tablet Take 1 tablet (5 mg total) by mouth 2 (two) times daily. 60 tablet 6  . calcium-vitamin D (OSCAL WITH D) 500-200 MG-UNIT tablet Take 2 tablets by mouth daily with breakfast.    . cyanocobalamin (,VITAMIN B-12,) 1000 MCG/ML injection Inject 1,000 mcg into the muscle every 30 (thirty) days.      Marland Kitchen latanoprost (XALATAN) 0.005 % ophthalmic solution Place 1 drop into both eyes at bedtime.    . Multiple Vitamin (MULTIVITAMIN) tablet Take 1 tablet by mouth every morning.     .  nadolol (CORGARD) 40 MG tablet TAKE 1 AND 1/2 TABLETS DAILY 135 tablet 0  . nitroGLYCERIN (NITROSTAT) 0.4 MG SL tablet Place 1 tablet (0.4 mg total) under the tongue every 5 (five) minutes as needed for chest pain. Up to 3 doses. If no relief after 3 rd dose, proceed to the ED 75 tablet 1  . omeprazole (PRILOSEC) 20 MG capsule Take 1 capsule (20 mg total) by mouth daily. 30 capsule 11   No current facility-administered medications for this visit.    Facility-Administered Medications Ordered in Other Visits  Medication Dose Route Frequency Provider Last Rate Last Dose  . bupivacaine liposome (EXPAREL) 1.3 % injection 266 mg  20 mL Infiltration Once [place], [employer], PA-C       Allergies:  Iodinated diagnostic agents and Naproxen   Social History: The patient  reports that she has never smoked. She has never used smokeless tobacco. She reports that she does not drink alcohol or use drugs.   ROS:  Please see the history of present illness. Otherwise, complete review of systems is positive for none.  All other systems are reviewed and negative.   Physical Exam: VS:  BP 140/86   Pulse 70   Ht 5\' 6"  (1.676 m)   Wt 180 lb 12.8 oz (82 kg)   SpO2 98% Comment: on room air  BMI 29.18 kg/m , BMI Body mass index is 29.18 kg/m.  Wt Readings from Last 3 Encounters:  12/14/17 180 lb 12.8 oz (82 kg)  09/14/17 185 lb (83.9 kg)  06/13/17 185 lb 6.4 oz (84.1 kg)    General: Elderly woman, appears comfortable at rest. HEENT: Conjunctiva and lids normal, oropharynx clear. Neck: Supple, no elevated JVP or carotid bruits, no thyromegaly. Lungs: Clear to auscultation, nonlabored breathing at rest. Cardiac: Regular rate and rhythm, no S3 or significant systolic murmur, no pericardial rub. Abdomen: Soft, nontender, bowel sounds present. Extremities: Varicose veins, distal pulses 2+. Skin: Warm and dry.  Musculoskeletal: No kyphosis. Neuropsychiatric: Alert and oriented x3, affect grossly  appropriate.  ECG: I personally reviewed the tracing from 11/22/2016 which showed a dual-chamber paced rhythm.  Recent Labwork:  May 2019: Hemoglobin 11.5, platelets 150, BUN 12, creatinine 0.76, potassium 4.4, AST 19, ALT 15, cholesterol 187, triglycerides 68, HDL 56, LDL 117  Other Studies Reviewed Today:  Lexiscan Myoview 08/01/2012: No perfusion evidence of scar or ischemia with LVEF 63%.  Assessment and Plan:  1.  Paroxysmal atrial fibrillation.  Plan to continue amiodarone and Eliquis.  Recent lab work reviewed.  2.  Sick sinus syndrome with [employer]. Jude pacemaker in place.  She continues to follow with Dr. Rayann Heman.  3.  Essential hypertension.  Keep follow-up with Dr. Edrick Oh.  Current medicines were reviewed with the patient today.   Orders Placed This Encounter  Procedures  . EKG 12-Lead    Disposition: Follow-up  in 6 months.  Signed, Satira Sark, MD, [facility] 12/14/2017 11:08 AM    [facility] at [place], [place], [place] [address] Phone: [phone]; Fax: [phone]

## 2017-12-14 ENCOUNTER — Ambulatory Visit: Payer: Medicare Other | Admitting: Cardiology

## 2017-12-14 ENCOUNTER — Encounter: Payer: Self-pay | Admitting: *Deleted

## 2017-12-14 VITALS — BP 140/86 | HR 70 | Ht 66.0 in | Wt 180.8 lb

## 2017-12-14 DIAGNOSIS — I4819 Other persistent atrial fibrillation: Secondary | ICD-10-CM

## 2017-12-14 DIAGNOSIS — I495 Sick sinus syndrome: Secondary | ICD-10-CM

## 2017-12-14 DIAGNOSIS — I10 Essential (primary) hypertension: Secondary | ICD-10-CM | POA: Diagnosis not present

## 2017-12-14 DIAGNOSIS — I481 Persistent atrial fibrillation: Secondary | ICD-10-CM | POA: Diagnosis not present

## 2017-12-14 NOTE — Patient Instructions (Signed)

## 2017-12-24 ENCOUNTER — Ambulatory Visit (INDEPENDENT_AMBULATORY_CARE_PROVIDER_SITE_OTHER): Payer: Medicare Other | Admitting: *Deleted

## 2017-12-24 DIAGNOSIS — I495 Sick sinus syndrome: Secondary | ICD-10-CM | POA: Diagnosis not present

## 2017-12-24 NOTE — Progress Notes (Signed)
Remote pacemaker transmission.   

## 2017-12-25 LAB — CUP PACEART REMOTE DEVICE CHECK
Battery Remaining Longevity: 92 mo
Battery Remaining Percentage: 91 %
Battery Voltage: 2.95 V
Brady Statistic RA Percent Paced: 30 %
Date Time Interrogation Session: 20190715075103
Implantable Lead Implant Date: 20031028
Implantable Lead Location: 753859
Implantable Lead Location: 753860
Lead Channel Impedance Value: 380 Ohm
Lead Channel Impedance Value: 460 Ohm
Lead Channel Pacing Threshold Amplitude: 1 V
Lead Channel Pacing Threshold Pulse Width: 0.5 ms
Lead Channel Sensing Intrinsic Amplitude: 0.2 mV
Lead Channel Setting Pacing Amplitude: 2 V
MDC IDC LEAD IMPLANT DT: 20031028
MDC IDC MSMT LEADCHNL RV PACING THRESHOLD AMPLITUDE: 0.75 V
MDC IDC MSMT LEADCHNL RV PACING THRESHOLD PULSEWIDTH: 0.5 ms
MDC IDC MSMT LEADCHNL RV SENSING INTR AMPL: 10.4 mV
MDC IDC PG IMPLANT DT: 20140107
MDC IDC SET LEADCHNL RV PACING AMPLITUDE: 2.5 V
MDC IDC SET LEADCHNL RV PACING PULSEWIDTH: 0.5 ms
MDC IDC SET LEADCHNL RV SENSING SENSITIVITY: 2 mV
MDC IDC STAT BRADY AP VP PERCENT: 78 %
MDC IDC STAT BRADY AP VS PERCENT: 3.5 %
MDC IDC STAT BRADY AS VP PERCENT: 18 %
MDC IDC STAT BRADY AS VS PERCENT: 1 %
MDC IDC STAT BRADY RV PERCENT PACED: 98 %
Pulse Gen Model: 2210
Pulse Gen Serial Number: 7438567

## 2017-12-26 ENCOUNTER — Encounter: Payer: Self-pay | Admitting: Cardiology

## 2018-03-25 ENCOUNTER — Ambulatory Visit (INDEPENDENT_AMBULATORY_CARE_PROVIDER_SITE_OTHER): Payer: Medicare Other | Admitting: *Deleted

## 2018-03-25 DIAGNOSIS — I495 Sick sinus syndrome: Secondary | ICD-10-CM | POA: Diagnosis not present

## 2018-03-25 NOTE — Progress Notes (Signed)
Remote pacemaker transmission.   

## 2018-03-28 ENCOUNTER — Encounter: Payer: Self-pay | Admitting: Cardiology

## 2018-04-24 LAB — CUP PACEART REMOTE DEVICE CHECK
Battery Remaining Longevity: 82 mo
Brady Statistic AP VP Percent: 71 %
Brady Statistic AP VS Percent: 2.6 %
Brady Statistic AS VP Percent: 26 %
Brady Statistic AS VS Percent: 1 %
Brady Statistic RA Percent Paced: 39 %
Brady Statistic RV Percent Paced: 98 %
Date Time Interrogation Session: 20191014062341
Implantable Lead Implant Date: 20031028
Implantable Lead Location: 753859
Implantable Lead Location: 753860
Lead Channel Impedance Value: 360 Ohm
Lead Channel Impedance Value: 440 Ohm
Lead Channel Pacing Threshold Amplitude: 0.75 V
Lead Channel Pacing Threshold Pulse Width: 0.5 ms
Lead Channel Sensing Intrinsic Amplitude: 11.3 mV
Lead Channel Setting Pacing Amplitude: 2 V
Lead Channel Setting Pacing Pulse Width: 0.5 ms
Lead Channel Setting Sensing Sensitivity: 2 mV
MDC IDC LEAD IMPLANT DT: 20031028
MDC IDC MSMT BATTERY REMAINING PERCENTAGE: 81 %
MDC IDC MSMT BATTERY VOLTAGE: 2.93 V
MDC IDC MSMT LEADCHNL RA PACING THRESHOLD AMPLITUDE: 1 V
MDC IDC MSMT LEADCHNL RA SENSING INTR AMPL: 0.2 mV
MDC IDC MSMT LEADCHNL RV PACING THRESHOLD PULSEWIDTH: 0.5 ms
MDC IDC PG IMPLANT DT: 20140107
MDC IDC SET LEADCHNL RV PACING AMPLITUDE: 2.5 V
Pulse Gen Model: 2210
Pulse Gen Serial Number: 7438567

## 2018-04-25 ENCOUNTER — Encounter (HOSPITAL_COMMUNITY): Payer: Self-pay | Admitting: Emergency Medicine

## 2018-04-25 ENCOUNTER — Emergency Department (HOSPITAL_COMMUNITY)
Admission: EM | Admit: 2018-04-25 | Discharge: 2018-04-25 | Disposition: A | Discharge status: Home / Self Care | Payer: Medicare Other | Attending: Emergency Medicine | Admitting: Emergency Medicine

## 2018-04-25 ENCOUNTER — Other Ambulatory Visit: Payer: Self-pay

## 2018-04-25 ENCOUNTER — Emergency Department (HOSPITAL_COMMUNITY): Payer: Medicare Other

## 2018-04-25 DIAGNOSIS — Z950 Presence of cardiac pacemaker: Secondary | ICD-10-CM | POA: Diagnosis not present

## 2018-04-25 DIAGNOSIS — Y9301 Activity, walking, marching and hiking: Secondary | ICD-10-CM | POA: Insufficient documentation

## 2018-04-25 DIAGNOSIS — Y929 Unspecified place or not applicable: Secondary | ICD-10-CM | POA: Insufficient documentation

## 2018-04-25 DIAGNOSIS — S20229A Contusion of unspecified back wall of thorax, initial encounter: Secondary | ICD-10-CM | POA: Insufficient documentation

## 2018-04-25 DIAGNOSIS — W010XXA Fall on same level from slipping, tripping and stumbling without subsequent striking against object, initial encounter: Secondary | ICD-10-CM | POA: Insufficient documentation

## 2018-04-25 DIAGNOSIS — I10 Essential (primary) hypertension: Secondary | ICD-10-CM | POA: Insufficient documentation

## 2018-04-25 DIAGNOSIS — W19XXXA Unspecified fall, initial encounter: Secondary | ICD-10-CM

## 2018-04-25 DIAGNOSIS — Z79899 Other long term (current) drug therapy: Secondary | ICD-10-CM | POA: Insufficient documentation

## 2018-04-25 DIAGNOSIS — Z96653 Presence of artificial knee joint, bilateral: Secondary | ICD-10-CM | POA: Insufficient documentation

## 2018-04-25 DIAGNOSIS — Z7901 Long term (current) use of anticoagulants: Secondary | ICD-10-CM | POA: Insufficient documentation

## 2018-04-25 DIAGNOSIS — Y999 Unspecified external cause status: Secondary | ICD-10-CM | POA: Insufficient documentation

## 2018-04-25 DIAGNOSIS — S299XXA Unspecified injury of thorax, initial encounter: Secondary | ICD-10-CM | POA: Diagnosis present

## 2018-04-25 MED ORDER — OXYCODONE-ACETAMINOPHEN 5-325 MG PO TABS
1.0000 | ORAL_TABLET | Freq: Once | ORAL | Status: AC
  Administered 2018-04-25: 1 via ORAL
  Filled 2018-04-25: qty 1

## 2018-04-25 MED ORDER — OXYCODONE-ACETAMINOPHEN 5-325 MG PO TABS: 1.0000 | ORAL_TABLET | Freq: Four times a day (QID) | ORAL | 0 refills | Status: DC | PRN

## 2018-04-25 NOTE — ED Triage Notes (Signed)
Pt reports fall while outside today. States she stumbled and that what cause the fall. Pt c/o back pain, beneath RT shoulder blade. Pt hit RT side on house. Pt on eliquis for A.Fib. Denies LOC or hitting head.

## 2018-04-25 NOTE — ED Provider Notes (Signed)
[facility] EMERGENCY DEPARTMENT Provider Note   CSN: [id number] Arrival date & time: 04/25/18  [place]     History   Chief Complaint Chief Complaint  Patient presents with  . Fall    HPI [name] is a 84 y.o. female.  Patient states that she slipped and fell and complains of pain near her right scapula.  Patient did not hit her head and is not complaining of neck pain  The history is provided by the patient. No language interpreter was used.  Fall  This is a new problem. The current episode started 3 to 5 hours ago. The problem occurs rarely. The problem has been resolved. Pertinent negatives include no chest pain, no abdominal pain and no headaches. Exacerbated by: Movement. Nothing relieves the symptoms. She has tried nothing for the symptoms. The treatment provided no relief.    Past Medical History:  Diagnosis Date  . Arthritis   . Atrial flutter ([place])    s/p RFA 2003  . Breast cancer ([place])    1980's - lumpectomy only  . Colon polyps   . Diverticulosis   . DVT, lower extremity ([place]) 1998 and 1999   Bilateral  . Essential hypertension, benign   . GERD (gastroesophageal reflux disease)   . Glaucoma   . IBS (irritable bowel syndrome)   . Mixed hyperlipidemia   . Osteoporosis   . Persistent atrial fibrillation   . Pneumonia 2011  . Sick sinus syndrome (HCC)    s/p PPM (SJM)  . Urinary tract infection   . Varicose veins     Patient Active Problem List   Diagnosis Date Noted  . Atypical chest pain 09/01/2015  . Esophageal reflux 09/01/2015  . Abdominal pain, epigastric 09/01/2015  . Persistent atrial fibrillation 09/01/2015  . Chronic anticoagulation 09/01/2015  . History of total knee arthroplasty 02/10/2015  . Chest pain 04/18/2014  . [place] anemia 12/08/2013  . A-fib ([place]) 12/08/2013  . Sick sinus syndrome ([place]) 03/22/2011  . Hyperlipidemia 02/02/2009  . Essential hypertension, benign 02/02/2009  . Paroxysmal atrial fibrillation ([place])  05/15/2008  . COLONIC POLYPS, ADENOMATOUS, HX OF 05/15/2008    Past Surgical History:  Procedure Laterality Date  . BREAST LUMPECTOMY  1980's   Right breast   . CARDIAC CATHETERIZATION  1999   Normal coronaries  . Cataracts Bilateral   . CHOLECYSTECTOMY    . COLONOSCOPY W/ BIOPSIES AND POLYPECTOMY  03/2003, 05/2008, 02/20/2011   severe diverticulosis, tubulovillous adenoma polyp, internal and external hemorrhoids 2012: severe diverticulosis, 4 mm polyp, hemorrhoids  . DILATION AND CURETTAGE OF UTERUS    . EYE SURGERY    . Fractured wrist Left 2013   repair  . INSERT / REPLACE / REMOVE PACEMAKER    . KNEE ARTHROSCOPY WITH MEDIAL MENISECTOMY Right 10/21/2014   Procedure: RIGHT KNEE ARTHROSCOPY WITH MEDIAL MENISECTOM,lateral menisectomy,synovectomy suprapatellar pouch;  Surgeon: Latanya Maudlin, MD;  [facility]: [facility];  Service: Orthopedics;  Laterality: Right;  . PACEMAKER GENERATOR CHANGE  06/18/12   [facility] DR RF, Dr Rayann Heman  . PACEMAKER INSERTION  2003  . PERMANENT PACEMAKER GENERATOR CHANGE N/A 06/18/2012   Procedure: PERMANENT PACEMAKER GENERATOR CHANGE;  Surgeon: Thompson Grayer, MD;  Location: [facility] LAB;  Service: Cardiovascular;  Laterality: N/A;  . TOTAL KNEE ARTHROPLASTY  12/20/2011   Procedure: TOTAL KNEE ARTHROPLASTY;  Surgeon: Tobi Bastos, MD;  [facility]: [facility];  Service: Orthopedics;  Laterality: Left;  . TOTAL KNEE ARTHROPLASTY Right 02/10/2015   Procedure: TOTAL RIGHT KNEE  ARTHROPLASTY;  Surgeon: Latanya Maudlin, MD;  [facility]: [facility];  Service: Orthopedics;  Laterality: Right;     OB History   None      Home Medications    Prior to Admission medications   Medication Sig Start Date End Date Taking? Authorizing Provider  acetaminophen (TYLENOL) 500 MG tablet Take 1,000 mg by mouth every 6 (six) hours as needed for mild pain.    [provider]  alendronate (FOSAMAX) 70 MG tablet TAKE 1 TABLET EVERY WEEK 09/14/16   [provider]  amiodarone  (PACERONE) 200 MG tablet Take 0.5 tablets (100 mg total) by mouth daily. 07/07/16   Satira Sark, MD  apixaban (ELIQUIS) 5 MG TABS tablet Take 1 tablet (5 mg total) by mouth 2 (two) times daily. 07/07/16   Satira Sark, MD  calcium-vitamin D (OSCAL WITH D) 500-200 MG-UNIT tablet Take 2 tablets by mouth daily with breakfast.    [provider]  cyanocobalamin (,VITAMIN B-12,) 1000 MCG/ML injection Inject 1,000 mcg into the muscle every 30 (thirty) days.      [provider]  latanoprost (XALATAN) 0.005 % ophthalmic solution Place 1 drop into both eyes at bedtime.    [provider]  Multiple Vitamin (MULTIVITAMIN) tablet Take 1 tablet by mouth every morning.     [provider]  nadolol (CORGARD) 40 MG tablet TAKE 1 AND 1/2 TABLETS DAILY 10/29/17   Satira Sark, MD  nitroGLYCERIN (NITROSTAT) 0.4 MG SL tablet Place 1 tablet (0.4 mg total) under the tongue every 5 (five) minutes as needed for chest pain. Up to 3 doses. If no relief after 3 rd dose, proceed to the ED 12/25/16   Satira Sark, MD  omeprazole (PRILOSEC) 20 MG capsule Take 1 capsule (20 mg total) by mouth daily. 09/14/15   Gatha Mayer, MD    Family History Family History  Problem Relation Age of Onset  . Cancer Father   . Stroke Mother   . Colon cancer Brother   . Colon cancer Other        Grandfather  . Heart attack Unknown        CHF grandmother  . Alzheimer's disease Sister   . Colon cancer Maternal Grandfather     Social History Social History   Tobacco Use  . Smoking status: Never Smoker  . Smokeless tobacco: Never Used  Substance Use Topics  . Alcohol use: No    Alcohol/week: 0.0 standard drinks  . Drug use: No     Allergies   Iodinated diagnostic agents and Naproxen   Review of Systems Review of Systems  Constitutional: Negative for appetite change and fatigue.  HENT: Negative for congestion, ear discharge and sinus pressure.   Eyes: Negative for  discharge.  Respiratory: Negative for cough.   Cardiovascular: Negative for chest pain.  Gastrointestinal: Negative for abdominal pain and diarrhea.  Genitourinary: Negative for frequency and hematuria.  Musculoskeletal: Positive for back pain.  Skin: Negative for rash.  Neurological: Negative for seizures and headaches.  Psychiatric/Behavioral: Negative for hallucinations.     Physical Exam Updated Vital Signs BP (!) 168/89 (BP Location: Right Arm)   Pulse 82   Temp 98.1 F (36.7 C) (Oral)   Resp 16   Ht 5\' 6"  (1.676 m)   Wt 81.6 kg   SpO2 100%   BMI 29.05 kg/m   Physical Exam  Constitutional: She is oriented to person, place, and time. She appears well-developed.  HENT:  Head:  Normocephalic.  Eyes: Conjunctivae and EOM are normal. No scleral icterus.  Neck: Neck supple. No thyromegaly present.  Cardiovascular: Normal rate and regular rhythm. Exam reveals no gallop and no friction rub.  No murmur heard. Pulmonary/Chest: No stridor. She has no wheezes. She has no rales. She exhibits no tenderness.  Abdominal: She exhibits no distension. There is no tenderness. There is no rebound.  Musculoskeletal: Normal range of motion. She exhibits no edema.  Tenderness to upper back near scapula on the right side  Lymphadenopathy:    She has no cervical adenopathy.  Neurological: She is oriented to person, place, and time. She exhibits normal muscle tone. Coordination normal.  Skin: No rash noted. No erythema.  Psychiatric: She has a normal mood and affect. Her behavior is normal.     ED Treatments / Results  Labs (all labs ordered are listed, but only abnormal results are displayed) Labs Reviewed - No data to display  EKG None  Radiology No results found.  Procedures Procedures (including critical care time)  Medications Ordered in ED Medications  oxyCODONE-acetaminophen (PERCOCET/ROXICET) 5-325 MG per tablet 1 tablet (1 tablet Oral Given 04/25/18 2006)      Initial Impression / Assessment and Plan / ED Course  I have reviewed the triage vital signs and the nursing notes.  Pertinent labs & imaging results that were available during my care of the patient were reviewed by me and considered in my medical decision making (see chart for details).    CT scan negative.  Diagnosis contusion to upper back.  Patient was given pain medicine and will follow-up with her doctor  Final Clinical Impressions(s) / ED Diagnoses   Final diagnoses:  None    ED Discharge Orders    None       Milton Ferguson, MD 04/25/18 2209

## 2018-04-25 NOTE — Discharge Instructions (Addendum)
Follow-up with your doctor next week for recheck if any problems 

## 2018-06-20 ENCOUNTER — Encounter: Payer: Self-pay | Admitting: *Deleted

## 2018-06-20 NOTE — Progress Notes (Signed)
Cardiology Office Note  Date: 06/21/2018   ID: [name], DOB 11-07-1928, MRN [id number]  PCP: [name] Housekeeper, MD  Primary Cardiologist: Rozann Lesches, MD   Chief Complaint  Patient presents with  . Atrial Fibrillation    History of Present Illness: [name] is an 85 y.o. female last seen in July 2019.  She is here for a routine follow-up visit.  She does not report any major progressive trend in palpitations.  States that she has good days and bad days, but generally feels well.  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  She sees Dr. Rayann Heman in the device clinic, [place] pacemaker in place.  I reviewed her medications which are outlined below and stable from a cardiac perspective.  She does not report any spontaneous bleeding problems on Eliquis.  I did review lab work done by Dr. Edrick Oh back in September 2019 as reviewed below.  Past Medical History:  Diagnosis Date  . Arthritis   . Atrial flutter ([place])    s/p RFA 2003  . Breast cancer ([place])    1980's - lumpectomy only  . Colon polyps   . Diverticulosis   . DVT, lower extremity ([place]) 1998 and 1999   Bilateral  . Essential hypertension, benign   . GERD (gastroesophageal reflux disease)   . Glaucoma   . IBS (irritable bowel syndrome)   . Mixed hyperlipidemia   . Osteoporosis   . Persistent atrial fibrillation   . Pneumonia 2011  . Sick sinus syndrome (HCC)    s/p PPM (SJM)  . Urinary tract infection   . Varicose veins     Past Surgical History:  Procedure Laterality Date  . BREAST LUMPECTOMY  1980's   Right breast   . CARDIAC CATHETERIZATION  1999   Normal coronaries  . Cataracts Bilateral   . CHOLECYSTECTOMY    . COLONOSCOPY W/ BIOPSIES AND POLYPECTOMY  03/2003, 05/2008, 02/20/2011   severe diverticulosis, tubulovillous adenoma polyp, internal and external hemorrhoids 2012: severe diverticulosis, 4 mm polyp, hemorrhoids  . DILATION AND  CURETTAGE OF UTERUS    . EYE SURGERY    . Fractured wrist Left 2013   repair  . INSERT / REPLACE / REMOVE PACEMAKER    . KNEE ARTHROSCOPY WITH MEDIAL MENISECTOMY Right 10/21/2014   Procedure: RIGHT KNEE ARTHROSCOPY WITH MEDIAL MENISECTOM,lateral menisectomy,synovectomy suprapatellar pouch;  Surgeon: Latanya Maudlin, MD;  [facility];  Service: Orthopedics;  Laterality: Right;  . PACEMAKER GENERATOR CHANGE  06/18/12   [facility] DR RF, Dr Rayann Heman  . PACEMAKER INSERTION  2003  . PERMANENT PACEMAKER GENERATOR CHANGE N/A 06/18/2012   Procedure: PERMANENT PACEMAKER GENERATOR CHANGE;  Surgeon: Thompson Grayer, MD;  Location: [facility] CATH LAB;  Service: Cardiovascular;  Laterality: N/A;  . TOTAL KNEE ARTHROPLASTY  12/20/2011   Procedure: TOTAL KNEE ARTHROPLASTY;  Surgeon: Tobi Bastos, MD;  [facility];  Service: Orthopedics;  Laterality: Left;  . TOTAL KNEE ARTHROPLASTY Right 02/10/2015   Procedure: TOTAL RIGHT KNEE ARTHROPLASTY;  Surgeon: Latanya Maudlin, MD;  [facility];  Service: Orthopedics;  Laterality: Right;    Current Outpatient Medications  Medication Sig Dispense Refill  . acetaminophen (TYLENOL) 500 MG tablet Take 1,000 mg by mouth every 6 (six) hours as needed for mild pain.    Marland Kitchen alendronate (FOSAMAX) 70 MG tablet TAKE 1 TABLET EVERY WEEK    . amiodarone (PACERONE) 200 MG tablet Take 0.5 tablets (100 mg  total) by mouth daily. 15 tablet 6  . apixaban (ELIQUIS) 5 MG TABS tablet Take 1 tablet (5 mg total) by mouth 2 (two) times daily. 60 tablet 6  . calcium-vitamin D (OSCAL WITH D) 500-200 MG-UNIT tablet Take 2 tablets by mouth daily with breakfast.    . cyanocobalamin (,VITAMIN B-12,) 1000 MCG/ML injection Inject 1,000 mcg into the muscle every 30 (thirty) days.      Marland Kitchen latanoprost (XALATAN) 0.005 % ophthalmic solution Place 1 drop into both eyes at bedtime.    . Multiple Vitamin (MULTIVITAMIN) tablet Take 1 tablet by mouth every morning.     . nadolol (CORGARD) 40 MG tablet TAKE 1  AND 1/2 TABLETS DAILY 135 tablet 0  . nitroGLYCERIN (NITROSTAT) 0.4 MG SL tablet Place 1 tablet (0.4 mg total) under the tongue every 5 (five) minutes as needed for chest pain. Up to 3 doses. If no relief after 3 rd dose, proceed to the ED 75 tablet 1  . omeprazole (PRILOSEC) 20 MG capsule Take 1 capsule (20 mg total) by mouth daily. 30 capsule 11   No current facility-administered medications for this visit.    Facility-Administered Medications Ordered in Other Visits  Medication Dose Route Frequency Provider Last Rate Last Dose  . bupivacaine liposome (EXPAREL) 1.3 % injection 266 mg  20 mL Infiltration Once [place], [employer], PA-C       Allergies:  Iodinated diagnostic agents and Naproxen   Social History: The patient  reports that she has never smoked. She has never used smokeless tobacco. She reports that she does not drink alcohol or use drugs.   ROS:  Please see the history of present illness. Otherwise, complete review of systems is positive for occasional ankle edema.  All other systems are reviewed and negative.   Physical Exam: VS:  BP 122/64   Pulse 72   Ht 5\' 6"  (1.676 m)   Wt 178 lb 9.6 oz (81 kg)   BMI 28.83 kg/m , BMI Body mass index is 28.83 kg/m.  Wt Readings from Last 3 Encounters:  06/21/18 178 lb 9.6 oz (81 kg)  04/25/18 180 lb (81.6 kg)  12/14/17 180 lb 12.8 oz (82 kg)    General: Elderly woman, appears comfortable at rest. HEENT: Conjunctiva and lids normal, oropharynx clear. Neck: Supple, no elevated JVP or carotid bruits, no thyromegaly. Lungs: Clear to auscultation, nonlabored breathing at rest. Cardiac: Regular rate and rhythm, no S3 or significant systolic murmur. Abdomen: Soft, nontender, bowel sounds present. Extremities: Mild ankle edema, varicose veins, distal pulses 2+. Skin: Warm and dry. Musculoskeletal: No kyphosis. Neuropsychiatric: Alert and oriented x3, affect grossly appropriate.  ECG: I personally reviewed the tracing from 12/14/2017  which shows a ventricular paced rhythm.  Recent Labwork:  September 2019: Hemoglobin 12.7, platelets 163, BUN 9, creatinine 0.92, potassium 4.3, AST 19, ALT 14, cholesterol 187, triglycerides 94, HDL 49, LDL 119  Other Studies Reviewed Today:  Lexiscan Myoview 08/01/2012: No perfusion evidence of scar or ischemia with LVEF 63%.  Assessment and Plan:  1.  Paroxysmal atrial fibrillation.  Symptomatically stable.  We will continue Eliquis and amiodarone as before.  Lab work from September 2019 reviewed.  2.  Sick sinus syndrome, [place] pacemaker in place with follow-up per Dr. Rayann Heman.  3.  Essential hypertension, blood pressure is well controlled today.  4.  Intermittent mild ankle edema and also varicose veins.  She has been symptomatically stable, not on diuretic.  Current medicines were reviewed with the patient today.  Disposition: Follow-up in 6 months.  Signed, Satira Sark, MD, [facility] 06/21/2018 11:56 AM    [facility] [place], [place], [place] [address] Phone: [phone]; Fax: [phone]

## 2018-06-21 ENCOUNTER — Encounter: Payer: Self-pay | Admitting: Cardiology

## 2018-06-21 ENCOUNTER — Ambulatory Visit: Payer: Medicare Other | Admitting: Cardiology

## 2018-06-21 VITALS — BP 122/64 | HR 72 | Ht 66.0 in | Wt 178.6 lb

## 2018-06-21 DIAGNOSIS — I480 Paroxysmal atrial fibrillation: Secondary | ICD-10-CM | POA: Diagnosis not present

## 2018-06-21 DIAGNOSIS — I8393 Asymptomatic varicose veins of bilateral lower extremities: Secondary | ICD-10-CM

## 2018-06-21 DIAGNOSIS — I495 Sick sinus syndrome: Secondary | ICD-10-CM

## 2018-06-21 DIAGNOSIS — I10 Essential (primary) hypertension: Secondary | ICD-10-CM

## 2018-06-21 NOTE — Patient Instructions (Addendum)

## 2018-06-24 ENCOUNTER — Ambulatory Visit (INDEPENDENT_AMBULATORY_CARE_PROVIDER_SITE_OTHER): Payer: Medicare Other

## 2018-06-24 DIAGNOSIS — I480 Paroxysmal atrial fibrillation: Secondary | ICD-10-CM

## 2018-06-24 DIAGNOSIS — I495 Sick sinus syndrome: Secondary | ICD-10-CM

## 2018-06-24 NOTE — Progress Notes (Signed)
Remote pacemaker transmission.   

## 2018-06-26 LAB — CUP PACEART REMOTE DEVICE CHECK
Battery Remaining Percentage: 81 %
Brady Statistic AP VS Percent: 2.4 %
Brady Statistic RV Percent Paced: 98 %
Date Time Interrogation Session: 20200113085656
Implantable Lead Location: 753859
Lead Channel Pacing Threshold Amplitude: 0.75 V
Lead Channel Sensing Intrinsic Amplitude: 0.2 mV
Lead Channel Sensing Intrinsic Amplitude: 11.7 mV
Lead Channel Setting Pacing Amplitude: 2 V
Lead Channel Setting Pacing Pulse Width: 0.5 ms
Lead Channel Setting Sensing Sensitivity: 2 mV
MDC IDC LEAD IMPLANT DT: 20031028
MDC IDC LEAD IMPLANT DT: 20031028
MDC IDC LEAD LOCATION: 753860
MDC IDC MSMT BATTERY REMAINING LONGEVITY: 80 mo
MDC IDC MSMT BATTERY VOLTAGE: 2.93 V
MDC IDC MSMT LEADCHNL RA IMPEDANCE VALUE: 360 Ohm
MDC IDC MSMT LEADCHNL RA PACING THRESHOLD AMPLITUDE: 1 V
MDC IDC MSMT LEADCHNL RA PACING THRESHOLD PULSEWIDTH: 0.5 ms
MDC IDC MSMT LEADCHNL RV IMPEDANCE VALUE: 450 Ohm
MDC IDC MSMT LEADCHNL RV PACING THRESHOLD PULSEWIDTH: 0.5 ms
MDC IDC PG IMPLANT DT: 20140107
MDC IDC SET LEADCHNL RV PACING AMPLITUDE: 2.5 V
MDC IDC STAT BRADY AP VP PERCENT: 72 %
MDC IDC STAT BRADY AS VP PERCENT: 25 %
MDC IDC STAT BRADY AS VS PERCENT: 1 %
MDC IDC STAT BRADY RA PERCENT PACED: 47 %
Pulse Gen Model: 2210
Pulse Gen Serial Number: 7438567

## 2018-09-06 ENCOUNTER — Encounter: Payer: Self-pay | Admitting: Internal Medicine

## 2018-09-07 ENCOUNTER — Telehealth: Payer: Self-pay

## 2018-09-07 NOTE — Telephone Encounter (Signed)
Left message regarding upcoming virtual visit appt with Dr. Rayann Heman. Advise pt to give [place] a call if she has any questions or concerns.

## 2018-09-09 ENCOUNTER — Telehealth (INDEPENDENT_AMBULATORY_CARE_PROVIDER_SITE_OTHER): Payer: Medicare Other | Admitting: Internal Medicine

## 2018-09-09 DIAGNOSIS — I4819 Other persistent atrial fibrillation: Secondary | ICD-10-CM | POA: Diagnosis not present

## 2018-09-09 DIAGNOSIS — I495 Sick sinus syndrome: Secondary | ICD-10-CM

## 2018-09-09 DIAGNOSIS — I10 Essential (primary) hypertension: Secondary | ICD-10-CM

## 2018-09-09 NOTE — Progress Notes (Signed)
Electrophysiology TeleHealth Note   Due to national recommendations of social distancing due to [place] 19, an audio telehealth visit is felt to be most appropriate for this patient at this time.  Verbal consent was obtained by me today.   Date:  09/09/2018   ID:  [name], DOB [birth year], MRN [id number]  Location: patient's home  Provider location: [address] [place]  Evaluation Performed: Follow-up visit  PCP:  Dione Housekeeper, MD  Cardiologist:  Rozann Lesches, MD Electrophysiologist:  Dr Rayann Heman  Chief Complaint:  afib  History of Present Illness:    [name] is a 85 y.o. female who presents via audio/video conferencing for a telehealth visit today.  Since last being seen in our clinic, the patient reports doing very well.  She is unaware of afib.  Today, she denies symptoms of palpitations, chest pain, shortness of breath,  lower extremity edema, dizziness, presyncope, or syncope.  The patient is otherwise without complaint today.  The patient denies symptoms of fevers, chills, cough, or new SOB worrisome for COVID 19.  Past Medical History:  Diagnosis Date  . Arthritis   . Atrial flutter ([place])    s/p RFA 2003  . Breast cancer ([place])    1980's - lumpectomy only  . Colon polyps   . Diverticulosis   . DVT, lower extremity ([place]) 1998 and 1999   Bilateral  . Essential hypertension, benign   . GERD (gastroesophageal reflux disease)   . Glaucoma   . IBS (irritable bowel syndrome)   . Mixed hyperlipidemia   . Osteoporosis   . Persistent atrial fibrillation   . Pneumonia 2011  . Sick sinus syndrome (HCC)    s/p PPM (SJM)  . Urinary tract infection   . Varicose veins     Past Surgical History:  Procedure Laterality Date  . BREAST LUMPECTOMY  1980's   Right breast   . CARDIAC CATHETERIZATION  1999   Normal coronaries  . Cataracts Bilateral   . CHOLECYSTECTOMY    . COLONOSCOPY W/ BIOPSIES AND POLYPECTOMY  03/2003, 05/2008,  02/20/2011   severe diverticulosis, tubulovillous adenoma polyp, internal and external hemorrhoids 2012: severe diverticulosis, 4 mm polyp, hemorrhoids  . DILATION AND CURETTAGE OF UTERUS    . EYE SURGERY    . Fractured wrist Left 2013   repair  . INSERT / REPLACE / REMOVE PACEMAKER    . KNEE ARTHROSCOPY WITH MEDIAL MENISECTOMY Right 10/21/2014   Procedure: RIGHT KNEE ARTHROSCOPY WITH MEDIAL MENISECTOM,lateral menisectomy,synovectomy suprapatellar pouch;  Surgeon: Latanya Maudlin, MD;  [facility];  Service: Orthopedics;  Laterality: Right;  . PACEMAKER GENERATOR CHANGE  06/18/12   [facility] Acc[facility] DR RF, Dr Rayann Heman  . PACEMAKER INSERTION  2003  . PERMANENT PACEMAKER GENERATOR CHANGE N/A 06/18/2012   Procedure: PERMANENT PACEMAKER GENERATOR CHANGE;  Surgeon: Thompson Grayer, MD;  Location: [facility] CATH LAB;  Service: Cardiovascular;  Laterality: N/A;  . TOTAL KNEE ARTHROPLASTY  12/20/2011   Procedure: TOTAL KNEE ARTHROPLASTY;  Surgeon: Tobi Bastos, MD;  [facility];  Service: Orthopedics;  Laterality: Left;  . TOTAL KNEE ARTHROPLASTY Right 02/10/2015   Procedure: TOTAL RIGHT KNEE ARTHROPLASTY;  Surgeon: Latanya Maudlin, MD;  [facility];  Service: Orthopedics;  Laterality: Right;    Current Outpatient Medications  Medication Sig Dispense Refill  . acetaminophen (TYLENOL) 500 MG tablet Take 1,000 mg by mouth every 6 (six) hours as needed for mild pain.    Marland Kitchen alendronate (FOSAMAX) 70 MG  tablet TAKE 1 TABLET EVERY WEEK    . amiodarone (PACERONE) 200 MG tablet Take 0.5 tablets (100 mg total) by mouth daily. 15 tablet 6  . apixaban (ELIQUIS) 5 MG TABS tablet Take 1 tablet (5 mg total) by mouth 2 (two) times daily. 60 tablet 6  . calcium-vitamin D (OSCAL WITH D) 500-200 MG-UNIT tablet Take 2 tablets by mouth daily with breakfast.    . cyanocobalamin (,VITAMIN B-12,) 1000 MCG/ML injection Inject 1,000 mcg into the muscle every 30 (thirty) days.      Marland Kitchen latanoprost (XALATAN) 0.005 % ophthalmic solution  Place 1 drop into both eyes at bedtime.    . Multiple Vitamin (MULTIVITAMIN) tablet Take 1 tablet by mouth every morning.     . nadolol (CORGARD) 40 MG tablet TAKE 1 AND 1/2 TABLETS DAILY 135 tablet 0  . nitroGLYCERIN (NITROSTAT) 0.4 MG SL tablet Place 1 tablet (0.4 mg total) under the tongue every 5 (five) minutes as needed for chest pain. Up to 3 doses. If no relief after 3 rd dose, proceed to the ED 75 tablet 1  . omeprazole (PRILOSEC) 20 MG capsule Take 1 capsule (20 mg total) by mouth daily. 30 capsule 11   No current facility-administered medications for this visit.    Facility-Administered Medications Ordered in Other Visits  Medication Dose Route Frequency Provider Last Rate Last Dose  . bupivacaine liposome (EXPAREL) 1.3 % injection 266 mg  20 mL Infiltration Once [place], [employer], PA-C        Allergies:   Iodinated diagnostic agents and Naproxen   Social History:  The patient  reports that she has never smoked. She has never used smokeless tobacco. She reports that she does not drink alcohol or use drugs.   Family History:  The patient's family history includes Alzheimer's disease in her sister; Cancer in her father; Colon cancer in her brother, maternal grandfather, and another family member; Heart attack in an other family member; Stroke in her mother.   ROS:  Please see the history of present illness.   All other systems are personally reviewed and negative.    Exam:    Vital Signs:  BP 136/74   Pulse 63   Well sounding,     Labs/Other Tests and Data Reviewed:    Recent Labs: No results found for requested labs within last 8760 hours.   Wt Readings from Last 3 Encounters:  06/21/18 178 lb 9.6 oz (81 kg)  04/25/18 180 lb (81.6 kg)  12/14/17 180 lb 12.8 oz (82 kg)     Other studies personally reviewed: Additional studies/ records that were reviewed today include: my prior office note  Review of the above records today demonstrates: as above   Last device remote  is reviewed from [facility] PDF dated January 2020 which reveals normal device function,  afib burden is stable,  Undersensing of afib is noted at times   ASSESSMENT & PLAN:    1.  Persistent afib Stable On eliquis and amiodarone Labs followed by primary care  2. Sick sinus syndrome Normal pacemaker function by remote 06/2018  3. HTN Stable No change required today  4. COVID 19 screen The patient denies symptoms of COVID 19 at this time.  The importance of social distancing was discussed today.  Follow-up:  12 months with me in [place] office Next remote: 09/2018  Current medicines are reviewed at length with the patient today.   The patient does not have concerns regarding her medicines.  The following changes  were made today:  none  Labs/ tests ordered today include:  No orders of the defined types were placed in this encounter.   Patient Risk:  after full review of this patients clinical status, I feel that they are at moderate risk at this time.  Today, I have spent 15 minutes with the patient with telehealth technology discussing afib and pacemaker care .    Army Fossa, MD  09/09/2018 2:02 PM     [address] [place] [id number] [phone] (office) [phone] (fax)

## 2018-09-13 ENCOUNTER — Encounter: Payer: Self-pay | Admitting: Internal Medicine

## 2018-09-23 ENCOUNTER — Other Ambulatory Visit: Payer: Self-pay

## 2018-09-23 ENCOUNTER — Ambulatory Visit (INDEPENDENT_AMBULATORY_CARE_PROVIDER_SITE_OTHER): Payer: Medicare Other | Admitting: *Deleted

## 2018-09-23 DIAGNOSIS — I480 Paroxysmal atrial fibrillation: Secondary | ICD-10-CM

## 2018-09-23 DIAGNOSIS — I495 Sick sinus syndrome: Secondary | ICD-10-CM

## 2018-09-23 LAB — CUP PACEART REMOTE DEVICE CHECK
Battery Remaining Longevity: 73 mo
Battery Remaining Percentage: 73 %
Battery Voltage: 2.92 V
Brady Statistic AP VP Percent: 81 %
Brady Statistic AP VS Percent: 1.6 %
Brady Statistic AS VP Percent: 16 %
Brady Statistic AS VS Percent: 1 %
Brady Statistic RA Percent Paced: 57 %
Brady Statistic RV Percent Paced: 98 %
Date Time Interrogation Session: 20200413072740
Implantable Lead Implant Date: 20031028
Implantable Lead Implant Date: 20031028
Implantable Lead Location: 753859
Implantable Lead Location: 753860
Implantable Pulse Generator Implant Date: 20140107
Lead Channel Impedance Value: 360 Ohm
Lead Channel Impedance Value: 450 Ohm
Lead Channel Pacing Threshold Amplitude: 0.75 V
Lead Channel Pacing Threshold Amplitude: 1 V
Lead Channel Pacing Threshold Pulse Width: 0.5 ms
Lead Channel Pacing Threshold Pulse Width: 0.5 ms
Lead Channel Sensing Intrinsic Amplitude: 0.2 mV
Lead Channel Sensing Intrinsic Amplitude: 10.2 mV
Lead Channel Setting Pacing Amplitude: 2 V
Lead Channel Setting Pacing Amplitude: 2.5 V
Lead Channel Setting Pacing Pulse Width: 0.5 ms
Lead Channel Setting Sensing Sensitivity: 2 mV
Pulse Gen Model: 2210
Pulse Gen Serial Number: 7438567

## 2018-10-01 NOTE — Progress Notes (Signed)
Remote pacemaker transmission.   

## 2018-11-25 ENCOUNTER — Other Ambulatory Visit: Payer: Self-pay

## 2018-11-25 ENCOUNTER — Ambulatory Visit (INDEPENDENT_AMBULATORY_CARE_PROVIDER_SITE_OTHER): Payer: Medicare Other | Admitting: Family Medicine

## 2018-11-25 ENCOUNTER — Encounter: Payer: Self-pay | Admitting: Family Medicine

## 2018-11-25 VITALS — BP 127/81 | HR 61 | Temp 97.2°F | Ht 66.0 in | Wt 181.2 lb

## 2018-11-25 DIAGNOSIS — I4819 Other persistent atrial fibrillation: Secondary | ICD-10-CM | POA: Diagnosis not present

## 2018-11-25 DIAGNOSIS — I480 Paroxysmal atrial fibrillation: Secondary | ICD-10-CM

## 2018-11-25 DIAGNOSIS — I10 Essential (primary) hypertension: Secondary | ICD-10-CM | POA: Diagnosis not present

## 2018-11-25 DIAGNOSIS — E538 Deficiency of other specified B group vitamins: Secondary | ICD-10-CM | POA: Insufficient documentation

## 2018-11-25 DIAGNOSIS — M810 Age-related osteoporosis without current pathological fracture: Secondary | ICD-10-CM | POA: Diagnosis not present

## 2018-11-25 MED ORDER — CYANOCOBALAMIN 1000 MCG/ML IJ SOLN: 1000.0000 ug | INTRAMUSCULAR | 11 refills | Status: DC

## 2018-11-25 NOTE — Progress Notes (Signed)
BP 127/81   Pulse 61   Temp (!) 97.2 F (36.2 C) (Oral)   Ht 5\' 6"  (1.676 m)   Wt 181 lb 3.2 oz (82.2 kg)   BMI 29.25 kg/m    Subjective:    Patient ID: [name], female    DOB: November 19, 1928, 85 y.o.   MRN: [id number]  HPI: [name] is a 85 y.o. female presenting on 11/25/2018 for New Patient (Initial Visit) (Dr. Edrick Oh patient )   HPI Hypertension Patient is currently on amiodarone and nadolol, and their blood pressure today is 127/81. Patient denies any lightheadedness or dizziness. Patient denies headaches, blurred vision, chest pains, shortness of breath, or weakness. Denies any side effects from medication and is content with current medication.   Patient has osteoporosis and is currently taking Fosamax and has been for few years.  Looks like her last bone density scan was 3 years ago so we will try and get one today.  Patient also has a history of B12 deficiency and will continue current medication, she just had blood work checked recently with her previous provider Dr. Edrick Oh and we will request that paperwork.  Patient has A. fib and is currently on Eliquis and amiodarone and nadolol.  She has a cardiologist but denies any chest pain or palpitations or flutters.  Relevant past medical, surgical, family and social history reviewed and updated as indicated. Interim medical history since our last visit reviewed. Allergies and medications reviewed and updated.  Review of Systems  Constitutional: Negative for chills and fever.  Eyes: Negative for redness and visual disturbance.  Respiratory: Negative for chest tightness and shortness of breath.   Cardiovascular: Negative for chest pain, palpitations and leg swelling.  Musculoskeletal: Negative for back pain and gait problem.  Skin: Negative for rash.  Neurological: Negative for light-headedness and headaches.  Psychiatric/Behavioral: Negative for agitation and behavioral problems.  All other systems  reviewed and are negative.   Per HPI unless specifically indicated above  Social History   Socioeconomic History  . Marital status: Widowed    Spouse name: Not on file  . Number of children: Not on file  . Years of education: Not on file  . Highest education level: Not on file  Occupational History  . Occupation: Retired  Scientific laboratory technician  . Financial resource strain: Not on file  . Food insecurity    Worry: Not on file    Inability: Not on file  . Transportation needs    Medical: Not on file    Non-medical: Not on file  Tobacco Use  . Smoking status: Never Smoker  . Smokeless tobacco: Never Used  Substance and Sexual Activity  . Alcohol use: No    Alcohol/week: 0.0 standard drinks  . Drug use: No  . Sexual activity: Not on file  Lifestyle  . Physical activity    Days per week: Not on file    Minutes per session: Not on file  . Stress: Not on file  Relationships  . Social Herbalist on phone: Not on file    Gets together: Not on file    Attends religious service: Not on file    Active member of club or organization: Not on file    Attends meetings of clubs or organizations: Not on file    Relationship status: Not on file  . Intimate partner violence    Fear of current or ex partner: Not on file    Emotionally abused:  Not on file    Physically abused: Not on file    Forced sexual activity: Not on file  Other Topics Concern  . Not on file  Social History Narrative  . Not on file    Past Surgical History:  Procedure Laterality Date  . BREAST LUMPECTOMY  1980's   Right breast   . CARDIAC CATHETERIZATION  1999   Normal coronaries  . Cataracts Bilateral   . CHOLECYSTECTOMY    . COLONOSCOPY W/ BIOPSIES AND POLYPECTOMY  03/2003, 05/2008, 02/20/2011   severe diverticulosis, tubulovillous adenoma polyp, internal and external hemorrhoids 2012: severe diverticulosis, 4 mm polyp, hemorrhoids  . DILATION AND CURETTAGE OF UTERUS    . EYE SURGERY    . Fractured  wrist Left 2013   repair  . INSERT / REPLACE / REMOVE PACEMAKER    . KNEE ARTHROSCOPY WITH MEDIAL MENISECTOMY Right 10/21/2014   Procedure: RIGHT KNEE ARTHROSCOPY WITH MEDIAL MENISECTOM,lateral menisectomy,synovectomy suprapatellar pouch;  Surgeon: Latanya Maudlin, MD;  Location: [facility];  Service: Orthopedics;  Laterality: Right;  . PACEMAKER GENERATOR CHANGE  06/18/12   [facility] DR RF, Dr Rayann Heman  . PACEMAKER INSERTION  2003  . PERMANENT PACEMAKER GENERATOR CHANGE N/A 06/18/2012   Procedure: PERMANENT PACEMAKER GENERATOR CHANGE;  Surgeon: Thompson Grayer, MD;  Location: [facility] CATH LAB;  Service: Cardiovascular;  Laterality: N/A;  . TOTAL KNEE ARTHROPLASTY  12/20/2011   Procedure: TOTAL KNEE ARTHROPLASTY;  Surgeon: Tobi Bastos, MD;  Location: [facility];  Service: Orthopedics;  Laterality: Left;  . TOTAL KNEE ARTHROPLASTY Right 02/10/2015   Procedure: TOTAL RIGHT KNEE ARTHROPLASTY;  Surgeon: Latanya Maudlin, MD;  Location: [facility];  Service: Orthopedics;  Laterality: Right;    Family History  Problem Relation Age of Onset  . Cancer Father   . Stroke Mother   . Colon cancer Brother   . Colon cancer Other        Grandfather  . Heart attack Other        CHF grandmother  . Alzheimer's disease Sister   . Colon cancer Maternal Grandfather     Allergies as of 11/25/2018      Reactions   Iodinated Diagnostic Agents Hives, Itching, Rash   03/13/14: symptoms began within two hours of intrathecal injection (myelogram 03/11/14); on shoulders radiating down to stomach.  Relief with Benadryl.  Told patient she would need to pre-med with Benadryl in the future before receiving this contrast.  Brita Romp, RN   Naproxen Hives, Itching, Rash      Medication List       Accurate as of November 25, 2018  2:55 PM. If you have any questions, ask your nurse or doctor.        acetaminophen 500 MG tablet Commonly known as: TYLENOL Take 1,000 mg by mouth every 6 (six) hours as needed for mild pain.   alendronate 70  MG tablet Commonly known as: FOSAMAX TAKE 1 TABLET EVERY WEEK   amiodarone 200 MG tablet Commonly known as: Pacerone Take 0.5 tablets (100 mg total) by mouth daily.   apixaban 5 MG Tabs tablet Commonly known as: Eliquis Take 1 tablet (5 mg total) by mouth 2 (two) times daily.   calcium-vitamin D 500-200 MG-UNIT tablet Commonly known as: OSCAL WITH D Take 2 tablets by mouth daily with breakfast.   cyanocobalamin 1000 MCG/ML injection Commonly known as: (VITAMIN B-12) Inject 1,000 mcg into the muscle every 30 (thirty) days.   latanoprost 0.005 % ophthalmic solution Commonly known as: Tax inspector  1 drop into both eyes at bedtime.   multivitamin tablet Take 1 tablet by mouth every morning.   nadolol 40 MG tablet Commonly known as: CORGARD TAKE 1 AND 1/2 TABLETS DAILY   nitroGLYCERIN 0.4 MG SL tablet Commonly known as: NITROSTAT Place 1 tablet (0.4 mg total) under the tongue every 5 (five) minutes as needed for chest pain. Up to 3 doses. If no relief after 3 rd dose, proceed to the ED   omeprazole 20 MG capsule Commonly known as: PRILOSEC Take 1 capsule (20 mg total) by mouth daily.          Objective:    BP 127/81   Pulse 61   Temp (!) 97.2 F (36.2 C) (Oral)   Ht 5\' 6"  (1.676 m)   Wt 181 lb 3.2 oz (82.2 kg)   BMI 29.25 kg/m   Wt Readings from Last 3 Encounters:  11/25/18 181 lb 3.2 oz (82.2 kg)  06/21/18 178 lb 9.6 oz (81 kg)  04/25/18 180 lb (81.6 kg)    Physical Exam Vitals signs and nursing note reviewed.  Constitutional:      General: She is not in acute distress.    Appearance: She is well-developed. She is not diaphoretic.  Eyes:     Conjunctiva/sclera: Conjunctivae normal.  Cardiovascular:     Rate and Rhythm: Normal rate. Rhythm irregular.     Heart sounds: Normal heart sounds. No murmur.     Comments: Patient has pacer box in left upper chest Pulmonary:     Effort: Pulmonary effort is normal. No respiratory distress.     Breath sounds:  Normal breath sounds. No wheezing.  Musculoskeletal: Normal range of motion.        General: No tenderness.  Skin:    General: Skin is warm and dry.     Findings: No rash.  Neurological:     Mental Status: She is alert and oriented to person, place, and time.     Coordination: Coordination normal.  Psychiatric:        Behavior: Behavior normal.         Assessment & Plan:   Problem List Items Addressed This Visit      Cardiovascular and Mediastinum   Essential hypertension, benign - Primary (Chronic)   Paroxysmal atrial fibrillation (HCC)   Persistent atrial fibrillation     Musculoskeletal and Integument   Osteoporosis   Relevant Orders   DG [facility] DEXA     Other   B12 deficiency      Continue current medication, no change, will try and do a DEXA scan today if we can.  Patient has a cardiologist and gastroenterologist and they will continue to manage those issues.   Follow up plan: Return in about 4 months (around 03/27/2019), or if symptoms worsen or fail to improve, for afib and htn.  Caryl Pina, MD [facility] 11/25/2018, 2:55 PM

## 2018-12-03 ENCOUNTER — Ambulatory Visit: Payer: Medicare Other

## 2018-12-03 ENCOUNTER — Encounter (HOSPITAL_COMMUNITY): Payer: Self-pay

## 2018-12-03 ENCOUNTER — Emergency Department (HOSPITAL_COMMUNITY)
Admission: EM | Admit: 2018-12-03 | Discharge: 2018-12-03 | Disposition: A | Discharge status: Home / Self Care | Payer: Medicare Other | Attending: Emergency Medicine | Admitting: Emergency Medicine

## 2018-12-03 ENCOUNTER — Emergency Department (HOSPITAL_COMMUNITY): Payer: Medicare Other

## 2018-12-03 ENCOUNTER — Other Ambulatory Visit: Payer: Self-pay

## 2018-12-03 DIAGNOSIS — R51 Headache: Secondary | ICD-10-CM | POA: Diagnosis not present

## 2018-12-03 DIAGNOSIS — Z950 Presence of cardiac pacemaker: Secondary | ICD-10-CM | POA: Insufficient documentation

## 2018-12-03 DIAGNOSIS — I517 Cardiomegaly: Secondary | ICD-10-CM | POA: Diagnosis not present

## 2018-12-03 DIAGNOSIS — R531 Weakness: Secondary | ICD-10-CM | POA: Diagnosis not present

## 2018-12-03 DIAGNOSIS — R0989 Other specified symptoms and signs involving the circulatory and respiratory systems: Secondary | ICD-10-CM | POA: Diagnosis not present

## 2018-12-03 DIAGNOSIS — I10 Essential (primary) hypertension: Secondary | ICD-10-CM | POA: Insufficient documentation

## 2018-12-03 DIAGNOSIS — Z79899 Other long term (current) drug therapy: Secondary | ICD-10-CM | POA: Diagnosis not present

## 2018-12-03 DIAGNOSIS — Z7901 Long term (current) use of anticoagulants: Secondary | ICD-10-CM | POA: Insufficient documentation

## 2018-12-03 DIAGNOSIS — J811 Chronic pulmonary edema: Secondary | ICD-10-CM | POA: Diagnosis not present

## 2018-12-03 DIAGNOSIS — R0689 Other abnormalities of breathing: Secondary | ICD-10-CM | POA: Diagnosis not present

## 2018-12-03 DIAGNOSIS — Z96653 Presence of artificial knee joint, bilateral: Secondary | ICD-10-CM | POA: Insufficient documentation

## 2018-12-03 DIAGNOSIS — Z853 Personal history of malignant neoplasm of breast: Secondary | ICD-10-CM | POA: Diagnosis not present

## 2018-12-03 DIAGNOSIS — R002 Palpitations: Secondary | ICD-10-CM | POA: Diagnosis not present

## 2018-12-03 LAB — COMPREHENSIVE METABOLIC PANEL
ALT: 18 U/L (ref 0–44)
AST: 20 U/L (ref 15–41)
Albumin: 4.1 g/dL (ref 3.5–5.0)
Alkaline Phosphatase: 49 U/L (ref 38–126)
Anion gap: 8 (ref 5–15)
BUN: 18 mg/dL (ref 8–23)
CO2: 28 mmol/L (ref 22–32)
Calcium: 9.5 mg/dL (ref 8.9–10.3)
Chloride: 104 mmol/L (ref 98–111)
Creatinine, Ser: 0.91 mg/dL (ref 0.44–1.00)
GFR calc Af Amer: >60 mL/min (ref >60)
GFR calc non Af Amer: 57 mL/min — ABNORMAL LOW (ref >60)
Glucose, Bld: 94 mg/dL (ref 70–99)
Potassium: 4.4 mmol/L (ref 3.5–5.1)
Sodium: 140 mmol/L (ref 135–145)
Total Bilirubin: 0.8 mg/dL (ref 0.3–1.2)
Total Protein: 7 g/dL (ref 6.5–8.1)

## 2018-12-03 LAB — CBC
HCT: 37.9 % (ref 36.0–46.0)
Hemoglobin: 12.2 g/dL (ref 12.0–15.0)
MCH: 33.5 pg (ref 26.0–34.0)
MCHC: 32.2 g/dL (ref 30.0–36.0)
MCV: 104.1 fL — ABNORMAL HIGH (ref 80.0–100.0)
Platelets: 164 10*3/uL (ref 150–400)
RBC: 3.64 MIL/uL — ABNORMAL LOW (ref 3.87–5.11)
RDW: 13 % (ref 11.5–15.5)
WBC: 5 10*3/uL (ref 4.0–10.5)
nRBC: 0 % (ref 0.0–0.2)

## 2018-12-03 LAB — TROPONIN I (HIGH SENSITIVITY)
Troponin I (High Sensitivity): 11.7 ng/L (ref <18)
Troponin I (High Sensitivity): 12 ng/L (ref <18)

## 2018-12-03 NOTE — ED Triage Notes (Signed)
Pt reports experiencing fast and slow beats of heart since Sunday . Pt has a pacemaker . Reports tightness under breast and took one nitro which resolved the tightness

## 2018-12-03 NOTE — Discharge Instructions (Signed)
Follow-up with cardiology.  Return for any new or worse symptoms.

## 2018-12-03 NOTE — ED Provider Notes (Signed)
[facility] EMERGENCY DEPARTMENT Provider Note   CSN: [id number] Arrival date & time: 12/03/18  1117     History   Chief Complaint Chief Complaint  Patient presents with  . Palpitations    HPI [name] is a 85 y.o. female.     Patient has an AV pacemaker.  Known history of atrial fibrillation is on Eliquis.  Followed by Dr. Rayann Heman and cardiology.  Patient is had problems with the feeling of skipped beats or fast beats in the past.  Is also occasionally had some chest discomfort.  That happened more frequently since Sunday.  No chest pain currently.  The chest discomfort is sort of a tightness under the left breast area.  Nitro has been helping that.  All symptoms have been present in the past.  Are not new just since Sunday.  Patient without fever no upper respiratory symptoms.     Past Medical History:  Diagnosis Date  . Arthritis   . Atrial flutter ([place])    s/p RFA 2003  . Breast cancer ([place])    1980's - lumpectomy only  . Colon polyps   . Diverticulosis   . DVT, lower extremity ([place]) 1998 and 1999   Bilateral  . Essential hypertension, benign   . GERD (gastroesophageal reflux disease)   . Glaucoma   . IBS (irritable bowel syndrome)   . Mixed hyperlipidemia   . Osteoporosis   . Persistent atrial fibrillation   . Pneumonia 2011  . Sick sinus syndrome (HCC)    s/p PPM ([facility])  . Urinary tract infection   . Varicose veins     Patient Active Problem List   Diagnosis Date Noted  . Osteoporosis 11/25/2018  . B12 deficiency 11/25/2018  . Atypical chest pain 09/01/2015  . GERD (gastroesophageal reflux disease) 09/01/2015  . Abdominal pain, epigastric 09/01/2015  . Persistent atrial fibrillation 09/01/2015  . Chronic anticoagulation 09/01/2015  . History of total knee arthroplasty 02/10/2015  . Chest pain 04/18/2014  . Addison anemia 12/08/2013  . A-fib ([place]) 12/08/2013  . Sick sinus syndrome ([place]) 03/22/2011  . Hyperlipidemia 02/02/2009  . Essential  hypertension, benign 02/02/2009  . Paroxysmal atrial fibrillation ([place]) 05/15/2008  . COLONIC POLYPS, ADENOMATOUS, HX OF 05/15/2008    Past Surgical History:  Procedure Laterality Date  . BREAST LUMPECTOMY  1980's   Right breast   . CARDIAC CATHETERIZATION  1999   Normal coronaries  . Cataracts Bilateral   . CHOLECYSTECTOMY    . COLONOSCOPY W/ BIOPSIES AND POLYPECTOMY  03/2003, 05/2008, 02/20/2011   severe diverticulosis, tubulovillous adenoma polyp, internal and external hemorrhoids 2012: severe diverticulosis, 4 mm polyp, hemorrhoids  . DILATION AND CURETTAGE OF UTERUS    . EYE SURGERY    . Fractured wrist Left 2013   repair  . INSERT / REPLACE / REMOVE PACEMAKER    . KNEE ARTHROSCOPY WITH MEDIAL MENISECTOMY Right 10/21/2014   Procedure: RIGHT KNEE ARTHROSCOPY WITH MEDIAL MENISECTOM,lateral menisectomy,synovectomy suprapatellar pouch;  Surgeon: Latanya Maudlin, MD;  Location: [facility];  Service: Orthopedics;  Laterality: Right;  . PACEMAKER GENERATOR CHANGE  06/18/12   [facility] DR RF, Dr Rayann Heman  . PACEMAKER INSERTION  2003  . PERMANENT PACEMAKER GENERATOR CHANGE N/A 06/18/2012   Procedure: PERMANENT PACEMAKER GENERATOR CHANGE;  Surgeon: Thompson Grayer, MD;  Location: [facility] CATH LAB;  Service: Cardiovascular;  Laterality: N/A;  . TOTAL KNEE ARTHROPLASTY  12/20/2011   Procedure: TOTAL KNEE ARTHROPLASTY;  Surgeon: Tobi Bastos, MD;  Location: [facility];  Service: Orthopedics;  Laterality: Left;  . TOTAL KNEE ARTHROPLASTY Right 02/10/2015   Procedure: TOTAL RIGHT KNEE ARTHROPLASTY;  Surgeon: Latanya Maudlin, MD;  Location: [facility];  Service: Orthopedics;  Laterality: Right;     OB History   No obstetric history on file.      Home Medications    Prior to Admission medications   Medication Sig Start Date End Date Taking? Authorizing Provider  acetaminophen (TYLENOL) 500 MG tablet Take 1,000 mg by mouth every 6 (six) hours as needed for mild pain.   Yes [provider]  alendronate  (FOSAMAX) 70 MG tablet TAKE 1 TABLET EVERY WEEK 09/14/16  Yes [provider]  amiodarone (PACERONE) 200 MG tablet Take 0.5 tablets (100 mg total) by mouth daily. 07/07/16  Yes Satira Sark, MD  apixaban (ELIQUIS) 5 MG TABS tablet Take 1 tablet (5 mg total) by mouth 2 (two) times daily. 07/07/16  Yes Satira Sark, MD  calcium-vitamin D (OSCAL WITH D) 500-200 MG-UNIT tablet Take 2 tablets by mouth daily with breakfast.   Yes [provider]  cyanocobalamin (,VITAMIN B-12,) 1000 MCG/ML injection Inject 1 mL (1,000 mcg total) into the muscle every 30 (thirty) days. 11/25/18  Yes Dettinger, Fransisca Kaufmann, MD  famotidine (PEPCID) 20 MG tablet Take 20 mg by mouth daily.   Yes [provider]  latanoprost (XALATAN) 0.005 % ophthalmic solution Place 1 drop into both eyes at bedtime.   Yes [provider]  Multiple Vitamin (MULTIVITAMIN) tablet Take 1 tablet by mouth every morning.    Yes [provider]  nadolol (CORGARD) 40 MG tablet TAKE 1 AND 1/2 TABLETS DAILY 10/29/17  Yes Satira Sark, MD  nitroGLYCERIN (NITROSTAT) 0.4 MG SL tablet Place 1 tablet (0.4 mg total) under the tongue every 5 (five) minutes as needed for chest pain. Up to 3 doses. If no relief after 3 rd dose, proceed to the ED 12/25/16  Yes Satira Sark, MD  omeprazole (PRILOSEC) 20 MG capsule Take 1 capsule (20 mg total) by mouth daily. 09/14/15  Yes Gatha Mayer, MD    Family History Family History  Problem Relation Age of Onset  . Cancer Father   . Stroke Mother   . Colon cancer Brother   . Colon cancer Other        Grandfather  . Heart attack Other        CHF grandmother  . Alzheimer's disease Sister   . Colon cancer Maternal Grandfather     Social History Social History   Tobacco Use  . Smoking status: Never Smoker  . Smokeless tobacco: Never Used  Substance Use Topics  . Alcohol use: No    Alcohol/week: 0.0 standard drinks  . Drug use: No     Allergies    Iodinated diagnostic agents and Naproxen   Review of Systems Review of Systems  Constitutional: Negative for chills and fever.  HENT: Negative for congestion, rhinorrhea and sore throat.   Eyes: Negative for visual disturbance.  Respiratory: Negative for cough and shortness of breath.   Cardiovascular: Positive for chest pain and palpitations. Negative for leg swelling.  Gastrointestinal: Negative for abdominal pain, diarrhea, nausea and vomiting.  Genitourinary: Negative for dysuria.  Musculoskeletal: Negative for back pain and neck pain.  Skin: Negative for rash.  Neurological: Negative for dizziness, light-headedness and headaches.  Hematological: Does not bruise/bleed easily.  Psychiatric/Behavioral: Negative for confusion.     Physical Exam Updated Vital Signs BP (!) 153/100 (BP  Location: Right Arm)   Pulse 74   Temp 98.5 F (36.9 C) (Oral)   Resp 16   Ht 1.626 m (5\' 4" )   Wt 82.1 kg   SpO2 95%   BMI 31.07 kg/m   Physical Exam Vitals signs and nursing note reviewed.  Constitutional:      General: She is not in acute distress.    Appearance: Normal appearance. She is well-developed.  HENT:     Head: Normocephalic and atraumatic.  Eyes:     Extraocular Movements: Extraocular movements intact.     Conjunctiva/sclera: Conjunctivae normal.     Pupils: Pupils are equal, round, and reactive to light.  Neck:     Musculoskeletal: Neck supple.  Cardiovascular:     Rate and Rhythm: Normal rate and regular rhythm.     Heart sounds: No murmur.  Pulmonary:     Effort: Pulmonary effort is normal. No respiratory distress.     Breath sounds: Normal breath sounds.  Chest:     Chest wall: No tenderness.  Abdominal:     Palpations: Abdomen is soft.     Tenderness: There is no abdominal tenderness.  Musculoskeletal: Normal range of motion.        General: No swelling.  Skin:    General: Skin is warm and dry.  Neurological:     General: No focal deficit present.      Mental Status: She is alert and oriented to person, place, and time.      ED Treatments / Results  Labs (all labs ordered are listed, but only abnormal results are displayed) Labs Reviewed  COMPREHENSIVE METABOLIC PANEL - Abnormal; Notable for the following components:      Result Value   GFR calc non Af Amer 57 (*)    All other components within normal limits  CBC - Abnormal; Notable for the following components:   RBC 3.64 (*)    MCV 104.1 (*)    All other components within normal limits  TROPONIN I (HIGH SENSITIVITY)  TROPONIN I (HIGH SENSITIVITY)    EKG EKG Interpretation  Date/Time:  Tuesday December 03 2018 11:29:30 EDT Ventricular Rate:  76 PR Interval:    QRS Duration: 185 QT Interval:  486 QTC Calculation: 547 R Axis:   -76 Text Interpretation:  A-V dual-paced rhythm with some inhibition No further analysis attempted due to paced rhythm Confirmed by Fredia Sorrow [phone]) on 12/03/2018 11:31:49 AM   Radiology Dg Chest Port 1 View  Result Date: 12/03/2018 CLINICAL DATA:  Palpitations EXAM: PORTABLE CHEST 1 VIEW COMPARISON:  08/19/2015 FINDINGS: Mild bilateral interstitial prominence. No focal consolidation, pleural effusion or pneumothorax. Stable cardiomegaly. Dual lead cardiac pacemaker. No acute osseous abnormality. IMPRESSION: Cardiomegaly with mild pulmonary vascular congestion. Electronically Signed   By: Kathreen Devoid   On: 12/03/2018 12:30    Procedures Procedures (including critical care time)  Medications Ordered in ED Medications - No data to display   Initial Impression / Assessment and Plan / ED Course  I have reviewed the triage vital signs and the nursing notes.  Pertinent labs & imaging results that were available during my care of the patient were reviewed by me and considered in my medical decision making (see chart for details).         Patient has had symptoms like this for a while looking at past visits to cardiology.  Patient states it  is been happening more often she felt as if she was having skipped beats and sometimes  it was going fast.  Associated with a little bit of chest discomfort under her left breast area.  None currently.  Patient's work-up here without any significant abnormalities.  She has an AV paced rhythm on cardiac monitoring.  Labs without significant abnormality.  Chest x-ray showed some mild pulmonary vascular congestion no significant pulmonary edema.  Troponin was negative.  Interrogation of her pacemaker was done by [facility].  He is gone to go over things with Dr. Rayann Heman.  No gross arrhythmias noted.  If Dr. Jackalyn Lombard okay with discharge home patient will be discharged home by Dr. Roderic Palau.  The evening ED physician.  Patient nontoxic no acute distress.  Anticipate patient will be discharged home.   Patient's initial troponin was negative.  There is a delta pending.    Final Clinical Impressions(s) / ED Diagnoses   Final diagnoses:  Palpitations    ED Discharge Orders    None       Fredia Sorrow, MD 12/03/18 1512

## 2018-12-04 ENCOUNTER — Encounter: Payer: Self-pay | Admitting: *Deleted

## 2018-12-04 ENCOUNTER — Telehealth: Payer: Self-pay | Admitting: Cardiology

## 2018-12-04 NOTE — Telephone Encounter (Signed)
    Emergency Dept notes reviewed. Her pacemaker was interrogated at that time and showed no significant arrhythmias. She is already on Amiodarone, Eliquis, and Nadolol for her atrial fibrillation. If still having symptoms, would arrange for a telehealth visit with Dr. Rayann Heman to discuss possible medication dose changes.   Signed, Erma Heritage, PA-C 12/04/2018, 11:05 AM Pager: [phone]

## 2018-12-04 NOTE — Telephone Encounter (Signed)
Please advise if patient needs earlier follow up than August.

## 2018-12-04 NOTE — Telephone Encounter (Signed)
Patient notified.  Stated she has just felt weak the last couple of days & having spells off / on.  Will fwd to Dr. Rayann Heman for any further suggestions.

## 2018-12-04 NOTE — Telephone Encounter (Signed)
Patient recently went to [facility] for Afib. Was told to contact our office to see if she needs to be seen or if something else can be done to help with Afib

## 2018-12-05 ENCOUNTER — Other Ambulatory Visit: Payer: Self-pay

## 2018-12-05 ENCOUNTER — Ambulatory Visit (INDEPENDENT_AMBULATORY_CARE_PROVIDER_SITE_OTHER): Payer: Medicare Other | Admitting: *Deleted

## 2018-12-05 DIAGNOSIS — E538 Deficiency of other specified B group vitamins: Secondary | ICD-10-CM

## 2018-12-05 MED ORDER — CYANOCOBALAMIN 1000 MCG/ML IJ SOLN
1000.0000 ug | INTRAMUSCULAR | Status: AC
  Administered 2018-12-05 – 2019-10-24 (×9): 1000 ug via INTRAMUSCULAR

## 2018-12-05 NOTE — Progress Notes (Signed)
Pt given Cyanocobalamin inj Pt supplied Tolerated well

## 2018-12-08 NOTE — Telephone Encounter (Signed)
She has scheduled follow-up with Dr Domenic Polite in August.  Perhaps this can be moved up.  Otherwise, we could set her up for a virtual visit with the AF clinic if needed this week.

## 2018-12-10 NOTE — Telephone Encounter (Signed)
Patient agrees to VV/phone with A-fib Clinic tomorrow at 3:30 pm.

## 2018-12-11 ENCOUNTER — Other Ambulatory Visit: Payer: Self-pay

## 2018-12-11 ENCOUNTER — Ambulatory Visit (HOSPITAL_COMMUNITY)
Admission: RE | Admit: 2018-12-11 | Discharge: 2018-12-11 | Disposition: A | Discharge status: Home / Self Care | Payer: Medicare Other | Source: Ambulatory Visit | Attending: Physician Assistant | Admitting: Physician Assistant

## 2018-12-11 ENCOUNTER — Other Ambulatory Visit (HOSPITAL_COMMUNITY): Payer: Self-pay | Admitting: *Deleted

## 2018-12-11 DIAGNOSIS — I4819 Other persistent atrial fibrillation: Secondary | ICD-10-CM

## 2018-12-11 MED ORDER — AMIODARONE HCL 200 MG PO TABS: 200.0000 mg | ORAL_TABLET | Freq: Every day | ORAL | 6 refills | Status: DC

## 2018-12-11 NOTE — Progress Notes (Signed)
Electrophysiology TeleHealth Note   Due to national recommendations of social distancing due to [place] 19, Audio telehealth visit is felt to be most appropriate for this patient at this time.  See consent below from today for patient consent regarding telehealth for the Atrial Fibrillation Clinic. Consent obtained verbally.   Date:  12/11/2018   ID:  [name], DOB June 10, 1929, MRN [id number]  Location: home  Provider location: [address] [place], [place] [address] Evaluation Performed: Follow up  PCP:  Dettinger, Fransisca Kaufmann, MD  Primary Cardiologist:  Dr Domenic Polite Primary Electrophysiologist: Dr Rayann Heman   CC: Follow up for atrial fibrillation   History of Present Illness: [name] is a 85 y.o. female who presents via audio conferencing for a telehealth visit today. Patient reports that over the last few weeks she has had more heart racing symptoms. She presented to [facility] ER with symptoms of palpitations on 12/03/18 but was in an AV paced rhythm and device interrogation showed no significant arrhythmias. She denies any significant chest discomfort or SOB above her baseline. No triggering factors that the patient can identify.   Today, she denies symptoms of palpitations, chest pain, shortness of breath, orthopnea, PND, lower extremity edema, claudication, dizziness, presyncope, syncope, bleeding, or neurologic sequela. The patient is tolerating medications without difficulties and is otherwise without complaint today.   she denies symptoms of cough, fevers, chills, or new SOB worrisome for COVID 19.     Atrial Fibrillation Risk Factors:  she does not have symptoms or diagnosis of sleep apnea.   she has a BMI of There is no height or weight on file to calculate BMI.. There were no vitals filed for this visit.  Past Medical History:  Diagnosis Date   Arthritis    Atrial flutter [facility])    s/p RFA 2003   Breast cancer ([place])    1980's - lumpectomy only     Colon polyps    Diverticulosis    DVT, lower extremity ([place]) 1998 and 1999   Bilateral   Essential hypertension, benign    GERD (gastroesophageal reflux disease)    Glaucoma    IBS (irritable bowel syndrome)    Mixed hyperlipidemia    Osteoporosis    Persistent atrial fibrillation    Pneumonia 2011   Sick sinus syndrome ([facility])    s/p PPM ([facility])   Urinary tract infection    Varicose veins    Past Surgical History:  Procedure Laterality Date   BREAST LUMPECTOMY  1980's   Right breast    CARDIAC CATHETERIZATION  1999   Normal coronaries   Cataracts Bilateral    CHOLECYSTECTOMY     COLONOSCOPY W/ BIOPSIES AND POLYPECTOMY  03/2003, 05/2008, 02/20/2011   severe diverticulosis, tubulovillous adenoma polyp, internal and external hemorrhoids 2012: severe diverticulosis, 4 mm polyp, hemorrhoids   DILATION AND CURETTAGE OF UTERUS     EYE SURGERY     Fractured wrist Left 2013   repair   INSERT / REPLACE / REMOVE PACEMAKER     KNEE ARTHROSCOPY WITH MEDIAL MENISECTOMY Right 10/21/2014   Procedure: RIGHT KNEE ARTHROSCOPY WITH MEDIAL MENISECTOM,lateral menisectomy,synovectomy suprapatellar pouch;  Surgeon: Latanya Maudlin, MD;  Location: [facility];  Service: Orthopedics;  Laterality: Right;   PACEMAKER GENERATOR CHANGE  06/18/12   [facility] DR RF, Dr Rayann Heman   PACEMAKER INSERTION  2003   PERMANENT PACEMAKER GENERATOR CHANGE N/A 06/18/2012   Procedure: PERMANENT PACEMAKER GENERATOR CHANGE;  Surgeon: Thompson Grayer, MD;  Location:  [facility] [facility] CATH LAB;  Service: Cardiovascular;  Laterality: N/A;   TOTAL KNEE ARTHROPLASTY  12/20/2011   Procedure: TOTAL KNEE ARTHROPLASTY;  Surgeon: Tobi Bastos, MD;  Location: [facility];  Service: Orthopedics;  Laterality: Left;   TOTAL KNEE ARTHROPLASTY Right 02/10/2015   Procedure: TOTAL RIGHT KNEE ARTHROPLASTY;  Surgeon: Latanya Maudlin, MD;  Location: [facility];  Service: Orthopedics;  Laterality: Right;     Current Outpatient Medications   Medication Sig Dispense Refill   acetaminophen (TYLENOL) 500 MG tablet Take 1,000 mg by mouth every 6 (six) hours as needed for mild pain.     alendronate (FOSAMAX) 70 MG tablet TAKE 1 TABLET EVERY WEEK     amiodarone (PACERONE) 200 MG tablet Take 0.5 tablets (100 mg total) by mouth daily. 15 tablet 6   apixaban (ELIQUIS) 5 MG TABS tablet Take 1 tablet (5 mg total) by mouth 2 (two) times daily. 60 tablet 6   calcium-vitamin D (OSCAL WITH D) 500-200 MG-UNIT tablet Take 2 tablets by mouth daily with breakfast.     famotidine (PEPCID) 20 MG tablet Take 20 mg by mouth daily.     latanoprost (XALATAN) 0.005 % ophthalmic solution Place 1 drop into both eyes at bedtime.     Multiple Vitamin (MULTIVITAMIN) tablet Take 1 tablet by mouth every morning.      nadolol (CORGARD) 40 MG tablet TAKE 1 AND 1/2 TABLETS DAILY 135 tablet 0   nitroGLYCERIN (NITROSTAT) 0.4 MG SL tablet Place 1 tablet (0.4 mg total) under the tongue every 5 (five) minutes as needed for chest pain. Up to 3 doses. If no relief after 3 rd dose, proceed to the ED 75 tablet 1   omeprazole (PRILOSEC) 20 MG capsule Take 1 capsule (20 mg total) by mouth daily. 30 capsule 11   Current Facility-Administered Medications  Medication Dose Route Frequency Provider Last Rate Last Dose   cyanocobalamin ((VITAMIN B-12)) injection 1,000 mcg  1,000 mcg Intramuscular Q30 days Dettinger, Fransisca Kaufmann, MD   1,000 mcg at 12/05/18 1134   Facility-Administered Medications Ordered in Other Visits  Medication Dose Route Frequency Provider Last Rate Last Dose   bupivacaine liposome (EXPAREL) 1.3 % injection 266 mg  20 mL Infiltration Once Constable, [employer], PA-C        Allergies:   Iodinated diagnostic agents and Naproxen   Social History:  The patient  reports that she has never smoked. She has never used smokeless tobacco. She reports that she does not drink alcohol or use drugs.   Family History:  The patient's  family history includes  Alzheimer's disease in her sister; Cancer in her father; Colon cancer in her brother, maternal grandfather, and another family member; Heart attack in an other family member; Stroke in her mother.    ROS:  Please see the history of present illness.   All other systems are personally reviewed and negative.    Recent Labs: 12/03/2018: ALT 18; BUN 18; Creatinine, Ser 0.91; Hemoglobin 12.2; Platelets 164; Potassium 4.4; Sodium 140  personally reviewed    Other studies personally reviewed: Additional studies/ records that were reviewed today include: [facility] Myoview 08/01/2012: No perfusion evidence of scar or ischemia with LVEF 63%.   ASSESSMENT AND PLAN:  1.  Persistent atrial fibrillation Patient having some symptoms of heart racing although overall her AF burden has declined on recent device interrogations.  We discussed therapeutic options including increasing amiodarone. After discussing risks and benefits patient agreeable to proceed. Will increase amiodarone to 200 mg  daily Will request recent labs from PCP. Continue Eliquis 5 mg BID  This patients CHA2DS2-VASc Score and unadjusted Ischemic Stroke Rate (% per year) is equal to 4.8 % stroke rate/year from a score of 4  Above score calculated as 1 point each if present [CHF, HTN, DM, Vascular=MI/PAD/Aortic Plaque, Age if 65-74, or Female] Above score calculated as 2 points each if present [Age > 75, or Stroke/TIA/TE]  2. SSS S/p PPM, followed by Dr Rayann Heman and device clinic.  3. HTN No changes today.  COVID screen The patient does not have any symptoms that suggest any further testing/ screening at this time.  Social distancing reinforced today.    Follow-up with Dr Domenic Polite as scheduled.  Current medicines are reviewed at length with the patient today.   The patient does not have concerns regarding her medicines.  The following changes were made today:  Increase amiodarone  Labs/ tests ordered today include:  none No orders of the defined types were placed in this encounter.   Patient Risk:  after full review of this patients clinical status, I feel that they are at moderate risk at this time.   Today, I have spent 16 minutes with the patient with telehealth technology discussing atrial fibrillation and amiodarone.    Gwenlyn Perking PA-C 12/11/2018 3:26 PM  [facility] [address] [place], [place] [address] [phone]   I hereby voluntarily request, consent and authorize the [facility] and its employed or contracted physicians, physician assistants, nurse practitioners or other licensed health care professionals (the Practitioner), to provide me with telemedicine health care services (the "Services") as deemed necessary by the treating Practitioner. I acknowledge and consent to receive the Services by the Practitioner via telemedicine. I understand that the telemedicine visit will involve communicating with the Practitioner through live audiovisual communication technology and the disclosure of certain medical information by electronic transmission. I acknowledge that I have been given the opportunity to request an in-person assessment or other available alternative prior to the telemedicine visit and am voluntarily participating in the telemedicine visit.   I understand that I have the right to withhold or withdraw my consent to the use of telemedicine in the course of my care at any time, without affecting my right to future care or treatment, and that the Practitioner or I may terminate the telemedicine visit at any time. I understand that I have the right to inspect all information obtained and/or recorded in the course of the telemedicine visit and may receive copies of available information for a reasonable fee.  I understand that some of the potential risks of receiving the Services via telemedicine include:   Delay or interruption in medical  evaluation due to technological equipment failure or disruption;  Information transmitted may not be sufficient (e.g. poor resolution of images) to allow for appropriate medical decision making by the Practitioner; and/or  In rare instances, security protocols could fail, causing a breach of personal health information.   Furthermore, I acknowledge that it is my responsibility to provide information about my medical history, conditions and care that is complete and accurate to the best of my ability. I acknowledge that Practitioner's advice, recommendations, and/or decision may be based on factors not within their control, such as incomplete or inaccurate data provided by me or distortions of diagnostic images or specimens that may result from electronic transmissions. I understand that the practice of medicine is not an exact science and that Practitioner makes no  warranties or guarantees regarding treatment outcomes. I acknowledge that I will receive a copy of this consent concurrently upon execution via email to the email address I last provided but may also request a printed copy by calling the office of the [facility].  I understand that my insurance will be billed for this visit.   I have read or had this consent read to me.  I understand the contents of this consent, which adequately explains the benefits and risks of the Services being provided via telemedicine.  I have been provided ample opportunity to ask questions regarding this consent and the Services and have had my questions answered to my satisfaction.  I give my informed consent for the services to be provided through the use of telemedicine in my medical care  By participating in this telemedicine visit I agree to the above.

## 2018-12-23 ENCOUNTER — Ambulatory Visit (INDEPENDENT_AMBULATORY_CARE_PROVIDER_SITE_OTHER): Payer: Medicare Other | Admitting: *Deleted

## 2018-12-23 DIAGNOSIS — I495 Sick sinus syndrome: Secondary | ICD-10-CM | POA: Diagnosis not present

## 2018-12-23 DIAGNOSIS — I4819 Other persistent atrial fibrillation: Secondary | ICD-10-CM

## 2018-12-23 LAB — CUP PACEART REMOTE DEVICE CHECK
Battery Remaining Longevity: 71 mo
Battery Remaining Percentage: 73 %
Battery Voltage: 2.92 V
Brady Statistic AP VP Percent: 83 %
Brady Statistic AP VS Percent: 1.2 %
Brady Statistic AS VP Percent: 15 %
Brady Statistic AS VS Percent: 1 %
Brady Statistic RA Percent Paced: 63 %
Brady Statistic RV Percent Paced: 98 %
Date Time Interrogation Session: 20200713075803
Implantable Lead Implant Date: 20031028
Implantable Lead Implant Date: 20031028
Implantable Lead Location: 753859
Implantable Lead Location: 753860
Implantable Pulse Generator Implant Date: 20140107
Lead Channel Impedance Value: 350 Ohm
Lead Channel Impedance Value: 440 Ohm
Lead Channel Pacing Threshold Amplitude: 0.75 V
Lead Channel Pacing Threshold Amplitude: 1 V
Lead Channel Pacing Threshold Pulse Width: 0.5 ms
Lead Channel Pacing Threshold Pulse Width: 0.5 ms
Lead Channel Sensing Intrinsic Amplitude: 0.2 mV
Lead Channel Sensing Intrinsic Amplitude: 11 mV
Lead Channel Setting Pacing Amplitude: 2 V
Lead Channel Setting Pacing Amplitude: 2.5 V
Lead Channel Setting Pacing Pulse Width: 0.5 ms
Lead Channel Setting Sensing Sensitivity: 2 mV
Pulse Gen Model: 2210
Pulse Gen Serial Number: 7438567

## 2018-12-24 ENCOUNTER — Ambulatory Visit (INDEPENDENT_AMBULATORY_CARE_PROVIDER_SITE_OTHER): Payer: Medicare Other | Admitting: *Deleted

## 2018-12-24 VITALS — BP 125/75 | HR 70 | Ht 64.0 in | Wt 181.0 lb

## 2018-12-24 DIAGNOSIS — Z0000 Encounter for general adult medical examination without abnormal findings: Secondary | ICD-10-CM

## 2018-12-24 NOTE — Patient Instructions (Signed)
  Ms. Jagodzinski , Thank you for taking time to come for your Medicare Wellness Visit. I appreciate your ongoing commitment to your health goals. Please review the following plan we discussed and let me know if I can assist you in the future.   These are the goals we discussed: Goals    . Prevent falls     Stay active ( garden)  Lacinda Axon more / bake more        This is a list of the screening recommended for you and due dates:  Health Maintenance  Topic Date Due  . DEXA scan (bone density measurement)  05/17/1996  . Flu Shot  01/11/2019  . Tetanus Vaccine  01/27/2020  . Pneumonia vaccines  Completed

## 2018-12-24 NOTE — Progress Notes (Signed)
MEDICARE ANNUAL WELLNESS VISIT  12/24/2018  Telephone Visit Disclaimer This Medicare AWV was conducted by telephone due to national recommendations for restrictions regarding the COVID-19 Pandemic (e.g. social distancing).  I verified, using two identifiers, that I am speaking with [name] or their authorized healthcare agent. I discussed the limitations, risks, security, and privacy concerns of performing an evaluation and management service by telephone and the potential availability of an in-person appointment in the future. The patient expressed understanding and agreed to proceed.   Subjective:  [name] is a 85 y.o. female patient of Dettinger, Fransisca Kaufmann, MD who had a Medicare Annual Wellness Visit today via telephone. [place] is Retired and lives alone. she has 5 children. she reports that she is socially active and does interact with friends/family regularly. she is not physically active and enjoys gardening.  Patient Care Team: Dettinger, Fransisca Kaufmann, MD as PCP - General (Family Medicine) Satira Sark, MD as PCP - Cardiology (Cardiology) Latanya Maudlin, MD as Surgeon (Orthopedic Surgery) Thompson Grayer, MD as Consulting Physician (Cardiology) Rutherford Guys, MD as Consulting Physician (Ophthalmology)  Advanced Directives 12/24/2018 12/03/2018 04/25/2018 09/13/2015 03/10/2015 02/10/2015 02/03/2015  Does Patient Have a Medical Advance Directive? No No No No No No No  Would patient like information on creating a medical advance directive? No - Patient declined - No - Patient declined No - patient declined information - No - patient declined information No - patient declined information  Pre-existing out of facility DNR order (yellow form or pink MOST form) - - - - - - -    Hospital Utilization Over the Past 12 Months: # of hospitalizations or ER visits: 0 # of surgeries: 0  Review of Systems    Patient reports that her overall health is unchanged compared to  last year.  Patient Reported Readings (BP, Pulse, CBG, Weight, etc) BP 125/75 Comment: home reading  Pulse 70   Ht 5\' 4"  (1.626 m)   Wt 181 lb (82.1 kg)   BMI 31.07 kg/m    Review of Systems:  NEGATIVE   All other systems negative.  Pain Assessment       Current Medications & Allergies (verified) Allergies as of 12/24/2018      Reactions   Iodinated Diagnostic Agents Hives, Itching, Rash   03/13/14: symptoms began within two hours of intrathecal injection (myelogram 03/11/14); on shoulders radiating down to stomach.  Relief with Benadryl.  Told patient she would need to pre-med with Benadryl in the future before receiving this contrast.  Brita Romp, RN   Naproxen Hives, Itching, Rash      Medication List       Accurate as of December 24, 2018  9:54 AM. If you have any questions, ask your nurse or doctor.        acetaminophen 500 MG tablet Commonly known as: TYLENOL Take 1,000 mg by mouth every 6 (six) hours as needed for mild pain.   alendronate 70 MG tablet Commonly known as: FOSAMAX TAKE 1 TABLET EVERY WEEK   amiodarone 200 MG tablet Commonly known as: Pacerone Take 1 tablet (200 mg total) by mouth daily.   apixaban 5 MG Tabs tablet Commonly known as: Eliquis Take 1 tablet (5 mg total) by mouth 2 (two) times daily.   calcium-vitamin D 500-200 MG-UNIT tablet Commonly known as: OSCAL WITH D Take 2 tablets by mouth daily with breakfast.   famotidine 20 MG tablet Commonly known as: PEPCID Take 20 mg by mouth  daily.   latanoprost 0.005 % ophthalmic solution Commonly known as: XALATAN Place 1 drop into both eyes at bedtime.   multivitamin tablet Take 1 tablet by mouth every morning.   nadolol 40 MG tablet Commonly known as: CORGARD TAKE 1 AND 1/2 TABLETS DAILY   nitroGLYCERIN 0.4 MG SL tablet Commonly known as: NITROSTAT Place 1 tablet (0.4 mg total) under the tongue every 5 (five) minutes as needed for chest pain. Up to 3 doses. If no relief after 3 rd  dose, proceed to the ED   omeprazole 20 MG capsule Commonly known as: PRILOSEC Take 1 capsule (20 mg total) by mouth daily.       History (reviewed): Past Medical History:  Diagnosis Date  . Arthritis   . Atrial flutter ([place])    s/p RFA 2003  . Breast cancer ([place])    1980's - lumpectomy only  . Colon polyps   . Diverticulosis   . DVT, lower extremity ([place]) 1998 and 1999   Bilateral  . Essential hypertension, benign   . GERD (gastroesophageal reflux disease)   . Glaucoma   . IBS (irritable bowel syndrome)   . Mixed hyperlipidemia   . Osteoporosis   . Persistent atrial fibrillation   . Pneumonia 2011  . Sick sinus syndrome ([facility])    s/p PPM ([facility])  . Urinary tract infection   . Varicose veins    Past Surgical History:  Procedure Laterality Date  . BREAST LUMPECTOMY  1980's   Right breast   . CARDIAC CATHETERIZATION  1999   Normal coronaries  . Cataracts Bilateral   . CHOLECYSTECTOMY    . COLONOSCOPY W/ BIOPSIES AND POLYPECTOMY  03/2003, 05/2008, 02/20/2011   severe diverticulosis, tubulovillous adenoma polyp, internal and external hemorrhoids 2012: severe diverticulosis, 4 mm polyp, hemorrhoids  . CYST REMOVAL TRUNK     under breast = on belly   . DILATION AND CURETTAGE OF UTERUS    . EYE SURGERY     cataracts - bilateral   . Fractured wrist Left 2013   repair  . INSERT / REPLACE / REMOVE PACEMAKER    . KNEE ARTHROSCOPY WITH MEDIAL MENISECTOMY Right 10/21/2014   Procedure: RIGHT KNEE ARTHROSCOPY WITH MEDIAL MENISECTOM,lateral menisectomy,synovectomy suprapatellar pouch;  Surgeon: Latanya Maudlin, MD;  Location: [facility];  Service: Orthopedics;  Laterality: Right;  . PACEMAKER GENERATOR CHANGE  06/18/12   [facility] DR RF, Dr Rayann Heman  . PACEMAKER INSERTION  2003  . PERMANENT PACEMAKER GENERATOR CHANGE N/A 06/18/2012   Procedure: PERMANENT PACEMAKER GENERATOR CHANGE;  Surgeon: Thompson Grayer, MD;  [facility] C[facility] LAB;  Service: Cardiovascular;  Laterality: N/A;  . TOTAL  KNEE ARTHROPLASTY  12/20/2011   Procedure: TOTAL KNEE ARTHROPLASTY;  Surgeon: Tobi Bastos, MD;  Location: [facility];  Service: Orthopedics;  Laterality: Left;  . TOTAL KNEE ARTHROPLASTY Right 02/10/2015   Procedure: TOTAL RIGHT KNEE ARTHROPLASTY;  Surgeon: Latanya Maudlin, MD;  Location: [facility];  Service: Orthopedics;  Laterality: Right;   Family History  Problem Relation Age of Onset  . Cancer Father   . Stroke Mother   . Alzheimer's disease Brother   . Colon cancer Other        Grandfather  . Heart attack Other        CHF grandmother  . Alzheimer's disease Sister   . Colon cancer Maternal Grandfather   . Cancer Son        prostate   . Colon cancer Sister   . Colon cancer Brother   .  Heart disease Brother   . Atrial fibrillation Brother   . Diabetes Brother   . Cancer Son        prostate   Social History   Socioeconomic History  . Marital status: Widowed    Spouse name: Not on file  . Number of children: 5  . Years of education: Not on file  . Highest education level: Not on file  Occupational History  . Occupation: Retired    Comment: resturant / cafe   Social Needs  . Financial resource strain: Not on file  . Food insecurity    Worry: Not on file    Inability: Not on file  . Transportation needs    Medical: Not on file    Non-medical: Not on file  Tobacco Use  . Smoking status: Never Smoker  . Smokeless tobacco: Never Used  Substance and Sexual Activity  . Alcohol use: No    Alcohol/week: 0.0 standard drinks  . Drug use: No  . Sexual activity: Not on file  Lifestyle  . Physical activity    Days per week: Not on file    Minutes per session: Not on file  . Stress: Not on file  Relationships  . Social Herbalist on phone: Not on file    Gets together: Not on file    Attends religious service: Not on file    Active member of club or organization: Not on file    Attends meetings of clubs or organizations: Not on file    Relationship status: Not  on file  Other Topics Concern  . Not on file  Social History Narrative   Widowed  - 5 sons = 1 lives local     Activities of Daily Living In your present state of health, do you have any difficulty performing the following activities: 12/24/2018  Hearing? N  Vision? Y  Comment wears RX glasses  Difficulty concentrating or making decisions? Y  Comment names are hard to remember  Walking or climbing stairs? Y  Comment stairs are hard  Dressing or bathing? N  Doing errands, shopping? N  Preparing Food and eating ? N  Using the Toilet? N  In the past six months, have you accidently leaked urine? N  Do you have problems with loss of bowel control? N  Managing your Medications? N  Managing your Finances? N  Housekeeping or managing your Housekeeping? N  Some recent data might be hidden    Patient Literacy    Exercise Current Exercise Habits: The patient does not participate in regular exercise at present  Diet Patient reports consuming 2 meals a day and 1 snack(s) a day Patient reports that her primary diet is: Regular Patient reports that she does have regular access to food.   Depression Screen PHQ 2/9 Scores 12/24/2018 11/25/2018  PHQ - 2 Score 1 0     Fall Risk Fall Risk  12/24/2018 11/25/2018  Falls in the past year? 0 0     Objective:  [place] seemed alert and oriented and she participated appropriately during our telephone visit.  Blood Pressure Weight BMI  BP Readings from Last 3 Encounters:  12/24/18 125/75  12/03/18 139/71  11/25/18 127/81   Wt Readings from Last 3 Encounters:  12/24/18 181 lb (82.1 kg)  12/03/18 181 lb (82.1 kg)  11/25/18 181 lb 3.2 oz (82.2 kg)   BMI Readings from Last 1 Encounters:  12/24/18 31.07 kg/m    *Unable to  obtain current vital signs, weight, and BMI due to telephone visit type  Hearing/Vision  . [place] did not seem to have difficulty with hearing/understanding during the telephone conversation . Reports  that she has had a formal eye exam by an eye care professional within the past year . Reports that she has not had a formal hearing evaluation within the past year *Unable to fully assess hearing and vision during telephone visit type  Cognitive Function: 6CIT Screen 12/24/2018  What Year? 0 points  What month? 0 points  What time? 0 points  Count back from 20 0 points  Months in reverse 0 points  Repeat phrase 0 points  Total Score 0   (Normal:0-7, Significant for Dysfunction: >8)  Normal Cognitive Function Screening: Yes   Immunization & Health Maintenance Record Immunization History  Administered Date(s) Administered  . Influenza, High Dose Seasonal PF 04/02/2015, 04/25/2017, 03/13/2018  . Influenza,trivalent, recombinat, inj, PF 03/13/2014  . Pneumococcal Conjugate-13 04/02/2015  . Pneumococcal Polysaccharide-23 08/16/2016  . Tdap 01/26/2010    Health Maintenance  Topic Date Due  . DEXA SCAN  05/17/1996  . INFLUENZA VACCINE  01/11/2019  . TETANUS/TDAP  01/27/2020  . PNA vac Low Risk Adult  Completed       Assessment  This is a routine wellness examination for [place].  Health Maintenance: Due or Overdue Health Maintenance Due  Topic Date Due  . DEXA SCAN  05/17/1996    [place] B Palma does not need a referral for [employer] Assistance: Care Management:   no Social Work:    no Prescription Assistance:  no Nutrition/Diabetes Education:  no   Plan:  Personalized Goals Goals Addressed            This Visit's Progress   . Prevent falls       Stay active ( garden)  Cook more / bake more       Personalized Health Maintenance & Screening Recommendations  labs at next appointment  Lung Cancer Screening Recommended: no (Low Dose CT Chest recommended if Age 48-80 years, 30 pack-year currently smoking OR have quit w/in past 15 years) Hepatitis C Screening recommended: no HIV Screening recommended: no  Advanced Directives: Written  information was not prepared per patient's request.  Referrals & Orders No orders of the defined types were placed in this encounter.   Follow-up Plan . Follow-up with Dettinger, Fransisca Kaufmann, MD as planned    I have personally reviewed and noted the following in the patient's chart:   . Medical and social history . Use of alcohol, tobacco or illicit drugs  . Current medications and supplements . Functional ability and status . Nutritional status . Physical activity . Advanced directives . List of other physicians . Hospitalizations, surgeries, and ER visits in previous 12 months . Vitals . Screenings to include cognitive, depression, and falls . Referrals and appointments  In addition, I have reviewed and discussed with [place] certain preventive protocols, quality metrics, and best practice recommendations. A written personalized care plan for preventive services as well as general preventive health recommendations is available and can be mailed to the patient at her request.      [place] Dec  12/24/2018

## 2019-01-01 NOTE — Progress Notes (Signed)
Remote pacemaker transmission.   

## 2019-01-07 ENCOUNTER — Ambulatory Visit (INDEPENDENT_AMBULATORY_CARE_PROVIDER_SITE_OTHER): Payer: Medicare Other | Admitting: *Deleted

## 2019-01-07 ENCOUNTER — Other Ambulatory Visit: Payer: Self-pay

## 2019-01-07 DIAGNOSIS — E538 Deficiency of other specified B group vitamins: Secondary | ICD-10-CM | POA: Diagnosis not present

## 2019-01-16 ENCOUNTER — Telehealth (INDEPENDENT_AMBULATORY_CARE_PROVIDER_SITE_OTHER): Payer: Medicare Other | Admitting: Cardiology

## 2019-01-16 ENCOUNTER — Encounter: Payer: Self-pay | Admitting: Cardiology

## 2019-01-16 VITALS — BP 128/77 | HR 83 | Ht 66.0 in | Wt 182.0 lb

## 2019-01-16 DIAGNOSIS — I4819 Other persistent atrial fibrillation: Secondary | ICD-10-CM

## 2019-01-16 DIAGNOSIS — I10 Essential (primary) hypertension: Secondary | ICD-10-CM

## 2019-01-16 DIAGNOSIS — I495 Sick sinus syndrome: Secondary | ICD-10-CM

## 2019-01-16 NOTE — Progress Notes (Signed)
Virtual Visit via Telephone Note   This visit type was conducted due to national recommendations for restrictions regarding the COVID-19 Pandemic (e.g. social distancing) in an effort to limit this patient's exposure and mitigate transmission in our community.  Due to her co-morbid illnesses, this patient is at least at moderate risk for complications without adequate follow up.  This format is felt to be most appropriate for this patient at this time.  The patient did not have access to video technology/had technical difficulties with video requiring transitioning to audio format only (telephone).  All issues noted in this document were discussed and addressed.  No physical exam could be performed with this format.  Please refer to the patient's chart for her  consent to telehealth for [facility].   Date:  01/16/2019   ID:  [name], DOB 1928/08/01, MRN [id number]  Patient Location: Home Provider Location: Office  PCP:  Dettinger, Fransisca Kaufmann, MD  Cardiologist:  Rozann Lesches, MD Electrophysiologist:  None   Evaluation Performed:  Follow-Up Visit  Chief Complaint:   Cardiac follow-up  History of Present Illness:    [name] is an 85 y.o. female that I last saw in January.  Relaxants we spoke by phone today.  She sees Dr. Rayann Heman in the device clinic, [place] pacemaker in place.  She had follow-up with Dr. Rayann Heman in March and more recently in the atrial fibrillation clinic.  She had been experiencing and increased sense of palpitations, however device interrogation did not reveal increased atrial fibrillation burden.  Amiodarone was increased to 200 mg daily and she was continued on Eliquis.  She tells me that she has been feeling better overall, less of a sense of palpitations.  She does not report any other changes in her medications, no bleeding problems on Eliquis.  I reviewed her lab work and ECG from June.  The patient does not have symptoms concerning for  COVID-19 infection (fever, chills, cough, or new shortness of breath).    Past Medical History:  Diagnosis Date  . Arthritis   . Atrial flutter ([place])    s/p RFA 2003  . Breast cancer ([place])    1980's - lumpectomy only  . Colon polyps   . Diverticulosis   . DVT, lower extremity ([place]) 1998 and 1999   Bilateral  . Essential hypertension   . GERD (gastroesophageal reflux disease)   . Glaucoma   . IBS (irritable bowel syndrome)   . Mixed hyperlipidemia   . Osteoporosis   . Persistent atrial fibrillation   . Pneumonia 2011  . Sick sinus syndrome ([facility])    s/p PPM ([facility])  . Urinary tract infection   . Varicose veins    Past Surgical History:  Procedure Laterality Date  . BREAST LUMPECTOMY  1980's   Right breast   . CARDIAC CATHETERIZATION  1999   Normal coronaries  . Cataracts Bilateral   . CHOLECYSTECTOMY    . COLONOSCOPY W/ BIOPSIES AND POLYPECTOMY  03/2003, 05/2008, 02/20/2011   severe diverticulosis, tubulovillous adenoma polyp, internal and external hemorrhoids 2012: severe diverticulosis, 4 mm polyp, hemorrhoids  . CYST REMOVAL TRUNK     under breast = on belly   . DILATION AND CURETTAGE OF UTERUS    . EYE SURGERY     cataracts - bilateral   . Fractured wrist Left 2013   repair  . INSERT / REPLACE / REMOVE PACEMAKER    . KNEE ARTHROSCOPY WITH MEDIAL MENISECTOMY Right 10/21/2014  Procedure: RIGHT KNEE ARTHROSCOPY WITH MEDIAL MENISECTOM,lateral menisectomy,synovectomy suprapatellar pouch;  Surgeon: Latanya Maudlin, MD;  Location: [facility];  Service: Orthopedics;  Laterality: Right;  . PACEMAKER GENERATOR CHANGE  06/18/12   [facility] Acc[facility] DR RF, Dr Rayann Heman  . PACEMAKER INSERTION  2003  . PERMANENT PACEMAKER GENERATOR CHANGE N/A 06/18/2012   Procedure: PERMANENT PACEMAKER GENERATOR CHANGE;  Surgeon: Thompson Grayer, MD;  Location: [facility] CATH LAB;  Service: Cardiovascular;  Laterality: N/A;  . TOTAL KNEE ARTHROPLASTY  12/20/2011   Procedure: TOTAL KNEE ARTHROPLASTY;  Surgeon: Tobi Bastos, MD;  Location: [facility];  Service: Orthopedics;  Laterality: Left;  . TOTAL KNEE ARTHROPLASTY Right 02/10/2015   Procedure: TOTAL RIGHT KNEE ARTHROPLASTY;  Surgeon: Latanya Maudlin, MD;  Location: [facility];  Service: Orthopedics;  Laterality: Right;     Current Meds  Medication Sig  . acetaminophen (TYLENOL) 500 MG tablet Take 1,000 mg by mouth every 6 (six) hours as needed for mild pain.  Marland Kitchen alendronate (FOSAMAX) 70 MG tablet TAKE 1 TABLET EVERY WEEK  . amiodarone (PACERONE) 200 MG tablet Take 1 tablet (200 mg total) by mouth daily.  Marland Kitchen apixaban (ELIQUIS) 5 MG TABS tablet Take 1 tablet (5 mg total) by mouth 2 (two) times daily.  . calcium-vitamin D (OSCAL WITH D) 500-200 MG-UNIT tablet Take 1 tablet by mouth daily with breakfast.   . famotidine (PEPCID) 20 MG tablet Take 20 mg by mouth daily.  Marland Kitchen latanoprost (XALATAN) 0.005 % ophthalmic solution Place 1 drop into both eyes at bedtime.  . Multiple Vitamin (MULTIVITAMIN) tablet Take 1 tablet by mouth every morning.   . nadolol (CORGARD) 40 MG tablet TAKE 1 AND 1/2 TABLETS DAILY  . nitroGLYCERIN (NITROSTAT) 0.4 MG SL tablet Place 1 tablet (0.4 mg total) under the tongue every 5 (five) minutes as needed for chest pain. Up to 3 doses. If no relief after 3 rd dose, proceed to the ED  . omeprazole (PRILOSEC) 20 MG capsule Take 1 capsule (20 mg total) by mouth daily.   Current Facility-Administered Medications for the 01/16/19 encounter (Telemedicine) with Satira Sark, MD  Medication  . cyanocobalamin ((VITAMIN B-12)) injection 1,000 mcg     Allergies:   Iodinated diagnostic agents and Naproxen   Social History   Tobacco Use  . Smoking status: Never Smoker  . Smokeless tobacco: Never Used  Substance Use Topics  . Alcohol use: No    Alcohol/week: 0.0 standard drinks  . Drug use: No     Family Hx: The patient's family history includes Alzheimer's disease in her brother and sister; Atrial fibrillation in her brother; Cancer in her  father, son, and son; Colon cancer in her brother, maternal grandfather, sister, and another family member; Diabetes in her brother; Heart attack in an other family member; Heart disease in her brother; Stroke in her mother.  ROS:   Please see the history of present illness. All other systems reviewed and are negative.   Prior CV studies:   The following studies were reviewed today:  Lexiscan Myoview 08/01/2012: No perfusion evidence of scar or ischemia with LVEF 63%.  Labs/Other Tests and Data Reviewed:    EKG:  An ECG dated 12/03/2018 was personally reviewed today and demonstrated:  Dual-chamber paced rhythm with intermittent ventricular pacing.  Recent Labs: 12/03/2018: ALT 18; BUN 18; Creatinine, Ser 0.91; Hemoglobin 12.2; Platelets 164; Potassium 4.4; Sodium 140   Wt Readings from Last 3 Encounters:  01/16/19 182 lb (82.6 kg)  12/24/18 181 lb (82.1 kg)  12/03/18 181 lb (82.1 kg)     Objective:    Vital Signs:  BP 128/77   Pulse 83   Ht 5\' 6"  (1.676 m)   Wt 182 lb (82.6 kg)   BMI 29.38 kg/m    Patient spoke in full sentences, not short of breath. No audible wheezing or coughing. Speech pattern normal.  ASSESSMENT & PLAN:    1.  Paroxysmal atrial fibrillation.  Records reviewed.  Patient is tolerating current regimen including amiodarone now at 200 mg daily.  Continue Eliquis for stroke prophylaxis.  2.  Sick sinus syndrome with [employer] pacemaker in place.  Keep follow-up with Dr. Rayann Heman.  3.  Essential hypertension, blood pressure is well controlled today.  COVID-19 Education: The signs and symptoms of COVID-19 were discussed with the patient and how to seek care for testing (follow up with PCP or arrange E-visit).  The importance of social distancing was discussed today.  Time:   Today, I have spent 6 minutes with the patient with telehealth technology discussing the above problems.     Medication Adjustments/Labs and Tests Ordered: Current medicines are  reviewed at length with the patient today.  Concerns regarding medicines are outlined above.   Tests Ordered: No orders of the defined types were placed in this encounter.   Medication Changes: No orders of the defined types were placed in this encounter.   Follow Up:  In Person 4 months in the [place] office.  Signed, Rozann Lesches, MD  01/16/2019 2:42 PM    [facility]

## 2019-01-16 NOTE — Patient Instructions (Addendum)
Medication Instructions:   Your physician recommends that you continue on your current medications as directed. Please refer to the Current Medication list given to you today.  Labwork:  NONE  Testing/Procedures:  NONE  Follow-Up:  Your physician recommends that you schedule a follow-up appointment in: 4 months.  Any Other Special Instructions Will Be Listed Below (If Applicable).  If you need a refill on your cardiac medications before your next appointment, please call your pharmacy. 

## 2019-02-10 ENCOUNTER — Ambulatory Visit (INDEPENDENT_AMBULATORY_CARE_PROVIDER_SITE_OTHER): Payer: Medicare Other | Admitting: *Deleted

## 2019-02-10 ENCOUNTER — Other Ambulatory Visit: Payer: Self-pay

## 2019-02-10 DIAGNOSIS — E538 Deficiency of other specified B group vitamins: Secondary | ICD-10-CM

## 2019-02-10 NOTE — Progress Notes (Signed)
Pt given Cyanocobalamin inj Tolerated well 

## 2019-03-17 ENCOUNTER — Other Ambulatory Visit: Payer: Self-pay | Admitting: Family Medicine

## 2019-03-18 ENCOUNTER — Other Ambulatory Visit: Payer: Self-pay

## 2019-03-18 ENCOUNTER — Ambulatory Visit (INDEPENDENT_AMBULATORY_CARE_PROVIDER_SITE_OTHER): Payer: Medicare Other

## 2019-03-18 DIAGNOSIS — Z23 Encounter for immunization: Secondary | ICD-10-CM | POA: Diagnosis not present

## 2019-03-18 DIAGNOSIS — E538 Deficiency of other specified B group vitamins: Secondary | ICD-10-CM

## 2019-03-18 NOTE — Progress Notes (Signed)
Cyanocobalamin injection given to right deltoid.  Patient tolerated well. 

## 2019-03-25 ENCOUNTER — Ambulatory Visit (INDEPENDENT_AMBULATORY_CARE_PROVIDER_SITE_OTHER): Payer: Medicare Other | Admitting: *Deleted

## 2019-03-25 DIAGNOSIS — I4819 Other persistent atrial fibrillation: Secondary | ICD-10-CM | POA: Diagnosis not present

## 2019-03-25 DIAGNOSIS — I495 Sick sinus syndrome: Secondary | ICD-10-CM

## 2019-03-25 LAB — CUP PACEART REMOTE DEVICE CHECK
Battery Remaining Longevity: 63 mo
Battery Remaining Percentage: 65 %
Battery Voltage: 2.9 V
Brady Statistic AP VP Percent: 83 %
Brady Statistic AP VS Percent: 1.3 %
Brady Statistic AS VP Percent: 16 %
Brady Statistic AS VS Percent: 1 %
Brady Statistic RA Percent Paced: 66 %
Brady Statistic RV Percent Paced: 98 %
Date Time Interrogation Session: 20201012060009
Implantable Lead Implant Date: 20031028
Implantable Lead Implant Date: 20031028
Implantable Lead Location: 753859
Implantable Lead Location: 753860
Implantable Pulse Generator Implant Date: 20140107
Lead Channel Impedance Value: 350 Ohm
Lead Channel Impedance Value: 440 Ohm
Lead Channel Pacing Threshold Amplitude: 0.75 V
Lead Channel Pacing Threshold Amplitude: 1 V
Lead Channel Pacing Threshold Pulse Width: 0.5 ms
Lead Channel Pacing Threshold Pulse Width: 0.5 ms
Lead Channel Sensing Intrinsic Amplitude: 0.2 mV
Lead Channel Sensing Intrinsic Amplitude: 10.8 mV
Lead Channel Setting Pacing Amplitude: 2 V
Lead Channel Setting Pacing Amplitude: 2.5 V
Lead Channel Setting Pacing Pulse Width: 0.5 ms
Lead Channel Setting Sensing Sensitivity: 2 mV
Pulse Gen Model: 2210
Pulse Gen Serial Number: 7438567

## 2019-03-26 ENCOUNTER — Emergency Department (HOSPITAL_COMMUNITY): Payer: Medicare Other

## 2019-03-26 ENCOUNTER — Emergency Department (HOSPITAL_COMMUNITY)
Admission: EM | Admit: 2019-03-26 | Discharge: 2019-03-26 | Disposition: A | Discharge status: Home / Self Care | Payer: Medicare Other | Attending: Emergency Medicine | Admitting: Emergency Medicine

## 2019-03-26 ENCOUNTER — Encounter (HOSPITAL_COMMUNITY): Payer: Self-pay | Admitting: *Deleted

## 2019-03-26 ENCOUNTER — Other Ambulatory Visit: Payer: Self-pay

## 2019-03-26 DIAGNOSIS — W010XXA Fall on same level from slipping, tripping and stumbling without subsequent striking against object, initial encounter: Secondary | ICD-10-CM | POA: Diagnosis not present

## 2019-03-26 DIAGNOSIS — Z79899 Other long term (current) drug therapy: Secondary | ICD-10-CM | POA: Diagnosis not present

## 2019-03-26 DIAGNOSIS — S52501A Unspecified fracture of the lower end of right radius, initial encounter for closed fracture: Secondary | ICD-10-CM | POA: Diagnosis not present

## 2019-03-26 DIAGNOSIS — Y92009 Unspecified place in unspecified non-institutional (private) residence as the place of occurrence of the external cause: Secondary | ICD-10-CM | POA: Diagnosis not present

## 2019-03-26 DIAGNOSIS — I10 Essential (primary) hypertension: Secondary | ICD-10-CM | POA: Diagnosis not present

## 2019-03-26 DIAGNOSIS — W19XXXA Unspecified fall, initial encounter: Secondary | ICD-10-CM | POA: Diagnosis not present

## 2019-03-26 DIAGNOSIS — Z7901 Long term (current) use of anticoagulants: Secondary | ICD-10-CM | POA: Insufficient documentation

## 2019-03-26 DIAGNOSIS — S52571A Other intraarticular fracture of lower end of right radius, initial encounter for closed fracture: Secondary | ICD-10-CM | POA: Diagnosis not present

## 2019-03-26 DIAGNOSIS — M545 Low back pain: Secondary | ICD-10-CM | POA: Insufficient documentation

## 2019-03-26 DIAGNOSIS — Y999 Unspecified external cause status: Secondary | ICD-10-CM | POA: Insufficient documentation

## 2019-03-26 DIAGNOSIS — S6991XA Unspecified injury of right wrist, hand and finger(s), initial encounter: Secondary | ICD-10-CM | POA: Diagnosis present

## 2019-03-26 DIAGNOSIS — R52 Pain, unspecified: Secondary | ICD-10-CM | POA: Diagnosis not present

## 2019-03-26 DIAGNOSIS — M25531 Pain in right wrist: Secondary | ICD-10-CM | POA: Diagnosis not present

## 2019-03-26 DIAGNOSIS — Y939 Activity, unspecified: Secondary | ICD-10-CM | POA: Diagnosis not present

## 2019-03-26 MED ORDER — HYDROCODONE-ACETAMINOPHEN 5-325 MG PO TABS
1.0000 | ORAL_TABLET | Freq: Once | ORAL | Status: AC
  Administered 2019-03-26: 1 via ORAL
  Filled 2019-03-26: qty 1

## 2019-03-26 MED ORDER — HYDROCODONE-ACETAMINOPHEN 5-325 MG PO TABS: 1.0000 | ORAL_TABLET | Freq: Four times a day (QID) | ORAL | 0 refills | Status: DC | PRN

## 2019-03-26 NOTE — Discharge Instructions (Signed)
Your x-rays today show a broken wrist.  This will likely require surgery.  I have discussed your care with Dr. Aline Brochure of the orthopedic service who has requested to see you in the office sometime in the next 5 to 7 days.  Please call first thing in the morning for the next available appointment.  You will need to elevate your arm on a pillow, ice, anti-inflammatory such as ibuprofen, low-dose 3 times a day, hydrocodone only for severe pain.  Because you are on a blood thinner it will likely cause more swelling.  Please seek medical exam for worsening pain numbness or weakness.  ER for worsening symptoms.

## 2019-03-26 NOTE — ED Provider Notes (Signed)
[facility] Provider Note   CSN: [id number] Arrival date & time: 03/26/19  1411     History   Chief Complaint Chief Complaint  Patient presents with  . Arm Injury    right wrist    HPI [name] is a 85 y.o. female.     HPI  This patient is a very pleasant 85 year old female, known history of arthritis, atrial flutter, prior history of blood clots and currently taking the following medications, Eliquis and amiodarone.  She presents after having a fall, this occurred when she was at home, she was changing positions, lost her balance which she states is not infrequent for her and fell landing on her hand and wrist.  She reports that she developed acute onset of pain in her wrist with a deformity, she landed on her buttocks on a carpeted floor and was able to crawl over to her phone to call for help.  The paramedics transported the patient.  She complains only of pain in the right wrist which is worse with movement and better when she holds still.  She declines pain medications.  This occurred just prior to arrival.  Otherwise the patient has not had any specific complaints recently, no fevers chills nausea vomiting chest pain shortness of breath headaches or any other complaints  Past Medical History:  Diagnosis Date  . Arthritis   . Atrial flutter ([place])    s/p RFA 2003  . Breast cancer ([place])    1980's - lumpectomy only  . Colon polyps   . Diverticulosis   . DVT, lower extremity ([place]) 1998 and 1999   Bilateral  . Essential hypertension   . GERD (gastroesophageal reflux disease)   . Glaucoma   . IBS (irritable bowel syndrome)   . Mixed hyperlipidemia   . Osteoporosis   . Persistent atrial fibrillation ([place])   . Pneumonia 2011  . Sick sinus syndrome ([facility])    s/p PPM ([facility])  . Urinary tract infection   . Varicose veins     Patient Active Problem List   Diagnosis Date Noted  . Osteoporosis 11/25/2018  . B12 deficiency 11/25/2018  .  Atypical chest pain 09/01/2015  . GERD (gastroesophageal reflux disease) 09/01/2015  . Abdominal pain, epigastric 09/01/2015  . Persistent atrial fibrillation ([place]) 09/01/2015  . Chronic anticoagulation 09/01/2015  . History of total knee arthroplasty 02/10/2015  . Chest pain 04/18/2014  . Addison anemia 12/08/2013  . A-fib ([place]) 12/08/2013  . Sick sinus syndrome ([place]) 03/22/2011  . Hyperlipidemia 02/02/2009  . Essential hypertension, benign 02/02/2009  . Paroxysmal atrial fibrillation ([place]) 05/15/2008  . COLONIC POLYPS, ADENOMATOUS, HX OF 05/15/2008    Past Surgical History:  Procedure Laterality Date  . BREAST LUMPECTOMY  1980's   Right breast   . CARDIAC CATHETERIZATION  1999   Normal coronaries  . Cataracts Bilateral   . CHOLECYSTECTOMY    . COLONOSCOPY W/ BIOPSIES AND POLYPECTOMY  03/2003, 05/2008, 02/20/2011   severe diverticulosis, tubulovillous adenoma polyp, internal and external hemorrhoids 2012: severe diverticulosis, 4 mm polyp, hemorrhoids  . CYST REMOVAL TRUNK     under breast = on belly   . DILATION AND CURETTAGE OF UTERUS    . EYE SURGERY     cataracts - bilateral   . Fractured wrist Left 2013   repair  . INSERT / REPLACE / REMOVE PACEMAKER    . KNEE ARTHROSCOPY WITH MEDIAL MENISECTOMY Right 10/21/2014   Procedure: RIGHT KNEE ARTHROSCOPY WITH MEDIAL MENISECTOM,lateral menisectomy,synovectomy  suprapatellar pouch;  Surgeon: Latanya Maudlin, MD;  Location: [facility];  Service: Orthopedics;  Laterality: Right;  . PACEMAKER GENERATOR CHANGE  06/18/12   [facility] DR RF, Dr Rayann Heman  . PACEMAKER INSERTION  2003  . PERMANENT PACEMAKER GENERATOR CHANGE N/A 06/18/2012   Procedure: PERMANENT PACEMAKER GENERATOR CHANGE;  Surgeon: Thompson Grayer, MD;  Location: [facility] CATH LAB;  Service: Cardiovascular;  Laterality: N/A;  . TOTAL KNEE ARTHROPLASTY  12/20/2011   Procedure: TOTAL KNEE ARTHROPLASTY;  Surgeon: Tobi Bastos, MD;  Location: [facility];  Service: Orthopedics;  Laterality: Left;   . TOTAL KNEE ARTHROPLASTY Right 02/10/2015   Procedure: TOTAL RIGHT KNEE ARTHROPLASTY;  Surgeon: Latanya Maudlin, MD;  Location: [facility];  Service: Orthopedics;  Laterality: Right;     OB History   No obstetric history on file.      Home Medications    Prior to Admission medications   Medication Sig Start Date End Date Taking? Authorizing Provider  acetaminophen (TYLENOL) 500 MG tablet Take 1,000 mg by mouth every 6 (six) hours as needed for mild pain.    [provider]  alendronate (FOSAMAX) 70 MG tablet TAKE 1 TABLET EVERY WEEK 09/14/16   [provider]  amiodarone (PACERONE) 200 MG tablet Take 1 tablet (200 mg total) by mouth daily. 12/11/18   Fenton, Clint R, PA  apixaban (ELIQUIS) 5 MG TABS tablet Take 1 tablet (5 mg total) by mouth 2 (two) times daily. 07/07/16   Satira Sark, MD  calcium-vitamin D (OSCAL WITH D) 500-200 MG-UNIT tablet Take 1 tablet by mouth daily with breakfast.     [provider]  famotidine (PEPCID) 20 MG tablet TAKE ONE (1) TABLET EACH DAY 03/17/19   Dettinger, Fransisca Kaufmann, MD  latanoprost (XALATAN) 0.005 % ophthalmic solution Place 1 drop into both eyes at bedtime.    [provider]  Multiple Vitamin (MULTIVITAMIN) tablet Take 1 tablet by mouth every morning.     [provider]  nadolol (CORGARD) 40 MG tablet TAKE 1 AND 1/2 TABLETS DAILY 10/29/17   Satira Sark, MD  nitroGLYCERIN (NITROSTAT) 0.4 MG SL tablet Place 1 tablet (0.4 mg total) under the tongue every 5 (five) minutes as needed for chest pain. Up to 3 doses. If no relief after 3 rd dose, proceed to the ED 12/25/16   Satira Sark, MD  omeprazole (PRILOSEC) 20 MG capsule Take 1 capsule (20 mg total) by mouth daily. 09/14/15   Gatha Mayer, MD    Family History Family History  Problem Relation Age of Onset  . Cancer Father   . Stroke Mother   . Alzheimer's disease Brother   . Colon cancer Other        Grandfather  . Heart attack Other         CHF grandmother  . Alzheimer's disease Sister   . Colon cancer Maternal Grandfather   . Cancer Son        prostate   . Colon cancer Sister   . Colon cancer Brother   . Heart disease Brother   . Atrial fibrillation Brother   . Diabetes Brother   . Cancer Son        prostate    Social History Social History   Tobacco Use  . Smoking status: Never Smoker  . Smokeless tobacco: Never Used  Substance Use Topics  . Alcohol use: No    Alcohol/week: 0.0 standard drinks  . Drug use: No  Allergies   Iodinated diagnostic agents and Naproxen   Review of Systems Review of Systems  All other systems reviewed and are negative.    Physical Exam Updated Vital Signs BP (!) 149/83 (BP Location: Left Arm)   Pulse 60   Temp 97.9 F (36.6 C) (Oral)   Resp 15   Ht 1.626 m (5\' 4" )   Wt 82.6 kg   SpO2 100%   BMI 31.24 kg/m   Physical Exam Vitals signs and nursing note reviewed.  Constitutional:      General: She is not in acute distress.    Appearance: She is well-developed.  HENT:     Head: Normocephalic and atraumatic.     Mouth/Throat:     Pharynx: No oropharyngeal exudate.  Eyes:     General: No scleral icterus.       Right eye: No discharge.        Left eye: No discharge.     Conjunctiva/sclera: Conjunctivae normal.     Pupils: Pupils are equal, round, and reactive to light.  Neck:     Musculoskeletal: Normal range of motion and neck supple.     Thyroid: No thyromegaly.     Vascular: No JVD.  Cardiovascular:     Rate and Rhythm: Normal rate and regular rhythm.     Heart sounds: Normal heart sounds. No murmur. No friction rub. No gallop.   Pulmonary:     Effort: Pulmonary effort is normal. No respiratory distress.     Breath sounds: Normal breath sounds. No wheezing or rales.  Abdominal:     General: Bowel sounds are normal. There is no distension.     Palpations: Abdomen is soft. There is no mass.     Tenderness: There is no abdominal tenderness.   Musculoskeletal: Normal range of motion.        General: Swelling, tenderness and deformity present.     Comments: There is right distal forearm deformity at the wrist, she has normal pulses, normal capillary refill, normal sensation in all the fingers.  No other trauma.  She has mild tenderness over the lumbar spine and the paraspinal muscles.  The range of motion of the shoulder and elbow are normal on the right side.  Lymphadenopathy:     Cervical: No cervical adenopathy.  Skin:    General: Skin is warm and dry.     Findings: No erythema or rash.  Neurological:     Mental Status: She is alert.     Coordination: Coordination normal.     Comments: Normal strength and sensation, grip on the right side is weak secondary to pain but has normal sensation and ability to follow commands  Psychiatric:        Behavior: Behavior normal.      ED Treatments / Results  Labs (all labs ordered are listed, but only abnormal results are displayed) Labs Reviewed - No data to display  EKG None  Radiology No results found.  Procedures .Splint Application  Date/Time: 03/26/2019 3:35 PM Performed by: Noemi Chapel, MD Authorized by: Noemi Chapel, MD   Consent:    Consent obtained:  Verbal   Consent given by:  Patient   Risks discussed:  Discoloration, numbness, pain and swelling   Alternatives discussed:  No treatment Pre-procedure details:    Sensation:  Normal Procedure details:    Laterality:  Right   Location:  Wrist   Splint type: reverse sugar tong.   Supplies:  Ortho-Glass, cotton padding, sling and elastic  bandage Post-procedure details:    Pain:  Improved   Sensation:  Normal   Skin color:  Normal   Patient tolerance of procedure:  Tolerated well, no immediate complications Comments:         (including critical care time)  Medications Ordered in ED Medications  HYDROcodone-acetaminophen (NORCO/VICODIN) 5-325 MG per tablet 1 tablet (has no administration in time  range)     Initial Impression / Assessment and Plan / ED Course  I have reviewed the triage vital signs and the nursing notes.  Pertinent labs & imaging results that were available during my care of the patient were reviewed by me and considered in my medical decision making (see chart for details).       At this time the patient will undergo x-ray of the wrist forearm and lumbar spine, she otherwise appears well, her vital signs are reassuring.  I discussed the case with Dr. Aline Brochure of the orthopedic service who recommends outpatient follow-up, to place a volar sugar tong splint, sling, will discuss with the patient.  On my evaluation the patient does have a comminuted intra-articular mildly displaced and angulated distal radial fracture with an ulnar styloid fracture as well, awaiting radiology interpretation  The patient's neurovascular status was rechecked after the splint was placed, she has good vascular flow, good sensation, her pain level is slightly improved, she was given a single dose of pain medication.  I discussed the care with Dr. Aline Brochure of the orthopedic service who states that he will follow up with the patient in the outpatient setting.  He recommends follow-up within the next 5 to 7 days.  Final Clinical Impressions(s) / ED Diagnoses   Final diagnoses:  Other closed intra-articular fracture of distal end of right radius, initial encounter      Noemi Chapel, MD 03/26/19 1554

## 2019-03-26 NOTE — ED Triage Notes (Signed)
Patient fell at her home around 1100 injuring right wrist. Patient denies being dizzy or tripping over anything in her home.

## 2019-03-27 ENCOUNTER — Telehealth: Payer: Self-pay | Admitting: *Deleted

## 2019-03-27 ENCOUNTER — Ambulatory Visit (INDEPENDENT_AMBULATORY_CARE_PROVIDER_SITE_OTHER): Payer: Medicare Other | Admitting: Family Medicine

## 2019-03-27 DIAGNOSIS — M25531 Pain in right wrist: Secondary | ICD-10-CM | POA: Diagnosis not present

## 2019-03-27 DIAGNOSIS — S52531A Colles' fracture of right radius, initial encounter for closed fracture: Secondary | ICD-10-CM | POA: Diagnosis not present

## 2019-03-27 NOTE — Progress Notes (Signed)
Patient fell and broke her arm yesterday and could not come in and had an appointment with Ortho right now and they were at the appointment, will reschedule

## 2019-03-27 NOTE — Telephone Encounter (Signed)
Patient called stating that she fell and broke her arm. [facility]iana [facility] is wanting to do surgery. Patient was told to call and enquire about her (ELIQUIS) 5 MG TABS tablet .

## 2019-03-27 NOTE — Telephone Encounter (Signed)
Spoke with daughter in law [place].  Pt saw Dr Caralyn Guile this morning and is scheduled for wrist surgery 03/31/19 at 7am.  They told her to hold Eliquis x 3 days before surgery.  Told [name] per guideline is to hold Eliquis 48 hours before surgery.  She will take last dose of Eliquis the night of 03/28/19 and resume after surgery as cleared by MD.  Awaiting surgical clearance request from Dr Angus Palms office.

## 2019-03-31 DIAGNOSIS — S52531A Colles' fracture of right radius, initial encounter for closed fracture: Secondary | ICD-10-CM | POA: Diagnosis not present

## 2019-03-31 DIAGNOSIS — S52571A Other intraarticular fracture of lower end of right radius, initial encounter for closed fracture: Secondary | ICD-10-CM | POA: Diagnosis not present

## 2019-04-03 ENCOUNTER — Encounter: Payer: Self-pay | Admitting: Family Medicine

## 2019-04-03 ENCOUNTER — Ambulatory Visit (INDEPENDENT_AMBULATORY_CARE_PROVIDER_SITE_OTHER): Payer: Medicare Other | Admitting: Family Medicine

## 2019-04-03 ENCOUNTER — Other Ambulatory Visit: Payer: Self-pay

## 2019-04-03 DIAGNOSIS — I10 Essential (primary) hypertension: Secondary | ICD-10-CM

## 2019-04-03 DIAGNOSIS — E782 Mixed hyperlipidemia: Secondary | ICD-10-CM | POA: Diagnosis not present

## 2019-04-03 DIAGNOSIS — K219 Gastro-esophageal reflux disease without esophagitis: Secondary | ICD-10-CM

## 2019-04-03 NOTE — Progress Notes (Signed)
Virtual Visit via telephone Note  I connected with [name] on 04/03/19 at 1050 by telephone and verified that I am speaking with the correct person using two identifiers. [name] is currently located at home and no other people are currently with her during visit. The provider, Fransisca Kaufmann Dettinger, MD is located in their office at time of visit.  Call ended at 1105  I discussed the limitations, risks, security and privacy concerns of performing an evaluation and management service by telephone and the availability of in person appointments. I also discussed with the patient that there may be a patient responsible charge related to this service. The patient expressed understanding and agreed to proceed.   History and Present Illness: Hypertension, and afib Patient is currently on amiodarone and nadolol and elquis, and their blood pressure today is 147/80. Patient denies any lightheadedness or dizziness. Patient denies headaches, blurred vision, chest pains, shortness of breath, or weakness. Denies any side effects from medication and is content with current medication.   Hyperlipidemia Patient is coming in for recheck of his hyperlipidemia. The patient is currently taking no medication. They deny any issues with myalgias or history of liver damage from it. They deny any focal numbness or weakness or chest pain.   GERD Patient is currently on omeprazole.  She denies any major symptoms or abdominal pain or belching or burping. She denies any blood in her stool or lightheadedness or dizziness.   No diagnosis found.  Outpatient Encounter Medications as of 04/03/2019  Medication Sig  . acetaminophen (TYLENOL) 500 MG tablet Take 1,000 mg by mouth every 6 (six) hours as needed for mild pain.  Marland Kitchen alendronate (FOSAMAX) 70 MG tablet TAKE 1 TABLET EVERY WEEK  . amiodarone (PACERONE) 200 MG tablet Take 1 tablet (200 mg total) by mouth daily.  Marland Kitchen apixaban (ELIQUIS) 5 MG TABS  tablet Take 1 tablet (5 mg total) by mouth 2 (two) times daily.  . calcium-vitamin D (OSCAL WITH D) 500-200 MG-UNIT tablet Take 1 tablet by mouth daily with breakfast.   . famotidine (PEPCID) 20 MG tablet TAKE ONE (1) TABLET EACH DAY  . HYDROcodone-acetaminophen (NORCO/VICODIN) 5-325 MG tablet Take 1 tablet by mouth every 6 (six) hours as needed.  . latanoprost (XALATAN) 0.005 % ophthalmic solution Place 1 drop into both eyes at bedtime.  . Multiple Vitamin (MULTIVITAMIN) tablet Take 1 tablet by mouth every morning.   . nadolol (CORGARD) 40 MG tablet TAKE 1 AND 1/2 TABLETS DAILY  . nitroGLYCERIN (NITROSTAT) 0.4 MG SL tablet Place 1 tablet (0.4 mg total) under the tongue every 5 (five) minutes as needed for chest pain. Up to 3 doses. If no relief after 3 rd dose, proceed to the ED  . omeprazole (PRILOSEC) 20 MG capsule Take 1 capsule (20 mg total) by mouth daily.   Facility-Administered Encounter Medications as of 04/03/2019  Medication  . bupivacaine liposome (EXPAREL) 1.3 % injection 266 mg  . cyanocobalamin ((VITAMIN B-12)) injection 1,000 mcg    Review of Systems  Constitutional: Negative for chills and fever.  Eyes: Negative for visual disturbance.  Respiratory: Negative for chest tightness and shortness of breath.   Cardiovascular: Negative for chest pain and leg swelling.  Gastrointestinal: Negative for abdominal pain.  Musculoskeletal: Negative for back pain and gait problem.  Skin: Negative for rash.  Neurological: Negative for light-headedness and headaches.  Psychiatric/Behavioral: Negative for agitation and behavioral problems.  All other systems reviewed and are negative.   Observations/Objective: Patient  sounds comfortable and in no acute distress  Assessment and Plan: Problem List Items Addressed This Visit      Cardiovascular and Mediastinum   Essential hypertension, benign (Chronic)     Digestive   GERD (gastroesophageal reflux disease)     Other    Hyperlipidemia - Primary (Chronic)      No change in current medication, sounds like patient is doing well Follow Up Instructions: Follow up in 3 months for htn    I discussed the assessment and treatment plan with the patient. The patient was provided an opportunity to ask questions and all were answered. The patient agreed with the plan and demonstrated an understanding of the instructions.   The patient was advised to call back or seek an in-person evaluation if the symptoms worsen or if the condition fails to improve as anticipated.  The above assessment and management plan was discussed with the patient. The patient verbalized understanding of and has agreed to the management plan. Patient is aware to call the clinic if symptoms persist or worsen. Patient is aware when to return to the clinic for a follow-up visit. Patient educated on when it is appropriate to go to the emergency department.    I provided 15 minutes of non-face-to-face time during this encounter.    Worthy Rancher, MD

## 2019-04-04 NOTE — Progress Notes (Signed)
Remote pacemaker transmission.   

## 2019-04-15 DIAGNOSIS — S52531A Colles' fracture of right radius, initial encounter for closed fracture: Secondary | ICD-10-CM | POA: Diagnosis not present

## 2019-04-17 ENCOUNTER — Other Ambulatory Visit: Payer: Self-pay

## 2019-04-17 ENCOUNTER — Telehealth: Payer: Self-pay | Admitting: Family Medicine

## 2019-04-17 DIAGNOSIS — I480 Paroxysmal atrial fibrillation: Secondary | ICD-10-CM

## 2019-04-17 DIAGNOSIS — M810 Age-related osteoporosis without current pathological fracture: Secondary | ICD-10-CM

## 2019-04-17 DIAGNOSIS — I10 Essential (primary) hypertension: Secondary | ICD-10-CM

## 2019-04-17 DIAGNOSIS — E782 Mixed hyperlipidemia: Secondary | ICD-10-CM

## 2019-04-17 NOTE — Telephone Encounter (Signed)
Aware, can get blood work done.

## 2019-04-17 NOTE — Telephone Encounter (Signed)
Placed orders for the patient.

## 2019-04-17 NOTE — Telephone Encounter (Signed)
Please advise if lab work could be ordered.

## 2019-04-18 ENCOUNTER — Ambulatory Visit: Payer: Medicare Other | Admitting: *Deleted

## 2019-04-18 DIAGNOSIS — I10 Essential (primary) hypertension: Secondary | ICD-10-CM

## 2019-04-18 DIAGNOSIS — M810 Age-related osteoporosis without current pathological fracture: Secondary | ICD-10-CM

## 2019-04-18 DIAGNOSIS — I480 Paroxysmal atrial fibrillation: Secondary | ICD-10-CM

## 2019-04-18 DIAGNOSIS — E782 Mixed hyperlipidemia: Secondary | ICD-10-CM

## 2019-04-18 NOTE — Progress Notes (Signed)
Vitamin b12 injection given and tolerated well.  

## 2019-04-19 LAB — CBC WITH DIFFERENTIAL/PLATELET
Basophils Absolute: 0 10*3/uL (ref 0.0–0.2)
Basos: 1 %
EOS (ABSOLUTE): 0.2 10*3/uL (ref 0.0–0.4)
Eos: 3 %
Hematocrit: 36.4 % (ref 34.0–46.6)
Hemoglobin: 12.5 g/dL (ref 11.1–15.9)
Immature Grans (Abs): 0 10*3/uL (ref 0.0–0.1)
Immature Granulocytes: 0 %
Lymphocytes Absolute: 1.8 10*3/uL (ref 0.7–3.1)
Lymphs: 30 %
MCH: 34 pg — ABNORMAL HIGH (ref 26.6–33.0)
MCHC: 34.3 g/dL (ref 31.5–35.7)
MCV: 99 fL — ABNORMAL HIGH (ref 79–97)
Monocytes Absolute: 0.7 10*3/uL (ref 0.1–0.9)
Monocytes: 12 %
Neutrophils Absolute: 3.2 10*3/uL (ref 1.4–7.0)
Neutrophils: 54 %
Platelets: 186 10*3/uL (ref 150–450)
RBC: 3.68 x10E6/uL — ABNORMAL LOW (ref 3.77–5.28)
RDW: 13.2 % (ref 11.7–15.4)
WBC: 5.9 10*3/uL (ref 3.4–10.8)

## 2019-04-19 LAB — LIPID PANEL
Chol/HDL Ratio: 3 ratio (ref 0.0–4.4)
Cholesterol, Total: 184 mg/dL (ref 100–199)
HDL: 61 mg/dL (ref >39)
LDL Chol Calc (NIH): 111 mg/dL — ABNORMAL HIGH (ref 0–99)
Triglycerides: 61 mg/dL (ref 0–149)
VLDL Cholesterol Cal: 12 mg/dL (ref 5–40)

## 2019-04-19 LAB — CMP14+EGFR
ALT: 13 IU/L (ref 0–32)
AST: 17 IU/L (ref 0–40)
Albumin/Globulin Ratio: 1.8 (ref 1.2–2.2)
Albumin: 4.6 g/dL (ref 3.6–4.6)
Alkaline Phosphatase: 73 IU/L (ref 39–117)
BUN/Creatinine Ratio: 13 (ref 12–28)
BUN: 11 mg/dL (ref 8–27)
Bilirubin Total: 0.8 mg/dL (ref 0.0–1.2)
CO2: 25 mmol/L (ref 20–29)
Calcium: 9.9 mg/dL (ref 8.7–10.3)
Chloride: 101 mmol/L (ref 96–106)
Creatinine, Ser: 0.85 mg/dL (ref 0.57–1.00)
GFR calc Af Amer: 71 mL/min/{1.73_m2} (ref >59)
GFR calc non Af Amer: 62 mL/min/{1.73_m2} (ref >59)
Globulin, Total: 2.5 g/dL (ref 1.5–4.5)
Glucose: 102 mg/dL — ABNORMAL HIGH (ref 65–99)
Potassium: 4.1 mmol/L (ref 3.5–5.2)
Sodium: 141 mmol/L (ref 134–144)
Total Protein: 7.1 g/dL (ref 6.0–8.5)

## 2019-04-19 LAB — VITAMIN D 25 HYDROXY (VIT D DEFICIENCY, FRACTURES): Vit D, 25-Hydroxy: 49.1 ng/mL (ref 30.0–100.0)

## 2019-04-29 DIAGNOSIS — S52531A Colles' fracture of right radius, initial encounter for closed fracture: Secondary | ICD-10-CM | POA: Diagnosis not present

## 2019-04-29 DIAGNOSIS — M25531 Pain in right wrist: Secondary | ICD-10-CM | POA: Diagnosis not present

## 2019-05-05 DIAGNOSIS — W19XXXS Unspecified fall, sequela: Secondary | ICD-10-CM | POA: Diagnosis not present

## 2019-05-05 DIAGNOSIS — M25631 Stiffness of right wrist, not elsewhere classified: Secondary | ICD-10-CM | POA: Diagnosis not present

## 2019-05-05 DIAGNOSIS — M6281 Muscle weakness (generalized): Secondary | ICD-10-CM | POA: Diagnosis not present

## 2019-05-05 DIAGNOSIS — S52531S Colles' fracture of right radius, sequela: Secondary | ICD-10-CM | POA: Diagnosis not present

## 2019-05-05 DIAGNOSIS — M25531 Pain in right wrist: Secondary | ICD-10-CM | POA: Diagnosis not present

## 2019-05-07 DIAGNOSIS — S52531S Colles' fracture of right radius, sequela: Secondary | ICD-10-CM | POA: Diagnosis not present

## 2019-05-07 DIAGNOSIS — M6281 Muscle weakness (generalized): Secondary | ICD-10-CM | POA: Diagnosis not present

## 2019-05-07 DIAGNOSIS — M25531 Pain in right wrist: Secondary | ICD-10-CM | POA: Diagnosis not present

## 2019-05-07 DIAGNOSIS — M25631 Stiffness of right wrist, not elsewhere classified: Secondary | ICD-10-CM | POA: Diagnosis not present

## 2019-05-07 DIAGNOSIS — W19XXXS Unspecified fall, sequela: Secondary | ICD-10-CM | POA: Diagnosis not present

## 2019-05-12 DIAGNOSIS — W19XXXS Unspecified fall, sequela: Secondary | ICD-10-CM | POA: Diagnosis not present

## 2019-05-12 DIAGNOSIS — M25531 Pain in right wrist: Secondary | ICD-10-CM | POA: Diagnosis not present

## 2019-05-12 DIAGNOSIS — M6281 Muscle weakness (generalized): Secondary | ICD-10-CM | POA: Diagnosis not present

## 2019-05-12 DIAGNOSIS — S52531S Colles' fracture of right radius, sequela: Secondary | ICD-10-CM | POA: Diagnosis not present

## 2019-05-12 DIAGNOSIS — M25631 Stiffness of right wrist, not elsewhere classified: Secondary | ICD-10-CM | POA: Diagnosis not present

## 2019-05-14 DIAGNOSIS — M25631 Stiffness of right wrist, not elsewhere classified: Secondary | ICD-10-CM | POA: Diagnosis not present

## 2019-05-14 DIAGNOSIS — S52531S Colles' fracture of right radius, sequela: Secondary | ICD-10-CM | POA: Diagnosis not present

## 2019-05-14 DIAGNOSIS — M6281 Muscle weakness (generalized): Secondary | ICD-10-CM | POA: Diagnosis not present

## 2019-05-14 DIAGNOSIS — W19XXXS Unspecified fall, sequela: Secondary | ICD-10-CM | POA: Diagnosis not present

## 2019-05-14 DIAGNOSIS — M25531 Pain in right wrist: Secondary | ICD-10-CM | POA: Diagnosis not present

## 2019-05-19 ENCOUNTER — Other Ambulatory Visit: Payer: Self-pay

## 2019-05-19 ENCOUNTER — Ambulatory Visit (INDEPENDENT_AMBULATORY_CARE_PROVIDER_SITE_OTHER): Payer: Medicare Other

## 2019-05-19 DIAGNOSIS — E538 Deficiency of other specified B group vitamins: Secondary | ICD-10-CM | POA: Diagnosis not present

## 2019-05-19 NOTE — Progress Notes (Signed)
Cyanocobalamin injection given to right deltoid.  Patient tolerated well. 

## 2019-05-21 DIAGNOSIS — S52531S Colles' fracture of right radius, sequela: Secondary | ICD-10-CM | POA: Diagnosis not present

## 2019-05-21 DIAGNOSIS — M25531 Pain in right wrist: Secondary | ICD-10-CM | POA: Diagnosis not present

## 2019-05-21 DIAGNOSIS — M25631 Stiffness of right wrist, not elsewhere classified: Secondary | ICD-10-CM | POA: Diagnosis not present

## 2019-05-21 DIAGNOSIS — M6281 Muscle weakness (generalized): Secondary | ICD-10-CM | POA: Diagnosis not present

## 2019-05-21 DIAGNOSIS — W19XXXS Unspecified fall, sequela: Secondary | ICD-10-CM | POA: Diagnosis not present

## 2019-05-23 ENCOUNTER — Encounter: Payer: Self-pay | Admitting: Cardiology

## 2019-05-23 ENCOUNTER — Ambulatory Visit: Payer: Medicare Other | Admitting: Cardiology

## 2019-05-23 DIAGNOSIS — M25631 Stiffness of right wrist, not elsewhere classified: Secondary | ICD-10-CM | POA: Diagnosis not present

## 2019-05-23 DIAGNOSIS — M25531 Pain in right wrist: Secondary | ICD-10-CM | POA: Diagnosis not present

## 2019-05-23 DIAGNOSIS — S52531S Colles' fracture of right radius, sequela: Secondary | ICD-10-CM | POA: Diagnosis not present

## 2019-05-23 DIAGNOSIS — M6281 Muscle weakness (generalized): Secondary | ICD-10-CM | POA: Diagnosis not present

## 2019-05-23 DIAGNOSIS — W19XXXS Unspecified fall, sequela: Secondary | ICD-10-CM | POA: Diagnosis not present

## 2019-05-23 NOTE — Progress Notes (Deleted)
Cardiology Office Note  Date: 05/23/2019   ID: [name], DOB 27-Mar-1929, MRN [id number]  PCP:  Dettinger, Fransisca Kaufmann, MD  Cardiologist:  Rozann Lesches, MD Electrophysiologist:  None   No chief complaint on file.   History of Present Illness: [name] is an 85 y.o. female last assessed via telehealth encounter in August.  She follows with Dr. Rayann Heman in the device clinic, [place] pacemaker in place.  Device interrogation in October revealed 23% AF burden that was stable.  I reviewed her recent lab work as outlined below.  Past Medical History:  Diagnosis Date  . Arthritis   . Atrial flutter ([place])    s/p RFA 2003  . Breast cancer ([facility])    1980's - lumpectomy only  . Colon polyps   . Diverticulosis   . DVT, lower extremity ([place]) 1998 and 1999   Bilateral  . Essential hypertension   . GERD (gastroesophageal reflux disease)   . Glaucoma   . IBS (irritable bowel syndrome)   . Mixed hyperlipidemia   . Osteoporosis   . Persistent atrial fibrillation ([place])   . Pneumonia 2011  . Sick sinus syndrome ([facility])    PPM ([facility])  . Urinary tract infection   . Varicose veins     Past Surgical History:  Procedure Laterality Date  . BREAST LUMPECTOMY  1980's   Right breast   . CARDIAC CATHETERIZATION  1999   Normal coronaries  . Cataracts Bilateral   . CHOLECYSTECTOMY    . COLONOSCOPY W/ BIOPSIES AND POLYPECTOMY  03/2003, 05/2008, 02/20/2011   severe diverticulosis, tubulovillous adenoma polyp, internal and external hemorrhoids 2012: severe diverticulosis, 4 mm polyp, hemorrhoids  . CYST REMOVAL TRUNK     under breast = on belly   . DILATION AND CURETTAGE OF UTERUS    . EYE SURGERY     cataracts - bilateral   . Fractured wrist Left 2013   repair  . INSERT / REPLACE / REMOVE PACEMAKER    . KNEE ARTHROSCOPY WITH MEDIAL MENISECTOMY Right 10/21/2014   Procedure: RIGHT KNEE ARTHROSCOPY WITH MEDIAL MENISECTOM,lateral menisectomy,synovectomy suprapatellar  pouch;  Surgeon: Latanya Maudlin, MD;  Location: [facility];  Service: Orthopedics;  Laterality: Right;  . PACEMAKER GENERATOR CHANGE  06/18/12   [facility] DR RF, Dr Rayann Heman  . PACEMAKER INSERTION  2003  . PERMANENT PACEMAKER GENERATOR CHANGE N/A 06/18/2012   Procedure: PERMANENT PACEMAKER GENERATOR CHANGE;  Surgeon: Thompson Grayer, MD;  Location: [facility] CATH LAB;  Service: Cardiovascular;  Laterality: N/A;  . TOTAL KNEE ARTHROPLASTY  12/20/2011   Procedure: TOTAL KNEE ARTHROPLASTY;  Surgeon: Tobi Bastos, MD;  Location: [facility];  Service: Orthopedics;  Laterality: Left;  . TOTAL KNEE ARTHROPLASTY Right 02/10/2015   Procedure: TOTAL RIGHT KNEE ARTHROPLASTY;  Surgeon: Latanya Maudlin, MD;  Location: [facility];  Service: Orthopedics;  Laterality: Right;    Current Outpatient Medications  Medication Sig Dispense Refill  . acetaminophen (TYLENOL) 500 MG tablet Take 1,000 mg by mouth every 6 (six) hours as needed for mild pain.    Marland Kitchen alendronate (FOSAMAX) 70 MG tablet TAKE 1 TABLET EVERY WEEK    . amiodarone (PACERONE) 200 MG tablet Take 1 tablet (200 mg total) by mouth daily. 15 tablet 6  . apixaban (ELIQUIS) 5 MG TABS tablet Take 1 tablet (5 mg total) by mouth 2 (two) times daily. 60 tablet 6  . calcium-vitamin D (OSCAL WITH D) 500-200 MG-UNIT tablet Take 1 tablet by mouth daily with breakfast.     .  famotidine (PEPCID) 20 MG tablet TAKE ONE (1) TABLET EACH DAY 30 tablet 11  . HYDROcodone-acetaminophen (NORCO/VICODIN) 5-325 MG tablet Take 1 tablet by mouth every 6 (six) hours as needed. 10 tablet 0  . latanoprost (XALATAN) 0.005 % ophthalmic solution Place 1 drop into both eyes at bedtime.    . Multiple Vitamin (MULTIVITAMIN) tablet Take 1 tablet by mouth every morning.     . nadolol (CORGARD) 40 MG tablet TAKE 1 AND 1/2 TABLETS DAILY 135 tablet 0  . nitroGLYCERIN (NITROSTAT) 0.4 MG SL tablet Place 1 tablet (0.4 mg total) under the tongue every 5 (five) minutes as needed for chest pain. Up to 3 doses. If no relief  after 3 rd dose, proceed to the ED 75 tablet 1  . omeprazole (PRILOSEC) 20 MG capsule Take 1 capsule (20 mg total) by mouth daily. 30 capsule 11   Current Facility-Administered Medications  Medication Dose Route Frequency Provider Last Rate Last Admin  . cyanocobalamin ((VITAMIN B-12)) injection 1,000 mcg  1,000 mcg Intramuscular Q30 days Dettinger, Fransisca Kaufmann, MD   1,000 mcg at 05/19/19 [id number]   Facility-Administered Medications Ordered in Other Visits  Medication Dose Route Frequency Provider Last Rate Last Admin  . bupivacaine liposome (EXPAREL) 1.3 % injection 266 mg  20 mL Infiltration Once Constable, [employer], PA-C       Allergies:  Iodinated diagnostic agents and Naproxen   Social History: The patient  reports that she has never smoked. She has never used smokeless tobacco. She reports that she does not drink alcohol or use drugs.   Family History: The patient's family history includes Alzheimer's disease in her brother and sister; Atrial fibrillation in her brother; Cancer in her father, son, and son; Colon cancer in her brother, maternal grandfather, sister, and another family member; Diabetes in her brother; Heart attack in an other family member; Heart disease in her brother; Stroke in her mother.   ROS:  Please see the history of present illness. Otherwise, complete review of systems is positive for {NONE DEFAULTED:18576::"none"}.  All other systems are reviewed and negative.   Physical Exam: VS:  There were no vitals taken for this visit., BMI There is no height or weight on file to calculate BMI.  Wt Readings from Last 3 Encounters:  03/26/19 182 lb (82.6 kg)  01/16/19 182 lb (82.6 kg)  12/24/18 181 lb (82.1 kg)    General: Patient appears comfortable at rest. HEENT: Conjunctiva and lids normal, oropharynx clear with moist mucosa. Neck: Supple, no elevated JVP or carotid bruits, no thyromegaly. Lungs: Clear to auscultation, nonlabored breathing at rest. Cardiac: Regular rate and  rhythm, no S3 or significant systolic murmur, no pericardial rub. Abdomen: Soft, nontender, no hepatomegaly, bowel sounds present, no guarding or rebound. Extremities: No pitting edema, distal pulses 2+. Skin: Warm and dry. Musculoskeletal: No kyphosis. Neuropsychiatric: Alert and oriented x3, affect grossly appropriate.  ECG:  An ECG dated 12/03/2018 was personally reviewed today and demonstrated:  Dual-chamber and ventricular pacing.  Recent Labwork: 04/18/2019: ALT 13; AST 17; BUN 11; Creatinine, Ser 0.85; Hemoglobin 12.5; Platelets 186; Potassium 4.1; Sodium 141     Component Value Date/Time   CHOL 184 04/18/2019 1056   TRIG 61 04/18/2019 1056   HDL 61 04/18/2019 1056   CHOLHDL 3.0 04/18/2019 1056   LDLCALC 111 (H) 04/18/2019 1056    Other Studies Reviewed Today:  Lexiscan Myoview 08/01/2012: No perfusion evidence of scar or ischemia with LVEF 63%.  Assessment and Plan:    Medication  Adjustments/Labs and Tests Ordered: Current medicines are reviewed at length with the patient today.  Concerns regarding medicines are outlined above.   Tests Ordered: No orders of the defined types were placed in this encounter.   Medication Changes: No orders of the defined types were placed in this encounter.   Disposition:  Follow up {follow up:15908}  Signed, Satira Sark, MD, [facility] 05/23/2019 12:51 PM    [facility] at [place] [place], [place], [place] [address] Phone: [phone]; Fax: [phone]

## 2019-06-19 ENCOUNTER — Other Ambulatory Visit: Payer: Self-pay | Admitting: Family Medicine

## 2019-06-19 ENCOUNTER — Ambulatory Visit (INDEPENDENT_AMBULATORY_CARE_PROVIDER_SITE_OTHER): Payer: Medicare Other | Admitting: *Deleted

## 2019-06-19 ENCOUNTER — Ambulatory Visit (INDEPENDENT_AMBULATORY_CARE_PROVIDER_SITE_OTHER): Payer: Medicare Other

## 2019-06-19 ENCOUNTER — Other Ambulatory Visit: Payer: Self-pay | Admitting: Cardiology

## 2019-06-19 ENCOUNTER — Other Ambulatory Visit: Payer: Self-pay

## 2019-06-19 ENCOUNTER — Other Ambulatory Visit: Payer: Self-pay | Admitting: *Deleted

## 2019-06-19 DIAGNOSIS — M810 Age-related osteoporosis without current pathological fracture: Secondary | ICD-10-CM

## 2019-06-19 DIAGNOSIS — M8589 Other specified disorders of bone density and structure, multiple sites: Secondary | ICD-10-CM | POA: Diagnosis not present

## 2019-06-19 DIAGNOSIS — E538 Deficiency of other specified B group vitamins: Secondary | ICD-10-CM | POA: Diagnosis not present

## 2019-06-19 MED ORDER — NADOLOL 40 MG PO TABS: 60.0000 mg | ORAL_TABLET | Freq: Every day | ORAL | 0 refills | Status: DC

## 2019-06-19 MED ORDER — NITROGLYCERIN 0.4 MG SL SUBL: 0.4000 mg | SUBLINGUAL_TABLET | SUBLINGUAL | 1 refills | Status: DC | PRN

## 2019-06-19 MED ORDER — AMIODARONE HCL 200 MG PO TABS: 200.0000 mg | ORAL_TABLET | Freq: Every day | ORAL | 6 refills | Status: DC

## 2019-06-19 MED ORDER — PANTOPRAZOLE SODIUM 40 MG PO TBEC: 40.0000 mg | DELAYED_RELEASE_TABLET | Freq: Every day | ORAL | 0 refills | Status: DC

## 2019-06-19 MED ORDER — APIXABAN 5 MG PO TABS: 5.0000 mg | ORAL_TABLET | Freq: Two times a day (BID) | ORAL | 6 refills | Status: DC

## 2019-06-19 NOTE — Progress Notes (Signed)
Vitamin b12 injection given and tolerated well.  

## 2019-06-19 NOTE — Telephone Encounter (Signed)
Patient needs a refill for all pended medications.

## 2019-06-20 ENCOUNTER — Ambulatory Visit: Payer: Medicare Other | Admitting: Cardiology

## 2019-06-24 ENCOUNTER — Ambulatory Visit (INDEPENDENT_AMBULATORY_CARE_PROVIDER_SITE_OTHER): Payer: Medicare Other | Admitting: *Deleted

## 2019-06-24 DIAGNOSIS — I4819 Other persistent atrial fibrillation: Secondary | ICD-10-CM

## 2019-06-24 DIAGNOSIS — S52531A Colles' fracture of right radius, initial encounter for closed fracture: Secondary | ICD-10-CM | POA: Diagnosis not present

## 2019-06-24 LAB — CUP PACEART REMOTE DEVICE CHECK
Battery Remaining Longevity: 56 mo
Battery Remaining Percentage: 57 %
Battery Voltage: 2.89 V
Brady Statistic AP VP Percent: 84 %
Brady Statistic AP VS Percent: 1.6 %
Brady Statistic AS VP Percent: 14 %
Brady Statistic AS VS Percent: 1 %
Brady Statistic RA Percent Paced: 70 %
Brady Statistic RV Percent Paced: 98 %
Date Time Interrogation Session: 20210111035917
Implantable Lead Implant Date: 20031028
Implantable Lead Implant Date: 20031028
Implantable Lead Location: 753859
Implantable Lead Location: 753860
Implantable Pulse Generator Implant Date: 20140107
Lead Channel Impedance Value: 350 Ohm
Lead Channel Impedance Value: 440 Ohm
Lead Channel Pacing Threshold Amplitude: 0.75 V
Lead Channel Pacing Threshold Amplitude: 1 V
Lead Channel Pacing Threshold Pulse Width: 0.5 ms
Lead Channel Pacing Threshold Pulse Width: 0.5 ms
Lead Channel Sensing Intrinsic Amplitude: 0.2 mV
Lead Channel Sensing Intrinsic Amplitude: 11.9 mV
Lead Channel Setting Pacing Amplitude: 2 V
Lead Channel Setting Pacing Amplitude: 2.5 V
Lead Channel Setting Pacing Pulse Width: 0.5 ms
Lead Channel Setting Sensing Sensitivity: 2 mV
Pulse Gen Model: 2210
Pulse Gen Serial Number: 7438567

## 2019-06-26 ENCOUNTER — Ambulatory Visit (INDEPENDENT_AMBULATORY_CARE_PROVIDER_SITE_OTHER): Payer: Medicare Other | Admitting: Family Medicine

## 2019-06-26 NOTE — Progress Notes (Signed)
Patient did not answer and left a voicemail for the patient after 2 calls. Caryl Pina, MD [facility] 06/26/2019, 11:25 AM

## 2019-07-03 NOTE — Progress Notes (Signed)
Cardiology Office Note  Date: 07/04/2019   ID: [name], DOB 1929-06-12, MRN [id number]  PCP:  Dettinger, Fransisca Kaufmann, MD  Cardiologist:  Rozann Lesches, MD Electrophysiologist:  None   Chief Complaint  Patient presents with  . Cardiac follow-up    History of Present Illness: [name] is an 86 y.o. female last assessed via telehealth encounter in August 2020.  She presents for a routine visit.  She does not describe any chest pain or palpitations, but states that she has been somewhat more short of breath with activities.  She does not report any obvious bleeding or changes in stool.  She follows with Dr. Rayann Heman in the device clinic, [place] pacemaker in place.  Recent device check indicated normal function.  I reviewed her lab work from November 2020 as outlined below.  Hemoglobin and renal function were normal at that time.  We went over her medications as well today.  Past Medical History:  Diagnosis Date  . Arthritis   . Atrial flutter ([place])    s/p RFA 2003  . Breast cancer ([place])    1980's - lumpectomy only  . Colon polyps   . Diverticulosis   . DVT, lower extremity ([place]) 1998 and 1999   Bilateral  . Essential hypertension   . GERD (gastroesophageal reflux disease)   . Glaucoma   . IBS (irritable bowel syndrome)   . Mixed hyperlipidemia   . Osteoporosis   . Persistent atrial fibrillation ([place])   . Pneumonia 2011  . Sick sinus syndrome ([facility])    PPM ([facility])  . Urinary tract infection   . Varicose veins     Past Surgical History:  Procedure Laterality Date  . BREAST LUMPECTOMY  1980's   Right breast   . CARDIAC CATHETERIZATION  1999   Normal coronaries  . Cataracts Bilateral   . CHOLECYSTECTOMY    . COLONOSCOPY W/ BIOPSIES AND POLYPECTOMY  03/2003, 05/2008, 02/20/2011   severe diverticulosis, tubulovillous adenoma polyp, internal and external hemorrhoids 2012: severe diverticulosis, 4 mm polyp, hemorrhoids  . CYST REMOVAL TRUNK     under  breast = on belly   . DILATION AND CURETTAGE OF UTERUS    . EYE SURGERY     cataracts - bilateral   . Fractured wrist Left 2013   repair  . INSERT / REPLACE / REMOVE PACEMAKER    . KNEE ARTHROSCOPY WITH MEDIAL MENISECTOMY Right 10/21/2014   Procedure: RIGHT KNEE ARTHROSCOPY WITH MEDIAL MENISECTOM,lateral menisectomy,synovectomy suprapatellar pouch;  Surgeon: Latanya Maudlin, MD;  [facility];  Service: Orthopedics;  Laterality: Right;  . PACEMAKER GENERATOR CHANGE  06/18/12   [facility] DR RF, Dr Rayann Heman  . PACEMAKER INSERTION  2003  . PERMANENT PACEMAKER GENERATOR CHANGE N/A 06/18/2012   Procedure: PERMANENT PACEMAKER GENERATOR CHANGE;  Surgeon: Thompson Grayer, MD;  Location[facility] CATH LAB;  Service: Cardiovascular;  Laterality: N/A;  . TOTAL KNEE ARTHROPLASTY  12/20/2011   Procedure: TOTAL KNEE ARTHROPLASTY;  Surgeon: Tobi Bastos, MD;  [facility];  Service: Orthopedics;  Laterality: Left;  . TOTAL KNEE ARTHROPLASTY Right 02/10/2015   Procedure: TOTAL RIGHT KNEE ARTHROPLASTY;  Surgeon: Latanya Maudlin, MD;  [facility];  Service: Orthopedics;  Laterality: Right;    Current Outpatient Medications  Medication Sig Dispense Refill  . acetaminophen (TYLENOL) 500 MG tablet Take 1,000 mg by mouth every 6 (six) hours as needed for mild pain.    Marland Kitchen alendronate (FOSAMAX) 70 MG tablet TAKE 1 TABLET  EVERY WEEK    . amiodarone (PACERONE) 200 MG tablet Take 1 tablet (200 mg total) by mouth daily. 15 tablet 6  . apixaban (ELIQUIS) 5 MG TABS tablet Take 1 tablet (5 mg total) by mouth 2 (two) times daily. 60 tablet 6  . calcium-vitamin D (OSCAL WITH D) 500-200 MG-UNIT tablet Take 1 tablet by mouth daily with breakfast.     . famotidine (PEPCID) 20 MG tablet TAKE ONE (1) TABLET EACH DAY 30 tablet 11  . latanoprost (XALATAN) 0.005 % ophthalmic solution Place 1 drop into both eyes at bedtime.    . Multiple Vitamin (MULTIVITAMIN) tablet Take 1 tablet by mouth every morning.     . nadolol (CORGARD) 40  MG tablet Take 1.5 tablets (60 mg total) by mouth daily. 135 tablet 0  . nitroGLYCERIN (NITROSTAT) 0.4 MG SL tablet Place 1 tablet (0.4 mg total) under the tongue every 5 (five) minutes as needed for chest pain. Up to 3 doses. If no relief after 3 rd dose, proceed to the ED 75 tablet 1   Current Facility-Administered Medications  Medication Dose Route Frequency Provider Last Rate Last Admin  . cyanocobalamin ((VITAMIN B-12)) injection 1,000 mcg  1,000 mcg Intramuscular Q30 days Dettinger, Fransisca Kaufmann, MD   1,000 mcg at 06/19/19 1018   Facility-Administered Medications Ordered in Other Visits  Medication Dose Route Frequency Provider Last Rate Last Admin  . bupivacaine liposome (EXPAREL) 1.3 % injection 266 mg  20 mL Infiltration Once [place], [employer], PA-C       Allergies:  Iodinated diagnostic agents and Naproxen   Social History: The patient  reports that she has never smoked. She has never used smokeless tobacco. She reports that she does not drink alcohol or use drugs.   ROS:  Please see the history of present illness. Otherwise, complete review of systems is positive for chronic arthritic stiffness, uses a cane, postnasal drip that is chronic.  All other systems are reviewed and negative.   Physical Exam: VS:  BP (!) 145/84   Pulse 62   Ht 5\' 4"  (1.626 m)   Wt 177 lb 6.4 oz (80.5 kg)   SpO2 99%   BMI 30.45 kg/m , BMI Body mass index is 30.45 kg/m.  Wt Readings from Last 3 Encounters:  07/04/19 177 lb 6.4 oz (80.5 kg)  03/26/19 182 lb (82.6 kg)  01/16/19 182 lb (82.6 kg)    General: Elderly woman, appears comfortable at rest. HEENT: Conjunctiva and lids normal, wearing a mask. Neck: Supple, no elevated JVP or carotid bruits, no thyromegaly. Lungs: Clear to auscultation, nonlabored breathing at rest. Cardiac: Regular rate and rhythm with ectopy, no S3, soft systolic murmur. Abdomen: Soft, nontender, bowel sounds present. Extremities: Mild edema, varicose veins, distal pulses  2+. Skin: Warm and dry. Musculoskeletal: No kyphosis. Neuropsychiatric: Alert and oriented x3, affect grossly appropriate.  ECG:  An ECG dated 12/03/2018 was personally reviewed today and demonstrated:  Dual-chamber paced rhythm with intermittent ventricular pacing.  Recent Labwork: 04/18/2019: ALT 13; AST 17; BUN 11; Creatinine, Ser 0.85; Hemoglobin 12.5; Platelets 186; Potassium 4.1; Sodium 141     Component Value Date/Time   CHOL 184 04/18/2019 1056   TRIG 61 04/18/2019 1056   HDL 61 04/18/2019 1056   CHOLHDL 3.0 04/18/2019 1056   LDLCALC 111 (H) 04/18/2019 1056    Other Studies Reviewed Today:  Lexiscan Myoview 08/01/2012: No perfusion evidence of scar or ischemia with LVEF 63%.  Assessment and Plan:  1.  Paroxysmal atrial fibrillation.  She does not report any palpitations, had approximately 22% rhythm burden by last device check.  She remains on Eliquis and amiodarone.  We will check a follow-up CBC to make sure that she is not anemic in light of dyspnea on exertion, but blood work from November 2020 looked good.  No changes in present medications.  2.  Sick sinus syndrome with [employer] pacemaker in place.  Keep follow-up with Dr. Rayann Heman.  3.  Essential hypertension, no changes in present regimen.  Keep follow-up with PCP.  Medication Adjustments/Labs and Tests Ordered: Current medicines are reviewed at length with the patient today.  Concerns regarding medicines are outlined above.   Tests Ordered: Orders Placed This Encounter  Procedures  . CBC    Medication Changes: No orders of the defined types were placed in this encounter.   Disposition:  Follow up 6 months in the [place] office.  Signed, Satira Sark, MD, [facility] 07/04/2019 11:07 AM    [facility] at [place], [place], [place] [address] Phone: [phone]; Fax: [phone]

## 2019-07-04 ENCOUNTER — Other Ambulatory Visit: Payer: Self-pay

## 2019-07-04 ENCOUNTER — Other Ambulatory Visit: Payer: Medicare Other

## 2019-07-04 ENCOUNTER — Other Ambulatory Visit: Payer: Self-pay | Admitting: *Deleted

## 2019-07-04 ENCOUNTER — Ambulatory Visit: Payer: Medicare Other | Admitting: Cardiology

## 2019-07-04 ENCOUNTER — Encounter: Payer: Self-pay | Admitting: Cardiology

## 2019-07-04 VITALS — BP 145/84 | HR 62 | Ht 64.0 in | Wt 177.4 lb

## 2019-07-04 DIAGNOSIS — I10 Essential (primary) hypertension: Secondary | ICD-10-CM | POA: Diagnosis not present

## 2019-07-04 DIAGNOSIS — I4819 Other persistent atrial fibrillation: Secondary | ICD-10-CM

## 2019-07-04 DIAGNOSIS — I495 Sick sinus syndrome: Secondary | ICD-10-CM

## 2019-07-04 NOTE — Patient Instructions (Addendum)
Medication Instructions:   Your physician recommends that you continue on your current medications as directed. Please refer to the Current Medication list given to you today.  Labwork:  Your physician recommends that you return for lab work TODAY to check your BMET. You may have this done at [facility]  Testing/Procedures:  NONE  Follow-Up:  Your physician recommends that you schedule a follow-up appointment in: 6 months (office). You will receive a reminder letter in the mail in about 4 months reminding you to call and schedule your appointment. If you don't receive this letter, please contact our office.  Any Other Special Instructions Will Be Listed Below (If Applicable).  If you need a refill on your cardiac medications before your next appointment, please call your pharmacy.

## 2019-07-08 ENCOUNTER — Telehealth: Payer: Self-pay | Admitting: *Deleted

## 2019-07-08 NOTE — Telephone Encounter (Signed)
-----   Message from Satira Sark, MD sent at 07/08/2019  9:57 AM EST ----- Results reviewed.  Let her know that hemoglobin remains normal.  Keep same follow-up plan.

## 2019-07-08 NOTE — Telephone Encounter (Signed)
Patient informed. 

## 2019-07-15 ENCOUNTER — Ambulatory Visit (INDEPENDENT_AMBULATORY_CARE_PROVIDER_SITE_OTHER): Payer: Medicare Other | Admitting: Family Medicine

## 2019-07-15 ENCOUNTER — Encounter: Payer: Self-pay | Admitting: Family Medicine

## 2019-07-15 DIAGNOSIS — E782 Mixed hyperlipidemia: Secondary | ICD-10-CM | POA: Diagnosis not present

## 2019-07-15 DIAGNOSIS — I10 Essential (primary) hypertension: Secondary | ICD-10-CM

## 2019-07-15 DIAGNOSIS — K219 Gastro-esophageal reflux disease without esophagitis: Secondary | ICD-10-CM

## 2019-07-15 DIAGNOSIS — M810 Age-related osteoporosis without current pathological fracture: Secondary | ICD-10-CM | POA: Diagnosis not present

## 2019-07-15 MED ORDER — CYANOCOBALAMIN 1000 MCG/ML IJ SOLN: 1000.0000 ug | INTRAMUSCULAR | 1 refills | Status: DC

## 2019-07-15 NOTE — Progress Notes (Signed)
Virtual Visit via telephone Note  I connected with [name] on 07/15/19 at 1002 by telephone and verified that I am speaking with the correct person using two identifiers. [name] B Freshour is currently located at home and no other people are currently with her during visit. The provider, Fransisca Kaufmann Lakin Rhine, MD is located in their office at time of visit.  Call ended at 1018  I discussed the limitations, risks, security and privacy concerns of performing an evaluation and management service by telephone and the availability of in person appointments. I also discussed with the patient that there may be a patient responsible charge related to this service. The patient expressed understanding and agreed to proceed.   History and Present Illness: Osteoporosis  Patient denies any issues with fosamax and she did have a wrist fracture last year. She is continuing to take it and t score of -1.6.   Hypertension Patient is currently on amiodarone and nadalol and eliquis, and their blood pressure today is unknown. Patient denies any lightheadedness or dizziness. Patient denies headaches, blurred vision, chest pains, shortness of breath, or weakness. Denies any side effects from medication and is content with current medication.   Hyperlipidemia Patient is coming in for recheck of his hyperlipidemia. The patient is currently taking no medication. They deny any issues with myalgias or history of liver damage from it. They deny any focal numbness or weakness or chest pain.   GERD Patient is currently on pepcid.  She denies any major symptoms or abdominal pain or belching or burping. She denies any blood in her stool or lightheadedness or dizziness.     Outpatient Encounter Medications as of 07/15/2019  Medication Sig  . acetaminophen (TYLENOL) 500 MG tablet Take 1,000 mg by mouth every 6 (six) hours as needed for mild pain.  Marland Kitchen alendronate (FOSAMAX) 70 MG tablet TAKE 1 TABLET EVERY WEEK    . amiodarone (PACERONE) 200 MG tablet Take 1 tablet (200 mg total) by mouth daily.  Marland Kitchen apixaban (ELIQUIS) 5 MG TABS tablet Take 1 tablet (5 mg total) by mouth 2 (two) times daily.  . calcium-vitamin D (OSCAL WITH D) 500-200 MG-UNIT tablet Take 1 tablet by mouth daily with breakfast.   . famotidine (PEPCID) 20 MG tablet TAKE ONE (1) TABLET EACH DAY  . latanoprost (XALATAN) 0.005 % ophthalmic solution Place 1 drop into both eyes at bedtime.  . Multiple Vitamin (MULTIVITAMIN) tablet Take 1 tablet by mouth every morning.   . nadolol (CORGARD) 40 MG tablet Take 1.5 tablets (60 mg total) by mouth daily.  . nitroGLYCERIN (NITROSTAT) 0.4 MG SL tablet Place 1 tablet (0.4 mg total) under the tongue every 5 (five) minutes as needed for chest pain. Up to 3 doses. If no relief after 3 rd dose, proceed to the ED   Facility-Administered Encounter Medications as of 07/15/2019  Medication  . bupivacaine liposome (EXPAREL) 1.3 % injection 266 mg  . cyanocobalamin ((VITAMIN B-12)) injection 1,000 mcg    Review of Systems  Constitutional: Negative for chills and fever.  Eyes: Negative for visual disturbance.  Respiratory: Negative for chest tightness and shortness of breath.   Cardiovascular: Negative for chest pain and leg swelling.  Musculoskeletal: Negative for back pain and gait problem.  Skin: Negative for rash.  Neurological: Negative for light-headedness and headaches.  Psychiatric/Behavioral: Negative for agitation and behavioral problems.  All other systems reviewed and are negative.   Observations/Objective: Patient sounds comfortable and in no acute distress  Assessment and  Plan: Problem List Items Addressed This Visit      Cardiovascular and Mediastinum   Essential hypertension, benign (Chronic)     Digestive   GERD (gastroesophageal reflux disease)     Musculoskeletal and Integument   Osteoporosis - Primary     Other   Hyperlipidemia (Chronic)      Continue current  medications Follow up plan: Return in about 3 months (around 10/12/2019), or if symptoms worsen or fail to improve, for htn and gerd and hld.     I discussed the assessment and treatment plan with the patient. The patient was provided an opportunity to ask questions and all were answered. The patient agreed with the plan and demonstrated an understanding of the instructions.   The patient was advised to call back or seek an in-person evaluation if the symptoms worsen or if the condition fails to improve as anticipated.  The above assessment and management plan was discussed with the patient. The patient verbalized understanding of and has agreed to the management plan. Patient is aware to call the clinic if symptoms persist or worsen. Patient is aware when to return to the clinic for a follow-up visit. Patient educated on when it is appropriate to go to the emergency department.    I provided 16 minutes of non-face-to-face time during this encounter.    Worthy Rancher, MD

## 2019-07-15 NOTE — Addendum Note (Signed)
Addended by: Caryl Pina on: 07/15/2019 10:23 AM   Modules accepted: Orders

## 2019-07-18 ENCOUNTER — Other Ambulatory Visit: Payer: Self-pay

## 2019-07-21 ENCOUNTER — Other Ambulatory Visit: Payer: Self-pay

## 2019-07-21 ENCOUNTER — Ambulatory Visit (INDEPENDENT_AMBULATORY_CARE_PROVIDER_SITE_OTHER): Payer: Medicare Other | Admitting: *Deleted

## 2019-07-21 DIAGNOSIS — E538 Deficiency of other specified B group vitamins: Secondary | ICD-10-CM | POA: Diagnosis not present

## 2019-07-30 ENCOUNTER — Telehealth: Payer: Self-pay | Admitting: Family Medicine

## 2019-07-30 NOTE — Chronic Care Management (AMB) (Signed)
  Chronic Care Management   Note  07/30/2019 Name: [name] MRN: [id number] DOB: 10/23/1928  [name] is a 86 y.o. year old female who is a primary care patient of Dettinger, Fransisca Kaufmann, MD. I reached out to [name] by phone today in response to a referral sent by Ms. [place] health plan.     Ms. Polo was given information about Chronic Care Management services today including:  1. CCM service includes personalized support from designated clinical staff supervised by her physician, including individualized plan of care and coordination with other care providers 2. 24/7 contact phone numbers for assistance for urgent and routine care needs. 3. Service will only be billed when office clinical staff spend 20 minutes or more in a month to coordinate care. 4. Only one practitioner may furnish and bill the service in a calendar month. 5. The patient may stop CCM services at any time (effective at the end of the month) by phone call to the office staff. 6. The patient will be responsible for cost sharing (co-pay) of up to 20% of the service fee (after annual deductible is met).  Patient agreed to services and verbal consent obtained.   Follow up plan: Telephone appointment with care management team member scheduled for:10/20/2019  Noreene Larsson, [address], [place], [place] [address] Direct Dial: [phone] [email or web address]: [email or web address]

## 2019-08-07 ENCOUNTER — Other Ambulatory Visit: Payer: Self-pay | Admitting: Family Medicine

## 2019-09-01 ENCOUNTER — Ambulatory Visit: Payer: Self-pay | Admitting: Family Medicine

## 2019-09-12 ENCOUNTER — Encounter: Payer: Self-pay | Admitting: Internal Medicine

## 2019-09-12 DIAGNOSIS — S199XXA Unspecified injury of neck, initial encounter: Secondary | ICD-10-CM | POA: Diagnosis not present

## 2019-09-12 DIAGNOSIS — Z7901 Long term (current) use of anticoagulants: Secondary | ICD-10-CM | POA: Diagnosis not present

## 2019-09-12 DIAGNOSIS — I4891 Unspecified atrial fibrillation: Secondary | ICD-10-CM | POA: Diagnosis not present

## 2019-09-12 DIAGNOSIS — Z96611 Presence of right artificial shoulder joint: Secondary | ICD-10-CM | POA: Diagnosis not present

## 2019-09-12 DIAGNOSIS — Z23 Encounter for immunization: Secondary | ICD-10-CM | POA: Diagnosis not present

## 2019-09-12 DIAGNOSIS — M25551 Pain in right hip: Secondary | ICD-10-CM | POA: Diagnosis not present

## 2019-09-12 DIAGNOSIS — S46911A Strain of unspecified muscle, fascia and tendon at shoulder and upper arm level, right arm, initial encounter: Secondary | ICD-10-CM | POA: Diagnosis not present

## 2019-09-12 DIAGNOSIS — S161XXA Strain of muscle, fascia and tendon at neck level, initial encounter: Secondary | ICD-10-CM | POA: Diagnosis not present

## 2019-09-12 DIAGNOSIS — S76011A Strain of muscle, fascia and tendon of right hip, initial encounter: Secondary | ICD-10-CM | POA: Diagnosis not present

## 2019-09-12 DIAGNOSIS — S3993XA Unspecified injury of pelvis, initial encounter: Secondary | ICD-10-CM | POA: Diagnosis not present

## 2019-09-12 DIAGNOSIS — M542 Cervicalgia: Secondary | ICD-10-CM | POA: Diagnosis not present

## 2019-09-12 DIAGNOSIS — R519 Headache, unspecified: Secondary | ICD-10-CM | POA: Diagnosis not present

## 2019-09-12 DIAGNOSIS — S0990XA Unspecified injury of head, initial encounter: Secondary | ICD-10-CM | POA: Diagnosis not present

## 2019-09-12 DIAGNOSIS — Z471 Aftercare following joint replacement surgery: Secondary | ICD-10-CM | POA: Diagnosis not present

## 2019-09-12 DIAGNOSIS — Z950 Presence of cardiac pacemaker: Secondary | ICD-10-CM | POA: Diagnosis not present

## 2019-09-23 ENCOUNTER — Other Ambulatory Visit: Payer: Self-pay

## 2019-09-23 ENCOUNTER — Ambulatory Visit (INDEPENDENT_AMBULATORY_CARE_PROVIDER_SITE_OTHER): Payer: Medicare Other | Admitting: *Deleted

## 2019-09-23 DIAGNOSIS — I4819 Other persistent atrial fibrillation: Secondary | ICD-10-CM | POA: Diagnosis not present

## 2019-09-23 DIAGNOSIS — E538 Deficiency of other specified B group vitamins: Secondary | ICD-10-CM | POA: Diagnosis not present

## 2019-09-23 LAB — CUP PACEART REMOTE DEVICE CHECK
Battery Remaining Longevity: 50 mo
Battery Remaining Percentage: 51 %
Battery Voltage: 2.87 V
Brady Statistic AP VP Percent: 85 %
Brady Statistic AP VS Percent: 1.5 %
Brady Statistic AS VP Percent: 13 %
Brady Statistic AS VS Percent: 1 %
Brady Statistic RA Percent Paced: 72 %
Brady Statistic RV Percent Paced: 98 %
Date Time Interrogation Session: 20210413033627
Implantable Lead Implant Date: 20031028
Implantable Lead Implant Date: 20031028
Implantable Lead Location: 753859
Implantable Lead Location: 753860
Implantable Pulse Generator Implant Date: 20140107
Lead Channel Impedance Value: 350 Ohm
Lead Channel Impedance Value: 440 Ohm
Lead Channel Pacing Threshold Amplitude: 0.75 V
Lead Channel Pacing Threshold Amplitude: 1 V
Lead Channel Pacing Threshold Pulse Width: 0.5 ms
Lead Channel Pacing Threshold Pulse Width: 0.5 ms
Lead Channel Sensing Intrinsic Amplitude: 0.2 mV
Lead Channel Sensing Intrinsic Amplitude: 9.5 mV
Lead Channel Setting Pacing Amplitude: 2 V
Lead Channel Setting Pacing Amplitude: 2.5 V
Lead Channel Setting Pacing Pulse Width: 0.5 ms
Lead Channel Setting Sensing Sensitivity: 2 mV
Pulse Gen Model: 2210
Pulse Gen Serial Number: 7438567

## 2019-09-24 NOTE — Progress Notes (Signed)
PPM Remote  

## 2019-09-26 ENCOUNTER — Other Ambulatory Visit: Payer: Self-pay | Admitting: Family Medicine

## 2019-10-16 DIAGNOSIS — M79671 Pain in right foot: Secondary | ICD-10-CM | POA: Diagnosis not present

## 2019-10-16 DIAGNOSIS — M25572 Pain in left ankle and joints of left foot: Secondary | ICD-10-CM | POA: Diagnosis not present

## 2019-10-17 ENCOUNTER — Encounter: Payer: Self-pay | Admitting: Internal Medicine

## 2019-10-17 ENCOUNTER — Other Ambulatory Visit: Payer: Self-pay

## 2019-10-17 ENCOUNTER — Ambulatory Visit: Payer: Medicare Other | Admitting: Internal Medicine

## 2019-10-17 VITALS — BP 130/80 | HR 56 | Ht 64.0 in | Wt 176.2 lb

## 2019-10-17 DIAGNOSIS — I10 Essential (primary) hypertension: Secondary | ICD-10-CM | POA: Diagnosis not present

## 2019-10-17 DIAGNOSIS — I495 Sick sinus syndrome: Secondary | ICD-10-CM | POA: Diagnosis not present

## 2019-10-17 DIAGNOSIS — Z79899 Other long term (current) drug therapy: Secondary | ICD-10-CM

## 2019-10-17 DIAGNOSIS — I480 Paroxysmal atrial fibrillation: Secondary | ICD-10-CM

## 2019-10-17 DIAGNOSIS — D6869 Other thrombophilia: Secondary | ICD-10-CM

## 2019-10-17 LAB — CUP PACEART INCLINIC DEVICE CHECK
Battery Remaining Longevity: 48 mo
Battery Voltage: 2.87 V
Brady Statistic RA Percent Paced: 73 %
Brady Statistic RV Percent Paced: 98 %
Date Time Interrogation Session: 20210507093932
Implantable Lead Implant Date: 20031028
Implantable Lead Implant Date: 20031028
Implantable Lead Location: 753859
Implantable Lead Location: 753860
Implantable Pulse Generator Implant Date: 20140107
Lead Channel Impedance Value: 350 Ohm
Lead Channel Impedance Value: 450 Ohm
Lead Channel Pacing Threshold Amplitude: 1 V
Lead Channel Pacing Threshold Amplitude: 1 V
Lead Channel Pacing Threshold Amplitude: 1.25 V
Lead Channel Pacing Threshold Amplitude: 1.25 V
Lead Channel Pacing Threshold Pulse Width: 0.5 ms
Lead Channel Pacing Threshold Pulse Width: 0.5 ms
Lead Channel Pacing Threshold Pulse Width: 0.5 ms
Lead Channel Pacing Threshold Pulse Width: 0.5 ms
Lead Channel Sensing Intrinsic Amplitude: 0.2 mV
Lead Channel Sensing Intrinsic Amplitude: 11.2 mV
Lead Channel Setting Pacing Amplitude: 2 V
Lead Channel Setting Pacing Amplitude: 2.5 V
Lead Channel Setting Pacing Pulse Width: 0.5 ms
Lead Channel Setting Sensing Sensitivity: 2 mV
Pulse Gen Model: 2210
Pulse Gen Serial Number: 7438567

## 2019-10-17 NOTE — Progress Notes (Signed)
PCP: Dettinger, Fransisca Kaufmann, MD Primary Cardiologist: Dr Domenic Polite Primary EP:  Dr Rayann Heman  [name] is a 86 y.o. female who presents today for routine electrophysiology followup.  Since last being seen in our clinic, the patient reports doing very well.  She has stable SOB with moderate activity and mild edema. Today, she denies symptoms of palpitations, chest pain  dizziness, presyncope, or syncope.  The patient is otherwise without complaint today.   Past Medical History:  Diagnosis Date  . Arthritis   . Atrial flutter ([place])    s/p RFA 2003  . Breast cancer ([place])    1980's - lumpectomy only  . Colon polyps   . Diverticulosis   . DVT, lower extremity ([place]) 1998 and 1999   Bilateral  . Essential hypertension   . GERD (gastroesophageal reflux disease)   . Glaucoma   . IBS (irritable bowel syndrome)   . Mixed hyperlipidemia   . Osteoporosis   . Persistent atrial fibrillation ([place])   . Pneumonia 2011  . Sick sinus syndrome ([facility])    PPM ([facility])  . Urinary tract infection   . Varicose veins    Past Surgical History:  Procedure Laterality Date  . BREAST LUMPECTOMY  1980's   Right breast   . CARDIAC CATHETERIZATION  1999   Normal coronaries  . Cataracts Bilateral   . CHOLECYSTECTOMY    . COLONOSCOPY W/ BIOPSIES AND POLYPECTOMY  03/2003, 05/2008, 02/20/2011   severe diverticulosis, tubulovillous adenoma polyp, internal and external hemorrhoids 2012: severe diverticulosis, 4 mm polyp, hemorrhoids  . CYST REMOVAL TRUNK     under breast = on belly   . DILATION AND CURETTAGE OF UTERUS    . EYE SURGERY     cataracts - bilateral   . Fractured wrist Left 2013   repair  . INSERT / REPLACE / REMOVE PACEMAKER    . KNEE ARTHROSCOPY WITH MEDIAL MENISECTOMY Right 10/21/2014   Procedure: RIGHT KNEE ARTHROSCOPY WITH MEDIAL MENISECTOM,lateral menisectomy,synovectomy suprapatellar pouch;  Surgeon: Latanya Maudlin, MD;  Location: [facility];  Service: Orthopedics;  Laterality: Right;  .  PACEMAKER GENERATOR CHANGE  06/18/12   [facility] DR RF, Dr Rayann Heman  . PACEMAKER INSERTION  2003  . PERMANENT PACEMAKER GENERATOR CHANGE N/A 06/18/2012   Procedure: PERMANENT PACEMAKER GENERATOR CHANGE;  Surgeon: Thompson Grayer, MD;  Location: [facility] CATH LAB;  Service: Cardiovascular;  Laterality: N/A;  . TOTAL KNEE ARTHROPLASTY  12/20/2011   Procedure: TOTAL KNEE ARTHROPLASTY;  Surgeon: Tobi Bastos, MD;  Location: [facility];  Service: Orthopedics;  Laterality: Left;  . TOTAL KNEE ARTHROPLASTY Right 02/10/2015   Procedure: TOTAL RIGHT KNEE ARTHROPLASTY;  Surgeon: Latanya Maudlin, MD;  Location: [facility];  Service: Orthopedics;  Laterality: Right;    ROS- all systems are reviewed and negative except as per HPI above  Current Outpatient Medications  Medication Sig Dispense Refill  . acetaminophen (TYLENOL) 500 MG tablet Take 1,000 mg by mouth every 6 (six) hours as needed for mild pain.    Marland Kitchen alendronate (FOSAMAX) 70 MG tablet TAKE 1 TABLET EVERY WEEK    . amiodarone (PACERONE) 200 MG tablet TAKE ONE (1) TABLET EACH DAY 90 tablet 0  . apixaban (ELIQUIS) 5 MG TABS tablet Take 1 tablet (5 mg total) by mouth 2 (two) times daily. 60 tablet 6  . calcium-vitamin D (OSCAL WITH D) 500-200 MG-UNIT tablet Take 1 tablet by mouth daily with breakfast.     . cyanocobalamin (,VITAMIN B-12,) 1000 MCG/ML injection Inject  1 mL (1,000 mcg total) into the muscle every 30 (thirty) days. 3 mL 1  . famotidine (PEPCID) 20 MG tablet TAKE ONE (1) TABLET EACH DAY 30 tablet 11  . latanoprost (XALATAN) 0.005 % ophthalmic solution Place 1 drop into both eyes at bedtime.    . Multiple Vitamin (MULTIVITAMIN) tablet Take 1 tablet by mouth every morning.     . nadolol (CORGARD) 40 MG tablet TAKE 1 AND 1/2 TABLETS DAILY 135 tablet 0  . nitroGLYCERIN (NITROSTAT) 0.4 MG SL tablet Place 1 tablet (0.4 mg total) under the tongue every 5 (five) minutes as needed for chest pain. Up to 3 doses. If no relief after 3 rd dose, proceed to the ED 75  tablet 1  . pantoprazole (PROTONIX) 40 MG tablet TAKE ONE (1) TABLET EACH DAY 90 tablet 0   Current Facility-Administered Medications  Medication Dose Route Frequency Provider Last Rate Last Admin  . cyanocobalamin ((VITAMIN B-12)) injection 1,000 mcg  1,000 mcg Intramuscular Q30 days Dettinger, Fransisca Kaufmann, MD   1,000 mcg at 09/23/19 1524   Facility-Administered Medications Ordered in Other Visits  Medication Dose Route Frequency Provider Last Rate Last Admin  . bupivacaine liposome (EXPAREL) 1.3 % injection 266 mg  20 mL Infiltration Once Ardeen Jourdain, PA-C        Physical Exam: Vitals:   10/17/19 0913  BP: 130/80  Pulse: (!) 56  SpO2: 93%  Weight: 176 lb 3.2 oz (79.9 kg)  Height: 5\' 4"  (1.626 m)    GEN- The patient is well appearing, alert and oriented x 3 today.   Head- normocephalic, atraumatic Eyes-  Sclera clear, conjunctiva pink Ears- hearing intact Oropharynx- clear Lungs-  normal work of breathing Chest- pacemaker pocket is well healed Heart- Regular rate and rhythm  GI- soft, NT, ND, + BS Extremities- no clubbing, cyanosis, + mild edema  Pacemaker interrogation- reviewed in detail today,  See PACEART report  ekg tracing ordered today is personally reviewed and shows AV paced rhythm  Assessment and Plan:  1. Symptomatic sinus bradycardia , second degree AV block  Normal pacemaker function See Pace Art report No changes today She is 98% V paced she is device dependant today  2. Persistent afib She is on amiodarone afib burden is 23 % since 2018, no significant afib in the past 12 months The importance of close monitoring to avoid toxicity with amiodarone was discussed today labs 11/20 reviewed Will order LFTs, TSH today (she wishes to have PCP draw these next week) She is on eliquis  3. HTN Stable No change required today  Follow-up with Dr Domenic Polite as scheduled I will see in a year  Thompson Grayer MD, [facility] 10/17/2019 9:50 AM

## 2019-10-17 NOTE — Patient Instructions (Signed)
Medication Instructions:  Continue all current medications.  Labwork:  Liver profile, TSH, T4 - orders given today.  Can do at pcp office.   Office will contact with results via phone or letter.    Testing/Procedures: none  Follow-Up: Your physician wants you to follow up in:  1 year.  You will receive a reminder letter in the mail one-two months in advance.  If you don't receive a letter, please call our office to schedule the follow up appointment   Any Other Special Instructions Will Be Listed Below (If Applicable).  If you need a refill on your cardiac medications before your next appointment, please call your pharmacy.

## 2019-10-20 ENCOUNTER — Ambulatory Visit (INDEPENDENT_AMBULATORY_CARE_PROVIDER_SITE_OTHER): Payer: Medicare Other | Admitting: *Deleted

## 2019-10-20 DIAGNOSIS — I4891 Unspecified atrial fibrillation: Secondary | ICD-10-CM | POA: Diagnosis not present

## 2019-10-20 DIAGNOSIS — E782 Mixed hyperlipidemia: Secondary | ICD-10-CM

## 2019-10-20 DIAGNOSIS — I10 Essential (primary) hypertension: Secondary | ICD-10-CM

## 2019-10-20 DIAGNOSIS — M810 Age-related osteoporosis without current pathological fracture: Secondary | ICD-10-CM

## 2019-10-20 NOTE — Patient Instructions (Signed)
Visit Information  Goals Addressed            This Visit's Progress   . Chronic Disease Management Needs       CARE PLAN ENTRY (see longtitudinal plan of care for additional care plan information)  Current Barriers:  . Chronic Disease Management support, education, and care coordination needs related to HTN, Afib, Osteoporosis, diverticulosis, HLD, glaucoma  Clinical Goal(s) related to HTN, Afib, Osteoporosis, diverticulosis, HLD, glaucoma:  Over the next 45 days, patient will:  . Work with the care management team to address educational, disease management, and care coordination needs  . Call provider office for new or worsened signs and symptoms  . Call care management team with questions or concerns . Verbalize basic understanding of patient centered plan of care established today  Interventions related to HTN, Afib, Osteoporosis, diverticulosis, HLD, glaucoma:  . Evaluation of current treatment plans and patient's adherence to plan as established by provider . Assessed patient understanding of disease states . Assessed patient's education and care coordination needs . Provided disease specific education to patient  . Collaborated with appropriate clinical care team members regarding patient needs . Provided with CCM contact information and encouraged to reach out as needed  Patient Self Care Activities related to HTN, Afib, Osteoporosis, diverticulosis, HLD, glaucoma:  . Patient is unable to independently self-manage chronic health conditions  Initial goal documentation        The patient verbalized understanding of instructions provided today and declined a print copy of patient instruction materials.   Follow-up Plan The care management team will reach out to the patient again over the next 45 days.   [name], BSN, RN-BC Embedded Chronic Care Manager [facility] / [place] Management Direct Dial: [phone]

## 2019-10-20 NOTE — Chronic Care Management (AMB) (Signed)
Chronic Care Management   Initial Visit Note  10/20/2019 Name: [name] MRN: [id number] DOB: 02/10/1929  Referred by: Dettinger, Fransisca Kaufmann, MD Reason for referral : Chronic Care Management   [name] is a 86 y.o. year old female who is a primary care patient of Dettinger, Fransisca Kaufmann, MD. The CCM team was consulted for assistance with chronic disease management and care coordination needs related to HTN, Afib, Osteoporosis, diverticulosis, HLD, glaucoma  Review of patient status, including review of consultants reports, relevant laboratory and other test results, and collaboration with appropriate care team members and the patient's provider was performed as part of comprehensive patient evaluation and provision of chronic care management services.    Subjective: I spoke with [name] today regarding management of her chronic medical conditions.   SDOH (Social Determinants of Health) assessments performed: Yes See Care Plan activities for detailed interventions related to SDOH    Objective: Outpatient Encounter Medications as of 10/20/2019  Medication Sig  . acetaminophen (TYLENOL) 500 MG tablet Take 1,000 mg by mouth every 6 (six) hours as needed for mild pain.  Marland Kitchen alendronate (FOSAMAX) 70 MG tablet TAKE 1 TABLET EVERY WEEK  . amiodarone (PACERONE) 200 MG tablet TAKE ONE (1) TABLET EACH DAY  . apixaban (ELIQUIS) 5 MG TABS tablet Take 1 tablet (5 mg total) by mouth 2 (two) times daily.  . calcium-vitamin D (OSCAL WITH D) 500-200 MG-UNIT tablet Take 1 tablet by mouth daily with breakfast.   . cyanocobalamin (,VITAMIN B-12,) 1000 MCG/ML injection Inject 1 mL (1,000 mcg total) into the muscle every 30 (thirty) days.  . famotidine (PEPCID) 20 MG tablet TAKE ONE (1) TABLET EACH DAY  . latanoprost (XALATAN) 0.005 % ophthalmic solution Place 1 drop into both eyes at bedtime.  . Multiple Vitamin (MULTIVITAMIN) tablet Take 1 tablet by mouth every morning.   . nadolol  (CORGARD) 40 MG tablet TAKE 1 AND 1/2 TABLETS DAILY  . nitroGLYCERIN (NITROSTAT) 0.4 MG SL tablet Place 1 tablet (0.4 mg total) under the tongue every 5 (five) minutes as needed for chest pain. Up to 3 doses. If no relief after 3 rd dose, proceed to the ED  . pantoprazole (PROTONIX) 40 MG tablet TAKE ONE (1) TABLET EACH DAY   Facility-Administered Encounter Medications as of 10/20/2019  Medication  . bupivacaine liposome (EXPAREL) 1.3 % injection 266 mg  . cyanocobalamin ((VITAMIN B-12)) injection 1,000 mcg     RN Care Plan   . Chronic Disease Management Needs       CARE PLAN ENTRY (see longtitudinal plan of care for additional care plan information)  Current Barriers:  . Chronic Disease Management support, education, and care coordination needs related to HTN, Afib, Osteoporosis, diverticulosis, HLD, glaucoma  Clinical Goal(s) related to HTN, Afib, Osteoporosis, diverticulosis, HLD, glaucoma:  Over the next 45 days, patient will:  . Work with the care management team to address educational, disease management, and care coordination needs  . Call provider office for new or worsened signs and symptoms  . Call care management team with questions or concerns . Verbalize basic understanding of patient centered plan of care established today  Interventions related to HTN, Afib, Osteoporosis, diverticulosis, HLD, glaucoma:  . Evaluation of current treatment plans and patient's adherence to plan as established by provider . Assessed patient understanding of disease states . Assessed patient's education and care coordination needs . Provided disease specific education to patient  . Collaborated with appropriate clinical care team members regarding patient needs .  Provided with CCM contact information and encouraged to reach out as needed  Patient Self Care Activities related to HTN, Afib, Osteoporosis, diverticulosis, HLD, glaucoma:  . Patient is unable to independently self-manage chronic  health conditions  Initial goal documentation        Follow-up Plan:   The care management team will reach out to the patient again over the next 45 days.   [name], BSN, RN-BC Embedded Chronic Care Manager [facility] / [place] Management Direct Dial: [phone]

## 2019-10-24 ENCOUNTER — Ambulatory Visit (INDEPENDENT_AMBULATORY_CARE_PROVIDER_SITE_OTHER): Payer: Medicare Other | Admitting: *Deleted

## 2019-10-24 ENCOUNTER — Other Ambulatory Visit: Payer: Self-pay | Admitting: Family Medicine

## 2019-10-24 ENCOUNTER — Other Ambulatory Visit: Payer: Self-pay

## 2019-10-24 DIAGNOSIS — E538 Deficiency of other specified B group vitamins: Secondary | ICD-10-CM

## 2019-10-24 NOTE — Progress Notes (Signed)
Patient in today for B 12 injection. 1000 mcg given IM in right deltoid. Patient tolerated well.  

## 2019-10-27 ENCOUNTER — Other Ambulatory Visit: Payer: Self-pay

## 2019-10-27 ENCOUNTER — Other Ambulatory Visit: Payer: Medicare Other

## 2019-10-27 DIAGNOSIS — I480 Paroxysmal atrial fibrillation: Secondary | ICD-10-CM | POA: Diagnosis not present

## 2019-10-27 DIAGNOSIS — Z79899 Other long term (current) drug therapy: Secondary | ICD-10-CM | POA: Diagnosis not present

## 2019-10-28 DIAGNOSIS — H2513 Age-related nuclear cataract, bilateral: Secondary | ICD-10-CM | POA: Diagnosis not present

## 2019-10-28 DIAGNOSIS — H40033 Anatomical narrow angle, bilateral: Secondary | ICD-10-CM | POA: Diagnosis not present

## 2019-11-21 ENCOUNTER — Ambulatory Visit (INDEPENDENT_AMBULATORY_CARE_PROVIDER_SITE_OTHER): Payer: Medicare Other | Admitting: *Deleted

## 2019-11-21 DIAGNOSIS — I480 Paroxysmal atrial fibrillation: Secondary | ICD-10-CM

## 2019-11-21 DIAGNOSIS — E782 Mixed hyperlipidemia: Secondary | ICD-10-CM | POA: Diagnosis not present

## 2019-11-21 DIAGNOSIS — I10 Essential (primary) hypertension: Secondary | ICD-10-CM | POA: Diagnosis not present

## 2019-11-25 ENCOUNTER — Ambulatory Visit (INDEPENDENT_AMBULATORY_CARE_PROVIDER_SITE_OTHER): Payer: Medicare Other | Admitting: *Deleted

## 2019-11-25 ENCOUNTER — Other Ambulatory Visit: Payer: Self-pay

## 2019-11-25 DIAGNOSIS — E538 Deficiency of other specified B group vitamins: Secondary | ICD-10-CM | POA: Diagnosis not present

## 2019-11-25 MED ORDER — CYANOCOBALAMIN 1000 MCG/ML IJ SOLN
1000.0000 ug | INTRAMUSCULAR | Status: AC
  Administered 2019-11-25 – 2020-10-06 (×8): 1000 ug via INTRAMUSCULAR

## 2019-11-25 NOTE — Patient Instructions (Signed)

## 2019-11-25 NOTE — Progress Notes (Signed)
Pt given B12 injection IM left deltoid and tolerated well. °

## 2019-12-08 NOTE — Chronic Care Management (AMB) (Signed)
Chronic Care Management   Follow Up Note   11/21/2019 Name: [name] MRN: [id number] DOB: August 20, 1928  Referred by: Dettinger, Fransisca Kaufmann, MD Reason for referral : Chronic Care Management   [name] is a 86 y.o. year old female who is a primary care patient of Dettinger, Fransisca Kaufmann, MD. The CCM team was consulted for assistance with chronic disease management and care coordination needs.    Review of patient status, including review of consultants reports, relevant laboratory and other test results, and collaboration with appropriate care team members and the patient's provider was performed as part of comprehensive patient evaluation and provision of chronic care management services.    SDOH (Social Determinants of Health) assessments performed: No See Care Plan activities for detailed interventions related to [facility])     Outpatient Encounter Medications as of 11/21/2019  Medication Sig  . acetaminophen (TYLENOL) 500 MG tablet Take 1,000 mg by mouth every 6 (six) hours as needed for mild pain.  Marland Kitchen alendronate (FOSAMAX) 70 MG tablet TAKE 1 TABLET EVERY WEEK  . amiodarone (PACERONE) 200 MG tablet TAKE ONE (1) TABLET EACH DAY  . apixaban (ELIQUIS) 5 MG TABS tablet Take 1 tablet (5 mg total) by mouth 2 (two) times daily.  . calcium-vitamin D (OSCAL WITH D) 500-200 MG-UNIT tablet Take 1 tablet by mouth daily with breakfast.   . cyanocobalamin (,VITAMIN B-12,) 1000 MCG/ML injection Inject 1 mL (1,000 mcg total) into the muscle every 30 (thirty) days.  . famotidine (PEPCID) 20 MG tablet TAKE ONE (1) TABLET EACH DAY  . latanoprost (XALATAN) 0.005 % ophthalmic solution Place 1 drop into both eyes at bedtime.  . Multiple Vitamin (MULTIVITAMIN) tablet Take 1 tablet by mouth every morning.   . nadolol (CORGARD) 40 MG tablet TAKE 1 AND 1/2 TABLETS DAILY  . nitroGLYCERIN (NITROSTAT) 0.4 MG SL tablet Place 1 tablet (0.4 mg total) under the tongue every 5 (five) minutes as needed for  chest pain. Up to 3 doses. If no relief after 3 rd dose, proceed to the ED  . pantoprazole (PROTONIX) 40 MG tablet TAKE ONE (1) TABLET EACH DAY   Facility-Administered Encounter Medications as of 11/21/2019  Medication  . bupivacaine liposome (EXPAREL) 1.3 % injection 266 mg      RN Care Plan   . Chronic Disease Management Needs       CARE PLAN ENTRY (see longtitudinal plan of care for additional care plan information)  Current Barriers:  . Chronic Disease Management support, education, and care coordination needs related to HTN, Afib, Osteoporosis, diverticulosis, HLD, glaucoma  Clinical Goal(s) related to HTN, Afib, Osteoporosis, diverticulosis, HLD, glaucoma:  Over the next 45 days, patient will:  . Work with the care management team to address educational, disease management, and care coordination needs  . Call provider office for new or worsened signs and symptoms  . Call care management team with questions or concerns  Interventions related to HTN, Afib, Osteoporosis, diverticulosis, HLD, glaucoma:  . Evaluation of current treatment plans and patient's adherence to plan as established by provider . Assessed patient's education and care coordination needs . Collaborated with appropriate clinical care team members regarding patient needs . Chart reviewed including recent office notes and lab results . Upcoming appointments reviewed . Provided with CCM contact information and encouraged to reach out as needed  Patient Self Care Activities related to HTN, Afib, Osteoporosis, diverticulosis, HLD, glaucoma:  . Patient is unable to independently self-manage chronic health conditions  Please see past  updates related to this goal by clicking on the "Past Updates" button in the selected goal          Plan:   The care management team will reach out to the patient again over the next 60 days.  The patient has been provided with contact information for the care management team and  has been advised to call with any health related questions or concerns.   [name], BSN, RN-BC Embedded Chronic Care Manager [facility] / [place] Management Direct Dial: [phone]

## 2019-12-08 NOTE — Patient Instructions (Signed)
Visit Information  Goals Addressed            This Visit's Progress   . Chronic Disease Management Needs       CARE PLAN ENTRY (see longtitudinal plan of care for additional care plan information)  Current Barriers:  . Chronic Disease Management support, education, and care coordination needs related to HTN, Afib, Osteoporosis, diverticulosis, HLD, glaucoma  Clinical Goal(s) related to HTN, Afib, Osteoporosis, diverticulosis, HLD, glaucoma:  Over the next 45 days, patient will:  . Work with the care management team to address educational, disease management, and care coordination needs  . Call provider office for new or worsened signs and symptoms  . Call care management team with questions or concerns  Interventions related to HTN, Afib, Osteoporosis, diverticulosis, HLD, glaucoma:  . Evaluation of current treatment plans and patient's adherence to plan as established by provider . Assessed patient's education and care coordination needs . Collaborated with appropriate clinical care team members regarding patient needs . Chart reviewed including recent office notes and lab results . Upcoming appointments reviewed . Provided with CCM contact information and encouraged to reach out as needed  Patient Self Care Activities related to HTN, Afib, Osteoporosis, diverticulosis, HLD, glaucoma:  . Patient is unable to independently self-manage chronic health conditions  Please see past updates related to this goal by clicking on the "Past Updates" button in the selected goal         Patient verbalizes understanding of instructions provided today.   Follow-up Plan The care management team will reach out to the patient again over the next 60 days.  The patient has been provided with contact information for the care management team and has been advised to call with any health related questions or concerns.   [name], BSN, RN-BC Embedded Chronic Care Manager [facility] [place] [facility] Direct Dial: [phone]

## 2019-12-19 ENCOUNTER — Other Ambulatory Visit: Payer: Self-pay | Admitting: Family Medicine

## 2019-12-19 NOTE — Telephone Encounter (Signed)
Last OV 07/15/2019. Last RF 30 day supply given today. Next OV not scheduled  Was supposed to come back around 10/12/19-ntbs for further refills

## 2019-12-23 ENCOUNTER — Ambulatory Visit (INDEPENDENT_AMBULATORY_CARE_PROVIDER_SITE_OTHER): Payer: Medicare Other | Admitting: *Deleted

## 2019-12-23 DIAGNOSIS — I495 Sick sinus syndrome: Secondary | ICD-10-CM

## 2019-12-23 LAB — CUP PACEART REMOTE DEVICE CHECK
Battery Remaining Longevity: 44 mo
Battery Remaining Percentage: 46 %
Battery Voltage: 2.86 V
Brady Statistic AP VP Percent: 90 %
Brady Statistic AP VS Percent: 1 %
Brady Statistic AS VP Percent: 8.2 %
Brady Statistic AS VS Percent: 1 %
Brady Statistic RA Percent Paced: 90 %
Brady Statistic RV Percent Paced: 98 %
Date Time Interrogation Session: 20210713020759
Implantable Lead Implant Date: 20031028
Implantable Lead Implant Date: 20031028
Implantable Lead Location: 753859
Implantable Lead Location: 753860
Implantable Pulse Generator Implant Date: 20140107
Lead Channel Impedance Value: 360 Ohm
Lead Channel Impedance Value: 440 Ohm
Lead Channel Pacing Threshold Amplitude: 1 V
Lead Channel Pacing Threshold Amplitude: 1.25 V
Lead Channel Pacing Threshold Pulse Width: 0.5 ms
Lead Channel Pacing Threshold Pulse Width: 0.5 ms
Lead Channel Sensing Intrinsic Amplitude: 0.2 mV
Lead Channel Sensing Intrinsic Amplitude: 9.7 mV
Lead Channel Setting Pacing Amplitude: 2 V
Lead Channel Setting Pacing Amplitude: 2.5 V
Lead Channel Setting Pacing Pulse Width: 0.5 ms
Lead Channel Setting Sensing Sensitivity: 2 mV
Pulse Gen Model: 2210
Pulse Gen Serial Number: 7438567

## 2019-12-23 NOTE — Progress Notes (Signed)
Remote pacemaker transmission.   

## 2019-12-26 ENCOUNTER — Encounter: Payer: Self-pay | Admitting: Family Medicine

## 2019-12-26 ENCOUNTER — Other Ambulatory Visit: Payer: Self-pay

## 2019-12-26 ENCOUNTER — Ambulatory Visit (INDEPENDENT_AMBULATORY_CARE_PROVIDER_SITE_OTHER): Payer: Medicare Other

## 2019-12-26 ENCOUNTER — Ambulatory Visit (INDEPENDENT_AMBULATORY_CARE_PROVIDER_SITE_OTHER): Payer: Medicare Other | Admitting: Family Medicine

## 2019-12-26 VITALS — BP 127/75 | HR 62 | Temp 98.0°F | Ht 64.0 in | Wt 172.0 lb

## 2019-12-26 DIAGNOSIS — E782 Mixed hyperlipidemia: Secondary | ICD-10-CM

## 2019-12-26 DIAGNOSIS — I10 Essential (primary) hypertension: Secondary | ICD-10-CM

## 2019-12-26 DIAGNOSIS — I480 Paroxysmal atrial fibrillation: Secondary | ICD-10-CM

## 2019-12-26 DIAGNOSIS — Z7901 Long term (current) use of anticoagulants: Secondary | ICD-10-CM

## 2019-12-26 DIAGNOSIS — E538 Deficiency of other specified B group vitamins: Secondary | ICD-10-CM | POA: Diagnosis not present

## 2019-12-26 DIAGNOSIS — K219 Gastro-esophageal reflux disease without esophagitis: Secondary | ICD-10-CM

## 2019-12-26 MED ORDER — AMIODARONE HCL 200 MG PO TABS: ORAL_TABLET | ORAL | 3 refills | Status: DC

## 2019-12-26 MED ORDER — PANTOPRAZOLE SODIUM 40 MG PO TBEC: 40.0000 mg | DELAYED_RELEASE_TABLET | Freq: Every day | ORAL | 3 refills | Status: DC

## 2019-12-26 MED ORDER — NADOLOL 40 MG PO TABS: 60.0000 mg | ORAL_TABLET | Freq: Every day | ORAL | 3 refills | Status: DC

## 2019-12-26 NOTE — Progress Notes (Signed)
BP 127/75    Pulse 62    Temp 98 F (36.7 C)    Ht '5\' 4"'$  (1.626 m)    Wt 172 lb (78 kg)    SpO2 98%    BMI 29.52 kg/m    Subjective:   Patient ID: [name], female    DOB: 12-30-1928, 86 y.o.   MRN: [id number]  HPI: [name] is a 86 y.o. female presenting on 12/26/2019 for Medical Management of Chronic Issues and Hypertension   HPI Hypertension Patient is currently on amiodarone and nadolol, and their blood pressure today is 127/75. Patient denies any lightheadedness or dizziness. Patient denies headaches, blurred vision, chest pains, shortness of breath, or weakness. Denies any side effects from medication and is content with current medication.   Hyperlipidemia Patient is coming in for recheck of his hyperlipidemia. The patient is currently taking no medication currently monitoring. They deny any issues with myalgias or history of liver damage from it. They deny any focal numbness or weakness or chest pain.   GERD Patient is currently on pantoprazole.  She denies any major symptoms or abdominal pain or belching or burping. She denies any blood in her stool or lightheadedness or dizziness.   Chronic paroxysmal A. fib and chronic anticoagulation Reason on anticoagulation: A. fib, currently on Eliquis Patient denies any bruising or bleeding or chest pain or palpitations   Relevant past medical, surgical, family and social history reviewed and updated as indicated. Interim medical history since our last visit reviewed. Allergies and medications reviewed and updated.  Review of Systems  Constitutional: Negative for chills and fever.  Eyes: Negative for visual disturbance.  Respiratory: Negative for chest tightness and shortness of breath.   Cardiovascular: Negative for chest pain and leg swelling.  Musculoskeletal: Negative for back pain and gait problem.  Skin: Negative for rash.  Neurological: Negative for dizziness, light-headedness and headaches.    Psychiatric/Behavioral: Negative for agitation and behavioral problems.  All other systems reviewed and are negative.   Per HPI unless specifically indicated above   Allergies as of 12/26/2019      Reactions   Iodinated Diagnostic Agents Hives, Itching, Rash   03/13/14: symptoms began within two hours of intrathecal injection (myelogram 03/11/14); on shoulders radiating down to stomach.  Relief with Benadryl.  Told patient she would need to pre-med with Benadryl in the future before receiving this contrast.  Brita Romp, RN   Naproxen Hives, Itching, Rash      Medication List       Accurate as of December 26, 2019  3:15 PM. If you have any questions, ask your nurse or doctor.        acetaminophen 500 MG tablet Commonly known as: TYLENOL Take 1,000 mg by mouth every 6 (six) hours as needed for mild pain.   alendronate 70 MG tablet Commonly known as: FOSAMAX TAKE 1 TABLET EVERY WEEK   amiodarone 200 MG tablet Commonly known as: PACERONE TAKE ONE (1) TABLET EACH DAY   calcium-vitamin D 500-200 MG-UNIT tablet Commonly known as: OSCAL WITH D Take 1 tablet by mouth daily with breakfast.   cyanocobalamin 1000 MCG/ML injection Commonly known as: (VITAMIN B-12) Inject 1 mL (1,000 mcg total) into the muscle every 30 (thirty) days.   Eliquis 5 MG Tabs tablet Generic drug: apixaban TAKE ONE TABLET BY MOUTH TWICE DAILY   famotidine 20 MG tablet Commonly known as: PEPCID TAKE ONE (1) TABLET EACH DAY   latanoprost 0.005 % ophthalmic solution  Commonly known as: XALATAN Place 1 drop into both eyes at bedtime.   multivitamin tablet Take 1 tablet by mouth every morning.   nadolol 40 MG tablet Commonly known as: CORGARD Take 1.5 tablets (60 mg total) by mouth daily. What changed: additional instructions Changed by: Fransisca Kaufmann Baine Decesare, MD   nitroGLYCERIN 0.4 MG SL tablet Commonly known as: NITROSTAT Place 1 tablet (0.4 mg total) under the tongue every 5 (five) minutes as needed  for chest pain. Up to 3 doses. If no relief after 3 rd dose, proceed to the ED   pantoprazole 40 MG tablet Commonly known as: PROTONIX Take 1 tablet (40 mg total) by mouth daily. What changed: additional instructions Changed by: Fransisca Kaufmann Aleyna Cueva, MD        Objective:   BP 127/75    Pulse 62    Temp 98 F (36.7 C)    Ht '5\' 4"'$  (1.626 m)    Wt 172 lb (78 kg)    SpO2 98%    BMI 29.52 kg/m   Wt Readings from Last 3 Encounters:  12/26/19 172 lb (78 kg)  10/17/19 176 lb 3.2 oz (79.9 kg)  07/04/19 177 lb 6.4 oz (80.5 kg)    Physical Exam Vitals and nursing note reviewed.  Constitutional:      General: She is not in acute distress.    Appearance: She is well-developed. She is not diaphoretic.  Eyes:     Conjunctiva/sclera: Conjunctivae normal.  Cardiovascular:     Rate and Rhythm: Normal rate. Rhythm irregular.     Heart sounds: Normal heart sounds. No murmur heard.   Pulmonary:     Effort: Pulmonary effort is normal. No respiratory distress.     Breath sounds: Normal breath sounds. No wheezing.  Musculoskeletal:        General: No tenderness. Normal range of motion.     Comments: Patient does have a slowed gait and decreased balance and using a cane  Skin:    General: Skin is warm and dry.     Findings: No rash.  Neurological:     Mental Status: She is alert and oriented to person, place, and time.     Coordination: Coordination normal.  Psychiatric:        Behavior: Behavior normal.       Assessment & Plan:   Problem List Items Addressed This Visit      Cardiovascular and Mediastinum   Essential hypertension, benign - Primary (Chronic)   Relevant Medications   nadolol (CORGARD) 40 MG tablet   amiodarone (PACERONE) 200 MG tablet   Other Relevant Orders   CMP14+EGFR   CBC with Differential/Platelet   Paroxysmal atrial fibrillation (HCC)   Relevant Medications   nadolol (CORGARD) 40 MG tablet   amiodarone (PACERONE) 200 MG tablet   Other Relevant Orders    TSH     Digestive   GERD (gastroesophageal reflux disease)   Relevant Medications   pantoprazole (PROTONIX) 40 MG tablet     Other   Hyperlipidemia (Chronic)   Relevant Medications   nadolol (CORGARD) 40 MG tablet   amiodarone (PACERONE) 200 MG tablet   Other Relevant Orders   Lipid panel   Chronic anticoagulation   Relevant Orders   CBC with Differential/Platelet      Patient see her cardiologist for her heart, they said everything is looking good there, likely her shortness of breath is due to her diminished stamina from sitting around this past year and instructed her to  join a program such as water aerobics which would be very beneficial for her Follow up plan: Return if symptoms worsen or fail to improve, for 4 to 6 months recheck of hyperlipidemia and anticoagulation.  Counseling provided for all of the vaccine components Orders Placed This Encounter  Procedures   CMP14+EGFR   Lipid panel   CBC with Differential/Platelet   TSH    Caryl Pina, MD [facility] 12/26/2019, 3:15 PM

## 2019-12-26 NOTE — Progress Notes (Signed)
Cyanocobalamin injection given to right deltoid.  Patient tolerated well. 

## 2019-12-27 LAB — CMP14+EGFR
ALT: 14 IU/L (ref 0–32)
AST: 21 IU/L (ref 0–40)
Albumin/Globulin Ratio: 1.8 (ref 1.2–2.2)
Albumin: 4.1 g/dL (ref 3.6–4.6)
Alkaline Phosphatase: 61 IU/L (ref 48–121)
BUN/Creatinine Ratio: 10 — ABNORMAL LOW (ref 12–28)
BUN: 9 mg/dL (ref 8–27)
Bilirubin Total: 0.5 mg/dL (ref 0.0–1.2)
CO2: 23 mmol/L (ref 20–29)
Calcium: 9.1 mg/dL (ref 8.7–10.3)
Chloride: 104 mmol/L (ref 96–106)
Creatinine, Ser: 0.87 mg/dL (ref 0.57–1.00)
GFR calc Af Amer: 69 mL/min/{1.73_m2} (ref >59)
GFR calc non Af Amer: 60 mL/min/{1.73_m2} (ref >59)
Globulin, Total: 2.3 g/dL (ref 1.5–4.5)
Glucose: 101 mg/dL — ABNORMAL HIGH (ref 65–99)
Potassium: 4.2 mmol/L (ref 3.5–5.2)
Sodium: 140 mmol/L (ref 134–144)
Total Protein: 6.4 g/dL (ref 6.0–8.5)

## 2019-12-27 LAB — CBC WITH DIFFERENTIAL/PLATELET
Basophils Absolute: 0 10*3/uL (ref 0.0–0.2)
Basos: 1 %
EOS (ABSOLUTE): 0.1 10*3/uL (ref 0.0–0.4)
Eos: 3 %
Hematocrit: 34.6 % (ref 34.0–46.6)
Hemoglobin: 11.6 g/dL (ref 11.1–15.9)
Immature Grans (Abs): 0 10*3/uL (ref 0.0–0.1)
Immature Granulocytes: 0 %
Lymphocytes Absolute: 1.6 10*3/uL (ref 0.7–3.1)
Lymphs: 37 %
MCH: 33 pg (ref 26.6–33.0)
MCHC: 33.5 g/dL (ref 31.5–35.7)
MCV: 99 fL — ABNORMAL HIGH (ref 79–97)
Monocytes Absolute: 0.5 10*3/uL (ref 0.1–0.9)
Monocytes: 12 %
Neutrophils Absolute: 2 10*3/uL (ref 1.4–7.0)
Neutrophils: 47 %
Platelets: 144 10*3/uL — ABNORMAL LOW (ref 150–450)
RBC: 3.51 x10E6/uL — ABNORMAL LOW (ref 3.77–5.28)
RDW: 13.2 % (ref 11.7–15.4)
WBC: 4.3 10*3/uL (ref 3.4–10.8)

## 2019-12-27 LAB — TSH: TSH: 1.15 u[IU]/mL (ref 0.450–4.500)

## 2019-12-27 LAB — LIPID PANEL
Chol/HDL Ratio: 3.5 ratio (ref 0.0–4.4)
Cholesterol, Total: 171 mg/dL (ref 100–199)
HDL: 49 mg/dL (ref >39)
LDL Chol Calc (NIH): 105 mg/dL — ABNORMAL HIGH (ref 0–99)
Triglycerides: 89 mg/dL (ref 0–149)
VLDL Cholesterol Cal: 17 mg/dL (ref 5–40)

## 2020-01-02 ENCOUNTER — Telehealth: Payer: Self-pay

## 2020-01-02 ENCOUNTER — Other Ambulatory Visit: Payer: Self-pay

## 2020-01-02 ENCOUNTER — Ambulatory Visit: Payer: Medicare Other | Admitting: Cardiology

## 2020-01-02 DIAGNOSIS — R2689 Other abnormalities of gait and mobility: Secondary | ICD-10-CM

## 2020-01-02 NOTE — Telephone Encounter (Signed)
Patient called requesting PT referral for balance issues - patient has appointment Monday with [facility] for fall and risk prevention. Is it okay to place referral for patient

## 2020-01-02 NOTE — Telephone Encounter (Signed)
Yes okay to go ahead and place the referral for the patient for physical therapy for fall risk

## 2020-01-05 NOTE — Telephone Encounter (Signed)
Referral placed and faxed.

## 2020-01-12 ENCOUNTER — Ambulatory Visit (INDEPENDENT_AMBULATORY_CARE_PROVIDER_SITE_OTHER): Payer: Medicare Other | Admitting: *Deleted

## 2020-01-12 DIAGNOSIS — Z0000 Encounter for general adult medical examination without abnormal findings: Secondary | ICD-10-CM | POA: Diagnosis not present

## 2020-01-12 NOTE — Progress Notes (Signed)
MEDICARE ANNUAL WELLNESS VISIT  01/12/2020  Telephone Visit Disclaimer This Medicare AWV was conducted by telephone due to national recommendations for restrictions regarding the COVID-19 Pandemic (e.g. social distancing).  I verified, using two identifiers, that I am speaking with [name] or their authorized healthcare agent. I discussed the limitations, risks, security, and privacy concerns of performing an evaluation and management service by telephone and the potential availability of an in-person appointment in the future. The patient expressed understanding and agreed to proceed.   Subjective:  [name] is a 87 y.o. female patient of [name], [name] Kaufmann, MD who had a Medicare Annual Wellness Visit today via telephone. [name] is Retired and lives alone. she has 5 children. she reports that she is socially active and does interact with friends/family regularly. she is minimally physically active and enjoys gardening.  Patient Care Team: [name], [name] Kaufmann, MD as PCP - General (Family Medicine) Satira Sark, MD as PCP - Cardiology (Cardiology) Thompson Grayer, MD as PCP - Electrophysiology (Cardiology) Ilean China, RN as Registered Nurse [place] Comment, Allison Quarry, MD as Referring Physician (Optometry)  Advanced Directives 01/12/2020 03/26/2019 12/24/2018 12/03/2018 04/25/2018 09/13/2015 03/10/2015  Does Patient Have a Medical Advance Directive? No No No No No No No  Would patient like information on creating a medical advance directive? Yes (MAU/Ambulatory/Procedural Areas - Information given) - No - Patient declined - No - Patient declined No - patient declined information -  Pre-existing out of facility DNR order (yellow form or pink MOST form) - - - - - - -    Hospital Utilization Over the Past 12 Months: # of hospitalizations or ER visits: 0 # of surgeries: 1  Review of Systems    Patient reports that her overall health is worse compared to last  year.  History obtained from chart review and the patient  Patient Reported Readings (BP, Pulse, CBG, Weight, etc) none  Pain Assessment Pain : No/denies pain     Current Medications & Allergies (verified) Allergies as of 01/12/2020      Reactions   Iodinated Diagnostic Agents Hives, Itching, Rash   03/13/14: symptoms began within two hours of intrathecal injection (myelogram 03/11/14); on shoulders radiating down to stomach.  Relief with Benadryl.  Told patient she would need to pre-med with Benadryl in the future before receiving this contrast.  Brita Romp, RN   Naproxen Hives, Itching, Rash      Medication List       Accurate as of January 12, 2020 10:25 AM. If you have any questions, ask your nurse or doctor.        STOP taking these medications   pantoprazole 40 MG tablet Commonly known as: PROTONIX     TAKE these medications   acetaminophen 500 MG tablet Commonly known as: TYLENOL Take 1,000 mg by mouth every 6 (six) hours as needed for mild pain.   alendronate 70 MG tablet Commonly known as: FOSAMAX TAKE 1 TABLET EVERY WEEK   amiodarone 200 MG tablet Commonly known as: PACERONE TAKE ONE (1) TABLET EACH DAY   calcium-vitamin D 500-200 MG-UNIT tablet Commonly known as: OSCAL WITH D Take 1 tablet by mouth daily with breakfast.   cyanocobalamin 1000 MCG/ML injection Commonly known as: (VITAMIN B-12) Inject 1 mL (1,000 mcg total) into the muscle every 30 (thirty) days.   Eliquis 5 MG Tabs tablet Generic drug: apixaban TAKE ONE TABLET BY MOUTH TWICE DAILY   famotidine 20 MG tablet Commonly  known as: PEPCID TAKE ONE (1) TABLET EACH DAY   latanoprost 0.005 % ophthalmic solution Commonly known as: XALATAN Place 1 drop into both eyes at bedtime.   multivitamin tablet Take 1 tablet by mouth every morning.   nadolol 40 MG tablet Commonly known as: CORGARD Take 1.5 tablets (60 mg total) by mouth daily.   nitroGLYCERIN 0.4 MG SL tablet Commonly known as:  NITROSTAT Place 1 tablet (0.4 mg total) under the tongue every 5 (five) minutes as needed for chest pain. Up to 3 doses. If no relief after 3 rd dose, proceed to the ED       History (reviewed): Past Medical History:  Diagnosis Date  . Arthritis   . Atrial flutter ([place])    s/p RFA 2003  . Breast cancer ([place])    1980's - lumpectomy only  . Colon polyps   . Diverticulosis   . DVT, lower extremity ([place]) 1998 and 1999   Bilateral  . Essential hypertension   . GERD (gastroesophageal reflux disease)   . Glaucoma   . IBS (irritable bowel syndrome)   . Mixed hyperlipidemia   . Osteoporosis   . Persistent atrial fibrillation ([place])   . Pneumonia 2011  . Sick sinus syndrome (H[facility])    PPM ([facility])  . Urinary tract infection   . Varicose veins    Past Surgical History:  Procedure Laterality Date  . BREAST LUMPECTOMY  1980's   Right breast   . CARDIAC CATHETERIZATION  1999   Normal coronaries  . Cataracts Bilateral   . CHOLECYSTECTOMY    . COLONOSCOPY W/ BIOPSIES AND POLYPECTOMY  03/2003, 05/2008, 02/20/2011   severe diverticulosis, tubulovillous adenoma polyp, internal and external hemorrhoids 2012: severe diverticulosis, 4 mm polyp, hemorrhoids  . CYST REMOVAL TRUNK     under breast = on belly   . DILATION AND CURETTAGE OF UTERUS    . EYE SURGERY     cataracts - bilateral   . Fractured wrist Left 2013   repair  . INSERT / REPLACE / REMOVE PACEMAKER    . KNEE ARTHROSCOPY WITH MEDIAL MENISECTOMY Right 10/21/2014   Procedure: RIGHT KNEE ARTHROSCOPY WITH MEDIAL MENISECTOM,lateral menisectomy,synovectomy suprapatellar pouch;  Surgeon: Latanya Maudlin, MD;  Location: [facility];  Service: Orthopedics;  Laterality: Right;  . PACEMAKER GENERATOR CHANGE  06/18/12   [facility] DR RF, Dr Rayann Heman  . PACEMAKER INSERTION  2003  . PERMANENT PACEMAKER GENERATOR CHANGE N/A 06/18/2012   Procedure: PERMANENT PACEMAKER GENERATOR CHANGE;  Surgeon: Thompson Grayer, MD;  Location[facility] CATH LAB;  Service:  Cardiovascular;  Laterality: N/A;  . TOTAL KNEE ARTHROPLASTY  12/20/2011   Procedure: TOTAL KNEE ARTHROPLASTY;  Surgeon: Tobi Bastos, MD;  Location: [facility];  Service: Orthopedics;  Laterality: Left;  . TOTAL KNEE ARTHROPLASTY Right 02/10/2015   Procedure: TOTAL RIGHT KNEE ARTHROPLASTY;  Surgeon: Latanya Maudlin, MD;  Location: [facility];  Service: Orthopedics;  Laterality: Right;   Family History  Problem Relation Age of Onset  . Cancer Father   . Stroke Mother   . Alzheimer's disease Brother   . Colon cancer Other        Grandfather  . Heart attack Other        CHF grandmother  . Alzheimer's disease Sister   . Colon cancer Maternal Grandfather   . Cancer Son        prostate   . Colon cancer Sister   . Colon cancer Brother   . Heart disease Brother   .  Atrial fibrillation Brother   . Diabetes Brother   . Cancer Son        prostate   Social History   Socioeconomic History  . Marital status: Widowed    Spouse name: Not on file  . Number of children: 5  . Years of education: 34  . Highest education level: 12th grade  Occupational History  . Occupation: Retired    Comment: resturant / cafe   Tobacco Use  . Smoking status: Never Smoker  . Smokeless tobacco: Never Used  Vaping Use  . Vaping Use: Never used  Substance and Sexual Activity  . Alcohol use: No    Alcohol/week: 0.0 standard drinks  . Drug use: No  . Sexual activity: Not on file  Other Topics Concern  . Not on file  Social History Narrative   Widowed  - 5 sons = 1 lives local    Social Determinants of Health   Financial Resource Strain: Low Risk   . Difficulty of Paying Living Expenses: Not hard at all  Food Insecurity: No Food Insecurity  . Worried About Charity fundraiser in the Last Year: Never true  . Ran Out of Food in the Last Year: Never true  Transportation Needs: No Transportation Needs  . Lack of Transportation (Medical): No  . Lack of Transportation (Non-Medical): No  Physical Activity:  Sufficiently Active  . Days of Exercise per Week: 7 days  . Minutes of Exercise per Session: 30 min  Stress: No Stress Concern Present  . Feeling of Stress : Not at all  Social Connections: Moderately Isolated  . Frequency of Communication with Friends and Family: More than three times a week  . Frequency of Social Gatherings with Friends and Family: More than three times a week  . Attends Religious Services: More than 4 times per year  . Active Member of Clubs or Organizations: No  . Attends Archivist Meetings: Never  . Marital Status: Widowed    Activities of Daily Living In your present state of health, do you have any difficulty performing the following activities: 01/12/2020 10/20/2019  Hearing? N N  Vision? N N  Comment - has glaucoma but doing ok  Difficulty concentrating or making decisions? N N  Walking or climbing stairs? Y Y  Comment balance avoids stairs when possible  Dressing or bathing? N N  Doing errands, shopping? N N  Preparing Food and eating ? N N  Using the Toilet? N N  In the past six months, have you accidently leaked urine? N N  Do you have problems with loss of bowel control? N N  Managing your Medications? N N  Managing your Finances? N N  Housekeeping or managing your Housekeeping? N N  Some recent data might be hidden    Patient Education/ Literacy What is the last grade level you completed in school?: 12  Exercise Current Exercise Habits: Home exercise routine, Type of exercise: stretching;walking, Time (Minutes): 30, Frequency (Times/Week): 7, Weekly Exercise (Minutes/Week): 210, Intensity: Mild, Exercise limited by: None identified  Diet Patient reports consuming 2 meals a day and 0 snack(s) a day Patient reports that her primary diet is: Regular Patient reports that she does have regular access to food.   Depression Screen PHQ 2/9 Scores 01/12/2020 12/26/2019 12/24/2018 11/25/2018  PHQ - 2 Score 0 0 1 0     Fall Risk Fall Risk   01/12/2020 12/26/2019 12/24/2018 11/25/2018  Falls in the past year? 1 0 0  0  Number falls in past yr: 0 - - -  Injury with Fall? 1 - - -  Risk for fall due to : Impaired balance/gait;History of fall(s) - - -  Follow up Falls prevention discussed - - -     Objective:  [name] seemed alert and oriented and she participated appropriately during our telephone visit.  Blood Pressure Weight BMI  BP Readings from Last 3 Encounters:  12/26/19 127/75  10/17/19 130/80  07/04/19 (!) 145/84   Wt Readings from Last 3 Encounters:  12/26/19 172 lb (78 kg)  10/17/19 176 lb 3.2 oz (79.9 kg)  07/04/19 177 lb 6.4 oz (80.5 kg)   BMI Readings from Last 1 Encounters:  12/26/19 29.52 kg/m    *Unable to obtain current vital signs, weight, and BMI due to telephone visit type  Hearing/Vision  . [name] did not seem to have difficulty with hearing/understanding during the telephone conversation . Reports that she has had a formal eye exam by an eye care professional within the past year . Reports that she has not had a formal hearing evaluation within the past year *Unable to fully assess hearing and vision during telephone visit type  Cognitive Function: 6CIT Screen 01/12/2020 12/24/2018  What Year? 0 points 0 points  What month? 0 points 0 points  What time? 0 points 0 points  Count back from 20 0 points 0 points  Months in reverse 0 points 0 points  Repeat phrase 2 points 0 points  Total Score 2 0   (Normal:0-7, Significant for Dysfunction: >8)  Normal Cognitive Function Screening: Yes   Immunization & Health Maintenance Record Immunization History  Administered Date(s) Administered  . Fluad Quad(high Dose 65+) 03/18/2019  . Influenza, High Dose Seasonal PF 04/02/2015, 04/25/2017, 03/13/2018  . Influenza,trivalent, recombinat, inj, PF 03/13/2014  . Pneumococcal Conjugate-13 04/02/2015  . Pneumococcal Polysaccharide-23 08/16/2016  . Tdap 01/26/2010    Health Maintenance  Topic  Date Due  . INFLUENZA VACCINE  01/11/2020  . COVID-19 Vaccine (1) 06/28/2020 (Originally 05/18/1941)  . TETANUS/TDAP  01/27/2020  . DEXA SCAN  Completed  . PNA vac Low Risk Adult  Completed       Assessment  This is a routine wellness examination for [name].  Health Maintenance: Due or Overdue Health Maintenance Due  Topic Date Due  . INFLUENZA VACCINE  01/11/2020    [name] does not need a referral for [employer] Assistance: Care Management:   no Social Work:    no Prescription Assistance:  no Nutrition/Diabetes Education:  no   Plan:  Personalized Goals Goals Addressed            This Visit's Progress   . Prevent falls   On track    Stay active ( garden)  Lacinda Axon more / bake more     . Prevent falls       Has started using cane for stability      Personalized Health Maintenance & Screening Recommendations  up to date, except shingles vaccine  Lung Cancer Screening Recommended: no (Low Dose CT Chest recommended if Age 68-80 years, 30 pack-year currently smoking OR have quit w/in past 15 years) Hepatitis C Screening recommended: no HIV Screening recommended: no  Advanced Directives: Written information information placed up front for patient to pick up. prepared per patient's request.  Referrals & Orders No orders of the defined types were placed in this encounter.   Follow-up Plan . Follow-up with [name], [name] Kaufmann,  MD as planned on 04/28/20 . Offer shingles vaccine at next visit. . All health maintenance is up to date. . Pt is still driving and independent with all ADL's. . Advanced directive information placed up front for pt to pick up per her request. . No difficulties hearing. Glasses up to date. . Pt voices no healthcare concerns. . ADL printed and placed up front for pick up    I have personally reviewed and noted the following in the patient's chart:   . Medical and social history . Use of alcohol, tobacco or illicit  drugs  . Current medications and supplements . Functional ability and status . Nutritional status . Physical activity . Advanced directives . List of other physicians . Hospitalizations, surgeries, and ER visits in previous 12 months . Vitals . Screenings to include cognitive, depression, and falls . Referrals and appointments  In addition, I have reviewed and discussed with [facility] certain preventive protocols, quality metrics, and best practice recommendations. A written personalized care plan for preventive services as well as general preventive health recommendations is available and can be mailed to the patient at her request.      Rana Snare, LPN  [id number]/[id number]

## 2020-01-19 DIAGNOSIS — Z9181 History of falling: Secondary | ICD-10-CM | POA: Diagnosis not present

## 2020-01-19 DIAGNOSIS — R5381 Other malaise: Secondary | ICD-10-CM | POA: Diagnosis not present

## 2020-01-19 DIAGNOSIS — R262 Difficulty in walking, not elsewhere classified: Secondary | ICD-10-CM | POA: Diagnosis not present

## 2020-01-20 ENCOUNTER — Ambulatory Visit (INDEPENDENT_AMBULATORY_CARE_PROVIDER_SITE_OTHER): Payer: Medicare Other | Admitting: *Deleted

## 2020-01-20 DIAGNOSIS — I480 Paroxysmal atrial fibrillation: Secondary | ICD-10-CM | POA: Diagnosis not present

## 2020-01-20 DIAGNOSIS — I10 Essential (primary) hypertension: Secondary | ICD-10-CM

## 2020-01-20 DIAGNOSIS — M810 Age-related osteoporosis without current pathological fracture: Secondary | ICD-10-CM

## 2020-01-20 DIAGNOSIS — R2689 Other abnormalities of gait and mobility: Secondary | ICD-10-CM

## 2020-01-20 NOTE — Patient Instructions (Signed)
Visit Information  Goals Addressed              This Visit's Progress     Patient Stated   .  "I want to work on my balance so I don't fall" (pt-stated)        CARE PLAN ENTRY (see longitudinal plan of care for additional care plan information)  Current Barriers:  . Care Coordination needs related to problems with balance in a patient with Afib, HTN, and osteoporosis (disease states)  Nurse Case Manager Clinical Goal(s):  Marland Kitchen Over the next 30 days, patient will work with physical therapist for balance and gait training . Over the next 90 days, patient will keep all scheduled medical appointments  Interventions:  . Inter-disciplinary care team collaboration (see longitudinal plan of care) . Chart reviewed including recent office notes and referral notes . Discussed physical therapy and plan of care o Patient feels that it is helping . Discussed fall prevention . Encouraged patient to  reach out to [facility] team as needed . Encouraged patient to report any new or worsening symptoms to PCP  Patient Self Care Activities:  . Performs ADL's independently . Performs IADL's independently  Initial goal documentation       Other   .  Chronic Disease Management Needs   On track     CARE PLAN ENTRY (see longtitudinal plan of care for additional care plan information)  Current Barriers:  . Chronic Disease Management support, education, and care coordination needs related to HTN, Afib, Osteoporosis, diverticulosis, HLD, glaucoma  Clinical Goal(s) related to HTN, Afib, Osteoporosis, diverticulosis, HLD, glaucoma:  Over the next 45 days, patient will:  . Work with the care management team to address educational, disease management, and care coordination needs  . Call provider office for new or worsened signs and symptoms  . Call care management team with questions or concerns  Interventions related to HTN, Afib, Osteoporosis, diverticulosis, HLD, glaucoma:  . Evaluation of current  treatment plans and patient's adherence to plan as established by provider . Assessed patient's education and care coordination needs . Chart reviewed including recent office notes and lab results . Reviewed bone density scan results from 06/19/19.  o Patient taking fosamax. Was prescribed initially by Dr Edrick Oh and has been on it for approx 4 to 5 years.  o Will recommend to provider that he may want to consider a drug holiday.  . Discussed physical therapy o Patient says that it is going well . Discussed blood pressure management o No patient complaints o Taking mediation regularly o Checks BP as needed . Upcoming appointments reviewed . Provided with CCM contact information and encouraged to reach out as needed  Patient Self Care Activities related to HTN, Afib, Osteoporosis, diverticulosis, HLD, glaucoma:  . Patient is unable to independently self-manage chronic health conditions  Please see past updates related to this goal by clicking on the "Past Updates" button in the selected goal         Patient verbalizes understanding of instructions provided today.   Follow-up Plan Telephone follow up appointment with care management team member scheduled for: RN Care Manager 03/23/20 Dr Dettinger 04/28/20   [name], BSN, RN-BC [place] [facility] [place] Management Direct Dial: [phone]

## 2020-01-20 NOTE — Chronic Care Management (AMB) (Signed)
Chronic Care Management   Follow Up Note   01/20/2020 Name: [name] MRN: [id number] DOB: 1929-01-18  Referred by: Dettinger, Fransisca Kaufmann, MD Reason for referral : Chronic Care Management (RN follow up)   [name] is a 86 y.o. year old female who is a primary care patient of Dettinger, Fransisca Kaufmann, MD. The CCM team was consulted for assistance with chronic disease management and care coordination needs.    Review of patient status, including review of consultants reports, relevant laboratory and other test results, and collaboration with appropriate care team members and the patient's provider was performed as part of comprehensive patient evaluation and provision of chronic care management services.    SDOH (Social Determinants of Health) assessments performed: No See Care Plan activities for detailed interventions related to [facility])     Outpatient Encounter Medications as of 01/20/2020  Medication Sig  . acetaminophen (TYLENOL) 500 MG tablet Take 1,000 mg by mouth every 6 (six) hours as needed for mild pain.  Marland Kitchen alendronate (FOSAMAX) 70 MG tablet TAKE 1 TABLET EVERY WEEK  . amiodarone (PACERONE) 200 MG tablet TAKE ONE (1) TABLET EACH DAY  . calcium-vitamin D (OSCAL WITH D) 500-200 MG-UNIT tablet Take 1 tablet by mouth daily with breakfast.   . cyanocobalamin (,VITAMIN B-12,) 1000 MCG/ML injection Inject 1 mL (1,000 mcg total) into the muscle every 30 (thirty) days.  Marland Kitchen ELIQUIS 5 MG TABS tablet TAKE ONE TABLET BY MOUTH TWICE DAILY  . famotidine (PEPCID) 20 MG tablet TAKE ONE (1) TABLET EACH DAY  . latanoprost (XALATAN) 0.005 % ophthalmic solution Place 1 drop into both eyes at bedtime.  . Multiple Vitamin (MULTIVITAMIN) tablet Take 1 tablet by mouth every morning.   . nadolol (CORGARD) 40 MG tablet Take 1.5 tablets (60 mg total) by mouth daily.  . nitroGLYCERIN (NITROSTAT) 0.4 MG SL tablet Place 1 tablet (0.4 mg total) under the tongue every 5 (five) minutes as needed for  chest pain. Up to 3 doses. If no relief after 3 rd dose, proceed to the ED   Facility-Administered Encounter Medications as of 01/20/2020  Medication  . bupivacaine liposome (EXPAREL) 1.3 % injection 266 mg  . cyanocobalamin ((VITAMIN B-12)) injection 1,000 mcg     BP Readings from Last 3 Encounters:  12/26/19 127/75  10/17/19 130/80  07/04/19 (!) 145/84   Last Dexa Scan: 06/19/19 results -2.6  RN Care Plan              This Visit's Progress     Patient Stated   .  "I want to work on my balance so I don't fall" (pt-stated)        CARE PLAN ENTRY (see longitudinal plan of care for additional care plan information)  Current Barriers:  . Care Coordination needs related to problems with balance in a patient with Afib, HTN, and osteoporosis (disease states)  Nurse Case Manager Clinical Goal(s):  Marland Kitchen Over the next 30 days, patient will work with physical therapist for balance and gait training . Over the next 90 days, patient will keep all scheduled medical appointments  Interventions:  . Inter-disciplinary care team collaboration (see longitudinal plan of care) . Chart reviewed including recent office notes and referral notes . Discussed physical therapy and plan of care o Patient feels that it is helping . Discussed fall prevention . Encouraged patient to  reach out to [facility] team as needed . Encouraged patient to report any new or worsening symptoms to PCP  Patient Self  Care Activities:  . Performs ADL's independently . Performs IADL's independently  Initial goal documentation       Other   .  Chronic Disease Management Needs   On track     CARE PLAN ENTRY (see longtitudinal plan of care for additional care plan information)  Current Barriers:  . Chronic Disease Management support, education, and care coordination needs related to HTN, Afib, Osteoporosis, diverticulosis, HLD, glaucoma  Clinical Goal(s) related to HTN, Afib, Osteoporosis, diverticulosis, HLD, glaucoma:   Over the next 45 days, patient will:  . Work with the care management team to address educational, disease management, and care coordination needs  . Call provider office for new or worsened signs and symptoms  . Call care management team with questions or concerns  Interventions related to HTN, Afib, Osteoporosis, diverticulosis, HLD, glaucoma:  . Evaluation of current treatment plans and patient's adherence to plan as established by provider . Assessed patient's education and care coordination needs . Chart reviewed including recent office notes and lab results . Reviewed bone density scan results from 06/19/19.  o Patient taking fosamax. Was prescribed initially by Dr Edrick Oh and has been on it for approx 4 to 5 years.  o Will recommend to provider that he may want to consider a drug holiday.  . Discussed physical therapy o Patient says that it is going well . Discussed blood pressure management o No patient complaints o Taking mediation regularly o Checks BP as needed . Upcoming appointments reviewed . Provided with CCM contact information and encouraged to reach out as needed  Patient Self Care Activities related to HTN, Afib, Osteoporosis, diverticulosis, HLD, glaucoma:  . Patient is unable to independently self-manage chronic health conditions  Please see past updates related to this goal by clicking on the "Past Updates" button in the selected goal          Plan:   Telephone follow up appointment with care management team member scheduled for: RN Care Manager 03/23/20 Dr Dettinger 04/28/20   [name], BSN, RN-BC [place] [facility] [place] Management Direct Dial: [phone]

## 2020-01-23 ENCOUNTER — Other Ambulatory Visit: Payer: Self-pay | Admitting: Family Medicine

## 2020-01-26 ENCOUNTER — Ambulatory Visit: Payer: Medicare Other

## 2020-01-26 DIAGNOSIS — R5381 Other malaise: Secondary | ICD-10-CM | POA: Diagnosis not present

## 2020-01-26 DIAGNOSIS — R262 Difficulty in walking, not elsewhere classified: Secondary | ICD-10-CM | POA: Diagnosis not present

## 2020-01-26 DIAGNOSIS — Z9181 History of falling: Secondary | ICD-10-CM | POA: Diagnosis not present

## 2020-01-27 ENCOUNTER — Ambulatory Visit: Payer: Medicare Other

## 2020-01-28 ENCOUNTER — Ambulatory Visit (INDEPENDENT_AMBULATORY_CARE_PROVIDER_SITE_OTHER): Payer: Medicare Other | Admitting: *Deleted

## 2020-01-28 ENCOUNTER — Other Ambulatory Visit: Payer: Self-pay

## 2020-01-28 DIAGNOSIS — E538 Deficiency of other specified B group vitamins: Secondary | ICD-10-CM | POA: Diagnosis not present

## 2020-01-28 NOTE — Progress Notes (Signed)
Vitamin b12 injection given and patient tolerated well.  

## 2020-02-04 NOTE — Progress Notes (Signed)
Cardiology Office Note  Date: 02/05/2020   ID: [name], DOB 1928-07-06, MRN [id number]  PCP:  Dettinger, Fransisca Kaufmann, MD  Cardiologist:  Rozann Lesches, MD Electrophysiologist:  Thompson Grayer, MD   Chief Complaint  Patient presents with  . Cardiac follow-up    History of Present Illness: [name] is an 86 y.o. female last seen in January.  She presents for a routine visit.  No report of significant palpitations or chest pain, no dizziness or syncope.  She follows with Dr. Rayann Heman, St. Jude pacemaker in place.  Recent device check in July revealed normal function.  I reviewed her recent lab work from July.  She states that on her own she cut amiodarone from 200 mg to 100 mg daily, feels like she has more energy since doing that.  TSH and LFTs were normal.  Past Medical History:  Diagnosis Date  . Arthritis   . Atrial flutter ([place])    s/p RFA 2003  . Breast cancer ([place])    1980's - lumpectomy only  . Colon polyps   . Diverticulosis   . DVT, lower extremity ([place]) 1998 and 1999   Bilateral  . Essential hypertension   . GERD (gastroesophageal reflux disease)   . Glaucoma   . IBS (irritable bowel syndrome)   . Mixed hyperlipidemia   . Osteoporosis   . Persistent atrial fibrillation ([place])   . Pneumonia 2011  . Sick sinus syndrome ([facility])    PPM ([facility])  . Urinary tract infection   . Varicose veins     Past Surgical History:  Procedure Laterality Date  . BREAST LUMPECTOMY  1980's   Right breast   . CARDIAC CATHETERIZATION  1999   Normal coronaries  . Cataracts Bilateral   . CHOLECYSTECTOMY    . COLONOSCOPY W/ BIOPSIES AND POLYPECTOMY  03/2003, 05/2008, 02/20/2011   severe diverticulosis, tubulovillous adenoma polyp, internal and external hemorrhoids 2012: severe diverticulosis, 4 mm polyp, hemorrhoids  . CYST REMOVAL TRUNK     under breast = on belly   . DILATION AND CURETTAGE OF UTERUS    . EYE SURGERY     cataracts - bilateral   . Fractured  wrist Left 2013   repair  . INSERT / REPLACE / REMOVE PACEMAKER    . KNEE ARTHROSCOPY WITH MEDIAL MENISECTOMY Right 10/21/2014   Procedure: RIGHT KNEE ARTHROSCOPY WITH MEDIAL MENISECTOM,lateral menisectomy,synovectomy suprapatellar pouch;  Surgeon: Latanya Maudlin, MD;  Location: [facility];  Service: Orthopedics;  Laterality: Right;  . PACEMAKER GENERATOR CHANGE  06/18/12   [facility] DR RF, Dr Rayann Heman  . PACEMAKER INSERTION  2003  . PERMANENT PACEMAKER GENERATOR CHANGE N/A 06/18/2012   Procedure: PERMANENT PACEMAKER GENERATOR CHANGE;  Surgeon: Thompson Grayer, MD;  Location: [facility] CATH LAB;  Service: Cardiovascular;  Laterality: N/A;  . TOTAL KNEE ARTHROPLASTY  12/20/2011   Procedure: TOTAL KNEE ARTHROPLASTY;  Surgeon: Tobi Bastos, MD;  Location: [facility];  Service: Orthopedics;  Laterality: Left;  . TOTAL KNEE ARTHROPLASTY Right 02/10/2015   Procedure: TOTAL RIGHT KNEE ARTHROPLASTY;  Surgeon: Latanya Maudlin, MD;  Location: [facility];  Service: Orthopedics;  Laterality: Right;    Current Outpatient Medications  Medication Sig Dispense Refill  . acetaminophen (TYLENOL) 500 MG tablet Take 1,000 mg by mouth every 6 (six) hours as needed for mild pain.    Marland Kitchen alendronate (FOSAMAX) 70 MG tablet TAKE 1 TABLET EVERY WEEK 12 tablet 1  . amiodarone (PACERONE) 200 MG tablet Take 100 mg by  mouth daily.    . calcium-vitamin D (OSCAL WITH D) 500-200 MG-UNIT tablet Take 1 tablet by mouth daily with breakfast.     . cyanocobalamin (,VITAMIN B-12,) 1000 MCG/ML injection Inject 1 mL (1,000 mcg total) into the muscle every 30 (thirty) days. 3 mL 1  . ELIQUIS 5 MG TABS tablet TAKE ONE TABLET BY MOUTH TWICE DAILY 60 tablet 1  . famotidine (PEPCID) 20 MG tablet TAKE ONE (1) TABLET EACH DAY 30 tablet 11  . latanoprost (XALATAN) 0.005 % ophthalmic solution Place 1 drop into both eyes at bedtime.    . Multiple Vitamin (MULTIVITAMIN) tablet Take 1 tablet by mouth every morning.     . nadolol (CORGARD) 40 MG tablet Take 1.5 tablets  (60 mg total) by mouth daily. 135 tablet 3  . nitroGLYCERIN (NITROSTAT) 0.4 MG SL tablet Place 1 tablet (0.4 mg total) under the tongue every 5 (five) minutes as needed for chest pain. Up to 3 doses. If no relief after 3 rd dose, proceed to the ED 75 tablet 1   Current Facility-Administered Medications  Medication Dose Route Frequency Provider Last Rate Last Admin  . cyanocobalamin ((VITAMIN B-12)) injection 1,000 mcg  1,000 mcg Intramuscular Q30 days Dettinger, Fransisca Kaufmann, MD   1,000 mcg at 01/28/20 1133   Facility-Administered Medications Ordered in Other Visits  Medication Dose Route Frequency Provider Last Rate Last Admin  . bupivacaine liposome (EXPAREL) 1.3 % injection 266 mg  20 mL Infiltration Once [place], [employer], PA-C       Allergies:  Iodinated diagnostic agents and Naproxen   ROS:   No palpitations or syncope.  Physical Exam: VS:  BP 126/80   Pulse 64   Ht 5\' 4"  (1.626 m)   Wt 177 lb (80.3 kg)   SpO2 96%   BMI 30.38 kg/m , BMI Body mass index is 30.38 kg/m.  Wt Readings from Last 3 Encounters:  02/05/20 177 lb (80.3 kg)  12/26/19 172 lb (78 kg)  10/17/19 176 lb 3.2 oz (79.9 kg)    General: Elderly woman, appears comfortable at rest. HEENT: Conjunctiva and lids normal, wearing a mask. Neck: Supple, no elevated JVP or carotid bruits, no thyromegaly. Lungs: Clear to auscultation, nonlabored breathing at rest. Cardiac: Regular rate and rhythm, no S3, soft systolic murmur, no pericardial rub. Extremities: Mild ankle edema, distal pulses 2+.  ECG:  An ECG dated 10/17/2019 was personally reviewed today and demonstrated:  Dual-chamber paced rhythm.  Recent Labwork: 12/26/2019: ALT 14; AST 21; BUN 9; Creatinine, Ser 0.87; Hemoglobin 11.6; Platelets 144; Potassium 4.2; Sodium 140; TSH 1.150     Component Value Date/Time   CHOL 171 12/26/2019 1509   TRIG 89 12/26/2019 1509   HDL 49 12/26/2019 1509   CHOLHDL 3.5 12/26/2019 1509   LDLCALC 105 (H) 12/26/2019 1509    Other  Studies Reviewed Today:  Lexiscan Myoview 08/01/2012: No perfusion evidence of scar or ischemia with LVEF 63%.  Assessment and Plan:  1.  Paroxysmal atrial fibrillation, CHA2DS2-VASc or is 4.  She continues to tolerate Eliquis without bleeding problems.  Also on low-dose amiodarone with recent normal LFTs and TSH.  2.  Sick sinus syndrome, St. Jude pacemaker in place with follow-up with Dr. Rayann Heman.  Recent device check indicated normal function.  3.  Essential hypertension, blood pressure is well controlled today.  Medication Adjustments/Labs and Tests Ordered: Current medicines are reviewed at length with the patient today.  Concerns regarding medicines are outlined above.   Tests Ordered: No orders  of the defined types were placed in this encounter.   Medication Changes: No orders of the defined types were placed in this encounter.   Disposition:  Follow up 6 months in the [place] office.  Signed, Satira Sark, MD, [facility] 02/05/2020 10:15 AM    [facility] [place], [place], [place] [address] Phone: [phone]; Fax: [phone]

## 2020-02-05 ENCOUNTER — Other Ambulatory Visit: Payer: Self-pay

## 2020-02-05 ENCOUNTER — Encounter: Payer: Self-pay | Admitting: Cardiology

## 2020-02-05 ENCOUNTER — Ambulatory Visit: Payer: Medicare Other | Admitting: Cardiology

## 2020-02-05 VITALS — BP 126/80 | HR 64 | Ht 64.0 in | Wt 177.0 lb

## 2020-02-05 DIAGNOSIS — I495 Sick sinus syndrome: Secondary | ICD-10-CM

## 2020-02-05 DIAGNOSIS — I10 Essential (primary) hypertension: Secondary | ICD-10-CM

## 2020-02-05 DIAGNOSIS — I480 Paroxysmal atrial fibrillation: Secondary | ICD-10-CM | POA: Diagnosis not present

## 2020-02-05 NOTE — Patient Instructions (Addendum)

## 2020-02-12 DIAGNOSIS — R5381 Other malaise: Secondary | ICD-10-CM | POA: Diagnosis not present

## 2020-02-12 DIAGNOSIS — Z9181 History of falling: Secondary | ICD-10-CM | POA: Diagnosis not present

## 2020-02-12 DIAGNOSIS — R262 Difficulty in walking, not elsewhere classified: Secondary | ICD-10-CM | POA: Diagnosis not present

## 2020-02-19 DIAGNOSIS — R5381 Other malaise: Secondary | ICD-10-CM | POA: Diagnosis not present

## 2020-02-19 DIAGNOSIS — Z9181 History of falling: Secondary | ICD-10-CM | POA: Diagnosis not present

## 2020-02-19 DIAGNOSIS — R262 Difficulty in walking, not elsewhere classified: Secondary | ICD-10-CM | POA: Diagnosis not present

## 2020-03-01 ENCOUNTER — Other Ambulatory Visit: Payer: Self-pay

## 2020-03-01 ENCOUNTER — Ambulatory Visit (INDEPENDENT_AMBULATORY_CARE_PROVIDER_SITE_OTHER): Payer: Medicare Other | Admitting: Family Medicine

## 2020-03-01 DIAGNOSIS — E538 Deficiency of other specified B group vitamins: Secondary | ICD-10-CM | POA: Diagnosis not present

## 2020-03-03 NOTE — Progress Notes (Signed)
Patient: home Provider: office

## 2020-03-04 DIAGNOSIS — R262 Difficulty in walking, not elsewhere classified: Secondary | ICD-10-CM | POA: Diagnosis not present

## 2020-03-04 DIAGNOSIS — Z9181 History of falling: Secondary | ICD-10-CM | POA: Diagnosis not present

## 2020-03-04 DIAGNOSIS — R5381 Other malaise: Secondary | ICD-10-CM | POA: Diagnosis not present

## 2020-03-22 ENCOUNTER — Telehealth: Payer: Self-pay

## 2020-03-22 NOTE — Telephone Encounter (Signed)
Prescription pulled from providers desk,

## 2020-03-22 NOTE — Telephone Encounter (Signed)
Written Rx placed on providers desk 

## 2020-03-23 ENCOUNTER — Telehealth: Payer: Self-pay | Admitting: *Deleted

## 2020-03-23 ENCOUNTER — Telehealth: Payer: Medicare Other | Admitting: *Deleted

## 2020-03-23 ENCOUNTER — Other Ambulatory Visit: Payer: Self-pay | Admitting: Family Medicine

## 2020-03-23 ENCOUNTER — Ambulatory Visit (INDEPENDENT_AMBULATORY_CARE_PROVIDER_SITE_OTHER): Payer: Medicare Other

## 2020-03-23 DIAGNOSIS — I480 Paroxysmal atrial fibrillation: Secondary | ICD-10-CM | POA: Diagnosis not present

## 2020-03-23 LAB — CUP PACEART REMOTE DEVICE CHECK
Battery Remaining Longevity: 34 mo
Battery Remaining Percentage: 35 %
Battery Voltage: 2.83 V
Brady Statistic AP VP Percent: 95 %
Brady Statistic AP VS Percent: 1 %
Brady Statistic AS VP Percent: 3.7 %
Brady Statistic AS VS Percent: 1 %
Brady Statistic RA Percent Paced: 95 %
Brady Statistic RV Percent Paced: 99 %
Date Time Interrogation Session: 20211012023923
Implantable Lead Implant Date: 20031028
Implantable Lead Implant Date: 20031028
Implantable Lead Location: 753859
Implantable Lead Location: 753860
Implantable Pulse Generator Implant Date: 20140107
Lead Channel Impedance Value: 360 Ohm
Lead Channel Impedance Value: 450 Ohm
Lead Channel Pacing Threshold Amplitude: 1 V
Lead Channel Pacing Threshold Amplitude: 1.25 V
Lead Channel Pacing Threshold Pulse Width: 0.5 ms
Lead Channel Pacing Threshold Pulse Width: 0.5 ms
Lead Channel Sensing Intrinsic Amplitude: 0.2 mV
Lead Channel Sensing Intrinsic Amplitude: 10.3 mV
Lead Channel Setting Pacing Amplitude: 2 V
Lead Channel Setting Pacing Amplitude: 2.5 V
Lead Channel Setting Pacing Pulse Width: 0.5 ms
Lead Channel Setting Sensing Sensitivity: 2 mV
Pulse Gen Model: 2210
Pulse Gen Serial Number: 7438567

## 2020-03-23 NOTE — Telephone Encounter (Signed)
  Chronic Care Management   Outreach Note  03/23/2020 Name: [name] MRN: [id number] DOB: February 05, 1929  Referred by: Dettinger, Fransisca Kaufmann, MD Reason for referral : No chief complaint on file.   An unsuccessful telephone follow-up was attempted today. The patient was referred to the case management team for assistance with care management and care coordination.   Follow Up Plan: A HIPAA compliant phone message was left for the patient providing contact information and requesting a return call.  The care management team will reach out to the patient again over the next 30 days to discussed rescheduling follow-up visit  [name], BSN, RN-BC [place] / [place] Management Direct Dial: [phone]

## 2020-03-24 NOTE — Progress Notes (Signed)
Remote pacemaker transmission.   

## 2020-03-24 NOTE — Telephone Encounter (Signed)
Pt has been r/s  

## 2020-03-30 DIAGNOSIS — R262 Difficulty in walking, not elsewhere classified: Secondary | ICD-10-CM | POA: Diagnosis not present

## 2020-03-30 DIAGNOSIS — Z9181 History of falling: Secondary | ICD-10-CM | POA: Diagnosis not present

## 2020-03-30 DIAGNOSIS — R5381 Other malaise: Secondary | ICD-10-CM | POA: Diagnosis not present

## 2020-03-31 ENCOUNTER — Ambulatory Visit (INDEPENDENT_AMBULATORY_CARE_PROVIDER_SITE_OTHER): Payer: Medicare Other

## 2020-03-31 DIAGNOSIS — E538 Deficiency of other specified B group vitamins: Secondary | ICD-10-CM

## 2020-03-31 NOTE — Progress Notes (Signed)
Cyanocobalamin injection given to left deltoid.  Patient tolerated well. 

## 2020-04-05 DIAGNOSIS — Z9181 History of falling: Secondary | ICD-10-CM | POA: Diagnosis not present

## 2020-04-05 DIAGNOSIS — R5381 Other malaise: Secondary | ICD-10-CM | POA: Diagnosis not present

## 2020-04-05 DIAGNOSIS — R262 Difficulty in walking, not elsewhere classified: Secondary | ICD-10-CM | POA: Diagnosis not present

## 2020-04-20 DIAGNOSIS — Z1231 Encounter for screening mammogram for malignant neoplasm of breast: Secondary | ICD-10-CM | POA: Diagnosis not present

## 2020-04-21 ENCOUNTER — Other Ambulatory Visit: Payer: Self-pay | Admitting: Family Medicine

## 2020-04-28 ENCOUNTER — Encounter: Payer: Self-pay | Admitting: Family Medicine

## 2020-04-28 ENCOUNTER — Ambulatory Visit (INDEPENDENT_AMBULATORY_CARE_PROVIDER_SITE_OTHER): Payer: Medicare Other | Admitting: Family Medicine

## 2020-04-28 ENCOUNTER — Other Ambulatory Visit: Payer: Self-pay

## 2020-04-28 VITALS — BP 143/79 | HR 60 | Temp 97.0°F | Ht 64.0 in | Wt 172.0 lb

## 2020-04-28 DIAGNOSIS — R5383 Other fatigue: Secondary | ICD-10-CM | POA: Diagnosis not present

## 2020-04-28 DIAGNOSIS — E782 Mixed hyperlipidemia: Secondary | ICD-10-CM | POA: Diagnosis not present

## 2020-04-28 DIAGNOSIS — I10 Essential (primary) hypertension: Secondary | ICD-10-CM

## 2020-04-28 LAB — LIPID PANEL

## 2020-04-28 NOTE — Progress Notes (Signed)
BP (!) 143/79   Pulse 60   Temp (!) 97 F (36.1 C)   Ht $R'5\' 4"'wa$  (1.626 m)   Wt 172 lb (78 kg)   BMI 29.52 kg/m    Subjective:   Patient ID: [name], female    DOB: August 29, 1928, 86 y.o.   MRN: [id number]  HPI: [name] is a 86 y.o. female presenting on 04/28/2020 for Medical Management of Chronic Issues, Hypertension, and Hyperlipidemia   HPI Hypertension Patient is currently on amiodarone and nadolol, and their blood pressure today is 143/79. Patient denies any lightheadedness or dizziness. Patient denies headaches, blurred vision, chest pains, shortness of breath, or weakness. Denies any side effects from medication and is content with current medication.   Hyperlipidemia Patient is coming in for recheck of his hyperlipidemia. The patient is currently taking no medication and monitoring, sees cardiology as well. They deny any issues with myalgias or history of liver damage from it. They deny any focal numbness or weakness or chest pain.   Osteoporosis/osteopenia Fractures or history of fracture: History of wrist fracture Medication: Fosamax Duration of treatment: 5 years Last bone density scan: 06/18/2018 Last T score: -1.6 Is due for medication break  Relevant past medical, surgical, family and social history reviewed and updated as indicated. Interim medical history since our last visit reviewed. Allergies and medications reviewed and updated.  Review of Systems  Constitutional: Negative for chills and fever.  Eyes: Negative for visual disturbance.  Respiratory: Negative for chest tightness and shortness of breath.   Cardiovascular: Negative for chest pain and leg swelling.  Musculoskeletal: Negative for back pain and gait problem.  Skin: Negative for rash.  Neurological: Negative for light-headedness and headaches.  Psychiatric/Behavioral: Negative for agitation and behavioral problems.  All other systems reviewed and are negative.   Per HPI  unless specifically indicated above   Allergies as of 04/28/2020      Reactions   Iodinated Diagnostic Agents Hives, Itching, Rash   03/13/14: symptoms began within two hours of intrathecal injection (myelogram 03/11/14); on shoulders radiating down to stomach.  Relief with Benadryl.  Told patient she would need to pre-med with Benadryl in the future before receiving this contrast.  Brita Romp, RN   Naproxen Hives, Itching, Rash      Medication List       Accurate as of April 28, 2020 11:06 AM. If you have any questions, ask your nurse or doctor.        STOP taking these medications   alendronate 70 MG tablet Commonly known as: FOSAMAX Stopped by: Fransisca Kaufmann Anzal Bartnick, MD     TAKE these medications   acetaminophen 500 MG tablet Commonly known as: TYLENOL Take 1,000 mg by mouth every 6 (six) hours as needed for mild pain.   amiodarone 200 MG tablet Commonly known as: PACERONE Take 100 mg by mouth daily.   calcium-vitamin D 500-200 MG-UNIT tablet Commonly known as: OSCAL WITH D Take 1 tablet by mouth daily with breakfast.   cyanocobalamin 1000 MCG/ML injection Commonly known as: (VITAMIN B-12) Inject 1 mL (1,000 mcg total) into the muscle every 30 (thirty) days.   Eliquis 5 MG Tabs tablet Generic drug: apixaban TAKE ONE TABLET BY MOUTH TWICE DAILY   famotidine 20 MG tablet Commonly known as: PEPCID TAKE ONE (1) TABLET EACH DAY   latanoprost 0.005 % ophthalmic solution Commonly known as: XALATAN Place 1 drop into both eyes at bedtime.   multivitamin tablet Take 1 tablet by  mouth every morning.   nadolol 40 MG tablet Commonly known as: CORGARD Take 1.5 tablets (60 mg total) by mouth daily.   nitroGLYCERIN 0.4 MG SL tablet Commonly known as: NITROSTAT Place 1 tablet (0.4 mg total) under the tongue every 5 (five) minutes as needed for chest pain. Up to 3 doses. If no relief after 3 rd dose, proceed to the ED        Objective:   BP (!) 143/79   Pulse 60    Temp (!) 97 F (36.1 C)   Ht $R'5\' 4"'rV$  (1.626 m)   Wt 172 lb (78 kg)   BMI 29.52 kg/m   Wt Readings from Last 3 Encounters:  04/28/20 172 lb (78 kg)  02/05/20 177 lb (80.3 kg)  12/26/19 172 lb (78 kg)    Physical Exam Vitals and nursing note reviewed.  Constitutional:      General: She is not in acute distress.    Appearance: She is well-developed. She is not diaphoretic.  Eyes:     Conjunctiva/sclera: Conjunctivae normal.  Cardiovascular:     Rate and Rhythm: Normal rate and regular rhythm.     Heart sounds: Normal heart sounds. No murmur heard.   Pulmonary:     Effort: Pulmonary effort is normal. No respiratory distress.     Breath sounds: Normal breath sounds. No wheezing.  Musculoskeletal:        General: No tenderness. Normal range of motion.  Skin:    General: Skin is warm and dry.     Findings: No rash.  Neurological:     Mental Status: She is alert and oriented to person, place, and time.     Coordination: Coordination normal.  Psychiatric:        Behavior: Behavior normal.       Assessment & Plan:   Problem List Items Addressed This Visit      Cardiovascular and Mediastinum   Essential hypertension, benign (Chronic)     Other   Hyperlipidemia (Chronic)   Relevant Orders   Lipid panel    Other Visit Diagnoses    Other fatigue    -  Primary   Relevant Orders   CBC with Differential/Platelet   CMP14+EGFR   Thyroid Panel With TSH   Brain natriuretic peptide      Continue current medication except will stop Fosamax and she needs a break, as she has been on it quite a few years. Follow up plan: Return in about 6 months (around 10/26/2020), or if symptoms worsen or fail to improve, for Hypertension and hyperlipidemia recheck.  Counseling provided for all of the vaccine components Orders Placed This Encounter  Procedures  . CBC with Differential/Platelet  . CMP14+EGFR  . Thyroid Panel With TSH  . Brain natriuretic peptide  . Lipid panel     Caryl Pina, MD [facility] 04/28/2020, 11:06 AM

## 2020-04-29 LAB — CMP14+EGFR
ALT: 21 IU/L (ref 0–32)
AST: 25 IU/L (ref 0–40)
Albumin/Globulin Ratio: 2.1 (ref 1.2–2.2)
Albumin: 4.2 g/dL (ref 3.6–4.6)
Alkaline Phosphatase: 66 IU/L (ref 44–121)
BUN/Creatinine Ratio: 12 (ref 12–28)
BUN: 10 mg/dL (ref 8–27)
Bilirubin Total: 0.7 mg/dL (ref 0.0–1.2)
CO2: 23 mmol/L (ref 20–29)
Calcium: 9.5 mg/dL (ref 8.7–10.3)
Chloride: 102 mmol/L (ref 96–106)
Creatinine, Ser: 0.86 mg/dL (ref 0.57–1.00)
GFR calc Af Amer: 70 mL/min/{1.73_m2} (ref >59)
GFR calc non Af Amer: 61 mL/min/{1.73_m2} (ref >59)
Globulin, Total: 2 g/dL (ref 1.5–4.5)
Glucose: 100 mg/dL — ABNORMAL HIGH (ref 65–99)
Potassium: 4.4 mmol/L (ref 3.5–5.2)
Sodium: 140 mmol/L (ref 134–144)
Total Protein: 6.2 g/dL (ref 6.0–8.5)

## 2020-04-29 LAB — CBC WITH DIFFERENTIAL/PLATELET
Basophils Absolute: 0 10*3/uL (ref 0.0–0.2)
Basos: 1 %
EOS (ABSOLUTE): 0.1 10*3/uL (ref 0.0–0.4)
Eos: 3 %
Hematocrit: 33 % — ABNORMAL LOW (ref 34.0–46.6)
Hemoglobin: 11.5 g/dL (ref 11.1–15.9)
Immature Grans (Abs): 0 10*3/uL (ref 0.0–0.1)
Immature Granulocytes: 0 %
Lymphocytes Absolute: 1.3 10*3/uL (ref 0.7–3.1)
Lymphs: 32 %
MCH: 33.9 pg — ABNORMAL HIGH (ref 26.6–33.0)
MCHC: 34.8 g/dL (ref 31.5–35.7)
MCV: 97 fL (ref 79–97)
Monocytes Absolute: 0.5 10*3/uL (ref 0.1–0.9)
Monocytes: 12 %
Neutrophils Absolute: 2.1 10*3/uL (ref 1.4–7.0)
Neutrophils: 52 %
Platelets: 137 10*3/uL — ABNORMAL LOW (ref 150–450)
RBC: 3.39 x10E6/uL — ABNORMAL LOW (ref 3.77–5.28)
RDW: 13 % (ref 11.7–15.4)
WBC: 4 10*3/uL (ref 3.4–10.8)

## 2020-04-29 LAB — LIPID PANEL
Chol/HDL Ratio: 3.2 ratio (ref 0.0–4.4)
Cholesterol, Total: 173 mg/dL (ref 100–199)
HDL: 54 mg/dL (ref >39)
LDL Chol Calc (NIH): 108 mg/dL — ABNORMAL HIGH (ref 0–99)
Triglycerides: 55 mg/dL (ref 0–149)
VLDL Cholesterol Cal: 11 mg/dL (ref 5–40)

## 2020-04-29 LAB — THYROID PANEL WITH TSH
Free Thyroxine Index: 3 (ref 1.2–4.9)
T3 Uptake Ratio: 28 % (ref 24–39)
T4, Total: 10.8 ug/dL (ref 4.5–12.0)
TSH: 2.05 u[IU]/mL (ref 0.450–4.500)

## 2020-04-29 LAB — BRAIN NATRIURETIC PEPTIDE: BNP: 327.1 pg/mL — ABNORMAL HIGH (ref 0.0–100.0)

## 2020-04-30 ENCOUNTER — Telehealth: Payer: Self-pay

## 2020-04-30 NOTE — Telephone Encounter (Signed)
Pt returned missed call regarding lab results. Reviewed results with pt per Dr Neldon Mc note. Pt voiced understanding.

## 2020-05-03 ENCOUNTER — Other Ambulatory Visit: Payer: Self-pay

## 2020-05-03 ENCOUNTER — Ambulatory Visit (INDEPENDENT_AMBULATORY_CARE_PROVIDER_SITE_OTHER): Payer: Medicare Other | Admitting: *Deleted

## 2020-05-03 DIAGNOSIS — E538 Deficiency of other specified B group vitamins: Secondary | ICD-10-CM | POA: Diagnosis not present

## 2020-05-03 NOTE — Progress Notes (Signed)
Pt given B12 injection IM right deltoid and tolerated well. 

## 2020-05-17 ENCOUNTER — Telehealth: Payer: Medicare Other | Admitting: *Deleted

## 2020-05-17 ENCOUNTER — Telehealth: Payer: Self-pay | Admitting: *Deleted

## 2020-05-17 NOTE — Telephone Encounter (Signed)
  Chronic Care Management   Outreach Note  05/17/2020 Name: [name] MRN: [id number] DOB: 01-23-1929  Referred by: Dettinger, Fransisca Kaufmann, MD Reason for referral : Chronic Care Management (RN Follow up)   An unsuccessful follow-up Telephone Visit was attempted today. The patient was referred to the case management team for assistance with care management and care coordination.   Clinical Goals: . Over the next 10 days, patient will be contacted by a Care Guide to reschedule their Initial CCM Visit . Over the next 30 days, patient will have an Initial CCM Visit with a member of the embedded CCM team to discuss self-management of their chronic medical conditions  Interventions and Plan . Chart reviewed in preparation telephone call . Collaboration with other care team members as needed . Unsuccessful outreach to patient  . A HIPAA compliant phone message was left for the patient providing contact information and requesting a return call.  . Request sent to care guides to reach out and reschedule patient's follow-up   [name], BSN, RN-BC [place] [facility] [place] Management Direct Dial: [phone]

## 2020-05-19 ENCOUNTER — Other Ambulatory Visit: Payer: Self-pay | Admitting: Family Medicine

## 2020-05-20 NOTE — Telephone Encounter (Signed)
[name] Called spoke with patient she is going out of state and will not be back until the end of Feb. Rescheduled for f/u call 08/09/2020

## 2020-05-20 NOTE — Telephone Encounter (Signed)
R/s

## 2020-05-28 ENCOUNTER — Telehealth: Payer: Self-pay

## 2020-05-28 MED ORDER — APIXABAN 5 MG PO TABS
5.0000 mg | ORAL_TABLET | Freq: Two times a day (BID) | ORAL | 0 refills | Status: DC
Start: 2020-05-28 — End: 2020-05-31

## 2020-05-28 NOTE — Telephone Encounter (Signed)
Refill sent.

## 2020-05-28 NOTE — Telephone Encounter (Signed)
  Prescription Request  05/28/2020  What is the name of the medication or equipment? Eloquise  Have you contacted your pharmacy to request a refill? (if applicable) yes  Which pharmacy would you like this sent to? [facility]   Patient notified that their request is being sent to the clinical staff for review and that they should receive a response within 2 business days.   Dettinger's pt.  Pt is leaving to go out of town next Thursday & she will be out of her meds. Please send in meds & call her when ready.

## 2020-05-31 ENCOUNTER — Other Ambulatory Visit: Payer: Self-pay | Admitting: Family Medicine

## 2020-05-31 MED ORDER — APIXABAN 5 MG PO TABS
5.0000 mg | ORAL_TABLET | Freq: Two times a day (BID) | ORAL | 0 refills | Status: DC
Start: 2020-05-31 — End: 2020-09-10

## 2020-05-31 MED ORDER — CYANOCOBALAMIN 1000 MCG/ML IJ SOLN
1000.0000 ug | INTRAMUSCULAR | 1 refills | Status: DC
Start: 2020-05-31 — End: 2022-04-10

## 2020-05-31 NOTE — Telephone Encounter (Signed)
Patient states that Dr. Warrick Parisian sent her in 2 bottles of her vit D when she was out of town before.  Please advise - medication pending and change if okay to send in two at at time

## 2020-05-31 NOTE — Telephone Encounter (Signed)
Pt will be in [place] for two months, she will be in for her B12 shot this Wednesday 12/22 and asked to be able to get two to take with her along with an extra month of eliquis

## 2020-06-02 ENCOUNTER — Other Ambulatory Visit: Payer: Self-pay

## 2020-06-02 ENCOUNTER — Ambulatory Visit: Payer: Medicare Other

## 2020-06-02 ENCOUNTER — Ambulatory Visit (INDEPENDENT_AMBULATORY_CARE_PROVIDER_SITE_OTHER): Payer: Medicare Other

## 2020-06-02 DIAGNOSIS — E538 Deficiency of other specified B group vitamins: Secondary | ICD-10-CM | POA: Diagnosis not present

## 2020-06-02 NOTE — Progress Notes (Signed)
B12 injection given in left deltoid today without difficulty. Offered to make next months appt but pt will be in [facility] for the next two months visiting family. Pt will call when she returns to restart B12 injections.

## 2020-07-07 DIAGNOSIS — D518 Other vitamin B12 deficiency anemias: Secondary | ICD-10-CM | POA: Diagnosis not present

## 2020-07-29 ENCOUNTER — Ambulatory Visit (INDEPENDENT_AMBULATORY_CARE_PROVIDER_SITE_OTHER): Payer: Medicare Other

## 2020-07-29 DIAGNOSIS — I480 Paroxysmal atrial fibrillation: Secondary | ICD-10-CM | POA: Diagnosis not present

## 2020-07-29 LAB — CUP PACEART REMOTE DEVICE CHECK
Battery Remaining Longevity: 28 mo
Battery Remaining Percentage: 29 %
Battery Voltage: 2.81 V
Brady Statistic AP VP Percent: 97 %
Brady Statistic AP VS Percent: 1 %
Brady Statistic AS VP Percent: 2.3 %
Brady Statistic AS VS Percent: 1 %
Brady Statistic RA Percent Paced: 97 %
Brady Statistic RV Percent Paced: 99 %
Date Time Interrogation Session: 20220216162257
Implantable Lead Implant Date: 20031028
Implantable Lead Implant Date: 20031028
Implantable Lead Location: 753859
Implantable Lead Location: 753860
Implantable Pulse Generator Implant Date: 20140107
Lead Channel Impedance Value: 380 Ohm
Lead Channel Impedance Value: 480 Ohm
Lead Channel Pacing Threshold Amplitude: 1 V
Lead Channel Pacing Threshold Amplitude: 1.25 V
Lead Channel Pacing Threshold Pulse Width: 0.5 ms
Lead Channel Pacing Threshold Pulse Width: 0.5 ms
Lead Channel Sensing Intrinsic Amplitude: 0.2 mV
Lead Channel Sensing Intrinsic Amplitude: 9.9 mV
Lead Channel Setting Pacing Amplitude: 2 V
Lead Channel Setting Pacing Amplitude: 2.5 V
Lead Channel Setting Pacing Pulse Width: 0.5 ms
Lead Channel Setting Sensing Sensitivity: 2 mV
Pulse Gen Model: 2210
Pulse Gen Serial Number: 7438567

## 2020-08-04 NOTE — Progress Notes (Signed)
Remote pacemaker transmission.   

## 2020-08-09 ENCOUNTER — Telehealth: Payer: Self-pay | Admitting: *Deleted

## 2020-08-09 ENCOUNTER — Telehealth: Payer: Medicare Other | Admitting: *Deleted

## 2020-08-09 NOTE — Telephone Encounter (Signed)
  Chronic Care Management   Outreach Note  08/09/2020 Name: [name] MRN: [id number] DOB: 12-28-1928  Referred by: Dettinger, Fransisca Kaufmann, MD Reason for referral : Chronic Care Management (Unsuccessful Outreach)   A third unsuccessful Follow-up Telephone Visit was attempted today. The patient was referred to the case management team for assistance with care management and care coordination.   Interventions and Plan . Chart reviewed in preparation for telephone visit . Collaboration with other care team members as needed . A HIPAA compliant phone message was left for the patient providing contact information and requesting a return call.    [name], BSN, RN-BC Embedded Chronic Care Manager [facility] [facility] Direct Dial: [phone]

## 2020-08-11 ENCOUNTER — Ambulatory Visit: Payer: Medicare Other | Admitting: Cardiology

## 2020-08-11 ENCOUNTER — Encounter: Payer: Self-pay | Admitting: Cardiology

## 2020-08-11 VITALS — BP 132/64 | HR 59 | Ht 64.0 in | Wt 168.0 lb

## 2020-08-11 DIAGNOSIS — I10 Essential (primary) hypertension: Secondary | ICD-10-CM | POA: Diagnosis not present

## 2020-08-11 DIAGNOSIS — I495 Sick sinus syndrome: Secondary | ICD-10-CM

## 2020-08-11 DIAGNOSIS — I480 Paroxysmal atrial fibrillation: Secondary | ICD-10-CM

## 2020-08-11 NOTE — Progress Notes (Signed)
Cardiology Office Note  Date: 08/11/2020   ID: [name], DOB [birth year], MRN [id number]  PCP:  [name], [name] Kaufmann, MD  Cardiologist:  [name] Lesches, MD Electrophysiologist:  [name] Grayer, MD   Chief Complaint  Patient presents with  . Cardiac follow-up    History of Present Illness: [name] is an 87 y.o. female last seen in August 2021.  She is here for a routine visit.  She spent 2 months down in [place] with family, has been back for the last few weeks.  Reports intermittent episodes of shortness of breath and fatigue, possibly related to paroxysmal atrial fibrillation.  States that she has been compliant with her medications, no obvious bleeding problems on Eliquis.  I reviewed her most recent lab work from November 2021 as noted below.  She sees Dr. Rayann [name], St. Jude pacemaker in place.  Recent device interrogation showed normal function with 8 episodes of brief atrial arrhythmia lasting less than 1 minute each.  I went over her medications, no changes made to current cardiac regimen.  Past Medical History:  Diagnosis Date  . Arthritis   . Atrial flutter ([place])    s/p RFA 2003  . Breast cancer ([place])    1980's - lumpectomy only  . Colon polyps   . Diverticulosis   . DVT, lower extremity ([place]) 1998 and 1999   Bilateral  . Essential hypertension   . GERD (gastroesophageal reflux disease)   . Glaucoma   . IBS (irritable bowel syndrome)   . Mixed hyperlipidemia   . Osteoporosis   . Persistent atrial fibrillation ([place])   . Pneumonia 2011  . Sick sinus syndrome ([facility])    PPM ([facility])  . Urinary tract infection   . Varicose veins     Past Surgical History:  Procedure Laterality Date  . BREAST LUMPECTOMY  1980's   Right breast   . CARDIAC CATHETERIZATION  1999   Normal coronaries  . Cataracts Bilateral   . CHOLECYSTECTOMY    . COLONOSCOPY W/ BIOPSIES AND POLYPECTOMY  03/2003, 05/2008, 02/20/2011   severe diverticulosis, tubulovillous adenoma  polyp, internal and external hemorrhoids 2012: severe diverticulosis, 4 mm polyp, hemorrhoids  . CYST REMOVAL TRUNK     under breast = on belly   . DILATION AND CURETTAGE OF UTERUS    . EYE SURGERY     cataracts - bilateral   . Fractured wrist Left 2013   repair  . INSERT / REPLACE / REMOVE PACEMAKER    . KNEE ARTHROSCOPY WITH MEDIAL MENISECTOMY Right 10/21/2014   Procedure: RIGHT KNEE ARTHROSCOPY WITH MEDIAL MENISECTOM,lateral menisectomy,synovectomy suprapatellar pouch;  Surgeon: [name] Maudlin, MD;  Location: [facility];  Service: Orthopedics;  Laterality: Right;  . PACEMAKER GENERATOR CHANGE  06/18/12   [facility] [employer] DR RF, Dr [name]  . PACEMAKER INSERTION  2003  . PERMANENT PACEMAKER GENERATOR CHANGE N/A 06/18/2012   Procedure: PERMANENT PACEMAKER GENERATOR CHANGE;  Surgeon: [name] Grayer, MD;  Location: [facility] CATH LAB;  Service: Cardiovascular;  Laterality: N/A;  . TOTAL KNEE ARTHROPLASTY  12/20/2011   Procedure: TOTAL KNEE ARTHROPLASTY;  Surgeon: [name] Bastos, MD;  Location: [facility];  Service: Orthopedics;  Laterality: Left;  . TOTAL KNEE ARTHROPLASTY Right 02/10/2015   Procedure: TOTAL RIGHT KNEE ARTHROPLASTY;  Surgeon: [name] Maudlin, MD;  Location: [facility];  Service: Orthopedics;  Laterality: Right;    Current Outpatient Medications  Medication Sig Dispense Refill  . acetaminophen (TYLENOL) 500 MG tablet Take 1,000 mg by mouth every 6 (  six) hours as needed for mild pain.    Marland Kitchen amiodarone (PACERONE) 200 MG tablet Take 100 mg by mouth daily.    Marland Kitchen apixaban (ELIQUIS) 5 MG TABS tablet Take 1 tablet (5 mg total) by mouth 2 (two) times daily. 180 tablet 0  . calcium-vitamin D (OSCAL WITH D) 500-200 MG-UNIT tablet Take 1 tablet by mouth daily with breakfast.     . cyanocobalamin (,VITAMIN B-12,) 1000 MCG/ML injection Inject 1 mL (1,000 mcg total) into the muscle every 30 (thirty) days. 3 mL 1  . famotidine (PEPCID) 20 MG tablet TAKE ONE (1) TABLET EACH DAY 30 tablet 11  . latanoprost (XALATAN) 0.005 %  ophthalmic solution Place 1 drop into both eyes at bedtime.    . Multiple Vitamin (MULTIVITAMIN) tablet Take 1 tablet by mouth every morning.    . nadolol (CORGARD) 40 MG tablet Take 1.5 tablets (60 mg total) by mouth daily. 135 tablet 3  . nitroGLYCERIN (NITROSTAT) 0.4 MG SL tablet Place 1 tablet (0.4 mg total) under the tongue every 5 (five) minutes as needed for chest pain. Up to 3 doses. If no relief after 3 rd dose, proceed to the ED 75 tablet 1   Current Facility-Administered Medications  Medication Dose Route Frequency Provider Last Rate Last Admin  . cyanocobalamin ((VITAMIN B-12)) injection 1,000 mcg  1,000 mcg Intramuscular Q30 days [name], [name] Kaufmann, MD   1,000 mcg at 06/02/20 1521   Facility-Administered Medications Ordered in Other Visits  Medication Dose Route Frequency Provider Last Rate Last Admin  . bupivacaine liposome (EXPAREL) 1.3 % injection 266 mg  20 mL Infiltration Once [name], [name], PA-C       Allergies:  Iodinated diagnostic agents and Naproxen   ROS: No syncope.  Uses a cane and walker intermittently.  Physical Exam: VS:  BP 132/64   Pulse (!) 59   Ht 5\' 4"  (1.626 m)   Wt 168 lb (76.2 kg)   SpO2 99%   BMI 28.84 kg/m , BMI Body mass index is 28.84 kg/m.  Wt Readings from Last 3 Encounters:  08/11/20 168 lb (76.2 kg)  04/28/20 172 lb (78 kg)  02/05/20 177 lb (80.3 kg)    General: Elderly woman, appears comfortable at rest. HEENT: Conjunctiva and lids normal, wearing a mask. Neck: Supple, no elevated JVP or carotid bruits, no thyromegaly. Lungs: Clear to auscultation, nonlabored breathing at rest. Cardiac: Regular rate and rhythm, no S3, 2/6 systolic murmur, no pericardial rub. Extremities: Mild ankle edema.  ECG:  An ECG dated 10/17/2019 was personally reviewed today and demonstrated:  Dual-chamber pacing.  Recent Labwork: 04/28/2020: ALT 21; AST 25; BNP 327.1; BUN 10; Creatinine, Ser 0.86; Hemoglobin 11.5; Platelets 137; Potassium 4.4; Sodium  140; TSH 2.050     Component Value Date/Time   CHOL 173 04/28/2020 1120   TRIG 55 04/28/2020 1120   HDL 54 04/28/2020 1120   CHOLHDL 3.2 04/28/2020 1120   LDLCALC 108 (H) 04/28/2020 1120    Other Studies Reviewed Today:  Lexiscan Myoview 08/01/2012: No perfusion evidence of scar or ischemia with LVEF 63%.  Assessment and Plan:  1.  Paroxysmal atrial fibrillation with CHA2DS2-VASc score of 4.  She does have relatively brief breakthrough episodes based on device interrogation.  Plan to continue low-dose amiodarone for rhythm suppression, also Eliquis for stroke prophylaxis.  I did review her interval lab work showing normal LFTs and TSH.  2.  Sick sinus syndrome with St. Jude pacemaker in place.  She continues to follow with  Dr. Rayann [name].  3.  Essential hypertension, systolic is in the 599H today.  No changes to current regimen.  Medication Adjustments/Labs and Tests Ordered: Current medicines are reviewed at length with the patient today.  Concerns regarding medicines are outlined above.   Tests Ordered: No orders of the defined types were placed in this encounter.   Medication Changes: No orders of the defined types were placed in this encounter.   Disposition:  Follow up 6 months in the [place] office.  Signed, Satira Sark, MD, [facility] 08/11/2020 11:36 AM    [facility] [place], [place],  [address] Phone: [phone]; Fax: [phone]

## 2020-08-11 NOTE — Patient Instructions (Signed)

## 2020-08-16 NOTE — Telephone Encounter (Signed)
[name] since this was the 3rd unsuccessful outreach this pt needs to be closed correct?

## 2020-08-17 NOTE — Telephone Encounter (Signed)
It should be closed since there have been 3 attempts. I wasn't sure if I could go ahead and close it or if I needed to send it back to you.

## 2020-09-09 ENCOUNTER — Other Ambulatory Visit: Payer: Self-pay | Admitting: Cardiology

## 2020-09-09 ENCOUNTER — Other Ambulatory Visit: Payer: Self-pay | Admitting: Family Medicine

## 2020-10-06 ENCOUNTER — Other Ambulatory Visit: Payer: Self-pay

## 2020-10-06 ENCOUNTER — Ambulatory Visit (INDEPENDENT_AMBULATORY_CARE_PROVIDER_SITE_OTHER): Payer: Medicare Other

## 2020-10-06 DIAGNOSIS — E538 Deficiency of other specified B group vitamins: Secondary | ICD-10-CM

## 2020-10-06 NOTE — Progress Notes (Signed)
Cyanocobalamin injection given to right deltoid.  Patient tolerated well. 

## 2020-10-12 DIAGNOSIS — M25571 Pain in right ankle and joints of right foot: Secondary | ICD-10-CM | POA: Diagnosis not present

## 2020-10-12 DIAGNOSIS — M79671 Pain in right foot: Secondary | ICD-10-CM | POA: Diagnosis not present

## 2020-10-15 ENCOUNTER — Encounter: Payer: Self-pay | Admitting: Internal Medicine

## 2020-10-15 ENCOUNTER — Ambulatory Visit: Payer: Medicare Other | Admitting: Internal Medicine

## 2020-10-15 VITALS — BP 128/74 | HR 60 | Ht 64.0 in | Wt 167.2 lb

## 2020-10-15 DIAGNOSIS — I495 Sick sinus syndrome: Secondary | ICD-10-CM | POA: Diagnosis not present

## 2020-10-15 DIAGNOSIS — I10 Essential (primary) hypertension: Secondary | ICD-10-CM

## 2020-10-15 DIAGNOSIS — D6869 Other thrombophilia: Secondary | ICD-10-CM | POA: Diagnosis not present

## 2020-10-15 DIAGNOSIS — I4819 Other persistent atrial fibrillation: Secondary | ICD-10-CM | POA: Diagnosis not present

## 2020-10-15 LAB — CUP PACEART INCLINIC DEVICE CHECK
Battery Remaining Longevity: 22 mo
Battery Voltage: 2.8 V
Brady Statistic RA Percent Paced: 97 %
Brady Statistic RV Percent Paced: 99 %
Date Time Interrogation Session: 20220506095452
Implantable Lead Implant Date: 20031028
Implantable Lead Implant Date: 20031028
Implantable Lead Location: 753859
Implantable Lead Location: 753860
Implantable Pulse Generator Implant Date: 20140107
Lead Channel Impedance Value: 362.5 Ohm
Lead Channel Impedance Value: 462.5 Ohm
Lead Channel Pacing Threshold Amplitude: 0.5 V
Lead Channel Pacing Threshold Amplitude: 0.75 V
Lead Channel Pacing Threshold Pulse Width: 0.5 ms
Lead Channel Pacing Threshold Pulse Width: 0.5 ms
Lead Channel Sensing Intrinsic Amplitude: 0.2 mV
Lead Channel Sensing Intrinsic Amplitude: 11.2 mV
Lead Channel Setting Pacing Amplitude: 2 V
Lead Channel Setting Pacing Amplitude: 2.5 V
Lead Channel Setting Pacing Pulse Width: 0.5 ms
Lead Channel Setting Sensing Sensitivity: 2 mV
Pulse Gen Model: 2210
Pulse Gen Serial Number: 7438567

## 2020-10-15 NOTE — Addendum Note (Signed)
Addended by: Laurine Blazer on: 10/15/2020 02:56 PM   Modules accepted: Orders

## 2020-10-15 NOTE — Progress Notes (Signed)
PCP: Dettinger, Fransisca Kaufmann, MD Primary Cardiologist: Dr Domenic Polite Primary EP:  Dr Rayann Heman  [name] is a 87 y.o. female who presents today for routine electrophysiology followup.  Since last being seen in our clinic, the patient reports doing very well.  She has stable SOB and edema.  Today, she denies symptoms of palpitations, chest pain, dizziness, presyncope, or syncope.  The patient is otherwise without complaint today.   Past Medical History:  Diagnosis Date  . Arthritis   . Atrial flutter ([place])    s/p RFA 2003  . Breast cancer ([place])    1980's - lumpectomy only  . Colon polyps   . Diverticulosis   . DVT, lower extremity ([place]) 1998 and 1999   Bilateral  . Essential hypertension   . GERD (gastroesophageal reflux disease)   . Glaucoma   . IBS (irritable bowel syndrome)   . Mixed hyperlipidemia   . Osteoporosis   . Persistent atrial fibrillation ([place])   . Pneumonia 2011  . Sick sinus syndrome ([facility])    PPM ([facility])  . Urinary tract infection   . Varicose veins    Past Surgical History:  Procedure Laterality Date  . BREAST LUMPECTOMY  1980's   Right breast   . CARDIAC CATHETERIZATION  1999   Normal coronaries  . Cataracts Bilateral   . CHOLECYSTECTOMY    . COLONOSCOPY W/ BIOPSIES AND POLYPECTOMY  03/2003, 05/2008, 02/20/2011   severe diverticulosis, tubulovillous adenoma polyp, internal and external hemorrhoids 2012: severe diverticulosis, 4 mm polyp, hemorrhoids  . CYST REMOVAL TRUNK     under breast = on belly   . DILATION AND CURETTAGE OF UTERUS    . EYE SURGERY     cataracts - bilateral   . Fractured wrist Left 2013   repair  . INSERT / REPLACE / REMOVE PACEMAKER    . KNEE ARTHROSCOPY WITH MEDIAL MENISECTOMY Right 10/21/2014   Procedure: RIGHT KNEE ARTHROSCOPY WITH MEDIAL MENISECTOM,lateral menisectomy,synovectomy suprapatellar pouch;  Surgeon: Latanya Maudlin, MD;  [facility];  Service: Orthopedics;  Laterality: Right;  . PACEMAKER GENERATOR CHANGE   06/18/12   [facility] DR RF, Dr Rayann Heman  . PACEMAKER INSERTION  2003  . PERMANENT PACEMAKER GENERATOR CHANGE N/A 06/18/2012   Procedure: PERMANENT PACEMAKER GENERATOR CHANGE;  Surgeon: Thompson Grayer, MD;  Location[facility] CATH LAB;  Service: Cardiovascular;  Laterality: N/A;  . TOTAL KNEE ARTHROPLASTY  12/20/2011   Procedure: TOTAL KNEE ARTHROPLASTY;  Surgeon: Tobi Bastos, MD;  [facility];  Service: Orthopedics;  Laterality: Left;  . TOTAL KNEE ARTHROPLASTY Right 02/10/2015   Procedure: TOTAL RIGHT KNEE ARTHROPLASTY;  Surgeon: Latanya Maudlin, MD;  [facility];  Service: Orthopedics;  Laterality: Right;    ROS- all systems are reviewed and negative except as per HPI above  Current Outpatient Medications  Medication Sig Dispense Refill  . acetaminophen (TYLENOL) 500 MG tablet Take 1,000 mg by mouth every 6 (six) hours as needed for mild pain.    Marland Kitchen amiodarone (PACERONE) 200 MG tablet Take 100 mg by mouth daily.    . calcium-vitamin D (OSCAL WITH D) 500-200 MG-UNIT tablet Take 1 tablet by mouth daily with breakfast.     . cyanocobalamin (,VITAMIN B-12,) 1000 MCG/ML injection Inject 1 mL (1,000 mcg total) into the muscle every 30 (thirty) days. 3 mL 1  . ELIQUIS 5 MG TABS tablet TAKE ONE TABLET BY MOUTH TWICE DAILY 180 tablet 0  . famotidine (PEPCID) 20 MG tablet TAKE ONE (1)  TABLET EACH DAY 30 tablet 11  . latanoprost (XALATAN) 0.005 % ophthalmic solution Place 1 drop into both eyes at bedtime.    . Multiple Vitamin (MULTIVITAMIN) tablet Take 1 tablet by mouth every morning.    . nadolol (CORGARD) 40 MG tablet Take 1.5 tablets (60 mg total) by mouth daily. 135 tablet 3  . nitroGLYCERIN (NITROSTAT) 0.4 MG SL tablet DISSOLVE 1 TAB UNDER TOUNGE FOR CHEST PAIN. MAY REPEAT EVERY 5 MINUTES FOR 3 DOSES. IF NO RELIEF CALL 911 OR GO TO ER 25 tablet 3  . pantoprazole (PROTONIX) 40 MG tablet Take 1 tablet by mouth daily.     Current Facility-Administered Medications  Medication Dose Route Frequency  Provider Last Rate Last Admin  . cyanocobalamin ((VITAMIN B-12)) injection 1,000 mcg  1,000 mcg Intramuscular Q30 days Dettinger, Fransisca Kaufmann, MD   1,000 mcg at 10/06/20 1046   Facility-Administered Medications Ordered in Other Visits  Medication Dose Route Frequency Provider Last Rate Last Admin  . bupivacaine liposome (EXPAREL) 1.3 % injection 266 mg  20 mL Infiltration Once [place], [employer], PA-C        Physical Exam: Vitals:   10/15/20 0937  BP: 128/74  Pulse: 60  SpO2: 99%  Weight: 167 lb 3.2 oz (75.8 kg)  Height: 5\' 4"  (1.626 m)    GEN- The patient is elderly appearing, alert and oriented x 3 today.   Head- normocephalic, atraumatic Eyes-  Sclera clear, conjunctiva pink Ears- hearing intact Oropharynx- clear Lungs-  normal work of breathing Chest- pacemaker pocket is well healed Heart- Regular rate and rhythm  GI- soft  Extremities- no clubbing, cyanosis, or edema  Pacemaker interrogation- reviewed in detail today,  See PACEART report  ekg tracing ordered today is personally reviewed and shows AV paced rhythm  Assessment and Plan:  1. Symptomatic sinus bradycardia and second degree heart block Normal pacemaker function See Pace Art report No changes today she is device dependant today  2. Persistent afib She is on amiodarone AF is well controlled (<1% burden by PPM) We will follow her closely on amiodarone to avoid toxicity She prefers that PCP follow LFTs, TFTs, PFTs chads2vasc score is 4.  She is on eliquis  3. HTN Stable No change required today   Risks, benefits and potential toxicities for medications prescribed and/or refilled reviewed with patient today.   Return in a year  Thompson Grayer MD, [facility] 10/15/2020 9:52 AM

## 2020-10-15 NOTE — Patient Instructions (Addendum)
Medication Instructions:  Continue all current medications.  Labwork: none  Testing/Procedures: none  Follow-Up: 1 year - Dr.  Allred   Any Other Special Instructions Will Be Listed Below (If Applicable).   If you need a refill on your cardiac medications before your next appointment, please call your pharmacy.  

## 2020-10-22 ENCOUNTER — Encounter: Payer: Self-pay | Admitting: Internal Medicine

## 2020-10-22 ENCOUNTER — Ambulatory Visit: Payer: Medicare Other | Admitting: Internal Medicine

## 2020-10-22 VITALS — BP 132/78 | HR 64 | Ht 62.75 in | Wt 166.0 lb

## 2020-10-22 DIAGNOSIS — K5730 Diverticulosis of large intestine without perforation or abscess without bleeding: Secondary | ICD-10-CM

## 2020-10-22 DIAGNOSIS — R059 Cough, unspecified: Secondary | ICD-10-CM | POA: Diagnosis not present

## 2020-10-22 DIAGNOSIS — R0789 Other chest pain: Secondary | ICD-10-CM | POA: Diagnosis not present

## 2020-10-22 DIAGNOSIS — K5909 Other constipation: Secondary | ICD-10-CM

## 2020-10-22 DIAGNOSIS — R1310 Dysphagia, unspecified: Secondary | ICD-10-CM | POA: Diagnosis not present

## 2020-10-22 NOTE — Progress Notes (Signed)
[name] 87 y.o. 1929/05/24 [id number]  Assessment & Plan:   Encounter Diagnoses  Name Primary?  Marland Kitchen Dysphagia, unspecified type Yes  . Atypical chest pain   . Cough   . Chronic constipation   . Diverticulosis of colon - severe sigmoid     Evaluate dysphagia and atypical chest pain with barium swallow and tablet study.  The symptoms are very similar to what she had in 2017 and her EGD was normal.  Should we need to do an EGD we can.  We need to hold Eliquis.  Cough could be related to reflux.  Medication adjustments additional testing etc. to be determined after the above.  Note she has significant kyphoscoliosis.  Previous CT scans of the chest did not mention hiatal hernia but she is someone who I think is at increased risk for a hiatal hernia that could be large including a paraesophageal hernia.  However that was also not seen on EGD.  Regarding chronic constipation I think she can drop a stool softener and we will add MiraLAX.  She seems to have decent anorectal function based upon functional rectal exam.  I suspect many of her problems are related to her severe diverticulosis.  I will arrange follow-up etc. pending review of the barium swallow.  I appreciate the opportunity to care for this patient. CC: Dettinger, Fransisca Kaufmann, MD   Subjective:   Chief Complaint: Dysphagia, chest pain, painful swallowing constipation  HPI This is an 87 year old white woman here with her daughter in law with complaints of dysphagia cough, odynophagia and atypical chest pain at times which has been an issue in the past and she had a negative EGD in 2017.  She also has chronic severe constipation issues.  Regarding the upper symptoms she has episodic issues where food seems to catch in the right side of her neck.  She has intermittent occasional chest pains that can be fleeting or severe and more persistent that seem to respond to taking several Tums.  She cannot identify clear food  triggers.  They may occur once a week or go weeks without.  She also feels like there is sand in her throat at times and she has to clear her throat and cough.  Has had some hoarseness issues as well.  She is maintained on pantoprazole 40 mg daily.  Wt Readings from Last 3 Encounters:  10/22/20 166 lb (75.3 kg)  10/15/20 167 lb 3.2 oz (75.8 kg)  08/11/20 168 lb (76.2 kg)     Regarding constipation this is a chronic issue as well she is currently on 2 tablespoons of Benefiber and stool softeners daily.  She has severe diverticulosis noted from previous colonoscopy last done in 2012.  It also showed a diminutive adenoma.  She is not having any rectal bleeding.  She reports 3-4 bowel movements a week but then she will have problems where she has urge to defecate and severe straining and may not produce a stool or significant stool on some days.  This is still bothering her though it is better with the Benefiber and stool softener her quality of life is not where she would like.  She does have a little bit of urge urinary incontinence and stress urinary incontinence.   Allergies  Allergen Reactions  . Iodinated Diagnostic Agents Hives, Itching and Rash    03/13/14: symptoms began within two hours of intrathecal injection (myelogram 03/11/14); on shoulders radiating down to stomach.  Relief with Benadryl.  Told patient  she would need to pre-med with Benadryl in the future before receiving this contrast.  Brita Romp, RN  . Naproxen Hives, Itching and Rash   Current Meds  Medication Sig  . acetaminophen (TYLENOL) 500 MG tablet Take 1,000 mg by mouth every 6 (six) hours as needed for mild pain.  Marland Kitchen amiodarone (PACERONE) 200 MG tablet Take 100 mg by mouth daily.  . calcium-vitamin D (OSCAL WITH D) 500-200 MG-UNIT tablet Take 1 tablet by mouth daily with breakfast.   . cyanocobalamin (,VITAMIN B-12,) 1000 MCG/ML injection Inject 1 mL (1,000 mcg total) into the muscle every 30 (thirty) days.  Marland Kitchen docusate  sodium (STOOL SOFTENER) 100 MG capsule Take 200 mg by mouth at bedtime.  Marland Kitchen ELIQUIS 5 MG TABS tablet TAKE ONE TABLET BY MOUTH TWICE DAILY  . latanoprost (XALATAN) 0.005 % ophthalmic solution Place 1 drop into both eyes at bedtime.  . melatonin 5 MG TABS Take 5 mg by mouth at bedtime as needed.  . Multiple Vitamin (MULTIVITAMIN) tablet Take 1 tablet by mouth every morning.  . nadolol (CORGARD) 40 MG tablet Take 1.5 tablets (60 mg total) by mouth daily.  . nitroGLYCERIN (NITROSTAT) 0.4 MG SL tablet DISSOLVE 1 TAB UNDER TOUNGE FOR CHEST PAIN. MAY REPEAT EVERY 5 MINUTES FOR 3 DOSES. IF NO RELIEF CALL 911 OR GO TO ER  . pantoprazole (PROTONIX) 40 MG tablet Take 1 tablet by mouth daily.  . Wheat Dextrin (BENEFIBER) POWD Take 1 Dose by mouth daily.   Current Facility-Administered Medications for the 10/22/20 encounter (Office Visit) with Gatha Mayer, MD  Medication  . cyanocobalamin ((VITAMIN B-12)) injection 1,000 mcg   Past Medical History:  Diagnosis Date  . Arthritis   . Atrial flutter ([place])    s/p RFA 2003  . Breast cancer ([place])    1980's - lumpectomy only  . Colon polyps   . Diverticulosis   . DVT, lower extremity ([place]) 1998 and 1999   Bilateral  . Essential hypertension   . Gallstones   . GERD (gastroesophageal reflux disease)   . Glaucoma   . IBS (irritable bowel syndrome)   . Mixed hyperlipidemia   . Osteoporosis   . Persistent atrial fibrillation ([place])   . Pneumonia 2011  . Sick sinus syndrome ([facility])    PPM ([facility])  . Urinary tract infection   . Varicose veins    Past Surgical History:  Procedure Laterality Date  . BREAST LUMPECTOMY  1980's   Right breast   . CARDIAC CATHETERIZATION  1999   Normal coronaries  . Cataracts Bilateral   . CHOLECYSTECTOMY    . COLONOSCOPY W/ BIOPSIES AND POLYPECTOMY  03/2003, 05/2008, 02/20/2011   severe diverticulosis, tubulovillous adenoma polyp, internal and external hemorrhoids 2012: severe diverticulosis, 4 mm polyp, hemorrhoids  .  CYST REMOVAL TRUNK     under breast = on belly   . DILATION AND CURETTAGE OF UTERUS    . EYE SURGERY     cataracts - bilateral   . Fractured wrist Left 2013   repair  . INSERT / REPLACE / REMOVE PACEMAKER    . KNEE ARTHROSCOPY WITH MEDIAL MENISECTOMY Right 10/21/2014   Procedure: RIGHT KNEE ARTHROSCOPY WITH MEDIAL MENISECTOM,lateral menisectomy,synovectomy suprapatellar pouch;  Surgeon: Latanya Maudlin, MD;  Location: [facility];  Service: Orthopedics;  Laterality: Right;  . PACEMAKER GENERATOR CHANGE  06/18/12   [facility] DR RF, Dr Rayann Heman  . PACEMAKER INSERTION  2003  . PERMANENT PACEMAKER GENERATOR CHANGE N/A 06/18/2012   Procedure: PERMANENT  PACEMAKER GENERATOR CHANGE;  Surgeon: Thompson Grayer, MD;  Location: [facility] CATH LAB;  Service: Cardiovascular;  Laterality: N/A;  . TOTAL KNEE ARTHROPLASTY  12/20/2011   Procedure: TOTAL KNEE ARTHROPLASTY;  Surgeon: Tobi Bastos, MD;  Location: [facility];  Service: Orthopedics;  Laterality: Left;  . TOTAL KNEE ARTHROPLASTY Right 02/10/2015   Procedure: TOTAL RIGHT KNEE ARTHROPLASTY;  Surgeon: Latanya Maudlin, MD;  Location: [facility];  Service: Orthopedics;  Laterality: Right;   Social History   Social History Narrative   Widowed  - 5 sons = 1 lives local    Retired.   Never smoker no alcohol   family history includes Alzheimer's disease in her brother, mother, and sister; Atrial fibrillation in her brother; Colon cancer in her brother, maternal grandfather, and sister; Dementia in her brother; Diabetes in her brother; Heart attack in her paternal grandmother; Heart disease in her brother and paternal grandmother; Parkinson's disease in her brother; Prostate cancer in her son and son; Stomach cancer in her maternal grandmother; Stroke in her mother; Throat cancer in her father.   Review of Systems She uses a walker to ambulate she has some insomnia and arthritis pains.  Fatigue and dyspnea at times.  Otherwise as per HPI.  Objective:   Physical Exam BP 132/78  (BP Location: Left Arm, Patient Position: Sitting, Cuff Size: Normal)   Pulse 64   Ht 5' 2.75" (1.594 m) Comment: height measured without shoes  Wt 166 lb (75.3 kg)   BMI 29.64 kg/m  Pleasant elderly white woman in no acute distress appearing well-nourished and well-developed Eyes are anicteric Chest notable for significant Kyphoscoliosis Heart sounds normal S1-S2 no rubs murmurs or gallops The lungs are clear Mildly tender abd but benign overall with bowel sounds present no organomegaly or mass or obvious hernia   Rectal: Patti Martinique, CMA present.   Anoderm inspection revealed as per photo    Anal wink was absent Digital exam revealed normal resting tone and voluntary squeeze. No mass or rectocele present. Simulated defecation with valsalva revealed appropriate abdominal contraction and descent.    She is alert and oriented x3 and has appropriate mood and affect

## 2020-10-22 NOTE — Patient Instructions (Signed)
Please try one dose of Miralax daily, coupon provided. Continue your benefiber but you may stop your stool softener.   You have been scheduled for a Barium Esophogram at [facility] on May 17th  At 10:00AM . Please arrive 15 minutes prior to your appointment for registration. Make certain not to have anything to eat or drink 3 hours prior to your test. If you need to reschedule for any reason, please contact radiology at [phone] to do so. __________________________________________________________________ A barium swallow is an examination that concentrates on views of the esophagus. This tends to be a double contrast exam (barium and two liquids which, when combined, create a gas to distend the wall of the oesophagus) or single contrast (non-ionic iodine based). The study is usually tailored to your symptoms so a good history is essential. Attention is paid during the study to the form, structure and configuration of the esophagus, looking for functional disorders (such as aspiration, dysphagia, achalasia, motility and reflux) EXAMINATION You may be asked to change into a gown, depending on the type of swallow being performed. A radiologist and radiographer will perform the procedure. The radiologist will advise you of the type of contrast selected for your procedure and direct you during the exam. You will be asked to stand, sit or lie in several different positions and to hold a small amount of fluid in your mouth before being asked to swallow while the imaging is performed .In some instances you may be asked to swallow barium coated marshmallows to assess the motility of a solid food bolus. The exam can be recorded as a digital or video fluoroscopy procedure. POST PROCEDURE It will take 1-2 days for the barium to pass through your system. To facilitate this, it is important, unless otherwise directed, to increase your fluids for the next 24-48hrs and to resume your normal diet.  This test  typically takes about 30 minutes to perform. __________________________________________________________________________________   I appreciate the opportunity to care for you. Silvano Rusk, MD, [facility]

## 2020-10-26 ENCOUNTER — Ambulatory Visit (HOSPITAL_COMMUNITY)
Admission: RE | Admit: 2020-10-26 | Discharge: 2020-10-26 | Disposition: A | Discharge status: Home / Self Care | Payer: Medicare Other | Source: Ambulatory Visit | Attending: Internal Medicine | Admitting: Internal Medicine

## 2020-10-26 ENCOUNTER — Other Ambulatory Visit: Payer: Self-pay

## 2020-10-26 DIAGNOSIS — R1310 Dysphagia, unspecified: Secondary | ICD-10-CM | POA: Insufficient documentation

## 2020-10-26 DIAGNOSIS — K219 Gastro-esophageal reflux disease without esophagitis: Secondary | ICD-10-CM | POA: Diagnosis not present

## 2020-11-17 ENCOUNTER — Encounter (HOSPITAL_COMMUNITY): Payer: Self-pay

## 2020-11-17 ENCOUNTER — Other Ambulatory Visit: Payer: Self-pay

## 2020-11-17 ENCOUNTER — Emergency Department (HOSPITAL_COMMUNITY): Payer: Medicare Other

## 2020-11-17 ENCOUNTER — Observation Stay (HOSPITAL_COMMUNITY)
Admission: EM | Admit: 2020-11-17 | Discharge: 2020-11-18 | Disposition: A | Discharge status: Home / Self Care | Payer: Medicare Other | Attending: Family Medicine | Admitting: Family Medicine

## 2020-11-17 DIAGNOSIS — Z79899 Other long term (current) drug therapy: Secondary | ICD-10-CM | POA: Diagnosis not present

## 2020-11-17 DIAGNOSIS — Z853 Personal history of malignant neoplasm of breast: Secondary | ICD-10-CM | POA: Insufficient documentation

## 2020-11-17 DIAGNOSIS — R079 Chest pain, unspecified: Secondary | ICD-10-CM | POA: Diagnosis not present

## 2020-11-17 DIAGNOSIS — K219 Gastro-esophageal reflux disease without esophagitis: Secondary | ICD-10-CM | POA: Diagnosis present

## 2020-11-17 DIAGNOSIS — R197 Diarrhea, unspecified: Secondary | ICD-10-CM | POA: Diagnosis not present

## 2020-11-17 DIAGNOSIS — Z7901 Long term (current) use of anticoagulants: Secondary | ICD-10-CM | POA: Insufficient documentation

## 2020-11-17 DIAGNOSIS — H409 Unspecified glaucoma: Secondary | ICD-10-CM | POA: Diagnosis present

## 2020-11-17 DIAGNOSIS — I10 Essential (primary) hypertension: Secondary | ICD-10-CM | POA: Diagnosis not present

## 2020-11-17 DIAGNOSIS — I480 Paroxysmal atrial fibrillation: Secondary | ICD-10-CM | POA: Insufficient documentation

## 2020-11-17 DIAGNOSIS — Z20822 Contact with and (suspected) exposure to covid-19: Secondary | ICD-10-CM | POA: Diagnosis not present

## 2020-11-17 DIAGNOSIS — D539 Nutritional anemia, unspecified: Secondary | ICD-10-CM | POA: Insufficient documentation

## 2020-11-17 DIAGNOSIS — I517 Cardiomegaly: Secondary | ICD-10-CM | POA: Diagnosis not present

## 2020-11-17 DIAGNOSIS — Z950 Presence of cardiac pacemaker: Secondary | ICD-10-CM | POA: Diagnosis not present

## 2020-11-17 DIAGNOSIS — Z96653 Presence of artificial knee joint, bilateral: Secondary | ICD-10-CM | POA: Diagnosis not present

## 2020-11-17 DIAGNOSIS — R0789 Other chest pain: Principal | ICD-10-CM | POA: Diagnosis present

## 2020-11-17 DIAGNOSIS — I272 Pulmonary hypertension, unspecified: Secondary | ICD-10-CM | POA: Diagnosis present

## 2020-11-17 DIAGNOSIS — R0602 Shortness of breath: Secondary | ICD-10-CM | POA: Diagnosis not present

## 2020-11-17 DIAGNOSIS — R0689 Other abnormalities of breathing: Secondary | ICD-10-CM | POA: Diagnosis not present

## 2020-11-17 DIAGNOSIS — J90 Pleural effusion, not elsewhere classified: Secondary | ICD-10-CM | POA: Diagnosis not present

## 2020-11-17 DIAGNOSIS — E785 Hyperlipidemia, unspecified: Secondary | ICD-10-CM | POA: Diagnosis present

## 2020-11-17 DIAGNOSIS — R531 Weakness: Secondary | ICD-10-CM | POA: Diagnosis not present

## 2020-11-17 LAB — HEPATIC FUNCTION PANEL
ALT: 19 U/L (ref 0–44)
AST: 23 U/L (ref 15–41)
Albumin: 3.7 g/dL (ref 3.5–5.0)
Alkaline Phosphatase: 58 U/L (ref 38–126)
Bilirubin, Direct: 0.2 mg/dL (ref 0.0–0.2)
Indirect Bilirubin: 0.6 mg/dL (ref 0.3–0.9)
Total Bilirubin: 0.8 mg/dL (ref 0.3–1.2)
Total Protein: 6.5 g/dL (ref 6.5–8.1)

## 2020-11-17 LAB — CBC
HCT: 34.6 % — ABNORMAL LOW (ref 36.0–46.0)
Hemoglobin: 11.2 g/dL — ABNORMAL LOW (ref 12.0–15.0)
MCH: 34.5 pg — ABNORMAL HIGH (ref 26.0–34.0)
MCHC: 32.4 g/dL (ref 30.0–36.0)
MCV: 106.5 fL — ABNORMAL HIGH (ref 80.0–100.0)
Platelets: 122 10*3/uL — ABNORMAL LOW (ref 150–400)
RBC: 3.25 MIL/uL — ABNORMAL LOW (ref 3.87–5.11)
RDW: 13.7 % (ref 11.5–15.5)
WBC: 4.1 10*3/uL (ref 4.0–10.5)
nRBC: 0 % (ref 0.0–0.2)

## 2020-11-17 LAB — BASIC METABOLIC PANEL
Anion gap: 5 (ref 5–15)
BUN: 11 mg/dL (ref 8–23)
CO2: 27 mmol/L (ref 22–32)
Calcium: 8.7 mg/dL — ABNORMAL LOW (ref 8.9–10.3)
Chloride: 104 mmol/L (ref 98–111)
Creatinine, Ser: 0.77 mg/dL (ref 0.44–1.00)
GFR, Estimated: >60 mL/min (ref >60)
Glucose, Bld: 97 mg/dL (ref 70–99)
Potassium: 4 mmol/L (ref 3.5–5.1)
Sodium: 136 mmol/L (ref 135–145)

## 2020-11-17 LAB — TROPONIN I (HIGH SENSITIVITY)
Troponin I (High Sensitivity): 10 ng/L (ref <18)
Troponin I (High Sensitivity): 12 ng/L

## 2020-11-17 LAB — LIPASE, BLOOD: Lipase: 33 U/L (ref 11–51)

## 2020-11-17 LAB — POC SARS CORONAVIRUS 2 AG -  ED: SARSCOV2ONAVIRUS 2 AG: NEGATIVE

## 2020-11-17 MED ORDER — CALCIUM CARBONATE-VITAMIN D 500-200 MG-UNIT PO TABS
1.0000 | ORAL_TABLET | Freq: Every day | ORAL | Status: DC
  Administered 2020-11-18: 1 via ORAL
  Filled 2020-11-17: qty 1

## 2020-11-17 MED ORDER — ACETAMINOPHEN 325 MG PO TABS: 650.0000 mg | ORAL_TABLET | Freq: Four times a day (QID) | ORAL | Status: DC | PRN

## 2020-11-17 MED ORDER — NITROGLYCERIN 0.4 MG SL SUBL: 0.4000 mg | SUBLINGUAL_TABLET | SUBLINGUAL | Status: DC | PRN

## 2020-11-17 MED ORDER — ACETAMINOPHEN 650 MG RE SUPP: 650.0000 mg | Freq: Four times a day (QID) | RECTAL | Status: DC | PRN

## 2020-11-17 MED ORDER — PANTOPRAZOLE SODIUM 40 MG PO TBEC
40.0000 mg | DELAYED_RELEASE_TABLET | Freq: Every day | ORAL | Status: DC
  Administered 2020-11-18: 40 mg via ORAL
  Filled 2020-11-17: qty 1

## 2020-11-17 MED ORDER — NADOLOL 40 MG PO TABS
60.0000 mg | ORAL_TABLET | Freq: Every day | ORAL | Status: DC
  Administered 2020-11-18: 60 mg via ORAL
  Filled 2020-11-17 (×2): qty 1

## 2020-11-17 MED ORDER — AMIODARONE HCL 200 MG PO TABS
200.0000 mg | ORAL_TABLET | Freq: Every day | ORAL | Status: DC
  Administered 2020-11-18: 200 mg via ORAL
  Filled 2020-11-17: qty 1

## 2020-11-17 MED ORDER — ONDANSETRON HCL 4 MG/2ML IJ SOLN: 4.0000 mg | Freq: Four times a day (QID) | INTRAMUSCULAR | Status: DC | PRN

## 2020-11-17 MED ORDER — APIXABAN 5 MG PO TABS
5.0000 mg | ORAL_TABLET | Freq: Two times a day (BID) | ORAL | Status: DC
  Administered 2020-11-17 – 2020-11-18 (×2): 5 mg via ORAL
  Filled 2020-11-17 (×2): qty 1

## 2020-11-17 MED ORDER — MELATONIN 3 MG PO TABS: 6.0000 mg | ORAL_TABLET | Freq: Every evening | ORAL | Status: DC | PRN

## 2020-11-17 MED ORDER — LATANOPROST 0.005 % OP SOLN
1.0000 [drp] | Freq: Every day | OPHTHALMIC | Status: DC
  Administered 2020-11-17: 1 [drp] via OPHTHALMIC
  Filled 2020-11-17: qty 2.5

## 2020-11-17 MED ORDER — MELATONIN 5 MG PO TABS
5.0000 mg | ORAL_TABLET | Freq: Every evening | ORAL | Status: DC | PRN
  Filled 2020-11-17: qty 1

## 2020-11-17 NOTE — ED Provider Notes (Signed)
[facility] EMERGENCY DEPARTMENT Provider Note   CSN: [id number] Arrival date & time: 11/17/20  1537     History Chief Complaint  Patient presents with  . Weakness    [name] is a 87 y.o. female with a past medical history of A. Fib/flutter on Eliquis, for which she reports compliance, who presents today for evaluation of worsening shortness of breath. She called EMS for difficulty breathing. She has had cough, worsening shortness of breath and weakness for a week.  She did have diarrhea about 3 times today. She had a recent COVID exposure however tested negative with a home test on Saturday.  She reports she feels like she has a band around her chest.  This started today.  She reports compliance with her medications.   HPI     Past Medical History:  Diagnosis Date  . Arthritis   . Atrial flutter ([place])    s/p RFA 2003  . Breast cancer ([place])    1980's - lumpectomy only  . Colon polyps   . Diverticulosis   . DVT, lower extremity ([place]) 1998 and 1999   Bilateral  . Essential hypertension   . Gallstones   . GERD (gastroesophageal reflux disease)   . Glaucoma   . IBS (irritable bowel syndrome)   . Mixed hyperlipidemia   . Osteoporosis   . Persistent atrial fibrillation ([place])   . Pneumonia 2011  . Sick sinus syndrome ([facility])    PPM ([facility])  . Urinary tract infection   . Varicose veins     Patient Active Problem List   Diagnosis Date Noted  . Chest pressure 11/17/2020  . Osteoporosis 11/25/2018  . B12 deficiency 11/25/2018  . Atypical chest pain 09/01/2015  . GERD (gastroesophageal reflux disease) 09/01/2015  . Chronic anticoagulation 09/01/2015  . History of total knee arthroplasty 02/10/2015  . [place] anemia 12/08/2013  . Sick sinus syndrome ([place]) 03/22/2011  . Hyperlipidemia 02/02/2009  . Essential hypertension, benign 02/02/2009  . Paroxysmal atrial fibrillation ([place]) 05/15/2008  . COLONIC POLYPS, ADENOMATOUS, HX OF 05/15/2008    Past Surgical  History:  Procedure Laterality Date  . BREAST LUMPECTOMY  1980's   Right breast   . CARDIAC CATHETERIZATION  1999   Normal coronaries  . Cataracts Bilateral   . CHOLECYSTECTOMY    . COLONOSCOPY W/ BIOPSIES AND POLYPECTOMY  03/2003, 05/2008, 02/20/2011   severe diverticulosis, tubulovillous adenoma polyp, internal and external hemorrhoids 2012: severe diverticulosis, 4 mm polyp, hemorrhoids  . CYST REMOVAL TRUNK     under breast = on belly   . DILATION AND CURETTAGE OF UTERUS    . EYE SURGERY     cataracts - bilateral   . Fractured wrist Left 2013   repair  . INSERT / REPLACE / REMOVE PACEMAKER    . KNEE ARTHROSCOPY WITH MEDIAL MENISECTOMY Right 10/21/2014   Procedure: RIGHT KNEE ARTHROSCOPY WITH MEDIAL MENISECTOM,lateral menisectomy,synovectomy suprapatellar pouch;  Surgeon: Latanya Maudlin, MD;  Location: [facility];  Service: Orthopedics;  Laterality: Right;  . PACEMAKER GENERATOR CHANGE  06/18/12   [facility] DR RF, Dr Rayann Heman  . PACEMAKER INSERTION  2003  . PERMANENT PACEMAKER GENERATOR CHANGE N/A 06/18/2012   Procedure: PERMANENT PACEMAKER GENERATOR CHANGE;  Surgeon: Thompson Grayer, MD;  Location: [facility] CATH LAB;  Service: Cardiovascular;  Laterality: N/A;  . TOTAL KNEE ARTHROPLASTY  12/20/2011   Procedure: TOTAL KNEE ARTHROPLASTY;  Surgeon: Tobi Bastos, MD;  Location: [facility];  Service: Orthopedics;  Laterality: Left;  . TOTAL KNEE  ARTHROPLASTY Right 02/10/2015   Procedure: TOTAL RIGHT KNEE ARTHROPLASTY;  Surgeon: Latanya Maudlin, MD;  Location: [facility];  Service: Orthopedics;  Laterality: Right;     OB History   No obstetric history on file.     Family History  Problem Relation Age of Onset  . Throat cancer Father   . Stroke Mother   . Alzheimer's disease Mother   . Alzheimer's disease Brother   . Alzheimer's disease Sister   . Colon cancer Maternal Grandfather   . Stomach cancer Maternal Grandmother   . Heart disease Paternal Grandmother   . Heart attack Paternal Grandmother   .  Prostate cancer Son   . Colon cancer Sister   . Colon cancer Brother   . Heart disease Brother   . Atrial fibrillation Brother   . Diabetes Brother   . Dementia Brother   . Parkinson's disease Brother   . Prostate cancer Son     Social History   Tobacco Use  . Smoking status: Never Smoker  . Smokeless tobacco: Never Used  Vaping Use  . Vaping Use: Never used  Substance Use Topics  . Alcohol use: No    Alcohol/week: 0.0 standard drinks  . Drug use: No    Home Medications Prior to Admission medications   Medication Sig Start Date End Date Taking? Authorizing Provider  acetaminophen (TYLENOL) 500 MG tablet Take 1,000 mg by mouth every 6 (six) hours as needed for mild pain.   Yes [provider]  amiodarone (PACERONE) 200 MG tablet Take 200 mg by mouth daily.   Yes [provider]  calcium-vitamin D (OSCAL WITH D) 500-200 MG-UNIT tablet Take 1 tablet by mouth daily with breakfast.    Yes [provider]  cyanocobalamin (,VITAMIN B-12,) 1000 MCG/ML injection Inject 1 mL (1,000 mcg total) into the muscle every 30 (thirty) days. 05/31/20  Yes Dettinger, Fransisca Kaufmann, MD  ELIQUIS 5 MG TABS tablet TAKE ONE TABLET BY MOUTH TWICE DAILY 09/10/20  Yes Dettinger, Fransisca Kaufmann, MD  latanoprost (XALATAN) 0.005 % ophthalmic solution Place 1 drop into both eyes at bedtime.   Yes [provider]  melatonin 5 MG TABS Take 5 mg by mouth at bedtime as needed.   Yes [provider]  Multiple Vitamin (MULTIVITAMIN) tablet Take 1 tablet by mouth every morning.   Yes [provider]  nadolol (CORGARD) 40 MG tablet Take 1.5 tablets (60 mg total) by mouth daily. 12/26/19  Yes Dettinger, Fransisca Kaufmann, MD  nitroGLYCERIN (NITROSTAT) 0.4 MG SL tablet DISSOLVE 1 TAB UNDER TOUNGE FOR CHEST PAIN. MAY REPEAT EVERY 5 MINUTES FOR 3 DOSES. IF NO RELIEF CALL 911 OR GO TO ER Patient taking differently: Place 0.4 mg under the tongue every 5 (five) minutes as needed for chest pain.  09/10/20  Yes Satira Sark, MD  pantoprazole (PROTONIX) 40 MG tablet Take 1 tablet by mouth daily. 09/09/20  Yes [provider]  Wheat Dextrin (BENEFIBER) POWD Take 1 Dose by mouth daily.   Yes [provider]  docusate sodium (COLACE) 100 MG capsule Take 200 mg by mouth at bedtime. Patient not taking: No sig reported    [provider]    Allergies    Iodinated diagnostic agents and Naproxen  Review of Systems   Review of Systems  Constitutional: Positive for fatigue. Negative for chills and fever.  Respiratory: Positive for shortness of breath. Negative for cough and stridor.   Cardiovascular: Positive for chest pain and palpitations.  Gastrointestinal:  Positive for abdominal pain.  Neurological: Positive for weakness. Negative for numbness and headaches.  All other systems reviewed and are negative.   Physical Exam Updated Vital Signs BP (!) 144/96 (BP Location: Left Arm)   Pulse 87   Temp 98.2 F (36.8 C)   Resp 18   Ht 5\' 3"  (1.6 m)   Wt 75.8 kg   SpO2 100%   BMI 29.58 kg/m   Physical Exam Vitals and nursing note reviewed.  Constitutional:      General: She is not in acute distress.    Appearance: She is not ill-appearing or diaphoretic.  HENT:     Head: Normocephalic and atraumatic.  Eyes:     General: No scleral icterus.       Right eye: No discharge.        Left eye: No discharge.     Conjunctiva/sclera: Conjunctivae normal.  Cardiovascular:     Rate and Rhythm: Normal rate and regular rhythm.     Pulses: Normal pulses.     Heart sounds: Normal heart sounds.  Pulmonary:     Effort: Pulmonary effort is normal. No respiratory distress.     Breath sounds: Normal breath sounds. No stridor.  Abdominal:     General: There is no distension.     Tenderness: There is no abdominal tenderness. There is no guarding.  Musculoskeletal:        General: No deformity.     Cervical back: Normal range of motion and neck supple.     Right  lower leg: No edema.     Left lower leg: No edema.  Skin:    General: Skin is warm and dry.  Neurological:     Mental Status: She is alert and oriented to person, place, and time. Mental status is at baseline.     Motor: No abnormal muscle tone.  Psychiatric:        Mood and Affect: Mood normal.        Behavior: Behavior normal.        Thought Content: Thought content normal.     ED Results / Procedures / Treatments   Labs (all labs ordered are listed, but only abnormal results are displayed) Labs Reviewed  BASIC METABOLIC PANEL - Abnormal; Notable for the following components:      Result Value   Calcium 8.7 (*)    All other components within normal limits  CBC - Abnormal; Notable for the following components:   RBC 3.25 (*)    Hemoglobin 11.2 (*)    HCT 34.6 (*)    MCV 106.5 (*)    MCH 34.5 (*)    Platelets 122 (*)    All other components within normal limits  RESP PANEL BY RT-PCR (FLU A&B, COVID) ARPGX2  HEPATIC FUNCTION PANEL  LIPASE, BLOOD  CBC  POC SARS CORONAVIRUS 2 AG -  ED  TROPONIN I (HIGH SENSITIVITY)  TROPONIN I (HIGH SENSITIVITY)    EKG None  Radiology DG Chest Portable 1 View  Result Date: 11/17/2020 CLINICAL DATA:  Shortness of breath, chest pain EXAM: PORTABLE CHEST 1 VIEW COMPARISON:  12/03/2018 FINDINGS: Right pacer remains in place., No confluent opacities unchanged. Cardiomegaly, effusions or edema. No acute bony abnormality. IMPRESSION: Cardiomegaly.  No active disease. Electronically Signed   By: Rolm Baptise M.D.   On: 11/17/2020 17:08    Procedures Procedures   Medications Ordered in ED Medications  acetaminophen (TYLENOL) tablet 650 mg (has no administration in time range)  Or  acetaminophen (TYLENOL) suppository 650 mg (has no administration in time range)    ED Course  I have reviewed the triage vital signs and the nursing notes.  Pertinent labs & imaging results that were available during my care of the patient were reviewed by  me and considered in my medical decision making (see chart for details).    MDM Rules/Calculators/A&P                         Patient is a 87 year old woman who presents today for evaluation of chest pressure.  Her pressure feels like a band across her chest.  She also has had shortness of breath, weakness and generally feeling poorly over the past week. Here she is afebrile, not tachycardic, tachypneic or hypoxic.  CBC shows anemia with a hemoglobin of 11.2.  BMP is unremarkable.  Hepatic function panel is normal.  COVID antigen test is negative.  Chest x-ray shows cardiomegaly without consolidation pneumothorax or other acute abnormalities. Patient has permanent A. fib and is anticoagulated.  She has known cardiovascular disease. Given that she is having chest tightness with her history of cardiac disease she will require admission to the hospital for additional evaluation. I spoke with hospitalist who will see the patient for admission.  The patient appears reasonably stabilized for admission considering the current resources, flow, and capabilities available in the ED at this time, and I doubt any other [facility] requiring further screening and/or treatment in the ED prior to admission assuming timely admission and bed placement.  Note: Portions of this report may have been transcribed using voice recognition software. Every effort was made to ensure accuracy; however, inadvertent computerized transcription errors may be present   Final Clinical Impression(s) / ED Diagnoses Final diagnoses:  Chest pressure    Rx / DC Orders ED Discharge Orders    None       Ollen Gross 11/17/20 2116    Milton Ferguson, MD 11/18/20 1035

## 2020-11-17 NOTE — H&P (Signed)
History and Physical    [name] TLX:[id number] DOB: 07-06-1928 DOA: 11/17/2020  PCP: Dettinger, Fransisca Kaufmann, MD   Patient coming from: Home.  I have personally briefly reviewed patient's old medical records in [place]  Chief Complaint: Shortness of breath, chest pressure and weakness.  HPI: [name] is a 87 y.o. female with medical history significant of osteoarthritis, chronic atrial fibrillation/flutter, sick sinus syndrome, history of pacemaker placement, history of breast cancer, colon polyps, diverticulosis, bilateral DVT, hypertension, cholelithiasis, glaucoma, IBS, hyperlipidemia, osteoporosis, varicose veins of lower extremities who is coming to the emergency department with several days of pressure-like CP, non radiated, nonexertional, associated with dyspnea and palpitations followed by generalized weakness.  She denies diaphoresis, dizziness, wheezing, nausea or emesis.  The chest pain quality increases or changes when she is lying down in bed, particularly on the left side.  Denies PND or lower extremity edema.  No fevers, but complains of chills, nonproductive cough and 3 episodes of loose stools today.  Denies melena or hematochezia.  No dysuria, frequency or hematuria.  ED Course: Initial vital signs were temperature 98.2 F, pulse 63, respirations 20, BP 132/65 mmHg O2 sat 97% on room air.  Labwork: CBC showed a white count of 4.1, hemoglobin 11.2 g/dL platelets 122.  Troponin was standing and then 12 ng/L.  Lipase, BMP and hepatic function panel were unremarkable.  Imaging: A portable 1 view radiograph showed cardiomegaly without any active cardiopulmonary disease.  Please see image and full radiology report for further detail.  Review of Systems: As per HPI otherwise all other systems reviewed and are negative.  Past Medical History:  Diagnosis Date   Arthritis    Atrial flutter ([place])    s/p RFA 2003   Breast cancer ([place])    1980's -  lumpectomy only   Colon polyps    Diverticulosis    DVT, lower extremity ([place]) 1998 and 1999   Bilateral   Essential hypertension    Gallstones    GERD (gastroesophageal reflux disease)    Glaucoma    IBS (irritable bowel syndrome)    Mixed hyperlipidemia    Osteoporosis    Persistent atrial fibrillation ([place])    Pneumonia 2011   Sick sinus syndrome ([facility])    PPM ([facility])   Urinary tract infection    Varicose veins     Past Surgical History:  Procedure Laterality Date   BREAST LUMPECTOMY  1980's   Right breast    CARDIAC CATHETERIZATION  1999   Normal coronaries   Cataracts Bilateral    CHOLECYSTECTOMY     COLONOSCOPY W/ BIOPSIES AND POLYPECTOMY  03/2003, 05/2008, 02/20/2011   severe diverticulosis, tubulovillous adenoma polyp, internal and external hemorrhoids 2012: severe diverticulosis, 4 mm polyp, hemorrhoids   CYST REMOVAL TRUNK     under breast = on belly    DILATION AND CURETTAGE OF UTERUS     EYE SURGERY     cataracts - bilateral    Fractured wrist Left 2013   repair   INSERT / REPLACE / REMOVE PACEMAKER     KNEE ARTHROSCOPY WITH MEDIAL MENISECTOMY Right 10/21/2014   Procedure: RIGHT KNEE ARTHROSCOPY WITH MEDIAL MENISECTOM,lateral menisectomy,synovectomy suprapatellar pouch;  Surgeon: Latanya Maudlin, MD;  Location: [facility];  Service: Orthopedics;  Laterality: Right;   PACEMAKER GENERATOR CHANGE  06/18/12   [facility] DR RF, Dr Rayann Heman   PACEMAKER INSERTION  2003   PERMANENT PACEMAKER GENERATOR CHANGE N/A 06/18/2012   Procedure: PERMANENT PACEMAKER GENERATOR CHANGE;  Surgeon: Thompson Grayer, MD;  Location[facility] C[facility] LAB;  Service: Cardiovascular;  Laterality: N/A;   TOTAL KNEE ARTHROPLASTY  12/20/2011   Procedure: TOTAL KNEE ARTHROPLASTY;  Surgeon: Tobi Bastos, MD;  Location: [facility];  Service: Orthopedics;  Laterality: Left;   TOTAL KNEE ARTHROPLASTY Right 02/10/2015   Procedure: TOTAL RIGHT KNEE ARTHROPLASTY;  Surgeon: Latanya Maudlin, MD;  Location: [facility];  Service:  Orthopedics;  Laterality: Right;   Social History  reports that she has never smoked. She has never used smokeless tobacco. She reports that she does not drink alcohol and does not use drugs.  Allergies  Allergen Reactions   Iodinated Diagnostic Agents Hives, Itching and Rash    03/13/14: symptoms began within two hours of intrathecal injection (myelogram 03/11/14); on shoulders radiating down to stomach.  Relief with Benadryl.  Told patient she would need to pre-med with Benadryl in the future before receiving this contrast.  Brita Romp, RN   Naproxen Hives, Itching and Rash   Family History  Problem Relation Age of Onset   Throat cancer Father    Stroke Mother    Alzheimer's disease Mother    Alzheimer's disease Brother    Alzheimer's disease Sister    Colon cancer Maternal Grandfather    Stomach cancer Maternal Grandmother    Heart disease Paternal Grandmother    Heart attack Paternal Grandmother    Prostate cancer Son    Colon cancer Sister    Colon cancer Brother    Heart disease Brother    Atrial fibrillation Brother    Diabetes Brother    Dementia Brother    Parkinson's disease Brother    Prostate cancer Son    Prior to Admission medications   Medication Sig Start Date End Date Taking? Authorizing Provider  acetaminophen (TYLENOL) 500 MG tablet Take 1,000 mg by mouth every 6 (six) hours as needed for mild pain.   Yes [provider]  amiodarone (PACERONE) 200 MG tablet Take 200 mg by mouth daily.   Yes [provider]  calcium-vitamin D (OSCAL WITH D) 500-200 MG-UNIT tablet Take 1 tablet by mouth daily with breakfast.    Yes [provider]  cyanocobalamin (,VITAMIN B-12,) 1000 MCG/ML injection Inject 1 mL (1,000 mcg total) into the muscle every 30 (thirty) days. 05/31/20  Yes Dettinger, Fransisca Kaufmann, MD  ELIQUIS 5 MG TABS tablet TAKE ONE TABLET BY MOUTH TWICE DAILY 09/10/20  Yes Dettinger, Fransisca Kaufmann, MD  latanoprost (XALATAN) 0.005 % ophthalmic  solution Place 1 drop into both eyes at bedtime.   Yes [provider]  melatonin 5 MG TABS Take 5 mg by mouth at bedtime as needed.   Yes [provider]  Multiple Vitamin (MULTIVITAMIN) tablet Take 1 tablet by mouth every morning.   Yes [provider]  nadolol (CORGARD) 40 MG tablet Take 1.5 tablets (60 mg total) by mouth daily. 12/26/19  Yes Dettinger, Fransisca Kaufmann, MD  nitroGLYCERIN (NITROSTAT) 0.4 MG SL tablet DISSOLVE 1 TAB UNDER TOUNGE FOR CHEST PAIN. MAY REPEAT EVERY 5 MINUTES FOR 3 DOSES. IF NO RELIEF CALL 911 OR GO TO ER Patient taking differently: Place 0.4 mg under the tongue every 5 (five) minutes as needed for chest pain. 09/10/20  Yes Satira Sark, MD  pantoprazole (PROTONIX) 40 MG tablet Take 1 tablet by mouth daily. 09/09/20  Yes [provider]  Wheat Dextrin (BENEFIBER) POWD Take 1 Dose by mouth daily.   Yes [provider]  docusate sodium (COLACE) 100  MG capsule Take 200 mg by mouth at bedtime. Patient not taking: No sig reported    [provider]   Physical Exam: Vitals:   11/17/20 1900 11/17/20 1945 11/17/20 2037 11/17/20 2116  BP: (!) 162/77 (!) 157/86 (!) 162/91 (!) 144/96  Pulse: (!) 58 60 66 87  Resp: (!) 26 14 18 18   Temp:  98 F (36.7 C) 98 F (36.7 C) 98.2 F (36.8 C)  TempSrc:   Oral   SpO2: 97% 96% 100% 100%  Weight:      Height:       Constitutional: NAD, calm, comfortable Eyes: PERRL, lids and conjunctivae normal ENMT: Mucous membranes are mildly dry. Posterior pharynx clear of any exudate or lesions. Neck: normal, supple, no masses, no thyromegaly Respiratory: clear to auscultation bilaterally, no wheezing, no crackles. Normal respiratory effort. No accessory muscle use.  Cardiovascular: Irregularly irregular, no murmurs / rubs / gallops. No extremity edema. 2+ pedal pulses. No carotid bruits.  Abdomen: No distention.  Bowel sounds positive.  Soft, no tenderness, no masses palpated. No  hepatosplenomegaly. Musculoskeletal: Mild generalized weakness.  No clubbing / cyanosis. Good ROM, no contractures. Normal muscle tone.  Skin: Varicose veins to lower extremities. Neurologic: CN 2-12 grossly intact. Sensation intact, DTR normal. Strength 5/5 in all 4.  Psychiatric: Normal judgment and insight. Alert and oriented x 3. Normal mood.   Labs on Admission: I have personally reviewed following labs and imaging studies  CBC: Recent Labs  Lab 11/17/20 1610  WBC 4.1  HGB 11.2*  HCT 34.6*  MCV 106.5*  PLT 981*   Basic Metabolic Panel: Recent Labs  Lab 11/17/20 1610  NA 136  K 4.0  CL 104  CO2 27  GLUCOSE 97  BUN 11  CREATININE 0.77  CALCIUM 8.7*   GFR: Estimated Creatinine Clearance: 46.5 mL/min (by C-G formula based on SCr of 0.77 mg/dL).  Liver Function Tests: Recent Labs  Lab 11/17/20 1917  AST 23  ALT 19  ALKPHOS 58  BILITOT 0.8  PROT 6.5  ALBUMIN 3.7   Radiological Exams on Admission: DG Chest Portable 1 View  Result Date: 11/17/2020 CLINICAL DATA:  Shortness of breath, chest pain EXAM: PORTABLE CHEST 1 VIEW COMPARISON:  12/03/2018 FINDINGS: Right pacer remains in place., No confluent opacities unchanged. Cardiomegaly, effusions or edema. No acute bony abnormality. IMPRESSION: Cardiomegaly.  No active disease. Electronically Signed   By: Rolm Baptise M.D.   On: 11/17/2020 17:08    EKG: Independently reviewed.  Vent. rate 60 BPM PR interval * ms QRS duration 191 ms QT/QTcB 506/506 ms P-R-T axes * 270 78 Atrial fibrillation LVH with IVCD, LAD and secondary repol abnrm Anterolateral infarct, age indeterminate Prolonged QT interval  Assessment/Plan Principal Problem:   Chest pressure Observation/telemetry. Nitroglycerin as needed. Continue beta-blocker. Continue anticoagulation. Check echocardiogram. Routine cardiology consult in AM.  Active Problems:   Essential hypertension, benign Continue on nadolol 40 mg p.o. daily. Monitor blood  pressure and heart rate.    Paroxysmal atrial fibrillation (HCC) CHA?DS?-VASc Score of at least 6. (Age, female, cardiomegaly, hypertension, vascular disease). Continue apixaban 5 mg p.o. twice daily. Continue amiodarone 200 mg p.o. daily. Continue nadolol 60 mg p.o. daily.    GERD (gastroesophageal reflux disease) Continue pantoprazole 40 mg p.o. daily.    Glaucoma Continue Xalatan drops.    Macrocytic anemia Recheck CBC. Has been taking B12 injections. Check anemia panel.    Hyperlipidemia Not on medical therapy. Follow-up with cardiology or PCP.    Diarrhea  in adult patient Has history of IBS. No fever or leukocytosis. Gentle/time-limited IV hydration.    DVT prophylaxis: On apixaban. Code Status:   Full code. Family Communication:   Disposition Plan:   Patient is from:  Home.  Anticipated DC to:  Home.  Anticipated DC date:  11/18/2020 or 11/19/2020.  Anticipated DC barriers: Clinical status, pending cardiology consult/echo.  Consults called:  AM routine cardiology consult. Admission status:  Observation/telemetry.  High severity in the setting of new chest pain with  Severity of Illness:  Reubin Milan MD [facility]  How to contact the [facility] Attending or Consulting provider [place] or covering provider during after hours [place], for this patient?   Check the care team in [facility] and look for a) attending/consulting [facility] provider listed and b) the [facility] team listed Log into [email or web address] and use [place]'s universal password to access. If you do not have the password, please contact the hospital operator. Locate the [facility] provider you are looking for under [facility] and page to a number that you can be directly reached. If you still have difficulty reaching the provider, please page the [facility] (Director on Call) for the Hospitalists listed on amion for assistance.  11/17/2020, 10:06 PM   This document was prepared using Dragon voice recognition  software and may contain some unintended transcription errors.

## 2020-11-17 NOTE — ED Triage Notes (Addendum)
EMS reports pt from home,  Called out initially for breathing problems.  Reports FD put pt on oxygen prior to EMS arrival.  However, ems reports o2 sat 97% on room air.  Reports lungs clear, and denied sob.   Reports cough and weakness x 1 week and  Diarrhea x 3 days.  Reports recent exposure to covid but tested negative for covid Saturday.  Reports vitals bp 117/67, hr 70, rr 14, and 96% on room air.  Pt reports she has had sob, chest tightness that started today.

## 2020-11-18 ENCOUNTER — Observation Stay (HOSPITAL_BASED_OUTPATIENT_CLINIC_OR_DEPARTMENT_OTHER): Payer: Medicare Other

## 2020-11-18 ENCOUNTER — Other Ambulatory Visit (HOSPITAL_COMMUNITY): Payer: Self-pay | Admitting: *Deleted

## 2020-11-18 DIAGNOSIS — I480 Paroxysmal atrial fibrillation: Secondary | ICD-10-CM

## 2020-11-18 DIAGNOSIS — I340 Nonrheumatic mitral (valve) insufficiency: Secondary | ICD-10-CM

## 2020-11-18 DIAGNOSIS — R079 Chest pain, unspecified: Secondary | ICD-10-CM

## 2020-11-18 DIAGNOSIS — I361 Nonrheumatic tricuspid (valve) insufficiency: Secondary | ICD-10-CM

## 2020-11-18 DIAGNOSIS — R0789 Other chest pain: Principal | ICD-10-CM

## 2020-11-18 DIAGNOSIS — R197 Diarrhea, unspecified: Secondary | ICD-10-CM

## 2020-11-18 DIAGNOSIS — I272 Pulmonary hypertension, unspecified: Secondary | ICD-10-CM | POA: Diagnosis present

## 2020-11-18 DIAGNOSIS — I10 Essential (primary) hypertension: Secondary | ICD-10-CM | POA: Diagnosis not present

## 2020-11-18 LAB — FOLATE: Folate: 36.5 ng/mL (ref >5.9)

## 2020-11-18 LAB — SEDIMENTATION RATE: Sed Rate: 13 mm/hr (ref 0–22)

## 2020-11-18 LAB — C-REACTIVE PROTEIN: CRP: 0.5 mg/dL (ref <1.0)

## 2020-11-18 LAB — CBC
HCT: 34.8 % — ABNORMAL LOW (ref 36.0–46.0)
Hemoglobin: 11.5 g/dL — ABNORMAL LOW (ref 12.0–15.0)
MCH: 34.6 pg — ABNORMAL HIGH (ref 26.0–34.0)
MCHC: 33 g/dL (ref 30.0–36.0)
MCV: 104.8 fL — ABNORMAL HIGH (ref 80.0–100.0)
Platelets: 117 10*3/uL — ABNORMAL LOW (ref 150–400)
RBC: 3.32 MIL/uL — ABNORMAL LOW (ref 3.87–5.11)
RDW: 13.6 % (ref 11.5–15.5)
WBC: 3.8 10*3/uL — ABNORMAL LOW (ref 4.0–10.5)
nRBC: 0 % (ref 0.0–0.2)

## 2020-11-18 LAB — ECHOCARDIOGRAM COMPLETE
AR max vel: 1.37 cm2
AV Area VTI: 1.31 cm2
AV Area mean vel: 1.37 cm2
AV Mean grad: 7 mmHg
AV Peak grad: 14 mmHg
Ao pk vel: 1.87 m/s
Area-P 1/2: 4.39 cm2
Height: 63 in
S' Lateral: 3 cm
Weight: 2677.27 oz

## 2020-11-18 LAB — IRON AND TIBC
Iron: 82 ug/dL (ref 28–170)
Saturation Ratios: 21 % (ref 10.4–31.8)
TIBC: 395 ug/dL (ref 250–450)
UIBC: 313 ug/dL

## 2020-11-18 LAB — RETICULOCYTES
Immature Retic Fract: 12.6 % (ref 2.3–15.9)
RBC.: 3.27 MIL/uL — ABNORMAL LOW (ref 3.87–5.11)
Retic Count, Absolute: 58.5 10*3/uL (ref 19.0–186.0)
Retic Ct Pct: 1.8 % (ref 0.4–3.1)

## 2020-11-18 LAB — VITAMIN B12: Vitamin B-12: 643 pg/mL (ref 180–914)

## 2020-11-18 LAB — FERRITIN: Ferritin: 17 ng/mL (ref 11–307)

## 2020-11-18 MED ORDER — SODIUM CHLORIDE 0.45 % IV SOLN: INTRAVENOUS | Status: DC

## 2020-11-18 MED ORDER — FUROSEMIDE 20 MG PO TABS: 20.0000 mg | ORAL_TABLET | Freq: Every day | ORAL | 1 refills | Status: DC

## 2020-11-18 NOTE — Progress Notes (Signed)
*  PRELIMINARY RESULTS* Echocardiogram 2D Echocardiogram has been performed.  [name] 11/18/2020, 5:02 PM

## 2020-11-18 NOTE — Discharge Summary (Signed)
[name], is a 87 y.o. female  DOB 1928/07/31  MRN [id number].  Admission date:  11/17/2020  Admitting Physician  Reubin Milan, MD  Discharge Date:  11/18/2020   Primary MD  Dettinger, Fransisca Kaufmann, MD  Recommendations for primary care physician for things to follow:   1)Very low-salt diet advised 2)Weigh yourself daily, call if you gain more than 3 pounds in 1 day or more than 5 pounds in 1 week as your diuretic medications may need to be adjusted 3)Follow-up with Dr. Domenic Polite for outpatient stress testing 4)Take Lasix /Furosemide fluid pill 20 mg daily as advised 5)You are taking Apixaban/Eliquis which is a blood thinner so please Avoid ibuprofen/Advil/Aleve/Motrin/Goody Powders/Naproxen/BC powders/Meloxicam/Diclofenac/Indomethacin and other Nonsteroidal anti-inflammatory medications as these will make you more likely to bleed and can cause stomach ulcers, can also cause Kidney problems.    Admission Diagnosis  Chest pressure [R07.89]   Discharge Diagnosis  Chest pressure [R07.89]    Principal Problem:   Chest pressure Active Problems:   Paroxysmal atrial fibrillation ([place])   Pulmonary HTN /echo 11/18/2020   Hyperlipidemia   Essential hypertension, benign   GERD (gastroesophageal reflux disease)   Glaucoma   Macrocytic anemia   Diarrhea in adult patient      Past Medical History:  Diagnosis Date   Arthritis    Atrial flutter ([place])    s/p RFA 2003   Breast cancer ([place])    1980's - lumpectomy only   Colon polyps    Diverticulosis    DVT, lower extremity ([place]) 1998 and 1999   Bilateral   Essential hypertension    Gallstones    GERD (gastroesophageal reflux disease)    Glaucoma    IBS (irritable bowel syndrome)    Mixed hyperlipidemia    Osteoporosis    Persistent atrial fibrillation ([place])    Pneumonia 2011   Sick sinus syndrome ([facility])    PPM ([facility])   Urinary tract  infection    Varicose veins     Past Surgical History:  Procedure Laterality Date   BREAST LUMPECTOMY  1980's   Right breast    CARDIAC CATHETERIZATION  1999   Normal coronaries   Cataracts Bilateral    CHOLECYSTECTOMY     COLONOSCOPY W/ BIOPSIES AND POLYPECTOMY  03/2003, 05/2008, 02/20/2011   severe diverticulosis, tubulovillous adenoma polyp, internal and external hemorrhoids 2012: severe diverticulosis, 4 mm polyp, hemorrhoids   CYST REMOVAL TRUNK     under breast = on belly    DILATION AND CURETTAGE OF UTERUS     EYE SURGERY     cataracts - bilateral    Fractured wrist Left 2013   repair   INSERT / REPLACE / REMOVE PACEMAKER     KNEE ARTHROSCOPY WITH MEDIAL MENISECTOMY Right 10/21/2014   Procedure: RIGHT KNEE ARTHROSCOPY WITH MEDIAL MENISECTOM,lateral menisectomy,synovectomy suprapatellar pouch;  Surgeon: Latanya Maudlin, MD;  Location: [facility];  Service: Orthopedics;  Laterality: Right;   PACEMAKER GENERATOR CHANGE  06/18/12   [facility] DR RF, Dr Rayann Heman  PACEMAKER INSERTION  2003   PERMANENT PACEMAKER GENERATOR CHANGE N/A 06/18/2012   Procedure: PERMANENT PACEMAKER GENERATOR CHANGE;  Surgeon: [place] Grayer, MD;  Location: [facility] CATH LAB;  Service: Cardiovascular;  Laterality: N/A;   TOTAL KNEE ARTHROPLASTY  12/20/2011   Procedure: TOTAL KNEE ARTHROPLASTY;  Surgeon: Tobi Bastos, MD;  Location: [facility];  Service: Orthopedics;  Laterality: Left;   TOTAL KNEE ARTHROPLASTY Right 02/10/2015   Procedure: TOTAL RIGHT KNEE ARTHROPLASTY;  Surgeon: Latanya Maudlin, MD;  Location: [facility];  Service: Orthopedics;  Laterality: Right;       HPI  from the history and physical done on the day of admission:   Chief Complaint: Shortness of breath, chest pressure and weakness.   HPI: [name] is a 87 y.o. female with medical history significant of osteoarthritis, chronic atrial fibrillation/flutter, sick sinus syndrome, history of pacemaker placement, history of breast cancer, colon  polyps, diverticulosis, bilateral DVT, hypertension, cholelithiasis, glaucoma, IBS, hyperlipidemia, osteoporosis, varicose veins of lower extremities who is coming to the emergency department with several days of pressure-like CP, non radiated, nonexertional, associated with dyspnea and palpitations followed by generalized weakness.  She denies diaphoresis, dizziness, wheezing, nausea or emesis.  The chest pain quality increases or changes when she is lying down in bed, particularly on the left side.  Denies PND or lower extremity edema.  No fevers, but complains of chills, nonproductive cough and 3 episodes of loose stools today.  Denies melena or hematochezia.  No dysuria, frequency or hematuria.   ED Course: Initial vital signs were temperature 98.2 F, pulse 63, respirations 20, BP 132/65 mmHg O2 sat 97% on room air.   Labwork: CBC showed a white count of 4.1, hemoglobin 11.2 g/dL platelets 122.  Troponin was standing and then 12 ng/L.  Lipase, BMP and hepatic function panel were unremarkable.   Imaging: A portable 1 view radiograph showed cardiomegaly without any active cardiopulmonary disease.  Please see image and full radiology report for further detail.       Hospital Course:     Chest Pain with Atypical Features -Ruled out for ACS by cardiac enzymes and EKG -CRP and ESR unremarkable making significant inflammatory component unlikely -Echo with preserved EF but pulmonary hypertension noted -Dr. Domenic Polite advises discharge home on Lasix 20 mg daily with outpatient stress testing -Please see full cardiology consult note   2. Paroxysmal Atrial Flutter/sss/S/p Pacemaker - She is s/p ablation in 2003. Her recent device interrogation showed < 1% AF burden. -Continue nadolol and  amiodarone  -Continue Eliquis 5 mg twice daily   3) diarrhea----resolved  4)GERD--- continue PPI  5)Macrocytic anemia/pancytopenia --- continue B12 shots, anemia work-up including ferritin, serum iron, B12 and  folate levels WNL   Discharge Condition: stable  Follow UP   Follow-up Information     Satira Sark, MD Follow up.   Specialty: Cardiology Why: The office will contact you in 1-2 business days to arrange for an outpatient stress test and to arrange a follow-up visit. Contact information: [place] [address] [phone]                  Consults obtained - cardiology  Diet and Activity recommendation:  As advised  Discharge Instructions    Discharge Instructions     Call MD for:  difficulty breathing, headache or visual disturbances   Complete by: As directed    Call MD for:  persistant dizziness or light-headedness   Complete by: As directed  Call MD for:  persistant nausea and vomiting   Complete by: As directed    Call MD for:  severe uncontrolled pain   Complete by: As directed    Call MD for:  temperature >100.4   Complete by: As directed    Diet - low sodium heart healthy   Complete by: As directed    Discharge instructions   Complete by: As directed    1)Very low-salt diet advised 2)Weigh yourself daily, call if you gain more than 3 pounds in 1 day or more than 5 pounds in 1 week as your diuretic medications may need to be adjusted 3)Follow-up with Dr. Domenic Polite for outpatient stress testing 4)Take Lasix /Furosemide fluid pill 20 mg daily as advised 5)You are taking Apixaban/Eliquis which is a blood thinner so please Avoid ibuprofen/Advil/Aleve/Motrin/Goody Powders/Naproxen/BC powders/Meloxicam/Diclofenac/Indomethacin and other Nonsteroidal anti-inflammatory medications as these will make you more likely to bleed and can cause stomach ulcers, can also cause Kidney problems.   Increase activity slowly   Complete by: As directed          Discharge Medications     Allergies as of 11/18/2020       Reactions   Iodinated Diagnostic Agents Hives, Itching, Rash   03/13/14: symptoms began within two hours of intrathecal injection  (myelogram 03/11/14); on shoulders radiating down to stomach.  Relief with Benadryl.  Told patient she would need to pre-med with Benadryl in the future before receiving this contrast.  Brita Romp, RN   Naproxen Hives, Itching, Rash        Medication List     STOP taking these medications    docusate sodium 100 MG capsule Commonly known as: COLACE       TAKE these medications    acetaminophen 500 MG tablet Commonly known as: TYLENOL Take 1,000 mg by mouth every 6 (six) hours as needed for mild pain.   amiodarone 200 MG tablet Commonly known as: PACERONE Take 200 mg by mouth daily.   Benefiber Powd Take 1 Dose by mouth daily.   calcium-vitamin D 500-200 MG-UNIT tablet Commonly known as: OSCAL WITH D Take 1 tablet by mouth daily with breakfast.   cyanocobalamin 1000 MCG/ML injection Commonly known as: (VITAMIN B-12) Inject 1 mL (1,000 mcg total) into the muscle every 30 (thirty) days.   Eliquis 5 MG Tabs tablet Generic drug: apixaban TAKE ONE TABLET BY MOUTH TWICE DAILY   furosemide 20 MG tablet Commonly known as: Lasix Take 1 tablet (20 mg total) by mouth daily.   latanoprost 0.005 % ophthalmic solution Commonly known as: XALATAN Place 1 drop into both eyes at bedtime.   melatonin 5 MG Tabs Take 5 mg by mouth at bedtime as needed.   multivitamin tablet Take 1 tablet by mouth every morning.   nadolol 40 MG tablet Commonly known as: CORGARD Take 1.5 tablets (60 mg total) by mouth daily.   nitroGLYCERIN 0.4 MG SL tablet Commonly known as: NITROSTAT DISSOLVE 1 TAB UNDER TOUNGE FOR CHEST PAIN. MAY REPEAT EVERY 5 MINUTES FOR 3 DOSES. IF NO RELIEF CALL 911 OR GO TO ER What changed: See the new instructions.   pantoprazole 40 MG tablet Commonly known as: PROTONIX Take 1 tablet by mouth daily.        Major procedures and Radiology Reports - PLEASE review detailed and final reports for all details, in brief -    DG Chest Portable 1 View  Result Date:  11/17/2020 CLINICAL DATA:  Shortness of breath, chest pain  EXAM: PORTABLE CHEST 1 VIEW COMPARISON:  12/03/2018 FINDINGS: Right pacer remains in place., No confluent opacities unchanged. Cardiomegaly, effusions or edema. No acute bony abnormality. IMPRESSION: Cardiomegaly.  No active disease. Electronically Signed   By: Rolm Baptise M.D.   On: 11/17/2020 17:08   ECHOCARDIOGRAM COMPLETE  Result Date: 11/18/2020    ECHOCARDIOGRAM REPORT   Patient Name:   MADISIN HASAN Glauser Date of Exam: 11/18/2020 Medical Rec #:  [id number]           Height:       63.0 in Accession #:    [id number]          Weight:       167.3 lb Date of Birth:  06-Apr-1929           BSA:          1.792 m Patient Age:    65 years            BP:           140/66 mmHg Patient Gender: F                   HR:           66 bpm. Exam Location:  [facility] Procedure: 2D Echo, Cardiac Doppler and Color Doppler Indications:    Chest Pain R07.9  History:        Patient has prior history of Echocardiogram examinations, most                 recent 12/23/2001. Arrythmias:Paroxysmal atrial fibrillation and                 Atrial Flutter; Risk Factors:Hypertension and Dyslipidemia.                 Chronic anticoagulation, Breast Cancer, DVT, lower extremity                 ([place]) (From Hx).  Sonographer:    Alvino Chapel RCS Referring Phys: [id number] Banks  1. Left ventricular ejection fraction, by estimation, is 55 to 60%. The left ventricle has normal function. The left ventricle has no regional wall motion abnormalities. There is moderate left ventricular hypertrophy. Left ventricular diastolic parameters are consistent with Grade II diastolic dysfunction (pseudonormalization).  2. Right ventricular systolic function is normal. The right ventricular size is normal. There is severely elevated pulmonary artery systolic pressure. The estimated right ventricular systolic pressure is 94.8 mmHg.  3. Left atrial size was mildly dilated.  4. Right  atrial size was mildly dilated.  5. The mitral valve is degenerative. Mild mitral valve regurgitation.  6. Tricuspid valve regurgitation is moderate.  7. The aortic valve is tricuspid. There is mild calcification of the aortic valve. Aortic valve regurgitation is not visualized. Mild to moderate aortic valve sclerosis/calcification is present, without any evidence of aortic stenosis. Aortic valve mean  gradient measures 7.0 mmHg.  8. The inferior vena cava is dilated in size with >50% respiratory variability, suggesting right atrial pressure of 8 mmHg. FINDINGS  Left Ventricle: Left ventricular ejection fraction, by estimation, is 55 to 60%. The left ventricle has normal function. The left ventricle has no regional wall motion abnormalities. The left ventricular internal cavity size was normal in size. There is  moderate left ventricular hypertrophy. Left ventricular diastolic parameters are consistent with Grade II diastolic dysfunction (pseudonormalization). Right Ventricle: The right ventricular size is normal. No increase in right ventricular wall thickness. Right ventricular  systolic function is normal. There is severely elevated pulmonary artery systolic pressure. The tricuspid regurgitant velocity is 3.69 m/s, and with an assumed right atrial pressure of 8 mmHg, the estimated right ventricular systolic pressure is 41.9 mmHg. Left Atrium: Left atrial size was mildly dilated. Right Atrium: Right atrial size was mildly dilated. Pericardium: There is no evidence of pericardial effusion. Mitral Valve: The mitral valve is degenerative in appearance. There is mild thickening of the mitral valve leaflet(s). Mild mitral valve regurgitation. Tricuspid Valve: The tricuspid valve is grossly normal. Tricuspid valve regurgitation is moderate. Aortic Valve: The aortic valve is tricuspid. There is mild calcification of the aortic valve. There is mild aortic valve annular calcification. Aortic valve regurgitation is not  visualized. Mild to moderate aortic valve sclerosis/calcification is present, without any evidence of aortic stenosis. Aortic valve mean gradient measures 7.0 mmHg. Aortic valve peak gradient measures 14.0 mmHg. Aortic valve area, by VTI measures 1.31 cm. Pulmonic Valve: The pulmonic valve was grossly normal. Pulmonic valve regurgitation is mild. Aorta: The aortic root is normal in size and structure. Venous: The inferior vena cava is dilated in size with greater than 50% respiratory variability, suggesting right atrial pressure of 8 mmHg. IAS/Shunts: No atrial level shunt detected by color flow Doppler. Additional Comments: A device lead is visualized.  LEFT VENTRICLE PLAX 2D LVIDd:         4.30 cm  Diastology LVIDs:         3.00 cm  LV e' medial:    5.22 cm/s LV PW:         1.20 cm  LV E/e' medial:  17.5 LV IVS:        1.50 cm  LV e' lateral:   6.85 cm/s LVOT diam:     1.90 cm  LV E/e' lateral: 13.4 LV SV:         51 LV SV Index:   28 LVOT Area:     2.84 cm  RIGHT VENTRICLE RV S prime:     6.64 cm/s TAPSE (M-mode): 1.3 cm LEFT ATRIUM             Index       RIGHT ATRIUM           Index LA diam:        4.10 cm 2.29 cm/m  RA Area:     22.00 cm LA Vol (A2C):   44.0 ml 24.55 ml/m RA Volume:   68.20 ml  38.05 ml/m LA Vol (A4C):   65.5 ml 36.54 ml/m LA Biplane Vol: 57.8 ml 32.25 ml/m  AORTIC VALVE AV Area (Vmax):    1.37 cm AV Area (Vmean):   1.37 cm AV Area (VTI):     1.31 cm AV Vmax:           187.00 cm/s AV Vmean:          118.000 cm/s AV VTI:            0.386 m AV Peak Grad:      14.0 mmHg AV Mean Grad:      7.0 mmHg LVOT Vmax:         90.30 cm/s LVOT Vmean:        57.200 cm/s LVOT VTI:          0.179 m LVOT/AV VTI ratio: 0.46  AORTA Ao Root diam: 3.40 cm MITRAL VALVE               TRICUSPID VALVE MV Area (PHT):  4.39 cm    TR Peak grad:   54.5 mmHg MV Decel Time: 173 msec    TR Vmax:        369.00 cm/s MV E velocity: 91.50 cm/s MV A velocity: 27.40 cm/s  SHUNTS MV E/A ratio:  3.34        Systemic VTI:   0.18 m                            Systemic Diam: 1.90 cm Rozann Lesches MD Electronically signed by Rozann Lesches MD Signature Date/Time: 11/18/2020/5:11:44 PM    Final    DG ESOPHAGUS W DOUBLE CM (HD)  Result Date: 10/26/2020 CLINICAL DATA:  Dysphagia with sensation of food getting stuck on right-side. Gastroesophageal reflux disease. EXAM: ESOPHOGRAM / BARIUM SWALLOW / BARIUM TABLET STUDY TECHNIQUE: Combined double contrast and single contrast examination performed using effervescent crystals, thick barium liquid, and thin barium liquid. The patient was observed with fluoroscopy swallowing a 13 mm barium sulphate tablet. FLUOROSCOPY TIME:  Fluoroscopy Time:  2 minutes and 12 seconds Radiation Exposure Index (if provided by the fluoroscopic device): 26.6 mGy Number of Acquired Spot Images: 0 COMPARISON:  Chest CT 04/25/2018. FINDINGS: Hypopharyngeal portion of the exam is unremarkable. Double contrast evaluation of the esophagus demonstrates no mucosal abnormality. Note is made of a dual lead pacer. Evaluation of esophageal peristalsis demonstrates relatively mild for age contrast stasis in the lower esophagus with mild proximal escape waves. Full column evaluation of the esophagus demonstrates no persistent narrowing or stricture. A 13 mm barium tablet passes promptly. IMPRESSION: 1. No esophageal stricture or other explanation for patient's symptoms. 2. Mild esophageal dysmotility, consistent with mild for age presbyesophagus. Electronically Signed   By: Abigail Miyamoto M.D.   On: 10/26/2020 11:01    Micro Results   No results found for this or any previous visit (from the past 240 hour(s)).   Today   Subjective    [name] today has no new complaints  -Currently chest pain-free without dyspnea, no pleuritic symptoms          Patient has been seen and examined prior to discharge   Objective   Blood pressure 134/76, pulse 66, temperature (!) 97.5 F (36.4 C), temperature source Oral,  resp. rate 17, height $RemoveBe'5\' 3"'heUurBzDa$  (1.6 m), weight 75.9 kg, SpO2 100 %.   Intake/Output Summary (Last 24 hours) at 11/18/2020 1730 Last data filed at 11/18/2020 1300 Gross per 24 hour  Intake 835.21 ml  Output --  Net 835.21 ml    Exam Gen:- Awake Alert, no acute distress  HEENT:- [place].AT, No sclera icterus Neck-Supple Neck,No JVD,.  Lungs-  CTAB , good air movement bilaterally  CV- S1, S2 normal, irregular, pacemaker in situ Abd-  +ve B.Sounds, Abd Soft, No tenderness,    Extremity/Skin:- No  edema,   good pulses Psych-affect is appropriate, oriented x3 Neuro-no new focal deficits, no tremors    Data Review   CBC w Diff:  Lab Results  Component Value Date   WBC 3.8 (L) 11/18/2020   HGB 11.5 (L) 11/18/2020   HGB 11.5 04/28/2020   HCT 34.8 (L) 11/18/2020   HCT 33.0 (L) 04/28/2020   PLT 117 (L) 11/18/2020   PLT 137 (L) 04/28/2020   LYMPHOPCT 34 02/03/2015   MONOPCT 11 02/03/2015   EOSPCT 2 02/03/2015   BASOPCT 0 02/03/2015    CMP:  Lab Results  Component Value Date   NA 136  11/17/2020   NA 140 04/28/2020   K 4.0 11/17/2020   CL 104 11/17/2020   CO2 27 11/17/2020   BUN 11 11/17/2020   BUN 10 04/28/2020   CREATININE 0.77 11/17/2020   CREATININE 0.75 08/13/2013   PROT 6.5 11/17/2020   PROT 6.2 04/28/2020   ALBUMIN 3.7 11/17/2020   ALBUMIN 4.2 04/28/2020   BILITOT 0.8 11/17/2020   BILITOT 0.7 04/28/2020   ALKPHOS 58 11/17/2020   AST 23 11/17/2020   ALT 19 11/17/2020  .   Total Discharge time is about 33 minutes  Roxan Hockey M.D on 11/18/2020 at 5:30 PM  Go to [email or web address] -  for contact info  [facility] - Office  [phone]

## 2020-11-18 NOTE — Consult Note (Addendum)
Cardiology Consultation:   Patient ID: [name] MRN: [id number]; DOB: 1928-12-09  Admit date: 11/17/2020 Date of Consult: 11/18/2020  PCP:  Dettinger, Fransisca Kaufmann, MD   [facility] Providers Cardiologist:  Rozann Lesches, MD  Electrophysiologist:  Thompson Grayer, MD  {   Patient Profile:   [name] is a 87 y.o. female with a past medical history paroxysmal atrial flutter (s/p ablation in 2003), SSS (s/p St. Jude PPM in 2003 with gen change in 2014), HTN, esophageal dysmotility and IBS who is being seen 11/18/2020 for the evaluation of chest pain at the request of Dr. Olevia Bowens  History of Present Illness:   [name] was last examined by Dr. Domenic Polite in 08/2020 and reported intermittent dyspnea and fatigue at that time, possibly felt to be due to paroxysmal atrial fibrillation. Also followed up with Dr. Rayann Heman in 10/2020 and her PPM was interrogated and functioning normally at that time with an AF burden < 1%.   In talking with the patient today, she reports feeling weak throughout this week and has experienced intermittent chest pressure and dyspnea. Her chest pressure has been more notable at night and lasted throughout the night on Tuesday. She tried working in her garden yesterday and did not have pain with this but says she did not feel well. She does report intermittent dyspnea and palpitations which resemble her prior atrial fibrillation and feels like this is occurring more frequently. She did have recurrent chest pain this morning while sitting in her chair and resolved within several minutes. Reported pain at the time of examination with deep breathing. No recent orthopnea, PND or pitting edema. Denies any recent viral illness but did have recent diarrhea by review of notes.   Initial labs showed WBC 4.1, Hgb 11.2, platelets 122, Na+ 136, K+ 4.0 and creatinine 0.77. LFT's WNL. Initial Hs troponin 10 with repeat of 12. COVID negative. CXR with no active disease. EKG  shows AV paced, HR 60.   Past Medical History:  Diagnosis Date   Arthritis    Atrial flutter ([place])    s/p RFA 2003   Breast cancer ([place])    1980's - lumpectomy only   Colon polyps    Diverticulosis    DVT, lower extremity ([place]) 1998 and 1999   Bilateral   Essential hypertension    Gallstones    GERD (gastroesophageal reflux disease)    Glaucoma    IBS (irritable bowel syndrome)    Mixed hyperlipidemia    Osteoporosis    Persistent atrial fibrillation ([place])    Pneumonia 2011   Sick sinus syndrome ([facility])    PPM ([facility])   Urinary tract infection    Varicose veins     Past Surgical History:  Procedure Laterality Date   BREAST LUMPECTOMY  1980's   Right breast    CARDIAC CATHETERIZATION  1999   Normal coronaries   Cataracts Bilateral    CHOLECYSTECTOMY     COLONOSCOPY W/ BIOPSIES AND POLYPECTOMY  03/2003, 05/2008, 02/20/2011   severe diverticulosis, tubulovillous adenoma polyp, internal and external hemorrhoids 2012: severe diverticulosis, 4 mm polyp, hemorrhoids   CYST REMOVAL TRUNK     under breast = on belly    DILATION AND CURETTAGE OF UTERUS     EYE SURGERY     cataracts - bilateral    Fractured wrist Left 2013   repair   INSERT / REPLACE / REMOVE PACEMAKER     KNEE ARTHROSCOPY WITH MEDIAL MENISECTOMY Right 10/21/2014   Procedure:  RIGHT KNEE ARTHROSCOPY WITH MEDIAL MENISECTOM,lateral menisectomy,synovectomy suprapatellar pouch;  Surgeon: Latanya Maudlin, MD;  Location: [facility];  Service: Orthopedics;  Laterality: Right;   PACEMAKER GENERATOR CHANGE  06/18/12   [facility] DR RF, Dr Rayann Heman   PACEMAKER INSERTION  2003   PERMANENT PACEMAKER GENERATOR CHANGE N/A 06/18/2012   Procedure: PERMANENT PACEMAKER GENERATOR CHANGE;  Surgeon: Thompson Grayer, MD;  Location: [facility] CATH LAB;  Service: Cardiovascular;  Laterality: N/A;   TOTAL KNEE ARTHROPLASTY  12/20/2011   Procedure: TOTAL KNEE ARTHROPLASTY;  Surgeon: Tobi Bastos, MD;  Location: [facility];  Service: Orthopedics;  Laterality:  Left;   TOTAL KNEE ARTHROPLASTY Right 02/10/2015   Procedure: TOTAL RIGHT KNEE ARTHROPLASTY;  Surgeon: Latanya Maudlin, MD;  Location: [facility];  Service: Orthopedics;  Laterality: Right;     Home Medications:  Prior to Admission medications   Medication Sig Start Date End Date Taking? Authorizing Provider  acetaminophen (TYLENOL) 500 MG tablet Take 1,000 mg by mouth every 6 (six) hours as needed for mild pain.   Yes [provider]  amiodarone (PACERONE) 200 MG tablet Take 200 mg by mouth daily.   Yes [provider]  calcium-vitamin D (OSCAL WITH D) 500-200 MG-UNIT tablet Take 1 tablet by mouth daily with breakfast.    Yes [provider]  cyanocobalamin (,VITAMIN B-12,) 1000 MCG/ML injection Inject 1 mL (1,000 mcg total) into the muscle every 30 (thirty) days. 05/31/20  Yes Dettinger, Fransisca Kaufmann, MD  ELIQUIS 5 MG TABS tablet TAKE ONE TABLET BY MOUTH TWICE DAILY 09/10/20  Yes Dettinger, Fransisca Kaufmann, MD  latanoprost (XALATAN) 0.005 % ophthalmic solution Place 1 drop into both eyes at bedtime.   Yes [provider]  melatonin 5 MG TABS Take 5 mg by mouth at bedtime as needed.   Yes [provider]  Multiple Vitamin (MULTIVITAMIN) tablet Take 1 tablet by mouth every morning.   Yes [provider]  nadolol (CORGARD) 40 MG tablet Take 1.5 tablets (60 mg total) by mouth daily. 12/26/19  Yes Dettinger, Fransisca Kaufmann, MD  nitroGLYCERIN (NITROSTAT) 0.4 MG SL tablet DISSOLVE 1 TAB UNDER TOUNGE FOR CHEST PAIN. MAY REPEAT EVERY 5 MINUTES FOR 3 DOSES. IF NO RELIEF CALL 911 OR GO TO ER Patient taking differently: Place 0.4 mg under the tongue every 5 (five) minutes as needed for chest pain. 09/10/20  Yes Satira Sark, MD  pantoprazole (PROTONIX) 40 MG tablet Take 1 tablet by mouth daily. 09/09/20  Yes [provider]  Wheat Dextrin (BENEFIBER) POWD Take 1 Dose by mouth daily.   Yes [provider]  docusate sodium (COLACE) 100 MG capsule Take 200 mg  by mouth at bedtime. Patient not taking: No sig reported    [provider]    Inpatient Medications: Scheduled Meds:  amiodarone  200 mg Oral Daily   apixaban  5 mg Oral BID   calcium-vitamin D  1 tablet Oral Q breakfast   latanoprost  1 drop Both Eyes QHS   nadolol  60 mg Oral Daily   pantoprazole  40 mg Oral Daily   Continuous Infusions:  sodium chloride 88 mL/hr at 11/18/20 0256   PRN Meds: acetaminophen **OR** acetaminophen, melatonin, nitroGLYCERIN  Allergies:    Allergies  Allergen Reactions   Iodinated Diagnostic Agents Hives, Itching and Rash    03/13/14: symptoms began within two hours of intrathecal injection (myelogram 03/11/14); on shoulders radiating down to stomach.  Relief with Benadryl.  Told patient she would need to pre-med  with Benadryl in the future before receiving this contrast.  Brita Romp, RN   Naproxen Hives, Itching and Rash    Social History:   Social History   Socioeconomic History   Marital status: Widowed    Spouse name: Not on file   Number of children: 5   Years of education: 58   Highest education level: 12th grade  Occupational History   Occupation: Retired    Comment: resturant / cafe   Tobacco Use   Smoking status: Never   Smokeless tobacco: Never  Vaping Use   Vaping Use: Never used  Substance and Sexual Activity   Alcohol use: No    Alcohol/week: 0.0 standard drinks   Drug use: No   Sexual activity: Not on file  Other Topics Concern   Not on file  Social History Narrative   Widowed  - 5 sons = 1 lives local    Retired.   Never smoker no alcohol   Social Determinants of Radio broadcast assistant Strain: Not on file  Food Insecurity: No Food Insecurity   Worried About Charity fundraiser in the Last Year: Never true   Ran Out of Food in the Last Year: Never true  Transportation Needs: No Transportation Needs   Lack of Transportation (Medical): No   Lack of Transportation (Non-Medical): No  Physical  Activity: Sufficiently Active   Days of Exercise per Week: 7 days   Minutes of Exercise per Session: 30 min  Stress: No Stress Concern Present   Feeling of Stress : Not at all  Social Connections: Moderately Isolated   Frequency of Communication with Friends and Family: More than three times a week   Frequency of Social Gatherings with Friends and Family: More than three times a week   Attends Religious Services: More than 4 times per year   Active Member of [employer] or Organizations: No   Attends Archivist Meetings: Never   Marital Status: Widowed  Human resources officer Violence: Not At Risk   Fear of Current or Ex-Partner: No   Emotionally Abused: No   Physically Abused: No   Sexually Abused: No    Family History:   Family History  Problem Relation Age of Onset   Throat cancer Father    Stroke Mother    Alzheimer's disease Mother    Alzheimer's disease Brother    Alzheimer's disease Sister    Colon cancer Maternal Grandfather    Stomach cancer Maternal Grandmother    Heart disease Paternal Grandmother    Heart attack Paternal Grandmother    Prostate cancer Son    Colon cancer Sister    Colon cancer Brother    Heart disease Brother    Atrial fibrillation Brother    Diabetes Brother    Dementia Brother    Parkinson's disease Brother    Prostate cancer Son      ROS:  Please see the history of present illness.   All other ROS reviewed and negative.     Physical Exam/Data:   Vitals:   11/17/20 2037 11/17/20 2116 11/18/20 0150 11/18/20 0545  BP: (!) 162/91 (!) 144/96 (!) 151/70 140/66  Pulse: 66 87 64 66  Resp: _0 Temp: 98 F (36.7 C) 98.2 F (36.8 C) 97.9 F (36.6 C) 97.6 F (36.4 C)  TempSrc: Oral  Oral   SpO2: 100% 100% 96% 95%  Weight:  75.9 kg    Height:  _1  (1.6 m)  Intake/Output Summary (Last 24 hours) at 11/18/2020 1045 Last data filed at 11/18/2020 0900 Gross per 24 hour  Intake 595.21 ml  Output --  Net 595.21 ml   Last 3  Weights 11/17/2020 11/17/2020 10/22/2020  Weight (lbs) 167 lb 5.3 oz 167 lb 166 lb  Weight (kg) 75.9 kg 75.751 kg 75.297 kg     Body mass index is 29.64 kg/m.  General: Pleasant elderly female appearing in no acute distress. HEENT: normal Lymph: no adenopathy Neck: no JVD Endocrine:  No thryomegaly Vascular: No carotid bruits; FA pulses 2+ bilaterally without bruits  Cardiac:  normal S1, S2; RRR; no murmur. No friction rub.  Lungs:  clear to auscultation bilaterally, no wheezing, rhonchi or rales  Abd: soft, nontender, no hepatomegaly  Ext: no lower extremity edema Musculoskeletal:  No deformities, BUE and BLE strength normal and equal Skin: warm and dry  Neuro:  CNs 2-12 intact, no focal abnormalities noted Psych:  Normal affect   EKG:  The EKG was personally reviewed and demonstrates: AV paced, HR 60. Telemetry:  Telemetry was personally reviewed and demonstrates: AV paced rhythm, HR in 60's.   Relevant CV Studies:  NST: 04/2014 IMPRESSION: 1. No scintigraphic evidence of prior infarction or pharmacologically induced ischemia.   2. Normal left ventricular wall motion.   3. Left ventricular ejection fraction 68%   4. Low-risk stress test findings*.  Laboratory Data:  High Sensitivity Troponin:   Recent Labs  Lab 11/17/20 1610 11/17/20 1917  TROPONINIHS 10 12     Chemistry Recent Labs  Lab 11/17/20 1610  NA 136  K 4.0  CL 104  CO2 27  GLUCOSE 97  BUN 11  CREATININE 0.77  CALCIUM 8.7*  GFRNONAA >60  ANIONGAP 5    Recent Labs  Lab 11/17/20 1917  PROT 6.5  ALBUMIN 3.7  AST 23  ALT 19  ALKPHOS 58  BILITOT 0.8   Hematology Recent Labs  Lab 11/17/20 1610 11/18/20 0502  WBC 4.1 3.8*  RBC 3.25* 3.32*  3.27*  HGB 11.2* 11.5*  HCT 34.6* 34.8*  MCV 106.5* 104.8*  MCH 34.5* 34.6*  MCHC 32.4 33.0  RDW 13.7 13.6  PLT 122* 117*   BNPNo results for input(s): BNP, PROBNP in the last 168 hours.  DDimer No results for input(s): DDIMER in the last 168  hours.   Radiology/Studies:  DG Chest Portable 1 View  Result Date: 11/17/2020 CLINICAL DATA:  Shortness of breath, chest pain EXAM: PORTABLE CHEST 1 VIEW COMPARISON:  12/03/2018 FINDINGS: Right pacer remains in place., No confluent opacities unchanged. Cardiomegaly, effusions or edema. No acute bony abnormality. IMPRESSION: Cardiomegaly.  No active disease. Electronically Signed   By: Rolm Baptise M.D.   On: 11/17/2020 17:08     Assessment and Plan:   Chest Pain with Atypical Features - Her episodes of chest pain have atypical qualities as they occur at night or can occur with deep breathing. Not specifically associated with exertion. Does have intermittent dyspnea and palpitations that she feels is her atrial fibrillation but unclear etiology as she has been in NSR.  - Initial Hs troponin 10 with repeat of 12. EKG shows AV paced, HR 60. Echocardiogram is pending to assess LV function and wall motion. Given her pleuritic components, will check ESR and CRP. If echo is reassuring and inflammatory markers are negative, would consider an outpatient Lexiscan Myoview for ischemic evaluation given the time-frame since her last study.  2. Paroxysmal Atrial Flutter - She is s/p ablation in  2003. Her recent device interrogation showed < 1% AF burden. She does report intermittent dyspnea and palpitations but she seems to be in a NSR when these episodes occur. Remains on Amiodarone 250m daily and Nadolol 6877mdaily.  - On Eliquis 77m48mID for anticoagulation which is the correct dose given her age, weight and renal function.   3. SSS - She is s/p St. Jude PPM in 2003 with gen change in 2014. Followed by Dr. AllRayann Heman an outpatient.   4. HTN - BP has been variable from 117/70 - 162/91 since admission, at 140/66 on most recent check. She just received her AM medications. Continue to follow.    Risk Assessment/Risk Scores:     HEAR Score (for undifferentiated chest pain):  HEAR Score:  5   CHA2DS2-VASc Score = 4  This indicates a 4.8% annual risk of stroke. The patient's score is based upon: CHF History: No HTN History: No Diabetes History: No Stroke History: No Vascular Disease History: Yes Age Score: 2 Gender Score: 1    For questions or updates, please contact [facility] consult [email or web address] for contact info under    Signed, [name] HeritageA-C  11/18/2020 10:45 AM    Attending note:  Patient seen and examined.  She is well-known to me, I reviewed the case with [name] agree with her above findings.  Ms. CarLabineesents with recurring unusual feelings in her chest like "something is moving" but not necessarily with associated elevated heart rate or chest pain.  She has a more constant bandlike feeling at her lower rib cage, also short of breath at times and with recurring reflux symptoms.  She does have some degree of esophageal dysmotility but with relatively recent GI work-up.  Device interrogation shows relatively low AF burden less than 1% as of May.  She appears comfortable at rest.  Afebrile, heart rate in the 60s with a paced rhythm by telemetry.  Systolic in the [id number] are clear.  Cardiac exam reveals RRR with 1/6 systolic murmur.  Pertinent lab work includes potassium 4.0, creatinine 0.77, normal LFTs, high-sensitivity troponin I levels of 10 and 12, hemoglobin 11.5, platelets 117.  I personally reviewed her ECG from June 8 which shows dual-chamber pacing.  Chest x-ray reports cardiomegaly with no active disease.  Atypical thoracic discomfort with intermittent shortness of breath.  High-sensitivity troponin I levels are normal.  ECG shows dual-chamber pacing and based on her last device check she does not have increasing AF burden.  Could be esophageal component to symptoms.  She has not had a recent echocardiogram, this will be arranged to ensure no interval development of cardiomyopathy.  If this is not the case,  [facility] work-[facility] was in 2015.  SamSatira Sark.D., F.A.C.C.

## 2020-11-18 NOTE — Care Management Obs Status (Signed)
[facility] NOTIFICATION   Patient Details  Name: [name] MRN: [id number] Date of Birth: April 20, 1929   Medicare Observation Status Notification Given:  Yes    Tommy Medal 11/18/2020, 4:38 PM

## 2020-11-19 ENCOUNTER — Other Ambulatory Visit: Payer: Self-pay

## 2020-11-19 DIAGNOSIS — R079 Chest pain, unspecified: Secondary | ICD-10-CM

## 2020-11-19 NOTE — Progress Notes (Signed)
Ahmed Prima, Fransisco Hertz, PA-C  P [facility] Scheduling This patient needs an outpatient Lexiscan Myoview for chest pain per Dr.McDowell.  Orders placed and has been fwd to scheduling pool.

## 2020-11-23 ENCOUNTER — Encounter: Payer: Self-pay | Admitting: Family Medicine

## 2020-11-23 ENCOUNTER — Ambulatory Visit (INDEPENDENT_AMBULATORY_CARE_PROVIDER_SITE_OTHER): Payer: Medicare Other | Admitting: Family Medicine

## 2020-11-23 DIAGNOSIS — R059 Cough, unspecified: Secondary | ICD-10-CM | POA: Diagnosis not present

## 2020-11-23 DIAGNOSIS — J069 Acute upper respiratory infection, unspecified: Secondary | ICD-10-CM

## 2020-11-23 LAB — VERITOR FLU A/B WAIVED
Influenza A: NEGATIVE
Influenza B: NEGATIVE

## 2020-11-23 NOTE — Progress Notes (Signed)
Virtual Visit via Telephone Note  I connected with [name] on 11/23/20 at 1:33 PM by telephone and verified that I am speaking with the correct person using two identifiers. [name] B Arnott is currently located at home and nobody is currently with her during this visit. The provider, Loman Brooklyn, FNP is located in their office at time of visit.  I discussed the limitations, risks, security and privacy concerns of performing an evaluation and management service by telephone and the availability of in person appointments. I also discussed with the patient that there may be a patient responsible charge related to this service. The patient expressed understanding and agreed to proceed.  Subjective: PCP: Dettinger, Fransisca Kaufmann, MD  Chief Complaint  Patient presents with   URI   Patient complains of cough, head congestion, headache, runny nose, sneezing, and postnasal drainage. Onset of symptoms was 2 days ago, gradually worsening since that time. She is drinking plenty of fluids. Evaluation to date: none. Treatment to date:  Tylenol .  She does not smoke.    ROS: Per HPI  Current Outpatient Medications:    acetaminophen (TYLENOL) 500 MG tablet, Take 1,000 mg by mouth every 6 (six) hours as needed for mild pain., Disp: , Rfl:    amiodarone (PACERONE) 200 MG tablet, Take 200 mg by mouth daily., Disp: , Rfl:    calcium-vitamin D (OSCAL WITH D) 500-200 MG-UNIT tablet, Take 1 tablet by mouth daily with breakfast. , Disp: , Rfl:    cyanocobalamin (,VITAMIN B-12,) 1000 MCG/ML injection, Inject 1 mL (1,000 mcg total) into the muscle every 30 (thirty) days., Disp: 3 mL, Rfl: 1   ELIQUIS 5 MG TABS tablet, TAKE ONE TABLET BY MOUTH TWICE DAILY, Disp: 180 tablet, Rfl: 0   furosemide (LASIX) 20 MG tablet, Take 1 tablet (20 mg total) by mouth daily., Disp: 30 tablet, Rfl: 1   latanoprost (XALATAN) 0.005 % ophthalmic solution, Place 1 drop into both eyes at bedtime., Disp: , Rfl:     melatonin 5 MG TABS, Take 5 mg by mouth at bedtime as needed., Disp: , Rfl:    Multiple Vitamin (MULTIVITAMIN) tablet, Take 1 tablet by mouth every morning., Disp: , Rfl:    nadolol (CORGARD) 40 MG tablet, Take 1.5 tablets (60 mg total) by mouth daily., Disp: 135 tablet, Rfl: 3   nitroGLYCERIN (NITROSTAT) 0.4 MG SL tablet, DISSOLVE 1 TAB UNDER TOUNGE FOR CHEST PAIN. MAY REPEAT EVERY 5 MINUTES FOR 3 DOSES. IF NO RELIEF CALL 911 OR GO TO ER, Disp: 25 tablet, Rfl: 3   pantoprazole (PROTONIX) 40 MG tablet, Take 1 tablet by mouth daily., Disp: , Rfl:    Wheat Dextrin (BENEFIBER) POWD, Take 1 Dose by mouth daily., Disp: , Rfl:  No current facility-administered medications for this visit.  Facility-Administered Medications Ordered in Other Visits:    bupivacaine liposome (EXPAREL) 1.3 % injection 266 mg, 20 mL, Infiltration, Once, [place], Amber, PA-C  Allergies  Allergen Reactions   Iodinated Diagnostic Agents Hives, Itching and Rash    03/13/14: symptoms began within two hours of intrathecal injection (myelogram 03/11/14); on shoulders radiating down to stomach.  Relief with Benadryl.  Told patient she would need to pre-med with Benadryl in the future before receiving this contrast.  Brita Romp, RN   Naproxen Hives, Itching and Rash   Past Medical History:  Diagnosis Date   Arthritis    Atrial flutter [facility])    s/p RFA 2003   Breast cancer ([place])  1980's - lumpectomy only   Colon polyps    Diverticulosis    DVT, lower extremity ([place]) 1998 and 1999   Bilateral   Essential hypertension    Gallstones    GERD (gastroesophageal reflux disease)    Glaucoma    IBS (irritable bowel syndrome)    Mixed hyperlipidemia    Osteoporosis    Persistent atrial fibrillation ([place])    Pneumonia 2011   Sick sinus syndrome ([facility])    PPM ([facility])   Urinary tract infection    Varicose veins     Observations/Objective: A&O  No respiratory distress or wheezing audible over the phone Mood, judgement,  and thought processes all WNL  Assessment and Plan: 1. Viral URI Discussed symptom management and expected duration of viral illnesses.  2. Cough - Novel Coronavirus, NAA ([facility]); Future - Veritor Flu A/B Waived; Future   Follow Up Instructions:  I discussed the assessment and treatment plan with the patient. The patient was provided an opportunity to ask questions and all were answered. The patient agreed with the plan and demonstrated an understanding of the instructions.   The patient was advised to call back or seek an in-person evaluation if the symptoms worsen or if the condition fails to improve as anticipated.  The above assessment and management plan was discussed with the patient. The patient verbalized understanding of and has agreed to the management plan. Patient is aware to call the clinic if symptoms persist or worsen. Patient is aware when to return to the clinic for a follow-up visit. Patient educated on when it is appropriate to go to the emergency department.   Time call ended: 1:44 PM  I provided 11 minutes of non-face-to-face time during this encounter.  Hendricks Limes, MSN, APRN, FNP-C [facility] 11/23/20

## 2020-11-23 NOTE — Addendum Note (Signed)
Addended by: Collier Bullock on: 11/23/2020 03:16 PM   Modules accepted: Orders

## 2020-11-24 LAB — NOVEL CORONAVIRUS, NAA: SARS-CoV-2, NAA: DETECTED — AB

## 2020-11-24 LAB — SARS-COV-2, NAA 2 DAY TAT

## 2020-11-26 ENCOUNTER — Encounter (HOSPITAL_COMMUNITY): Payer: Medicare Other

## 2020-11-29 ENCOUNTER — Encounter: Payer: Self-pay | Admitting: Nurse Practitioner

## 2020-11-29 ENCOUNTER — Ambulatory Visit (INDEPENDENT_AMBULATORY_CARE_PROVIDER_SITE_OTHER): Payer: Medicare Other | Admitting: Nurse Practitioner

## 2020-11-29 DIAGNOSIS — U071 COVID-19: Secondary | ICD-10-CM

## 2020-11-29 MED ORDER — AMOXICILLIN-POT CLAVULANATE 875-125 MG PO TABS: 1.0000 | ORAL_TABLET | Freq: Two times a day (BID) | ORAL | 0 refills | Status: DC

## 2020-11-29 MED ORDER — PREDNISONE 10 MG (21) PO TBPK: ORAL_TABLET | ORAL | 0 refills | Status: DC

## 2020-11-29 NOTE — Progress Notes (Signed)
   Virtual Visit  Note Due to COVID-19 pandemic this visit was conducted virtually. This visit type was conducted due to national recommendations for restrictions regarding the COVID-19 Pandemic (e.g. social distancing, sheltering in place) in an effort to limit this patient's exposure and mitigate transmission in our community. All issues noted in this document were discussed and addressed.  A physical exam was not performed with this format.  I connected with [name] on 11/29/20 at 9:03 am  by telephone and verified that I am speaking with the correct person using two identifiers. [name] is currently located at home during visit. The provider, Ivy Lynn, NP is located in their office at time of visit.  I discussed the limitations, risks, security and privacy concerns of performing an evaluation and management service by telephone and the availability of in person appointments. I also discussed with the patient that there may be a patient responsible charge related to this service. The patient expressed understanding and agreed to proceed.   History and Present Illness:  URI  This is a new problem. The current episode started in the past 7 days. The problem has been gradually worsening. There has been no fever. Associated symptoms include coughing, headaches, sneezing and a sore throat. Pertinent negatives include no abdominal pain or rash. She has tried nothing for the symptoms.     Review of Systems  Constitutional:  Positive for malaise/fatigue. Negative for chills and fever.  HENT:  Positive for sneezing and sore throat.   Respiratory:  Positive for cough.   Gastrointestinal:  Negative for abdominal pain.  Skin:  Negative for rash.  Neurological:  Positive for headaches.  All other systems reviewed and are negative.   Observations/Objective: Televisit patient not in distress.  Assessment and Plan: Patient positive for COVID-19 last week. Patient  unable to take Paxlovid due to current intake of Eloquis. Symptoms of cough, congestion, headache and runny nose.  No nausea or fever.  Advised patient to take medication as prescribed, increase hydration, prednisone taper, Augmentin 875-125 mg tablet by mouth. Rx sent to pharmacy. Education provided to patient over the phone.  Follow Up Instructions: Follow-up with worsening unresolved symptoms.    I discussed the assessment and treatment plan with the patient. The patient was provided an opportunity to ask questions and all were answered. The patient agreed with the plan and demonstrated an understanding of the instructions.   The patient was advised to call back or seek an in-person evaluation if the symptoms worsen or if the condition fails to improve as anticipated.  The above assessment and management plan was discussed with the patient. The patient verbalized understanding of and has agreed to the management plan. Patient is aware to call the clinic if symptoms persist or worsen. Patient is aware when to return to the clinic for a follow-up visit. Patient educated on when it is appropriate to go to the emergency department.   Time call ended: 9:14 AM  I provided 11 minutes of  non face-to-face time during this encounter.    Ivy Lynn, NP

## 2020-11-29 NOTE — Assessment & Plan Note (Signed)
Patient positive for COVID-19 last week.  Patient is unable to take Paxlovid due to currently on Eloquis. symptoms of cough, congestion, headache and runny nose.  No nausea or fever.  Advised patient to take medication as prescribed, increase hydration, prednisone taper, Augmentin 875-125 mg tablet by mouth. Rx sent to pharmacy. Education provided to patient over the phone.

## 2020-12-08 ENCOUNTER — Other Ambulatory Visit: Payer: Self-pay | Admitting: Family Medicine

## 2020-12-15 ENCOUNTER — Encounter (HOSPITAL_COMMUNITY): Payer: Self-pay

## 2020-12-15 ENCOUNTER — Encounter (HOSPITAL_COMMUNITY)
Admission: RE | Admit: 2020-12-15 | Discharge: 2020-12-15 | Disposition: A | Discharge status: Home / Self Care | Payer: Medicare Other | Source: Ambulatory Visit | Attending: Cardiology | Admitting: Cardiology

## 2020-12-15 ENCOUNTER — Encounter (HOSPITAL_BASED_OUTPATIENT_CLINIC_OR_DEPARTMENT_OTHER)
Admission: RE | Admit: 2020-12-15 | Discharge: 2020-12-15 | Disposition: A | Discharge status: Home / Self Care | Payer: Medicare Other | Source: Ambulatory Visit | Attending: Cardiology | Admitting: Cardiology

## 2020-12-15 DIAGNOSIS — R079 Chest pain, unspecified: Secondary | ICD-10-CM | POA: Diagnosis not present

## 2020-12-15 LAB — NM MYOCAR MULTI W/SPECT W/WALL MOTION / EF
LV dias vol: 85 mL (ref 46–106)
LV sys vol: 34 mL
Peak HR: 63 {beats}/min
RATE: 0.37
Rest HR: 60 {beats}/min
SDS: 1
SRS: 3
SSS: 4
TID: 1

## 2020-12-15 MED ORDER — SODIUM CHLORIDE FLUSH 0.9 % IV SOLN
INTRAVENOUS | Status: AC
  Administered 2020-12-15: 10 mL via INTRAVENOUS
  Filled 2020-12-15: qty 10

## 2020-12-15 MED ORDER — TECHNETIUM TC 99M TETROFOSMIN IV KIT
10.0000 | PACK | Freq: Once | INTRAVENOUS | Status: AC | PRN
  Administered 2020-12-15: 10.8 via INTRAVENOUS

## 2020-12-15 MED ORDER — REGADENOSON 0.4 MG/5ML IV SOLN
INTRAVENOUS | Status: AC
  Administered 2020-12-15: 0.4 mg via INTRAVENOUS
  Filled 2020-12-15: qty 5

## 2020-12-15 MED ORDER — TECHNETIUM TC 99M TETROFOSMIN IV KIT
30.0000 | PACK | Freq: Once | INTRAVENOUS | Status: AC | PRN
  Administered 2020-12-15: 32 via INTRAVENOUS

## 2020-12-22 NOTE — Progress Notes (Signed)
Cardiology Office Note    Date:  12/27/2020   ID:  [name], DOB June 16, 1928, MRN [id number]   PCP:  Dettinger, Elige Radon, MD   [facility]  Cardiologist:  Nona Dell, MD   Advanced Practice Provider:  No care team member to display Electrophysiologist:  Hillis Range, MD   [phone]   No chief complaint on file.   History of Present Illness:  [name] is a 87 y.o. female with history of paroxysmal atrial flutter status post ablation in 2003, on amiodarone nadolol and Eliquis sick sinus syndrome [facility] pacemaker 2003 generator change in 2014, hypertension.  Patient was seen in the hospital 11/18/2020 with atypical chest pain.  NST was normal and echo with normal LVEF grade 2 DD.  Patient comes in for f/u. Denies chest pain or dyspnea if she takes her lasix everyday.  Lives alone and trying to cut back on her sodium.    Past Medical History:  Diagnosis Date   Arthritis    Atrial flutter (HCC)    s/p RFA 2003   Breast cancer (HCC)    1980's - lumpectomy only   Colon polyps    Diverticulosis    DVT, lower extremity (HCC) 1998 and 1999   Bilateral   Essential hypertension    Gallstones    GERD (gastroesophageal reflux disease)    Glaucoma    IBS (irritable bowel syndrome)    Mixed hyperlipidemia    Osteoporosis    Persistent atrial fibrillation (HCC)    Pneumonia 2011   Sick sinus syndrome (HCC)    PPM ([facility])   Urinary tract infection    Varicose veins     Past Surgical History:  Procedure Laterality Date   BREAST LUMPECTOMY  1980's   Right breast    CARDIAC CATHETERIZATION  1999   Normal coronaries   Cataracts Bilateral    CHOLECYSTECTOMY     COLONOSCOPY W/ BIOPSIES AND POLYPECTOMY  03/2003, 05/2008, 02/20/2011   severe diverticulosis, tubulovillous adenoma polyp, internal and external hemorrhoids 2012: severe diverticulosis, 4 mm polyp, hemorrhoids   CYST REMOVAL TRUNK     under breast = on belly     DILATION AND CURETTAGE OF UTERUS     EYE SURGERY     cataracts - bilateral    Fractured wrist Left 2013   repair   INSERT / REPLACE / REMOVE PACEMAKER     KNEE ARTHROSCOPY WITH MEDIAL MENISECTOMY Right 10/21/2014   Procedure: RIGHT KNEE ARTHROSCOPY WITH MEDIAL MENISECTOM,lateral menisectomy,synovectomy suprapatellar pouch;  Surgeon: Ranee Gosselin, MD;  Location: [facility];  Service: Orthopedics;  Laterality: Right;   PACEMAKER GENERATOR CHANGE  06/18/12   [facility] DR RF, Dr Johney Frame   PACEMAKER INSERTION  2003   PERMANENT PACEMAKER GENERATOR CHANGE N/A 06/18/2012   Procedure: PERMANENT PACEMAKER GENERATOR CHANGE;  Surgeon: Hillis Range, MD;  Location: [facility] CATH LAB;  Service: Cardiovascular;  Laterality: N/A;   TOTAL KNEE ARTHROPLASTY  12/20/2011   Procedure: TOTAL KNEE ARTHROPLASTY;  Surgeon: Jacki Cones, MD;  Location: [facility];  Service: Orthopedics;  Laterality: Left;   TOTAL KNEE ARTHROPLASTY Right 02/10/2015   Procedure: TOTAL RIGHT KNEE ARTHROPLASTY;  Surgeon: Ranee Gosselin, MD;  Location: [facility];  Service: Orthopedics;  Laterality: Right;    Current Medications: Current Meds  Medication Sig   acetaminophen (TYLENOL) 500 MG tablet Take 1,000 mg by mouth every 6 (six) hours as needed for mild pain.   amiodarone (PACERONE) 200 MG tablet  Take 200 mg by mouth daily.   apixaban (ELIQUIS) 5 MG TABS tablet TAKE ONE TABLET BY MOUTH TWICE DAILY (NEEDS TO BE SEEN BEFORE NEXT REFILL)   calcium-vitamin D (OSCAL WITH D) 500-200 MG-UNIT tablet Take 1 tablet by mouth daily with breakfast.    cyanocobalamin (,VITAMIN B-12,) 1000 MCG/ML injection Inject 1 mL (1,000 mcg total) into the muscle every 30 (thirty) days.   furosemide (LASIX) 20 MG tablet Take 1 tablet (20 mg total) by mouth daily.   latanoprost (XALATAN) 0.005 % ophthalmic solution Place 1 drop into both eyes at bedtime.   melatonin 5 MG TABS Take 5 mg by mouth at bedtime as needed.   Multiple Vitamin (MULTIVITAMIN) tablet Take 1 tablet by  mouth every morning.   nadolol (CORGARD) 40 MG tablet Take 1.5 tablets (60 mg total) by mouth daily. (NEEDS TO BE SEEN BEFORE NEXT REFILL)   nitroGLYCERIN (NITROSTAT) 0.4 MG SL tablet DISSOLVE 1 TAB UNDER TOUNGE FOR CHEST PAIN. MAY REPEAT EVERY 5 MINUTES FOR 3 DOSES. IF NO RELIEF CALL 911 OR GO TO ER   pantoprazole (PROTONIX) 40 MG tablet Take 1 tablet by mouth daily.   Wheat Dextrin (BENEFIBER) POWD Take 1 Dose by mouth daily.     Allergies:   Iodinated diagnostic agents and Naproxen   Social History   Socioeconomic History   Marital status: Widowed    Spouse name: Not on file   Number of children: 5   Years of education: 70   Highest education level: 12th grade  Occupational History   Occupation: Retired    Comment: resturant / cafe   Tobacco Use   Smoking status: Never   Smokeless tobacco: Never  Vaping Use   Vaping Use: Never used  Substance and Sexual Activity   Alcohol use: No    Alcohol/week: 0.0 standard drinks   Drug use: No   Sexual activity: Not on file  Other Topics Concern   Not on file  Social History Narrative   Widowed  - 5 sons = 1 lives local    Retired.   Never smoker no alcohol   Social Determinants of Radio broadcast assistant Strain: Not on file  Food Insecurity: No Food Insecurity   Worried About Charity fundraiser in the Last Year: Never true   Ran Out of Food in the Last Year: Never true  Transportation Needs: No Transportation Needs   Lack of Transportation (Medical): No   Lack of Transportation (Non-Medical): No  Physical Activity: Sufficiently Active   Days of Exercise per Week: 7 days   Minutes of Exercise per Session: 30 min  Stress: No Stress Concern Present   Feeling of Stress : Not at all  Social Connections: Moderately Isolated   Frequency of Communication with Friends and Family: More than three times a week   Frequency of Social Gatherings with Friends and Family: More than three times a week   Attends Religious Services:  More than 4 times per year   Active Member of [employer] or Organizations: No   Attends Archivist Meetings: Never   Marital Status: Widowed     Family History:  The patient's family history includes Alzheimer's disease in her brother, mother, and sister; Atrial fibrillation in her brother; Colon cancer in her brother, maternal grandfather, and sister; Dementia in her brother; Diabetes in her brother; Heart attack in her paternal grandmother; Heart disease in her brother and paternal grandmother; Parkinson's disease in her brother; Prostate cancer in  her son and son; Stomach cancer in her maternal grandmother; Stroke in her mother; Throat cancer in her father.   ROS:   Please see the history of present illness.    ROS All other systems reviewed and are negative.   PHYSICAL EXAM:   VS:  BP 118/78   Pulse 66   Ht $R'5\' 3"'TQ$  (1.6 m)   Wt 165 lb (74.8 kg)   SpO2 97%   BMI 29.23 kg/m   Physical Exam  GEN: Well nourished, well developed, in no acute distress  Neck: no JVD, carotid bruits, or masses Cardiac:RRR; positive S4, 1/6 systolic murmur the left sternal border Respiratory:  clear to auscultation bilaterally, normal work of breathing GI: soft, nontender, nondistended, + BS Ext: without cyanosis, clubbing, or edema, Good distal pulses bilaterally Neuro:  Alert and Oriented x 3 Psych: euthymic mood, full affect  Wt Readings from Last 3 Encounters:  12/27/20 165 lb (74.8 kg)  11/17/20 167 lb 5.3 oz (75.9 kg)  10/22/20 166 lb (75.3 kg)      Studies/Labs Reviewed:   EKG:  EKG is not ordered today.    Recent Labs: 04/28/2020: BNP 327.1; TSH 2.050 11/17/2020: ALT 19; BUN 11; Creatinine, Ser 0.77; Potassium 4.0; Sodium 136 11/18/2020: Hemoglobin 11.5; Platelets 117   Lipid Panel    Component Value Date/Time   CHOL 173 04/28/2020 1120   TRIG 55 04/28/2020 1120   HDL 54 04/28/2020 1120   CHOLHDL 3.2 04/28/2020 1120   LDLCALC 108 (H) 04/28/2020 1120    Additional studies/  records that were reviewed today include:  Echo 11/18/20 IMPRESSIONS     1. Left ventricular ejection fraction, by estimation, is 55 to 60%. The  left ventricle has normal function. The left ventricle has no regional  wall motion abnormalities. There is moderate left ventricular hypertrophy.  Left ventricular diastolic  parameters are consistent with Grade II diastolic dysfunction  (pseudonormalization).   2. Right ventricular systolic function is normal. The right ventricular  size is normal. There is severely elevated pulmonary artery systolic  pressure. The estimated right ventricular systolic pressure is 46.6 mmHg.   3. Left atrial size was mildly dilated.   4. Right atrial size was mildly dilated.   5. The mitral valve is degenerative. Mild mitral valve regurgitation.   6. Tricuspid valve regurgitation is moderate.   7. The aortic valve is tricuspid. There is mild calcification of the  aortic valve. Aortic valve regurgitation is not visualized. Mild to  moderate aortic valve sclerosis/calcification is present, without any  evidence of aortic stenosis. Aortic valve mean   gradient measures 7.0 mmHg.   8. The inferior vena cava is dilated in size with >50% respiratory  variability, suggesting right atrial pressure of 8 mmHg.   Echo 11/18/20 There was no ST segment deviation noted during stress. The study is normal. This is a low risk study. The left ventricular ejection fraction is normal (55-65%).   Normal resting and stress perfusion. No ischemia or infarction EF 60 % Baseline ECG AV pacing  Risk Assessment/Calculations:    CHA2DS2-VASc Score = 4  This indicates a 4.8% annual risk of stroke. The patient's score is based upon: CHF History: No HTN History: No Diabetes History: No Stroke History: No Vascular Disease History: Yes Age Score: 2 Gender Score: 1        ASSESSMENT:    1. Chest pain, unspecified type   2. Paroxysmal atrial fibrillation (HCC)   3. Sick  sinus syndrome ([place])  4. Essential hypertension      PLAN:  In order of problems listed above:  Chest pain felt to be musculoskeletal with normal NST 11/18/2020 echo with normal LVEF 55 to 60%-no further chest pain she takes her Lasix every day  Chronic diastolic CHF compensated on low-dose Lasix.  Check be met today  Paroxysmal atrial flutter status post ablation 2003 on amiodarone and nadolol and Eliquis-no bleeding problems on Eliquis labs stable in the hospital  SSS status post [facility] pacemaker 2003 generator change 2014  Hypertension blood pressure controlled  Shared Decision Making/Informed Consent        Medication Adjustments/Labs and Tests Ordered: Current medicines are reviewed at length with the patient today.  Concerns regarding medicines are outlined above.  Medication changes, Labs and Tests ordered today are listed in the Patient Instructions below. Patient Instructions  Medication Instructions:   Your physician recommends that you continue on your current medications as directed. Please refer to the Current Medication list given to you today.  *If you need a refill on your cardiac medications before your next appointment, please call your pharmacy*   Lab Work:  BMET Today  If you have labs (blood work) drawn today and your tests are completely normal, you will receive your results only by: [place] (if you have MyChart) OR A paper copy in the mail If you have any lab test that is abnormal or we need to change your treatment, we will call you to review the results.   Testing/Procedures: None today    Follow-Up: At [facility], you and your health needs are our priority.  As part of our continuing mission to provide you with exceptional heart care, we have created designated Provider Care Teams.  These Care Teams include your primary Cardiologist (physician) and Advanced Practice Providers (APPs -  Physician Assistants and Nurse  Practitioners) who all work together to provide you with the care you need, when you need it.  We recommend signing up for the patient portal called "MyChart".  Sign up information is provided on this After Visit Summary.  MyChart is used to connect with patients for Virtual Visits (Telemedicine).  Patients are able to view lab/test results, encounter notes, upcoming appointments, etc.  Non-urgent messages can be sent to your provider as well.   To learn more about what you can do with MyChart, go to [email or web address].    Your next appointment:  Keep September appointment   Signed, Ermalinda Barrios, PA-C  12/27/2020 12:50 PM    [facility] [place], [place],   [address] Phone: [phone]; Fax: [phone]

## 2020-12-25 LAB — CUP PACEART REMOTE DEVICE CHECK
Battery Remaining Longevity: 6 mo
Battery Remaining Percentage: 6 %
Battery Voltage: 2.77 V
Brady Statistic AP VP Percent: 99 %
Brady Statistic AP VS Percent: 1 %
Brady Statistic AS VP Percent: 1 %
Brady Statistic AS VS Percent: 1 %
Brady Statistic RA Percent Paced: 99 %
Brady Statistic RV Percent Paced: 99 %
Date Time Interrogation Session: 20220716201647
Implantable Lead Implant Date: 20031028
Implantable Lead Implant Date: 20031028
Implantable Lead Location: 753859
Implantable Lead Location: 753860
Implantable Pulse Generator Implant Date: 20140107
Lead Channel Impedance Value: 330 Ohm
Lead Channel Impedance Value: 440 Ohm
Lead Channel Pacing Threshold Amplitude: 0.5 V
Lead Channel Pacing Threshold Amplitude: 0.75 V
Lead Channel Pacing Threshold Pulse Width: 0.5 ms
Lead Channel Pacing Threshold Pulse Width: 0.5 ms
Lead Channel Sensing Intrinsic Amplitude: 0.2 mV
Lead Channel Sensing Intrinsic Amplitude: 9.9 mV
Lead Channel Setting Pacing Amplitude: 2 V
Lead Channel Setting Pacing Amplitude: 2.5 V
Lead Channel Setting Pacing Pulse Width: 0.5 ms
Lead Channel Setting Sensing Sensitivity: 2 mV
Pulse Gen Model: 2210
Pulse Gen Serial Number: 7438567

## 2020-12-27 ENCOUNTER — Ambulatory Visit: Payer: Medicare Other | Admitting: Physician Assistant

## 2020-12-27 ENCOUNTER — Other Ambulatory Visit (HOSPITAL_COMMUNITY)
Admission: RE | Admit: 2020-12-27 | Discharge: 2020-12-27 | Disposition: A | Discharge status: Home / Self Care | Payer: Medicare Other | Source: Ambulatory Visit | Attending: Physician Assistant | Admitting: Physician Assistant

## 2020-12-27 ENCOUNTER — Encounter: Payer: Self-pay | Admitting: Physician Assistant

## 2020-12-27 ENCOUNTER — Ambulatory Visit (INDEPENDENT_AMBULATORY_CARE_PROVIDER_SITE_OTHER): Payer: Medicare Other

## 2020-12-27 ENCOUNTER — Other Ambulatory Visit: Payer: Self-pay

## 2020-12-27 VITALS — BP 118/78 | HR 66 | Ht 63.0 in | Wt 165.0 lb

## 2020-12-27 DIAGNOSIS — I495 Sick sinus syndrome: Secondary | ICD-10-CM | POA: Diagnosis not present

## 2020-12-27 DIAGNOSIS — I480 Paroxysmal atrial fibrillation: Secondary | ICD-10-CM | POA: Insufficient documentation

## 2020-12-27 DIAGNOSIS — I10 Essential (primary) hypertension: Secondary | ICD-10-CM

## 2020-12-27 DIAGNOSIS — R079 Chest pain, unspecified: Secondary | ICD-10-CM | POA: Diagnosis not present

## 2020-12-27 LAB — BASIC METABOLIC PANEL
Anion gap: 6 (ref 5–15)
BUN: 20 mg/dL (ref 8–23)
CO2: 29 mmol/L (ref 22–32)
Calcium: 9.3 mg/dL (ref 8.9–10.3)
Chloride: 101 mmol/L (ref 98–111)
Creatinine, Ser: 0.88 mg/dL (ref 0.44–1.00)
GFR, Estimated: >60 mL/min (ref >60)
Glucose, Bld: 98 mg/dL (ref 70–99)
Potassium: 4.5 mmol/L (ref 3.5–5.1)
Sodium: 136 mmol/L (ref 135–145)

## 2020-12-27 NOTE — Patient Instructions (Signed)
Medication Instructions:   Your physician recommends that you continue on your current medications as directed. Please refer to the Current Medication list given to you today.  *If you need a refill on your cardiac medications before your next appointment, please call your pharmacy*   Lab Work:  BMET Today  If you have labs (blood work) drawn today and your tests are completely normal, you will receive your results only by: [place] (if you have MyChart) OR A paper copy in the mail If you have any lab test that is abnormal or we need to change your treatment, we will call you to review the results.   Testing/Procedures: None today    Follow-Up: At [facility], you and your health needs are our priority.  As part of our continuing mission to provide you with exceptional heart care, we have created designated Provider Care Teams.  These Care Teams include your primary Cardiologist (physician) and Advanced Practice Providers (APPs -  Physician Assistants and Nurse Practitioners) who all work together to provide you with the care you need, when you need it.  We recommend signing up for the patient portal called "MyChart".  Sign up information is provided on this After Visit Summary.  MyChart is used to connect with patients for Virtual Visits (Telemedicine).  Patients are able to view lab/test results, encounter notes, upcoming appointments, etc.  Non-urgent messages can be sent to your provider as well.   To learn more about what you can do with MyChart, go to [email or web address].    Your next appointment:  Keep September appointment

## 2021-01-07 DIAGNOSIS — M67962 Unspecified disorder of synovium and tendon, left lower leg: Secondary | ICD-10-CM | POA: Diagnosis not present

## 2021-01-07 DIAGNOSIS — M79671 Pain in right foot: Secondary | ICD-10-CM | POA: Diagnosis not present

## 2021-01-07 DIAGNOSIS — M67961 Unspecified disorder of synovium and tendon, right lower leg: Secondary | ICD-10-CM | POA: Diagnosis not present

## 2021-01-07 DIAGNOSIS — M79672 Pain in left foot: Secondary | ICD-10-CM | POA: Diagnosis not present

## 2021-01-12 ENCOUNTER — Ambulatory Visit (INDEPENDENT_AMBULATORY_CARE_PROVIDER_SITE_OTHER): Payer: Medicare Other

## 2021-01-12 VITALS — Ht 63.0 in | Wt 165.0 lb

## 2021-01-12 DIAGNOSIS — Z0000 Encounter for general adult medical examination without abnormal findings: Secondary | ICD-10-CM | POA: Diagnosis not present

## 2021-01-12 NOTE — Patient Instructions (Signed)
[name] , Thank you for taking time to come for your Medicare Wellness Visit. I appreciate your ongoing commitment to your health goals. Please review the following plan we discussed and let me know if I can assist you in the future.   Screening recommendations/referrals: Colonoscopy: Done 02/20/2011 - repeat not required Mammogram: Done 04/26/2020 - Repeat annually  Bone Density: Done 06/19/2019 - Repeat every 2 years  Recommended yearly ophthalmology/optometry visit for glaucoma screening and checkup Recommended yearly dental visit for hygiene and checkup  Vaccinations: Influenza vaccine: Repeat every fall Pneumococcal vaccine: Done 04/02/2015 & 08/16/2016 Tdap vaccine: Done 08/11/2019 Shingles vaccine: Due. Shingrix discussed. Please contact your pharmacy for coverage information.     Covid-19: Done ~ 08/11/2019 & 09/08/2019  Advanced directives: Please bring a copy of your health care power of attorney and living will to the office to be added to your chart at your convenience.   Conditions/risks identified: Aim for 30 minutes of exercise each day, drink 6-8 glasses of water and eat lots of fruits and vegetables.   Next appointment: Follow up in one year for your annual wellness visit    Preventive Care 65 Years and Older, Female Preventive care refers to lifestyle choices and visits with your health care provider that can promote health and wellness. What does preventive care include? A yearly physical exam. This is also called an annual well check. Dental exams once or twice a year. Routine eye exams. Ask your health care provider how often you should have your eyes checked. Personal lifestyle choices, including: Daily care of your teeth and gums. Regular physical activity. Eating a healthy diet. Avoiding tobacco and drug use. Limiting alcohol use. Practicing safe sex. Taking low-dose aspirin every day. Taking vitamin and mineral supplements as recommended by your health care  provider. What happens during an annual well check? The services and screenings done by your health care provider during your annual well check will depend on your age, overall health, lifestyle risk factors, and family history of disease. Counseling  Your health care provider may ask you questions about your: Alcohol use. Tobacco use. Drug use. Emotional well-being. Home and relationship well-being. Sexual activity. Eating habits. History of falls. Memory and ability to understand (cognition). Work and work Statistician. Reproductive health. Screening  You may have the following tests or measurements: Height, weight, and BMI. Blood pressure. Lipid and cholesterol levels. These may be checked every 5 years, or more frequently if you are over 74 years old. Skin check. Lung cancer screening. You may have this screening every year starting at age 41 if you have a 30-pack-year history of smoking and currently smoke or have quit within the past 15 years. Fecal occult blood test (FOBT) of the stool. You may have this test every year starting at age 9. Flexible sigmoidoscopy or colonoscopy. You may have a sigmoidoscopy every 5 years or a colonoscopy every 10 years starting at age 74. Hepatitis C blood test. Hepatitis B blood test. Sexually transmitted disease (STD) testing. Diabetes screening. This is done by checking your blood sugar (glucose) after you have not eaten for a while (fasting). You may have this done every 1-3 years. Bone density scan. This is done to screen for osteoporosis. You may have this done starting at age 54. Mammogram. This may be done every 1-2 years. Talk to your health care provider about how often you should have regular mammograms. Talk with your health care provider about your test results, treatment options, and if necessary,  the need for more tests. Vaccines  Your health care provider may recommend certain vaccines, such as: Influenza vaccine. This is  recommended every year. Tetanus, diphtheria, and acellular pertussis (Tdap, Td) vaccine. You may need a Td booster every 10 years. Zoster vaccine. You may need this after age 64. Pneumococcal 13-valent conjugate (PCV13) vaccine. One dose is recommended after age 62. Pneumococcal polysaccharide (PPSV23) vaccine. One dose is recommended after age 44. Talk to your health care provider about which screenings and vaccines you need and how often you need them. This information is not intended to replace advice given to you by your health care provider. Make sure you discuss any questions you have with your health care provider. Document Released: 06/25/2015 Document Revised: 02/16/2016 Document Reviewed: 03/30/2015 Elsevier Interactive Patient Education  2017 [place] Prevention in the Home Falls can cause injuries. They can happen to people of all ages. There are many things you can do to make your home safe and to help prevent falls. What can I do on the outside of my home? Regularly fix the edges of walkways and driveways and fix any cracks. Remove anything that might make you trip as you walk through a door, such as a raised step or threshold. Trim any bushes or trees on the path to your home. Use bright outdoor lighting. Clear any walking paths of anything that might make someone trip, such as rocks or tools. Regularly check to see if handrails are loose or broken. Make sure that both sides of any steps have handrails. Any raised decks and porches should have guardrails on the edges. Have any leaves, snow, or ice cleared regularly. Use sand or salt on walking paths during winter. Clean up any spills in your garage right away. This includes oil or grease spills. What can I do in the bathroom? Use night lights. Install grab bars by the toilet and in the tub and shower. Do not use towel bars as grab bars. Use non-skid mats or decals in the tub or shower. If you need to sit down in  the shower, use a plastic, non-slip stool. Keep the floor dry. Clean up any water that spills on the floor as soon as it happens. Remove soap buildup in the tub or shower regularly. Attach bath mats securely with double-sided non-slip rug tape. Do not have throw rugs and other things on the floor that can make you trip. What can I do in the bedroom? Use night lights. Make sure that you have a light by your bed that is easy to reach. Do not use any sheets or blankets that are too big for your bed. They should not hang down onto the floor. Have a firm chair that has side arms. You can use this for support while you get dressed. Do not have throw rugs and other things on the floor that can make you trip. What can I do in the kitchen? Clean up any spills right away. Avoid walking on wet floors. Keep items that you use a lot in easy-to-reach places. If you need to reach something above you, use a strong step stool that has a grab bar. Keep electrical cords out of the way. Do not use floor polish or wax that makes floors slippery. If you must use wax, use non-skid floor wax. Do not have throw rugs and other things on the floor that can make you trip. What can I do with my stairs? Do not leave any items on  the stairs. Make sure that there are handrails on both sides of the stairs and use them. Fix handrails that are broken or loose. Make sure that handrails are as long as the stairways. Check any carpeting to make sure that it is firmly attached to the stairs. Fix any carpet that is loose or worn. Avoid having throw rugs at the top or bottom of the stairs. If you do have throw rugs, attach them to the floor with carpet tape. Make sure that you have a light switch at the top of the stairs and the bottom of the stairs. If you do not have them, ask someone to add them for you. What else can I do to help prevent falls? Wear shoes that: Do not have high heels. Have rubber bottoms. Are comfortable  and fit you well. Are closed at the toe. Do not wear sandals. If you use a stepladder: Make sure that it is fully opened. Do not climb a closed stepladder. Make sure that both sides of the stepladder are locked into place. Ask someone to hold it for you, if possible. Clearly mark and make sure that you can see: Any grab bars or handrails. First and last steps. Where the edge of each step is. Use tools that help you move around (mobility aids) if they are needed. These include: Canes. Walkers. Scooters. Crutches. Turn on the lights when you go into a dark area. Replace any light bulbs as soon as they burn out. Set up your furniture so you have a clear path. Avoid moving your furniture around. If any of your floors are uneven, fix them. If there are any pets around you, be aware of where they are. Review your medicines with your doctor. Some medicines can make you feel dizzy. This can increase your chance of falling. Ask your doctor what other things that you can do to help prevent falls. This information is not intended to replace advice given to you by your health care provider. Make sure you discuss any questions you have with your health care provider. Document Released: 03/25/2009 Document Revised: 11/04/2015 Document Reviewed: 07/03/2014 Elsevier Interactive Patient Education  2017 [employer].

## 2021-01-12 NOTE — Progress Notes (Signed)
Subjective:   [name] is a 87 y.o. female who presents for Medicare Annual (Subsequent) preventive examination.  Virtual Visit via Telephone Note  I connected with  [name] on 01/12/21 at  9:45 AM EDT by telephone and verified that I am speaking with the correct person using two identifiers.  Location: Patient: Home Provider: [facility] Persons participating in the virtual visit: patient/Nurse Health Advisor   I discussed the limitations, risks, security and privacy concerns of performing an evaluation and management service by telephone and the availability of in person appointments. The patient expressed understanding and agreed to proceed.  Interactive audio and video telecommunications were attempted between this nurse and patient, however failed, due to patient having technical difficulties OR patient did not have access to video capability.  We continued and completed visit with audio only.  Some vital signs may be absent or patient reported.   [name] Vest E Danise Dehne, LPN   Review of Systems     Cardiac Risk Factors include: advanced age (>43mn, >>23women);hypertension;sedentary lifestyle;Other (see comment), Risk factor comments: CAD, A.Fib     Objective:    Today's Vitals   01/12/21 0949  Weight: 165 lb (74.8 kg)  Height: '5\' 3"'$  (1.6 m)   Body mass index is 29.23 kg/m.  Advanced Directives 01/12/2021 11/17/2020 01/12/2020 03/26/2019 12/24/2018 12/03/2018 04/25/2018  Does Patient Have a Medical Advance Directive? No No No No No No No  Does patient want to make changes to medical advance directive? No - Patient declined - - - - - -  Would patient like information on creating a medical advance directive? No - Patient declined No - Patient declined Yes (MAU/Ambulatory/Procedural Areas - Information given) - No - Patient declined - No - Patient declined  Pre-existing out of facility DNR order (yellow form or pink MOST form) - - - - - - -    Current Medications  (verified) Outpatient Encounter Medications as of 01/12/2021  Medication Sig   acetaminophen (TYLENOL) 500 MG tablet Take 1,000 mg by mouth every 6 (six) hours as needed for mild pain.   amiodarone (PACERONE) 200 MG tablet Take 200 mg by mouth daily.   apixaban (ELIQUIS) 5 MG TABS tablet TAKE ONE TABLET BY MOUTH TWICE DAILY (NEEDS TO BE SEEN BEFORE NEXT REFILL)   calcium-vitamin D (OSCAL WITH D) 500-200 MG-UNIT tablet Take 1 tablet by mouth daily with breakfast.    cyanocobalamin (,VITAMIN B-12,) 1000 MCG/ML injection Inject 1 mL (1,000 mcg total) into the muscle every 30 (thirty) days.   furosemide (LASIX) 20 MG tablet Take 1 tablet (20 mg total) by mouth daily.   latanoprost (XALATAN) 0.005 % ophthalmic solution Place 1 drop into both eyes at bedtime.   melatonin 5 MG TABS Take 5 mg by mouth at bedtime as needed.   Multiple Vitamin (MULTIVITAMIN) tablet Take 1 tablet by mouth every morning.   nadolol (CORGARD) 40 MG tablet Take 1.5 tablets (60 mg total) by mouth daily. (NEEDS TO BE SEEN BEFORE NEXT REFILL)   pantoprazole (PROTONIX) 40 MG tablet Take 1 tablet by mouth daily.   Wheat Dextrin (BENEFIBER) POWD Take 1 Dose by mouth daily.   nitroGLYCERIN (NITROSTAT) 0.4 MG SL tablet DISSOLVE 1 TAB UNDER TOUNGE FOR CHEST PAIN. MAY REPEAT EVERY 5 MINUTES FOR 3 DOSES. IF NO RELIEF CALL 911 OR GO TO ER   [DISCONTINUED] amoxicillin-clavulanate (AUGMENTIN) 875-125 MG tablet Take 1 tablet by mouth 2 (two) times daily. (Patient not taking: Reported on 12/27/2020)   [  DISCONTINUED] predniSONE (STERAPRED UNI-PAK 21 TAB) 10 MG (21) TBPK tablet 6 tablets day 1, 5 tablet day 2, 4 tablet day 3, 3 tablet day 4, 2 tablet day 5, 1 tablet day 6 (Patient not taking: Reported on 12/27/2020)   Facility-Administered Encounter Medications as of 01/12/2021  Medication   bupivacaine liposome (EXPAREL) 1.3 % injection 266 mg    Allergies (verified) Iodinated diagnostic agents and Naproxen   History: Past Medical History:   Diagnosis Date   Arthritis    Atrial flutter ([place])    s/p RFA 2003   Breast cancer ([place])    1980's - lumpectomy only   Colon polyps    Diverticulosis    DVT, lower extremity ([place]) 1998 and 1999   Bilateral   Essential hypertension    Gallstones    GERD (gastroesophageal reflux disease)    Glaucoma    IBS (irritable bowel syndrome)    Mixed hyperlipidemia    Osteoporosis    Persistent atrial fibrillation ([place])    Pneumonia 2011   Sick sinus syndrome ([facility])    PPM ([facility])   Urinary tract infection    Varicose veins    Past Surgical History:  Procedure Laterality Date   BREAST LUMPECTOMY  1980's   Right breast    CARDIAC CATHETERIZATION  1999   Normal coronaries   Cataracts Bilateral    CHOLECYSTECTOMY     COLONOSCOPY W/ BIOPSIES AND POLYPECTOMY  03/2003, 05/2008, 02/20/2011   severe diverticulosis, tubulovillous adenoma polyp, internal and external hemorrhoids 2012: severe diverticulosis, 4 mm polyp, hemorrhoids   CYST REMOVAL TRUNK     under breast = on belly    DILATION AND CURETTAGE OF UTERUS     EYE SURGERY     cataracts - bilateral    Fractured wrist Left 2013   repair   INSERT / REPLACE / REMOVE PACEMAKER     KNEE ARTHROSCOPY WITH MEDIAL MENISECTOMY Right 10/21/2014   Procedure: RIGHT KNEE ARTHROSCOPY WITH MEDIAL MENISECTOM,lateral menisectomy,synovectomy suprapatellar pouch;  Surgeon: Latanya Maudlin, MD;  Location: [facility];  Service: Orthopedics;  Laterality: Right;   PACEMAKER GENERATOR CHANGE  06/18/12   [facility] DR RF, Dr Rayann Heman   PACEMAKER INSERTION  2003   PERMANENT PACEMAKER GENERATOR CHANGE N/A 06/18/2012   Procedure: PERMANENT PACEMAKER GENERATOR CHANGE;  Surgeon: Thompson Grayer, MD;  Location: [facility] CATH [facility];  Service: Cardiovascular;  Laterality: N/A;   TOTAL KNEE ARTHROPLASTY  12/20/2011   Procedure: TOTAL KNEE ARTHROPLASTY;  Surgeon: Tobi Bastos, MD;  Location: [facility];  Service: Orthopedics;  Laterality: Left;   TOTAL KNEE ARTHROPLASTY Right 02/10/2015    Procedure: TOTAL RIGHT KNEE ARTHROPLASTY;  Surgeon: Latanya Maudlin, MD;  Location: [facility];  Service: Orthopedics;  Laterality: Right;   Family History  Problem Relation Age of Onset   Throat cancer Father    Stroke Mother    Alzheimer's disease Mother    Alzheimer's disease Brother    Alzheimer's disease Sister    Colon cancer Maternal Grandfather    Stomach cancer Maternal Grandmother    Heart disease Paternal Grandmother    Heart attack Paternal Grandmother    Prostate cancer Son    Colon cancer Sister    Colon cancer Brother    Heart disease Brother    Atrial fibrillation Brother    Diabetes Brother    Dementia Brother    Parkinson's disease Brother    Prostate cancer Son    Social History   Socioeconomic History   Marital status:  Widowed    Spouse name: Not on file   Number of children: 5   Years of education: 74   Highest education level: 12th grade  Occupational History   Occupation: Retired    Comment: resturant / cafe   Tobacco Use   Smoking status: Never   Smokeless tobacco: Never  Vaping Use   Vaping Use: Never used  Substance and Sexual Activity   Alcohol use: No    Alcohol/week: 0.0 standard drinks   Drug use: No   Sexual activity: Not on file  Other Topics Concern   Not on file  Social History Narrative   Widowed  - 5 sons = 1 lives local    Retired.   Never smoker no alcohol   Social Determinants of Radio broadcast assistant Strain: Low Risk    Difficulty of Paying Living Expenses: Not hard at all  Food Insecurity: No Food Insecurity   Worried About Charity fundraiser in the Last Year: Never true   [place] in the Last Year: Never true  Transportation Needs: No Transportation Needs   Lack of Transportation (Medical): No   Lack of Transportation (Non-Medical): No  Physical Activity: Inactive   Days of Exercise per Week: 0 days   Minutes of Exercise per Session: 0 min  Stress: No Stress Concern Present   Feeling of Stress : Not at  all  Social Connections: Moderately Integrated   Frequency of Communication with Friends and Family: More than three times a week   Frequency of Social Gatherings with Friends and Family: More than three times a week   Attends Religious Services: More than 4 times per year   Active Member of [employer] or Organizations: Yes   Attends Archivist Meetings: More than 4 times per year   Marital Status: Widowed    Tobacco Counseling Counseling given: Not Answered   Clinical Intake:  Pre-visit preparation completed: Yes  Pain : No/denies pain     BMI - recorded: 29.23 Nutritional Status: BMI 25 -29 Overweight Nutritional Risks: None Diabetes: No  How often do you need to have someone help you when you read instructions, pamphlets, or other written materials from your doctor or pharmacy?: 1 - Never  Diabetic? No  Interpreter Needed?: No  Information entered by :: [name] Glanz, LPN   Activities of Daily Living In your present state of health, do you have any difficulty performing the following activities: 01/12/2021 11/17/2020  Hearing? N N  Vision? N N  Difficulty concentrating or making decisions? Y N  Walking or climbing stairs? Y N  Dressing or bathing? N N  Doing errands, shopping? N N  Comment only if they are out of town - son drives then [employer] and eating ? N -  Using the Toilet? N -  In the past six months, have you accidently leaked urine? N -  Do you have problems with loss of bowel control? N -  Managing your Medications? N -  Managing your Finances? N -  Housekeeping or managing your Housekeeping? N -  Some recent data might be hidden    Patient Care Team: Dettinger, Fransisca Kaufmann, MD as PCP - General (Family Medicine) Satira Sark, MD as PCP - Cardiology (Cardiology) Thompson Grayer, MD as PCP - Electrophysiology (Cardiology) Ilean China, RN as [place] Management [place] Comment, Allison Quarry, MD as Referring Physician  (Optometry)  Indicate any recent Medical Services you  may have received from other than [facility] providers in the past year (date may be approximate).     Assessment:   This is a routine wellness examination for [name].  Hearing/Vision screen Hearing Screening - Comments:: Denies hearing difficulties  Vision Screening - Comments:: Wears eyeglasses - up to date with annual eye exams at [facility] [place] issues and exercise activities discussed: Current Exercise Habits: Home exercise routine, Type of exercise: walking, Time (Minutes): 10, Frequency (Times/Week): 7, Weekly Exercise (Minutes/Week): 70, Intensity: Mild, Exercise limited by: orthopedic condition(s);cardiac condition(s)   Goals Addressed             This Visit's Progress    Prevent falls   On track    Stay active ( garden)  Lacinda Axon more / bake more        Depression Screen PHQ 2/9 Scores 01/12/2021 04/28/2020 01/12/2020 12/26/2019 12/24/2018 11/25/2018  PHQ - 2 Score 0 0 0 0 1 0    Fall Risk Fall Risk  01/12/2021 04/28/2020 04/28/2020 01/12/2020 12/26/2019  Falls in the past year? 0 1 0 1 0  Number falls in past yr: 0 0 - 0 -  Injury with Fall? 0 1 - 1 -  Risk for fall due to : Impaired balance/gait;Impaired vision;Orthopedic patient Impaired balance/gait - Impaired balance/gait;History of fall(s) -  Follow up Education provided;Falls prevention discussed Falls evaluation completed - Falls prevention discussed -    FALL RISK PREVENTION PERTAINING TO THE HOME:  Any stairs in or around the home? Yes  If so, are there any without handrails? No  Home free of loose throw rugs in walkways, pet beds, electrical cords, etc? Yes  Adequate lighting in your home to reduce risk of falls? Yes   ASSISTIVE DEVICES UTILIZED TO PREVENT FALLS:  Life alert? Yes  Use of a cane, walker or w/c? Yes  Grab bars in the bathroom? Yes  Shower chair or bench in shower? Yes  Elevated toilet seat or a handicapped toilet? Yes    TIMED UP AND GO:  Was the test performed? No . Telephonic visit.  Cognitive Function:     6CIT Screen 01/12/2021 01/12/2020 12/24/2018  What Year? 0 points 0 points 0 points  What month? 0 points 0 points 0 points  What time? 0 points 0 points 0 points  Count back from 20 0 points 0 points 0 points  Months in reverse 0 points 0 points 0 points  Repeat phrase 0 points 2 points 0 points  Total Score 0 2 0    Immunizations Immunization History  Administered Date(s) Administered   Fluad Quad(high Dose 65+) 03/18/2019   Influenza, High Dose Seasonal PF 04/02/2015, 04/25/2017, 03/13/2018   Influenza,trivalent, recombinat, inj, PF 03/13/2014   Pneumococcal Conjugate-13 04/02/2015   Pneumococcal Polysaccharide-23 07/22/2007, 08/16/2016   Td 08/11/2019   Tdap 01/26/2010    TDAP status: Up to date  Flu Vaccine status: Up to date  Pneumococcal vaccine status: Up to date  Covid-19 vaccine status: Completed vaccines  Qualifies for Shingles Vaccine? Yes   Zostavax completed No   Shingrix Completed?: No.    Education has been provided regarding the importance of this vaccine. Patient has been advised to call insurance company to determine out of pocket expense if they have not yet received this vaccine. Advised may also receive vaccine at local pharmacy or Health Dept. Verbalized acceptance and understanding.  Screening Tests Health Maintenance  Topic Date Due   COVID-19 Vaccine (1) Never done  Zoster Vaccines- Shingrix (1 of 2) Never done   INFLUENZA VACCINE  01/10/2021   MAMMOGRAM  04/20/2021   TETANUS/TDAP  08/10/2029   DEXA SCAN  Completed   PNA vac Low Risk Adult  Completed   HPV VACCINES  Aged Out    Health Maintenance  Health Maintenance Due  Topic Date Due   COVID-19 Vaccine (1) Never done   Zoster Vaccines- Shingrix (1 of 2) Never done   INFLUENZA VACCINE  01/10/2021    Colorectal cancer screening: No longer required.   Mammogram status: Completed  04/26/2020. Repeat every year  Bone Density status: Completed 06/19/2019. Results reflect: Bone density results: OSTEOPENIA. Repeat every 2 years.  Lung Cancer Screening: (Low Dose CT Chest recommended if Age 58-80 years, 30 pack-year currently smoking OR have quit w/in 15years.) does not qualify.   Additional Screening:  Hepatitis C Screening: does not qualify  Vision Screening: Recommended annual ophthalmology exams for early detection of glaucoma and other disorders of the eye. Is the patient up to date with their annual eye exam?  Yes  Who is the provider or what is the name of the office in which the patient attends annual eye exams? [facility] If pt is not established with a provider, would they like to be referred to a provider to establish care? No .   Dental Screening: Recommended annual dental exams for proper oral hygiene  Community Resource Referral / Chronic Care Management: CRR required this visit?  No   CCM required this visit?  No      Plan:     I have personally reviewed and noted the following in the patient's chart:   Medical and social history Use of alcohol, tobacco or illicit drugs  Current medications and supplements including opioid prescriptions.  Functional ability and status Nutritional status Physical activity Advanced directives List of other physicians Hospitalizations, surgeries, and ER visits in previous 12 months Vitals Screenings to include cognitive, depression, and falls Referrals and appointments  In addition, I have reviewed and discussed with patient certain preventive protocols, quality metrics, and best practice recommendations. A written personalized care plan for preventive services as well as general preventive health recommendations were provided to patient.     Sandrea Hammond, LPN   [id number]   Nurse Notes: None

## 2021-01-18 NOTE — Progress Notes (Signed)
Remote pacemaker transmission.   

## 2021-01-27 ENCOUNTER — Telehealth: Payer: Self-pay

## 2021-01-27 NOTE — Telephone Encounter (Signed)
Called patient and advised to increase  in monthly battery checks. Dates provided to patient with verbal understanding.

## 2021-01-27 NOTE — Telephone Encounter (Signed)
The device estimates 5.3 months until ERI. Attempted to call patient to advise increase in monthly battery check and provide dates if needed. No answer, LMTCB.   Increased battery checks to monthly in [employer] and [employer].

## 2021-02-04 ENCOUNTER — Other Ambulatory Visit: Payer: Self-pay | Admitting: Family Medicine

## 2021-02-10 ENCOUNTER — Other Ambulatory Visit: Payer: Self-pay

## 2021-02-10 ENCOUNTER — Ambulatory Visit (INDEPENDENT_AMBULATORY_CARE_PROVIDER_SITE_OTHER): Payer: Medicare Other | Admitting: Family Medicine

## 2021-02-10 ENCOUNTER — Encounter: Payer: Self-pay | Admitting: Family Medicine

## 2021-02-10 VITALS — BP 143/67 | HR 213 | Ht 63.0 in | Wt 160.0 lb

## 2021-02-10 DIAGNOSIS — K529 Noninfective gastroenteritis and colitis, unspecified: Secondary | ICD-10-CM | POA: Diagnosis not present

## 2021-02-10 MED ORDER — ONDANSETRON 4 MG PO TBDP: 4.0000 mg | ORAL_TABLET | Freq: Three times a day (TID) | ORAL | 0 refills | Status: DC | PRN

## 2021-02-10 MED ORDER — FAMOTIDINE 20 MG PO TABS: 20.0000 mg | ORAL_TABLET | Freq: Two times a day (BID) | ORAL | 2 refills | Status: DC

## 2021-02-10 NOTE — Progress Notes (Signed)
BP (!) 143/67   Pulse (!) 213   Ht '5\' 3"'$  (1.6 m)   Wt 160 lb (72.6 kg)   SpO2 94%   BMI 28.34 kg/m    Subjective:   Patient ID: [name], female    DOB: [birth year], 87 y.o.   MRN: [id number]  HPI: [name] is a 87 y.o. female presenting on 02/10/2021 for No chief complaint on file.   HPI Patient is coming in because she has not been feeling well.  She says over the past week she has developed indigestion and belching and burping and nausea and some vomiting.  She did have 1 day of diarrhea starting yesterday.  She has been just been feeling weak and not the same.  Her son did pass away a couple weeks ago and she did have COVID a few months ago.  She  Relevant past medical, surgical, family and social history reviewed and updated as indicated. Interim medical history since our last visit reviewed. Allergies and medications reviewed and updated.  Review of Systems  Constitutional:  Negative for chills and fever.  Eyes:  Negative for visual disturbance.  Respiratory:  Negative for chest tightness and shortness of breath.   Cardiovascular:  Negative for chest pain and leg swelling.  Gastrointestinal:  Positive for diarrhea, nausea and vomiting. Negative for blood in stool and constipation.  Musculoskeletal:  Negative for back pain and gait problem.  Skin:  Negative for rash.  Neurological:  Negative for light-headedness and headaches.  Psychiatric/Behavioral:  Negative for agitation and behavioral problems.   All other systems reviewed and are negative.  Per HPI unless specifically indicated above   Allergies as of 02/10/2021       Reactions   Iodinated Diagnostic Agents Hives, Itching, Rash   03/13/14: symptoms began within two hours of intrathecal injection (myelogram 03/11/14); on shoulders radiating down to stomach.  Relief with Benadryl.  Told patient she would need to pre-med with Benadryl in the future before receiving this contrast.  Brita Romp, RN    Naproxen Hives, Itching, Rash        Medication List        Accurate as of February 10, 2021  2:28 PM. If you have any questions, ask your nurse or doctor.          acetaminophen 500 MG tablet Commonly known as: TYLENOL Take 1,000 mg by mouth every 6 (six) hours as needed for mild pain.   amiodarone 200 MG tablet Commonly known as: PACERONE Take 200 mg by mouth daily.   apixaban 5 MG Tabs tablet Commonly known as: Eliquis TAKE ONE TABLET BY MOUTH TWICE DAILY (NEEDS TO BE SEEN BEFORE NEXT REFILL)   Benefiber Powd Take 1 Dose by mouth daily.   calcium-vitamin D 500-200 MG-UNIT tablet Commonly known as: OSCAL WITH D Take 1 tablet by mouth daily with breakfast.   cyanocobalamin 1000 MCG/ML injection Commonly known as: (VITAMIN B-12) Inject 1 mL (1,000 mcg total) into the muscle every 30 (thirty) days.   famotidine 20 MG tablet Commonly known as: Pepcid Take 1 tablet (20 mg total) by mouth 2 (two) times daily. Started by: Worthy Rancher, MD   furosemide 20 MG tablet Commonly known as: Lasix Take 1 tablet (20 mg total) by mouth daily.   latanoprost 0.005 % ophthalmic solution Commonly known as: XALATAN Place 1 drop into both eyes at bedtime.   melatonin 5 MG Tabs Take 5 mg by mouth at bedtime as needed.  multivitamin tablet Take 1 tablet by mouth every morning.   nadolol 40 MG tablet Commonly known as: CORGARD Take 1.5 tablets (60 mg total) by mouth daily. (NEEDS TO BE SEEN BEFORE NEXT REFILL)   nitroGLYCERIN 0.4 MG SL tablet Commonly known as: NITROSTAT DISSOLVE 1 TAB UNDER TOUNGE FOR CHEST PAIN. MAY REPEAT EVERY 5 MINUTES FOR 3 DOSES. IF NO RELIEF CALL 911 OR GO TO ER   ondansetron 4 MG disintegrating tablet Commonly known as: Zofran ODT Take 1 tablet (4 mg total) by mouth every 8 (eight) hours as needed for nausea or vomiting. Started by: Fransisca Kaufmann Eleen Litz, MD   pantoprazole 40 MG tablet Commonly known as: PROTONIX Take 1 tablet by mouth  daily.         Objective:   BP (!) 143/67   Pulse (!) 213   Ht '5\' 3"'$  (1.6 m)   Wt 160 lb (72.6 kg)   SpO2 94%   BMI 28.34 kg/m   Wt Readings from Last 3 Encounters:  02/10/21 160 lb (72.6 kg)  01/12/21 165 lb (74.8 kg)  12/27/20 165 lb (74.8 kg)    Physical Exam Vitals and nursing note reviewed.  Constitutional:      General: She is not in acute distress.    Appearance: She is well-developed. She is not diaphoretic.  Eyes:     Conjunctiva/sclera: Conjunctivae normal.  Cardiovascular:     Rate and Rhythm: Normal rate and regular rhythm.     Heart sounds: Normal heart sounds. No murmur heard. Pulmonary:     Effort: Pulmonary effort is normal. No respiratory distress.     Breath sounds: Normal breath sounds. No wheezing.  Abdominal:     General: Abdomen is flat. Bowel sounds are normal. There is no distension.     Tenderness: There is abdominal tenderness in the epigastric area. There is no guarding or rebound.  Musculoskeletal:        General: No tenderness. Normal range of motion.  Skin:    General: Skin is warm and dry.     Findings: No rash.  Neurological:     Mental Status: She is alert and oriented to person, place, and time.     Coordination: Coordination normal.  Psychiatric:        Behavior: Behavior normal.      Assessment & Plan:   Problem List Items Addressed This Visit   None Visit Diagnoses     Gastroenteritis    -  Primary   Relevant Medications   ondansetron (ZOFRAN ODT) 4 MG disintegrating tablet   famotidine (PEPCID) 20 MG tablet   Other Relevant Orders   Novel Coronavirus, NAA ([facility])       Will try Pepcid and Zofran to calm her stomach down, will do COVID swab but think it is more related to a gastroenteritis. Follow up plan: Return if symptoms worsen or fail to improve.  Counseling provided for all of the vaccine components Orders Placed This Encounter  Procedures   Novel Coronavirus, NAA ([facility])    Caryl Pina,  MD [facility] 02/10/2021, 2:28 PM

## 2021-02-11 LAB — SARS-COV-2, NAA 2 DAY TAT

## 2021-02-11 LAB — NOVEL CORONAVIRUS, NAA: SARS-CoV-2, NAA: NOT DETECTED

## 2021-02-14 ENCOUNTER — Other Ambulatory Visit: Payer: Self-pay | Admitting: Family Medicine

## 2021-02-15 ENCOUNTER — Other Ambulatory Visit: Payer: Self-pay

## 2021-02-15 NOTE — Telephone Encounter (Signed)
Last office visit 02/10/21 Last refill 12/08/20, #45

## 2021-02-16 ENCOUNTER — Other Ambulatory Visit: Payer: Self-pay

## 2021-02-16 ENCOUNTER — Emergency Department (HOSPITAL_COMMUNITY)
Admission: EM | Admit: 2021-02-16 | Discharge: 2021-02-16 | Disposition: A | Discharge status: Home / Self Care | Payer: Medicare Other | Attending: Emergency Medicine | Admitting: Emergency Medicine

## 2021-02-16 ENCOUNTER — Encounter (HOSPITAL_COMMUNITY): Payer: Self-pay

## 2021-02-16 DIAGNOSIS — Y9389 Activity, other specified: Secondary | ICD-10-CM | POA: Diagnosis not present

## 2021-02-16 DIAGNOSIS — R0902 Hypoxemia: Secondary | ICD-10-CM | POA: Diagnosis not present

## 2021-02-16 DIAGNOSIS — M25519 Pain in unspecified shoulder: Secondary | ICD-10-CM | POA: Diagnosis not present

## 2021-02-16 DIAGNOSIS — Y92096 Garden or yard of other non-institutional residence as the place of occurrence of the external cause: Secondary | ICD-10-CM | POA: Insufficient documentation

## 2021-02-16 DIAGNOSIS — W19XXXA Unspecified fall, initial encounter: Secondary | ICD-10-CM | POA: Diagnosis not present

## 2021-02-16 DIAGNOSIS — Z5321 Procedure and treatment not carried out due to patient leaving prior to being seen by health care provider: Secondary | ICD-10-CM | POA: Insufficient documentation

## 2021-02-16 DIAGNOSIS — M542 Cervicalgia: Secondary | ICD-10-CM | POA: Diagnosis not present

## 2021-02-16 DIAGNOSIS — M545 Low back pain, unspecified: Secondary | ICD-10-CM | POA: Insufficient documentation

## 2021-02-16 DIAGNOSIS — Z743 Need for continuous supervision: Secondary | ICD-10-CM | POA: Diagnosis not present

## 2021-02-16 DIAGNOSIS — W01198A Fall on same level from slipping, tripping and stumbling with subsequent striking against other object, initial encounter: Secondary | ICD-10-CM | POA: Insufficient documentation

## 2021-02-16 NOTE — ED Triage Notes (Signed)
Pt to er via ems, per ems pt is here because she was working in the yard and fell over and hurt her bottom.  Pt states that she tripped/ twisted her feet and fell backwards onto her bottom, pt c/o lower back pain. Pt denies loss of bowel or bladder function.  Pt also c/o shoulder and neck pain, pt states that she hit her head, denies loc.

## 2021-02-18 ENCOUNTER — Ambulatory Visit: Payer: Medicare Other | Admitting: Cardiology

## 2021-02-21 DIAGNOSIS — M546 Pain in thoracic spine: Secondary | ICD-10-CM | POA: Diagnosis not present

## 2021-02-21 DIAGNOSIS — R0781 Pleurodynia: Secondary | ICD-10-CM | POA: Diagnosis not present

## 2021-02-21 DIAGNOSIS — M545 Low back pain, unspecified: Secondary | ICD-10-CM | POA: Diagnosis not present

## 2021-02-22 ENCOUNTER — Ambulatory Visit: Payer: Medicare Other | Admitting: Family Medicine

## 2021-02-28 ENCOUNTER — Ambulatory Visit (INDEPENDENT_AMBULATORY_CARE_PROVIDER_SITE_OTHER): Payer: Medicare Other

## 2021-02-28 DIAGNOSIS — I495 Sick sinus syndrome: Secondary | ICD-10-CM

## 2021-03-01 LAB — CUP PACEART REMOTE DEVICE CHECK
Battery Remaining Longevity: 4 mo
Battery Remaining Percentage: 4 %
Battery Voltage: 2.72 V
Brady Statistic AP VP Percent: 99 %
Brady Statistic AP VS Percent: 1 %
Brady Statistic AS VP Percent: 1 %
Brady Statistic AS VS Percent: 1 %
Brady Statistic RA Percent Paced: 99 %
Brady Statistic RV Percent Paced: 99 %
Date Time Interrogation Session: 20220920003652
Implantable Lead Implant Date: 20031028
Implantable Lead Implant Date: 20031028
Implantable Lead Location: 753859
Implantable Lead Location: 753860
Implantable Pulse Generator Implant Date: 20140107
Lead Channel Impedance Value: 350 Ohm
Lead Channel Impedance Value: 450 Ohm
Lead Channel Pacing Threshold Amplitude: 0.5 V
Lead Channel Pacing Threshold Amplitude: 0.75 V
Lead Channel Pacing Threshold Pulse Width: 0.5 ms
Lead Channel Pacing Threshold Pulse Width: 0.5 ms
Lead Channel Sensing Intrinsic Amplitude: 0.2 mV
Lead Channel Sensing Intrinsic Amplitude: 10.9 mV
Lead Channel Setting Pacing Amplitude: 2 V
Lead Channel Setting Pacing Amplitude: 2.5 V
Lead Channel Setting Pacing Pulse Width: 0.5 ms
Lead Channel Setting Sensing Sensitivity: 2 mV
Pulse Gen Model: 2210
Pulse Gen Serial Number: 7438567

## 2021-03-04 NOTE — Progress Notes (Signed)
Remote pacemaker transmission.   

## 2021-03-04 NOTE — Addendum Note (Signed)
Addended by: Cheri Kearns A on: 03/04/2021 08:36 AM   Modules accepted: Level of Service

## 2021-03-08 ENCOUNTER — Ambulatory Visit (INDEPENDENT_AMBULATORY_CARE_PROVIDER_SITE_OTHER): Payer: Medicare Other

## 2021-03-08 ENCOUNTER — Other Ambulatory Visit: Payer: Self-pay | Admitting: Family Medicine

## 2021-03-08 ENCOUNTER — Ambulatory Visit (INDEPENDENT_AMBULATORY_CARE_PROVIDER_SITE_OTHER): Payer: Medicare Other | Admitting: Family Medicine

## 2021-03-08 ENCOUNTER — Encounter: Payer: Self-pay | Admitting: Family Medicine

## 2021-03-08 ENCOUNTER — Other Ambulatory Visit: Payer: Self-pay

## 2021-03-08 VITALS — BP 137/81 | HR 65 | Temp 97.8°F | Ht 63.0 in | Wt 164.0 lb

## 2021-03-08 DIAGNOSIS — R0609 Other forms of dyspnea: Secondary | ICD-10-CM

## 2021-03-08 DIAGNOSIS — K529 Noninfective gastroenteritis and colitis, unspecified: Secondary | ICD-10-CM

## 2021-03-08 DIAGNOSIS — R600 Localized edema: Secondary | ICD-10-CM

## 2021-03-08 DIAGNOSIS — R0600 Dyspnea, unspecified: Secondary | ICD-10-CM

## 2021-03-08 DIAGNOSIS — I517 Cardiomegaly: Secondary | ICD-10-CM | POA: Diagnosis not present

## 2021-03-08 DIAGNOSIS — Z23 Encounter for immunization: Secondary | ICD-10-CM | POA: Diagnosis not present

## 2021-03-08 MED ORDER — FUROSEMIDE 20 MG PO TABS
20.0000 mg | ORAL_TABLET | Freq: Every day | ORAL | 3 refills | Status: DC
Start: 2021-03-08 — End: 2021-03-18

## 2021-03-08 NOTE — Progress Notes (Signed)
Subjective:  Patient ID: [name], female    DOB: 12/18/1928, 87 y.o.   MRN: [id number]  Patient Care Team: Dettinger, Fransisca Kaufmann, MD as PCP - General (Family Medicine) Satira Sark, MD as PCP - Cardiology (Cardiology) Thompson Grayer, MD as PCP - Electrophysiology (Cardiology) Ilean China, RN as [place] Management [place] Comment, Allison Quarry, MD as Referring Physician (Optometry) Steffanie Rainwater, DPM as Consulting Physician (Podiatry) Gatha Mayer, MD as Consulting Physician (Gastroenterology)   Chief Complaint:  Leg Swelling   HPI: [name] is a 87 y.o. female presenting on 03/08/2021 for Leg Swelling   Pt presents today with complaints of bilateral lower extremity swelling, weeping in right foot, and shortness of breath with activity. States this started 3 weeks ago and does not seem to be improving. She was prescribed lasix in the past but has not been on in several weeks. She denies chest pain, orthopnea, PND, palpitations, dizziness, or syncope. Last Echo 11/2020 revealed EF 55-60%, moderate LVH, LV grade II diastolic dysfunction, severely elevated pulmonary artery systolic pressure, tricuspid regurgitation, left and right atrium mildly dilated, mild MVR, mild to moderate aortic valve sclerosis without stenosis. She does not follow up with cardiology until November.    Relevant past medical, surgical, family, and social history reviewed and updated as indicated.  Allergies and medications reviewed and updated. Data reviewed: Chart in Epic.   Past Medical History:  Diagnosis Date   Arthritis    Atrial flutter ([place])    s/p RFA 2003   Breast cancer ([place])    1980's - lumpectomy only   Colon polyps    Diverticulosis    DVT, lower extremity ([place]) 1998 and 1999   Bilateral   Essential hypertension    Gallstones    GERD (gastroesophageal reflux disease)    Glaucoma    IBS (irritable bowel syndrome)    Mixed hyperlipidemia     Osteoporosis    Persistent atrial fibrillation ([place])    Pneumonia 2011   Sick sinus syndrome ([facility])    PPM ([facility])   Urinary tract infection    Varicose veins     Past Surgical History:  Procedure Laterality Date   BREAST LUMPECTOMY  1980's   Right breast    CARDIAC CATHETERIZATION  1999   Normal coronaries   Cataracts Bilateral    CHOLECYSTECTOMY     COLONOSCOPY W/ BIOPSIES AND POLYPECTOMY  03/2003, 05/2008, 02/20/2011   severe diverticulosis, tubulovillous adenoma polyp, internal and external hemorrhoids 2012: severe diverticulosis, 4 mm polyp, hemorrhoids   CYST REMOVAL TRUNK     under breast = on belly    DILATION AND CURETTAGE OF UTERUS     EYE SURGERY     cataracts - bilateral    Fractured wrist Left 2013   repair   INSERT / REPLACE / REMOVE PACEMAKER     KNEE ARTHROSCOPY WITH MEDIAL MENISECTOMY Right 10/21/2014   Procedure: RIGHT KNEE ARTHROSCOPY WITH MEDIAL MENISECTOM,lateral menisectomy,synovectomy suprapatellar pouch;  Surgeon: Latanya Maudlin, MD;  Location: [facility];  Service: Orthopedics;  Laterality: Right;   PACEMAKER GENERATOR CHANGE  06/18/12   [facility] Accent DR RF, Dr Rayann Heman   PACEMAKER INSERTION  2003   PERMANENT PACEMAKER GENERATOR CHANGE N/A 06/18/2012   Procedure: PERMANENT PACEMAKER GENERATOR CHANGE;  Surgeon: Thompson Grayer, MD;  Location: [facility];  Service: Cardiovascular;  Laterality: N/A;   TOTAL KNEE ARTHROPLASTY  12/20/2011   Procedure: TOTAL KNEE ARTHROPLASTY;  Surgeon: Jori Moll  Fransico Setters, MD;  Location: [facility];  Service: Orthopedics;  Laterality: Left;   TOTAL KNEE ARTHROPLASTY Right 02/10/2015   Procedure: TOTAL RIGHT KNEE ARTHROPLASTY;  Surgeon: Latanya Maudlin, MD;  Location: [facility];  Service: Orthopedics;  Laterality: Right;    Social History   Socioeconomic History   Marital status: Widowed    Spouse name: Not on file   Number of children: 5   Years of education: 25   Highest education level: 12th grade  Occupational History   Occupation: Retired     Comment: resturant / cafe   Tobacco Use   Smoking status: Never   Smokeless tobacco: Never  Vaping Use   Vaping Use: Never used  Substance and Sexual Activity   Alcohol use: No    Alcohol/week: 0.0 standard drinks   Drug use: No   Sexual activity: Not on file  Other Topics Concern   Not on file  Social History Narrative   Widowed  - 5 sons = 1 lives local    Retired.   Never smoker no alcohol   Social Determinants of Radio broadcast assistant Strain: Low Risk    Difficulty of Paying Living Expenses: Not hard at all  Food Insecurity: No Food Insecurity   Worried About Charity fundraiser in the Last Year: Never true   [place] in the Last Year: Never true  Transportation Needs: No Transportation Needs   Lack of Transportation (Medical): No   Lack of Transportation (Non-Medical): No  Physical Activity: Inactive   Days of Exercise per Week: 0 days   Minutes of Exercise per Session: 0 min  Stress: No Stress Concern Present   Feeling of Stress : Not at all  Social Connections: Moderately Integrated   Frequency of Communication with Friends and Family: More than three times a week   Frequency of Social Gatherings with Friends and Family: More than three times a week   Attends Religious Services: More than 4 times per year   Active Member of [employer] or Organizations: Yes   Attends Archivist Meetings: More than 4 times per year   Marital Status: Widowed  Intimate Partner Violence: Not At Risk   Fear of Current or Ex-Partner: No   Emotionally Abused: No   Physically Abused: No   Sexually Abused: No    Outpatient Encounter Medications as of 03/08/2021  Medication Sig   acetaminophen (TYLENOL) 500 MG tablet Take 1,000 mg by mouth every 6 (six) hours as needed for mild pain.   amiodarone (PACERONE) 200 MG tablet Take 200 mg by mouth daily.   calcium-vitamin D (OSCAL WITH D) 500-200 MG-UNIT tablet Take 1 tablet by mouth daily with breakfast.    cyanocobalamin  (,VITAMIN B-12,) 1000 MCG/ML injection Inject 1 mL (1,000 mcg total) into the muscle every 30 (thirty) days.   famotidine (PEPCID) 20 MG tablet Take 1 tablet (20 mg total) by mouth 2 (two) times daily.   furosemide (LASIX) 20 MG tablet Take 1 tablet (20 mg total) by mouth daily. Take 2 tablets for the first 3 days, then 1 tablet daily   latanoprost (XALATAN) 0.005 % ophthalmic solution Place 1 drop into both eyes at bedtime.   melatonin 5 MG TABS Take 5 mg by mouth at bedtime as needed.   Multiple Vitamin (MULTIVITAMIN) tablet Take 1 tablet by mouth every morning.   nadolol (CORGARD) 40 MG tablet TAKE 1 AND 1/2 TABLETS DAILY   nitroGLYCERIN (NITROSTAT) 0.4 MG SL  tablet DISSOLVE 1 TAB UNDER TOUNGE FOR CHEST PAIN. MAY REPEAT EVERY 5 MINUTES FOR 3 DOSES. IF NO RELIEF CALL 911 OR GO TO ER   traMADol (ULTRAM) 50 MG tablet Take 50 mg by mouth every 6 (six) hours as needed.   Wheat Dextrin (BENEFIBER) POWD Take 1 Dose by mouth daily.   [DISCONTINUED] apixaban (ELIQUIS) 5 MG TABS tablet TAKE ONE TABLET BY MOUTH TWICE DAILY (NEEDS TO BE SEEN BEFORE NEXT REFILL)   [DISCONTINUED] ondansetron (ZOFRAN ODT) 4 MG disintegrating tablet Take 1 tablet (4 mg total) by mouth every 8 (eight) hours as needed for nausea or vomiting.   [DISCONTINUED] pantoprazole (PROTONIX) 40 MG tablet Take 1 tablet by mouth daily.   [DISCONTINUED] furosemide (LASIX) 20 MG tablet Take 1 tablet (20 mg total) by mouth daily.   Facility-Administered Encounter Medications as of 03/08/2021  Medication   bupivacaine liposome (EXPAREL) 1.3 % injection 266 mg    Allergies  Allergen Reactions   Iodinated Diagnostic Agents Hives, Itching and Rash    03/13/14: symptoms began within two hours of intrathecal injection (myelogram 03/11/14); on shoulders radiating down to stomach.  Relief with Benadryl.  Told patient she would need to pre-med with Benadryl in the future before receiving this contrast.  Brita Romp, RN   Naproxen Hives, Itching and  Rash    Review of Systems  Constitutional:  Negative for activity change, appetite change, chills, diaphoresis, fatigue, fever and unexpected weight change.  HENT: Negative.  Negative for congestion.   Eyes: Negative.   Respiratory:  Positive for cough and shortness of breath. Negative for apnea, choking, chest tightness and wheezing.   Cardiovascular:  Positive for leg swelling. Negative for chest pain and palpitations.  Gastrointestinal:  Negative for abdominal distention, abdominal pain, blood in stool, constipation, diarrhea, nausea and vomiting.  Endocrine: Negative.   Genitourinary:  Negative for decreased urine volume, difficulty urinating, dysuria, frequency and urgency.  Musculoskeletal:  Negative for arthralgias and myalgias.  Skin: Negative.   Allergic/Immunologic: Negative.   Neurological:  Positive for weakness (generalized). Negative for dizziness, syncope, light-headedness and headaches.  Hematological: Negative.   Psychiatric/Behavioral:  Negative for confusion, hallucinations, sleep disturbance and suicidal ideas.   All other systems reviewed and are negative.      Objective:  BP 137/81   Pulse 65   Temp 97.8 F (36.6 C)   Ht $R'5\' 3"'FR$  (1.6 m)   Wt 164 lb (74.4 kg)   SpO2 92%   BMI 29.05 kg/m    Wt Readings from Last 3 Encounters:  03/08/21 164 lb (74.4 kg)  02/10/21 160 lb (72.6 kg)  01/12/21 165 lb (74.8 kg)    Physical Exam Vitals and nursing note reviewed.  Constitutional:      General: She is not in acute distress.    Appearance: Normal appearance. She is well-developed, well-groomed and overweight. She is not ill-appearing, toxic-appearing or diaphoretic.  HENT:     Head: Normocephalic and atraumatic.     Jaw: There is normal jaw occlusion.     Right Ear: Hearing normal.     Left Ear: Hearing normal.     Nose: Nose normal.     Mouth/Throat:     Lips: Pink.     Mouth: Mucous membranes are moist.     Pharynx: Oropharynx is clear. Uvula midline.   Eyes:     General: Lids are normal.     Extraocular Movements: Extraocular movements intact.     Conjunctiva/sclera: Conjunctivae normal.  Pupils: Pupils are equal, round, and reactive to light.  Neck:     Thyroid: No thyroid mass, thyromegaly or thyroid tenderness.     Vascular: No carotid bruit or JVD.     Trachea: Trachea and phonation normal.  Cardiovascular:     Rate and Rhythm: Normal rate and regular rhythm.     Chest Wall: PMI is not displaced.     Pulses: Normal pulses.          Dorsalis pedis pulses are 2+ on the right side and 2+ on the left side.       Posterior tibial pulses are 2+ on the right side and 2+ on the left side.     Heart sounds: Murmur heard.  Systolic murmur is present with a grade of 2/6.    No friction rub. Gallop present. S3 sounds present.     Comments: Chronic discoloration of BLE. Visible varicose veins to left medial lower leg. Pulmonary:     Effort: Pulmonary effort is normal. No respiratory distress.     Breath sounds: No stridor. Examination of the right-lower field reveals rales. Examination of the left-lower field reveals rales. Rales (mild) present. No wheezing or rhonchi.  Chest:     Chest wall: No tenderness.    Abdominal:     General: Bowel sounds are normal. There is no distension or abdominal bruit.     Palpations: Abdomen is soft. There is no hepatomegaly or splenomegaly.     Tenderness: There is no abdominal tenderness. There is no right CVA tenderness or left CVA tenderness.     Hernia: No hernia is present.  Musculoskeletal:        General: Normal range of motion.     Cervical back: Normal range of motion and neck supple.     Right lower leg: 1+ Pitting Edema present.     Left lower leg: 1+ Pitting Edema present.  Lymphadenopathy:     Cervical: No cervical adenopathy.  Skin:    General: Skin is warm and dry.     Capillary Refill: Capillary refill takes less than 2 seconds.     Coloration: Skin is not cyanotic, jaundiced or  pale.     Findings: No rash.  Neurological:     General: No focal deficit present.     Mental Status: She is alert and oriented to person, place, and time.     Cranial Nerves: Cranial nerves are intact.     Sensory: Sensation is intact.     Motor: Motor function is intact.     Coordination: Coordination is intact.     Gait: Gait abnormal (using walker).     Deep Tendon Reflexes: Reflexes are normal and symmetric.  Psychiatric:        Attention and Perception: Attention and perception normal.        Mood and Affect: Mood and affect normal.        Speech: Speech normal.        Behavior: Behavior normal. Behavior is cooperative.        Thought Content: Thought content normal.        Cognition and Memory: Cognition and memory normal.        Judgment: Judgment normal.    Results for orders placed or performed in visit on 02/28/21  CUP PACEART REMOTE DEVICE CHECK  Result Value Ref Range   Date Time Interrogation Session [id number]    Pulse Generator Manufacturer [employer]    Pulse Gen Model 2210 Accent  DR RF    Pulse Gen Serial Number [id number]    Clinic Name [place] Pulse Generator Type Implantable Pulse Generator    Implantable Pulse Generator Implant Date 13143888    Implantable Lead Manufacturer [employer]    Implantable Lead Model 1488TC Tendril SDX    Implantable Lead Serial Number [id number]    Implantable Lead Implant Date 20601561    Implantable Lead Location Detail 1 APPENDAGE    Implantable Lead Location [id number]    Implantable Lead Manufacturer [employer]    Implantable Lead Model 1488TC Tendril SDX    Implantable Lead Serial Number [id number]    Implantable Lead Implant Date [id number]    Implantable Lead Location Detail 1 APEX    Implantable Lead Location [phone]    Lead Channel Setting Sensing Sensitivity 2.0 mV   Lead Channel Setting Sensing Adaptation Mode Fixed Pacing    Lead Channel Setting Pacing Amplitude 2.0 V   Lead Channel Setting Pacing Pulse Width 0.5 ms    Lead Channel Setting Pacing Amplitude 2.5 V   Lead Channel Status NULL    Lead Channel Impedance Value 350 ohm   Lead Channel Sensing Intrinsic Amplitude 0.2 mV   Lead Channel Pacing Threshold Amplitude 0.5 V   Lead Channel Pacing Threshold Pulse Width 0.5 ms   Lead Channel Status NULL    Lead Channel Impedance Value 450 ohm   Lead Channel Sensing Intrinsic Amplitude 10.9 mV   Lead Channel Pacing Threshold Amplitude 0.75 V   Lead Channel Pacing Threshold Pulse Width 0.5 ms   Battery Status MOS    Battery Remaining Longevity 4 mo   Battery Remaining Percentage 4.0 %   Battery Voltage 2.72 V   Brady Statistic RA Percent Paced 99.0 %   Brady Statistic RV Percent Paced 99.0 %   Brady Statistic AP VP Percent 99.0 %   Brady Statistic AS VP Percent 1.0 %   Brady Statistic AP VS Percent 1.0 %   Brady Statistic AS VS Percent 1.0 %     X-Ray: CXR: Cardiomegaly, right pacemaker in place with lead wires in place, mild pulmonary vascular congestion. Preliminary x-ray reading by Monia Pouch, FNP-C, [facility].   Pertinent labs & imaging results that were available during my care of the patient were reviewed by me and considered in my medical decision making.  Assessment & Plan:  [name] was seen today for leg swelling.  Diagnoses and all orders for this visit:  Bilateral lower extremity edema Dyspnea on exertion Pulmonary vascular congestion noted on CXR in office. BLE 1+ pitting edema. Dyspnea on exertion. BNP and BMP pending. Lasix 40 mg for three days then 20 mg daily until follow up with cardiology. Pt aware to call tomorrow to make sooner follow up for evaluation and possible repeat echo. Pt aware to report any new, worsening, or persistent symptoms. Follow up in 1-2 weeks for reevaluation, sooner if needed.  -     BMP8+EGFR -     Brain natriuretic peptide -     DG Chest 2 View; Future -     furosemide (LASIX) 20 MG tablet; Take 1 tablet (20 mg total) by mouth daily. Take 2 tablets for  the first 3 days, then 1 tablet daily -     Ambulatory referral to Cardiology  Need for immunization against influenza -     Flu Vaccine QUAD High Dose(Fluad)    Continue all other maintenance medications.  Follow up plan: Return in about 1 week (  around 03/15/2021), or if symptoms worsen or fail to improve.   Continue healthy lifestyle choices, including diet (rich in fruits, vegetables, and lean proteins, and low in salt and simple carbohydrates) and exercise (at least 30 minutes of moderate physical activity daily).  Educational handout given for edema  The above assessment and management plan was discussed with the patient. The patient verbalized understanding of and has agreed to the management plan. Patient is aware to call the clinic if they develop any new symptoms or if symptoms persist or worsen. Patient is aware when to return to the clinic for a follow-up visit. Patient educated on when it is appropriate to go to the emergency department.   Monia Pouch, FNP-C [facility] [phone]

## 2021-03-09 ENCOUNTER — Telehealth: Payer: Self-pay | Admitting: Family Medicine

## 2021-03-09 LAB — BMP8+EGFR
BUN/Creatinine Ratio: 15 (ref 12–28)
BUN: 13 mg/dL (ref 8–27)
CO2: 25 mmol/L (ref 20–29)
Calcium: 9.5 mg/dL (ref 8.7–10.3)
Chloride: 100 mmol/L (ref 96–106)
Creatinine, Ser: 0.85 mg/dL (ref 0.57–1.00)
Glucose: 102 mg/dL — ABNORMAL HIGH (ref 70–99)
Potassium: 5.1 mmol/L (ref 3.5–5.2)
Sodium: 139 mmol/L (ref 134–144)
eGFR: 65 mL/min/{1.73_m2} (ref >59)

## 2021-03-09 LAB — BRAIN NATRIURETIC PEPTIDE: BNP: 376.8 pg/mL — ABNORMAL HIGH (ref 0.0–100.0)

## 2021-03-09 NOTE — Telephone Encounter (Signed)
Patient notifed of results

## 2021-03-17 NOTE — Progress Notes (Signed)
Cardiology Office Note  Date: 03/18/2021   ID: [name], DOB [birth year], MRN [id number]  PCP:  Dettinger, Fransisca Kaufmann, MD  Cardiologist:  Rozann Lesches, MD Electrophysiologist:  Thompson Grayer, MD   Chief Complaint: lower extremity swelling  History of Present Illness: [name] is a 87 y.o. female with a history of hypertension, PAF, sick sinus syndrome, pulmonary hypertension, DVT, CAD, HLD.  She had a recent hospital admission on 11/17/2020 with several day history of pressure-like chest pain, nonradiating and nonexertional, associated with dyspnea and palpitations fall generalized weakness.  Chest pain increased when lying down on the left side.  She denied any PND or lower extremity edema.  She was ruled out for ACS.  Echo with preserved EF but pulmonary hypertension.  She was discharged home on Lasix 20 mg daily and outpatient stress testing was ordered.  She was status post ablation for atrial flutter 2003.  Her recent device interrogation showed less than 1% AF burden.  She was continuing nadolol and amiodarone along with Eliquis 5 mg p.o. twice daily. Stress test was low risk.   She recently presented 03/08/2021 with lower extremity swelling and was seen by Darla Lesches FNP at [facility] family medicine on 03/08/2021.   She had weeping in right foot and shortness of breath with activity.  She stated this started 3 weeks prior and did not seem to be improving.  She had been prescribed Lasix in the past but had not been on Lasix in several weeks.  Her prior echo on 11/29/2020 revealed EF 50 to 55%.  Moderate LVH, grade 2 DD, severely elevated pulmonary artery systolic pressure, tricuspid regurgitation, left and right atrium mildly dilated, mild MR, mild to moderate aortic valve sclerosis without stenosis.  She was ordered Lasix for 3 days 40 mg then to revert back to 20 mg until follow-up with cardiology.  Lab work was reordered with basic metabolic panel, BNP, and  chest x-ray.  She was referred to cardiology.  She is here after being referred from [facility] for lower extremity edema.  She states her edema has basically resolved.  She has some minor lower extremity edema.  She does have some significant venous varicosities which may be contributing to lower extremity edema.  Her weight is stable around 161 today.  She denies any significant DOE or SOB.  She is not very active on a daily basis.  Denies any PND or orthopnea.  She had a low risk stress test ordered by Dr. Domenic Polite earlier in the year.  She denies any anginal symptoms or nitroglycerin use.  Blood pressure is elevated today 140/80.  She states her systolic blood pressures at home runs between 130-140.  She is taking all of her medications as directed and compliant.  Heart rate is well controlled today at 60 bpm.  Denies any bleeding on Eliquis.   Past Medical History:  Diagnosis Date   Arthritis    Atrial flutter [facility])    s/p RFA 2003   Breast cancer ([place])    1980's - lumpectomy only   Colon polyps    Diverticulosis    DVT, lower extremity ([place]) 1998 and 1999   Bilateral   Essential hypertension    Gallstones    GERD (gastroesophageal reflux disease)    Glaucoma    IBS (irritable bowel syndrome)    Mixed hyperlipidemia    Osteoporosis    Persistent atrial fibrillation ([place])    Pneumonia 2011   Sick sinus  syndrome ([place])    PPM ([facility])   Urinary tract infection    Varicose veins     Past Surgical History:  Procedure Laterality Date   BREAST LUMPECTOMY  1980's   Right breast    CARDIAC CATHETERIZATION  1999   Normal coronaries   Cataracts Bilateral    CHOLECYSTECTOMY     COLONOSCOPY W/ BIOPSIES AND POLYPECTOMY  03/2003, 05/2008, 02/20/2011   severe diverticulosis, tubulovillous adenoma polyp, internal and external hemorrhoids 2012: severe diverticulosis, 4 mm polyp, hemorrhoids   CYST REMOVAL TRUNK     under breast = on belly    DILATION AND CURETTAGE OF UTERUS     EYE  SURGERY     cataracts - bilateral    Fractured wrist Left 2013   repair   INSERT / REPLACE / REMOVE PACEMAKER     KNEE ARTHROSCOPY WITH MEDIAL MENISECTOMY Right 10/21/2014   Procedure: RIGHT KNEE ARTHROSCOPY WITH MEDIAL MENISECTOM,lateral menisectomy,synovectomy suprapatellar pouch;  Surgeon: Latanya Maudlin, MD;  Location: [facility];  Service: Orthopedics;  Laterality: Right;   PACEMAKER GENERATOR CHANGE  06/18/12   [facility] DR RF, Dr Rayann Heman   PACEMAKER INSERTION  2003   PERMANENT PACEMAKER GENERATOR CHANGE N/A 06/18/2012   Procedure: PERMANENT PACEMAKER GENERATOR CHANGE;  Surgeon: Thompson Grayer, MD;  Location: [facility] CATH LAB;  Service: Cardiovascular;  Laterality: N/A;   TOTAL KNEE ARTHROPLASTY  12/20/2011   Procedure: TOTAL KNEE ARTHROPLASTY;  Surgeon: Tobi Bastos, MD;  Location: [facility];  Service: Orthopedics;  Laterality: Left;   TOTAL KNEE ARTHROPLASTY Right 02/10/2015   Procedure: TOTAL RIGHT KNEE ARTHROPLASTY;  Surgeon: Latanya Maudlin, MD;  Location: [facility];  Service: Orthopedics;  Laterality: Right;    Current Outpatient Medications  Medication Sig Dispense Refill   acetaminophen (TYLENOL) 500 MG tablet Take 1,000 mg by mouth every 6 (six) hours as needed for mild pain.     amiodarone (PACERONE) 200 MG tablet TAKE ONE (1) TABLET EACH DAY 90 tablet 0   calcium-vitamin D (OSCAL WITH D) 500-200 MG-UNIT tablet Take 1 tablet by mouth daily with breakfast.      cyanocobalamin (,VITAMIN B-12,) 1000 MCG/ML injection Inject 1 mL (1,000 mcg total) into the muscle every 30 (thirty) days. 3 mL 1   ELIQUIS 5 MG TABS tablet TAKE ONE TABLET BY MOUTH TWICE DAILY 60 tablet 0   famotidine (PEPCID) 20 MG tablet Take 1 tablet (20 mg total) by mouth 2 (two) times daily. 60 tablet 2   furosemide (LASIX) 20 MG tablet Take 20 mg by mouth daily.     latanoprost (XALATAN) 0.005 % ophthalmic solution Place 1 drop into both eyes at bedtime.     melatonin 5 MG TABS Take 5 mg by mouth at bedtime as needed.      Multiple Vitamin (MULTIVITAMIN) tablet Take 1 tablet by mouth every morning.     nadolol (CORGARD) 40 MG tablet TAKE 1 AND 1/2 TABLETS DAILY 45 tablet 0   nitroGLYCERIN (NITROSTAT) 0.4 MG SL tablet DISSOLVE 1 TAB UNDER TOUNGE FOR CHEST PAIN. MAY REPEAT EVERY 5 MINUTES FOR 3 DOSES. IF NO RELIEF CALL 911 OR GO TO ER 25 tablet 3   ondansetron (ZOFRAN-ODT) 4 MG disintegrating tablet TAKE 1 TABLET BY MOUTH EVERY 8 HOURS AS NEEDED FOR NAUSEA & VOMITING 20 tablet 0   pantoprazole (PROTONIX) 40 MG tablet TAKE ONE (1) TABLET EACH DAY 90 tablet 0   traMADol (ULTRAM) 50 MG tablet Take 50 mg by mouth every 6 (six) hours as  needed.     Wheat Dextrin (BENEFIBER) POWD Take 1 Dose by mouth daily.     No current facility-administered medications for this visit.   Facility-Administered Medications Ordered in Other Visits  Medication Dose Route Frequency Provider Last Rate Last Admin   bupivacaine liposome (EXPAREL) 1.3 % injection 266 mg  20 mL Infiltration Once [place], Amber, PA-C       Allergies:  Iodinated diagnostic agents and Naproxen   Social History: The patient  reports that she has never smoked. She has never used smokeless tobacco. She reports that she does not drink alcohol and does not use drugs.   Family History: The patient's family history includes Alzheimer's disease in her brother, mother, and sister; Atrial fibrillation in her brother; Colon cancer in her brother, maternal grandfather, and sister; Dementia in her brother; Diabetes in her brother; Heart attack in her paternal grandmother; Heart disease in her brother and paternal grandmother; Parkinson's disease in her brother; Prostate cancer in her son and son; Stomach cancer in her maternal grandmother; Stroke in her mother; Throat cancer in her father.   ROS:  Please see the history of present illness. Otherwise, complete review of systems is positive for none.  All other systems are reviewed and negative.   Physical Exam: VS:  BP  140/80   Pulse 60   Ht 5\' 3"  (1.6 m)   Wt 161 lb 12.8 oz (73.4 kg)   SpO2 92%   BMI 28.66 kg/m , BMI Body mass index is 28.66 kg/m.  Wt Readings from Last 3 Encounters:  03/18/21 161 lb 12.8 oz (73.4 kg)  03/08/21 164 lb (74.4 kg)  02/10/21 160 lb (72.6 kg)    General: Patient appears comfortable at rest. Neck: Supple, no elevated JVP or carotid bruits, no thyromegaly. Lungs: Clear to auscultation, nonlabored breathing at rest. Cardiac: Regular rate and rhythm, no S3 or significant systolic murmur, no pericardial rub. Extremities: No pitting edema, distal pulses 2+. Skin: Warm and dry. Musculoskeletal: No kyphosis. Neuropsychiatric: Alert and oriented x3, affect grossly appropriate.  ECG:  EKG 11/17/2020 atrial fibrillation rate of 60, LVH with IVCD, LAD and secondary repolarization abnormality.  Anterolateral infarct, age indeterminate, prolonged QT  Recent Labwork: 04/28/2020: TSH 2.050 11/17/2020: ALT 19; AST 23 11/18/2020: Hemoglobin 11.5; Platelets 117 03/08/2021: BNP 376.8; BUN 13; Creatinine, Ser 0.85; Potassium 5.1; Sodium 139     Component Value Date/Time   CHOL 173 04/28/2020 1120   TRIG 55 04/28/2020 1120   HDL 54 04/28/2020 1120   CHOLHDL 3.2 04/28/2020 1120   LDLCALC 108 (H) 04/28/2020 1120    Other Studies Reviewed Today:   Lexiscan nuclear stress test 12/15/2020 There was no ST segment deviation noted during stress. The study is normal. This is a low risk study. The left ventricular ejection fraction is normal (55-65%).   Normal resting and stress perfusion. No ischemia or infarction EF 60 % Baseline ECG AV pacing      Echocardiogram 11/18/2020   1. Left ventricular ejection fraction, by estimation, is 55 to 60%. The  left ventricle has normal function. The left ventricle has no regional  wall motion abnormalities. There is moderate left ventricular hypertrophy.  Left ventricular diastolic  parameters are consistent with Grade II diastolic dysfunction   (pseudonormalization).   2. Right ventricular systolic function is normal. The right ventricular  size is normal. There is severely elevated pulmonary artery systolic  pressure. The estimated right ventricular systolic pressure is 17.7 mmHg.   3. Left atrial size  was mildly dilated.   4. Right atrial size was mildly dilated.   5. The mitral valve is degenerative. Mild mitral valve regurgitation.   6. Tricuspid valve regurgitation is moderate.   7. The aortic valve is tricuspid. There is mild calcification of the  aortic valve. Aortic valve regurgitation is not visualized. Mild to  moderate aortic valve sclerosis/calcification is present, without any  evidence of aortic stenosis. Aortic valve mean   gradient measures 7.0 mmHg.   8. The inferior vena cava is dilated in size with >50% respiratory  variability, suggesting right atrial pressure of 8 mmHg.    NST: 04/2014 IMPRESSION: 1. No scintigraphic evidence of prior infarction or pharmacologically induced ischemia.   2. Normal left ventricular wall motion.   3. Left ventricular ejection fraction 68%   4. Low-risk stress test findings  Assessment and Plan:  1. Bilateral lower extremity edema   2. Paroxysmal atrial fibrillation (HCC)   3. Sick sinus syndrome ([place])   4. Essential hypertension    1. Bilateral lower extremity edema/diastolic dysfunction Lower extremity has significantly improved since seeing Ms. Darla Lesches FNP at [employer].  Mrs. Rakes temporarily increased her Lasix dosage for 3 days then reverted back to 20 mg.  Currently has some mild nonpitting edema.  She does have venous disease with significant venous varicosities.  Continue Lasix 20 mg daily. Advised her to restrict sodium intake, restrict fluid intake to about 60 ounces per day and weigh herself every day.  If weight gain of 3 pounds in 24 hours or 5 pounds in 1 week take an extra Lasix as needed.  2. Paroxysmal atrial fibrillation (HCC) Heart  rate today is 60.  Continue amiodarone 200 mg daily, Eliquis 5 mg p.o. twice daily.  3. Sick sinus syndrome (HCC) Recent remote pacemaker check revealed normal device function, battery status, leads stable, histograms were reviewed and appropriate.  She follows with Dr. Rayann Heman.  4. Essential hypertension Blood pressure elevated today 140/80.  Patient states at home her systolic pressures run in the 130-140 range.  Continue nadolol for 60 mg p.o. daily.  Medication Adjustments/Labs and Tests Ordered: Current medicines are reviewed at length with the patient today.  Concerns regarding medicines are outlined above.   Disposition: Follow-up with Dr. Domenic Polite or APP 6 months  Signed, Levell July, NP 03/18/2021 2:17 PM    [facility], [place], [place] [address] Phone: [phone]; Fax: [phone]

## 2021-03-18 ENCOUNTER — Encounter: Payer: Self-pay | Admitting: Family Medicine

## 2021-03-18 ENCOUNTER — Ambulatory Visit: Payer: Medicare Other | Admitting: Family Medicine

## 2021-03-18 VITALS — BP 140/80 | HR 60 | Ht 63.0 in | Wt 161.8 lb

## 2021-03-18 DIAGNOSIS — R600 Localized edema: Secondary | ICD-10-CM

## 2021-03-18 DIAGNOSIS — I480 Paroxysmal atrial fibrillation: Secondary | ICD-10-CM | POA: Diagnosis not present

## 2021-03-18 DIAGNOSIS — I495 Sick sinus syndrome: Secondary | ICD-10-CM | POA: Diagnosis not present

## 2021-03-18 DIAGNOSIS — I10 Essential (primary) hypertension: Secondary | ICD-10-CM

## 2021-03-18 NOTE — Patient Instructions (Signed)
Medication Instructions:  Your physician recommends that you continue on your current medications as directed. Please refer to the Current Medication list given to you today.  Labwork: none  Testing/Procedures: none  Follow-Up: Your physician recommends that you schedule a follow-up appointment in: 6 months. You will receive a reminder call or letter in the mail in about 4 months reminding you to call and schedule your appointment. If you don't receive this letter, please contact our office.  Any Other Special Instructions Will Be Listed Below (If Applicable).  If you need a refill on your cardiac medications before your next appointment, please call your pharmacy.

## 2021-03-31 ENCOUNTER — Ambulatory Visit (INDEPENDENT_AMBULATORY_CARE_PROVIDER_SITE_OTHER): Payer: Medicare Other

## 2021-03-31 DIAGNOSIS — I495 Sick sinus syndrome: Secondary | ICD-10-CM

## 2021-03-31 LAB — CUP PACEART REMOTE DEVICE CHECK
Battery Remaining Longevity: 4 mo
Battery Remaining Percentage: 3 %
Battery Voltage: 2.71 V
Brady Statistic AP VP Percent: 99 %
Brady Statistic AP VS Percent: 1 %
Brady Statistic AS VP Percent: 1 %
Brady Statistic AS VS Percent: 1 %
Brady Statistic RA Percent Paced: 99 %
Brady Statistic RV Percent Paced: 99 %
Date Time Interrogation Session: 20221020024739
Implantable Lead Implant Date: 20031028
Implantable Lead Implant Date: 20031028
Implantable Lead Location: 753859
Implantable Lead Location: 753860
Implantable Pulse Generator Implant Date: 20140107
Lead Channel Impedance Value: 350 Ohm
Lead Channel Impedance Value: 450 Ohm
Lead Channel Pacing Threshold Amplitude: 0.5 V
Lead Channel Pacing Threshold Amplitude: 0.75 V
Lead Channel Pacing Threshold Pulse Width: 0.5 ms
Lead Channel Pacing Threshold Pulse Width: 0.5 ms
Lead Channel Sensing Intrinsic Amplitude: 0.2 mV
Lead Channel Sensing Intrinsic Amplitude: 8.5 mV
Lead Channel Setting Pacing Amplitude: 2 V
Lead Channel Setting Pacing Amplitude: 2.5 V
Lead Channel Setting Pacing Pulse Width: 0.5 ms
Lead Channel Setting Sensing Sensitivity: 2 mV
Pulse Gen Model: 2210
Pulse Gen Serial Number: 7438567

## 2021-04-06 ENCOUNTER — Other Ambulatory Visit: Payer: Self-pay | Admitting: Family Medicine

## 2021-04-07 NOTE — Progress Notes (Signed)
Remote pacemaker transmission.   

## 2021-04-14 DIAGNOSIS — X32XXXD Exposure to sunlight, subsequent encounter: Secondary | ICD-10-CM | POA: Diagnosis not present

## 2021-04-14 DIAGNOSIS — L570 Actinic keratosis: Secondary | ICD-10-CM | POA: Diagnosis not present

## 2021-05-01 ENCOUNTER — Ambulatory Visit: Payer: Medicare Other

## 2021-05-02 ENCOUNTER — Telehealth: Payer: Self-pay | Admitting: Internal Medicine

## 2021-05-02 NOTE — Telephone Encounter (Signed)
  1. Has your device fired? No    2. Is you device beeping? yes  3. Are you experiencing draining or swelling at device site? no  4. Are you calling to see if we received your device transmission? no  5. Have you passed out? no  Patient states she turned on her remote transmission box and it started beep, she had to unplug it in order to get it to stop beeping.   Please route to [place]

## 2021-05-02 NOTE — Telephone Encounter (Signed)
I spoke with the patient and asked her to try doing the transmission with the monitor in her lap. She is going to call me back when she is done.

## 2021-05-03 ENCOUNTER — Ambulatory Visit: Payer: Medicare Other | Admitting: Cardiology

## 2021-05-03 LAB — CUP PACEART REMOTE DEVICE CHECK
Battery Remaining Longevity: 1 mo
Battery Remaining Percentage: 2 %
Battery Voltage: 2.68 V
Brady Statistic AP VP Percent: 99 %
Brady Statistic AP VS Percent: 1 %
Brady Statistic AS VP Percent: 1 %
Brady Statistic AS VS Percent: 1 %
Brady Statistic RA Percent Paced: 99 %
Brady Statistic RV Percent Paced: 99 %
Date Time Interrogation Session: 20221120035847
Implantable Lead Implant Date: 20031028
Implantable Lead Implant Date: 20031028
Implantable Lead Location: 753859
Implantable Lead Location: 753860
Implantable Pulse Generator Implant Date: 20140107
Lead Channel Impedance Value: 380 Ohm
Lead Channel Impedance Value: 480 Ohm
Lead Channel Pacing Threshold Amplitude: 0.5 V
Lead Channel Pacing Threshold Amplitude: 0.75 V
Lead Channel Pacing Threshold Pulse Width: 0.5 ms
Lead Channel Pacing Threshold Pulse Width: 0.5 ms
Lead Channel Sensing Intrinsic Amplitude: 0.2 mV
Lead Channel Sensing Intrinsic Amplitude: 9.5 mV
Lead Channel Setting Pacing Amplitude: 2 V
Lead Channel Setting Pacing Amplitude: 2.5 V
Lead Channel Setting Pacing Pulse Width: 0.5 ms
Lead Channel Setting Sensing Sensitivity: 2 mV
Pulse Gen Model: 2210
Pulse Gen Serial Number: 7438567

## 2021-05-03 NOTE — Telephone Encounter (Signed)
Transmission received 05/01/2021

## 2021-05-04 ENCOUNTER — Other Ambulatory Visit: Payer: Self-pay | Admitting: Family Medicine

## 2021-05-31 ENCOUNTER — Other Ambulatory Visit: Payer: Self-pay | Admitting: Family

## 2021-05-31 ENCOUNTER — Other Ambulatory Visit: Payer: Self-pay | Admitting: Family Medicine

## 2021-06-01 ENCOUNTER — Ambulatory Visit (INDEPENDENT_AMBULATORY_CARE_PROVIDER_SITE_OTHER): Payer: Medicare Other

## 2021-06-01 DIAGNOSIS — I495 Sick sinus syndrome: Secondary | ICD-10-CM

## 2021-06-01 LAB — CUP PACEART REMOTE DEVICE CHECK
Battery Remaining Longevity: 1 mo
Battery Remaining Percentage: 1 %
Battery Voltage: 2.65 V
Brady Statistic AP VP Percent: 99 %
Brady Statistic AP VS Percent: 1 %
Brady Statistic AS VP Percent: 1 %
Brady Statistic AS VS Percent: 1 %
Brady Statistic RA Percent Paced: 99 %
Brady Statistic RV Percent Paced: 99 %
Date Time Interrogation Session: 20221221021232
Implantable Lead Implant Date: 20031028
Implantable Lead Implant Date: 20031028
Implantable Lead Location: 753859
Implantable Lead Location: 753860
Implantable Pulse Generator Implant Date: 20140107
Lead Channel Impedance Value: 330 Ohm
Lead Channel Impedance Value: 450 Ohm
Lead Channel Pacing Threshold Amplitude: 0.5 V
Lead Channel Pacing Threshold Amplitude: 0.75 V
Lead Channel Pacing Threshold Pulse Width: 0.5 ms
Lead Channel Pacing Threshold Pulse Width: 0.5 ms
Lead Channel Sensing Intrinsic Amplitude: 0.2 mV
Lead Channel Sensing Intrinsic Amplitude: 10.3 mV
Lead Channel Setting Pacing Amplitude: 2 V
Lead Channel Setting Pacing Amplitude: 2.5 V
Lead Channel Setting Pacing Pulse Width: 0.5 ms
Lead Channel Setting Sensing Sensitivity: 2 mV
Pulse Gen Model: 2210
Pulse Gen Serial Number: 7438567

## 2021-06-09 NOTE — Progress Notes (Signed)
Remote pacemaker transmission.   

## 2021-06-22 ENCOUNTER — Telehealth: Payer: Self-pay | Admitting: Family Medicine

## 2021-06-22 NOTE — Telephone Encounter (Signed)
°  Prescription Request  06/22/2021  Is this a "Controlled Substance" medicine? no  Have you seen your PCP in the last 2 weeks? no  If YES, route message to pool  -  If NO, patient needs to be scheduled for appointment.  What is the name of the medication or equipment? Nadadol, Eloquis,Furosemide  Have you contacted your pharmacy to request a refill? yes   Which pharmacy would you like this sent to? [facility]   Patient notified that their request is being sent to the clinical staff for review and that they should receive a response within 2 business days.    Dettinger's pt.  She is leaving to go out of town on Friday for 2 months & she wants these filled before she leaves.  She said that they have filled them before.

## 2021-06-22 NOTE — Telephone Encounter (Signed)
Pt is going out of town on the 22nd, in looking at chart she has only had acute visits in the last year. Last chronic ckup was 04/28/20. Appt made for 06/28/21

## 2021-06-28 ENCOUNTER — Encounter: Payer: Self-pay | Admitting: Family Medicine

## 2021-06-28 ENCOUNTER — Ambulatory Visit (INDEPENDENT_AMBULATORY_CARE_PROVIDER_SITE_OTHER): Payer: Medicare Other | Admitting: Family Medicine

## 2021-06-28 VITALS — BP 130/83 | HR 61 | Temp 98.3°F | Ht 63.0 in | Wt 165.0 lb

## 2021-06-28 DIAGNOSIS — E782 Mixed hyperlipidemia: Secondary | ICD-10-CM

## 2021-06-28 DIAGNOSIS — I495 Sick sinus syndrome: Secondary | ICD-10-CM

## 2021-06-28 DIAGNOSIS — E559 Vitamin D deficiency, unspecified: Secondary | ICD-10-CM

## 2021-06-28 DIAGNOSIS — K219 Gastro-esophageal reflux disease without esophagitis: Secondary | ICD-10-CM | POA: Diagnosis not present

## 2021-06-28 DIAGNOSIS — I480 Paroxysmal atrial fibrillation: Secondary | ICD-10-CM

## 2021-06-28 DIAGNOSIS — I10 Essential (primary) hypertension: Secondary | ICD-10-CM

## 2021-06-28 DIAGNOSIS — E538 Deficiency of other specified B group vitamins: Secondary | ICD-10-CM

## 2021-06-28 MED ORDER — FUROSEMIDE 20 MG PO TABS: 20.0000 mg | ORAL_TABLET | Freq: Every day | ORAL | 1 refills | Status: DC

## 2021-06-28 MED ORDER — NADOLOL 40 MG PO TABS: 60.0000 mg | ORAL_TABLET | Freq: Every day | ORAL | 1 refills | Status: DC

## 2021-06-28 MED ORDER — APIXABAN 5 MG PO TABS: 5.0000 mg | ORAL_TABLET | Freq: Two times a day (BID) | ORAL | 1 refills | Status: DC

## 2021-06-28 NOTE — Progress Notes (Signed)
Subjective:  Patient ID: [name], female    DOB: 1928-08-11, 88 y.o.   MRN: [id number]  Patient Care Team: Dettinger, Fransisca Kaufmann, MD as PCP - General (Family Medicine) Satira Sark, MD as PCP - Cardiology (Cardiology) Thompson Grayer, MD as PCP - Electrophysiology (Cardiology) Ilean China, RN as [facility] [place] Comment, Allison Quarry, MD as Referring Physician (Optometry) Steffanie Rainwater, DPM as Consulting Physician (Podiatry) Gatha Mayer, MD as Consulting Physician (Gastroenterology)   Chief Complaint:  Medical Management of Chronic Issues   HPI: [name] is a 88 y.o. female presenting on 06/28/2021 for Medical Management of Chronic Issues   1. Mixed hyperlipidemia Does not follow a specific diet. No regular exercise. Does take benefiber on a daily basis.   2. Essential hypertension, benign Well controlled. On nadolol and amiodarone for A-Fib. Lasix has recently been added due to BLE edema. Tolerating all very well. No headaches, chest pain, increased leg swelling, visual changes, confusion, or focal neurological deficits.   3. Paroxysmal atrial fibrillation (HCC) 4. Sick sinus syndrome ([place]) Has permanent pacemaker and is on Eliquis twice daily. Followed by cardiology on a regular basis. No abnormal bleeding or bruising. No palpitations, chest pain, dizziness, or syncope. No recent falls.   5. Gastroesophageal reflux disease without esophagitis On Protonix and tolerating well. No reported symptoms of esophagitis.   6. Vitamin D deficiency Currently not on repletion therapy, no recent Vit D level.  7. B12 deficiency On repletion therapy once monthly and tolerating well.      Relevant past medical, surgical, family, and social history reviewed and updated as indicated.  Allergies and medications reviewed and updated. Data reviewed: Chart in Epic.   Past Medical History:  Diagnosis Date   Arthritis    Atrial flutter  ([place])    s/p RFA 2003   Breast cancer ([place])    1980's - lumpectomy only   Colon polyps    Diverticulosis    DVT, lower extremity ([place]) 1998 and 1999   Bilateral   Essential hypertension    Gallstones    GERD (gastroesophageal reflux disease)    Glaucoma    IBS (irritable bowel syndrome)    Mixed hyperlipidemia    Osteoporosis    Persistent atrial fibrillation ([place])    Pneumonia 2011   Sick sinus syndrome (HCC)    PPM ([facility])   Urinary tract infection    Varicose veins     Past Surgical History:  Procedure Laterality Date   BREAST LUMPECTOMY  1980's   Right breast    CARDIAC CATHETERIZATION  1999   Normal coronaries   Cataracts Bilateral    CHOLECYSTECTOMY     COLONOSCOPY W/ BIOPSIES AND POLYPECTOMY  03/2003, 05/2008, 02/20/2011   severe diverticulosis, tubulovillous adenoma polyp, internal and external hemorrhoids 2012: severe diverticulosis, 4 mm polyp, hemorrhoids   CYST REMOVAL TRUNK     under breast = on belly    DILATION AND CURETTAGE OF UTERUS     EYE SURGERY     cataracts - bilateral    Fractured wrist Left 2013   repair   INSERT / REPLACE / REMOVE PACEMAKER     KNEE ARTHROSCOPY WITH MEDIAL MENISECTOMY Right 10/21/2014   Procedure: RIGHT KNEE ARTHROSCOPY WITH MEDIAL MENISECTOM,lateral menisectomy,synovectomy suprapatellar pouch;  Surgeon: Latanya Maudlin, MD;  Location: [facility];  Service: Orthopedics;  Laterality: Right;   PACEMAKER GENERATOR CHANGE  06/18/12   [facility] Accent DR RF, Dr Rayann Heman  PACEMAKER INSERTION  2003   PERMANENT PACEMAKER GENERATOR CHANGE N/A 06/18/2012   Procedure: PERMANENT PACEMAKER GENERATOR CHANGE;  Surgeon: Thompson Grayer, MD;  Location: [facility];  Service: Cardiovascular;  Laterality: N/A;   TOTAL KNEE ARTHROPLASTY  12/20/2011   Procedure: TOTAL KNEE ARTHROPLASTY;  Surgeon: Tobi Bastos, MD;  Location: [facility];  Service: Orthopedics;  Laterality: Left;   TOTAL KNEE ARTHROPLASTY Right 02/10/2015   Procedure: TOTAL RIGHT KNEE ARTHROPLASTY;   Surgeon: Latanya Maudlin, MD;  Location: [facility];  Service: Orthopedics;  Laterality: Right;    Social History   Socioeconomic History   Marital status: Widowed    Spouse name: Not on file   Number of children: 5   Years of education: 62   Highest education level: 12th grade  Occupational History   Occupation: Retired    Comment: resturant / cafe   Tobacco Use   Smoking status: Never   Smokeless tobacco: Never  Vaping Use   Vaping Use: Never used  Substance and Sexual Activity   Alcohol use: No    Alcohol/week: 0.0 standard drinks   Drug use: No   Sexual activity: Not on file  Other Topics Concern   Not on file  Social History Narrative   Widowed  - 5 sons = 1 lives local    Retired.   Never smoker no alcohol   Social Determinants of Radio broadcast assistant Strain: Low Risk    Difficulty of Paying Living Expenses: Not hard at all  Food Insecurity: No Food Insecurity   Worried About Charity fundraiser in the Last Year: Never true   [place] in the Last Year: Never true  Transportation Needs: No Transportation Needs   Lack of Transportation (Medical): No   Lack of Transportation (Non-Medical): No  Physical Activity: Inactive   Days of Exercise per Week: 0 days   Minutes of Exercise per Session: 0 min  Stress: No Stress Concern Present   Feeling of Stress : Not at all  Social Connections: Moderately Integrated   Frequency of Communication with Friends and Family: More than three times a week   Frequency of Social Gatherings with Friends and Family: More than three times a week   Attends Religious Services: More than 4 times per year   Active Member of [employer] or Organizations: Yes   Attends Archivist Meetings: More than 4 times per year   Marital Status: Widowed  Intimate Partner Violence: Not At Risk   Fear of Current or Ex-Partner: No   Emotionally Abused: No   Physically Abused: No   Sexually Abused: No    Outpatient Encounter Medications  as of 06/28/2021  Medication Sig   acetaminophen (TYLENOL) 500 MG tablet Take 1,000 mg by mouth every 6 (six) hours as needed for mild pain.   amiodarone (PACERONE) 200 MG tablet TAKE ONE (1) TABLET EACH DAY   calcium-vitamin D (OSCAL WITH D) 500-200 MG-UNIT tablet Take 1 tablet by mouth daily with breakfast.    cyanocobalamin (,VITAMIN B-12,) 1000 MCG/ML injection Inject 1 mL (1,000 mcg total) into the muscle every 30 (thirty) days.   latanoprost (XALATAN) 0.005 % ophthalmic solution Place 1 drop into both eyes at bedtime.   melatonin 5 MG TABS Take 5 mg by mouth at bedtime as needed.   Multiple Vitamin (MULTIVITAMIN) tablet Take 1 tablet by mouth every morning.   nitroGLYCERIN (NITROSTAT) 0.4 MG SL tablet DISSOLVE 1 TAB UNDER TOUNGE FOR CHEST PAIN.  MAY REPEAT EVERY 5 MINUTES FOR 3 DOSES. IF NO RELIEF CALL 911 OR GO TO ER   ondansetron (ZOFRAN-ODT) 4 MG disintegrating tablet TAKE 1 TABLET BY MOUTH EVERY 8 HOURS AS NEEDED FOR NAUSEA & VOMITING   pantoprazole (PROTONIX) 40 MG tablet TAKE ONE (1) TABLET EACH DAY   traMADol (ULTRAM) 50 MG tablet Take 50 mg by mouth every 6 (six) hours as needed.   Wheat Dextrin (BENEFIBER) POWD Take 1 Dose by mouth daily.   [DISCONTINUED] ELIQUIS 5 MG TABS tablet TAKE ONE TABLET BY MOUTH TWICE DAILY   [DISCONTINUED] famotidine (PEPCID) 20 MG tablet Take 1 tablet (20 mg total) by mouth 2 (two) times daily.   [DISCONTINUED] furosemide (LASIX) 20 MG tablet Take 20 mg by mouth daily.   [DISCONTINUED] nadolol (CORGARD) 40 MG tablet TAKE 1 AND 1/2 TABLETS DAILY   apixaban (ELIQUIS) 5 MG TABS tablet Take 1 tablet (5 mg total) by mouth 2 (two) times daily.   furosemide (LASIX) 20 MG tablet Take 1 tablet (20 mg total) by mouth daily.   nadolol (CORGARD) 40 MG tablet Take 1.5 tablets (60 mg total) by mouth daily.   Facility-Administered Encounter Medications as of 06/28/2021  Medication   bupivacaine liposome (EXPAREL) 1.3 % injection 266 mg    Allergies  Allergen  Reactions   Iodinated Contrast Media Hives, Itching and Rash    03/13/14: symptoms began within two hours of intrathecal injection (myelogram 03/11/14); on shoulders radiating down to stomach.  Relief with Benadryl.  Told patient she would need to pre-med with Benadryl in the future before receiving this contrast.  Brita Romp, RN   Naproxen Hives, Itching and Rash    Review of Systems  Constitutional:  Negative for activity change, appetite change, chills, diaphoresis, fatigue, fever and unexpected weight change.  HENT: Negative.  Negative for sore throat, trouble swallowing and voice change.   Eyes: Negative.   Respiratory:  Negative for apnea, cough, choking, chest tightness, shortness of breath, wheezing and stridor.   Cardiovascular:  Positive for leg swelling. Negative for chest pain and palpitations.  Gastrointestinal:  Negative for abdominal pain, blood in stool, constipation, diarrhea, nausea and vomiting.  Endocrine: Negative.   Genitourinary:  Negative for decreased urine volume, difficulty urinating, dysuria, frequency and urgency.  Musculoskeletal:  Positive for gait problem (unsteady, uses walker). Negative for arthralgias and myalgias.  Skin: Negative.   Allergic/Immunologic: Negative.   Neurological:  Positive for weakness (generalized, not new or woresning). Negative for dizziness and headaches.  Hematological: Negative.   Psychiatric/Behavioral:  Negative for confusion, hallucinations, sleep disturbance and suicidal ideas.   All other systems reviewed and are negative.      Objective:  BP 130/83    Pulse 61    Temp 98.3 F (36.8 C)    Ht $R'5\' 3"'OH$  (1.6 m)    Wt 165 lb (74.8 kg)    SpO2 95%    BMI 29.23 kg/m    Wt Readings from Last 3 Encounters:  06/28/21 165 lb (74.8 kg)  03/18/21 161 lb 12.8 oz (73.4 kg)  03/08/21 164 lb (74.4 kg)    Physical Exam Vitals and nursing note reviewed.  Constitutional:      General: She is not in acute distress.    Appearance: Normal  appearance. She is well-developed, well-groomed and overweight. She is not ill-appearing, toxic-appearing or diaphoretic.  HENT:     Head: Normocephalic and atraumatic.     Jaw: There is normal jaw occlusion.  Right Ear: Hearing normal.     Left Ear: Hearing normal.     Nose: Nose normal.     Mouth/Throat:     Lips: Pink.     Mouth: Mucous membranes are moist.     Pharynx: Oropharynx is clear. Uvula midline.  Eyes:     General: Lids are normal.     Extraocular Movements: Extraocular movements intact.     Conjunctiva/sclera: Conjunctivae normal.     Pupils: Pupils are equal, round, and reactive to light.  Neck:     Thyroid: No thyroid mass, thyromegaly or thyroid tenderness.     Vascular: No carotid bruit or JVD.     Trachea: Trachea and phonation normal.  Cardiovascular:     Rate and Rhythm: Normal rate and regular rhythm.     Chest Wall: PMI is not displaced.     Pulses: Normal pulses.     Heart sounds: Murmur heard.  Systolic murmur is present with a grade of 1/6.    No friction rub. No gallop.     Comments: PPM pocket right upper chest normal Pulmonary:     Effort: Pulmonary effort is normal. No respiratory distress.     Breath sounds: Normal breath sounds. No wheezing.  Abdominal:     General: Bowel sounds are normal. There is no distension or abdominal bruit.     Palpations: Abdomen is soft. There is no hepatomegaly or splenomegaly.     Tenderness: There is no abdominal tenderness. There is no right CVA tenderness or left CVA tenderness.     Hernia: No hernia is present.  Musculoskeletal:        General: Normal range of motion.     Cervical back: Normal range of motion and neck supple.     Right lower leg: 1+ Edema present.     Left lower leg: 1+ Edema present.  Lymphadenopathy:     Cervical: No cervical adenopathy.  Skin:    General: Skin is warm and dry.     Capillary Refill: Capillary refill takes less than 2 seconds.     Coloration: Skin is not cyanotic,  jaundiced or pale.     Findings: No rash.  Neurological:     General: No focal deficit present.     Mental Status: She is alert and oriented to person, place, and time.     Sensory: Sensation is intact.     Motor: Motor function is intact.     Coordination: Coordination is intact.     Gait: Gait abnormal (using walker, slow and unsteady).     Deep Tendon Reflexes: Reflexes are normal and symmetric.  Psychiatric:        Attention and Perception: Attention and perception normal.        Mood and Affect: Mood and affect normal.        Speech: Speech normal.        Behavior: Behavior normal. Behavior is cooperative.        Thought Content: Thought content normal.        Cognition and Memory: Cognition and memory normal.        Judgment: Judgment normal.    Results for orders placed or performed in visit on 06/01/21  [facility] [place]  Result Value Ref Range   Date Time Interrogation Session [id number]    Pulse Generator Manufacturer [employer]    Pulse Gen Model 2210 Accent DR RF    Pulse Gen Serial Number [id number]    Clinic  Name [place] Pulse Generator Type Implantable Pulse Generator    Implantable Pulse Generator Implant Date [id number]    Implantable Lead Manufacturer [employer]    Implantable Lead Model 1488TC Tendril SDX    Implantable Lead Serial Number [id number]    Implantable Lead Implant Date [id number]    Implantable Lead Location Detail 1 APPENDAGE    Implantable Lead Location [id number]    Implantable Lead Manufacturer [employer]    Implantable Lead Model 1488TC Tendril SDX    Implantable Lead Serial Number [id number]    Implantable Lead Implant Date [id number]    Implantable Lead Location Detail 1 APEX    Implantable Lead Location [phone]    Lead Channel Setting Sensing Sensitivity 2.0 mV   Lead Channel Setting Sensing Adaptation Mode Fixed Pacing    Lead Channel Setting Pacing Amplitude 2.0 V   Lead Channel Setting Pacing Pulse Width 0.5 ms   Lead Channel  Setting Pacing Amplitude 2.5 V   Lead Channel Status     Lead Channel Impedance Value 330 ohm   Lead Channel Sensing Intrinsic Amplitude 0.2 mV   Lead Channel Pacing Threshold Amplitude 0.5 V   Lead Channel Pacing Threshold Pulse Width 0.5 ms   Lead Channel Status     Lead Channel Impedance Value 450 ohm   Lead Channel Sensing Intrinsic Amplitude 10.3 mV   Lead Channel Pacing Threshold Amplitude 0.75 V   Lead Channel Pacing Threshold Pulse Width 0.5 ms   Battery Status MOS    Battery Remaining Longevity 1 mo   Battery Remaining Percentage 1.0 %   Battery Voltage 2.65 V   Brady Statistic RA Percent Paced 99.0 %   Brady Statistic RV Percent Paced 99.0 %   Brady Statistic AP VP Percent 99.0 %   Brady Statistic AS VP Percent 1.0 %   Brady Statistic AP VS Percent 1.0 %   Brady Statistic AS VS Percent 1.0 %       Pertinent labs & imaging results that were available during my care of the patient were reviewed by me and considered in my medical decision making.  Assessment & Plan:  [place] was seen today for medical management of chronic issues.  Diagnoses and all orders for this visit:  Mixed hyperlipidemia Labs pending. Diet and exercise encouraged.  -     CMP14+EGFR -     Lipid panel  Essential hypertension, benign Well controlled, continue current regimen. DASH diet and exercise encouraged. Will obtain below labs.  -     CBC with Differential/Platelet -     CMP14+EGFR -     Lipid panel -     Thyroid Panel With TSH -     furosemide (LASIX) 20 MG tablet; Take 1 tablet (20 mg total) by mouth daily.  Paroxysmal atrial fibrillation ([place]) Followed by cardiology on a regular basis. Rate well controlled with current regimen. Will check below labs including thyroid function due to amiodarone therapy.  -     CBC with Differential/Platelet -     CMP14+EGFR -     Lipid panel -     Thyroid Panel With TSH -     Vitamin B12 -     nadolol (CORGARD) 40 MG tablet; Take 1.5  tablets (60 mg total) by mouth daily. -     apixaban (ELIQUIS) 5 MG TABS tablet; Take 1 tablet (5 mg total) by mouth 2 (two) times daily.  Sick sinus syndrome ([place]) Followed by cardiology on a regular  basis. Has PPM. Will check below labs.  -     CBC with Differential/Platelet -     CMP14+EGFR -     Lipid panel -     Thyroid Panel With TSH  Gastroesophageal reflux disease without esophagitis No signs of esophagitis. Will check CBC. Continue PPI therapy/  -     CBC with Differential/Platelet  Vitamin D deficiency Will check labs today and restart repletion therapy if warranted.  -     CBC with Differential/Platelet -     VITAMIN D 25 Hydroxy (Vit-D Deficiency, Fractures)  B12 deficiency Will check labs today and adjust repletion therapy if warranted.  -     CBC with Differential/Platelet -     Vitamin B12     Continue all other maintenance medications.  Follow up plan: Return in about 6 months (around 12/26/2021), or if symptoms worsen or fail to improve, for PCP.   Continue healthy lifestyle choices, including diet (rich in fruits, vegetables, and lean proteins, and low in salt and simple carbohydrates) and exercise (at least 30 minutes of moderate physical activity daily).  Educational handout given for health maintenance   The above assessment and management plan was discussed with the patient. The patient verbalized understanding of and has agreed to the management plan. Patient is aware to call the clinic if they develop any new symptoms or if symptoms persist or worsen. Patient is aware when to return to the clinic for a follow-up visit. Patient educated on when it is appropriate to go to the emergency department.   Monia Pouch, FNP-C [facility] [phone]

## 2021-06-29 ENCOUNTER — Telehealth: Payer: Self-pay | Admitting: Internal Medicine

## 2021-06-29 ENCOUNTER — Other Ambulatory Visit: Payer: Self-pay | Admitting: *Deleted

## 2021-06-29 DIAGNOSIS — R899 Unspecified abnormal finding in specimens from other organs, systems and tissues: Secondary | ICD-10-CM

## 2021-06-29 LAB — CBC WITH DIFFERENTIAL/PLATELET
Basophils Absolute: 0 10*3/uL (ref 0.0–0.2)
Basos: 1 %
EOS (ABSOLUTE): 0.2 10*3/uL (ref 0.0–0.4)
Eos: 4 %
Hematocrit: 29.9 % — ABNORMAL LOW (ref 34.0–46.6)
Hemoglobin: 9.8 g/dL — ABNORMAL LOW (ref 11.1–15.9)
Immature Grans (Abs): 0 10*3/uL (ref 0.0–0.1)
Immature Granulocytes: 0 %
Lymphocytes Absolute: 1.3 10*3/uL (ref 0.7–3.1)
Lymphs: 33 %
MCH: 30.1 pg (ref 26.6–33.0)
MCHC: 32.8 g/dL (ref 31.5–35.7)
MCV: 92 fL (ref 79–97)
Monocytes Absolute: 0.6 10*3/uL (ref 0.1–0.9)
Monocytes: 14 %
Neutrophils Absolute: 1.9 10*3/uL (ref 1.4–7.0)
Neutrophils: 48 %
Platelets: 177 10*3/uL (ref 150–450)
RBC: 3.26 x10E6/uL — ABNORMAL LOW (ref 3.77–5.28)
RDW: 13.5 % (ref 11.7–15.4)
WBC: 4 10*3/uL (ref 3.4–10.8)

## 2021-06-29 LAB — CMP14+EGFR
ALT: 23 IU/L (ref 0–32)
AST: 25 IU/L (ref 0–40)
Albumin/Globulin Ratio: 1.9 (ref 1.2–2.2)
Albumin: 4.2 g/dL (ref 3.5–4.6)
Alkaline Phosphatase: 70 IU/L (ref 44–121)
BUN/Creatinine Ratio: 17 (ref 12–28)
BUN: 16 mg/dL (ref 10–36)
Bilirubin Total: 0.5 mg/dL (ref 0.0–1.2)
CO2: 25 mmol/L (ref 20–29)
Calcium: 9.3 mg/dL (ref 8.7–10.3)
Chloride: 100 mmol/L (ref 96–106)
Creatinine, Ser: 0.92 mg/dL (ref 0.57–1.00)
Globulin, Total: 2.2 g/dL (ref 1.5–4.5)
Glucose: 93 mg/dL (ref 70–99)
Potassium: 4.7 mmol/L (ref 3.5–5.2)
Sodium: 138 mmol/L (ref 134–144)
Total Protein: 6.4 g/dL (ref 6.0–8.5)
eGFR: 59 mL/min/1.73 — ABNORMAL LOW (ref >59)

## 2021-06-29 LAB — LIPID PANEL
Chol/HDL Ratio: 3.2 ratio (ref 0.0–4.4)
Cholesterol, Total: 155 mg/dL (ref 100–199)
HDL: 49 mg/dL (ref >39)
LDL Chol Calc (NIH): 88 mg/dL (ref 0–99)
Triglycerides: 100 mg/dL (ref 0–149)
VLDL Cholesterol Cal: 18 mg/dL (ref 5–40)

## 2021-06-29 LAB — VITAMIN D 25 HYDROXY (VIT D DEFICIENCY, FRACTURES): Vit D, 25-Hydroxy: 55.1 ng/mL (ref 30.0–100.0)

## 2021-06-29 LAB — THYROID PANEL WITH TSH
Free Thyroxine Index: 3.9 (ref 1.2–4.9)
T3 Uptake Ratio: 32 % (ref 24–39)
T4, Total: 12.1 ug/dL — ABNORMAL HIGH (ref 4.5–12.0)
TSH: 2.04 u[IU]/mL (ref 0.450–4.500)

## 2021-06-29 LAB — VITAMIN B12: Vitamin B-12: 1223 pg/mL (ref 232–1245)

## 2021-06-29 NOTE — Telephone Encounter (Signed)
I spoke with the patient and called tech support. They are sending her a cell adapter to her son address.

## 2021-06-29 NOTE — Telephone Encounter (Signed)
Patient is going to be out of town for a couple of months.  She wants to take her transmission box with her however, he son doesn't have a jack where it can be connected to.  He is wondering if it can be connected via cell phone.

## 2021-07-01 ENCOUNTER — Other Ambulatory Visit: Payer: Medicare Other

## 2021-07-01 DIAGNOSIS — R899 Unspecified abnormal finding in specimens from other organs, systems and tissues: Secondary | ICD-10-CM

## 2021-07-02 ENCOUNTER — Encounter: Payer: Self-pay | Admitting: Family Medicine

## 2021-07-02 ENCOUNTER — Emergency Department (HOSPITAL_COMMUNITY): Payer: Medicare Other

## 2021-07-02 ENCOUNTER — Inpatient Hospital Stay (HOSPITAL_COMMUNITY)
Admission: EM | Admit: 2021-07-02 | Discharge: 2021-07-05 | DRG: 291 | Disposition: A | Discharge status: Home Health | Payer: Medicare Other | Attending: Internal Medicine | Admitting: Internal Medicine

## 2021-07-02 ENCOUNTER — Other Ambulatory Visit: Payer: Self-pay

## 2021-07-02 DIAGNOSIS — E782 Mixed hyperlipidemia: Secondary | ICD-10-CM | POA: Diagnosis not present

## 2021-07-02 DIAGNOSIS — K3189 Other diseases of stomach and duodenum: Secondary | ICD-10-CM

## 2021-07-02 DIAGNOSIS — I495 Sick sinus syndrome: Secondary | ICD-10-CM | POA: Diagnosis present

## 2021-07-02 DIAGNOSIS — Z79890 Hormone replacement therapy (postmenopausal): Secondary | ICD-10-CM | POA: Diagnosis not present

## 2021-07-02 DIAGNOSIS — Z79899 Other long term (current) drug therapy: Secondary | ICD-10-CM | POA: Diagnosis not present

## 2021-07-02 DIAGNOSIS — H409 Unspecified glaucoma: Secondary | ICD-10-CM | POA: Diagnosis not present

## 2021-07-02 DIAGNOSIS — Z8249 Family history of ischemic heart disease and other diseases of the circulatory system: Secondary | ICD-10-CM

## 2021-07-02 DIAGNOSIS — Z20822 Contact with and (suspected) exposure to covid-19: Secondary | ICD-10-CM | POA: Diagnosis present

## 2021-07-02 DIAGNOSIS — M1990 Unspecified osteoarthritis, unspecified site: Secondary | ICD-10-CM | POA: Diagnosis present

## 2021-07-02 DIAGNOSIS — I2510 Atherosclerotic heart disease of native coronary artery without angina pectoris: Secondary | ICD-10-CM | POA: Diagnosis not present

## 2021-07-02 DIAGNOSIS — I480 Paroxysmal atrial fibrillation: Secondary | ICD-10-CM | POA: Diagnosis not present

## 2021-07-02 DIAGNOSIS — K319 Disease of stomach and duodenum, unspecified: Secondary | ICD-10-CM | POA: Diagnosis not present

## 2021-07-02 DIAGNOSIS — M810 Age-related osteoporosis without current pathological fracture: Secondary | ICD-10-CM | POA: Diagnosis not present

## 2021-07-02 DIAGNOSIS — K2950 Unspecified chronic gastritis without bleeding: Secondary | ICD-10-CM | POA: Diagnosis not present

## 2021-07-02 DIAGNOSIS — I10 Essential (primary) hypertension: Secondary | ICD-10-CM | POA: Diagnosis not present

## 2021-07-02 DIAGNOSIS — K219 Gastro-esophageal reflux disease without esophagitis: Secondary | ICD-10-CM | POA: Diagnosis not present

## 2021-07-02 DIAGNOSIS — D509 Iron deficiency anemia, unspecified: Secondary | ICD-10-CM | POA: Diagnosis present

## 2021-07-02 DIAGNOSIS — Z950 Presence of cardiac pacemaker: Secondary | ICD-10-CM | POA: Diagnosis not present

## 2021-07-02 DIAGNOSIS — I110 Hypertensive heart disease with heart failure: Principal | ICD-10-CM | POA: Diagnosis present

## 2021-07-02 DIAGNOSIS — R0602 Shortness of breath: Secondary | ICD-10-CM | POA: Diagnosis not present

## 2021-07-02 DIAGNOSIS — I8290 Acute embolism and thrombosis of unspecified vein: Secondary | ICD-10-CM | POA: Diagnosis not present

## 2021-07-02 DIAGNOSIS — Z8701 Personal history of pneumonia (recurrent): Secondary | ICD-10-CM | POA: Diagnosis not present

## 2021-07-02 DIAGNOSIS — Z853 Personal history of malignant neoplasm of breast: Secondary | ICD-10-CM

## 2021-07-02 DIAGNOSIS — Z96651 Presence of right artificial knee joint: Secondary | ICD-10-CM | POA: Diagnosis not present

## 2021-07-02 DIAGNOSIS — I4819 Other persistent atrial fibrillation: Secondary | ICD-10-CM | POA: Diagnosis present

## 2021-07-02 DIAGNOSIS — I509 Heart failure, unspecified: Secondary | ICD-10-CM | POA: Diagnosis not present

## 2021-07-02 DIAGNOSIS — Z87440 Personal history of urinary (tract) infections: Secondary | ICD-10-CM

## 2021-07-02 DIAGNOSIS — Z888 Allergy status to other drugs, medicaments and biological substances status: Secondary | ICD-10-CM

## 2021-07-02 DIAGNOSIS — K449 Diaphragmatic hernia without obstruction or gangrene: Secondary | ICD-10-CM | POA: Diagnosis present

## 2021-07-02 DIAGNOSIS — Z86718 Personal history of other venous thrombosis and embolism: Secondary | ICD-10-CM | POA: Diagnosis not present

## 2021-07-02 DIAGNOSIS — D649 Anemia, unspecified: Secondary | ICD-10-CM

## 2021-07-02 DIAGNOSIS — Z7901 Long term (current) use of anticoagulants: Secondary | ICD-10-CM

## 2021-07-02 DIAGNOSIS — Z91041 Radiographic dye allergy status: Secondary | ICD-10-CM

## 2021-07-02 DIAGNOSIS — R109 Unspecified abdominal pain: Secondary | ICD-10-CM | POA: Diagnosis not present

## 2021-07-02 DIAGNOSIS — I5033 Acute on chronic diastolic (congestive) heart failure: Secondary | ICD-10-CM | POA: Diagnosis not present

## 2021-07-02 DIAGNOSIS — R5383 Other fatigue: Secondary | ICD-10-CM | POA: Diagnosis not present

## 2021-07-02 DIAGNOSIS — I5031 Acute diastolic (congestive) heart failure: Secondary | ICD-10-CM | POA: Diagnosis not present

## 2021-07-02 DIAGNOSIS — K31A19 Gastric intestinal metaplasia without dysplasia, unspecified site: Secondary | ICD-10-CM | POA: Diagnosis not present

## 2021-07-02 DIAGNOSIS — R195 Other fecal abnormalities: Secondary | ICD-10-CM | POA: Diagnosis not present

## 2021-07-02 DIAGNOSIS — E538 Deficiency of other specified B group vitamins: Secondary | ICD-10-CM | POA: Diagnosis present

## 2021-07-02 DIAGNOSIS — I517 Cardiomegaly: Secondary | ICD-10-CM | POA: Diagnosis not present

## 2021-07-02 DIAGNOSIS — K5790 Diverticulosis of intestine, part unspecified, without perforation or abscess without bleeding: Secondary | ICD-10-CM | POA: Diagnosis not present

## 2021-07-02 LAB — COMPREHENSIVE METABOLIC PANEL
ALT: 18 U/L (ref 0–44)
AST: 32 U/L (ref 15–41)
Albumin: 3.6 g/dL (ref 3.5–5.0)
Alkaline Phosphatase: 56 U/L (ref 38–126)
Anion gap: 7 (ref 5–15)
BUN: 21 mg/dL (ref 8–23)
CO2: 26 mmol/L (ref 22–32)
Calcium: 9.1 mg/dL (ref 8.9–10.3)
Chloride: 105 mmol/L (ref 98–111)
Creatinine, Ser: 0.93 mg/dL (ref 0.44–1.00)
GFR, Estimated: 58 mL/min — ABNORMAL LOW (ref >60)
Glucose, Bld: 110 mg/dL — ABNORMAL HIGH (ref 70–99)
Potassium: 4.3 mmol/L (ref 3.5–5.1)
Sodium: 138 mmol/L (ref 135–145)
Total Bilirubin: 1.6 mg/dL — ABNORMAL HIGH (ref 0.3–1.2)
Total Protein: 6.2 g/dL — ABNORMAL LOW (ref 6.5–8.1)

## 2021-07-02 LAB — FOLATE: Folate: 30.4 ng/mL (ref >5.9)

## 2021-07-02 LAB — TYPE AND SCREEN
ABO/RH(D): A POS
Antibody Screen: NEGATIVE

## 2021-07-02 LAB — IRON AND TIBC
Iron: 20 ug/dL — ABNORMAL LOW (ref 28–170)
Saturation Ratios: 5 % — ABNORMAL LOW (ref 10.4–31.8)
TIBC: 384 ug/dL (ref 250–450)
UIBC: 364 ug/dL

## 2021-07-02 LAB — CBC WITH DIFFERENTIAL/PLATELET
Abs Immature Granulocytes: 0.01 10*3/uL (ref 0.00–0.07)
Basophils Absolute: 0 10*3/uL (ref 0.0–0.1)
Basophils Relative: 1 %
Eosinophils Absolute: 0.2 10*3/uL (ref 0.0–0.5)
Eosinophils Relative: 4 %
HCT: 28.8 % — ABNORMAL LOW (ref 36.0–46.0)
Hemoglobin: 8.8 g/dL — ABNORMAL LOW (ref 12.0–15.0)
Immature Granulocytes: 0 %
Lymphocytes Relative: 30 %
Lymphs Abs: 1.3 10*3/uL (ref 0.7–4.0)
MCH: 29.4 pg (ref 26.0–34.0)
MCHC: 30.6 g/dL (ref 30.0–36.0)
MCV: 96.3 fL (ref 80.0–100.0)
Monocytes Absolute: 0.6 10*3/uL (ref 0.1–1.0)
Monocytes Relative: 14 %
Neutro Abs: 2.3 10*3/uL (ref 1.7–7.7)
Neutrophils Relative %: 51 %
Platelets: 149 10*3/uL — ABNORMAL LOW (ref 150–400)
RBC: 2.99 MIL/uL — ABNORMAL LOW (ref 3.87–5.11)
RDW: 14.6 % (ref 11.5–15.5)
WBC: 4.4 10*3/uL (ref 4.0–10.5)
nRBC: 0 % (ref 0.0–0.2)

## 2021-07-02 LAB — BRAIN NATRIURETIC PEPTIDE: B Natriuretic Peptide: 450.3 pg/mL — ABNORMAL HIGH (ref 0.0–100.0)

## 2021-07-02 LAB — TROPONIN I (HIGH SENSITIVITY)
Troponin I (High Sensitivity): 13 ng/L (ref <18)
Troponin I (High Sensitivity): 16 ng/L (ref <18)

## 2021-07-02 LAB — RESP PANEL BY RT-PCR (FLU A&B, COVID) ARPGX2
Influenza A by PCR: NEGATIVE
Influenza B by PCR: NEGATIVE
SARS Coronavirus 2 by RT PCR: NEGATIVE

## 2021-07-02 LAB — RETICULOCYTES
Immature Retic Fract: 25.3 % — ABNORMAL HIGH (ref 2.3–15.9)
RBC.: 2.17 MIL/uL — ABNORMAL LOW (ref 3.87–5.11)
Retic Count, Absolute: 42.7 10*3/uL (ref 19.0–186.0)
Retic Ct Pct: 2 % (ref 0.4–3.1)

## 2021-07-02 LAB — POC OCCULT BLOOD, ED: Fecal Occult Bld: NEGATIVE

## 2021-07-02 LAB — FERRITIN: Ferritin: 8 ng/mL — ABNORMAL LOW (ref 11–307)

## 2021-07-02 LAB — VITAMIN B12: Vitamin B-12: 542 pg/mL (ref 180–914)

## 2021-07-02 MED ORDER — PANTOPRAZOLE SODIUM 40 MG PO TBEC
40.0000 mg | DELAYED_RELEASE_TABLET | Freq: Two times a day (BID) | ORAL | Status: DC
  Administered 2021-07-03 – 2021-07-05 (×5): 40 mg via ORAL
  Filled 2021-07-02 (×5): qty 1

## 2021-07-02 MED ORDER — AMIODARONE HCL 200 MG PO TABS
200.0000 mg | ORAL_TABLET | Freq: Every day | ORAL | Status: DC
  Administered 2021-07-03 – 2021-07-05 (×3): 200 mg via ORAL
  Filled 2021-07-02 (×3): qty 1

## 2021-07-02 MED ORDER — OYSTER SHELL CALCIUM/D3 500-5 MG-MCG PO TABS
1.0000 | ORAL_TABLET | Freq: Every day | ORAL | Status: DC
  Administered 2021-07-03 – 2021-07-05 (×3): 1 via ORAL
  Filled 2021-07-02 (×4): qty 1

## 2021-07-02 MED ORDER — ADULT MULTIVITAMIN W/MINERALS CH
1.0000 | ORAL_TABLET | Freq: Every morning | ORAL | Status: DC
  Administered 2021-07-03 – 2021-07-05 (×3): 1 via ORAL
  Filled 2021-07-02 (×3): qty 1

## 2021-07-02 MED ORDER — ONDANSETRON HCL 4 MG PO TABS
4.0000 mg | ORAL_TABLET | Freq: Four times a day (QID) | ORAL | Status: DC | PRN
Start: 2021-07-02 — End: 2021-07-05

## 2021-07-02 MED ORDER — NITROGLYCERIN 0.4 MG SL SUBL: 0.4000 mg | SUBLINGUAL_TABLET | SUBLINGUAL | Status: DC | PRN

## 2021-07-02 MED ORDER — NADOLOL 40 MG PO TABS
60.0000 mg | ORAL_TABLET | Freq: Every day | ORAL | Status: DC
  Administered 2021-07-03 – 2021-07-05 (×3): 60 mg via ORAL
  Filled 2021-07-02 (×3): qty 1

## 2021-07-02 MED ORDER — ONDANSETRON HCL 4 MG/2ML IJ SOLN: 4.0000 mg | Freq: Four times a day (QID) | INTRAMUSCULAR | Status: DC | PRN

## 2021-07-02 MED ORDER — MELATONIN 5 MG PO TABS
5.0000 mg | ORAL_TABLET | Freq: Every evening | ORAL | Status: DC | PRN
  Filled 2021-07-02: qty 1

## 2021-07-02 MED ORDER — SODIUM CHLORIDE 0.9 % IV SOLN
125.0000 mg | Freq: Once | INTRAVENOUS | Status: AC
  Administered 2021-07-03: 125 mg via INTRAVENOUS
  Filled 2021-07-02: qty 10

## 2021-07-02 MED ORDER — APIXABAN 5 MG PO TABS
5.0000 mg | ORAL_TABLET | Freq: Two times a day (BID) | ORAL | Status: DC
  Administered 2021-07-03: 5 mg via ORAL
  Filled 2021-07-02: qty 1

## 2021-07-02 MED ORDER — PANTOPRAZOLE SODIUM 40 MG PO TBEC: 40.0000 mg | DELAYED_RELEASE_TABLET | Freq: Every day | ORAL | Status: DC

## 2021-07-02 MED ORDER — FUROSEMIDE 10 MG/ML IJ SOLN
40.0000 mg | Freq: Two times a day (BID) | INTRAMUSCULAR | Status: DC
  Administered 2021-07-02 – 2021-07-03 (×2): 40 mg via INTRAVENOUS
  Filled 2021-07-02 (×2): qty 4

## 2021-07-02 MED ORDER — LATANOPROST 0.005 % OP SOLN
1.0000 [drp] | Freq: Every day | OPHTHALMIC | Status: DC
  Administered 2021-07-03 – 2021-07-04 (×3): 1 [drp] via OPHTHALMIC
  Filled 2021-07-02: qty 2.5

## 2021-07-02 MED ORDER — TRAMADOL HCL 50 MG PO TABS: 50.0000 mg | ORAL_TABLET | Freq: Four times a day (QID) | ORAL | Status: DC | PRN

## 2021-07-02 MED ORDER — ACETAMINOPHEN 500 MG PO TABS
1000.0000 mg | ORAL_TABLET | Freq: Four times a day (QID) | ORAL | Status: DC | PRN
  Administered 2021-07-03: 1000 mg via ORAL
  Filled 2021-07-02: qty 2

## 2021-07-02 NOTE — Assessment & Plan Note (Signed)
BP stable-continue nadolol.  Follow BP trend and adjust medications accordingly.

## 2021-07-02 NOTE — ED Notes (Signed)
Pt transported to CT via stretcher at this time.  

## 2021-07-02 NOTE — H&P (Signed)
HISTORY AND PHYSICAL       PATIENT DETAILS Name: [name] Age: 88 y.o. Sex: female Date of Birth: 1928-07-19 Admit Date: 07/02/2021 [id number], Fransisca Kaufmann, MD   Patient coming from: Home   CHIEF COMPLAINT:  Fatigue, exertional dyspnea, worsening lower extremity edema-for approximately 1 week  HPI: [name] is a 88 y.o. female with medical history significant of HFpEF, PAF, sick sinus syndrome s/p PPM implantation, VTE on anticoagulation with Eliquis-presented to the ED with the above noted complaints.  Approximately a week or so-patient has been noticing that she is more fatigued than usual.  This is associated with worsening of lower extremity edema.  She has also noticed that she has exertional dyspnea.  She at baseline has 1 pillow orthopnea that is essentially unchanged.  She was seen by her primary care practitioner several days back and was noted to have a decreased hemoglobin of 9.8 on 1/17 (prior Hb 11.5 in June 2022).  Her hemoglobin was again repeated yesterday and it came back as 8.6.  Her primary care practitioner then referred her to the ED to ensure that she was not bleeding and for further evaluation.  Patient denies any dark or bloody stools.  She denies hematemesis/coffee-ground emesis.  She does have an occasional cough but no fever or URI symptoms.  She denies any headache, chest pain, abdominal pain, nausea, vomiting or diarrhea  ED Course: [facility] consulted for admission.  Note: Lives at: Home-lives alone-fairly independent Mobility: Independent Chronic Indwelling Foley:no   REVIEW OF SYSTEMS:  Constitutional:   No  weight loss, night sweats,  Fevers, chills, fatigue.  HEENT:    No headaches, Dysphagia,Tooth/dental problems,Sore throat,  No sneezing, itching, ear ache, nasal congestion, post nasal drip  Cardio-vascular: No chest pain,Orthopnea, PND,lower extremity edema, anasarca, palpitations  GI:  No heartburn,  indigestion, abdominal pain, nausea, vomiting, diarrhea, melena or hematochezia  Resp: No  hemoptysis,plueritic chest pain.   Skin:  No rash or lesions.  GU:  No dysuria, change in color of urine, no urgency or frequency.  No flank pain.  Musculoskeletal: No joint pain or swelling.  No decreased range of motion.  No back pain.  Endocrine: No heat intolerance, no cold intolerance, no polyuria, no polydipsia  Psych: No change in mood or affect. No depression or anxiety.  No memory loss.   ALLERGIES:   Allergies  Allergen Reactions   Iodinated Contrast Media Hives, Itching and Rash    03/13/14: symptoms began within two hours of intrathecal injection (myelogram 03/11/14); on shoulders radiating down to stomach.  Relief with Benadryl.  Told patient she would need to pre-med with Benadryl in the future before receiving this contrast.  Brita Romp, RN   Naproxen Hives, Itching and Rash    PAST MEDICAL HISTORY: Past Medical History:  Diagnosis Date   Arthritis    Atrial flutter [facility])    s/p RFA 2003   Breast cancer ([place])    1980's - lumpectomy only   Colon polyps    Diverticulosis    DVT, lower extremity ([place]) 1998 and 1999   Bilateral   Essential hypertension    Gallstones    GERD (gastroesophageal reflux disease)    Glaucoma    IBS (irritable bowel syndrome)    Mixed hyperlipidemia    Osteoporosis    Persistent atrial fibrillation ([place])    Pneumonia 2011   Sick sinus syndrome ([facility])    PPM ([facility])   Urinary tract  infection    Varicose veins     PAST SURGICAL HISTORY: Past Surgical History:  Procedure Laterality Date   BREAST LUMPECTOMY  1980's   Right breast    CARDIAC CATHETERIZATION  1999   Normal coronaries   Cataracts Bilateral    CHOLECYSTECTOMY     COLONOSCOPY W/ BIOPSIES AND POLYPECTOMY  03/2003, 05/2008, 02/20/2011   severe diverticulosis, tubulovillous adenoma polyp, internal and external hemorrhoids 2012: severe diverticulosis, 4 mm polyp, hemorrhoids    CYST REMOVAL TRUNK     under breast = on belly    DILATION AND CURETTAGE OF UTERUS     EYE SURGERY     cataracts - bilateral    Fractured wrist Left 2013   repair   INSERT / REPLACE / REMOVE PACEMAKER     KNEE ARTHROSCOPY WITH MEDIAL MENISECTOMY Right 10/21/2014   Procedure: RIGHT KNEE ARTHROSCOPY WITH MEDIAL MENISECTOM,lateral menisectomy,synovectomy suprapatellar pouch;  Surgeon: Latanya Maudlin, MD;  Location: [facility];  Service: Orthopedics;  Laterality: Right;   PACEMAKER GENERATOR CHANGE  06/18/12   [facility] DR RF, Dr Rayann Heman   PACEMAKER INSERTION  2003   PERMANENT PACEMAKER GENERATOR CHANGE N/A 06/18/2012   Procedure: PERMANENT PACEMAKER GENERATOR CHANGE;  Surgeon: Thompson Grayer, MD;  Location: [facility] C[facility] LAB;  Service: Cardiovascular;  Laterality: N/A;   TOTAL KNEE ARTHROPLASTY  12/20/2011   Procedure: TOTAL KNEE ARTHROPLASTY;  Surgeon: Tobi Bastos, MD;  Location: [facility];  Service: Orthopedics;  Laterality: Left;   TOTAL KNEE ARTHROPLASTY Right 02/10/2015   Procedure: TOTAL RIGHT KNEE ARTHROPLASTY;  Surgeon: Latanya Maudlin, MD;  Location: [facility];  Service: Orthopedics;  Laterality: Right;    MEDICATIONS AT HOME: Prior to Admission medications   Medication Sig Start Date End Date Taking? Authorizing Provider  acetaminophen (TYLENOL) 500 MG tablet Take 1,000 mg by mouth every 6 (six) hours as needed for mild pain.    [provider]  amiodarone (PACERONE) 200 MG tablet TAKE ONE (1) TABLET EACH DAY 05/31/21   Dettinger, Fransisca Kaufmann, MD  apixaban (ELIQUIS) 5 MG TABS tablet Take 1 tablet (5 mg total) by mouth 2 (two) times daily. 06/28/21 09/26/21  Baruch Gouty, FNP  calcium-vitamin D (OSCAL WITH D) 500-200 MG-UNIT tablet Take 1 tablet by mouth daily with breakfast.     [provider]  cyanocobalamin (,VITAMIN B-12,) 1000 MCG/ML injection Inject 1 mL (1,000 mcg total) into the muscle every 30 (thirty) days. 05/31/20   Dettinger, Fransisca Kaufmann, MD  furosemide (LASIX) 20 MG tablet  Take 1 tablet (20 mg total) by mouth daily. 06/28/21   Baruch Gouty, FNP  latanoprost (XALATAN) 0.005 % ophthalmic solution Place 1 drop into both eyes at bedtime.    [provider]  melatonin 5 MG TABS Take 5 mg by mouth at bedtime as needed.    [provider]  Multiple Vitamin (MULTIVITAMIN) tablet Take 1 tablet by mouth every morning.    [provider]  nadolol (CORGARD) 40 MG tablet Take 1.5 tablets (60 mg total) by mouth daily. 06/28/21 09/26/21  Baruch Gouty, FNP  nitroGLYCERIN (NITROSTAT) 0.4 MG SL tablet DISSOLVE 1 TAB UNDER TOUNGE FOR CHEST PAIN. MAY REPEAT EVERY 5 MINUTES FOR 3 DOSES. IF NO RELIEF CALL 911 OR GO TO ER 09/10/20   Satira Sark, MD  ondansetron (ZOFRAN-ODT) 4 MG disintegrating tablet TAKE 1 TABLET BY MOUTH EVERY 8 HOURS AS NEEDED FOR NAUSEA & VOMITING 03/08/21   Dettinger, Fransisca Kaufmann, MD  pantoprazole (PROTONIX) 40  MG tablet TAKE ONE (1) TABLET EACH DAY 05/31/21   Dettinger, Fransisca Kaufmann, MD  traMADol (ULTRAM) 50 MG tablet Take 50 mg by mouth every 6 (six) hours as needed. 02/21/21   [provider]  Wheat Dextrin (BENEFIBER) POWD Take 1 Dose by mouth daily.    [provider]    FAMILY HISTORY: Family History  Problem Relation Age of Onset   Throat cancer Father    Stroke Mother    Alzheimer's disease Mother    Alzheimer's disease Brother    Alzheimer's disease Sister    Colon cancer Maternal Grandfather    Stomach cancer Maternal Grandmother    Heart disease Paternal Grandmother    Heart attack Paternal Grandmother    Prostate cancer Son    Colon cancer Sister    Colon cancer Brother    Heart disease Brother    Atrial fibrillation Brother    Diabetes Brother    Dementia Brother    Parkinson's disease Brother    Prostate cancer Son       SOCIAL HISTORY:  reports that she has never smoked. She has never used smokeless tobacco. She reports that she does not drink alcohol and does not use drugs.  PHYSICAL  EXAM: Blood pressure (!) 148/71, pulse 60, temperature 97.7 F (36.5 C), resp. rate 14, height 5\' 3"  (1.6 m), weight 72.1 kg, SpO2 100 %.  General appearance :Awake, alert, not in any distress.  Eyes:, pupils equally reactive to light and accomodation,no scleral icterus.Pink conjunctiva HEENT: Atraumatic and Normocephalic Neck: supple, no JVD.  Resp:Good air entry bilaterally, few bibasilar rales CVS: S1 S2 regular, no murmurs.  GI: Bowel sounds present, Non tender and not distended with no gaurding, rigidity or rebound. Extremities: B/L Lower Ext shows 2++ edema, both legs are warm to touch Neurology:  speech clear,Non focal, sensation is grossly intact. Psychiatric: Normal judgment and insight. Alert and oriented x 3. Normal mood. Musculoskeletal:No digital cyanosis Skin:No Rash, warm and dry Wounds:N/A  LABS ON ADMISSION:  I have personally reviewed following labs and imaging studies  CBC: Recent Labs  Lab 06/28/21 1055 07/01/21 0909 07/02/21 1356  WBC 4.0 4.1 4.4  NEUTROABS 1.9 1.9 2.3  HGB 9.8* 8.6* 8.8*  HCT 29.9* 26.4* 28.8*  MCV 92 90 96.3  PLT 177 150 149*    Basic Metabolic Panel: Recent Labs  Lab 06/28/21 1055 07/02/21 1356  NA 138 138  K 4.7 4.3  CL 100 105  CO2 25 26  GLUCOSE 93 110*  BUN 16 21  CREATININE 0.92 0.93  CALCIUM 9.3 9.1    GFR: Estimated Creatinine Clearance: 38.3 mL/min (by C-G formula based on SCr of 0.93 mg/dL).  Liver Function Tests: Recent Labs  Lab 06/28/21 1055 07/02/21 1356  AST 25 32  ALT 23 18  ALKPHOS 70 56  BILITOT 0.5 1.6*  PROT 6.4 6.2*  ALBUMIN 4.2 3.6   No results for input(s): LIPASE, AMYLASE in the last 168 hours. No results for input(s): AMMONIA in the last 168 hours.  Coagulation Profile: No results for input(s): INR, PROTIME in the last 168 hours.  Cardiac Enzymes: No results for input(s): CKTOTAL, CKMB, CKMBINDEX, TROPONINI in the last 168 hours.  BNP (last 3 results) No results for input(s):  PROBNP in the last 8760 hours.  HbA1C: No results for input(s): HGBA1C in the last 72 hours.  CBG: No results for input(s): GLUCAP in the last 168 hours.  Lipid Profile: No results for input(s): CHOL, HDL, LDLCALC,  TRIG, CHOLHDL, LDLDIRECT in the last 72 hours.  Thyroid Function Tests: No results for input(s): TSH, T4TOTAL, FREET4, T3FREE, THYROIDAB in the last 72 hours.  Anemia Panel: Recent Labs    07/02/21 1524  VITAMINB12 542  FERRITIN 8*  TIBC 384  IRON 20*  RETICCTPCT 2.0    Urine analysis:    Component Value Date/Time   COLORURINE YELLOW 08/19/2015 1222   APPEARANCEUR CLEAR 08/19/2015 1222   LABSPEC 1.007 08/19/2015 1222   PHURINE 7.0 08/19/2015 1222   GLUCOSEU NEGATIVE 08/19/2015 1222   HGBUR NEGATIVE 08/19/2015 1222   BILIRUBINUR NEGATIVE 08/19/2015 1222   KETONESUR NEGATIVE 08/19/2015 1222   PROTEINUR NEGATIVE 08/19/2015 1222   UROBILINOGEN 0.2 02/03/2015 1518   NITRITE NEGATIVE 08/19/2015 1222   LEUKOCYTESUR NEGATIVE 08/19/2015 1222    Sepsis Labs: Lactic Acid, Venous No results found for: [place]   Microbiology: Recent Results (from the past 240 hour(s))  Resp Panel by RT-PCR (Flu A&B, Covid) Nasopharyngeal Swab     Status: None   Collection Time: 07/02/21  4:35 PM   Specimen: Nasopharyngeal Swab; Nasopharyngeal(NP) swabs in vial transport medium  Result Value Ref Range Status   SARS Coronavirus 2 by RT PCR NEGATIVE NEGATIVE Final    Comment: (NOTE) SARS-CoV-2 target nucleic acids are NOT DETECTED.  The SARS-CoV-2 RNA is generally detectable in upper respiratory specimens during the acute phase of infection. The lowest concentration of SARS-CoV-2 viral copies this assay can detect is 138 copies/mL. A negative result does not preclude SARS-Cov-2 infection and should not be used as the sole basis for treatment or other patient management decisions. A negative result may occur with  improper specimen collection/handling, submission of  specimen other than nasopharyngeal swab, presence of viral mutation(s) within the areas targeted by this assay, and inadequate number of viral copies(<138 copies/mL). A negative result must be combined with clinical observations, patient history, and epidemiological information. The expected result is Negative.  Fact Sheet for Patients:  EntrepreneurPulse.com.au  Fact Sheet for Healthcare Providers:  IncredibleEmployment.be  This test is no t yet approved or cleared by the [place] FDA and  has been authorized for detection and/or diagnosis of SARS-CoV-2 by FDA under an Emergency Use Authorization (EUA). This EUA will remain  in effect (meaning this test can be used) for the duration of the COVID-19 declaration under Section 564(b)(1) of the Act, 21 U.S.C.section 360bbb-3(b)(1), unless the authorization is terminated  or revoked sooner.       Influenza A by PCR NEGATIVE NEGATIVE Final   Influenza B by PCR NEGATIVE NEGATIVE Final    Comment: (NOTE) The Xpert Xpress SARS-CoV-2/FLU/RSV plus assay is intended as an aid in the diagnosis of influenza from Nasopharyngeal swab specimens and should not be used as a sole basis for treatment. Nasal washings and aspirates are unacceptable for Xpert Xpress SARS-CoV-2/FLU/RSV testing.  Fact Sheet for Patients: EntrepreneurPulse.com.au  Fact Sheet for Healthcare Providers: IncredibleEmployment.be  This test is not yet approved or cleared by the [place] FDA and has been authorized for detection and/or diagnosis of SARS-CoV-2 by FDA under an Emergency Use Authorization (EUA). This EUA will remain in effect (meaning this test can be used) for the duration of the COVID-19 declaration under Section 564(b)(1) of the Act, 21 U.S.C. section 360bbb-3(b)(1), unless the authorization is terminated or revoked.  Performed at [facility], [place] [address]  [place], [place] [address]       RADIOLOGIC STUDIES ON ADMISSION: DG Chest 2 View  Result Date:  07/02/2021 CLINICAL DATA:  Shortness of breath and fatigue. EXAM: CHEST - 2 VIEW COMPARISON:  03/08/2021 and prior radiographs FINDINGS: Cardiomegaly and RIGHT-sided pacemaker again noted. There is no evidence of focal airspace disease, pulmonary edema, suspicious pulmonary nodule/mass, pleural effusion, or pneumothorax. No acute bony abnormalities are identified. IMPRESSION: Cardiomegaly without evidence of acute cardiopulmonary disease. Electronically Signed   By: Margarette Canada M.D.   On: 07/02/2021 14:43    I have personally reviewed images of chest xray: Prominent interstitial markings but no obvious infiltrates   EKG:  Personally reviewed.  Paced rhythm  ASSESSMENT AND PLAN: * Acute on chronic diastolic (congestive) heart failure ([place])- (present on admission) Presenting with fatigue, exertional dyspnea and worsening leg swelling-also with dropping hemoglobin.  Suspect presenting symptoms as a combination of worsening anemia and decompensated diastolic heart failure.  Starting IV Lasix-follow weights, intake output, electrolytes and monitor closely.  We will obtain updated 2D echocardiogram.  Iron deficiency anemia- (present on admission) Denies any melanotic stools/bloody stools or vomiting blood.   She seems to have some had worsening anemia since June of last year.  Given drop in hemoglobin over the past few days-she may have had recent GI bleed that she may not have noticed.  However she is not actively bleeding as FOBT is negative.  Her shortness of breath/fatigue seems to be out of proportion to the degree of anemia-and as noted above-suspect it is more from CHF with some contribution from dropping hemoglobin.  Although I doubt she has a retroperitoneal hematoma-we will go ahead and order a CT pelvis without contrast to rule this out.  In the interim- start iron supplementation-we will consult  pharmacy to dose IV iron while she is in-house.  Will need to further discuss with patient/family-whether a GI evaluation is warranted.  However since she is not actively bleeding-this can be pursued in the outpatient setting if her symptoms improve with diuresis.  Follow CBC periodically.  Essential hypertension, benign- (present on admission) BP stable-continue nadolol.  Follow BP trend and adjust medications accordingly.  Paroxysmal atrial fibrillation ([place])- (present on admission) Paced rhythm on telemetry-continue amiodarone, nadolol-since she is not actively bleeding-suspect we can continue Eliquis cautiously.  Sick sinus syndrome ([place])- (present on admission) S/p PPM placement-monitor on telemetry.  VTE (venous thromboembolism)- (present on admission) Has a history of DVT several years ago-in both of her lower extremities-maintained on anticoagulation.  B12 deficiency- (present on admission) B12 levels appear appropriate-continue monthly supplementation in the outpatient setting.  GERD (gastroesophageal reflux disease)- (present on admission) Continue PPI    COVID/flu PCR-troponins--BNP levels are pending at the time of this dictation.  Further plan will depend as patient's clinical course evolves and further radiologic and laboratory data become available. Patient will be monitored closely.  Above noted plan was discussed with patient/son face to face at bedside, they were in agreement.   CONSULTS: None   DVT Prophylaxis: Eliquis  Code Status: Full Code  Disposition Plan:  Discharge back home v possibly in 2-3 days, depending on clinical course  Admission status:  Inpatient going to tele  The medical decision making on this patient was of high complexity and the patient is at high risk for clinical deterioration, therefore this is a level 3 visit.    Total time spent  55 minutes.Greater than 50% of this time was spent in counseling, explanation of diagnosis,  planning of further management, and coordination of care.  Severity of illness: The appropriate patient status for this patient is INPATIENT.  Inpatient status is judged to be reasonable and necessary in order to provide the required intensity of service to ensure the patient's safety. The patient's presenting symptoms, physical exam findings, and initial radiographic and laboratory data in the context of their chronic comorbidities is felt to place them at high risk for further clinical deterioration. Furthermore, it is not anticipated that the patient will be medically stable for discharge from the hospital within 2 midnights of admission. The following factors support the patient status of inpatient.   " The patient's presenting symptoms include shortness of breath/fatigue/worsening leg edema " The worrisome physical exam findings include bibasilar rales/2+ pitting edema. " The initial radiographic and laboratory data are worrisome because of chest x-ray with central venous congestion. " The chronic co-morbidities include advanced age, diastolic heart failure, A. fib, VT.   * I certify that at the point of admission it is my clinical judgment that the patient will require inpatient hospital care spanning beyond 2 midnights from the point of admission due to high intensity of service, high risk for further deterioration and high frequency of surveillance required.  Oren Binet [facility] Pager [phone]  If 7PM-7AM, please contact night-coverage  Please page via [email or web address]  Go to [email or web address] and use [place]'s universal password to access. If you do not have the password, please contact the hospital operator.  Locate the [facility] provider you are looking for under [facility] and page to a number that you can be directly reached. If you still have difficulty reaching the provider, please page the [facility] (Director on Call) for the Hospitalists listed on amion for  assistance.  07/02/2021, 5:16 PM

## 2021-07-02 NOTE — Assessment & Plan Note (Signed)
Continue PPI ?

## 2021-07-02 NOTE — Assessment & Plan Note (Addendum)
S/p PPM placement

## 2021-07-02 NOTE — Assessment & Plan Note (Addendum)
Volume status much better-now euvolemic-have transitioned to oral furosemide.  Echo with preserved EF (EF 60 to 65%).  Follow weights/electrolytes periodically.  I have counseled regarding importance of fluid restriction.  Her presenting complaints of fatigue/shortness of breath was mostly from CHF-as she has improved remarkably with diuresis.  Some component of fatigue/shortness of breath may be from iron deficiency anemia as well.

## 2021-07-02 NOTE — Assessment & Plan Note (Addendum)
Suspicion for intermittent recent GI bleeding while on Eliquis.  FOBT was negative.  Given IV iron x2 during this hospitalization-hemoglobin now slowly improving.  EGD without any bleeding source.  Colonoscopy contemplated-but family/patient elected to hold off for now.  Plan is to restart/challenge with resumption of anticoagulation with Eliquis (see discussion below) and follow clinically to see if patient has overt bleeding or if hemoglobin decreases.  Per patient/family-colonoscopy can be considered if GI bleeding occurs/hemoglobin continues to drop.

## 2021-07-02 NOTE — ED Notes (Signed)
Meal tray ordered for pt  

## 2021-07-02 NOTE — ED Triage Notes (Signed)
Patient arrives with complaints of abnormal labs. Patient had labs drawn with her primary earlier this week and her hgb was 9.8. Repeat labs showed that her HGB had decreased to 8.6. Patient was instructed to hold her eliquis ( for. Afib) and come to the ED for a possible blood transfusion.   Patient reports increased fatigue and shortness of breath for ~1 week. Also reports increased leg swelling, currently takes lasix.

## 2021-07-02 NOTE — Assessment & Plan Note (Addendum)
Paced rhythm on telemetry-on amiodarone and nadolol.  Eliquis held on admission.  Plans were to complete GI work-up with EGD/colonoscopy before resuming Eliquis.  However family/patient wants to hold off on colonoscopy.  Long discussion with patient-and subsequently with her son [name] over the phone-different options discussed including permanently stopping anticoagulation versus resuming Eliquis-and following CBC/clinically to see if bleeding occurs.  Both patient/family elected to continue Eliquis on discharge-all aware of the need to monitor for GI bleeding and to seek immediate hospitalization if this were to happen.  Family would want to pursue colonoscopy if in the event GI bleeding occurs or if hemoglobin continues to drop.

## 2021-07-02 NOTE — Assessment & Plan Note (Addendum)
Has a history of DVT several years ago-in both of her lower extremities-see above regarding anticoagulation.

## 2021-07-02 NOTE — Assessment & Plan Note (Signed)
B12 levels appear appropriate-continue monthly supplementation in the outpatient setting.

## 2021-07-02 NOTE — ED Provider Notes (Signed)
[facility] EMERGENCY DEPARTMENT Provider Note   CSN: [id number] Arrival date & time: 07/02/21  1315     History  Chief Complaint  Patient presents with   Abnormal Lab   Shortness of Breath   Fatigue    [name] is a 88 y.o. female.  [name] is a 88 y.o. female with a history of A. fib on anticoagulation, hypertension, hyperlipidemia, GERD, IBS, diverticulosis, who presents to the emergency department for evaluation of low hemoglobin.  Patient reports over the past few weeks she has been feeling progressively more fatigued and has had some increased shortness of breath.  Her doctor checked her blood counts and her hemoglobin had dropped from a baseline around 11 to 9.2, they rechecked the lab work yesterday and hemoglobin had fallen further to 8.6.  She was instructed to hold her morning dose of Eliquis today and present to the ED for possible blood transfusion.  She denies any prior history of bleeding or low hemoglobin.  No bright red blood or melena noted in her stool and no prior history of GI bleeding.  No recent trauma or injury.  Did have a fall several months ago but had normal hemoglobins at that time.   Abnormal Lab Shortness of Breath Associated symptoms: no abdominal pain, no chest pain, no cough, no fever and no vomiting       Home Medications Prior to Admission medications   Medication Sig Start Date End Date Taking? Authorizing Provider  acetaminophen (TYLENOL) 500 MG tablet Take 1,000 mg by mouth every 6 (six) hours as needed for mild pain.    [provider]  amiodarone (PACERONE) 200 MG tablet TAKE ONE (1) TABLET EACH DAY 05/31/21   Dettinger, Fransisca Kaufmann, MD  apixaban (ELIQUIS) 5 MG TABS tablet Take 1 tablet (5 mg total) by mouth 2 (two) times daily. 06/28/21 09/26/21  Baruch Gouty, FNP  calcium-vitamin D (OSCAL WITH D) 500-200 MG-UNIT tablet Take 1 tablet by mouth daily with breakfast.     [provider]   cyanocobalamin (,VITAMIN B-12,) 1000 MCG/ML injection Inject 1 mL (1,000 mcg total) into the muscle every 30 (thirty) days. 05/31/20   Dettinger, Fransisca Kaufmann, MD  furosemide (LASIX) 20 MG tablet Take 1 tablet (20 mg total) by mouth daily. 06/28/21   Baruch Gouty, FNP  latanoprost (XALATAN) 0.005 % ophthalmic solution Place 1 drop into both eyes at bedtime.    [provider]  melatonin 5 MG TABS Take 5 mg by mouth at bedtime as needed.    [provider]  Multiple Vitamin (MULTIVITAMIN) tablet Take 1 tablet by mouth every morning.    [provider]  nadolol (CORGARD) 40 MG tablet Take 1.5 tablets (60 mg total) by mouth daily. 06/28/21 09/26/21  Baruch Gouty, FNP  nitroGLYCERIN (NITROSTAT) 0.4 MG SL tablet DISSOLVE 1 TAB UNDER TOUNGE FOR CHEST PAIN. MAY REPEAT EVERY 5 MINUTES FOR 3 DOSES. IF NO RELIEF CALL 911 OR GO TO ER 09/10/20   Satira Sark, MD  ondansetron (ZOFRAN-ODT) 4 MG disintegrating tablet TAKE 1 TABLET BY MOUTH EVERY 8 HOURS AS NEEDED FOR NAUSEA & VOMITING 03/08/21   Dettinger, Fransisca Kaufmann, MD  pantoprazole (PROTONIX) 40 MG tablet TAKE ONE (1) TABLET EACH DAY 05/31/21   Dettinger, Fransisca Kaufmann, MD  traMADol (ULTRAM) 50 MG tablet Take 50 mg by mouth every 6 (six) hours as needed. 02/21/21   [provider]  Wheat Dextrin (BENEFIBER) POWD Take 1  Dose by mouth daily.    [provider]      Allergies    Iodinated contrast media and Naproxen    Review of Systems   Review of Systems  Constitutional:  Positive for fatigue. Negative for chills and fever.  HENT: Negative.    Respiratory:  Positive for shortness of breath. Negative for cough.   Cardiovascular:  Negative for chest pain.  Gastrointestinal:  Negative for abdominal pain, blood in stool, constipation, diarrhea, nausea and vomiting.  Genitourinary:  Negative for dysuria.  Neurological:  Positive for weakness (Generalized). Negative for dizziness and light-headedness.  All other systems  reviewed and are negative.  Physical Exam Updated Vital Signs BP (!) 143/77    Pulse (!) 59    Temp 97.7 F (36.5 C)    Resp 17    Ht 5\' 3"  (1.6 m)    Wt 72.1 kg    SpO2 99%    BMI 28.17 kg/m  Physical Exam Vitals and nursing note reviewed.  Constitutional:      General: She is not in acute distress.    Appearance: Normal appearance. She is well-developed. She is not diaphoretic.     Comments: Elderly female, alert, no acute distress  HENT:     Head: Normocephalic and atraumatic.  Eyes:     General:        Right eye: No discharge.        Left eye: No discharge.     Pupils: Pupils are equal, round, and reactive to light.  Cardiovascular:     Rate and Rhythm: Normal rate and regular rhythm.     Pulses: Normal pulses.     Heart sounds: Normal heart sounds.  Pulmonary:     Effort: Pulmonary effort is normal. No respiratory distress.     Breath sounds: Normal breath sounds. No wheezing or rales.     Comments: Respirations equal and unlabored, patient able to speak in full sentences, lungs clear to auscultation bilaterally  Chest:     Chest wall: No tenderness.  Abdominal:     General: Bowel sounds are normal. There is no distension.     Palpations: Abdomen is soft. There is no mass.     Tenderness: There is no abdominal tenderness. There is no guarding.     Comments: Abdomen soft, nondistended, nontender to palpation in all quadrants without guarding or peritoneal signs  Genitourinary:    Comments: Chaperone present during rectal exam.  No gross blood or melena present, normal rectal tone Musculoskeletal:        General: No deformity.     Cervical back: Neck supple.     Right lower leg: No edema.     Left lower leg: No edema.  Skin:    General: Skin is warm and dry.     Capillary Refill: Capillary refill takes less than 2 seconds.  Neurological:     Mental Status: She is alert and oriented to person, place, and time.     Coordination: Coordination normal.     Comments:  Speech is clear, able to follow commands Moves extremities without ataxia, coordination intact  Psychiatric:        Mood and Affect: Mood normal.        Behavior: Behavior normal.    ED Results / Procedures / Treatments   Labs (all labs ordered are listed, but only abnormal results are displayed) Labs Reviewed  CBC WITH DIFFERENTIAL/PLATELET - Abnormal; Notable for the following components:  Result Value   RBC 2.99 (*)    Hemoglobin 8.8 (*)    HCT 28.8 (*)    Platelets 149 (*)    All other components within normal limits  COMPREHENSIVE METABOLIC PANEL - Abnormal; Notable for the following components:   Glucose, Bld 110 (*)    Total Protein 6.2 (*)    Total Bilirubin 1.6 (*)    GFR, Estimated 58 (*)    All other components within normal limits  RETICULOCYTES - Abnormal; Notable for the following components:   RBC. 2.17 (*)    Immature Retic Fract 25.3 (*)    All other components within normal limits  RESP PANEL BY RT-PCR (FLU A&B, COVID) ARPGX2  VITAMIN B12  FOLATE  IRON AND TIBC  FERRITIN  POC OCCULT BLOOD, ED  TYPE AND SCREEN    EKG EKG Interpretation  Date/Time:  Saturday July 02 2021 14:03:34 EST Ventricular Rate:  61 PR Interval:  194 QRS Duration: 212 QT Interval:  524 QTC Calculation: 527 R Axis:   -72 Text Interpretation: AV dual-paced rhythm Abnormal ECG When compared with ECG of 17-Nov-2020 16:16, PREVIOUS ECG IS PRESENT Confirmed by Malvin Johns [phone]) on 07/02/2021 4:11:37 PM  Radiology CT ABDOMEN PELVIS WO CONTRAST  Result Date: 07/02/2021 CLINICAL DATA:  Abdominal pain, evaluate for retroperitoneal hematoma EXAM: CT ABDOMEN AND PELVIS WITHOUT CONTRAST TECHNIQUE: Multidetector CT imaging of the abdomen and pelvis was performed following the standard protocol without IV contrast. RADIATION DOSE REDUCTION: This exam was performed according to the departmental dose-optimization program which includes automated exposure control, adjustment of the  mA and/or kV according to patient size and/or use of iterative reconstruction technique. COMPARISON:  None. FINDINGS: Lower chest: Heart is enlarged in size. Pacer leads are noted. There are coarse calcifications in the mitral annulus. Hepatobiliary: No focal abnormality is seen in the liver. There is mild nodularity in the liver surface. Surgical clips are seen in gallbladder fossa. Pancreas: No focal abnormality is seen. Spleen: Unremarkable. Adrenals/Urinary Tract: Adrenals are unremarkable. There is no hydronephrosis. There are no renal or ureteral stones. Urinary bladder is not distended. There are few low-density lesions in the renal cortex on both sides largest measuring 2.6 cm in diameter in the lower pole of right kidney suggesting renal cysts. Stomach/Bowel: Small hiatal hernia is seen. Stomach is not distended. Small bowel loops are not dilated. Appendix is not dilated. There is no significant wall thickening in colon. There is no pericolic stranding. Scattered diverticula are seen without signs of focal acute diverticulitis. Vascular/Lymphatic: There are small scattered arterial calcifications. There is no evidence of retroperitoneal hematoma. There scattered subcentimeter nodes in mesentery and retroperitoneum. Reproductive: Uterus is unremarkable. In image 60 of series 3, there is small area of fluid density in the right posterior pelvis which could not be definitely separated from a bowel loop suggesting possible fluid in the lumen of a bowel loop. There are no dominant adnexal masses. Other: There is no pneumoperitoneum. Small right inguinal hernia containing fat is seen. Musculoskeletal: Unremarkable. IMPRESSION: There is no evidence of intestinal obstruction or pneumoperitoneum. Appendix is not dilated. There is no hydronephrosis. There is no demonstrable retroperitoneal hematoma. Scattered diverticula are seen in colon without signs of focal acute diverticulitis. There few smooth marginated  low-density lesions in the kidneys suggesting renal cysts. There is mild nodularity in the liver surface which may be normal variation or suggest cirrhosis. Other findings as described in the body of the report. Electronically Signed   By: Royston Cowper  Rathinasamy M.D.   On: 07/02/2021 17:24   DG Chest 2 View  Result Date: 07/02/2021 CLINICAL DATA:  Shortness of breath and fatigue. EXAM: CHEST - 2 VIEW COMPARISON:  03/08/2021 and prior radiographs FINDINGS: Cardiomegaly and RIGHT-sided pacemaker again noted. There is no evidence of focal airspace disease, pulmonary edema, suspicious pulmonary nodule/mass, pleural effusion, or pneumothorax. No acute bony abnormalities are identified. IMPRESSION: Cardiomegaly without evidence of acute cardiopulmonary disease. Electronically Signed   By: Margarette Canada M.D.   On: 07/02/2021 14:43    Procedures Procedures    Medications Ordered in ED Medications - No data to display  ED Course/ Medical Decision Making/ A&P                           Medical Decision Making  SIMONE RODENBECK is a 88 y.o. female presents to the ED for concern of low hemoglobin, this involves an extensive number of treatment options, and is a complaint that carries with it a high risk of complications and morbidity.  The differential diagnosis includes symptomatic anemia, GI bleed, occult bleeding from Eliquis, hematoma   Additional history obtained:  Additional history obtained from patient's son at bedside External records from outside source obtained and reviewed including records from recent PCP and cardiology visits   Lab Tests:  I Ordered, reviewed, and interpreted labs.  The pertinent results include: Hemoglobin of 8.8 normocytic anemia, stable compared to labs collected yesterday but still a drop from her baseline hemoglobin of 11, no significant electrolyte derangements, will collect type and screen and send anemia panel.  Hemoccult negative   Imaging Studies  ordered:  I ordered imaging studies including chest x-ray I independently visualized and interpreted imaging which showed cardiomegaly with no other active cardiopulmonary disease I agree with the radiologist interpretation   Cardiac Monitoring:  The patient was maintained on a cardiac monitor.  I personally viewed and interpreted the cardiac monitored which showed an underlying rhythm of: AV dual paced rhythym with HR in the 60s  Medicines ordered and prescription drug management:  No medications ordered at this time, will hold off on blood transfusion given hemoglobin is stable I have reviewed the patients home medicines and have made adjustments as needed, will continue to    ED Course:  Patient here with downtrending hemoglobin this week with outpatient labs, symptomatic with fatigue, and shortness of breath Negative Hemoccult here, hemoglobin is stable but still significantly lower than baseline without clear cause. Given the patient is on anticoagulation.  It is reasonable to admit for observation and trending hemoglobin, if it continues to downtrend may need blood transfusion   Consultations Obtained:  I requested consultation with the service,  and discussed lab and imaging findings as well as pertinent plan with Dr. Sloan Leiter, he request CT abdomen pelvis to rule out retroperitoneal hemorrhage, and will see the patient for admission    Dispostion:  After consideration of the diagnostic results and the patients response to treatment feel that the patent would benefit from admission.          Final Clinical Impression(s) / ED Diagnoses Final diagnoses:  Symptomatic anemia    Rx / DC Orders ED Discharge Orders     None         Jacqlyn Larsen, [place] 07/02/21 1744    Malvin Johns, MD 07/02/21 [phone]

## 2021-07-02 NOTE — ED Notes (Signed)
Pt assisted to [facility]. Tolerated well.

## 2021-07-02 NOTE — Progress Notes (Signed)
[facility] for IV iron Indication: Fe anemia  Allergies  Allergen Reactions   Iodinated Contrast Media Hives, Itching and Rash    03/13/14: symptoms began within two hours of intrathecal injection (myelogram 03/11/14); on shoulders radiating down to stomach.  Relief with Benadryl.  Told patient she would need to pre-med with Benadryl in the future before receiving this contrast.  Brita Romp, RN   Naproxen Hives, Itching and Rash    Patient Measurements: Height: 5\' 3"  (160 cm) Weight: 72.1 kg (159 lb) IBW/kg (Calculated) : 52.4  Vital Signs: Temp: 97.7 F (36.5 C) (01/21 1335) BP: 131/66 (01/21 1800) Pulse Rate: 62 (01/21 1800) Intake/Output from previous day: No intake/output data recorded. Intake/Output from this shift: Total I/O In: -  Out: 450 [Urine:450]  Labs: Recent Labs    07/01/21 0909 07/02/21 1356  WBC 4.1 4.4  HGB 8.6* 8.8*  HCT 26.4* 28.8*  PLT 150 149*  CREATININE  --  0.93  ALBUMIN  --  3.6  PROT  --  6.2*  AST  --  32  ALT  --  18  ALKPHOS  --  56  BILITOT  --  1.6*   Estimated Creatinine Clearance: 38.3 mL/min (by C-G formula based on SCr of 0.93 mg/dL).   Microbiology: Recent Results (from the past 720 hour(s))  Resp Panel by RT-PCR (Flu A&B, Covid) Nasopharyngeal Swab     Status: None   Collection Time: 07/02/21  4:35 PM   Specimen: Nasopharyngeal Swab; Nasopharyngeal(NP) swabs in vial transport medium  Result Value Ref Range Status   SARS Coronavirus 2 by RT PCR NEGATIVE NEGATIVE Final    Comment: (NOTE) SARS-CoV-2 target nucleic acids are NOT DETECTED.  The SARS-CoV-2 RNA is generally detectable in upper respiratory specimens during the acute phase of infection. The lowest concentration of SARS-CoV-2 viral copies this assay can detect is 138 copies/mL. A negative result does not preclude SARS-Cov-2 infection and should not be used as the sole basis for treatment or other patient management  decisions. A negative result may occur with  improper specimen collection/handling, submission of specimen other than nasopharyngeal swab, presence of viral mutation(s) within the areas targeted by this assay, and inadequate number of viral copies(<138 copies/mL). A negative result must be combined with clinical observations, patient history, and epidemiological information. The expected result is Negative.  Fact Sheet for Patients:  EntrepreneurPulse.com.au  Fact Sheet for Healthcare Providers:  IncredibleEmployment.be  This test is no t yet approved or cleared by the [place] FDA and  has been authorized for detection and/or diagnosis of SARS-CoV-2 by FDA under an Emergency Use Authorization (EUA). This EUA will remain  in effect (meaning this test can be used) for the duration of the COVID-19 declaration under Section 564(b)(1) of the Act, 21 U.S.C.section 360bbb-3(b)(1), unless the authorization is terminated  or revoked sooner.       Influenza A by PCR NEGATIVE NEGATIVE Final   Influenza B by PCR NEGATIVE NEGATIVE Final    Comment: (NOTE) The Xpert Xpress SARS-CoV-2/FLU/RSV plus assay is intended as an aid in the diagnosis of influenza from Nasopharyngeal swab specimens and should not be used as a sole basis for treatment. Nasal washings and aspirates are unacceptable for Xpert Xpress SARS-CoV-2/FLU/RSV testing.  Fact Sheet for Patients: EntrepreneurPulse.com.au  Fact Sheet for Healthcare Providers: IncredibleEmployment.be  This test is not yet approved or cleared by the [place] FDA and has been authorized for detection and/or diagnosis of SARS-CoV-2  by FDA under an Emergency Use Authorization (EUA). This EUA will remain in effect (meaning this test can be used) for the duration of the COVID-19 declaration under Section 564(b)(1) of the Act, 21 U.S.C. section 360bbb-3(b)(1), unless the  authorization is terminated or revoked.  Performed at [facility], [place] [address] [place], [place] [address]     Assessment: 69 YOF presenting with fatigue, Fe anemia with hemoglobin 8.8.  IBW 52.4kg, target Hgb 14.8, likely deficit >1g.    Plan:  Multi-dose for repletion  Will give ferric gluconate 125mg  IV x 1, if tolerates can try 250mg  for next dose  Monitor response/toleration, Hgb, clinical progression  Bertis Ruddy 07/02/2021,6:44 PM

## 2021-07-02 NOTE — ED Notes (Signed)
Pt ambulated to BR with steady gait and X standby assist. Tolerated well.

## 2021-07-03 ENCOUNTER — Inpatient Hospital Stay (HOSPITAL_COMMUNITY): Payer: Medicare Other

## 2021-07-03 DIAGNOSIS — I480 Paroxysmal atrial fibrillation: Secondary | ICD-10-CM

## 2021-07-03 DIAGNOSIS — I5031 Acute diastolic (congestive) heart failure: Secondary | ICD-10-CM

## 2021-07-03 LAB — CUP PACEART REMOTE DEVICE CHECK
Battery Remaining Longevity: 1 mo
Battery Remaining Percentage: 0.5 %
Battery Voltage: 2.62 V
Brady Statistic AP VP Percent: 99 %
Brady Statistic AP VS Percent: 1 %
Brady Statistic AS VP Percent: 1 %
Brady Statistic AS VS Percent: 1 %
Brady Statistic RA Percent Paced: 99 %
Brady Statistic RV Percent Paced: 99 %
Date Time Interrogation Session: 20230121020008
Implantable Lead Implant Date: 20031028
Implantable Lead Implant Date: 20031028
Implantable Lead Location: 753859
Implantable Lead Location: 753860
Implantable Pulse Generator Implant Date: 20140107
Lead Channel Impedance Value: 330 Ohm
Lead Channel Impedance Value: 410 Ohm
Lead Channel Pacing Threshold Amplitude: 0.5 V
Lead Channel Pacing Threshold Amplitude: 0.75 V
Lead Channel Pacing Threshold Pulse Width: 0.5 ms
Lead Channel Pacing Threshold Pulse Width: 0.5 ms
Lead Channel Sensing Intrinsic Amplitude: 0.2 mV
Lead Channel Sensing Intrinsic Amplitude: 9.6 mV
Lead Channel Setting Pacing Amplitude: 2 V
Lead Channel Setting Pacing Amplitude: 2.5 V
Lead Channel Setting Pacing Pulse Width: 0.5 ms
Lead Channel Setting Sensing Sensitivity: 2 mV
Pulse Gen Model: 2210
Pulse Gen Serial Number: 7438567

## 2021-07-03 LAB — COMPREHENSIVE METABOLIC PANEL
ALT: 18 U/L (ref 0–44)
AST: 20 U/L (ref 15–41)
Albumin: 3.2 g/dL — ABNORMAL LOW (ref 3.5–5.0)
Alkaline Phosphatase: 50 U/L (ref 38–126)
Anion gap: 8 (ref 5–15)
BUN: 19 mg/dL (ref 8–23)
CO2: 28 mmol/L (ref 22–32)
Calcium: 8.8 mg/dL — ABNORMAL LOW (ref 8.9–10.3)
Chloride: 103 mmol/L (ref 98–111)
Creatinine, Ser: 0.87 mg/dL (ref 0.44–1.00)
GFR, Estimated: >60 mL/min (ref >60)
Glucose, Bld: 108 mg/dL — ABNORMAL HIGH (ref 70–99)
Potassium: 3.9 mmol/L (ref 3.5–5.1)
Sodium: 139 mmol/L (ref 135–145)
Total Bilirubin: 0.6 mg/dL (ref 0.3–1.2)
Total Protein: 5.6 g/dL — ABNORMAL LOW (ref 6.5–8.1)

## 2021-07-03 LAB — CBC
HCT: 25.3 % — ABNORMAL LOW (ref 36.0–46.0)
Hemoglobin: 8 g/dL — ABNORMAL LOW (ref 12.0–15.0)
MCH: 29.4 pg (ref 26.0–34.0)
MCHC: 31.6 g/dL (ref 30.0–36.0)
MCV: 93 fL (ref 80.0–100.0)
Platelets: 128 10*3/uL — ABNORMAL LOW (ref 150–400)
RBC: 2.72 MIL/uL — ABNORMAL LOW (ref 3.87–5.11)
RDW: 14.6 % (ref 11.5–15.5)
WBC: 4 10*3/uL (ref 4.0–10.5)
nRBC: 0 % (ref 0.0–0.2)

## 2021-07-03 LAB — ECHOCARDIOGRAM COMPLETE
AR max vel: 1.29 cm2
AV Area VTI: 1.24 cm2
AV Area mean vel: 1.17 cm2
AV Mean grad: 12 mmHg
AV Peak grad: 22.8 mmHg
Ao pk vel: 2.39 m/s
Area-P 1/2: 5.02 cm2
Height: 63 in
MV M vel: 3.88 m/s
MV Peak grad: 60.2 mmHg
Radius: 0.4 cm
S' Lateral: 2.9 cm
Weight: 2524.8 oz

## 2021-07-03 MED ORDER — FUROSEMIDE 10 MG/ML IJ SOLN
40.0000 mg | Freq: Every day | INTRAMUSCULAR | Status: DC
  Filled 2021-07-03: qty 4

## 2021-07-03 NOTE — H&P (View-Only) (Signed)
Reason for Consult: Symptomatic anemia Referring Physician: Ackley HPI: This is a 88 year old female with a PMH of SSS s/p pacemaker, PAF on Eliquis, and HFpEF admitted for progressive fatigue, DOE,  and lower extremity edema.  On 06/28/2021 she was evaluated by her PCP for these complaints and her HGB was noted to be at 9.8 g/dL and previously her baseline was around 11-12 g/dL.  Her last HGB in Epic was on 11/18/2020 at 11.5 g/dL.  With IV hydration her current HGB is at 8.0 g/dL with an MCV of 93.  Her iron saturation was at 5 and her ferritin was at 8.  The patient does not report any problems with hematemesis, hematochezia, hemoptysis, or hematuria.  Heme testing was negative for this admission, but she reported having "dark stools".  She had an EGD with Dr. Carlean Purl on 09/13/2015 for epigastric pain complaints and it was a normal examination.  Prior to that time her last colonoscopy was performed on 02/20/2011, and it was essentially normal.  Past Medical History:  Diagnosis Date   Arthritis    Atrial flutter ([place])    s/p RFA 2003   Breast cancer ([place])    1980's - lumpectomy only   Colon polyps    Diverticulosis    DVT, lower extremity ([place]) 1998 and 1999   Bilateral   Essential hypertension    Gallstones    GERD (gastroesophageal reflux disease)    Glaucoma    IBS (irritable bowel syndrome)    Mixed hyperlipidemia    Osteoporosis    Persistent atrial fibrillation ([place])    Pneumonia 2011   Sick sinus syndrome ([facility])    PPM ([facility])   Urinary tract infection    Varicose veins     Past Surgical History:  Procedure Laterality Date   BREAST LUMPECTOMY  1980's   Right breast    CARDIAC CATHETERIZATION  1999   Normal coronaries   Cataracts Bilateral    CHOLECYSTECTOMY     COLONOSCOPY W/ BIOPSIES AND POLYPECTOMY  03/2003, 05/2008, 02/20/2011   severe diverticulosis, tubulovillous adenoma polyp, internal and external hemorrhoids 2012: severe diverticulosis,  4 mm polyp, hemorrhoids   CYST REMOVAL TRUNK     under breast = on belly    DILATION AND CURETTAGE OF UTERUS     EYE SURGERY     cataracts - bilateral    Fractured wrist Left 2013   repair   INSERT / REPLACE / REMOVE PACEMAKER     KNEE ARTHROSCOPY WITH MEDIAL MENISECTOMY Right 10/21/2014   Procedure: RIGHT KNEE ARTHROSCOPY WITH MEDIAL MENISECTOM,lateral menisectomy,synovectomy suprapatellar pouch;  Surgeon: Latanya Maudlin, MD;  Location: [facility];  Service: Orthopedics;  Laterality: Right;   PACEMAKER GENERATOR CHANGE  06/18/12   [facility] DR RF, Dr Rayann Heman   PACEMAKER INSERTION  2003   PERMANENT PACEMAKER GENERATOR CHANGE N/A 06/18/2012   Procedure: PERMANENT PACEMAKER GENERATOR CHANGE;  Surgeon: Thompson Grayer, MD;  Location: [facility] CATH LAB;  Service: Cardiovascular;  Laterality: N/A;   TOTAL KNEE ARTHROPLASTY  12/20/2011   Procedure: TOTAL KNEE ARTHROPLASTY;  Surgeon: Tobi Bastos, MD;  Location: [facility];  Service: Orthopedics;  Laterality: Left;   TOTAL KNEE ARTHROPLASTY Right 02/10/2015   Procedure: TOTAL RIGHT KNEE ARTHROPLASTY;  Surgeon: Latanya Maudlin, MD;  Location: [facility];  Service: Orthopedics;  Laterality: Right;    Family History  Problem Relation Age of Onset   Throat cancer Father    Stroke Mother    Alzheimer's  disease Mother    Alzheimer's disease Brother    Alzheimer's disease Sister    Colon cancer Maternal Grandfather    Stomach cancer Maternal Grandmother    Heart disease Paternal Grandmother    Heart attack Paternal Grandmother    Prostate cancer Son    Colon cancer Sister    Colon cancer Brother    Heart disease Brother    Atrial fibrillation Brother    Diabetes Brother    Dementia Brother    Parkinson's disease Brother    Prostate cancer Son     Social History:  reports that she has never smoked. She has never used smokeless tobacco. She reports that she does not drink alcohol and does not use drugs.  Allergies:  Allergies  Allergen Reactions   Iodinated  Contrast Media Hives, Itching and Rash    03/13/14: symptoms began within two hours of intrathecal injection (myelogram 03/11/14); on shoulders radiating down to stomach.  Relief with Benadryl.  Told patient she would need to pre-med with Benadryl in the future before receiving this contrast.  Brita Romp, RN   Naproxen Hives, Itching and Rash    Medications: Scheduled:  amiodarone  200 mg Oral Daily   calcium-vitamin D  1 tablet Oral Q breakfast   furosemide  40 mg Intravenous Q12H   latanoprost  1 drop Both Eyes QHS   multivitamin with minerals  1 tablet Oral q morning   nadolol  60 mg Oral Daily   pantoprazole  40 mg Oral BID   Continuous:  Results for orders placed or performed during the hospital encounter of 07/02/21 (from the past 24 hour(s))  CBC with Differential     Status: Abnormal   Collection Time: 07/02/21  1:56 PM  Result Value Ref Range   WBC 4.4 4.0 - 10.5 K/uL   RBC 2.99 (L) 3.87 - 5.11 MIL/uL   Hemoglobin 8.8 (L) 12.0 - 15.0 g/dL   HCT 28.8 (L) 36.0 - 46.0 %   MCV 96.3 80.0 - 100.0 fL   MCH 29.4 26.0 - 34.0 pg   MCHC 30.6 30.0 - 36.0 g/dL   RDW 14.6 11.5 - 15.5 %   Platelets 149 (L) 150 - 400 K/uL   nRBC 0.0 0.0 - 0.2 %   Neutrophils Relative % 51 %   Neutro Abs 2.3 1.7 - 7.7 K/uL   Lymphocytes Relative 30 %   Lymphs Abs 1.3 0.7 - 4.0 K/uL   Monocytes Relative 14 %   Monocytes Absolute 0.6 0.1 - 1.0 K/uL   Eosinophils Relative 4 %   Eosinophils Absolute 0.2 0.0 - 0.5 K/uL   Basophils Relative 1 %   Basophils Absolute 0.0 0.0 - 0.1 K/uL   Immature Granulocytes 0 %   Abs Immature Granulocytes 0.01 0.00 - 0.07 K/uL  Comprehensive metabolic panel     Status: Abnormal   Collection Time: 07/02/21  1:56 PM  Result Value Ref Range   Sodium 138 135 - 145 mmol/L   Potassium 4.3 3.5 - 5.1 mmol/L   Chloride 105 98 - 111 mmol/L   CO2 26 22 - 32 mmol/L   Glucose, Bld 110 (H) 70 - 99 mg/dL   BUN 21 8 - 23 mg/dL   Creatinine, Ser 0.93 0.44 - 1.00 mg/dL   Calcium  9.1 8.9 - 10.3 mg/dL   Total Protein 6.2 (L) 6.5 - 8.1 g/dL   Albumin 3.6 3.5 - 5.0 g/dL   AST 32 15 - 41 U/L  ALT 18 0 - 44 U/L   Alkaline Phosphatase 56 38 - 126 U/L   Total Bilirubin 1.6 (H) 0.3 - 1.2 mg/dL   GFR, Estimated 58 (L) >60 mL/min   Anion gap 7 5 - 15  POC occult blood, ED Provider will collect     Status: None   Collection Time: 07/02/21  3:18 PM  Result Value Ref Range   Fecal Occult Bld NEGATIVE NEGATIVE  Vitamin B12     Status: None   Collection Time: 07/02/21  3:24 PM  Result Value Ref Range   Vitamin B-12 542 180 - 914 pg/mL  Folate     Status: None   Collection Time: 07/02/21  3:24 PM  Result Value Ref Range   Folate 30.4 >5.9 ng/mL  Iron and TIBC     Status: Abnormal   Collection Time: 07/02/21  3:24 PM  Result Value Ref Range   Iron 20 (L) 28 - 170 ug/dL   TIBC 384 250 - 450 ug/dL   Saturation Ratios 5 (L) 10.4 - 31.8 %   UIBC 364 ug/dL  Ferritin     Status: Abnormal   Collection Time: 07/02/21  3:24 PM  Result Value Ref Range   Ferritin 8 (L) 11 - 307 ng/mL  Reticulocytes     Status: Abnormal   Collection Time: 07/02/21  3:24 PM  Result Value Ref Range   Retic Ct Pct 2.0 0.4 - 3.1 %   RBC. 2.17 (L) 3.87 - 5.11 MIL/uL   Retic Count, Absolute 42.7 19.0 - 186.0 K/uL   Immature Retic Fract 25.3 (H) 2.3 - 15.9 %  Type and screen [place]     Status: None   Collection Time: 07/02/21  3:33 PM  Result Value Ref Range   ABO/RH(D) A POS    Antibody Screen NEG    Sample Expiration      07/05/2021,2359 Performed at [facility], [place] [address] [place], [place] [address]   Resp Panel by RT-PCR (Flu A&B, Covid) Nasopharyngeal Swab     Status: None   Collection Time: 07/02/21  4:35 PM   Specimen: Nasopharyngeal Swab; Nasopharyngeal(NP) swabs in vial transport medium  Result Value Ref Range   SARS Coronavirus 2 by RT PCR NEGATIVE NEGATIVE   Influenza A by PCR NEGATIVE NEGATIVE   Influenza B by PCR NEGATIVE NEGATIVE  Brain  natriuretic peptide     Status: Abnormal   Collection Time: 07/02/21  6:05 PM  Result Value Ref Range   B Natriuretic Peptide 450.3 (H) 0.0 - 100.0 pg/mL  Troponin I (High Sensitivity)     Status: None   Collection Time: 07/02/21  6:05 PM  Result Value Ref Range   Troponin I (High Sensitivity) 13 <18 ng/L  Troponin I (High Sensitivity)     Status: None   Collection Time: 07/02/21  9:16 PM  Result Value Ref Range   Troponin I (High Sensitivity) 16 <18 ng/L  CBC     Status: Abnormal   Collection Time: 07/03/21  2:26 AM  Result Value Ref Range   WBC 4.0 4.0 - 10.5 K/uL   RBC 2.72 (L) 3.87 - 5.11 MIL/uL   Hemoglobin 8.0 (L) 12.0 - 15.0 g/dL   HCT 25.3 (L) 36.0 - 46.0 %   MCV 93.0 80.0 - 100.0 fL   MCH 29.4 26.0 - 34.0 pg   MCHC 31.6 30.0 - 36.0 g/dL   RDW 14.6 11.5 - 15.5 %   Platelets  128 (L) 150 - 400 K/uL   nRBC 0.0 0.0 - 0.2 %  Comprehensive metabolic panel     Status: Abnormal   Collection Time: 07/03/21  2:26 AM  Result Value Ref Range   Sodium 139 135 - 145 mmol/L   Potassium 3.9 3.5 - 5.1 mmol/L   Chloride 103 98 - 111 mmol/L   CO2 28 22 - 32 mmol/L   Glucose, Bld 108 (H) 70 - 99 mg/dL   BUN 19 8 - 23 mg/dL   Creatinine, Ser 0.87 0.44 - 1.00 mg/dL   Calcium 8.8 (L) 8.9 - 10.3 mg/dL   Total Protein 5.6 (L) 6.5 - 8.1 g/dL   Albumin 3.2 (L) 3.5 - 5.0 g/dL   AST 20 15 - 41 U/L   ALT 18 0 - 44 U/L   Alkaline Phosphatase 50 38 - 126 U/L   Total Bilirubin 0.6 0.3 - 1.2 mg/dL   GFR, Estimated >60 >60 mL/min   Anion gap 8 5 - 15     CT ABDOMEN PELVIS WO CONTRAST  Result Date: 07/02/2021 CLINICAL DATA:  Abdominal pain, evaluate for retroperitoneal hematoma EXAM: CT ABDOMEN AND PELVIS WITHOUT CONTRAST TECHNIQUE: Multidetector CT imaging of the abdomen and pelvis was performed following the standard protocol without IV contrast. RADIATION DOSE REDUCTION: This exam was performed according to the departmental dose-optimization program which includes automated exposure control,  adjustment of the mA and/or kV according to patient size and/or use of iterative reconstruction technique. COMPARISON:  None. FINDINGS: Lower chest: Heart is enlarged in size. Pacer leads are noted. There are coarse calcifications in the mitral annulus. Hepatobiliary: No focal abnormality is seen in the liver. There is mild nodularity in the liver surface. Surgical clips are seen in gallbladder fossa. Pancreas: No focal abnormality is seen. Spleen: Unremarkable. Adrenals/Urinary Tract: Adrenals are unremarkable. There is no hydronephrosis. There are no renal or ureteral stones. Urinary bladder is not distended. There are few low-density lesions in the renal cortex on both sides largest measuring 2.6 cm in diameter in the lower pole of right kidney suggesting renal cysts. Stomach/Bowel: Small hiatal hernia is seen. Stomach is not distended. Small bowel loops are not dilated. Appendix is not dilated. There is no significant wall thickening in colon. There is no pericolic stranding. Scattered diverticula are seen without signs of focal acute diverticulitis. Vascular/Lymphatic: There are small scattered arterial calcifications. There is no evidence of retroperitoneal hematoma. There scattered subcentimeter nodes in mesentery and retroperitoneum. Reproductive: Uterus is unremarkable. In image 60 of series 3, there is small area of fluid density in the right posterior pelvis which could not be definitely separated from a bowel loop suggesting possible fluid in the lumen of a bowel loop. There are no dominant adnexal masses. Other: There is no pneumoperitoneum. Small right inguinal hernia containing fat is seen. Musculoskeletal: Unremarkable. IMPRESSION: There is no evidence of intestinal obstruction or pneumoperitoneum. Appendix is not dilated. There is no hydronephrosis. There is no demonstrable retroperitoneal hematoma. Scattered diverticula are seen in colon without signs of focal acute diverticulitis. There few smooth  marginated low-density lesions in the kidneys suggesting renal cysts. There is mild nodularity in the liver surface which may be normal variation or suggest cirrhosis. Other findings as described in the body of the report. Electronically Signed   By: Elmer Picker M.D.   On: 07/02/2021 17:24   DG Chest 2 View  Result Date: 07/02/2021 CLINICAL DATA:  Shortness of breath and fatigue. EXAM: CHEST - 2 VIEW COMPARISON:  03/08/2021 and prior radiographs FINDINGS: Cardiomegaly and RIGHT-sided pacemaker again noted. There is no evidence of focal airspace disease, pulmonary edema, suspicious pulmonary nodule/mass, pleural effusion, or pneumothorax. No acute bony abnormalities are identified. IMPRESSION: Cardiomegaly without evidence of acute cardiopulmonary disease. Electronically Signed   By: Margarette Canada M.D.   On: 07/02/2021 14:43    ROS:  As stated above in the HPI otherwise negative.  Blood pressure (!) 117/56, pulse 62, temperature 98.4 F (36.9 C), temperature source Oral, resp. rate 19, height 5\' 3"  (1.6 m), weight 71.6 kg, SpO2 95 %.    PE: Gen: NAD, Alert and Oriented HEENT:  [place]/AT, EOMI Neck: Supple, no LAD Lungs: CTA Bilaterally CV: RRR without M/G/R ABD: Soft, NTND, +BS Ext: No C/C/E  Assessment/Plan: 1) IDA 2) PAF on Eliquis. 3) ? Melena.   It is not clear if she has melena.  With her age and presentation an EGD will be scheduled for further evaluation.  If the examination is negative a colonoscopy will need to be pursued.  Plan: 1) EGD tomorrow with [facility]. 2) Follow HGB and transfuse as necessary.  Clemence Stillings D 07/03/2021, 8:57 AM

## 2021-07-03 NOTE — Consult Note (Signed)
Reason for Consult: Symptomatic anemia Referring Physician: [place] HPI: This is a 88 year old female with a PMH of SSS s/p pacemaker, PAF on Eliquis, and HFpEF admitted for progressive fatigue, DOE,  and lower extremity edema.  On 06/28/2021 she was evaluated by her PCP for these complaints and her HGB was noted to be at 9.8 g/dL and previously her baseline was around 11-12 g/dL.  Her last HGB in Epic was on 11/18/2020 at 11.5 g/dL.  With IV hydration her current HGB is at 8.0 g/dL with an MCV of 93.  Her iron saturation was at 5 and her ferritin was at 8.  The patient does not report any problems with hematemesis, hematochezia, hemoptysis, or hematuria.  Heme testing was negative for this admission, but she reported having "dark stools".  She had an EGD with Dr. Carlean Purl on 09/13/2015 for epigastric pain complaints and it was a normal examination.  Prior to that time her last colonoscopy was performed on 02/20/2011, and it was essentially normal.  Past Medical History:  Diagnosis Date   Arthritis    Atrial flutter ([place])    s/p RFA 2003   Breast cancer ([place])    1980's - lumpectomy only   Colon polyps    Diverticulosis    DVT, lower extremity ([place]) 1998 and 1999   Bilateral   Essential hypertension    Gallstones    GERD (gastroesophageal reflux disease)    Glaucoma    IBS (irritable bowel syndrome)    Mixed hyperlipidemia    Osteoporosis    Persistent atrial fibrillation ([place])    Pneumonia 2011   Sick sinus syndrome ([facility])    PPM ([facility])   Urinary tract infection    Varicose veins     Past Surgical History:  Procedure Laterality Date   BREAST LUMPECTOMY  1980's   Right breast    CARDIAC CATHETERIZATION  1999   Normal coronaries   Cataracts Bilateral    CHOLECYSTECTOMY     COLONOSCOPY W/ BIOPSIES AND POLYPECTOMY  03/2003, 05/2008, 02/20/2011   severe diverticulosis, tubulovillous adenoma polyp, internal and external hemorrhoids 2012: severe diverticulosis,  4 mm polyp, hemorrhoids   CYST REMOVAL TRUNK     under breast = on belly    DILATION AND CURETTAGE OF UTERUS     EYE SURGERY     cataracts - bilateral    Fractured wrist Left 2013   repair   INSERT / REPLACE / REMOVE PACEMAKER     KNEE ARTHROSCOPY WITH MEDIAL MENISECTOMY Right 10/21/2014   Procedure: RIGHT KNEE ARTHROSCOPY WITH MEDIAL MENISECTOM,lateral menisectomy,synovectomy suprapatellar pouch;  Surgeon: Latanya Maudlin, MD;  Location: [facility];  Service: Orthopedics;  Laterality: Right;   PACEMAKER GENERATOR CHANGE  06/18/12   [facility] DR RF, Dr Rayann Heman   PACEMAKER INSERTION  2003   PERMANENT PACEMAKER GENERATOR CHANGE N/A 06/18/2012   Procedure: PERMANENT PACEMAKER GENERATOR CHANGE;  Surgeon: Thompson Grayer, MD;  Location: [facility] CATH LAB;  Service: Cardiovascular;  Laterality: N/A;   TOTAL KNEE ARTHROPLASTY  12/20/2011   Procedure: TOTAL KNEE ARTHROPLASTY;  Surgeon: Tobi Bastos, MD;  Location: [facility];  Service: Orthopedics;  Laterality: Left;   TOTAL KNEE ARTHROPLASTY Right 02/10/2015   Procedure: TOTAL RIGHT KNEE ARTHROPLASTY;  Surgeon: Latanya Maudlin, MD;  Location: [facility];  Service: Orthopedics;  Laterality: Right;    Family History  Problem Relation Age of Onset   Throat cancer Father    Stroke Mother    Alzheimer's  disease Mother    Alzheimer's disease Brother    Alzheimer's disease Sister    Colon cancer Maternal Grandfather    Stomach cancer Maternal Grandmother    Heart disease Paternal Grandmother    Heart attack Paternal Grandmother    Prostate cancer Son    Colon cancer Sister    Colon cancer Brother    Heart disease Brother    Atrial fibrillation Brother    Diabetes Brother    Dementia Brother    Parkinson's disease Brother    Prostate cancer Son     Social History:  reports that she has never smoked. She has never used smokeless tobacco. She reports that she does not drink alcohol and does not use drugs.  Allergies:  Allergies  Allergen Reactions   Iodinated  Contrast Media Hives, Itching and Rash    03/13/14: symptoms began within two hours of intrathecal injection (myelogram 03/11/14); on shoulders radiating down to stomach.  Relief with Benadryl.  Told patient she would need to pre-med with Benadryl in the future before receiving this contrast.  Brita Romp, RN   Naproxen Hives, Itching and Rash    Medications: Scheduled:  amiodarone  200 mg Oral Daily   calcium-vitamin D  1 tablet Oral Q breakfast   furosemide  40 mg Intravenous Q12H   latanoprost  1 drop Both Eyes QHS   multivitamin with minerals  1 tablet Oral q morning   nadolol  60 mg Oral Daily   pantoprazole  40 mg Oral BID   Continuous:  Results for orders placed or performed during the hospital encounter of 07/02/21 (from the past 24 hour(s))  CBC with Differential     Status: Abnormal   Collection Time: 07/02/21  1:56 PM  Result Value Ref Range   WBC 4.4 4.0 - 10.5 K/uL   RBC 2.99 (L) 3.87 - 5.11 MIL/uL   Hemoglobin 8.8 (L) 12.0 - 15.0 g/dL   HCT 28.8 (L) 36.0 - 46.0 %   MCV 96.3 80.0 - 100.0 fL   MCH 29.4 26.0 - 34.0 pg   MCHC 30.6 30.0 - 36.0 g/dL   RDW 14.6 11.5 - 15.5 %   Platelets 149 (L) 150 - 400 K/uL   nRBC 0.0 0.0 - 0.2 %   Neutrophils Relative % 51 %   Neutro Abs 2.3 1.7 - 7.7 K/uL   Lymphocytes Relative 30 %   Lymphs Abs 1.3 0.7 - 4.0 K/uL   Monocytes Relative 14 %   Monocytes Absolute 0.6 0.1 - 1.0 K/uL   Eosinophils Relative 4 %   Eosinophils Absolute 0.2 0.0 - 0.5 K/uL   Basophils Relative 1 %   Basophils Absolute 0.0 0.0 - 0.1 K/uL   Immature Granulocytes 0 %   Abs Immature Granulocytes 0.01 0.00 - 0.07 K/uL  Comprehensive metabolic panel     Status: Abnormal   Collection Time: 07/02/21  1:56 PM  Result Value Ref Range   Sodium 138 135 - 145 mmol/L   Potassium 4.3 3.5 - 5.1 mmol/L   Chloride 105 98 - 111 mmol/L   CO2 26 22 - 32 mmol/L   Glucose, Bld 110 (H) 70 - 99 mg/dL   BUN 21 8 - 23 mg/dL   Creatinine, Ser 0.93 0.44 - 1.00 mg/dL   Calcium  9.1 8.9 - 10.3 mg/dL   Total Protein 6.2 (L) 6.5 - 8.1 g/dL   Albumin 3.6 3.5 - 5.0 g/dL   AST 32 15 - 41 U/L  ALT 18 0 - 44 U/L   Alkaline Phosphatase 56 38 - 126 U/L   Total Bilirubin 1.6 (H) 0.3 - 1.2 mg/dL   GFR, Estimated 58 (L) >60 mL/min   Anion gap 7 5 - 15  POC occult blood, ED Provider will collect     Status: None   Collection Time: 07/02/21  3:18 PM  Result Value Ref Range   Fecal Occult Bld NEGATIVE NEGATIVE  Vitamin B12     Status: None   Collection Time: 07/02/21  3:24 PM  Result Value Ref Range   Vitamin B-12 542 180 - 914 pg/mL  Folate     Status: None   Collection Time: 07/02/21  3:24 PM  Result Value Ref Range   Folate 30.4 >5.9 ng/mL  Iron and TIBC     Status: Abnormal   Collection Time: 07/02/21  3:24 PM  Result Value Ref Range   Iron 20 (L) 28 - 170 ug/dL   TIBC 384 250 - 450 ug/dL   Saturation Ratios 5 (L) 10.4 - 31.8 %   UIBC 364 ug/dL  Ferritin     Status: Abnormal   Collection Time: 07/02/21  3:24 PM  Result Value Ref Range   Ferritin 8 (L) 11 - 307 ng/mL  Reticulocytes     Status: Abnormal   Collection Time: 07/02/21  3:24 PM  Result Value Ref Range   Retic Ct Pct 2.0 0.4 - 3.1 %   RBC. 2.17 (L) 3.87 - 5.11 MIL/uL   Retic Count, Absolute 42.7 19.0 - 186.0 K/uL   Immature Retic Fract 25.3 (H) 2.3 - 15.9 %  Type and screen [place]     Status: None   Collection Time: 07/02/21  3:33 PM  Result Value Ref Range   ABO/RH(D) A POS    Antibody Screen NEG    Sample Expiration      07/05/2021,2359 Performed at [facility], [place] [address], [place], [place] [address]   Resp Panel by RT-PCR (Flu A&B, Covid) Nasopharyngeal Swab     Status: None   Collection Time: 07/02/21  4:35 PM   Specimen: Nasopharyngeal Swab; Nasopharyngeal(NP) swabs in vial transport medium  Result Value Ref Range   SARS Coronavirus 2 by RT PCR NEGATIVE NEGATIVE   Influenza A by PCR NEGATIVE NEGATIVE   Influenza B by PCR NEGATIVE NEGATIVE  Brain  natriuretic peptide     Status: Abnormal   Collection Time: 07/02/21  6:05 PM  Result Value Ref Range   B Natriuretic Peptide 450.3 (H) 0.0 - 100.0 pg/mL  Troponin I (High Sensitivity)     Status: None   Collection Time: 07/02/21  6:05 PM  Result Value Ref Range   Troponin I (High Sensitivity) 13 <18 ng/L  Troponin I (High Sensitivity)     Status: None   Collection Time: 07/02/21  9:16 PM  Result Value Ref Range   Troponin I (High Sensitivity) 16 <18 ng/L  CBC     Status: Abnormal   Collection Time: 07/03/21  2:26 AM  Result Value Ref Range   WBC 4.0 4.0 - 10.5 K/uL   RBC 2.72 (L) 3.87 - 5.11 MIL/uL   Hemoglobin 8.0 (L) 12.0 - 15.0 g/dL   HCT 25.3 (L) 36.0 - 46.0 %   MCV 93.0 80.0 - 100.0 fL   MCH 29.4 26.0 - 34.0 pg   MCHC 31.6 30.0 - 36.0 g/dL   RDW 14.6 11.5 - 15.5 %   Platelets  128 (L) 150 - 400 K/uL   nRBC 0.0 0.0 - 0.2 %  Comprehensive metabolic panel     Status: Abnormal   Collection Time: 07/03/21  2:26 AM  Result Value Ref Range   Sodium 139 135 - 145 mmol/L   Potassium 3.9 3.5 - 5.1 mmol/L   Chloride 103 98 - 111 mmol/L   CO2 28 22 - 32 mmol/L   Glucose, Bld 108 (H) 70 - 99 mg/dL   BUN 19 8 - 23 mg/dL   Creatinine, Ser 0.87 0.44 - 1.00 mg/dL   Calcium 8.8 (L) 8.9 - 10.3 mg/dL   Total Protein 5.6 (L) 6.5 - 8.1 g/dL   Albumin 3.2 (L) 3.5 - 5.0 g/dL   AST 20 15 - 41 U/L   ALT 18 0 - 44 U/L   Alkaline Phosphatase 50 38 - 126 U/L   Total Bilirubin 0.6 0.3 - 1.2 mg/dL   GFR, Estimated >60 >60 mL/min   Anion gap 8 5 - 15     CT ABDOMEN PELVIS WO CONTRAST  Result Date: 07/02/2021 CLINICAL DATA:  Abdominal pain, evaluate for retroperitoneal hematoma EXAM: CT ABDOMEN AND PELVIS WITHOUT CONTRAST TECHNIQUE: Multidetector CT imaging of the abdomen and pelvis was performed following the standard protocol without IV contrast. RADIATION DOSE REDUCTION: This exam was performed according to the departmental dose-optimization program which includes automated exposure control,  adjustment of the mA and/or kV according to patient size and/or use of iterative reconstruction technique. COMPARISON:  None. FINDINGS: Lower chest: Heart is enlarged in size. Pacer leads are noted. There are coarse calcifications in the mitral annulus. Hepatobiliary: No focal abnormality is seen in the liver. There is mild nodularity in the liver surface. Surgical clips are seen in gallbladder fossa. Pancreas: No focal abnormality is seen. Spleen: Unremarkable. Adrenals/Urinary Tract: Adrenals are unremarkable. There is no hydronephrosis. There are no renal or ureteral stones. Urinary bladder is not distended. There are few low-density lesions in the renal cortex on both sides largest measuring 2.6 cm in diameter in the lower pole of right kidney suggesting renal cysts. Stomach/Bowel: Small hiatal hernia is seen. Stomach is not distended. Small bowel loops are not dilated. Appendix is not dilated. There is no significant wall thickening in colon. There is no pericolic stranding. Scattered diverticula are seen without signs of focal acute diverticulitis. Vascular/Lymphatic: There are small scattered arterial calcifications. There is no evidence of retroperitoneal hematoma. There scattered subcentimeter nodes in mesentery and retroperitoneum. Reproductive: Uterus is unremarkable. In image 60 of series 3, there is small area of fluid density in the right posterior pelvis which could not be definitely separated from a bowel loop suggesting possible fluid in the lumen of a bowel loop. There are no dominant adnexal masses. Other: There is no pneumoperitoneum. Small right inguinal hernia containing fat is seen. Musculoskeletal: Unremarkable. IMPRESSION: There is no evidence of intestinal obstruction or pneumoperitoneum. Appendix is not dilated. There is no hydronephrosis. There is no demonstrable retroperitoneal hematoma. Scattered diverticula are seen in colon without signs of focal acute diverticulitis. There few smooth  marginated low-density lesions in the kidneys suggesting renal cysts. There is mild nodularity in the liver surface which may be normal variation or suggest cirrhosis. Other findings as described in the body of the report. Electronically Signed   By: Elmer Picker M.D.   On: 07/02/2021 17:24   DG Chest 2 View  Result Date: 07/02/2021 CLINICAL DATA:  Shortness of breath and fatigue. EXAM: CHEST - 2 VIEW COMPARISON:  03/08/2021 and prior radiographs FINDINGS: Cardiomegaly and RIGHT-sided pacemaker again noted. There is no evidence of focal airspace disease, pulmonary edema, suspicious pulmonary nodule/mass, pleural effusion, or pneumothorax. No acute bony abnormalities are identified. IMPRESSION: Cardiomegaly without evidence of acute cardiopulmonary disease. Electronically Signed   By: Margarette Canada M.D.   On: 07/02/2021 14:43    ROS:  As stated above in the HPI otherwise negative.  Blood pressure (!) 117/56, pulse 62, temperature 98.4 F (36.9 C), temperature source Oral, resp. rate 19, height 5\' 3"  (1.6 m), weight 71.6 kg, SpO2 95 %.    PE: Gen: NAD, Alert and Oriented HEENT:  [place]/AT, EOMI Neck: Supple, no LAD Lungs: CTA Bilaterally CV: RRR without M/G/R ABD: Soft, NTND, +BS Ext: No C/C/E  Assessment/Plan: 1) IDA 2) PAF on Eliquis. 3) ? Melena.   It is not clear if she has melena.  With her age and presentation an EGD will be scheduled for further evaluation.  If the examination is negative a colonoscopy will need to be pursued.  Plan: 1) EGD tomorrow with [facility]. 2) Follow HGB and transfuse as necessary.  Maya Scholer D 07/03/2021, 8:57 AM

## 2021-07-03 NOTE — TOC Progression Note (Signed)
Transition of Care [facility]) - Progression Note    Patient Details  Name: [name] MRN: [id number] Date of Birth: [birth year]  Transition of Care [facility]) CM/SW Contact  [name], RN Phone Number: 07/03/2021, 1:13 PM  Clinical Narrative:     Transition of Care [facility]) Screening Note   Patient Details  Name: [name] Date of Birth: [birth year]   Transition of Care [facility]) CM/SW Contact:    [name], RN Phone Number: 07/03/2021, 1:14 PM    Transition of Care Department [facility]) has reviewed patient and no TOC needs have been identified at this time. We will continue to monitor patient advancement through interdisciplinary progression rounds. If new patient transition needs arise, please place a TOC consult.          Expected Discharge Plan and Services                                                 Social Determinants of Health (SDOH) Interventions    Readmission Risk Interventions No flowsheet data found.

## 2021-07-03 NOTE — Progress Notes (Signed)
PROGRESS NOTE        PATIENT DETAILS Name: [name] Age: 88 y.o. Sex: female Date of Birth: February 24, 1929 Admit Date: 07/02/2021 Admitting Physician Evalee Mutton Kristeen Mans, MD CBJ:[id number], Fransisca Kaufmann, MD  Brief Narrative: Patient is a 88 y.o. female with history of HFpEF, PAF, sick sinus syndrome-s/p PPM in place, VTE-presenting with fatigue/exertional dyspnea/leg edema-found to have HFpEF exacerbation and new iron deficiency anemia.  Subjective: Feels better-significant decrease in lower extremity edema overnight.  No chest pain.  Objective: Vitals: Blood pressure (!) 117/56, pulse 62, temperature 98.4 F (36.9 C), temperature source Oral, resp. rate 19, height 5\' 3"  (1.6 m), weight 71.6 kg, SpO2 95 %.   Exam: Gen Exam:Alert awake-not in any distress HEENT:atraumatic, normocephalic Chest: B/L clear to auscultation anteriorly CVS:S1S2 regular Abdomen:soft non tender, non distended Extremities: Trace edema Neurology: Non focal Skin: no rash  Pertinent Labs/Radiology: CBC Latest Ref Rng & Units 07/03/2021 07/02/2021 07/01/2021  WBC 4.0 - 10.5 K/uL 4.0 4.4 4.1  Hemoglobin 12.0 - 15.0 g/dL 8.0(L) 8.8(L) 8.6(LL)  Hematocrit 36.0 - 46.0 % 25.3(L) 28.8(L) 26.4(L)  Platelets 150 - 400 K/uL 128(L) 149(L) 150    Lab Results  Component Value Date   NA 139 07/03/2021   K 3.9 07/03/2021   CL 103 07/03/2021   CO2 28 07/03/2021      Assessment/Plan: * Acute on chronic diastolic (congestive) heart failure ([place])- (present on admission) Leg swelling/dyspnea has improved following IV Lasix initiation.  Await echo.  Continue IV Lasix for 1 additional day-suspect can be transition to oral route tomorrow.  Continue to monitor electrolytes/intake/output and weights closely.  Awaiting updated 2D echocardiogram.   Iron deficiency anemia- (present on admission) No melanotic/bloody stools over the past few weeks-however today she does acknowledge having "dark"  stools intermittently over the past several months.  She is on Eliquis.  She may have had a unnoticed recent GI bleed or may have a slow oozing from a GI tract.  Thankfully FOBT is negative.  Have started IV iron supplementation-have consulted GI today to see if she would benefit from an endoscopic evaluation (highly functional 88 year old)-as would help decide long-term Eliquis use.  Suspect some of her symptoms of fatigue/shortness of breath is from worsening anemia-although there is some significant contribution from CHF.  Continue to follow CBC closely-and transfuse if overt bleeding or if hemoglobin is <7.  Essential hypertension, benign- (present on admission) BP stable-continue nadolol.  Follow BP trend and adjust medications accordingly.  Paroxysmal atrial fibrillation ([place])- (present on admission) Paced rhythm on telemetry-continue amiodarone, nadolol-Eliquis on hold-in case she requires endoscopic evaluation-await GI input.   Sick sinus syndrome ([place])- (present on admission) S/p PPM placement-monitor on telemetry.  VTE (venous thromboembolism)- (present on admission) Has a history of DVT several years ago-in both of her lower extremities-Eliquis on hold until GI evaluation is completed.  B12 deficiency- (present on admission) B12 levels appear appropriate-continue monthly supplementation in the outpatient setting.  GERD (gastroesophageal reflux disease)- (present on admission) Continue PPI  BMI Estimated body mass index is 27.95 kg/m as calculated from the following:   Height as of this encounter: 5\' 3"  (1.6 m).   Weight as of this encounter: 71.6 kg.    DVT Prophylaxis: SCD's Procedures: None Consults: GI Code Status:Full code  Family Communication: None at bedside   Disposition Plan: Status is: Inpatient  Remains  inpatient appropriate because: HFpEF exacerbation-on IV Lasix-not yet back to baseline-GI evaluation in progress for possible endoscopic evaluation.  Not yet  stable for discharge.   Diet: Diet Order             Diet Heart Room service appropriate? Yes; Fluid consistency: Thin; Fluid restriction: 1200 mL Fluid  Diet effective now                     Antimicrobial agents: Anti-infectives (From admission, onward)    None        MEDICATIONS: Scheduled Meds:  amiodarone  200 mg Oral Daily   calcium-vitamin D  1 tablet Oral Q breakfast   furosemide  40 mg Intravenous Q12H   latanoprost  1 drop Both Eyes QHS   multivitamin with minerals  1 tablet Oral q morning   nadolol  60 mg Oral Daily   pantoprazole  40 mg Oral BID   Continuous Infusions: PRN Meds:.acetaminophen, melatonin, nitroGLYCERIN, ondansetron **OR** ondansetron (ZOFRAN) IV, traMADol   I have personally reviewed following labs and imaging studies  LABORATORY DATA: CBC: Recent Labs  Lab 06/28/21 1055 07/01/21 0909 07/02/21 1356 07/03/21 0226  WBC 4.0 4.1 4.4 4.0  NEUTROABS 1.9 1.9 2.3  --   HGB 9.8* 8.6* 8.8* 8.0*  HCT 29.9* 26.4* 28.8* 25.3*  MCV 92 90 96.3 93.0  PLT 177 150 149* 128*    Basic Metabolic Panel: Recent Labs  Lab 06/28/21 1055 07/02/21 1356 07/03/21 0226  NA 138 138 139  K 4.7 4.3 3.9  CL 100 105 103  CO2 25 26 28   GLUCOSE 93 110* 108*  BUN 16 21 19   CREATININE 0.92 0.93 0.87  CALCIUM 9.3 9.1 8.8*    GFR: Estimated Creatinine Clearance: 40.8 mL/min (by C-G formula based on SCr of 0.87 mg/dL).  Liver Function Tests: Recent Labs  Lab 06/28/21 1055 07/02/21 1356 07/03/21 0226  AST 25 32 20  ALT 23 18 18   ALKPHOS 70 56 50  BILITOT 0.5 1.6* 0.6  PROT 6.4 6.2* 5.6*  ALBUMIN 4.2 3.6 3.2*   No results for input(s): LIPASE, AMYLASE in the last 168 hours. No results for input(s): AMMONIA in the last 168 hours.  Coagulation Profile: No results for input(s): INR, PROTIME in the last 168 hours.  Cardiac Enzymes: No results for input(s): CKTOTAL, CKMB, CKMBINDEX, TROPONINI in the last 168 hours.  BNP (last 3  results) No results for input(s): PROBNP in the last 8760 hours.  Lipid Profile: No results for input(s): CHOL, HDL, LDLCALC, TRIG, CHOLHDL, LDLDIRECT in the last 72 hours.  Thyroid Function Tests: No results for input(s): TSH, T4TOTAL, FREET4, T3FREE, THYROIDAB in the last 72 hours.  Anemia Panel: Recent Labs    07/02/21 1524  VITAMINB12 542  FOLATE 30.4  FERRITIN 8*  TIBC 384  IRON 20*  RETICCTPCT 2.0    Urine analysis:    Component Value Date/Time   COLORURINE YELLOW 08/19/2015 [place] 08/19/2015 1222   LABSPEC 1.007 08/19/2015 1222   PHURINE 7.0 08/19/2015 1222   GLUCOSEU NEGATIVE 08/19/2015 1222   HGBUR NEGATIVE 08/19/2015 1222   BILIRUBINUR NEGATIVE 08/19/2015 1222   KETONESUR NEGATIVE 08/19/2015 1222   PROTEINUR NEGATIVE 08/19/2015 1222   UROBILINOGEN 0.2 02/03/2015 1518   NITRITE NEGATIVE 08/19/2015 1222   LEUKOCYTESUR NEGATIVE 08/19/2015 1222    Sepsis Labs: Lactic Acid, Venous No results found for: LATICACIDVEN  MICROBIOLOGY: Recent Results (from the past 240 hour(s))  Resp Panel by  RT-PCR (Flu A&B, Covid) Nasopharyngeal Swab     Status: None   Collection Time: 07/02/21  4:35 PM   Specimen: Nasopharyngeal Swab; Nasopharyngeal(NP) swabs in vial transport medium  Result Value Ref Range Status   SARS Coronavirus 2 by RT PCR NEGATIVE NEGATIVE Final    Comment: (NOTE) SARS-CoV-2 target nucleic acids are NOT DETECTED.  The SARS-CoV-2 RNA is generally detectable in upper respiratory specimens during the acute phase of infection. The lowest concentration of SARS-CoV-2 viral copies this assay can detect is 138 copies/mL. A negative result does not preclude SARS-Cov-2 infection and should not be used as the sole basis for treatment or other patient management decisions. A negative result may occur with  improper specimen collection/handling, submission of specimen other than nasopharyngeal swab, presence of viral mutation(s) within  the areas targeted by this assay, and inadequate number of viral copies(<138 copies/mL). A negative result must be combined with clinical observations, patient history, and epidemiological information. The expected result is Negative.  Fact Sheet for Patients:  EntrepreneurPulse.com.au  Fact Sheet for Healthcare Providers:  IncredibleEmployment.be  This test is no t yet approved or cleared by the [place] FDA and  has been authorized for detection and/or diagnosis of SARS-CoV-2 by FDA under an Emergency Use Authorization (EUA). This EUA will remain  in effect (meaning this test can be used) for the duration of the COVID-19 declaration under Section 564(b)(1) of the Act, 21 U.S.C.section 360bbb-3(b)(1), unless the authorization is terminated  or revoked sooner.       Influenza A by PCR NEGATIVE NEGATIVE Final   Influenza B by PCR NEGATIVE NEGATIVE Final    Comment: (NOTE) The Xpert Xpress SARS-CoV-2/FLU/RSV plus assay is intended as an aid in the diagnosis of influenza from Nasopharyngeal swab specimens and should not be used as a sole basis for treatment. Nasal washings and aspirates are unacceptable for Xpert Xpress SARS-CoV-2/FLU/RSV testing.  Fact Sheet for Patients: EntrepreneurPulse.com.au  Fact Sheet for Healthcare Providers: IncredibleEmployment.be  This test is not yet approved or cleared by the [place] FDA and has been authorized for detection and/or diagnosis of SARS-CoV-2 by FDA under an Emergency Use Authorization (EUA). This EUA will remain in effect (meaning this test can be used) for the duration of the COVID-19 declaration under Section 564(b)(1) of the Act, 21 U.S.C. section 360bbb-3(b)(1), unless the authorization is terminated or revoked.  Performed at [facility], [place] [address] [place], [place] [address]     RADIOLOGY STUDIES/RESULTS: CT ABDOMEN PELVIS WO  CONTRAST  Result Date: 07/02/2021 CLINICAL DATA:  Abdominal pain, evaluate for retroperitoneal hematoma EXAM: CT ABDOMEN AND PELVIS WITHOUT CONTRAST TECHNIQUE: Multidetector CT imaging of the abdomen and pelvis was performed following the standard protocol without IV contrast. RADIATION DOSE REDUCTION: This exam was performed according to the departmental dose-optimization program which includes automated exposure control, adjustment of the mA and/or kV according to patient size and/or use of iterative reconstruction technique. COMPARISON:  None. FINDINGS: Lower chest: Heart is enlarged in size. Pacer leads are noted. There are coarse calcifications in the mitral annulus. Hepatobiliary: No focal abnormality is seen in the liver. There is mild nodularity in the liver surface. Surgical clips are seen in gallbladder fossa. Pancreas: No focal abnormality is seen. Spleen: Unremarkable. Adrenals/Urinary Tract: Adrenals are unremarkable. There is no hydronephrosis. There are no renal or ureteral stones. Urinary bladder is not distended. There are few low-density lesions in the renal cortex on both sides largest measuring 2.6 cm in diameter in  the lower pole of right kidney suggesting renal cysts. Stomach/Bowel: Small hiatal hernia is seen. Stomach is not distended. Small bowel loops are not dilated. Appendix is not dilated. There is no significant wall thickening in colon. There is no pericolic stranding. Scattered diverticula are seen without signs of focal acute diverticulitis. Vascular/Lymphatic: There are small scattered arterial calcifications. There is no evidence of retroperitoneal hematoma. There scattered subcentimeter nodes in mesentery and retroperitoneum. Reproductive: Uterus is unremarkable. In image 60 of series 3, there is small area of fluid density in the right posterior pelvis which could not be definitely separated from a bowel loop suggesting possible fluid in the lumen of a bowel loop. There are no  dominant adnexal masses. Other: There is no pneumoperitoneum. Small right inguinal hernia containing fat is seen. Musculoskeletal: Unremarkable. IMPRESSION: There is no evidence of intestinal obstruction or pneumoperitoneum. Appendix is not dilated. There is no hydronephrosis. There is no demonstrable retroperitoneal hematoma. Scattered diverticula are seen in colon without signs of focal acute diverticulitis. There few smooth marginated low-density lesions in the kidneys suggesting renal cysts. There is mild nodularity in the liver surface which may be normal variation or suggest cirrhosis. Other findings as described in the body of the report. Electronically Signed   By: Elmer Picker M.D.   On: 07/02/2021 17:24   DG Chest 2 View  Result Date: 07/02/2021 CLINICAL DATA:  Shortness of breath and fatigue. EXAM: CHEST - 2 VIEW COMPARISON:  03/08/2021 and prior radiographs FINDINGS: Cardiomegaly and RIGHT-sided pacemaker again noted. There is no evidence of focal airspace disease, pulmonary edema, suspicious pulmonary nodule/mass, pleural effusion, or pneumothorax. No acute bony abnormalities are identified. IMPRESSION: Cardiomegaly without evidence of acute cardiopulmonary disease. Electronically Signed   By: Margarette Canada M.D.   On: 07/02/2021 14:43   CUP PACEART REMOTE DEVICE CHECK  Result Date: 07/03/2021 Scheduled remote reviewed. Normal device function.  The device estimates 1 month until ERI Next remote 08/02/2021. Kathy Breach, RN, CCDS, [employer]    LOS: 1 day   Oren Binet, MD  [facility]    To contact the attending provider between 7A-7P or the covering provider during after hours 7P-7A, please log into the web site [email or web address] and access using universal [place] password for that web site. If you do not have the password, please call the hospital operator.  07/03/2021, 11:06 AM

## 2021-07-03 NOTE — Evaluation (Signed)
Physical Therapy Evaluation & Discharge Patient Details Name: [name] MRN: [id number] DOB: 02-10-1929 Today's Date: 07/03/2021  History of Present Illness  Pt is a 88 y.o. female who presented 07/02/21 with fatigue, exertional dyspnea, and worsening lower extremity edema x1 week. Admitted with combination of worsening anemia and decompensated diastolic heart failure. Being worked up for possible GI bleed. PMH: HFpEF, PAF, sick sinus syndrome s/p PPM implantation, VTE on anticoagulation with Eliquis   Clinical Impression  Pt presents with condition above. PTA, she was living alone in a house with a ramped entrance, functioning mod I with a SPC or rollator for mobility. Currently, she is at her baseline, not displaying any LOB or need for assistance with all functional mobility when using a rollator. She displays some generalized weakness, more so in her shoulders, but reports this is normal for her. Educated pt on frequent activity, limiting sodium and processed foods, and weighing self daily to manage her CHF, but pt reports she is already compliant with these tips. All education completed and questions answered. PT will sign off. Thank you.       Recommendations for follow up therapy are one component of a multi-disciplinary discharge planning process, led by the attending physician.  Recommendations may be updated based on patient status, additional functional criteria and insurance authorization.  Follow Up Recommendations No PT follow up    Assistance Recommended at Discharge PRN  Patient can return home with the following  Help with stairs or ramp for entrance;Assistance with cooking/housework    Equipment Recommendations None recommended by PT  Recommendations for Other Services       Functional Status Assessment Patient has not had a recent decline in their functional status     Precautions / Restrictions Precautions Precautions: Fall Restrictions Weight Bearing  Restrictions: No      Mobility  Bed Mobility               General bed mobility comments: Pt up in recliner upon arrival.    Transfers Overall transfer level: Modified independent Equipment used: Rollator (4 wheels)               General transfer comment: Pt is able to come to stand slowly, but safely.    Ambulation/Gait Ambulation/Gait assistance: Modified independent (Device/Increase time) Gait Distance (Feet): 240 Feet Assistive device: Rollator (4 wheels) Gait Pattern/deviations: Step-through pattern, Decreased stride length, Trunk flexed Gait velocity: reduced Gait velocity interpretation: 1.31 - 2.62 ft/sec, indicative of limited community ambulator   General Gait Details: Pt with slow, but steady gait with kyphotic posture, no LOB.  Stairs            Wheelchair Mobility    Modified Rankin (Stroke Patients Only)       Balance Overall balance assessment: Needs assistance Sitting-balance support: No upper extremity supported, Feet supported Sitting balance-Leahy Scale: Normal Sitting balance - Comments: reaches off BOS to manage socks safely   Standing balance support: Bilateral upper extremity supported, Reliant on assistive device for balance Standing balance-Leahy Scale: Poor Standing balance comment: Reliant on rollator                             Pertinent Vitals/Pain Pain Assessment Pain Assessment: Faces Faces Pain Scale: No hurt Pain Intervention(s): Monitored during session    Home Living Family/patient expects to be discharged to:: Private residence Living Arrangements: Alone Available Help at Discharge: Family;Available 24 hours/day Type of Home: House  Home Access: Ramped entrance       Home Layout: One level;Laundry or work area in basement (does not go in basement anymore) Home Equipment: Rollator (4 wheels);Shower seat;Grab bars - tub/shower;Grab bars - toilet;BSC/3in1;Cane - single point      Prior  Function Prior Level of Function : Independent/Modified Independent             Mobility Comments: Pt uses rollator and intermittently SPC for mod I mobility. x1 fall posteriorly when in yard. ADLs Comments: Someone does a deep clean of the home each week     Hand Dominance        Extremity/Trunk Assessment   Upper Extremity Assessment Upper Extremity Assessment: Generalized weakness (MMT scores of 4- shoulder flexion bil, 4+ elbow flexion bil; denies numbness/tingling)    Lower Extremity Assessment Lower Extremity Assessment: Generalized weakness (MMT scores of 4 hip flexion bil, 5 knee extension bil; denies numbness/tingling bil)    Cervical / Trunk Assessment Cervical / Trunk Assessment: Kyphotic  Communication   Communication: No difficulties  Cognition Arousal/Alertness: Awake/alert Behavior During Therapy: WFL for tasks assessed/performed Overall Cognitive Status: Within Functional Limits for tasks assessed                                          General Comments General comments (skin integrity, edema, etc.): Educated pt on reducing sodium and processed foods, mobilizing regularly, and weighing self daily, which she reports she already does    Exercises     Assessment/Plan    PT Assessment Patient does not need any further PT services  PT Problem List         PT Treatment Interventions      PT Goals (Current goals can be found in the Care Plan section)  Acute Rehab PT Goals Patient Stated Goal: to feel better PT Goal Formulation: All assessment and education complete, DC therapy Time For Goal Achievement: 07/04/21 Potential to Achieve Goals: Good    Frequency       Co-evaluation               AM-PAC PT "6 Clicks" Mobility  Outcome Measure Help needed turning from your back to your side while in a flat bed without using bedrails?: None Help needed moving from lying on your back to sitting on the side of a flat bed without  using bedrails?: None Help needed moving to and from a bed to a chair (including a wheelchair)?: None Help needed standing up from a chair using your arms (e.g., wheelchair or bedside chair)?: None Help needed to walk in hospital room?: None Help needed climbing 3-5 steps with a railing? : A Little 6 Click Score: 23    End of Session   Activity Tolerance: Patient tolerated treatment well Patient left: in chair;with call bell/phone within reach Nurse Communication: Mobility status PT Visit Diagnosis: Other abnormalities of gait and mobility (R26.89);Muscle weakness (generalized) (M62.81)    Time: 5643-3295 PT Time Calculation (min) (ACUTE ONLY): 19 min   Charges:   PT Evaluation $PT Eval Moderate Complexity: 1 Mod          Moishe Spice, PT, DPT Acute Rehabilitation Services  Pager: [phone] Office: [phone]   Orvan Falconer 07/03/2021, 12:51 PM

## 2021-07-03 NOTE — Progress Notes (Signed)
*  PRELIMINARY RESULTS* Echocardiogram 2D Echocardiogram has been performed.  [name] 07/03/2021, 5:00 PM

## 2021-07-03 NOTE — Progress Notes (Signed)
OT Cancellation Note  Patient Details Name: [name] MRN: [id number] DOB: Nov 18, 1928   Cancelled Treatment:    Reason Eval/Treat Not Completed: OT screened, no needs identified, will sign off. PT assessed pt and reports that she is back to her baseline, safe to return home with no follow up therapy, once medically stable. Pt has no OT needs and acute OT will sign off. Please re-consult if needs change.   Mariah Harn H., OTR/L Acute Rehabilitation  [name] 07/03/2021, 2:53 PM

## 2021-07-04 ENCOUNTER — Inpatient Hospital Stay (HOSPITAL_COMMUNITY): Payer: Medicare Other | Admitting: Anesthesiology

## 2021-07-04 ENCOUNTER — Other Ambulatory Visit: Payer: Self-pay | Admitting: Nurse Practitioner

## 2021-07-04 ENCOUNTER — Encounter (HOSPITAL_COMMUNITY): Payer: Self-pay | Admitting: Internal Medicine

## 2021-07-04 ENCOUNTER — Encounter (HOSPITAL_COMMUNITY)
Admission: EM | Disposition: A | Discharge status: Home Health | Payer: Self-pay | Source: Home / Self Care | Attending: Internal Medicine

## 2021-07-04 ENCOUNTER — Ambulatory Visit (INDEPENDENT_AMBULATORY_CARE_PROVIDER_SITE_OTHER): Payer: Medicare Other

## 2021-07-04 DIAGNOSIS — I495 Sick sinus syndrome: Secondary | ICD-10-CM

## 2021-07-04 DIAGNOSIS — K3189 Other diseases of stomach and duodenum: Secondary | ICD-10-CM

## 2021-07-04 HISTORY — PX: BIOPSY: SHX5522

## 2021-07-04 HISTORY — PX: ESOPHAGOGASTRODUODENOSCOPY (EGD) WITH PROPOFOL: SHX5813

## 2021-07-04 LAB — BASIC METABOLIC PANEL
Anion gap: 4 — ABNORMAL LOW (ref 5–15)
BUN: 16 mg/dL (ref 8–23)
CO2: 27 mmol/L (ref 22–32)
Calcium: 8.5 mg/dL — ABNORMAL LOW (ref 8.9–10.3)
Chloride: 106 mmol/L (ref 98–111)
Creatinine, Ser: 1.04 mg/dL — ABNORMAL HIGH (ref 0.44–1.00)
GFR, Estimated: 51 mL/min — ABNORMAL LOW (ref >60)
Glucose, Bld: 96 mg/dL (ref 70–99)
Potassium: 4.1 mmol/L (ref 3.5–5.1)
Sodium: 137 mmol/L (ref 135–145)

## 2021-07-04 LAB — CBC WITH DIFFERENTIAL/PLATELET
Basophils Absolute: 0 10*3/uL (ref 0.0–0.2)
Basos: 1 %
EOS (ABSOLUTE): 0.1 10*3/uL (ref 0.0–0.4)
Eos: 3 %
Hematocrit: 26.4 % — ABNORMAL LOW (ref 34.0–46.6)
Hemoglobin: 8.6 g/dL — CL (ref 11.1–15.9)
Immature Grans (Abs): 0 10*3/uL (ref 0.0–0.1)
Immature Granulocytes: 0 %
Lymphocytes Absolute: 1.4 10*3/uL (ref 0.7–3.1)
Lymphs: 35 %
MCH: 29.4 pg (ref 26.6–33.0)
MCHC: 32.6 g/dL (ref 31.5–35.7)
MCV: 90 fL (ref 79–97)
Monocytes Absolute: 0.6 10*3/uL (ref 0.1–0.9)
Monocytes: 14 %
Neutrophils Absolute: 1.9 10*3/uL (ref 1.4–7.0)
Neutrophils: 47 %
Platelets: 150 10*3/uL (ref 150–450)
RBC: 2.93 x10E6/uL — ABNORMAL LOW (ref 3.77–5.28)
RDW: 13.7 % (ref 11.7–15.4)
WBC: 4.1 10*3/uL (ref 3.4–10.8)

## 2021-07-04 LAB — CBC
HCT: 26.2 % — ABNORMAL LOW (ref 36.0–46.0)
Hemoglobin: 8.5 g/dL — ABNORMAL LOW (ref 12.0–15.0)
MCH: 30.2 pg (ref 26.0–34.0)
MCHC: 32.4 g/dL (ref 30.0–36.0)
MCV: 93.2 fL (ref 80.0–100.0)
Platelets: 130 10*3/uL — ABNORMAL LOW (ref 150–400)
RBC: 2.81 MIL/uL — ABNORMAL LOW (ref 3.87–5.11)
RDW: 14.6 % (ref 11.5–15.5)
WBC: 4.3 10*3/uL (ref 4.0–10.5)
nRBC: 0 % (ref 0.0–0.2)

## 2021-07-04 SURGERY — ESOPHAGOGASTRODUODENOSCOPY (EGD) WITH PROPOFOL
Anesthesia: Monitor Anesthesia Care

## 2021-07-04 MED ORDER — PROPOFOL 500 MG/50ML IV EMUL
INTRAVENOUS | Status: DC | PRN
  Administered 2021-07-04: 100 ug/kg/min via INTRAVENOUS

## 2021-07-04 MED ORDER — FUROSEMIDE 40 MG PO TABS
40.0000 mg | ORAL_TABLET | Freq: Every day | ORAL | Status: DC
  Administered 2021-07-04 – 2021-07-05 (×2): 40 mg via ORAL
  Filled 2021-07-04 (×2): qty 1

## 2021-07-04 MED ORDER — PEG-KCL-NACL-NASULF-NA ASC-C 100 G PO SOLR: 1.0000 | Freq: Once | ORAL | Status: DC

## 2021-07-04 MED ORDER — PEG-KCL-NACL-NASULF-NA ASC-C 100 G PO SOLR
0.5000 | Freq: Once | ORAL | Status: DC
  Filled 2021-07-04: qty 1

## 2021-07-04 MED ORDER — SODIUM CHLORIDE 0.9 % IV SOLN
250.0000 mg | Freq: Every day | INTRAVENOUS | Status: DC
  Administered 2021-07-05: 09:00:00 250 mg via INTRAVENOUS
  Filled 2021-07-04 (×2): qty 20

## 2021-07-04 MED ORDER — PROPOFOL 10 MG/ML IV BOLUS
INTRAVENOUS | Status: DC | PRN
  Administered 2021-07-04: 10 mg via INTRAVENOUS

## 2021-07-04 MED ORDER — LACTATED RINGERS IV SOLN: INTRAVENOUS | Status: DC | PRN

## 2021-07-04 MED ORDER — BISACODYL 5 MG PO TBEC: 10.0000 mg | DELAYED_RELEASE_TABLET | Freq: Once | ORAL | Status: DC

## 2021-07-04 MED ORDER — LIDOCAINE 2% (20 MG/ML) 5 ML SYRINGE
INTRAMUSCULAR | Status: DC | PRN
  Administered 2021-07-04 (×2): 40 mg via INTRAVENOUS

## 2021-07-04 MED ORDER — METOCLOPRAMIDE HCL 5 MG/ML IJ SOLN: 5.0000 mg | Freq: Four times a day (QID) | INTRAMUSCULAR | Status: AC

## 2021-07-04 MED ORDER — PHENYLEPHRINE 40 MCG/ML (10ML) SYRINGE FOR IV PUSH (FOR BLOOD PRESSURE SUPPORT)
PREFILLED_SYRINGE | INTRAVENOUS | Status: DC | PRN
  Administered 2021-07-04 (×4): 80 ug via INTRAVENOUS

## 2021-07-04 SURGICAL SUPPLY — 15 items

## 2021-07-04 NOTE — Progress Notes (Signed)
Confirmed with pt and sons they have decided NOT to go ahead with Colonoscopy.

## 2021-07-04 NOTE — Anesthesia Preprocedure Evaluation (Addendum)
Anesthesia Evaluation  Patient identified by MRN, date of birth, ID band Patient awake    Reviewed: Allergy & Precautions, NPO status , Patient's Chart, lab work & pertinent test results, reviewed documented beta blocker date and time   Airway Mallampati: II  TM Distance: >3 FB Neck ROM: Full    Dental  (+) Poor Dentition, Dental Advisory Given, Chipped,    Pulmonary neg pulmonary ROS,    Pulmonary exam normal breath sounds clear to auscultation       Cardiovascular hypertension, Pt. on medications and Pt. on home beta blockers + CAD, +CHF and + DVT  Normal cardiovascular exam+ dysrhythmias Atrial Fibrillation + pacemaker (PPM for SSS) + Valvular Problems/Murmurs (mild/mod AS) AS  Rhythm:Regular Rate:Normal  TTE 2023 1. Left ventricular ejection fraction, by estimation, is 60 to 65%. The  left ventricle has normal function. The left ventricle has no regional  wall motion abnormalities. There is moderate left ventricular hypertrophy.  Left ventricular diastolic  parameters are indeterminate.  2. Right ventricular systolic function is mildly reduced. The right  ventricular size is mildly enlarged. Moderately increased right  ventricular wall thickness. There is moderately elevated pulmonary artery  systolic pressure. The estimated right  ventricular systolic pressure is 02.6 mmHg.  3. Left atrial size was moderately dilated.  4. Right atrial size was moderately dilated.  5. A small pericardial effusion is present.  6. The mitral valve is degenerative. Mild mitral valve regurgitation.  7. Tricuspid valve regurgitation is moderate.  8. The aortic valve is tricuspid. Aortic valve regurgitation is not  visualized. Mild to moderate aortic valve stenosis. Vmax 2.4 m/s, MG 12  mmHg, AVA 1.2 cm^2, DI 0.35  9. The inferior vena cava is dilated in size with <50% respiratory  variability, suggesting right atrial pressure of 15  mmHg.    Neuro/Psych negative neurological ROS  negative psych ROS   GI/Hepatic Neg liver ROS, GERD  ,  Endo/Other  negative endocrine ROS  Renal/GU negative Renal ROS  negative genitourinary   Musculoskeletal negative musculoskeletal ROS (+)   Abdominal   Peds  Hematology  (+) Blood dyscrasia, anemia , Lab Results      Component                Value               Date                      WBC                      4.3                 07/04/2021                HGB                      8.5 (L)             07/04/2021                HCT                      26.2 (L)            07/04/2021                MCV  93.2                07/04/2021                PLT                      130 (L)             07/04/2021              Anesthesia Other Findings   Reproductive/Obstetrics                            Anesthesia Physical Anesthesia Plan  ASA: 3  Anesthesia Plan: MAC   Post-op Pain Management:    Induction: Intravenous  PONV Risk Score and Plan: Propofol infusion and Treatment may vary due to age or medical condition  Airway Management Planned: Natural Airway  Additional Equipment:   Intra-op Plan:   Post-operative Plan:   Informed Consent: I have reviewed the patients History and Physical, chart, labs and discussed the procedure including the risks, benefits and alternatives for the proposed anesthesia with the patient or authorized representative who has indicated his/her understanding and acceptance.     Dental advisory given  Plan Discussed with: CRNA  Anesthesia Plan Comments:         Anesthesia Quick Evaluation

## 2021-07-04 NOTE — Anesthesia Postprocedure Evaluation (Signed)
Anesthesia Post Note  Patient: [name]  Procedure(s) Performed: ESOPHAGOGASTRODUODENOSCOPY (EGD) WITH PROPOFOL BIOPSY     Patient location during evaluation: PACU Anesthesia Type: MAC Level of consciousness: awake and alert Pain management: pain level controlled Vital Signs Assessment: post-procedure vital signs reviewed and stable Respiratory status: spontaneous breathing, nonlabored ventilation, respiratory function stable and patient connected to nasal cannula oxygen Cardiovascular status: stable and blood pressure returned to baseline Postop Assessment: no apparent nausea or vomiting Anesthetic complications: no   No notable events documented.  Last Vitals:  Vitals:   07/04/21 1220 07/04/21 1235  BP: (!) 105/47 (!) 116/52  Pulse: 61   Resp: 12 13  Temp:  (!) 36.3 C  SpO2: 97%     Last Pain:  Vitals:   07/04/21 1235  TempSrc:   PainSc: 0-No pain                 Zamiya Dillard L Irelynn Schermerhorn

## 2021-07-04 NOTE — Transfer of Care (Signed)
Immediate Anesthesia Transfer of Care Note  Patient: [name]  Procedure(s) Performed: ESOPHAGOGASTRODUODENOSCOPY (EGD) WITH PROPOFOL BIOPSY  Patient Location: PACU  Anesthesia Type:MAC  Level of Consciousness: drowsy and patient cooperative  Airway & Oxygen Therapy: Patient Spontanous Breathing and Patient connected to nasal cannula oxygen  Post-op Assessment: Report given to RN and Post -op Vital signs reviewed and stable  Post vital signs: Reviewed and stable  Last Vitals:  Vitals Value Taken Time  BP 112/51 07/04/21 1207  Temp    Pulse 60 07/04/21 1208  Resp 13 07/04/21 1208  SpO2 100 % 07/04/21 1208  Vitals shown include unvalidated device data.  Last Pain:  Vitals:   07/04/21 0952  TempSrc: Temporal  PainSc: 0-No pain      Patients Stated Pain Goal: 2 (54/65/68 1275)  Complications: No notable events documented.

## 2021-07-04 NOTE — Anesthesia Procedure Notes (Signed)
Procedure Name: MAC Date/Time: 07/04/2021 11:43 AM Performed by: Janene Harvey, CRNA Pre-anesthesia Checklist: Patient identified, Emergency Drugs available, Suction available and Patient being monitored Patient Re-evaluated:Patient Re-evaluated prior to induction Oxygen Delivery Method: Nasal cannula Induction Type: IV induction Placement Confirmation: positive ETCO2 Dental Injury: Teeth and Oropharynx as per pre-operative assessment

## 2021-07-04 NOTE — Plan of Care (Signed)
Problem: Elimination: Goal: Will not experience complications related to urinary retention Outcome: Completed/Met   Problem: Coping: Goal: Level of anxiety will decrease Outcome: Completed/Met   Problem: Nutrition: Goal: Adequate nutrition will be maintained Outcome: Completed/Met

## 2021-07-04 NOTE — Interval H&P Note (Signed)
History and Physical Interval Note:  07/04/2021 10:23 AM  [place]  has presented today for surgery, with the diagnosis of IDA.  The various methods of treatment have been discussed with the patient and family. After consideration of risks, benefits and other options for treatment, the patient has consented to  Procedure(s): ESOPHAGOGASTRODUODENOSCOPY (EGD) WITH PROPOFOL (N/A) as a surgical intervention.  The patient's history has been reviewed, patient examined, no change in status, stable for surgery.  I have reviewed the patient's chart and labs.  Questions were answered to the patient's satisfaction.     [place]

## 2021-07-04 NOTE — Progress Notes (Signed)
[place] for IV iron Indication: Fe anemia  Allergies  Allergen Reactions   Iodinated Contrast Media Hives, Itching and Rash    03/13/14: symptoms began within two hours of intrathecal injection (myelogram 03/11/14); on shoulders radiating down to stomach.  Relief with Benadryl.  Told patient she would need to pre-med with Benadryl in the future before receiving this contrast.  Brita Romp, RN   Naproxen Hives, Itching and Rash    Patient Measurements: Height: 5\' 3"  (160 cm) Weight: 70.8 kg (156 lb 1.6 oz) IBW/kg (Calculated) : 52.4   Labs: Recent Labs    07/02/21 1356 07/03/21 0226 07/04/21 0432  WBC 4.4 4.0 4.3  HGB 8.8* 8.0* 8.5*  HCT 28.8* 25.3* 26.2*  PLT 149* 128* 130*  CREATININE 0.93 0.87 1.04*  ALBUMIN 3.6 3.2*  --   PROT 6.2* 5.6*  --   AST 32 20  --   ALT 18 18  --   ALKPHOS 56 50  --   BILITOT 1.6* 0.6  --     Estimated Creatinine Clearance: 33.9 mL/min (A) (by C-G formula based on SCr of 1.04 mg/dL (H)).    Assessment: 25 YOF presenting with fatigue, Fe anemia with hemoglobin 8.8.  IBW 52.4kg, target Hgb 14.8, likely deficit >1g (deficit of 1155 mg)  Plan:  No issues with tolerating dose noted Will schedule 4 additional doses of 250 mg of IV ferrous gluconate  Thank you Anette Guarneri, PharmD  Tad Moore 07/04/2021,9:25 AM

## 2021-07-04 NOTE — Progress Notes (Signed)
I received patient's critical lab showing hemoglobin 8.6.  Patient is already in the hospital as of today.

## 2021-07-04 NOTE — Progress Notes (Signed)
PROGRESS NOTE        PATIENT DETAILS Name: [name] Age: 88 y.o. Sex: female Date of Birth: 1929/02/26 Admit Date: 07/02/2021 Admitting Physician Evalee Mutton Kristeen Mans, MD [id number], Fransisca Kaufmann, MD  Brief Narrative: Patient is a 88 y.o. female with history of HFpEF, PAF, sick sinus syndrome-s/p PPM in place, VTE-presenting with fatigue/exertional dyspnea/leg edema-due to a combination of HFpEF exacerbation and iron deficiency anemia.  Diuresed with significant improvement in symptoms-GI evaluation for iron deficient anemia in progress.    Subjective: She feels much better-hardly any leg edema.  Acknowledges improvement in shortness of breath and fatigue.  Objective: Vitals: Blood pressure (!) 158/61, pulse 65, temperature (!) 97.4 F (36.3 C), temperature source Temporal, resp. rate 15, height 5\' 3"  (1.6 m), weight 70.8 kg, SpO2 99 %.   Exam: Gen Exam:Alert awake-not in any distress HEENT:atraumatic, normocephalic Chest: B/L clear to auscultation anteriorly CVS:S1S2 regular Abdomen:soft non tender, non distended Extremities:no edema Neurology: Non focal Skin: no rash   Pertinent Labs/Radiology: CBC Latest Ref Rng & Units 07/04/2021 07/03/2021 07/02/2021  WBC 4.0 - 10.5 K/uL 4.3 4.0 4.4  Hemoglobin 12.0 - 15.0 g/dL 8.5(L) 8.0(L) 8.8(L)  Hematocrit 36.0 - 46.0 % 26.2(L) 25.3(L) 28.8(L)  Platelets 150 - 400 K/uL 130(L) 128(L) 149(L)    Lab Results  Component Value Date   NA 137 07/04/2021   K 4.1 07/04/2021   CL 106 07/04/2021   CO2 27 07/04/2021      Assessment/Plan: * Acute on chronic diastolic (congestive) heart failure ([place])- (present on admission) Volume status much better-now euvolemic-have transition to oral furosemide.  Echo with preserved EF (EF 60 to 65%)   Iron deficiency anemia- (present on admission) Suspicion for intermittent GI bleeding while on Eliquis.  Given IV iron a couple of days ago-CBC remained stable.  FOBT  negative.  GI evaluation in progress-EGD scheduled for today.  Eliquis remains on hold.     Essential hypertension, benign- (present on admission) BP stable-continue nadolol.  Follow BP trend and adjust medications accordingly.  Paroxysmal atrial fibrillation ([place])- (present on admission) Paced rhythm on telemetry-continue amiodarone, nadolol-Eliquis on hold for endoscopic evaluation.  Sick sinus syndrome ([place])- (present on admission) S/p PPM placement-monitor on telemetry.  VTE (venous thromboembolism)- (present on admission) Has a history of DVT several years ago-in both of her lower extremities-Eliquis on hold until GI evaluation is completed.  B12 deficiency- (present on admission) B12 levels appear appropriate-continue monthly supplementation in the outpatient setting.  GERD (gastroesophageal reflux disease)- (present on admission) Continue PPI  BMI Estimated body mass index is 27.65 kg/m as calculated from the following:   Height as of this encounter: 5\' 3"  (1.6 m).   Weight as of this encounter: 70.8 kg.    DVT Prophylaxis: SCD's Procedures: None Consults: GI Code Status:Full code  Family Communication: Son-Ronnie-[phone]-updated on 1/23   Disposition Plan: Status is: Inpatient  Remains inpatient appropriate because: HFpEF exacerbation-on IV Lasix-not yet back to baseline-GI evaluation in progress for possible endoscopic evaluation.  Not yet stable for discharge.   Diet: Diet Order             Diet NPO time specified  Diet effective midnight                     Antimicrobial agents: Anti-infectives (From admission, onward)    None  MEDICATIONS: Scheduled Meds:  [MAR Hold] amiodarone  200 mg Oral Daily   [MAR Hold] calcium-vitamin D  1 tablet Oral Q breakfast   [MAR Hold] furosemide  40 mg Oral Daily   [MAR Hold] latanoprost  1 drop Both Eyes QHS   [MAR Hold] multivitamin with minerals  1 tablet Oral q morning   [MAR Hold]  nadolol  60 mg Oral Daily   [MAR Hold] pantoprazole  40 mg Oral BID   Continuous Infusions:  [MAR Hold] ferric gluconate (FERRLECIT) IVPB     PRN Meds:.[MAR Hold] acetaminophen, [MAR Hold] melatonin, [MAR Hold] nitroGLYCERIN, [MAR Hold] ondansetron **OR** [MAR Hold] ondansetron (ZOFRAN) IV, [MAR Hold] traMADol   I have personally reviewed following labs and imaging studies  LABORATORY DATA: CBC: Recent Labs  Lab 06/28/21 1055 07/01/21 0909 07/02/21 1356 07/03/21 0226 07/04/21 0432  WBC 4.0 4.1 4.4 4.0 4.3  NEUTROABS 1.9 1.9 2.3  --   --   HGB 9.8* 8.6* 8.8* 8.0* 8.5*  HCT 29.9* 26.4* 28.8* 25.3* 26.2*  MCV 92 90 96.3 93.0 93.2  PLT 177 150 149* 128* 130*     Basic Metabolic Panel: Recent Labs  Lab 06/28/21 1055 07/02/21 1356 07/03/21 0226 07/04/21 0432  NA 138 138 139 137  K 4.7 4.3 3.9 4.1  CL 100 105 103 106  CO2 25 26 28 27   GLUCOSE 93 110* 108* 96  BUN 16 21 19 16   CREATININE 0.92 0.93 0.87 1.04*  CALCIUM 9.3 9.1 8.8* 8.5*     GFR: Estimated Creatinine Clearance: 33.9 mL/min (A) (by C-G formula based on SCr of 1.04 mg/dL (H)).  Liver Function Tests: Recent Labs  Lab 06/28/21 1055 07/02/21 1356 07/03/21 0226  AST 25 32 20  ALT 23 18 18   ALKPHOS 70 56 50  BILITOT 0.5 1.6* 0.6  PROT 6.4 6.2* 5.6*  ALBUMIN 4.2 3.6 3.2*    No results for input(s): LIPASE, AMYLASE in the last 168 hours. No results for input(s): AMMONIA in the last 168 hours.  Coagulation Profile: No results for input(s): INR, PROTIME in the last 168 hours.  Cardiac Enzymes: No results for input(s): CKTOTAL, CKMB, CKMBINDEX, TROPONINI in the last 168 hours.  BNP (last 3 results) No results for input(s): PROBNP in the last 8760 hours.  Lipid Profile: No results for input(s): CHOL, HDL, LDLCALC, TRIG, CHOLHDL, LDLDIRECT in the last 72 hours.  Thyroid Function Tests: No results for input(s): TSH, T4TOTAL, FREET4, T3FREE, THYROIDAB in the last 72 hours.  Anemia Panel: Recent  Labs    07/02/21 1524  VITAMINB12 542  FOLATE 30.4  FERRITIN 8*  TIBC 384  IRON 20*  RETICCTPCT 2.0     Urine analysis:    Component Value Date/Time   COLORURINE YELLOW 08/19/2015 [place] 08/19/2015 1222   LABSPEC 1.007 08/19/2015 1222   PHURINE 7.0 08/19/2015 1222   GLUCOSEU NEGATIVE 08/19/2015 1222   HGBUR NEGATIVE 08/19/2015 1222   BILIRUBINUR NEGATIVE 08/19/2015 1222   KETONESUR NEGATIVE 08/19/2015 1222   PROTEINUR NEGATIVE 08/19/2015 1222   UROBILINOGEN 0.2 02/03/2015 1518   NITRITE NEGATIVE 08/19/2015 1222   LEUKOCYTESUR NEGATIVE 08/19/2015 1222    Sepsis Labs: Lactic Acid, Venous No results found for: LATICACIDVEN  MICROBIOLOGY: Recent Results (from the past 240 hour(s))  Resp Panel by RT-PCR (Flu A&B, Covid) Nasopharyngeal Swab     Status: None   Collection Time: 07/02/21  4:35 PM   Specimen: Nasopharyngeal Swab; Nasopharyngeal(NP) swabs in vial transport medium  Result  Value Ref Range Status   SARS Coronavirus 2 by RT PCR NEGATIVE NEGATIVE Final    Comment: (NOTE) SARS-CoV-2 target nucleic acids are NOT DETECTED.  The SARS-CoV-2 RNA is generally detectable in upper respiratory specimens during the acute phase of infection. The lowest concentration of SARS-CoV-2 viral copies this assay can detect is 138 copies/mL. A negative result does not preclude SARS-Cov-2 infection and should not be used as the sole basis for treatment or other patient management decisions. A negative result may occur with  improper specimen collection/handling, submission of specimen other than nasopharyngeal swab, presence of viral mutation(s) within the areas targeted by this assay, and inadequate number of viral copies(<138 copies/mL). A negative result must be combined with clinical observations, patient history, and epidemiological information. The expected result is Negative.  Fact Sheet for Patients:  EntrepreneurPulse.com.au  Fact  Sheet for Healthcare Providers:  IncredibleEmployment.be  This test is no t yet approved or cleared by the [place] FDA and  has been authorized for detection and/or diagnosis of SARS-CoV-2 by FDA under an Emergency Use Authorization (EUA). This EUA will remain  in effect (meaning this test can be used) for the duration of the COVID-19 declaration under Section 564(b)(1) of the Act, 21 U.S.C.section 360bbb-3(b)(1), unless the authorization is terminated  or revoked sooner.       Influenza A by PCR NEGATIVE NEGATIVE Final   Influenza B by PCR NEGATIVE NEGATIVE Final    Comment: (NOTE) The Xpert Xpress SARS-CoV-2/FLU/RSV plus assay is intended as an aid in the diagnosis of influenza from Nasopharyngeal swab specimens and should not be used as a sole basis for treatment. Nasal washings and aspirates are unacceptable for Xpert Xpress SARS-CoV-2/FLU/RSV testing.  Fact Sheet for Patients: EntrepreneurPulse.com.au  Fact Sheet for Healthcare Providers: IncredibleEmployment.be  This test is not yet approved or cleared by the [place] FDA and has been authorized for detection and/or diagnosis of SARS-CoV-2 by FDA under an Emergency Use Authorization (EUA). This EUA will remain in effect (meaning this test can be used) for the duration of the COVID-19 declaration under Section 564(b)(1) of the Act, 21 U.S.C. section 360bbb-3(b)(1), unless the authorization is terminated or revoked.  Performed at [facility], [place] [address], [place], [place] [address]     RADIOLOGY STUDIES/RESULTS: CT ABDOMEN PELVIS WO CONTRAST  Result Date: 07/02/2021 CLINICAL DATA:  Abdominal pain, evaluate for retroperitoneal hematoma EXAM: CT ABDOMEN AND PELVIS WITHOUT CONTRAST TECHNIQUE: Multidetector CT imaging of the abdomen and pelvis was performed following the standard protocol without IV contrast. RADIATION DOSE REDUCTION: This exam was  performed according to the departmental dose-optimization program which includes automated exposure control, adjustment of the mA and/or kV according to patient size and/or use of iterative reconstruction technique. COMPARISON:  None. FINDINGS: Lower chest: Heart is enlarged in size. Pacer leads are noted. There are coarse calcifications in the mitral annulus. Hepatobiliary: No focal abnormality is seen in the liver. There is mild nodularity in the liver surface. Surgical clips are seen in gallbladder fossa. Pancreas: No focal abnormality is seen. Spleen: Unremarkable. Adrenals/Urinary Tract: Adrenals are unremarkable. There is no hydronephrosis. There are no renal or ureteral stones. Urinary bladder is not distended. There are few low-density lesions in the renal cortex on both sides largest measuring 2.6 cm in diameter in the lower pole of right kidney suggesting renal cysts. Stomach/Bowel: Small hiatal hernia is seen. Stomach is not distended. Small bowel loops are not dilated. Appendix is not dilated. There is no significant  wall thickening in colon. There is no pericolic stranding. Scattered diverticula are seen without signs of focal acute diverticulitis. Vascular/Lymphatic: There are small scattered arterial calcifications. There is no evidence of retroperitoneal hematoma. There scattered subcentimeter nodes in mesentery and retroperitoneum. Reproductive: Uterus is unremarkable. In image 60 of series 3, there is small area of fluid density in the right posterior pelvis which could not be definitely separated from a bowel loop suggesting possible fluid in the lumen of a bowel loop. There are no dominant adnexal masses. Other: There is no pneumoperitoneum. Small right inguinal hernia containing fat is seen. Musculoskeletal: Unremarkable. IMPRESSION: There is no evidence of intestinal obstruction or pneumoperitoneum. Appendix is not dilated. There is no hydronephrosis. There is no demonstrable retroperitoneal  hematoma. Scattered diverticula are seen in colon without signs of focal acute diverticulitis. There few smooth marginated low-density lesions in the kidneys suggesting renal cysts. There is mild nodularity in the liver surface which may be normal variation or suggest cirrhosis. Other findings as described in the body of the report. Electronically Signed   By: Elmer Picker M.D.   On: 07/02/2021 17:24   DG Chest 2 View  Result Date: 07/02/2021 CLINICAL DATA:  Shortness of breath and fatigue. EXAM: CHEST - 2 VIEW COMPARISON:  03/08/2021 and prior radiographs FINDINGS: Cardiomegaly and RIGHT-sided pacemaker again noted. There is no evidence of focal airspace disease, pulmonary edema, suspicious pulmonary nodule/mass, pleural effusion, or pneumothorax. No acute bony abnormalities are identified. IMPRESSION: Cardiomegaly without evidence of acute cardiopulmonary disease. Electronically Signed   By: Margarette Canada M.D.   On: 07/02/2021 14:43   ECHOCARDIOGRAM COMPLETE  Result Date: 07/03/2021    ECHOCARDIOGRAM REPORT   Patient Name:   ZAINAH STEVEN Gradillas Date of Exam: 07/03/2021 Medical Rec #:  [id number]           Height:       63.0 in Accession #:    [id number]          Weight:       157.8 lb Date of Birth:  1928-12-30           BSA:          1.748 m Patient Age:    48 years            BP:           133/56 mmHg Patient Gender: F                   HR:           62 bpm. Exam Location:  Inpatient Procedure: 2D Echo, Cardiac Doppler and Color Doppler Indications:    CHF-Acute Diastolic I96.78  History:        Patient has prior history of Echocardiogram examinations, most                 recent 11/18/2020. Risk Factors:Hypertension and Dyslipidemia.                 Chronic anticoagulation, Breast Cancer, DVT, lower                 extremity ([place]) (From Hx).  Sonographer:    Alvino Chapel RCS Referring Phys: [place]  1. Left ventricular ejection fraction, by estimation, is 60 to 65%. The left  ventricle has normal function. The left ventricle has no regional wall motion abnormalities. There is moderate left ventricular hypertrophy. Left ventricular diastolic parameters are indeterminate.  2. Right ventricular systolic  function is mildly reduced. The right ventricular size is mildly enlarged. Moderately increased right ventricular wall thickness. There is moderately elevated pulmonary artery systolic pressure. The estimated right ventricular systolic pressure is 61.9 mmHg.  3. Left atrial size was moderately dilated.  4. Right atrial size was moderately dilated.  5. A small pericardial effusion is present.  6. The mitral valve is degenerative. Mild mitral valve regurgitation.  7. Tricuspid valve regurgitation is moderate.  8. The aortic valve is tricuspid. Aortic valve regurgitation is not visualized. Mild to moderate aortic valve stenosis. Vmax 2.4 m/s, MG 12 mmHg, AVA 1.2 cm^2, DI 0.35  9. The inferior vena cava is dilated in size with <50% respiratory variability, suggesting right atrial pressure of 15 mmHg. FINDINGS  Left Ventricle: Left ventricular ejection fraction, by estimation, is 60 to 65%. The left ventricle has normal function. The left ventricle has no regional wall motion abnormalities. The left ventricular internal cavity size was normal in size. There is  moderate left ventricular hypertrophy. Left ventricular diastolic parameters are indeterminate. Right Ventricle: The right ventricular size is mildly enlarged. Moderately increased right ventricular wall thickness. Right ventricular systolic function is mildly reduced. There is moderately elevated pulmonary artery systolic pressure. The tricuspid regurgitant velocity is 3.11 m/s, and with an assumed right atrial pressure of 15 mmHg, the estimated right ventricular systolic pressure is 50.9 mmHg. Left Atrium: Left atrial size was moderately dilated. Right Atrium: Right atrial size was moderately dilated. Pericardium: A small pericardial  effusion is present. Mitral Valve: The mitral valve is degenerative in appearance. Mild mitral valve regurgitation. Tricuspid Valve: The tricuspid valve is normal in structure. Tricuspid valve regurgitation is moderate. Aortic Valve: The aortic valve is tricuspid. Aortic valve regurgitation is not visualized. Mild to moderate aortic stenosis is present. Aortic valve mean gradient measures 12.0 mmHg. Aortic valve peak gradient measures 22.8 mmHg. Aortic valve area, by VTI measures 1.24 cm. Pulmonic Valve: The pulmonic valve was not well visualized. Pulmonic valve regurgitation is mild. Aorta: The aortic root is normal in size and structure. Venous: The inferior vena cava is dilated in size with less than 50% respiratory variability, suggesting right atrial pressure of 15 mmHg. IAS/Shunts: The interatrial septum was not well visualized.  LEFT VENTRICLE PLAX 2D LVIDd:         4.70 cm LVIDs:         2.90 cm LV PW:         1.40 cm LV IVS:        1.30 cm LVOT diam:     2.10 cm LV SV:         67 LV SV Index:   38 LVOT Area:     3.46 cm  RIGHT VENTRICLE RV S prime:     7.54 cm/s TAPSE (M-mode): 1.7 cm LEFT ATRIUM             Index        RIGHT ATRIUM           Index LA diam:        4.60 cm 2.63 cm/m   RA Area:     23.60 cm LA Vol (A2C):   65.6 ml 37.52 ml/m  RA Volume:   80.70 ml  46.16 ml/m LA Vol (A4C):   73.2 ml 41.87 ml/m LA Biplane Vol: 73.5 ml 42.04 ml/m  AORTIC VALVE AV Area (Vmax):    1.29 cm AV Area (Vmean):   1.17 cm AV Area (VTI):     1.24  cm AV Vmax:           239.00 cm/s AV Vmean:          160.000 cm/s AV VTI:            0.540 m AV Peak Grad:      22.8 mmHg AV Mean Grad:      12.0 mmHg LVOT Vmax:         88.80 cm/s LVOT Vmean:        54.000 cm/s LVOT VTI:          0.193 m LVOT/AV VTI ratio: 0.36  AORTA Ao Root diam: 3.50 cm MITRAL VALVE                  TRICUSPID VALVE MV Area (PHT): 5.02 cm       TR Peak grad:   38.7 mmHg MV Decel Time: 151 msec       TR Vmax:        311.00 cm/s MR Peak grad:     60.2 mmHg MR Mean grad:    42.0 mmHg    SHUNTS MR Vmax:         388.00 cm/s  Systemic VTI:  0.19 m MR Vmean:        305.0 cm/s   Systemic Diam: 2.10 cm MR PISA:         1.01 cm MR PISA Eff ROA: 6 mm MR PISA Radius:  0.40 cm MV E velocity: 104.00 cm/s Oswaldo Milian MD Electronically signed by Oswaldo Milian MD Signature Date/Time: 07/03/2021/5:40:25 PM    Final      LOS: 2 days   Oren Binet, MD  [facility]    To contact the attending provider between 7A-7P or the covering provider during after hours 7P-7A, please log into the web site [email or web address] and access using universal [place] password for that web site. If you do not have the password, please call the hospital operator.  07/04/2021, 11:36 AM

## 2021-07-04 NOTE — Op Note (Signed)
[facility] Patient Name: [name] Procedure Date : 07/04/2021 MRN: [id number] Attending MD: Thornton Park MD, MD Date of Birth: [birth year] CSN: [id number] Age: 88 Admit Type: Inpatient Procedure:                Upper GI endoscopy Indications:              Unexplained iron deficiency anemia, possible                            melena, no occult blood in the stool Providers:                Thornton Park MD, MD, Glori Bickers, RN, Cherylynn Ridges, Technician, Reather Laurence, CRNA Referring MD:              Medicines:                Monitored Anesthesia Care Complications:            No immediate complications. Estimated blood loss:                            Minimal. Estimated Blood Loss:     Estimated blood loss was minimal. Procedure:                Pre-Anesthesia Assessment:                           - Prior to the procedure, a History and Physical                            was performed, and patient medications and                            allergies were reviewed. The patient's tolerance of                            previous anesthesia was also reviewed. The risks                            and benefits of the procedure and the sedation                            options and risks were discussed with the patient.                            All questions were answered, and informed consent                            was obtained. Prior Anticoagulants: The patient has                            taken Eliquis (apixaban), last dose was 1 day prior  to procedure. ASA Grade Assessment: III - A patient                            with severe systemic disease. After reviewing the                            risks and benefits, the patient was deemed in                            satisfactory condition to undergo the procedure.                           After obtaining informed consent, the endoscope was                             passed under direct vision. Throughout the                            procedure, the patient's blood pressure, pulse, and                            oxygen saturations were monitored continuously. The                            [id number]-[id number] ([id number]) Olympus endoscope was introduced                            through the mouth, and advanced to the third part                            of duodenum. The upper GI endoscopy was                            accomplished without difficulty. The patient                            tolerated the procedure well. Scope In: Scope Out: Findings:      The examined esophagus was normal. No esophageal varices.      A small hiatal hernia was present. No cameron's erosions.      There was a mild mucosa prominence in the distal gastric body. This was       not an obvious nodule. Tunneled biopsies were taken with a cold forceps       for histology. Estimated blood loss was minimal.      The remainder of the entired examined stomach was normal. No gastritis,       portal hypertensive gastropathy, or gastric varices. Biopsies were taken       from the antrum, body, and fundus with a cold forceps for histology.       Estimated blood loss was minimal.      The examined duodenum was normal. Biopsies were taken with a cold       forceps for histology. Estimated blood loss was minimal.      The cardia and gastric fundus were normal on retroflexion.  The exam was otherwise without abnormality. Impression:               - Normal esophagus.                           - Small hiatal hernia.                           - Subtle mucosal abnormality in the gastric body.                            Unlikely to be clinically significant. Biopsied.                           - Normal examined duodenum. Biopsied.                           - No obvious source for anemia identified on this                            study. Recommendation:           - Patient  has a contact number available for                            emergencies. The signs and symptoms of potential                            delayed complications were discussed with the                            patient. Return to normal activities tomorrow.                            Written discharge instructions were provided to the                            patient.                           - Resume previous diet.                           - Continue present medications.                           - Await pathology results.                           - Could consider colonoscopy if the benefits                            outweight the risks.                           - Consider evaluation for other causes of anemia.  The results and recommendations were discussed with                            the patient's son, Gwyndolyn Guilford, who asked to                            review consideration of colonoscopy with his Mom                            and his brothers. I also updated Dr. Sloan Leiter with                            the hospitalist team. I have tentatively scheduled                            a colonoscopy for tomorrow after these discussions,                            however, this can always be cancelled if the                            patient or her family decides the risks outweigh                            the benefits. Procedure Code(s):        --- Professional ---                           [phone], Esophagogastroduodenoscopy, flexible,                            transoral; with biopsy, single or multiple Diagnosis Code(s):        --- Professional ---                           K44.9, Diaphragmatic hernia without obstruction or                            gangrene                           K31.89, Other diseases of stomach and duodenum                           D50.9, Iron deficiency anemia, unspecified CPT copyright 2019 American [employer] Association.  All rights reserved. The codes documented in this report are preliminary and upon coder review may  be revised to meet current compliance requirements. Thornton Park MD, MD 07/04/2021 12:39:36 PM This report has been signed electronically. Number of Addenda: 0

## 2021-07-05 ENCOUNTER — Encounter (HOSPITAL_COMMUNITY): Payer: Self-pay | Admitting: Gastroenterology

## 2021-07-05 ENCOUNTER — Telehealth: Payer: Self-pay | Admitting: Family Medicine

## 2021-07-05 ENCOUNTER — Other Ambulatory Visit (HOSPITAL_COMMUNITY): Payer: Self-pay

## 2021-07-05 DIAGNOSIS — D649 Anemia, unspecified: Secondary | ICD-10-CM

## 2021-07-05 DIAGNOSIS — K3189 Other diseases of stomach and duodenum: Secondary | ICD-10-CM

## 2021-07-05 DIAGNOSIS — E538 Deficiency of other specified B group vitamins: Secondary | ICD-10-CM

## 2021-07-05 LAB — CBC
HCT: 27.4 % — ABNORMAL LOW (ref 36.0–46.0)
Hemoglobin: 8.8 g/dL — ABNORMAL LOW (ref 12.0–15.0)
MCH: 29.5 pg (ref 26.0–34.0)
MCHC: 32.1 g/dL (ref 30.0–36.0)
MCV: 91.9 fL (ref 80.0–100.0)
Platelets: 145 10*3/uL — ABNORMAL LOW (ref 150–400)
RBC: 2.98 MIL/uL — ABNORMAL LOW (ref 3.87–5.11)
RDW: 14.7 % (ref 11.5–15.5)
WBC: 4.5 10*3/uL (ref 4.0–10.5)
nRBC: 0 % (ref 0.0–0.2)

## 2021-07-05 SURGERY — COLONOSCOPY WITH PROPOFOL
Anesthesia: Monitor Anesthesia Care

## 2021-07-05 MED ORDER — POTASSIUM CHLORIDE CRYS ER 20 MEQ PO TBCR
20.0000 meq | EXTENDED_RELEASE_TABLET | Freq: Every day | ORAL | 1 refills | Status: DC
  Filled 2021-07-05: qty 30, 30d supply, fill #0

## 2021-07-05 MED ORDER — FUROSEMIDE 20 MG PO TABS
40.0000 mg | ORAL_TABLET | Freq: Every day | ORAL | 1 refills | Status: DC
  Filled 2021-07-05: qty 60, 30d supply, fill #0

## 2021-07-05 MED ORDER — FERROUS SULFATE 325 (65 FE) MG PO TABS
325.0000 mg | ORAL_TABLET | Freq: Two times a day (BID) | ORAL | 3 refills | Status: DC
  Filled 2021-07-05: qty 60, 30d supply, fill #0

## 2021-07-05 NOTE — Progress Notes (Signed)
Pt discharged home. IV and tele removed. Taken out via wheelchair.

## 2021-07-05 NOTE — Progress Notes (Signed)
Heart Failure Nurse Navigator Progress Note  Pt declined HV TOC clinic appt upon DC from hospitalization. Pt educated on benefits of HV TOC. Pt education complete regarding HF patient booklet.   Pricilla Holm, MSN, RN Heart Failure Nurse Navigator [phone]

## 2021-07-05 NOTE — Telephone Encounter (Signed)
Hospital called to schedule patient a hospital follow up. Triage unavailable at the time of call. Please call patients son to schedule hospital follow up.

## 2021-07-05 NOTE — Telephone Encounter (Signed)
I think this may be one of your TOC calls, if not please let me know and I will contact them.

## 2021-07-05 NOTE — TOC Transition Note (Addendum)
Transition of Care [facility]) - CM/SW Discharge Note   Patient Details  Name: [name] MRN: [id number] Date of Birth: 21-Feb-1929  [facility] CM/SW Contact:  [name], RN Phone Number: 07/05/2021, 11:25 AM   Clinical Narrative:    NCM spoke with patient and son at bedside, she lives alone, she has a walk in shower, rails in the hall way, rolling walker and  a cane at home.  She states she still drives also.  She has someone that comes to clean her house once a week.  Son is her support system he is retired.  NCM offered choice for [facility] she states she has had [name] in the past, can try them.  NCM made referral to [facility] with [facility] .  Awaiting to hear back. Per Corene Cornea , he is able to take referral for [facility] soc will begin 24 to 48 hrs post dc.    Final next level of care: [place] Barriers to Discharge: No Barriers Identified   Patient Goals and CMS Choice Patient states their goals for this hospitalization and ongoing recovery are:: home with [facility] CMS Medicare.gov Compare Post Acute Care list provided to:: Patient Choice offered to / list presented to : Patient  Discharge Placement                       Discharge Plan and Services                  DME Agency: NA       HH Arranged: Disease Management, RN [place] Agency: [place] ([place]) Date HH Agency Contacted: 07/05/21 Time [place]: [id number] Representative spoke with at [place]: [place] ([place]) Interventions     Readmission Risk Interventions No flowsheet data found.

## 2021-07-05 NOTE — Progress Notes (Signed)
Mobility Specialist Progress Note:   07/05/21 1310  Mobility  Activity Ambulated with assistance in room  Level of Assistance Modified independent, requires aide device or extra time  Assistive Device  (IV pole)  Distance Ambulated (ft) 28 ft  Activity Response Tolerated well  $Mobility charge 1 Mobility   Pt received EOB asking to go to bathroom. Ambulated to bathroom and then back to EOB. Left EOB with call bell in reach and all needs met.  [facility] Public librarian Phone [phone] Secondary Phone [phone]

## 2021-07-05 NOTE — Discharge Summary (Signed)
PATIENT DETAILS Name: [name] Age: 88 y.o. Sex: female Date of Birth: Jan 13, 1929 MRN: [id number]. Admitting Physician: Jonetta Osgood, MD LEX:[id number], Fransisca Kaufmann, MD  Admit Date: 07/02/2021 Discharge date: 07/05/2021  Recommendations for Outpatient Follow-up:  Follow up with PCP in 1-2 weeks Please obtain CMP/CBC in one week See discussion regarding Eliquis below.  Admitted From:  Home  Disposition: [place]: No  Equipment/Devices: None  Discharge Condition: Stable  CODE STATUS: FULL CODE  Diet recommendation:  Diet Order             Diet - low sodium heart healthy           Diet Heart Room service appropriate? Yes; Fluid consistency: Thin  Diet effective now                    Brief Summary: Patient is a 88 y.o. female with history of HFpEF, PAF, sick sinus syndrome-s/p PPM in place, VTE-presenting with fatigue/exertional dyspnea/leg edema-due to a combination of HFpEF exacerbation and iron deficiency anemia.  Diuresed with significant improvement in symptoms.  See below for further details.  Brief Hospital Course: * Acute on chronic diastolic (congestive) heart failure ([place])- (present on admission) Volume status much better-now euvolemic-have transitioned to oral furosemide.  Echo with preserved EF (EF 60 to 65%).  Follow weights/electrolytes periodically.  I have counseled regarding importance of fluid restriction.  Her presenting complaints of fatigue/shortness of breath was mostly from CHF-as she has improved remarkably with diuresis.  Some component of fatigue/shortness of breath may be from iron deficiency anemia as well.  Iron deficiency anemia- (present on admission) Suspicion for intermittent recent GI bleeding while on Eliquis.  FOBT was negative.  Given IV iron x2 during this hospitalization-hemoglobin now slowly improving.  EGD without any bleeding source.  Colonoscopy contemplated-but family/patient elected to hold off  for now.  Plan is to restart/challenge with resumption of anticoagulation with Eliquis (see discussion below) and follow clinically to see if patient has overt bleeding or if hemoglobin decreases.  Per patient/family-colonoscopy can be considered if GI bleeding occurs/hemoglobin continues to drop.    Essential hypertension, benign- (present on admission) BP stable-continue nadolol.  Follow BP trend and adjust medications accordingly.  Paroxysmal atrial fibrillation ([place])- (present on admission) Paced rhythm on telemetry-on amiodarone and nadolol.  Eliquis held on admission.  Plans were to complete GI work-up with EGD/colonoscopy before resuming Eliquis.  However family/patient wants to hold off on colonoscopy.  Long discussion with patient-and subsequently with her son [name] over the phone-different options discussed including permanently stopping anticoagulation versus resuming Eliquis-and following CBC/clinically to see if bleeding occurs.  Both patient/family elected to continue Eliquis on discharge-all aware of the need to monitor for GI bleeding and to seek immediate hospitalization if this were to happen.  Family would want to pursue colonoscopy if in the event GI bleeding occurs or if hemoglobin continues to drop.  Sick sinus syndrome ([place])- (present on admission) S/p PPM placement  Gastric nodule Seen on EGD on 1/23-follow biopsy results-defer to gastroenterology  VTE (venous thromboembolism)- (present on admission) Has a history of DVT several years ago-in both of her lower extremities-see above regarding anticoagulation.  B12 deficiency- (present on admission) B12 levels appear appropriate-continue monthly supplementation in the outpatient setting.  GERD (gastroesophageal reflux disease)- (present on admission) Continue PPI  BMI: Estimated body mass index is 27.65 kg/m as calculated from the following:   Height as of this encounter: 5\' 3"  (1.6  m).   Weight as of this  encounter: 70.8 kg.     Procedures 1/23>>EGD   Discharge Diagnoses:  Principal Problem:   Acute on chronic diastolic (congestive) heart failure (HCC) Active Problems:   Iron deficiency anemia   Essential hypertension, benign   Paroxysmal atrial fibrillation (HCC)   Sick sinus syndrome (HCC)   GERD (gastroesophageal reflux disease)   B12 deficiency   VTE (venous thromboembolism)   Gastric nodule   Discharge Instructions:  Activity:  As tolerated with Full fall precautions use walker/cane & assistance as needed   Discharge Instructions     (HEART FAILURE PATIENTS) Call MD:  Anytime you have any of the following symptoms: 1) 3 pound weight gain in 24 hours or 5 pounds in 1 week 2) shortness of breath, with or without a dry hacking cough 3) swelling in the hands, feet or stomach 4) if you have to sleep on extra pillows at night in order to breathe.   Complete by: As directed    Call MD for:  difficulty breathing, headache or visual disturbances   Complete by: As directed    Diet - low sodium heart healthy   Complete by: As directed    1500 cc/day   Discharge instructions   Complete by: As directed    Follow with Primary MD  Dettinger, Fransisca Kaufmann, MD in 1-2 weeks  Follow-up with [facility] as instructed.  Your gastric/endoscopy biopsy results are pending-this will need to be followed  Please get a complete blood count and chemistry panel checked by your Primary MD at your next visit, and again as instructed by your Primary MD.  Get Medicines reviewed and adjusted: Please take all your medications with you for your next visit with your Primary MD  Laboratory/radiological data: Please request your Primary MD to go over all hospital tests and procedure/radiological results at the follow up, please ask your Primary MD to get all Hospital records sent to his/her office.  In some cases, they will be blood work, cultures and biopsy results pending at the time of  your discharge. Please request that your primary care M.D. follows up on these results.  Also Note the following: If you experience worsening of your admission symptoms, develop shortness of breath, life threatening emergency, suicidal or homicidal thoughts you must seek medical attention immediately by calling 911 or calling your MD immediately  if symptoms less severe.  You must read complete instructions/literature along with all the possible adverse reactions/side effects for all the Medicines you take and that have been prescribed to you. Take any new Medicines after you have completely understood and accpet all the possible adverse reactions/side effects.   Do not drive when taking Pain medications or sleeping medications (Benzodaizepines)  Do not take more than prescribed Pain, Sleep and Anxiety Medications. It is not advisable to combine anxiety,sleep and pain medications without talking with your primary care practitioner  Special Instructions: If you have smoked or chewed Tobacco  in the last 2 yrs please stop smoking, stop any regular Alcohol  and or any Recreational drug use.  Wear Seat belts while driving.  Please note: You were cared for by a hospitalist during your hospital stay. Once you are discharged, your primary care physician will handle any further medical issues. Please note that NO REFILLS for any discharge medications will be authorized once you are discharged, as it is imperative that you return to your primary care physician (or establish a relationship with a primary  care physician if you do not have one) for your post hospital discharge [name] so that they can reassess your need for medications and monitor your lab values.   Increase activity slowly   Complete by: As directed       Allergies as of 07/05/2021       Reactions   Iodinated Contrast Media Hives, Itching, Rash   03/13/14: symptoms began within two hours of intrathecal injection (myelogram 03/11/14); on  shoulders radiating down to stomach.  Relief with Benadryl.  Told patient she would need to pre-med with Benadryl in the future before receiving this contrast.  Brita Romp, RN   Naproxen Hives, Itching, Rash        Medication List     STOP taking these medications    traMADol 50 MG tablet Commonly known as: ULTRAM       TAKE these medications    acetaminophen 500 MG tablet Commonly known as: TYLENOL Take 1,000 mg by mouth every 6 (six) hours as needed for mild pain.   amiodarone 200 MG tablet Commonly known as: PACERONE TAKE ONE (1) TABLET EACH DAY What changed: See the new instructions.   apixaban 5 MG Tabs tablet Commonly known as: Eliquis Take 1 tablet (5 mg total) by mouth 2 (two) times daily.   Benefiber Powd Take 1 Dose by mouth daily as needed (constipation).   calcium-vitamin D 500-200 MG-UNIT tablet Commonly known as: OSCAL WITH D Take 1 tablet by mouth daily with breakfast.   cyanocobalamin 1000 MCG/ML injection Commonly known as: (VITAMIN B-12) Inject 1 mL (1,000 mcg total) into the muscle every 30 (thirty) days.   ferrous sulfate 325 (65 FE) MG EC tablet Take 1 tablet (325 mg total) by mouth 2 (two) times daily.   furosemide 20 MG tablet Commonly known as: LASIX Take 2 tablets (40 mg total) by mouth daily. What changed: how much to take   latanoprost 0.005 % ophthalmic solution Commonly known as: XALATAN Place 1 drop into both eyes at bedtime.   melatonin 5 MG Tabs Take 5 mg by mouth at bedtime as needed (sleep).   multivitamin tablet Take 1 tablet by mouth every morning.   nadolol 40 MG tablet Commonly known as: CORGARD Take 1.5 tablets (60 mg total) by mouth daily.   nitroGLYCERIN 0.4 MG SL tablet Commonly known as: NITROSTAT DISSOLVE 1 TAB UNDER TOUNGE FOR CHEST PAIN. MAY REPEAT EVERY 5 MINUTES FOR 3 DOSES. IF NO RELIEF CALL 911 OR GO TO ER What changed: See the new instructions.   ondansetron 4 MG disintegrating tablet Commonly  known as: ZOFRAN-ODT TAKE 1 TABLET BY MOUTH EVERY 8 HOURS AS NEEDED FOR NAUSEA & VOMITING What changed: reasons to take this   pantoprazole 40 MG tablet Commonly known as: PROTONIX TAKE ONE (1) TABLET EACH DAY What changed: See the new instructions.   potassium chloride SA 20 MEQ tablet Commonly known as: KLOR-CON M Take 1 tablet (20 mEq total) by mouth daily.        Follow-up Information     Dettinger, Fransisca Kaufmann, MD. Schedule an appointment as soon as possible for a visit in 1 week(s).   Specialties: Family Medicine, Cardiology Contact information: [place] [address] [phone]         Satira Sark, MD Follow up in 1 month(s).   Specialty: Cardiology Contact information: [place] [address] [phone]         Thompson Grayer, MD  Follow up.   Specialty: Cardiology Why: As needed Contact information: [address] [place] [address] [phone]                Allergies  Allergen Reactions   Iodinated Contrast Media Hives, Itching and Rash    03/13/14: symptoms began within two hours of intrathecal injection (myelogram 03/11/14); on shoulders radiating down to stomach.  Relief with Benadryl.  Told patient she would need to pre-med with Benadryl in the future before receiving this contrast.  Brita Romp, RN   Naproxen Hives, Itching and Rash     Consultations:  GI   Other Procedures/Studies: CT ABDOMEN PELVIS WO CONTRAST  Result Date: 07/02/2021 CLINICAL DATA:  Abdominal pain, evaluate for retroperitoneal hematoma EXAM: CT ABDOMEN AND PELVIS WITHOUT CONTRAST TECHNIQUE: Multidetector CT imaging of the abdomen and pelvis was performed following the standard protocol without IV contrast. RADIATION DOSE REDUCTION: This exam was performed according to the departmental dose-optimization program which includes automated exposure control, adjustment of the mA and/or kV according to patient size and/or use  of iterative reconstruction technique. COMPARISON:  None. FINDINGS: Lower chest: Heart is enlarged in size. Pacer leads are noted. There are coarse calcifications in the mitral annulus. Hepatobiliary: No focal abnormality is seen in the liver. There is mild nodularity in the liver surface. Surgical clips are seen in gallbladder fossa. Pancreas: No focal abnormality is seen. Spleen: Unremarkable. Adrenals/Urinary Tract: Adrenals are unremarkable. There is no hydronephrosis. There are no renal or ureteral stones. Urinary bladder is not distended. There are few low-density lesions in the renal cortex on both sides largest measuring 2.6 cm in diameter in the lower pole of right kidney suggesting renal cysts. Stomach/Bowel: Small hiatal hernia is seen. Stomach is not distended. Small bowel loops are not dilated. Appendix is not dilated. There is no significant wall thickening in colon. There is no pericolic stranding. Scattered diverticula are seen without signs of focal acute diverticulitis. Vascular/Lymphatic: There are small scattered arterial calcifications. There is no evidence of retroperitoneal hematoma. There scattered subcentimeter nodes in mesentery and retroperitoneum. Reproductive: Uterus is unremarkable. In image 60 of series 3, there is small area of fluid density in the right posterior pelvis which could not be definitely separated from a bowel loop suggesting possible fluid in the lumen of a bowel loop. There are no dominant adnexal masses. Other: There is no pneumoperitoneum. Small right inguinal hernia containing fat is seen. Musculoskeletal: Unremarkable. IMPRESSION: There is no evidence of intestinal obstruction or pneumoperitoneum. Appendix is not dilated. There is no hydronephrosis. There is no demonstrable retroperitoneal hematoma. Scattered diverticula are seen in colon without signs of focal acute diverticulitis. There few smooth marginated low-density lesions in the kidneys suggesting renal  cysts. There is mild nodularity in the liver surface which may be normal variation or suggest cirrhosis. Other findings as described in the body of the report. Electronically Signed   By: Elmer Picker M.D.   On: 07/02/2021 17:24   DG Chest 2 View  Result Date: 07/02/2021 CLINICAL DATA:  Shortness of breath and fatigue. EXAM: CHEST - 2 VIEW COMPARISON:  03/08/2021 and prior radiographs FINDINGS: Cardiomegaly and RIGHT-sided pacemaker again noted. There is no evidence of focal airspace disease, pulmonary edema, suspicious pulmonary nodule/mass, pleural effusion, or pneumothorax. No acute bony abnormalities are identified. IMPRESSION: Cardiomegaly without evidence of acute cardiopulmonary disease. Electronically Signed   By: Margarette Canada M.D.   On: 07/02/2021 14:43   ECHOCARDIOGRAM COMPLETE  Result Date: 07/03/2021  ECHOCARDIOGRAM REPORT   Patient Name:   [name] Stella Date of Exam: 07/03/2021 Medical Rec #:  [id number]           Height:       63.0 in Accession #:    [id number]          Weight:       157.8 lb Date of Birth:  1928-08-19           BSA:          1.748 m Patient Age:    28 years            BP:           133/56 mmHg Patient Gender: F                   HR:           62 bpm. Exam Location:  Inpatient Procedure: 2D Echo, Cardiac Doppler and Color Doppler Indications:    CHF-Acute Diastolic H60.73  History:        Patient has prior history of Echocardiogram examinations, most                 recent 11/18/2020. Risk Factors:Hypertension and Dyslipidemia.                 Chronic anticoagulation, Breast Cancer, DVT, lower                 extremity ([place]) (From Hx).  Sonographer:    Alvino Chapel RCS Referring Phys: [place]  1. Left ventricular ejection fraction, by estimation, is 60 to 65%. The left ventricle has normal function. The left ventricle has no regional wall motion abnormalities. There is moderate left ventricular hypertrophy. Left ventricular diastolic  parameters are indeterminate.  2. Right ventricular systolic function is mildly reduced. The right ventricular size is mildly enlarged. Moderately increased right ventricular wall thickness. There is moderately elevated pulmonary artery systolic pressure. The estimated right ventricular systolic pressure is 71.0 mmHg.  3. Left atrial size was moderately dilated.  4. Right atrial size was moderately dilated.  5. A small pericardial effusion is present.  6. The mitral valve is degenerative. Mild mitral valve regurgitation.  7. Tricuspid valve regurgitation is moderate.  8. The aortic valve is tricuspid. Aortic valve regurgitation is not visualized. Mild to moderate aortic valve stenosis. Vmax 2.4 m/s, MG 12 mmHg, AVA 1.2 cm^2, DI 0.35  9. The inferior vena cava is dilated in size with <50% respiratory variability, suggesting right atrial pressure of 15 mmHg. FINDINGS  Left Ventricle: Left ventricular ejection fraction, by estimation, is 60 to 65%. The left ventricle has normal function. The left ventricle has no regional wall motion abnormalities. The left ventricular internal cavity size was normal in size. There is  moderate left ventricular hypertrophy. Left ventricular diastolic parameters are indeterminate. Right Ventricle: The right ventricular size is mildly enlarged. Moderately increased right ventricular wall thickness. Right ventricular systolic function is mildly reduced. There is moderately elevated pulmonary artery systolic pressure. The tricuspid regurgitant velocity is 3.11 m/s, and with an assumed right atrial pressure of 15 mmHg, the estimated right ventricular systolic pressure is 62.6 mmHg. Left Atrium: Left atrial size was moderately dilated. Right Atrium: Right atrial size was moderately dilated. Pericardium: A small pericardial effusion is present. Mitral Valve: The mitral valve is degenerative in appearance. Mild mitral valve regurgitation. Tricuspid Valve: The tricuspid valve is normal in  structure. Tricuspid valve regurgitation is  moderate. Aortic Valve: The aortic valve is tricuspid. Aortic valve regurgitation is not visualized. Mild to moderate aortic stenosis is present. Aortic valve mean gradient measures 12.0 mmHg. Aortic valve peak gradient measures 22.8 mmHg. Aortic valve area, by VTI measures 1.24 cm. Pulmonic Valve: The pulmonic valve was not well visualized. Pulmonic valve regurgitation is mild. Aorta: The aortic root is normal in size and structure. Venous: The inferior vena cava is dilated in size with less than 50% respiratory variability, suggesting right atrial pressure of 15 mmHg. IAS/Shunts: The interatrial septum was not well visualized.  LEFT VENTRICLE PLAX 2D LVIDd:         4.70 cm LVIDs:         2.90 cm LV PW:         1.40 cm LV IVS:        1.30 cm LVOT diam:     2.10 cm LV SV:         67 LV SV Index:   38 LVOT Area:     3.46 cm  RIGHT VENTRICLE RV S prime:     7.54 cm/s TAPSE (M-mode): 1.7 cm LEFT ATRIUM             Index        RIGHT ATRIUM           Index LA diam:        4.60 cm 2.63 cm/m   RA Area:     23.60 cm LA Vol (A2C):   65.6 ml 37.52 ml/m  RA Volume:   80.70 ml  46.16 ml/m LA Vol (A4C):   73.2 ml 41.87 ml/m LA Biplane Vol: 73.5 ml 42.04 ml/m  AORTIC VALVE AV Area (Vmax):    1.29 cm AV Area (Vmean):   1.17 cm AV Area (VTI):     1.24 cm AV Vmax:           239.00 cm/s AV Vmean:          160.000 cm/s AV VTI:            0.540 m AV Peak Grad:      22.8 mmHg AV Mean Grad:      12.0 mmHg LVOT Vmax:         88.80 cm/s LVOT Vmean:        54.000 cm/s LVOT VTI:          0.193 m LVOT/AV VTI ratio: 0.36  AORTA Ao Root diam: 3.50 cm MITRAL VALVE                  TRICUSPID VALVE MV Area (PHT): 5.02 cm       TR Peak grad:   38.7 mmHg MV Decel Time: 151 msec       TR Vmax:        311.00 cm/s MR Peak grad:    60.2 mmHg MR Mean grad:    42.0 mmHg    SHUNTS MR Vmax:         388.00 cm/s  Systemic VTI:  0.19 m MR Vmean:        305.0 cm/s   Systemic Diam: 2.10 cm MR PISA:          1.01 cm MR PISA Eff ROA: 6 mm MR PISA Radius:  0.40 cm MV E velocity: 104.00 cm/s Oswaldo Milian MD Electronically signed by Oswaldo Milian MD Signature Date/Time: 07/03/2021/5:40:25 PM    Final    CUP PACEART REMOTE DEVICE CHECK  Result Date: 07/03/2021  Scheduled remote reviewed. Normal device function.  The device estimates 1 month until ERI Next remote 08/02/2021. Kathy Breach, RN, CCDS, [employer]    TODAY-DAY OF DISCHARGE:  Subjective:   [name] today has no headache,no chest abdominal pain,no new weakness tingling or numbness, feels much better wants to go home today.   Objective:   Blood pressure (!) 121/55, pulse 60, temperature 98.2 F (36.8 C), temperature source Oral, resp. rate 14, height 5\' 3"  (1.6 m), weight 70.8 kg, SpO2 100 %.  Intake/Output Summary (Last 24 hours) at 07/05/2021 1102 Last data filed at 07/05/2021 1022 Gross per 24 hour  Intake 2160 ml  Output 950 ml  Net 1210 ml   Filed Weights   07/02/21 2233 07/03/21 0047 07/04/21 0557  Weight: 72 kg 71.6 kg 70.8 kg    Exam: Awake Alert, Oriented *3, No new F.N deficits, Normal affect [place].AT,PERRAL Supple Neck,No JVD, No cervical lymphadenopathy appriciated.  Symmetrical Chest wall movement, Good air movement bilaterally, CTAB RRR,No Gallops,Rubs or new Murmurs, No Parasternal Heave +ve B.Sounds, Abd Soft, Non tender, No organomegaly appriciated, No rebound -guarding or rigidity. No Cyanosis, Clubbing or edema, No new Rash or bruise   PERTINENT RADIOLOGIC STUDIES: ECHOCARDIOGRAM COMPLETE  Result Date: 07/03/2021    ECHOCARDIOGRAM REPORT   Patient Name:   [name] Date of Exam: 07/03/2021 Medical Rec #:  [id number]           Height:       63.0 in Accession #:    [id number]          Weight:       157.8 lb Date of Birth:  [birth year]           BSA:          1.748 m Patient Age:    17 years            BP:           133/56 mmHg Patient Gender: F                   HR:            62 bpm. Exam Location:  Inpatient Procedure: 2D Echo, Cardiac Doppler and Color Doppler Indications:    CHF-Acute Diastolic C58.85  History:        Patient has prior history of Echocardiogram examinations, most                 recent 11/18/2020. Risk Factors:Hypertension and Dyslipidemia.                 Chronic anticoagulation, Breast Cancer, DVT, lower                 extremity ([place]) (From Hx).  Sonographer:    Alvino Chapel RCS Referring Phys: [place]  1. Left ventricular ejection fraction, by estimation, is 60 to 65%. The left ventricle has normal function. The left ventricle has no regional wall motion abnormalities. There is moderate left ventricular hypertrophy. Left ventricular diastolic parameters are indeterminate.  2. Right ventricular systolic function is mildly reduced. The right ventricular size is mildly enlarged. Moderately increased right ventricular wall thickness. There is moderately elevated pulmonary artery systolic pressure. The estimated right ventricular systolic pressure is 02.7 mmHg.  3. Left atrial size was moderately dilated.  4. Right atrial size was moderately dilated.  5. A small pericardial effusion is present.  6. The mitral valve is degenerative. Mild  mitral valve regurgitation.  7. Tricuspid valve regurgitation is moderate.  8. The aortic valve is tricuspid. Aortic valve regurgitation is not visualized. Mild to moderate aortic valve stenosis. Vmax 2.4 m/s, MG 12 mmHg, AVA 1.2 cm^2, DI 0.35  9. The inferior vena cava is dilated in size with <50% respiratory variability, suggesting right atrial pressure of 15 mmHg. FINDINGS  Left Ventricle: Left ventricular ejection fraction, by estimation, is 60 to 65%. The left ventricle has normal function. The left ventricle has no regional wall motion abnormalities. The left ventricular internal cavity size was normal in size. There is  moderate left ventricular hypertrophy. Left ventricular diastolic parameters are  indeterminate. Right Ventricle: The right ventricular size is mildly enlarged. Moderately increased right ventricular wall thickness. Right ventricular systolic function is mildly reduced. There is moderately elevated pulmonary artery systolic pressure. The tricuspid regurgitant velocity is 3.11 m/s, and with an assumed right atrial pressure of 15 mmHg, the estimated right ventricular systolic pressure is 86.7 mmHg. Left Atrium: Left atrial size was moderately dilated. Right Atrium: Right atrial size was moderately dilated. Pericardium: A small pericardial effusion is present. Mitral Valve: The mitral valve is degenerative in appearance. Mild mitral valve regurgitation. Tricuspid Valve: The tricuspid valve is normal in structure. Tricuspid valve regurgitation is moderate. Aortic Valve: The aortic valve is tricuspid. Aortic valve regurgitation is not visualized. Mild to moderate aortic stenosis is present. Aortic valve mean gradient measures 12.0 mmHg. Aortic valve peak gradient measures 22.8 mmHg. Aortic valve area, by VTI measures 1.24 cm. Pulmonic Valve: The pulmonic valve was not well visualized. Pulmonic valve regurgitation is mild. Aorta: The aortic root is normal in size and structure. Venous: The inferior vena cava is dilated in size with less than 50% respiratory variability, suggesting right atrial pressure of 15 mmHg. IAS/Shunts: The interatrial septum was not well visualized.  LEFT VENTRICLE PLAX 2D LVIDd:         4.70 cm LVIDs:         2.90 cm LV PW:         1.40 cm LV IVS:        1.30 cm LVOT diam:     2.10 cm LV SV:         67 LV SV Index:   38 LVOT Area:     3.46 cm  RIGHT VENTRICLE RV S prime:     7.54 cm/s TAPSE (M-mode): 1.7 cm LEFT ATRIUM             Index        RIGHT ATRIUM           Index LA diam:        4.60 cm 2.63 cm/m   RA Area:     23.60 cm LA Vol (A2C):   65.6 ml 37.52 ml/m  RA Volume:   80.70 ml  46.16 ml/m LA Vol (A4C):   73.2 ml 41.87 ml/m LA Biplane Vol: 73.5 ml 42.04 ml/m   AORTIC VALVE AV Area (Vmax):    1.29 cm AV Area (Vmean):   1.17 cm AV Area (VTI):     1.24 cm AV Vmax:           239.00 cm/s AV Vmean:          160.000 cm/s AV VTI:            0.540 m AV Peak Grad:      22.8 mmHg AV Mean Grad:      12.0 mmHg LVOT Vmax:  88.80 cm/s LVOT Vmean:        54.000 cm/s LVOT VTI:          0.193 m LVOT/AV VTI ratio: 0.36  AORTA Ao Root diam: 3.50 cm MITRAL VALVE                  TRICUSPID VALVE MV Area (PHT): 5.02 cm       TR Peak grad:   38.7 mmHg MV Decel Time: 151 msec       TR Vmax:        311.00 cm/s MR Peak grad:    60.2 mmHg MR Mean grad:    42.0 mmHg    SHUNTS MR Vmax:         388.00 cm/s  Systemic VTI:  0.19 m MR Vmean:        305.0 cm/s   Systemic Diam: 2.10 cm MR PISA:         1.01 cm MR PISA Eff ROA: 6 mm MR PISA Radius:  0.40 cm MV E velocity: 104.00 cm/s Oswaldo Milian MD Electronically signed by Oswaldo Milian MD Signature Date/Time: 07/03/2021/5:40:25 PM    Final      PERTINENT LAB RESULTS: CBC: Recent Labs    07/04/21 0432 07/05/21 0410  WBC 4.3 4.5  HGB 8.5* 8.8*  HCT 26.2* 27.4*  PLT 130* 145*   CMET CMP     Component Value Date/Time   NA 137 07/04/2021 0432   NA 138 06/28/2021 1055   K 4.1 07/04/2021 0432   CL 106 07/04/2021 0432   CO2 27 07/04/2021 0432   GLUCOSE 96 07/04/2021 0432   BUN 16 07/04/2021 0432   BUN 16 06/28/2021 1055   CREATININE 1.04 (H) 07/04/2021 0432   CREATININE 0.75 08/13/2013 1017   CALCIUM 8.5 (L) 07/04/2021 0432   PROT 5.6 (L) 07/03/2021 0226   PROT 6.4 06/28/2021 1055   ALBUMIN 3.2 (L) 07/03/2021 0226   ALBUMIN 4.2 06/28/2021 1055   AST 20 07/03/2021 0226   ALT 18 07/03/2021 0226   ALKPHOS 50 07/03/2021 0226   BILITOT 0.6 07/03/2021 0226   BILITOT 0.5 06/28/2021 1055   GFRNONAA 51 (L) 07/04/2021 0432   GFRAA 70 04/28/2020 1120    GFR Estimated Creatinine Clearance: 33.9 mL/min (A) (by C-G formula based on SCr of 1.04 mg/dL (H)). No results for input(s): LIPASE, AMYLASE in the last  72 hours. No results for input(s): CKTOTAL, CKMB, CKMBINDEX, TROPONINI in the last 72 hours. Invalid input(s): POCBNP No results for input(s): DDIMER in the last 72 hours. No results for input(s): HGBA1C in the last 72 hours. No results for input(s): CHOL, HDL, LDLCALC, TRIG, CHOLHDL, LDLDIRECT in the last 72 hours. No results for input(s): TSH, T4TOTAL, T3FREE, THYROIDAB in the last 72 hours.  Invalid input(s): FREET3 Recent Labs    07/02/21 1524  VITAMINB12 542  FOLATE 30.4  FERRITIN 8*  TIBC 384  IRON 20*  RETICCTPCT 2.0   Coags: No results for input(s): INR in the last 72 hours.  Invalid input(s): PT Microbiology: Recent Results (from the past 240 hour(s))  Resp Panel by RT-PCR (Flu A&B, Covid) Nasopharyngeal Swab     Status: None   Collection Time: 07/02/21  4:35 PM   Specimen: Nasopharyngeal Swab; Nasopharyngeal(NP) swabs in vial transport medium  Result Value Ref Range Status   SARS Coronavirus 2 by RT PCR NEGATIVE NEGATIVE Final    Comment: (NOTE) SARS-CoV-2 target nucleic acids are NOT DETECTED.  The SARS-CoV-2 RNA is  generally detectable in upper respiratory specimens during the acute phase of infection. The lowest concentration of SARS-CoV-2 viral copies this assay can detect is 138 copies/mL. A negative result does not preclude SARS-Cov-2 infection and should not be used as the sole basis for treatment or other patient management decisions. A negative result may occur with  improper specimen collection/handling, submission of specimen other than nasopharyngeal swab, presence of viral mutation(s) within the areas targeted by this assay, and inadequate number of viral copies(<138 copies/mL). A negative result must be combined with clinical observations, patient history, and epidemiological information. The expected result is Negative.  Fact Sheet for Patients:  EntrepreneurPulse.com.au  Fact Sheet for Healthcare Providers:   IncredibleEmployment.be  This test is no t yet approved or cleared by the [place] FDA and  has been authorized for detection and/or diagnosis of SARS-CoV-2 by FDA under an Emergency Use Authorization (EUA). This EUA will remain  in effect (meaning this test can be used) for the duration of the COVID-19 declaration under Section 564(b)(1) of the Act, 21 U.S.C.section 360bbb-3(b)(1), unless the authorization is terminated  or revoked sooner.       Influenza A by PCR NEGATIVE NEGATIVE Final   Influenza B by PCR NEGATIVE NEGATIVE Final    Comment: (NOTE) The Xpert Xpress SARS-CoV-2/FLU/RSV plus assay is intended as an aid in the diagnosis of influenza from Nasopharyngeal swab specimens and should not be used as a sole basis for treatment. Nasal washings and aspirates are unacceptable for Xpert Xpress SARS-CoV-2/FLU/RSV testing.  Fact Sheet for Patients: EntrepreneurPulse.com.au  Fact Sheet for Healthcare Providers: IncredibleEmployment.be  This test is not yet approved or cleared by the [place] FDA and has been authorized for detection and/or diagnosis of SARS-CoV-2 by FDA under an Emergency Use Authorization (EUA). This EUA will remain in effect (meaning this test can be used) for the duration of the COVID-19 declaration under Section 564(b)(1) of the Act, 21 U.S.C. section 360bbb-3(b)(1), unless the authorization is terminated or revoked.  Performed at [facility], [place] [address] [place], [place] [address]     FURTHER DISCHARGE INSTRUCTIONS:  Get Medicines reviewed and adjusted: Please take all your medications with you for your next visit with your Primary MD  Laboratory/radiological data: Please request your Primary MD to go over all hospital tests and procedure/radiological results at the follow up, please ask your Primary MD to get all Hospital records sent to his/her office.  In some cases,  they will be blood work, cultures and biopsy results pending at the time of your discharge. Please request that your primary care M.D. goes through all the records of your hospital data and follows up on these results.  Also Note the following: If you experience worsening of your admission symptoms, develop shortness of breath, life threatening emergency, suicidal or homicidal thoughts you must seek medical attention immediately by calling 911 or calling your MD immediately  if symptoms less severe.  You must read complete instructions/literature along with all the possible adverse reactions/side effects for all the Medicines you take and that have been prescribed to you. Take any new Medicines after you have completely understood and accpet all the possible adverse reactions/side effects.   Do not drive when taking Pain medications or sleeping medications (Benzodaizepines)  Do not take more than prescribed Pain, Sleep and Anxiety Medications. It is not advisable to combine anxiety,sleep and pain medications without talking with your primary care practitioner  Special Instructions: If you have smoked or chewed  Tobacco  in the last 2 yrs please stop smoking, stop any regular Alcohol  and or any Recreational drug use.  Wear Seat belts while driving.  Please note: You were cared for by a hospitalist during your hospital stay. Once you are discharged, your primary care physician will handle any further medical issues. Please note that NO REFILLS for any discharge medications will be authorized once you are discharged, as it is imperative that you return to your primary care physician (or establish a relationship with a primary care physician if you do not have one) for your post hospital discharge [name] so that they can reassess your need for medications and monitor your lab values.  Total Time spent coordinating discharge including counseling, education and face to face time equals 35  minutes.  SignedOren Binet 07/05/2021 11:02 AM

## 2021-07-05 NOTE — Progress Notes (Signed)
Daily Rounding Note  07/05/2021, 9:29 AM  LOS: 3 days   SUBJECTIVE:   Chief complaint:   intermittent dark stools.     Feeling ok, no specific complaints  OBJECTIVE:         Vital signs in last 24 hours:    Temp:  [97 F (36.1 C)-98 F (36.7 C)] 98 F (36.7 C) (01/23 2115) Pulse Rate:  [60-65] 63 (01/23 2115) Resp:  [12-19] 16 (01/23 2115) BP: (105-158)/(47-61) 129/55 (01/23 2115) SpO2:  [95 %-100 %] 96 % (01/23 2115) Last BM Date: 07/04/21 Filed Weights   07/02/21 2233 07/03/21 0047 07/04/21 0557  Weight: 72 kg 71.6 kg 70.8 kg   General: NAD, not ill looking   Heart: RRR Chest: clear bil.  No labored breathing Abdomen: soft, active BS, NT, ND  Extremities: no CCE Neuro/Psych:  oriented x 3 and appropriate.    Intake/Output from previous day: 01/23 0701 - 01/24 0700 In: 1960 [P.O.:1560; I.V.:400] Out: 350 [Urine:350]  Intake/Output this shift: Total I/O In: 200 [P.O.:200] Out: -   Lab Results: Recent Labs    07/03/21 0226 07/04/21 0432 07/05/21 0410  WBC 4.0 4.3 4.5  HGB 8.0* 8.5* 8.8*  HCT 25.3* 26.2* 27.4*  PLT 128* 130* 145*   BMET Recent Labs    07/02/21 1356 07/03/21 0226 07/04/21 0432  NA 138 139 137  K 4.3 3.9 4.1  CL 105 103 106  CO2 26 28 27   GLUCOSE 110* 108* 96  BUN 21 19 16   CREATININE 0.93 0.87 1.04*  CALCIUM 9.1 8.8* 8.5*   LFT Recent Labs    07/02/21 1356 07/03/21 0226  PROT 6.2* 5.6*  ALBUMIN 3.6 3.2*  AST 32 20  ALT 18 18  ALKPHOS 56 50  BILITOT 1.6* 0.6   PT/INR No results for input(s): LABPROT, INR in the last 72 hours. Hepatitis Panel No results for input(s): HEPBSAG, HCVAB, HEPAIGM, HEPBIGM in the last 72 hours.  Studies/Results: ECHOCARDIOGRAM COMPLETE  Result Date: 07/03/2021    ECHOCARDIOGRAM REPORT   Patient Name:   SUNSET JOSHI Date of Exam: 07/03/2021 Medical Rec #:  [id number]           Height:       63.0 in Accession #:    [id number]           Weight:       157.8 lb Date of Birth:  1928-07-29           BSA:          1.748 m Patient Age:    88 years            BP:           133/56 mmHg Patient Gender: F                   HR:           62 bpm. Exam Location:  Inpatient Procedure: 2D Echo, Cardiac Doppler and Color Doppler Indications:    CHF-Acute Diastolic N00.37  History:        Patient has prior history of Echocardiogram examinations, most                 recent 11/18/2020. Risk Factors:Hypertension and Dyslipidemia.                 Chronic anticoagulation, Breast Cancer, DVT, lower  extremity ([place]) (From Hx).  Sonographer:    Alvino Chapel RCS Referring Phys: [place]  1. Left ventricular ejection fraction, by estimation, is 60 to 65%. The left ventricle has normal function. The left ventricle has no regional wall motion abnormalities. There is moderate left ventricular hypertrophy. Left ventricular diastolic parameters are indeterminate.  2. Right ventricular systolic function is mildly reduced. The right ventricular size is mildly enlarged. Moderately increased right ventricular wall thickness. There is moderately elevated pulmonary artery systolic pressure. The estimated right ventricular systolic pressure is 26.9 mmHg.  3. Left atrial size was moderately dilated.  4. Right atrial size was moderately dilated.  5. A small pericardial effusion is present.  6. The mitral valve is degenerative. Mild mitral valve regurgitation.  7. Tricuspid valve regurgitation is moderate.  8. The aortic valve is tricuspid. Aortic valve regurgitation is not visualized. Mild to moderate aortic valve stenosis. Vmax 2.4 m/s, MG 12 mmHg, AVA 1.2 cm^2, DI 0.35  9. The inferior vena cava is dilated in size with <50% respiratory variability, suggesting right atrial pressure of 15 mmHg. FINDINGS  Left Ventricle: Left ventricular ejection fraction, by estimation, is 60 to 65%. The left ventricle has normal function. The left ventricle  has no regional wall motion abnormalities. The left ventricular internal cavity size was normal in size. There is  moderate left ventricular hypertrophy. Left ventricular diastolic parameters are indeterminate. Right Ventricle: The right ventricular size is mildly enlarged. Moderately increased right ventricular wall thickness. Right ventricular systolic function is mildly reduced. There is moderately elevated pulmonary artery systolic pressure. The tricuspid regurgitant velocity is 3.11 m/s, and with an assumed right atrial pressure of 15 mmHg, the estimated right ventricular systolic pressure is 48.5 mmHg. Left Atrium: Left atrial size was moderately dilated. Right Atrium: Right atrial size was moderately dilated. Pericardium: A small pericardial effusion is present. Mitral Valve: The mitral valve is degenerative in appearance. Mild mitral valve regurgitation. Tricuspid Valve: The tricuspid valve is normal in structure. Tricuspid valve regurgitation is moderate. Aortic Valve: The aortic valve is tricuspid. Aortic valve regurgitation is not visualized. Mild to moderate aortic stenosis is present. Aortic valve mean gradient measures 12.0 mmHg. Aortic valve peak gradient measures 22.8 mmHg. Aortic valve area, by VTI measures 1.24 cm. Pulmonic Valve: The pulmonic valve was not well visualized. Pulmonic valve regurgitation is mild. Aorta: The aortic root is normal in size and structure. Venous: The inferior vena cava is dilated in size with less than 50% respiratory variability, suggesting right atrial pressure of 15 mmHg. IAS/Shunts: The interatrial septum was not well visualized.  LEFT VENTRICLE PLAX 2D LVIDd:         4.70 cm LVIDs:         2.90 cm LV PW:         1.40 cm LV IVS:        1.30 cm LVOT diam:     2.10 cm LV SV:         67 LV SV Index:   38 LVOT Area:     3.46 cm  RIGHT VENTRICLE RV S prime:     7.54 cm/s TAPSE (M-mode): 1.7 cm LEFT ATRIUM             Index        RIGHT ATRIUM           Index LA diam:         4.60 cm 2.63 cm/m   RA Area:  23.60 cm LA Vol (A2C):   65.6 ml 37.52 ml/m  RA Volume:   80.70 ml  46.16 ml/m LA Vol (A4C):   73.2 ml 41.87 ml/m LA Biplane Vol: 73.5 ml 42.04 ml/m  AORTIC VALVE AV Area (Vmax):    1.29 cm AV Area (Vmean):   1.17 cm AV Area (VTI):     1.24 cm AV Vmax:           239.00 cm/s AV Vmean:          160.000 cm/s AV VTI:            0.540 m AV Peak Grad:      22.8 mmHg AV Mean Grad:      12.0 mmHg LVOT Vmax:         88.80 cm/s LVOT Vmean:        54.000 cm/s LVOT VTI:          0.193 m LVOT/AV VTI ratio: 0.36  AORTA Ao Root diam: 3.50 cm MITRAL VALVE                  TRICUSPID VALVE MV Area (PHT): 5.02 cm       TR Peak grad:   38.7 mmHg MV Decel Time: 151 msec       TR Vmax:        311.00 cm/s MR Peak grad:    60.2 mmHg MR Mean grad:    42.0 mmHg    SHUNTS MR Vmax:         388.00 cm/s  Systemic VTI:  0.19 m MR Vmean:        305.0 cm/s   Systemic Diam: 2.10 cm MR PISA:         1.01 cm MR PISA Eff ROA: 6 mm MR PISA Radius:  0.40 cm MV E velocity: 104.00 cm/s Oswaldo Milian MD Electronically signed by Oswaldo Milian MD Signature Date/Time: 07/03/2021/5:40:25 PM    Final     ASSESMENT:     IDA.  07/04/21 EGD: Subtle mucosal abnormality in gastric body biopsied, and likely clinically significant.   No PRBCs to date.  Hgb 8 ... 8.8.  Last colonoscopy normal 2012.  Chronic Eliquis.  On hold.  Previous cardiac pacemaker for SSS.  2D echo as above   PLAN   Pt and family declined colonoscopy.  Hgb is stable so will sign off.  If recurrent anemia, colonoscopy offer stands but may want to consider stopping eliquis.  Ok to restart this if necessary.  GI signing off.  No plans for GI outpt fup.        Azucena Freed  07/05/2021, 9:29 AM Phone [phone]

## 2021-07-05 NOTE — Assessment & Plan Note (Addendum)
Seen on EGD on 1/23-follow biopsy results-defer to gastroenterology

## 2021-07-06 ENCOUNTER — Telehealth: Payer: Self-pay

## 2021-07-06 DIAGNOSIS — Z950 Presence of cardiac pacemaker: Secondary | ICD-10-CM | POA: Diagnosis not present

## 2021-07-06 DIAGNOSIS — M810 Age-related osteoporosis without current pathological fracture: Secondary | ICD-10-CM | POA: Diagnosis not present

## 2021-07-06 DIAGNOSIS — I4892 Unspecified atrial flutter: Secondary | ICD-10-CM | POA: Diagnosis not present

## 2021-07-06 DIAGNOSIS — E538 Deficiency of other specified B group vitamins: Secondary | ICD-10-CM | POA: Diagnosis not present

## 2021-07-06 DIAGNOSIS — I4819 Other persistent atrial fibrillation: Secondary | ICD-10-CM | POA: Diagnosis not present

## 2021-07-06 DIAGNOSIS — E782 Mixed hyperlipidemia: Secondary | ICD-10-CM | POA: Diagnosis not present

## 2021-07-06 DIAGNOSIS — I110 Hypertensive heart disease with heart failure: Secondary | ICD-10-CM | POA: Diagnosis not present

## 2021-07-06 DIAGNOSIS — Z86718 Personal history of other venous thrombosis and embolism: Secondary | ICD-10-CM | POA: Diagnosis not present

## 2021-07-06 DIAGNOSIS — H409 Unspecified glaucoma: Secondary | ICD-10-CM | POA: Diagnosis not present

## 2021-07-06 DIAGNOSIS — I5033 Acute on chronic diastolic (congestive) heart failure: Secondary | ICD-10-CM | POA: Diagnosis not present

## 2021-07-06 DIAGNOSIS — M1990 Unspecified osteoarthritis, unspecified site: Secondary | ICD-10-CM | POA: Diagnosis not present

## 2021-07-06 DIAGNOSIS — K589 Irritable bowel syndrome without diarrhea: Secondary | ICD-10-CM | POA: Diagnosis not present

## 2021-07-06 DIAGNOSIS — K3189 Other diseases of stomach and duodenum: Secondary | ICD-10-CM | POA: Diagnosis not present

## 2021-07-06 DIAGNOSIS — I495 Sick sinus syndrome: Secondary | ICD-10-CM | POA: Diagnosis not present

## 2021-07-06 DIAGNOSIS — K219 Gastro-esophageal reflux disease without esophagitis: Secondary | ICD-10-CM | POA: Diagnosis not present

## 2021-07-06 DIAGNOSIS — K449 Diaphragmatic hernia without obstruction or gangrene: Secondary | ICD-10-CM | POA: Diagnosis not present

## 2021-07-06 DIAGNOSIS — D509 Iron deficiency anemia, unspecified: Secondary | ICD-10-CM | POA: Diagnosis not present

## 2021-07-06 DIAGNOSIS — Z7901 Long term (current) use of anticoagulants: Secondary | ICD-10-CM | POA: Diagnosis not present

## 2021-07-06 LAB — SURGICAL PATHOLOGY

## 2021-07-06 NOTE — Telephone Encounter (Signed)
Transition Care Management Follow-up Telephone Call Date of discharge and from where: 07/05/21 [name] Diagnosis: Acute on Chronic CHF How have you been since you were released from the hospital? Pt's son, [name], states pt is doing well. Any questions or concerns? No  Items Reviewed: Did the pt receive and understand the discharge instructions provided? Yes  Medications obtained and verified? Yes  Other? No  Any new allergies since your discharge? No  Dietary orders reviewed? Yes Do you have support at home? Yes   Home Care and Equipment/Supplies: Were home health services ordered? no If so, what is the name of the agency? N/A  Has the agency set up a time to come to the patient's home? not applicable Were any new equipment or medical supplies ordered?  No What is the name of the medical supply agency? N/A Were you able to get the supplies/equipment? not applicable Do you have any questions related to the use of the equipment or supplies? No  Functional Questionnaire: (I = Independent and D = Dependent) ADLs: I  Bathing/Dressing- I  Meal Prep- I  Eating- I  Maintaining continence- I  Transferring/Ambulation- I  Managing Meds- I  Follow up appointments reviewed:  PCP Hospital f/u appt confirmed? Yes  Scheduled to see Dr. Warrick Parisian on 07/07/21 @ 8:10 am. [facility] f/u appt confirmed?  F/U with Cardiology within one month.   Are transportation arrangements needed? No  If their condition worsens, is the pt aware to call PCP or go to the Emergency Dept.? Yes Was the patient provided with contact information for the PCP's office or ED? Yes Was to pt encouraged to call back with questions or concerns? Yes

## 2021-07-07 ENCOUNTER — Encounter: Payer: Self-pay | Admitting: Gastroenterology

## 2021-07-07 ENCOUNTER — Telehealth: Payer: Self-pay | Admitting: Cardiology

## 2021-07-07 ENCOUNTER — Ambulatory Visit (INDEPENDENT_AMBULATORY_CARE_PROVIDER_SITE_OTHER): Payer: Medicare Other | Admitting: Family Medicine

## 2021-07-07 ENCOUNTER — Encounter: Payer: Self-pay | Admitting: Family Medicine

## 2021-07-07 VITALS — BP 127/80 | HR 62 | Ht 63.0 in | Wt 154.0 lb

## 2021-07-07 DIAGNOSIS — D509 Iron deficiency anemia, unspecified: Secondary | ICD-10-CM

## 2021-07-07 DIAGNOSIS — I5043 Acute on chronic combined systolic (congestive) and diastolic (congestive) heart failure: Secondary | ICD-10-CM | POA: Diagnosis not present

## 2021-07-07 NOTE — Progress Notes (Signed)
BP 127/80    Pulse 62    Ht $R'5\' 3"'Su$  (1.6 m)    Wt 154 lb (69.9 kg)    SpO2 98%    BMI 27.28 kg/m    Subjective:   Patient ID: [name], female    DOB: May 06, 1929, 88 y.o.   MRN: [id number]  HPI: [name] is a 88 y.o. female presenting on 07/07/2021 for Hospitalization Follow-up (Low Hgb, sob)   HPI Transition Care Management Follow-up Telephone Call Date of discharge and from where: 07/05/21 [facility] Diagnosis: Acute on Chronic CHF How have you been since you were released from the hospital? Pt's son, [name], states pt is doing well. Any questions or concerns? No Things patient was contacted by Randal Buba, LPN on 01/27/5630  Transition of care office visit Patient was admitted to the hospital on 07/02/2021 and discharged on 07/05/2021 for CHF and symptomatic anemia.  She went into the hospital with shortness of breath and fatigue and exertional dyspnea and edema and was found to have acute on chronic CHF and symptomatic iron deficiency anemia.  They did do an FOBT and it was negative.  They did do an EGD and did not find a bleeding source.  Colonoscopy was, completed but family decided to hold off for now so they will do that outpatient.  It looks as though her family it was discussed whether to restart Eliquis or not and they opted to restart Eliquis.  She does take Eliquis for A. fib. Her breathing and her yes better bleeding has been much improved.  She has been she is essentially back to baseline.  She denies any further blood in her stool.  She feels like her swelling is about like baseline.  She does have some swelling in her extremities but denies any shortness of breath on exertion although she admits she has not done much.  But when she walked in here today to the office she said she did not get short of breath.   Relevant past medical, surgical, family and social history reviewed and updated as indicated. Interim medical history since our last visit  reviewed. Allergies and medications reviewed and updated.  Review of Systems  Constitutional:  Negative for chills and fever.  Eyes:  Negative for redness and visual disturbance.  Respiratory:  Negative for chest tightness and shortness of breath.   Cardiovascular:  Positive for leg swelling. Negative for chest pain and palpitations.  Skin:  Negative for rash.  Neurological:  Negative for light-headedness and headaches.  Psychiatric/Behavioral:  Negative for agitation and behavioral problems.   All other systems reviewed and are negative.  Per HPI unless specifically indicated above   Allergies as of 07/07/2021       Reactions   Iodinated Contrast Media Hives, Itching, Rash   03/13/14: symptoms began within two hours of intrathecal injection (myelogram 03/11/14); on shoulders radiating down to stomach.  Relief with Benadryl.  Told patient she would need to pre-med with Benadryl in the future before receiving this contrast.  Brita Romp, RN   Naproxen Hives, Itching, Rash        Medication List        Accurate as of July 07, 2021  8:43 AM. If you have any questions, ask your nurse or doctor.          acetaminophen 500 MG tablet Commonly known as: TYLENOL Take 1,000 mg by mouth every 6 (six) hours as needed for mild pain.   amiodarone  200 MG tablet Commonly known as: PACERONE TAKE ONE (1) TABLET EACH DAY What changed: See the new instructions.   apixaban 5 MG Tabs tablet Commonly known as: Eliquis Take 1 tablet (5 mg total) by mouth 2 (two) times daily.   Benefiber Powd Take 1 Dose by mouth daily as needed (constipation).   calcium-vitamin D 500-200 MG-UNIT tablet Commonly known as: OSCAL WITH D Take 1 tablet by mouth daily with breakfast.   cyanocobalamin 1000 MCG/ML injection Commonly known as: (VITAMIN B-12) Inject 1 mL (1,000 mcg total) into the muscle every 30 (thirty) days.   FeroSul 325 (65 FE) MG tablet Generic drug: ferrous sulfate Take 1 tablet  (325 mg total) by mouth 2 (two) times daily.   furosemide 20 MG tablet Commonly known as: LASIX Take 2 tablets (40 mg total) by mouth daily.   latanoprost 0.005 % ophthalmic solution Commonly known as: XALATAN Place 1 drop into both eyes at bedtime.   melatonin 5 MG Tabs Take 5 mg by mouth at bedtime as needed (sleep).   multivitamin tablet Take 1 tablet by mouth every morning.   nadolol 40 MG tablet Commonly known as: CORGARD Take 1.5 tablets (60 mg total) by mouth daily.   nitroGLYCERIN 0.4 MG SL tablet Commonly known as: NITROSTAT DISSOLVE 1 TAB UNDER TOUNGE FOR CHEST PAIN. MAY REPEAT EVERY 5 MINUTES FOR 3 DOSES. IF NO RELIEF CALL 911 OR GO TO ER What changed: See the new instructions.   ondansetron 4 MG disintegrating tablet Commonly known as: ZOFRAN-ODT TAKE 1 TABLET BY MOUTH EVERY 8 HOURS AS NEEDED FOR NAUSEA & VOMITING What changed: reasons to take this   pantoprazole 40 MG tablet Commonly known as: PROTONIX TAKE ONE (1) TABLET EACH DAY What changed: See the new instructions.   potassium chloride SA 20 MEQ tablet Commonly known as: KLOR-CON M Take 1 tablet (20 mEq total) by mouth daily.         Objective:   BP 127/80    Pulse 62    Ht _0  (1.6 m)    Wt 154 lb (69.9 kg)    SpO2 98%    BMI 27.28 kg/m   Wt Readings from Last 3 Encounters:  07/07/21 154 lb (69.9 kg)  07/04/21 156 lb 1.6 oz (70.8 kg)  06/28/21 165 lb (74.8 kg)    Physical Exam Vitals and nursing note reviewed.  Constitutional:      General: She is not in acute distress.    Appearance: She is well-developed. She is not diaphoretic.  Eyes:     Conjunctiva/sclera: Conjunctivae normal.  Cardiovascular:     Rate and Rhythm: Normal rate and regular rhythm.     Heart sounds: Normal heart sounds. No murmur heard. Pulmonary:     Effort: Pulmonary effort is normal. No respiratory distress.     Breath sounds: Normal breath sounds. No wheezing.  Musculoskeletal:        General: Swelling (2+  edema bilateral lower extremity) present. No tenderness. Normal range of motion.  Skin:    General: Skin is warm and dry.     Findings: No rash.  Neurological:     Mental Status: She is alert and oriented to person, place, and time.     Coordination: Coordination normal.  Psychiatric:        Behavior: Behavior normal.      Assessment & Plan:   Problem List Items Addressed This Visit       Other   Iron deficiency anemia (  Chronic)   Relevant Orders   CBC with Differential/Platelet   CMP14+EGFR   Other Visit Diagnoses     Acute on chronic combined systolic and diastolic CHF (congestive heart failure) ([place])    -  Primary   Relevant Orders   CBC with Differential/Platelet   CMP14+EGFR       We will check blood work today and then have home health check a CBC and a CHEM panel in 1 week as well. Follow up plan: Return if symptoms worsen or fail to improve.  Counseling provided for all of the vaccine components Orders Placed This Encounter  Procedures   CBC with Differential/Platelet   Lyon Jupiter Kabir, MD [facility] 07/07/2021, 8:43 AM

## 2021-07-08 DIAGNOSIS — E782 Mixed hyperlipidemia: Secondary | ICD-10-CM | POA: Diagnosis not present

## 2021-07-08 DIAGNOSIS — H409 Unspecified glaucoma: Secondary | ICD-10-CM | POA: Diagnosis not present

## 2021-07-08 DIAGNOSIS — Z86718 Personal history of other venous thrombosis and embolism: Secondary | ICD-10-CM | POA: Diagnosis not present

## 2021-07-08 DIAGNOSIS — D509 Iron deficiency anemia, unspecified: Secondary | ICD-10-CM | POA: Diagnosis not present

## 2021-07-08 DIAGNOSIS — K449 Diaphragmatic hernia without obstruction or gangrene: Secondary | ICD-10-CM | POA: Diagnosis not present

## 2021-07-08 DIAGNOSIS — E538 Deficiency of other specified B group vitamins: Secondary | ICD-10-CM | POA: Diagnosis not present

## 2021-07-08 DIAGNOSIS — K589 Irritable bowel syndrome without diarrhea: Secondary | ICD-10-CM | POA: Diagnosis not present

## 2021-07-08 DIAGNOSIS — I110 Hypertensive heart disease with heart failure: Secondary | ICD-10-CM | POA: Diagnosis not present

## 2021-07-08 DIAGNOSIS — Z950 Presence of cardiac pacemaker: Secondary | ICD-10-CM | POA: Diagnosis not present

## 2021-07-08 DIAGNOSIS — I495 Sick sinus syndrome: Secondary | ICD-10-CM | POA: Diagnosis not present

## 2021-07-08 DIAGNOSIS — K219 Gastro-esophageal reflux disease without esophagitis: Secondary | ICD-10-CM | POA: Diagnosis not present

## 2021-07-08 DIAGNOSIS — M1990 Unspecified osteoarthritis, unspecified site: Secondary | ICD-10-CM | POA: Diagnosis not present

## 2021-07-08 DIAGNOSIS — I5033 Acute on chronic diastolic (congestive) heart failure: Secondary | ICD-10-CM | POA: Diagnosis not present

## 2021-07-08 DIAGNOSIS — M810 Age-related osteoporosis without current pathological fracture: Secondary | ICD-10-CM | POA: Diagnosis not present

## 2021-07-08 DIAGNOSIS — Z7901 Long term (current) use of anticoagulants: Secondary | ICD-10-CM | POA: Diagnosis not present

## 2021-07-08 DIAGNOSIS — I4819 Other persistent atrial fibrillation: Secondary | ICD-10-CM | POA: Diagnosis not present

## 2021-07-08 DIAGNOSIS — I4892 Unspecified atrial flutter: Secondary | ICD-10-CM | POA: Diagnosis not present

## 2021-07-08 DIAGNOSIS — K3189 Other diseases of stomach and duodenum: Secondary | ICD-10-CM | POA: Diagnosis not present

## 2021-07-08 LAB — CMP14+EGFR
ALT: 14 IU/L (ref 0–32)
AST: 19 IU/L (ref 0–40)
Albumin/Globulin Ratio: 2 (ref 1.2–2.2)
Albumin: 4.5 g/dL (ref 3.5–4.6)
Alkaline Phosphatase: 75 IU/L (ref 44–121)
BUN/Creatinine Ratio: 21 (ref 12–28)
BUN: 20 mg/dL (ref 10–36)
Bilirubin Total: 0.5 mg/dL (ref 0.0–1.2)
CO2: 25 mmol/L (ref 20–29)
Calcium: 9.9 mg/dL (ref 8.7–10.3)
Chloride: 99 mmol/L (ref 96–106)
Creatinine, Ser: 0.97 mg/dL (ref 0.57–1.00)
Globulin, Total: 2.2 g/dL (ref 1.5–4.5)
Glucose: 106 mg/dL — ABNORMAL HIGH (ref 70–99)
Potassium: 4.4 mmol/L (ref 3.5–5.2)
Sodium: 140 mmol/L (ref 134–144)
Total Protein: 6.7 g/dL (ref 6.0–8.5)
eGFR: 56 mL/min/{1.73_m2} — ABNORMAL LOW (ref >59)

## 2021-07-08 LAB — CBC WITH DIFFERENTIAL/PLATELET
Basophils Absolute: 0 10*3/uL (ref 0.0–0.2)
Basos: 1 %
EOS (ABSOLUTE): 0.3 10*3/uL (ref 0.0–0.4)
Eos: 5 %
Hematocrit: 31.6 % — ABNORMAL LOW (ref 34.0–46.6)
Hemoglobin: 10.1 g/dL — ABNORMAL LOW (ref 11.1–15.9)
Immature Grans (Abs): 0 10*3/uL (ref 0.0–0.1)
Immature Granulocytes: 0 %
Lymphocytes Absolute: 1.6 10*3/uL (ref 0.7–3.1)
Lymphs: 28 %
MCH: 29.3 pg (ref 26.6–33.0)
MCHC: 32 g/dL (ref 31.5–35.7)
MCV: 92 fL (ref 79–97)
Monocytes Absolute: 0.7 10*3/uL (ref 0.1–0.9)
Monocytes: 13 %
Neutrophils Absolute: 2.9 10*3/uL (ref 1.4–7.0)
Neutrophils: 53 %
Platelets: 193 10*3/uL (ref 150–450)
RBC: 3.45 x10E6/uL — ABNORMAL LOW (ref 3.77–5.28)
RDW: 14.6 % (ref 11.7–15.4)
WBC: 5.6 10*3/uL (ref 3.4–10.8)

## 2021-07-11 ENCOUNTER — Other Ambulatory Visit: Payer: Medicare Other

## 2021-07-11 DIAGNOSIS — K589 Irritable bowel syndrome without diarrhea: Secondary | ICD-10-CM | POA: Diagnosis not present

## 2021-07-11 DIAGNOSIS — D509 Iron deficiency anemia, unspecified: Secondary | ICD-10-CM | POA: Diagnosis not present

## 2021-07-11 DIAGNOSIS — E782 Mixed hyperlipidemia: Secondary | ICD-10-CM

## 2021-07-11 DIAGNOSIS — Z950 Presence of cardiac pacemaker: Secondary | ICD-10-CM | POA: Diagnosis not present

## 2021-07-11 DIAGNOSIS — E538 Deficiency of other specified B group vitamins: Secondary | ICD-10-CM | POA: Diagnosis not present

## 2021-07-11 DIAGNOSIS — H409 Unspecified glaucoma: Secondary | ICD-10-CM | POA: Diagnosis not present

## 2021-07-11 DIAGNOSIS — M810 Age-related osteoporosis without current pathological fracture: Secondary | ICD-10-CM | POA: Diagnosis not present

## 2021-07-11 DIAGNOSIS — I10 Essential (primary) hypertension: Secondary | ICD-10-CM | POA: Diagnosis not present

## 2021-07-11 DIAGNOSIS — M1990 Unspecified osteoarthritis, unspecified site: Secondary | ICD-10-CM | POA: Diagnosis not present

## 2021-07-11 DIAGNOSIS — Z86718 Personal history of other venous thrombosis and embolism: Secondary | ICD-10-CM | POA: Diagnosis not present

## 2021-07-11 DIAGNOSIS — I4819 Other persistent atrial fibrillation: Secondary | ICD-10-CM | POA: Diagnosis not present

## 2021-07-11 DIAGNOSIS — I5033 Acute on chronic diastolic (congestive) heart failure: Secondary | ICD-10-CM | POA: Diagnosis not present

## 2021-07-11 DIAGNOSIS — K219 Gastro-esophageal reflux disease without esophagitis: Secondary | ICD-10-CM | POA: Diagnosis not present

## 2021-07-11 DIAGNOSIS — I495 Sick sinus syndrome: Secondary | ICD-10-CM | POA: Diagnosis not present

## 2021-07-11 DIAGNOSIS — I4892 Unspecified atrial flutter: Secondary | ICD-10-CM | POA: Diagnosis not present

## 2021-07-11 DIAGNOSIS — K449 Diaphragmatic hernia without obstruction or gangrene: Secondary | ICD-10-CM | POA: Diagnosis not present

## 2021-07-11 DIAGNOSIS — K3189 Other diseases of stomach and duodenum: Secondary | ICD-10-CM | POA: Diagnosis not present

## 2021-07-11 DIAGNOSIS — I110 Hypertensive heart disease with heart failure: Secondary | ICD-10-CM | POA: Diagnosis not present

## 2021-07-11 DIAGNOSIS — Z7901 Long term (current) use of anticoagulants: Secondary | ICD-10-CM | POA: Diagnosis not present

## 2021-07-12 LAB — CBC WITH DIFFERENTIAL/PLATELET
Basophils Absolute: 0 10*3/uL (ref 0.0–0.2)
Basos: 1 %
EOS (ABSOLUTE): 0.2 10*3/uL (ref 0.0–0.4)
Eos: 5 %
Hematocrit: 29.4 % — ABNORMAL LOW (ref 34.0–46.6)
Hemoglobin: 9.6 g/dL — ABNORMAL LOW (ref 11.1–15.9)
Immature Grans (Abs): 0 10*3/uL (ref 0.0–0.1)
Immature Granulocytes: 1 %
Lymphocytes Absolute: 1.4 10*3/uL (ref 0.7–3.1)
Lymphs: 31 %
MCH: 30.3 pg (ref 26.6–33.0)
MCHC: 32.7 g/dL (ref 31.5–35.7)
MCV: 93 fL (ref 79–97)
Monocytes Absolute: 0.5 10*3/uL (ref 0.1–0.9)
Monocytes: 12 %
Neutrophils Absolute: 2.3 10*3/uL (ref 1.4–7.0)
Neutrophils: 50 %
Platelets: 176 10*3/uL (ref 150–450)
RBC: 3.17 x10E6/uL — ABNORMAL LOW (ref 3.77–5.28)
RDW: 15.7 % — ABNORMAL HIGH (ref 11.7–15.4)
WBC: 4.4 10*3/uL (ref 3.4–10.8)

## 2021-07-12 LAB — CMP14+EGFR
ALT: 15 IU/L (ref 0–32)
AST: 27 IU/L (ref 0–40)
Albumin/Globulin Ratio: 2.2 (ref 1.2–2.2)
Albumin: 4.3 g/dL (ref 3.5–4.6)
Alkaline Phosphatase: 68 IU/L (ref 44–121)
BUN/Creatinine Ratio: 18 (ref 12–28)
BUN: 17 mg/dL (ref 10–36)
Bilirubin Total: 0.4 mg/dL (ref 0.0–1.2)
CO2: 25 mmol/L (ref 20–29)
Calcium: 9.3 mg/dL (ref 8.7–10.3)
Chloride: 99 mmol/L (ref 96–106)
Creatinine, Ser: 0.93 mg/dL (ref 0.57–1.00)
Globulin, Total: 2 g/dL (ref 1.5–4.5)
Glucose: 96 mg/dL (ref 70–99)
Potassium: 4.4 mmol/L (ref 3.5–5.2)
Sodium: 139 mmol/L (ref 134–144)
Total Protein: 6.3 g/dL (ref 6.0–8.5)
eGFR: 58 mL/min/{1.73_m2} — ABNORMAL LOW (ref >59)

## 2021-07-14 NOTE — Progress Notes (Signed)
Remote pacemaker transmission.   

## 2021-07-15 DIAGNOSIS — Z86718 Personal history of other venous thrombosis and embolism: Secondary | ICD-10-CM | POA: Diagnosis not present

## 2021-07-15 DIAGNOSIS — K3189 Other diseases of stomach and duodenum: Secondary | ICD-10-CM | POA: Diagnosis not present

## 2021-07-15 DIAGNOSIS — I110 Hypertensive heart disease with heart failure: Secondary | ICD-10-CM | POA: Diagnosis not present

## 2021-07-15 DIAGNOSIS — Z7901 Long term (current) use of anticoagulants: Secondary | ICD-10-CM | POA: Diagnosis not present

## 2021-07-15 DIAGNOSIS — K589 Irritable bowel syndrome without diarrhea: Secondary | ICD-10-CM | POA: Diagnosis not present

## 2021-07-15 DIAGNOSIS — I5033 Acute on chronic diastolic (congestive) heart failure: Secondary | ICD-10-CM | POA: Diagnosis not present

## 2021-07-15 DIAGNOSIS — M810 Age-related osteoporosis without current pathological fracture: Secondary | ICD-10-CM | POA: Diagnosis not present

## 2021-07-15 DIAGNOSIS — K449 Diaphragmatic hernia without obstruction or gangrene: Secondary | ICD-10-CM | POA: Diagnosis not present

## 2021-07-15 DIAGNOSIS — Z950 Presence of cardiac pacemaker: Secondary | ICD-10-CM | POA: Diagnosis not present

## 2021-07-15 DIAGNOSIS — H409 Unspecified glaucoma: Secondary | ICD-10-CM | POA: Diagnosis not present

## 2021-07-15 DIAGNOSIS — I4819 Other persistent atrial fibrillation: Secondary | ICD-10-CM | POA: Diagnosis not present

## 2021-07-15 DIAGNOSIS — E782 Mixed hyperlipidemia: Secondary | ICD-10-CM | POA: Diagnosis not present

## 2021-07-15 DIAGNOSIS — D509 Iron deficiency anemia, unspecified: Secondary | ICD-10-CM | POA: Diagnosis not present

## 2021-07-15 DIAGNOSIS — E538 Deficiency of other specified B group vitamins: Secondary | ICD-10-CM | POA: Diagnosis not present

## 2021-07-15 DIAGNOSIS — I495 Sick sinus syndrome: Secondary | ICD-10-CM | POA: Diagnosis not present

## 2021-07-15 DIAGNOSIS — I4892 Unspecified atrial flutter: Secondary | ICD-10-CM | POA: Diagnosis not present

## 2021-07-15 DIAGNOSIS — K219 Gastro-esophageal reflux disease without esophagitis: Secondary | ICD-10-CM | POA: Diagnosis not present

## 2021-07-15 DIAGNOSIS — M1990 Unspecified osteoarthritis, unspecified site: Secondary | ICD-10-CM | POA: Diagnosis not present

## 2021-07-18 ENCOUNTER — Other Ambulatory Visit: Payer: Self-pay

## 2021-07-18 ENCOUNTER — Ambulatory Visit (INDEPENDENT_AMBULATORY_CARE_PROVIDER_SITE_OTHER): Payer: Medicare Other

## 2021-07-18 DIAGNOSIS — E782 Mixed hyperlipidemia: Secondary | ICD-10-CM | POA: Diagnosis not present

## 2021-07-18 DIAGNOSIS — D509 Iron deficiency anemia, unspecified: Secondary | ICD-10-CM | POA: Diagnosis not present

## 2021-07-18 DIAGNOSIS — K449 Diaphragmatic hernia without obstruction or gangrene: Secondary | ICD-10-CM

## 2021-07-18 DIAGNOSIS — I4819 Other persistent atrial fibrillation: Secondary | ICD-10-CM | POA: Diagnosis not present

## 2021-07-18 DIAGNOSIS — I4892 Unspecified atrial flutter: Secondary | ICD-10-CM | POA: Diagnosis not present

## 2021-07-18 DIAGNOSIS — K589 Irritable bowel syndrome without diarrhea: Secondary | ICD-10-CM | POA: Diagnosis not present

## 2021-07-18 DIAGNOSIS — M1990 Unspecified osteoarthritis, unspecified site: Secondary | ICD-10-CM

## 2021-07-18 DIAGNOSIS — K3189 Other diseases of stomach and duodenum: Secondary | ICD-10-CM | POA: Diagnosis not present

## 2021-07-18 DIAGNOSIS — I495 Sick sinus syndrome: Secondary | ICD-10-CM

## 2021-07-18 DIAGNOSIS — I5033 Acute on chronic diastolic (congestive) heart failure: Secondary | ICD-10-CM | POA: Diagnosis not present

## 2021-07-18 DIAGNOSIS — M810 Age-related osteoporosis without current pathological fracture: Secondary | ICD-10-CM

## 2021-07-18 DIAGNOSIS — E538 Deficiency of other specified B group vitamins: Secondary | ICD-10-CM | POA: Diagnosis not present

## 2021-07-18 DIAGNOSIS — I110 Hypertensive heart disease with heart failure: Secondary | ICD-10-CM | POA: Diagnosis not present

## 2021-07-18 DIAGNOSIS — K219 Gastro-esophageal reflux disease without esophagitis: Secondary | ICD-10-CM

## 2021-07-18 DIAGNOSIS — H409 Unspecified glaucoma: Secondary | ICD-10-CM

## 2021-07-20 DIAGNOSIS — E782 Mixed hyperlipidemia: Secondary | ICD-10-CM | POA: Diagnosis not present

## 2021-07-20 DIAGNOSIS — M1990 Unspecified osteoarthritis, unspecified site: Secondary | ICD-10-CM | POA: Diagnosis not present

## 2021-07-20 DIAGNOSIS — I4819 Other persistent atrial fibrillation: Secondary | ICD-10-CM | POA: Diagnosis not present

## 2021-07-20 DIAGNOSIS — Z950 Presence of cardiac pacemaker: Secondary | ICD-10-CM | POA: Diagnosis not present

## 2021-07-20 DIAGNOSIS — M810 Age-related osteoporosis without current pathological fracture: Secondary | ICD-10-CM | POA: Diagnosis not present

## 2021-07-20 DIAGNOSIS — K589 Irritable bowel syndrome without diarrhea: Secondary | ICD-10-CM | POA: Diagnosis not present

## 2021-07-20 DIAGNOSIS — I5033 Acute on chronic diastolic (congestive) heart failure: Secondary | ICD-10-CM | POA: Diagnosis not present

## 2021-07-20 DIAGNOSIS — I4892 Unspecified atrial flutter: Secondary | ICD-10-CM | POA: Diagnosis not present

## 2021-07-20 DIAGNOSIS — Z7901 Long term (current) use of anticoagulants: Secondary | ICD-10-CM | POA: Diagnosis not present

## 2021-07-20 DIAGNOSIS — I495 Sick sinus syndrome: Secondary | ICD-10-CM | POA: Diagnosis not present

## 2021-07-20 DIAGNOSIS — H409 Unspecified glaucoma: Secondary | ICD-10-CM | POA: Diagnosis not present

## 2021-07-20 DIAGNOSIS — E538 Deficiency of other specified B group vitamins: Secondary | ICD-10-CM | POA: Diagnosis not present

## 2021-07-20 DIAGNOSIS — K219 Gastro-esophageal reflux disease without esophagitis: Secondary | ICD-10-CM | POA: Diagnosis not present

## 2021-07-20 DIAGNOSIS — I110 Hypertensive heart disease with heart failure: Secondary | ICD-10-CM | POA: Diagnosis not present

## 2021-07-20 DIAGNOSIS — K449 Diaphragmatic hernia without obstruction or gangrene: Secondary | ICD-10-CM | POA: Diagnosis not present

## 2021-07-20 DIAGNOSIS — K3189 Other diseases of stomach and duodenum: Secondary | ICD-10-CM | POA: Diagnosis not present

## 2021-07-20 DIAGNOSIS — Z86718 Personal history of other venous thrombosis and embolism: Secondary | ICD-10-CM | POA: Diagnosis not present

## 2021-07-20 DIAGNOSIS — D509 Iron deficiency anemia, unspecified: Secondary | ICD-10-CM | POA: Diagnosis not present

## 2021-07-26 ENCOUNTER — Other Ambulatory Visit: Payer: Medicare Other

## 2021-07-26 ENCOUNTER — Other Ambulatory Visit: Payer: Self-pay | Admitting: *Deleted

## 2021-07-26 DIAGNOSIS — K449 Diaphragmatic hernia without obstruction or gangrene: Secondary | ICD-10-CM | POA: Diagnosis not present

## 2021-07-26 DIAGNOSIS — K219 Gastro-esophageal reflux disease without esophagitis: Secondary | ICD-10-CM | POA: Diagnosis not present

## 2021-07-26 DIAGNOSIS — Z7901 Long term (current) use of anticoagulants: Secondary | ICD-10-CM | POA: Diagnosis not present

## 2021-07-26 DIAGNOSIS — I5043 Acute on chronic combined systolic (congestive) and diastolic (congestive) heart failure: Secondary | ICD-10-CM

## 2021-07-26 DIAGNOSIS — E782 Mixed hyperlipidemia: Secondary | ICD-10-CM | POA: Diagnosis not present

## 2021-07-26 DIAGNOSIS — M25571 Pain in right ankle and joints of right foot: Secondary | ICD-10-CM | POA: Diagnosis not present

## 2021-07-26 DIAGNOSIS — Z950 Presence of cardiac pacemaker: Secondary | ICD-10-CM | POA: Diagnosis not present

## 2021-07-26 DIAGNOSIS — I4892 Unspecified atrial flutter: Secondary | ICD-10-CM | POA: Diagnosis not present

## 2021-07-26 DIAGNOSIS — K589 Irritable bowel syndrome without diarrhea: Secondary | ICD-10-CM | POA: Diagnosis not present

## 2021-07-26 DIAGNOSIS — I5033 Acute on chronic diastolic (congestive) heart failure: Secondary | ICD-10-CM | POA: Diagnosis not present

## 2021-07-26 DIAGNOSIS — E538 Deficiency of other specified B group vitamins: Secondary | ICD-10-CM | POA: Diagnosis not present

## 2021-07-26 DIAGNOSIS — M79671 Pain in right foot: Secondary | ICD-10-CM | POA: Diagnosis not present

## 2021-07-26 DIAGNOSIS — D509 Iron deficiency anemia, unspecified: Secondary | ICD-10-CM | POA: Diagnosis not present

## 2021-07-26 DIAGNOSIS — R899 Unspecified abnormal finding in specimens from other organs, systems and tissues: Secondary | ICD-10-CM

## 2021-07-26 DIAGNOSIS — Z86718 Personal history of other venous thrombosis and embolism: Secondary | ICD-10-CM | POA: Diagnosis not present

## 2021-07-26 DIAGNOSIS — H409 Unspecified glaucoma: Secondary | ICD-10-CM | POA: Diagnosis not present

## 2021-07-26 DIAGNOSIS — M810 Age-related osteoporosis without current pathological fracture: Secondary | ICD-10-CM | POA: Diagnosis not present

## 2021-07-26 DIAGNOSIS — M1990 Unspecified osteoarthritis, unspecified site: Secondary | ICD-10-CM | POA: Diagnosis not present

## 2021-07-26 DIAGNOSIS — K3189 Other diseases of stomach and duodenum: Secondary | ICD-10-CM | POA: Diagnosis not present

## 2021-07-26 DIAGNOSIS — I495 Sick sinus syndrome: Secondary | ICD-10-CM | POA: Diagnosis not present

## 2021-07-26 DIAGNOSIS — I110 Hypertensive heart disease with heart failure: Secondary | ICD-10-CM | POA: Diagnosis not present

## 2021-07-26 DIAGNOSIS — I4819 Other persistent atrial fibrillation: Secondary | ICD-10-CM | POA: Diagnosis not present

## 2021-07-27 LAB — CBC WITH DIFFERENTIAL/PLATELET
Basophils Absolute: 0 10*3/uL (ref 0.0–0.2)
Basos: 1 %
EOS (ABSOLUTE): 0.2 10*3/uL (ref 0.0–0.4)
Eos: 4 %
Hematocrit: 32.1 % — ABNORMAL LOW (ref 34.0–46.6)
Hemoglobin: 10.8 g/dL — ABNORMAL LOW (ref 11.1–15.9)
Immature Grans (Abs): 0 10*3/uL (ref 0.0–0.1)
Immature Granulocytes: 1 %
Lymphocytes Absolute: 1.5 10*3/uL (ref 0.7–3.1)
Lymphs: 33 %
MCH: 31.1 pg (ref 26.6–33.0)
MCHC: 33.6 g/dL (ref 31.5–35.7)
MCV: 93 fL (ref 79–97)
Monocytes Absolute: 0.7 10*3/uL (ref 0.1–0.9)
Monocytes: 15 %
Neutrophils Absolute: 2.1 10*3/uL (ref 1.4–7.0)
Neutrophils: 46 %
Platelets: 167 10*3/uL (ref 150–450)
RBC: 3.47 x10E6/uL — ABNORMAL LOW (ref 3.77–5.28)
RDW: 18.2 % — ABNORMAL HIGH (ref 11.7–15.4)
WBC: 4.4 10*3/uL (ref 3.4–10.8)

## 2021-07-27 LAB — CMP14+EGFR
ALT: 15 IU/L (ref 0–32)
AST: 20 IU/L (ref 0–40)
Albumin/Globulin Ratio: 2 (ref 1.2–2.2)
Albumin: 4.4 g/dL (ref 3.5–4.6)
Alkaline Phosphatase: 69 IU/L (ref 44–121)
BUN/Creatinine Ratio: 15 (ref 12–28)
BUN: 13 mg/dL (ref 10–36)
Bilirubin Total: 0.6 mg/dL (ref 0.0–1.2)
CO2: 26 mmol/L (ref 20–29)
Calcium: 9.5 mg/dL (ref 8.7–10.3)
Chloride: 94 mmol/L — ABNORMAL LOW (ref 96–106)
Creatinine, Ser: 0.88 mg/dL (ref 0.57–1.00)
Globulin, Total: 2.2 g/dL (ref 1.5–4.5)
Glucose: 95 mg/dL (ref 70–99)
Potassium: 4.5 mmol/L (ref 3.5–5.2)
Sodium: 137 mmol/L (ref 134–144)
Total Protein: 6.6 g/dL (ref 6.0–8.5)
eGFR: 62 mL/min/{1.73_m2} (ref >59)

## 2021-08-02 ENCOUNTER — Ambulatory Visit (INDEPENDENT_AMBULATORY_CARE_PROVIDER_SITE_OTHER): Payer: Medicare Other

## 2021-08-02 DIAGNOSIS — I495 Sick sinus syndrome: Secondary | ICD-10-CM | POA: Diagnosis not present

## 2021-08-02 LAB — CUP PACEART REMOTE DEVICE CHECK
Battery Remaining Longevity: 1 mo
Battery Remaining Percentage: 0.5 %
Battery Voltage: 2.59 V
Brady Statistic AP VP Percent: 99 %
Brady Statistic AP VS Percent: 1 %
Brady Statistic AS VP Percent: 1 %
Brady Statistic AS VS Percent: 1 %
Brady Statistic RA Percent Paced: 99 %
Brady Statistic RV Percent Paced: 99 %
Date Time Interrogation Session: 20230221023314
Implantable Lead Implant Date: 20031028
Implantable Lead Implant Date: 20031028
Implantable Lead Location: 753859
Implantable Lead Location: 753860
Implantable Pulse Generator Implant Date: 20140107
Lead Channel Impedance Value: 390 Ohm
Lead Channel Impedance Value: 450 Ohm
Lead Channel Pacing Threshold Amplitude: 0.5 V
Lead Channel Pacing Threshold Amplitude: 0.75 V
Lead Channel Pacing Threshold Pulse Width: 0.5 ms
Lead Channel Pacing Threshold Pulse Width: 0.5 ms
Lead Channel Sensing Intrinsic Amplitude: 0.2 mV
Lead Channel Sensing Intrinsic Amplitude: 12 mV
Lead Channel Setting Pacing Amplitude: 2 V
Lead Channel Setting Pacing Amplitude: 2.5 V
Lead Channel Setting Pacing Pulse Width: 0.5 ms
Lead Channel Setting Sensing Sensitivity: 2 mV
Pulse Gen Model: 2210
Pulse Gen Serial Number: 7438567

## 2021-08-09 NOTE — Progress Notes (Signed)
Remote pacemaker transmission.   

## 2021-08-10 ENCOUNTER — Telehealth: Payer: Self-pay

## 2021-08-10 NOTE — Telephone Encounter (Signed)
[facility] alert, device has reached ERI 2/28 ? ?LOV 10/2020- Gen changeout as not discussed as battery life was >1 year (this was before the [facility] algorithm change) ? ?Spoke with pt, she denies any current cardiac symptoms today.  She does report that yesterday her head felt a little funny so she checked her BP frequently and it was fluctuating throughout the day, she also reports her HR was in the 50s according to her BP cuff.   ? ?Device continue to be programmed DDD 60 bpm despite reaching ERI.  Advised patient her symptoms yesterday may have been related to BP.  Advised ot continue monitoring and if she experiences any concerning symptoms to let us know.  ? ? ?Forwarding to Dr Rayann Heman and his nurse, Sonia Baller to determine if OV is needed or patient can be scheduled for procedure.   ? ? ? ? ?

## 2021-08-10 NOTE — Telephone Encounter (Signed)
Pt scheduled with Dr. Rayann Heman in [place] to discuss gen change. ?

## 2021-08-15 DIAGNOSIS — X32XXXD Exposure to sunlight, subsequent encounter: Secondary | ICD-10-CM | POA: Diagnosis not present

## 2021-08-15 DIAGNOSIS — I872 Venous insufficiency (chronic) (peripheral): Secondary | ICD-10-CM | POA: Diagnosis not present

## 2021-08-15 DIAGNOSIS — L570 Actinic keratosis: Secondary | ICD-10-CM | POA: Diagnosis not present

## 2021-08-15 DIAGNOSIS — I83009 Varicose veins of unspecified lower extremity with ulcer of unspecified site: Secondary | ICD-10-CM | POA: Diagnosis not present

## 2021-08-15 DIAGNOSIS — C44619 Basal cell carcinoma of skin of left upper limb, including shoulder: Secondary | ICD-10-CM | POA: Diagnosis not present

## 2021-08-16 ENCOUNTER — Encounter: Payer: Self-pay | Admitting: Nurse Practitioner

## 2021-08-16 ENCOUNTER — Ambulatory Visit: Payer: Medicare Other | Admitting: Family Medicine

## 2021-08-16 ENCOUNTER — Ambulatory Visit (INDEPENDENT_AMBULATORY_CARE_PROVIDER_SITE_OTHER): Payer: Medicare Other | Admitting: Nurse Practitioner

## 2021-08-16 VITALS — BP 120/77 | HR 54 | Temp 98.2°F | Ht 63.0 in | Wt 158.0 lb

## 2021-08-16 DIAGNOSIS — M62830 Muscle spasm of back: Secondary | ICD-10-CM | POA: Diagnosis not present

## 2021-08-16 MED ORDER — LIDOCAINE 5 % EX PTCH: 1.0000 | MEDICATED_PATCH | CUTANEOUS | 0 refills | Status: DC

## 2021-08-16 NOTE — Patient Instructions (Signed)
Muscle Pain, Adult ?Muscle pain, also called myalgia, is a condition in which a person has pain in one or more muscles in the body. Muscle pain may be mild, moderate, or severe. It may feel sharp, achy, or burning. In most cases, the pain lasts only a short time and goes away without treatment. ?Muscle pain can result from using muscles in a new or different way or after a period of inactivity. It is normal to feel some muscle pain after starting an exercise program. Muscles that have not been used often will be sore at first. ?What are the causes? ?This condition is caused by using muscles in a new or different way after a period of inactivity. Other causes may include: ?Overuse or muscle strain, especially if you are not in shape. This is the most common cause of muscle pain. ?Injury or bruising. ?Infectious diseases, including diseases caused by viruses, such as the flu (influenza). ?Fibromyalgia.This is a long-term, or chronic, condition that causes muscle tenderness, tiredness (fatigue), and headache. ?Autoimmune or rheumatologic diseases. These are conditions, such as lupus, in which the body's defense system (immunesystem) attacks areas in the body. ?Certain medicines, including ACE inhibitors and statins. ?What are the signs or symptoms? ?The main symptom of this condition is sore or painful muscles, including during activity and when stretching. You may also have slight swelling. ?How is this diagnosed? ?This condition is diagnosed with a physical exam. Your health care provider will ask questions about your pain and when it began. If you have not had muscle pain for very long, your health care provider may want to wait before doing much testing. If your muscle pain has lasted a long time, tests may be done right away. In some cases, this may include tests to rule out certain conditions or illnesses. ?How is this treated? ?Treatment for this condition depends on the cause. Home care is often enough to  relieve muscle pain. Your health care provider may also prescribe NSAIDs, such as ibuprofen. ?Follow these instructions at home: ?Medicines ?Take over-the-counter and prescription medicines only as told by your health care provider. ?Ask your health care provider if the medicine prescribed to you requires you to avoid driving or using machinery. ?Managing pain, swelling, and discomfort ?  ?If directed, put ice on the painful area. To do this: ?Put ice in a plastic bag. ?Place a towel between your skin and the bag. ?Leave the ice on for 20 minutes, 2-3 times a day. ?For the first 2 days of muscle soreness, or if there is swelling: ?Do not soak in hot baths. ?Do not use a hot tub, steam room, sauna, heating pad, or other heat source. ?After 48-72 hours, you may alternate between applying ice and applying heat as told by your health care provider. If directed, apply heat to the affected area as often as told by your health care provider. Use the heat source that your health care provider recommends, such as a moist heat pack or a heating pad. ?Place a towel between your skin and the heat source. ?Leave the heat on for 20-30 minutes. ?Remove the heat if your skin turns bright red. This is especially important if you are unable to feel pain, heat, or cold. You may have a greater risk of getting burned. ?If you have an injury, raise (elevate) the injured area above the level of your heart while you are sitting or lying down. ?Activity ? ?If overuse is causing your muscle pain: ?Slow down your activities   until the pain goes away. ?Do regular, gentle exercises if you are not usually active. ?Warm up before exercising. Stretch before and after exercising. This can help lower the risk of muscle pain. ?Do not continue working out if the pain is severe. Severe pain could mean that you have injured a muscle. ?Do not lift anything that is heavier than 5-10 lb (2.3-4.5 kg), or the limit that you are told, until your health care  provider says that it is safe. ?Return to your normal activities as told by your health care provider. Ask your health care provider what activities are safe for you. ?General instructions ?Do not use any products that contain nicotine or tobacco, such as cigarettes, e-cigarettes, and chewing tobacco. These can delay healing. If you need help quitting, ask your health care provider. ?Keep all follow-up visits as told by your health care provider. This is important. ?Contact a health care provider if you have: ?Muscle pain that gets worse and medicines do not help. ?Muscle pain that lasts longer than 3 days. ?A rash or fever along with muscle pain. ?Muscle pain after a tick bite. ?Muscle pain while working out, even though you are in good physical condition. ?Redness, soreness, or swelling along with muscle pain. ?Muscle pain after starting a new medicine or changing the dose of a medicine. ?Get help right away if you have: ?Trouble breathing. ?Trouble swallowing. ?Muscle pain along with a stiff neck, fever, and vomiting. ?Severe muscle weakness or you cannot move part of your body. ?These symptoms may represent a serious problem that is an emergency. Do not wait to see if the symptoms will go away. Get medical help right away. Call your local emergency services (911 in the [place] Do not drive yourself to the hospital. ?Summary ?Muscle pain usually lasts only a short time and goes away without treatment. ?This condition is caused by using muscles in a new or different way after a period of inactivity. ?If your muscle pain lasts longer than 3 days, tell your health care provider. ?This information is not intended to replace advice given to you by your health care provider. Make sure you discuss any questions you have with your health care provider. ?Document Revised: 12/17/2020 Document Reviewed: 12/17/2020 ?[employer]vier Patient Education ? 2022 [employer] ? ?

## 2021-08-16 NOTE — Progress Notes (Signed)
Acute Office Visit  Subjective:    Patient ID: [name], female    DOB: October 01, 1928, 88 y.o.   MRN: [id number]  Chief Complaint  Patient presents with   Muscle Pain    Muscle Pain This is a new problem. The current episode started yesterday. The problem occurs constantly. The problem is unchanged. Pain location: left flank /side. The pain is medium. The symptoms are aggravated by any movement. Pertinent negatives include no abdominal pain, chest pain, constipation, diarrhea, headaches, joint swelling, nausea or rash. Past treatments include acetaminophen. The treatment provided mild relief. There is no swelling present. She has been Behaving normally.    Past Medical History:  Diagnosis Date   Arthritis    Atrial flutter ([place])    s/p RFA 2003   Breast cancer ([place])    1980's - lumpectomy only   Colon polyps    Diverticulosis    DVT, lower extremity ([place]) 1998 and 1999   Bilateral   Essential hypertension    Gallstones    GERD (gastroesophageal reflux disease)    Glaucoma    IBS (irritable bowel syndrome)    Mixed hyperlipidemia    Osteoporosis    Persistent atrial fibrillation ([place])    Pneumonia 2011   Sick sinus syndrome ([facility])    PPM ([facility])   Urinary tract infection    Varicose veins     Past Surgical History:  Procedure Laterality Date   BIOPSY  07/04/2021   Procedure: BIOPSY;  Surgeon: Thornton Park, MD;  Location: [facility];  Service: Gastroenterology;;   BREAST LUMPECTOMY  [phone]   Right breast    CARDIAC CATHETERIZATION  1999   Normal coronaries   Cataracts Bilateral    CHOLECYSTECTOMY     COLONOSCOPY W/ BIOPSIES AND POLYPECTOMY  03/2003, 05/2008, 02/20/2011   severe diverticulosis, tubulovillous adenoma polyp, internal and external hemorrhoids 2012: severe diverticulosis, 4 mm polyp, hemorrhoids   CYST REMOVAL TRUNK     under breast = on belly    DILATION AND CURETTAGE OF UTERUS     ESOPHAGOGASTRODUODENOSCOPY (EGD) WITH PROPOFOL N/A  07/04/2021   Procedure: ESOPHAGOGASTRODUODENOSCOPY (EGD) WITH PROPOFOL;  Surgeon: Thornton Park, MD;  Location: [facility];  Service: Gastroenterology;  Laterality: N/A;   EYE SURGERY     cataracts - bilateral    Fractured wrist Left 2013   repair   INSERT / REPLACE / REMOVE PACEMAKER     KNEE ARTHROSCOPY WITH MEDIAL MENISECTOMY Right 10/21/2014   Procedure: RIGHT KNEE ARTHROSCOPY WITH MEDIAL MENISECTOM,lateral menisectomy,synovectomy suprapatellar pouch;  Surgeon: Latanya Maudlin, MD;  [facility];  Service: Orthopedics;  Laterality: Right;   PACEMAKER GENERATOR CHANGE  06/18/12   [facility] DR RF, Dr Rayann Heman   PACEMAKER INSERTION  2003   PERMANENT PACEMAKER GENERATOR CHANGE N/A 06/18/2012   Procedure: PERMANENT PACEMAKER GENERATOR CHANGE;  Surgeon: Thompson Grayer, MD;  Location: [facility] C[facility];  Service: Cardiovascular;  Laterality: N/A;   TOTAL KNEE ARTHROPLASTY  12/20/2011   Procedure: TOTAL KNEE ARTHROPLASTY;  Surgeon: Tobi Bastos, MD;  Location: [facility];  Service: Orthopedics;  Laterality: Left;   TOTAL KNEE ARTHROPLASTY Right 02/10/2015   Procedure: TOTAL RIGHT KNEE ARTHROPLASTY;  Surgeon: Latanya Maudlin, MD;  [facility];  Service: Orthopedics;  Laterality: Right;    Family History  Problem Relation Age of Onset   Throat cancer Father    Stroke Mother    Alzheimer's disease Mother    Alzheimer's disease Brother    Alzheimer's disease Sister  Colon cancer Maternal Grandfather    Stomach cancer Maternal Grandmother    Heart disease Paternal Grandmother    Heart attack Paternal Grandmother    Prostate cancer Son    Colon cancer Sister    Colon cancer Brother    Heart disease Brother    Atrial fibrillation Brother    Diabetes Brother    Dementia Brother    Parkinson's disease Brother    Prostate cancer Son     Social History   Socioeconomic History   Marital status: Widowed    Spouse name: Not on file   Number of children: 5   Years of education: 34    Highest education level: 12th grade  Occupational History   Occupation: Retired    Comment: resturant / cafe   Tobacco Use   Smoking status: Never   Smokeless tobacco: Never  Vaping Use   Vaping Use: Never used  Substance and Sexual Activity   Alcohol use: No    Alcohol/week: 0.0 standard drinks   Drug use: No   Sexual activity: Not on file  Other Topics Concern   Not on file  Social History Narrative   Widowed  - 5 sons = 1 lives local    Retired.   Never smoker no alcohol   Social Determinants of Radio broadcast assistant Strain: Low Risk    Difficulty of Paying Living Expenses: Not hard at all  Food Insecurity: No Food Insecurity   Worried About Charity fundraiser in the Last Year: Never true   [place] in the Last Year: Never true  Transportation Needs: No Transportation Needs   Lack of Transportation (Medical): No   Lack of Transportation (Non-Medical): No  Physical Activity: Inactive   Days of Exercise per Week: 0 days   Minutes of Exercise per Session: 0 min  Stress: No Stress Concern Present   Feeling of Stress : Not at all  Social Connections: Moderately Integrated   Frequency of Communication with Friends and Family: More than three times a week   Frequency of Social Gatherings with Friends and Family: More than three times a week   Attends Religious Services: More than 4 times per year   Active Member of [employer] or Organizations: Yes   Attends Archivist Meetings: More than 4 times per year   Marital Status: Widowed  Human resources officer Violence: Not At Risk   Fear of Current or Ex-Partner: No   Emotionally Abused: No   Physically Abused: No   Sexually Abused: No    Outpatient Medications Prior to Visit  Medication Sig Dispense Refill   acetaminophen (TYLENOL) 500 MG tablet Take 1,000 mg by mouth every 6 (six) hours as needed for mild pain.     amiodarone (PACERONE) 200 MG tablet TAKE ONE (1) TABLET EACH DAY (Patient taking differently:  Take 200 mg by mouth daily.) 90 tablet 0   apixaban (ELIQUIS) 5 MG TABS tablet Take 1 tablet (5 mg total) by mouth 2 (two) times daily. 180 tablet 1   calcium-vitamin D (OSCAL WITH D) 500-200 MG-UNIT tablet Take 1 tablet by mouth daily with breakfast.      cyanocobalamin (,VITAMIN B-12,) 1000 MCG/ML injection Inject 1 mL (1,000 mcg total) into the muscle every 30 (thirty) days. 3 mL 1   ferrous sulfate 325 (65 FE) MG tablet Take 1 tablet (325 mg total) by mouth 2 (two) times daily. 60 tablet 3   furosemide (LASIX) 20 MG tablet Take  2 tablets (40 mg total) by mouth daily. 60 tablet 1   latanoprost (XALATAN) 0.005 % ophthalmic solution Place 1 drop into both eyes at bedtime.     melatonin 5 MG TABS Take 5 mg by mouth at bedtime as needed (sleep).     Multiple Vitamin (MULTIVITAMIN) tablet Take 1 tablet by mouth every morning.     nadolol (CORGARD) 40 MG tablet Take 1.5 tablets (60 mg total) by mouth daily. 135 tablet 1   nitroGLYCERIN (NITROSTAT) 0.4 MG SL tablet DISSOLVE 1 TAB UNDER TOUNGE FOR CHEST PAIN. MAY REPEAT EVERY 5 MINUTES FOR 3 DOSES. IF NO RELIEF CALL 911 OR GO TO ER (Patient taking differently: Place 0.4 mg under the tongue every 5 (five) minutes as needed for chest pain.) 25 tablet 3   ondansetron (ZOFRAN-ODT) 4 MG disintegrating tablet TAKE 1 TABLET BY MOUTH EVERY 8 HOURS AS NEEDED FOR NAUSEA & VOMITING (Patient taking differently: Take 4 mg by mouth every 8 (eight) hours as needed for vomiting or nausea.) 20 tablet 0   pantoprazole (PROTONIX) 40 MG tablet TAKE ONE (1) TABLET EACH DAY (Patient taking differently: Take 40 mg by mouth daily.) 90 tablet 0   potassium chloride SA (KLOR-CON M) 20 MEQ tablet Take 1 tablet (20 mEq total) by mouth daily. 30 tablet 1   Wheat Dextrin (BENEFIBER) POWD Take 1 Dose by mouth daily as needed (constipation).     Facility-Administered Medications Prior to Visit  Medication Dose Route Frequency Provider Last Rate Last Admin   bupivacaine liposome  (EXPAREL) 1.3 % injection 266 mg  20 mL Infiltration Once Porterfield, Amber, PA-C        Allergies  Allergen Reactions   Iodinated Contrast Media Hives, Itching and Rash    03/13/14: symptoms began within two hours of intrathecal injection (myelogram 03/11/14); on shoulders radiating down to stomach.  Relief with Benadryl.  Told patient she would need to pre-med with Benadryl in the future before receiving this contrast.  Brita Romp, RN   Naproxen Hives, Itching and Rash    Review of Systems  Constitutional: Negative.   HENT: Negative.    Cardiovascular:  Negative for chest pain.  Gastrointestinal:  Negative for abdominal pain, constipation, diarrhea and nausea.  Musculoskeletal:  Positive for myalgias. Negative for joint swelling.  Skin:  Negative for rash.  Neurological:  Negative for headaches.  All other systems reviewed and are negative.     Objective:    Physical Exam Vitals and nursing note reviewed. Exam conducted with a chaperone present.  Constitutional:      Appearance: Normal appearance.  HENT:     Head: Normocephalic.     Right Ear: External ear normal.     Left Ear: External ear normal.     Nose: Nose normal.     Mouth/Throat:     Mouth: Mucous membranes are moist.     Pharynx: Oropharynx is clear.  Eyes:     Conjunctiva/sclera: Conjunctivae normal.  Cardiovascular:     Rate and Rhythm: Normal rate and regular rhythm.     Pulses: Normal pulses.     Heart sounds: Normal heart sounds.  Pulmonary:     Effort: Pulmonary effort is normal.     Breath sounds: Normal breath sounds.  Chest:       Comments: Left side pain  Abdominal:     General: Bowel sounds are normal.  Skin:    General: Skin is warm.     Findings: No rash.  Neurological:  General: No focal deficit present.     Mental Status: She is alert and oriented to person, place, and time.    BP 120/77    Pulse (!) 54    Temp 98.2 F (36.8 C)    Ht _0  (1.6 m)    Wt 158 lb (71.7 kg)    SpO2  98%    BMI 27.99 kg/m  Wt Readings from Last 3 Encounters:  08/16/21 158 lb (71.7 kg)  07/07/21 154 lb (69.9 kg)  07/04/21 156 lb 1.6 oz (70.8 kg)    There are no preventive care reminders to display for this patient.  There are no preventive care reminders to display for this patient.   Lab Results  Component Value Date   TSH 2.040 06/28/2021   Lab Results  Component Value Date   WBC 4.4 07/26/2021   HGB 10.8 (L) 07/26/2021   HCT 32.1 (L) 07/26/2021   MCV 93 07/26/2021   PLT 167 07/26/2021   Lab Results  Component Value Date   NA 137 07/26/2021   K 4.5 07/26/2021   CO2 26 07/26/2021   GLUCOSE 95 07/26/2021   BUN 13 07/26/2021   CREATININE 0.88 07/26/2021   BILITOT 0.6 07/26/2021   ALKPHOS 69 07/26/2021   AST 20 07/26/2021   ALT 15 07/26/2021   PROT 6.6 07/26/2021   ALBUMIN 4.4 07/26/2021   CALCIUM 9.5 07/26/2021   ANIONGAP 4 (L) 07/04/2021   EGFR 62 07/26/2021   GFR 82.62 06/07/2012   Lab Results  Component Value Date   CHOL 155 06/28/2021   Lab Results  Component Value Date   HDL 49 06/28/2021   Lab Results  Component Value Date   LDLCALC 88 06/28/2021   Lab Results  Component Value Date   TRIG 100 06/28/2021   Lab Results  Component Value Date   CHOLHDL 3.2 06/28/2021   Lab Results  Component Value Date   HGBA1C  12/08/2008    5.8 (NOTE) The ADA recommends the following therapeutic goal for glycemic control related to Hgb A1c measurement: Goal of therapy: <6.5 Hgb A1c  Reference: American Diabetes Association: Clinical Practice Recommendations 2010, Diabetes Care, 2010, 33: (Suppl  1).       Assessment & Plan:  Left-sided chest/flank pain not well controlled.  Patient pulled muscle while trying to clean her closet.  Apply ice/heat as tolerated, lidocaine 5% patch as needed.  Continue Tylenol as prescribed, rest sides, follow-up with worsening unresolved symptoms. Problem List Items Addressed This Visit       Other   Back muscle spasm -  Primary   Relevant Medications   lidocaine (LIDODERM) 5 %     Meds ordered this encounter  Medications   lidocaine (LIDODERM) 5 %    Sig: Place 1 patch onto the skin daily. Remove & Discard patch within 12 hours or as directed by MD    Dispense:  30 patch    Refill:  0    Order Specific Question:   Supervising Provider    Answer:   Jeneen Rinks     Ivy Lynn, NP

## 2021-08-19 ENCOUNTER — Encounter: Payer: Self-pay | Admitting: *Deleted

## 2021-08-19 ENCOUNTER — Telehealth: Payer: Self-pay | Admitting: Family Medicine

## 2021-08-19 ENCOUNTER — Encounter: Payer: Self-pay | Admitting: Internal Medicine

## 2021-08-19 ENCOUNTER — Ambulatory Visit: Payer: Medicare Other | Admitting: Internal Medicine

## 2021-08-19 ENCOUNTER — Other Ambulatory Visit: Payer: Self-pay

## 2021-08-19 VITALS — BP 130/78 | HR 56 | Ht 63.0 in | Wt 161.0 lb

## 2021-08-19 DIAGNOSIS — I4819 Other persistent atrial fibrillation: Secondary | ICD-10-CM

## 2021-08-19 DIAGNOSIS — Z01812 Encounter for preprocedural laboratory examination: Secondary | ICD-10-CM

## 2021-08-19 DIAGNOSIS — I495 Sick sinus syndrome: Secondary | ICD-10-CM | POA: Diagnosis not present

## 2021-08-19 DIAGNOSIS — I442 Atrioventricular block, complete: Secondary | ICD-10-CM | POA: Diagnosis not present

## 2021-08-19 DIAGNOSIS — Z79899 Other long term (current) drug therapy: Secondary | ICD-10-CM | POA: Diagnosis not present

## 2021-08-19 DIAGNOSIS — I10 Essential (primary) hypertension: Secondary | ICD-10-CM

## 2021-08-19 NOTE — Telephone Encounter (Signed)
Appt made w/ Dr. Warrick Parisian for documentation for order for lift chair ?

## 2021-08-19 NOTE — Progress Notes (Signed)
? ? ?PCP: Dettinger, Fransisca Kaufmann, MD ?Primary Cardiologist: Dr Domenic Polite ?Primary EP:  Dr Rayann Heman ? ?[name] is a 88 y.o. female who presents today for routine electrophysiology followup.  Since last being seen in our clinic, the patient reports doing very well.  She has stable SOB and edema chronically. Today, she denies symptoms of palpitations, chest pain,  dizziness, presyncope, or syncope.  The patient is otherwise without complaint today.  ? ?Past Medical History:  ?Diagnosis Date  ? Arthritis   ? Atrial flutter ([place])   ? s/p RFA 2003  ? Breast cancer ([place])   ? 1980's - lumpectomy only  ? Colon polyps   ? Diverticulosis   ? DVT, lower extremity ([place]) 1998 and 1999  ? Bilateral  ? Essential hypertension   ? Gallstones   ? GERD (gastroesophageal reflux disease)   ? Glaucoma   ? IBS (irritable bowel syndrome)   ? Mixed hyperlipidemia   ? Osteoporosis   ? Persistent atrial fibrillation ([place])   ? Pneumonia 2011  ? Sick sinus syndrome ([place])   ? PPM ([facility])  ? Urinary tract infection   ? Varicose veins   ? ?Past Surgical History:  ?Procedure Laterality Date  ? BIOPSY  07/04/2021  ? Procedure: BIOPSY;  Surgeon: Thornton Park, MD;  Location: [facility];  Service: Gastroenterology;;  ? BREAST LUMPECTOMY  [phone]  ? Right breast   ? CARDIAC CATHETERIZATION  1999  ? Normal coronaries  ? Cataracts Bilateral   ? CHOLECYSTECTOMY    ? COLONOSCOPY W/ BIOPSIES AND POLYPECTOMY  03/2003, 05/2008, 02/20/2011  ? severe diverticulosis, tubulovillous adenoma polyp, internal and external hemorrhoids 2012: severe diverticulosis, 4 mm polyp, hemorrhoids  ? CYST REMOVAL TRUNK    ? under breast = on belly   ? DILATION AND CURETTAGE OF UTERUS    ? ESOPHAGOGASTRODUODENOSCOPY (EGD) WITH PROPOFOL N/A 07/04/2021  ? Procedure: ESOPHAGOGASTRODUODENOSCOPY (EGD) WITH PROPOFOL;  Surgeon: Thornton Park, MD;  Location: [facility];  Service: Gastroenterology;  Laterality: N/A;  ? EYE SURGERY    ? cataracts - bilateral   ? Fractured wrist  Left 2013  ? repair  ? INSERT / REPLACE / REMOVE PACEMAKER    ? KNEE ARTHROSCOPY WITH MEDIAL MENISECTOMY Right 10/21/2014  ? Procedure: RIGHT KNEE ARTHROSCOPY WITH MEDIAL MENISECTOM,lateral menisectomy,synovectomy suprapatellar pouch;  Surgeon: Latanya Maudlin, MD;  [facility];  Service: Orthopedics;  Laterality: Right;  ? PACEMAKER GENERATOR CHANGE  06/18/12  ? [facility]JM [facility] DR RF, Dr Rayann Heman  ? PACEMAKER INSERTION  2003  ? PERMANENT PACEMAKER GENERATOR CHANGE N/A 06/18/2012  ? Procedure: PERMANENT PACEMAKER GENERATOR CHANGE;  Surgeon: Thompson Grayer, MD;  Location: [facility];  Service: Cardiovascular;  Laterality: N/A;  ? TOTAL KNEE ARTHROPLASTY  12/20/2011  ? Procedure: TOTAL KNEE ARTHROPLASTY;  Surgeon: Tobi Bastos, MD;  [facility];  Service: Orthopedics;  Laterality: Left;  ? TOTAL KNEE ARTHROPLASTY Right 02/10/2015  ? Procedure: TOTAL RIGHT KNEE ARTHROPLASTY;  Surgeon: Latanya Maudlin, MD;  [facility];  Service: Orthopedics;  Laterality: Right;  ? ? ?ROS- all systems are reviewed and negative except as per HPI above ? ?Current Outpatient Medications  ?Medication Sig Dispense Refill  ? acetaminophen (TYLENOL) 500 MG tablet Take 1,000 mg by mouth every 6 (six) hours as needed for mild pain.    ? amiodarone (PACERONE) 200 MG tablet TAKE ONE (1) TABLET EACH DAY (Patient taking differently: Take 200 mg by mouth daily.) 90 tablet 0  ? apixaban (ELIQUIS) 5 MG  TABS tablet Take 1 tablet (5 mg total) by mouth 2 (two) times daily. 180 tablet 1  ? calcium-vitamin D (OSCAL WITH D) 500-200 MG-UNIT tablet Take 1 tablet by mouth daily with breakfast.     ? cyanocobalamin (,VITAMIN B-12,) 1000 MCG/ML injection Inject 1 mL (1,000 mcg total) into the muscle every 30 (thirty) days. 3 mL 1  ? ferrous sulfate 325 (65 FE) MG tablet Take 1 tablet (325 mg total) by mouth 2 (two) times daily. 60 tablet 3  ? furosemide (LASIX) 20 MG tablet Take 2 tablets (40 mg total) by mouth daily. 60 tablet 1  ? latanoprost (XALATAN) 0.005  % ophthalmic solution Place 1 drop into both eyes at bedtime.    ? lidocaine (LIDODERM) 5 % Place 1 patch onto the skin daily. Remove & Discard patch within 12 hours or as directed by MD 30 patch 0  ? melatonin 5 MG TABS Take 5 mg by mouth at bedtime as needed (sleep).    ? Multiple Vitamin (MULTIVITAMIN) tablet Take 1 tablet by mouth every morning.    ? nadolol (CORGARD) 40 MG tablet Take 1.5 tablets (60 mg total) by mouth daily. 135 tablet 1  ? nitroGLYCERIN (NITROSTAT) 0.4 MG SL tablet DISSOLVE 1 TAB UNDER TOUNGE FOR CHEST PAIN. MAY REPEAT EVERY 5 MINUTES FOR 3 DOSES. IF NO RELIEF CALL 911 OR GO TO ER (Patient taking differently: Place 0.4 mg under the tongue every 5 (five) minutes as needed for chest pain.) 25 tablet 3  ? ondansetron (ZOFRAN-ODT) 4 MG disintegrating tablet TAKE 1 TABLET BY MOUTH EVERY 8 HOURS AS NEEDED FOR NAUSEA & VOMITING (Patient taking differently: Take 4 mg by mouth every 8 (eight) hours as needed for vomiting or nausea.) 20 tablet 0  ? pantoprazole (PROTONIX) 40 MG tablet TAKE ONE (1) TABLET EACH DAY (Patient taking differently: Take 40 mg by mouth daily.) 90 tablet 0  ? potassium chloride SA (KLOR-CON M) 20 MEQ tablet Take 1 tablet (20 mEq total) by mouth daily. 30 tablet 1  ? Wheat Dextrin (BENEFIBER) POWD Take 1 Dose by mouth daily as needed (constipation).    ? ?No current facility-administered medications for this visit.  ? ?Facility-Administered Medications Ordered in Other Visits  ?Medication Dose Route Frequency Provider Last Rate Last Admin  ? bupivacaine liposome (EXPAREL) 1.3 % injection 266 mg  20 mL Infiltration Once Porterfield, [employer], PA-C      ? ? ?Physical Exam: ?Vitals:  ? 08/19/21 1128  ?BP: 130/78  ?Pulse: (!) 56  ?Weight: 161 lb (73 kg)  ?Height: '5\' 3"'$  (1.6 m)  ? ? ?GEN- The patient is well appearing, alert and oriented x 3 today.   ?Head- normocephalic, atraumatic ?Eyes-  Sclera clear, conjunctiva pink ?Ears- hearing intact ?Oropharynx- clear ?Lungs- Clear to  ausculation bilaterally, normal work of breathing ?Chest- pacemaker pocket is well healed ?Heart- Regular rate and rhythm, no murmurs, rubs or gallops, PMI not laterally displaced ?GI- soft, NT, ND, + BS ?Extremities- no clubbing, cyanosis, or edema ? ?Pacemaker interrogation- reviewed in detail today,  See PACEART report ?  ?Assessment and Plan: ? ?1. Symptomatic sinus bradycardia and complete heart block ?Normal pacemaker function but has reached ERI ?See [name] Art report ?No changes today ?she is device dependant today ?Risks, benefits, and alternatives to PPM pulse generator replacement were discussed in detail today.  The patient understands that risks include but are not limited to bleeding, infection, pneumothorax, perforation, tamponade, vascular damage, renal failure, MI, stroke, death, damage to  his existing leads, and lead dislodgement and wishes to proceed.  We will therefore schedule the procedure at the next available time. ?Hold eliquis 24 hours prior to the procedure ? ?2. Persistent atrial fibrillation ?Well controlled on amiodarone (burden <1% by PPM)  ?Labs 07/26/22 reviewed ?Hold eliquis 24 hours prior to generator change ?Check lfts, tfts ? ?3. HTN ?Stable ?No change required today ? ?  ? ?Thompson Grayer MD, [facility] ?08/19/2021 ?11:33 AM ? ? ?

## 2021-08-19 NOTE — Patient Instructions (Addendum)
Medication Instructions:  ?Continue all current medications.   ? ?Labwork: ?BMET, CBC, TSH, LFT - orders given today  ? ?Testing/Procedures: ?Generator change out on your pacemaker  ? ?Follow-Up: ?Will be given at time of discharge from procedure  ? ?Any Other Special Instructions Will Be Listed Below (If Applicable). ? ? ?If you need a refill on your cardiac medications before your next appointment, please call your pharmacy. ? ?

## 2021-08-23 ENCOUNTER — Telehealth: Payer: Self-pay | Admitting: *Deleted

## 2021-08-23 NOTE — Telephone Encounter (Signed)
[name] ?Key: [id number] ?PA Case ID: [id number] ?Rx #: [id number] ? ?Status: ?Sent to Plan today ? ?Drug ?Lidocaine 5% patches ?

## 2021-08-23 NOTE — Telephone Encounter (Signed)
APPROVED

## 2021-08-29 ENCOUNTER — Encounter: Payer: Self-pay | Admitting: Family Medicine

## 2021-08-29 ENCOUNTER — Ambulatory Visit: Payer: Medicare Other | Admitting: Family Medicine

## 2021-09-02 ENCOUNTER — Ambulatory Visit (INDEPENDENT_AMBULATORY_CARE_PROVIDER_SITE_OTHER): Payer: Medicare Other

## 2021-09-02 DIAGNOSIS — I495 Sick sinus syndrome: Secondary | ICD-10-CM

## 2021-09-05 LAB — CUP PACEART REMOTE DEVICE CHECK
Battery Remaining Longevity: 0 mo
Battery Voltage: 2.57 V
Brady Statistic AP VP Percent: 97 %
Brady Statistic AP VS Percent: 2.3 %
Brady Statistic AS VP Percent: 0 %
Brady Statistic AS VS Percent: 0 %
Brady Statistic RA Percent Paced: 99 %
Brady Statistic RV Percent Paced: 97 %
Date Time Interrogation Session: 20230324034423
Implantable Lead Implant Date: 20031028
Implantable Lead Implant Date: 20031028
Implantable Lead Location: 753859
Implantable Lead Location: 753860
Implantable Pulse Generator Implant Date: 20140107
Lead Channel Impedance Value: 350 Ohm
Lead Channel Impedance Value: 460 Ohm
Lead Channel Pacing Threshold Amplitude: 0.75 V
Lead Channel Pacing Threshold Amplitude: 0.75 V
Lead Channel Pacing Threshold Pulse Width: 0.5 ms
Lead Channel Pacing Threshold Pulse Width: 0.5 ms
Lead Channel Sensing Intrinsic Amplitude: 0.2 mV
Lead Channel Sensing Intrinsic Amplitude: 8.1 mV
Lead Channel Setting Pacing Amplitude: 2 V
Lead Channel Setting Pacing Amplitude: 2.5 V
Lead Channel Setting Pacing Pulse Width: 0.5 ms
Lead Channel Setting Sensing Sensitivity: 2 mV
Pulse Gen Model: 2210
Pulse Gen Serial Number: 7438567

## 2021-09-06 NOTE — Addendum Note (Signed)
Addended by: Douglass Rivers D on: 09/06/2021 05:06 PM ? ? Modules accepted: Level of Service ? ?

## 2021-09-06 NOTE — Progress Notes (Signed)
Remote pacemaker transmission.   

## 2021-09-14 ENCOUNTER — Ambulatory Visit (INDEPENDENT_AMBULATORY_CARE_PROVIDER_SITE_OTHER): Payer: Medicare Other | Admitting: Family Medicine

## 2021-09-14 ENCOUNTER — Encounter: Payer: Self-pay | Admitting: Family Medicine

## 2021-09-14 VITALS — BP 143/74 | HR 55 | Ht 63.0 in | Wt 173.0 lb

## 2021-09-14 DIAGNOSIS — K219 Gastro-esophageal reflux disease without esophagitis: Secondary | ICD-10-CM | POA: Diagnosis not present

## 2021-09-14 DIAGNOSIS — E782 Mixed hyperlipidemia: Secondary | ICD-10-CM

## 2021-09-14 DIAGNOSIS — D509 Iron deficiency anemia, unspecified: Secondary | ICD-10-CM | POA: Diagnosis not present

## 2021-09-14 DIAGNOSIS — Z79899 Other long term (current) drug therapy: Secondary | ICD-10-CM

## 2021-09-14 DIAGNOSIS — I10 Essential (primary) hypertension: Secondary | ICD-10-CM

## 2021-09-14 DIAGNOSIS — I480 Paroxysmal atrial fibrillation: Secondary | ICD-10-CM | POA: Diagnosis not present

## 2021-09-14 MED ORDER — PANTOPRAZOLE SODIUM 40 MG PO TBEC: 40.0000 mg | DELAYED_RELEASE_TABLET | Freq: Every day | ORAL | 1 refills | Status: DC

## 2021-09-14 MED ORDER — AMIODARONE HCL 200 MG PO TABS: 200.0000 mg | ORAL_TABLET | Freq: Every day | ORAL | 1 refills | Status: DC

## 2021-09-14 NOTE — Progress Notes (Signed)
? ?BP (!) 143/74   Pulse (!) 55   Ht _0  (1.6 m)   Wt 173 lb (78.5 kg)   SpO2 99%   BMI 30.65 kg/m?   ? ?Subjective:  ? ?Patient ID: [name], female    DOB: 1929/01/01, 88 y.o.   MRN: [id number] ? ?HPI: ?[name] is a 88 y.o. female presenting on 09/14/2021 for Medical Management of Chronic Issues, Hyperlipidemia, and Hypertension ? ? ?HPI ?Hypertension and CHF ?Patient is currently on furosemide and amiodarone and nadolol, and their blood pressure today is 143/74. Patient denies any lightheadedness or dizziness. Patient denies headaches, blurred vision, chest pains, shortness of breath, or weakness. Denies any side effects from medication and is content with current medication.  She weighs himself daily and says that it has been pretty stable. ? ?Hyperlipidemia ?Patient is coming in for recheck of his hyperlipidemia. The patient is currently taking no medicine currently, monitoring and diet control. They deny any issues with myalgias or history of liver damage from it. They deny any focal numbness or weakness or chest pain.  ? ?GERD ?Patient is currently on pantoprazole.  She denies any major symptoms or abdominal pain or belching or burping. She denies any blood in her stool or lightheadedness or dizziness.  ? ?Patient has A-fib and takes Eliquis and amiodarone and sees cardiology. ? ?Anemia recheck ?Patient has been slightly anemic but has been stable, will recheck blood work today ? ?Relevant past medical, surgical, family and social history reviewed and updated as indicated. Interim medical history since our last visit reviewed. ?Allergies and medications reviewed and updated. ? ?Review of Systems  ?Constitutional:  Negative for chills and fever.  ?HENT:  Negative for congestion, ear discharge and ear pain.   ?Eyes:  Negative for visual disturbance.  ?Respiratory:  Negative for chest tightness, shortness of breath and wheezing.   ?Cardiovascular:  Negative for chest pain and leg  swelling.  ?Genitourinary:  Negative for difficulty urinating and dysuria.  ?Musculoskeletal:  Negative for back pain and gait problem.  ?Skin:  Negative for rash.  ?Neurological:  Negative for dizziness, light-headedness and headaches.  ?Psychiatric/Behavioral:  Negative for agitation and behavioral problems.   ?All other systems reviewed and are negative. ? ?Per HPI unless specifically indicated above ? ? ?Allergies as of 09/14/2021   ? ?   Reactions  ? Iodinated Contrast Media Hives, Itching, Rash  ? 03/13/14: symptoms began within two hours of intrathecal injection (myelogram 03/11/14); on shoulders radiating down to stomach.  Relief with Benadryl.  Told patient she would need to pre-med with Benadryl in the future before receiving this contrast.  Brita Romp, RN  ? Naproxen Hives, Itching, Rash  ? ?  ? ?  ?Medication List  ?  ? ?  ? Accurate as of September 14, 2021  9:55 AM. If you have any questions, ask your nurse or doctor.  ?  ?  ? ?  ? ?STOP taking these medications   ? ?lidocaine 5 % ?Commonly known as: Lidoderm ?Stopped by: Worthy Rancher, MD ?  ?ondansetron 4 MG disintegrating tablet ?Commonly known as: ZOFRAN-ODT ?Stopped by: Worthy Rancher, MD ?  ? ?  ? ?TAKE these medications   ? ?acetaminophen 500 MG tablet ?Commonly known as: TYLENOL ?Take 1,000 mg by mouth every 6 (six) hours as needed for mild pain. ?  ?amiodarone 200 MG tablet ?Commonly known as: PACERONE ?Take 1 tablet (200 mg total) by mouth daily. ?What changed: See  the new instructions. ?Changed by: Worthy Rancher, MD ?  ?apixaban 5 MG Tabs tablet ?Commonly known as: Eliquis ?Take 1 tablet (5 mg total) by mouth 2 (two) times daily. ?  ?Benefiber Powd ?Take 1 Dose by mouth daily as needed (constipation). ?  ?calcium-vitamin D 500-200 MG-UNIT tablet ?Commonly known as: OSCAL WITH D ?Take 1 tablet by mouth daily with breakfast. ?  ?cyanocobalamin 1000 MCG/ML injection ?Commonly known as: (VITAMIN B-12) ?Inject 1 mL (1,000 mcg total) into  the muscle every 30 (thirty) days. ?  ?FeroSul 325 (65 FE) MG tablet ?Generic drug: ferrous sulfate ?Take 1 tablet (325 mg total) by mouth 2 (two) times daily. ?  ?furosemide 20 MG tablet ?Commonly known as: LASIX ?Take 2 tablets (40 mg total) by mouth daily. ?  ?latanoprost 0.005 % ophthalmic solution ?Commonly known as: XALATAN ?Place 1 drop into both eyes at bedtime. ?  ?melatonin 5 MG Tabs ?Take 5 mg by mouth at bedtime as needed (sleep). ?  ?multivitamin tablet ?Take 1 tablet by mouth every morning. ?  ?nadolol 40 MG tablet ?Commonly known as: CORGARD ?Take 1.5 tablets (60 mg total) by mouth daily. ?  ?nitroGLYCERIN 0.4 MG SL tablet ?Commonly known as: NITROSTAT ?DISSOLVE 1 TAB UNDER TOUNGE FOR CHEST PAIN. MAY REPEAT EVERY 5 MINUTES FOR 3 DOSES. IF NO RELIEF CALL 911 OR GO TO ER ?What changed: See the new instructions. ?  ?pantoprazole 40 MG tablet ?Commonly known as: PROTONIX ?Take 1 tablet (40 mg total) by mouth daily. ?What changed: See the new instructions. ?Changed by: Worthy Rancher, MD ?  ?potassium chloride SA 20 MEQ tablet ?Commonly known as: KLOR-CON M ?Take 1 tablet (20 mEq total) by mouth daily. ?  ? ?  ? ? ? ?Objective:  ? ?BP (!) 143/74   Pulse (!) 55   Ht _0  (1.6 m)   Wt 173 lb (78.5 kg)   SpO2 99%   BMI 30.65 kg/m?   ?Wt Readings from Last 3 Encounters:  ?09/14/21 173 lb (78.5 kg)  ?08/19/21 161 lb (73 kg)  ?08/16/21 158 lb (71.7 kg)  ?  ?Physical Exam ?Vitals and nursing note reviewed.  ?Constitutional:   ?   General: She is not in acute distress. ?   Appearance: She is well-developed. She is not diaphoretic.  ?Eyes:  ?   Conjunctiva/sclera: Conjunctivae normal.  ?   Pupils: Pupils are equal, round, and reactive to light.  ?Cardiovascular:  ?   Rate and Rhythm: Normal rate and regular rhythm.  ?   Heart sounds: Normal heart sounds. No murmur heard. ?Pulmonary:  ?   Effort: Pulmonary effort is normal. No respiratory distress.  ?   Breath sounds: Rales (Right basilar, small amount)  present. No wheezing.  ?Musculoskeletal:     ?   General: No swelling or tenderness. Normal range of motion.  ?Skin: ?   General: Skin is warm and dry.  ?   Findings: No lesion or rash.  ?Neurological:  ?   Mental Status: She is alert and oriented to person, place, and time.  ?   Coordination: Coordination normal.  ?Psychiatric:     ?   Behavior: Behavior normal.  ? ? ? ? ?Assessment & Plan:  ? ?Problem List Items Addressed This Visit   ? ?  ? Cardiovascular and Mediastinum  ? Essential hypertension, benign (Chronic)  ? Relevant Medications  ? amiodarone (PACERONE) 200 MG tablet  ? Other Relevant Orders  ? CBC with Differential/Platelet  ?  CMP14+EGFR  ? Paroxysmal atrial fibrillation (HCC)  ? Relevant Medications  ? amiodarone (PACERONE) 200 MG tablet  ? Other Relevant Orders  ? CBC with Differential/Platelet  ?  ? Digestive  ? GERD (gastroesophageal reflux disease)  ? Relevant Medications  ? pantoprazole (PROTONIX) 40 MG tablet  ? Other Relevant Orders  ? CBC with Differential/Platelet  ?  ? Other  ? Hyperlipidemia - Primary (Chronic)  ? Relevant Medications  ? amiodarone (PACERONE) 200 MG tablet  ? Other Relevant Orders  ? CMP14+EGFR  ? Lipid panel  ? Iron deficiency anemia (Chronic)  ? Relevant Orders  ? CBC with Differential/Platelet  ? ?Other Visit Diagnoses   ? ? Long term current use of amiodarone      ? Relevant Orders  ? TSH  ? ?  ?  ?Continue current medicine, will check blood work.  No changes, continues to follow with cardiology as well. ?Follow up plan: ?Return in about 6 months (around 03/16/2022), or if symptoms worsen or fail to improve, for Hypertension and cholesterol recheck. ? ?Counseling provided for all of the vaccine components ?Orders Placed This Encounter  ?Procedures  ? CBC with Differential/Platelet  ? CMP14+EGFR  ? Lipid panel  ? TSH  ? ? ?Caryl Pina, MD ?[place] ?09/14/2021, 9:55 AM ? ? ? ? ?

## 2021-09-15 LAB — LIPID PANEL
Chol/HDL Ratio: 3.1 ratio (ref 0.0–4.4)
Cholesterol, Total: 173 mg/dL (ref 100–199)
HDL: 55 mg/dL (ref >39)
LDL Chol Calc (NIH): 109 mg/dL — ABNORMAL HIGH (ref 0–99)
Triglycerides: 42 mg/dL (ref 0–149)
VLDL Cholesterol Cal: 9 mg/dL (ref 5–40)

## 2021-09-15 LAB — CMP14+EGFR
ALT: 17 IU/L (ref 0–32)
AST: 18 IU/L (ref 0–40)
Albumin/Globulin Ratio: 1.9 (ref 1.2–2.2)
Albumin: 4.4 g/dL (ref 3.5–4.6)
Alkaline Phosphatase: 73 IU/L (ref 44–121)
BUN/Creatinine Ratio: 19 (ref 12–28)
BUN: 19 mg/dL (ref 10–36)
Bilirubin Total: 0.6 mg/dL (ref 0.0–1.2)
CO2: 29 mmol/L (ref 20–29)
Calcium: 9.9 mg/dL (ref 8.7–10.3)
Chloride: 97 mmol/L (ref 96–106)
Creatinine, Ser: 1.02 mg/dL — ABNORMAL HIGH (ref 0.57–1.00)
Globulin, Total: 2.3 g/dL (ref 1.5–4.5)
Glucose: 95 mg/dL (ref 70–99)
Potassium: 4.1 mmol/L (ref 3.5–5.2)
Sodium: 138 mmol/L (ref 134–144)
Total Protein: 6.7 g/dL (ref 6.0–8.5)
eGFR: 52 mL/min/{1.73_m2} — ABNORMAL LOW (ref >59)

## 2021-09-15 LAB — CBC WITH DIFFERENTIAL/PLATELET
Basophils Absolute: 0 10*3/uL (ref 0.0–0.2)
Basos: 1 %
EOS (ABSOLUTE): 0.2 10*3/uL (ref 0.0–0.4)
Eos: 4 %
Hematocrit: 32.5 % — ABNORMAL LOW (ref 34.0–46.6)
Hemoglobin: 11.4 g/dL (ref 11.1–15.9)
Immature Grans (Abs): 0 10*3/uL (ref 0.0–0.1)
Immature Granulocytes: 0 %
Lymphocytes Absolute: 1.5 10*3/uL (ref 0.7–3.1)
Lymphs: 33 %
MCH: 32.3 pg (ref 26.6–33.0)
MCHC: 35.1 g/dL (ref 31.5–35.7)
MCV: 92 fL (ref 79–97)
Monocytes Absolute: 0.7 10*3/uL (ref 0.1–0.9)
Monocytes: 15 %
Neutrophils Absolute: 2.2 10*3/uL (ref 1.4–7.0)
Neutrophils: 47 %
Platelets: 163 10*3/uL (ref 150–450)
RBC: 3.53 x10E6/uL — ABNORMAL LOW (ref 3.77–5.28)
RDW: 16.4 % — ABNORMAL HIGH (ref 11.7–15.4)
WBC: 4.6 10*3/uL (ref 3.4–10.8)

## 2021-09-15 LAB — TSH: TSH: 1.25 u[IU]/mL (ref 0.450–4.500)

## 2021-09-26 ENCOUNTER — Other Ambulatory Visit (HOSPITAL_COMMUNITY)
Admission: RE | Admit: 2021-09-26 | Discharge: 2021-09-26 | Disposition: A | Discharge status: Home / Self Care | Payer: Medicare Other | Source: Ambulatory Visit | Attending: Internal Medicine | Admitting: Internal Medicine

## 2021-09-26 DIAGNOSIS — Z01812 Encounter for preprocedural laboratory examination: Secondary | ICD-10-CM

## 2021-09-26 DIAGNOSIS — Z79899 Other long term (current) drug therapy: Secondary | ICD-10-CM | POA: Diagnosis not present

## 2021-09-26 DIAGNOSIS — I10 Essential (primary) hypertension: Secondary | ICD-10-CM

## 2021-09-26 LAB — CBC
HCT: 34.1 % — ABNORMAL LOW (ref 36.0–46.0)
Hemoglobin: 11.4 g/dL — ABNORMAL LOW (ref 12.0–15.0)
MCH: 32.9 pg (ref 26.0–34.0)
MCHC: 33.4 g/dL (ref 30.0–36.0)
MCV: 98.3 fL (ref 80.0–100.0)
Platelets: 143 10*3/uL — ABNORMAL LOW (ref 150–400)
RBC: 3.47 MIL/uL — ABNORMAL LOW (ref 3.87–5.11)
RDW: 17.1 % — ABNORMAL HIGH (ref 11.5–15.5)
WBC: 5 10*3/uL (ref 4.0–10.5)
nRBC: 0 % (ref 0.0–0.2)

## 2021-09-26 LAB — HEPATIC FUNCTION PANEL
ALT: 20 U/L (ref 0–44)
AST: 23 U/L (ref 15–41)
Albumin: 4.1 g/dL (ref 3.5–5.0)
Alkaline Phosphatase: 64 U/L (ref 38–126)
Bilirubin, Direct: 0.1 mg/dL (ref 0.0–0.2)
Indirect Bilirubin: 0.4 mg/dL (ref 0.3–0.9)
Total Bilirubin: 0.5 mg/dL (ref 0.3–1.2)
Total Protein: 7.4 g/dL (ref 6.5–8.1)

## 2021-09-26 LAB — BASIC METABOLIC PANEL
Anion gap: 6 (ref 5–15)
BUN: 17 mg/dL (ref 8–23)
CO2: 29 mmol/L (ref 22–32)
Calcium: 9.4 mg/dL (ref 8.9–10.3)
Chloride: 99 mmol/L (ref 98–111)
Creatinine, Ser: 0.9 mg/dL (ref 0.44–1.00)
GFR, Estimated: >60 mL/min (ref >60)
Glucose, Bld: 110 mg/dL — ABNORMAL HIGH (ref 70–99)
Potassium: 4.4 mmol/L (ref 3.5–5.1)
Sodium: 134 mmol/L — ABNORMAL LOW (ref 135–145)

## 2021-09-26 LAB — TSH: TSH: 1.368 u[IU]/mL (ref 0.350–4.500)

## 2021-09-28 DIAGNOSIS — I872 Venous insufficiency (chronic) (peripheral): Secondary | ICD-10-CM | POA: Diagnosis not present

## 2021-09-28 DIAGNOSIS — I83009 Varicose veins of unspecified lower extremity with ulcer of unspecified site: Secondary | ICD-10-CM | POA: Diagnosis not present

## 2021-09-28 NOTE — Pre-Procedure Instructions (Signed)
Instructed patient on the following items: ?Arrival time 1400 ?Nothing to eat or drink after midnight ?No meds AM of procedure ?Responsible person to drive you home and stay with you for 24 hrs ?Wash with special soap night before and morning of procedure ?If on anti-coagulant drug instructions Eliquis-last dose Thursday morning 4/20  ?

## 2021-09-30 ENCOUNTER — Ambulatory Visit: Payer: Medicare Other | Admitting: Cardiology

## 2021-09-30 ENCOUNTER — Ambulatory Visit (HOSPITAL_COMMUNITY)
Admission: RE | Admit: 2021-09-30 | Discharge: 2021-09-30 | Disposition: A | Discharge status: Home / Self Care | Payer: Medicare Other | Attending: Internal Medicine | Admitting: Internal Medicine

## 2021-09-30 ENCOUNTER — Encounter (HOSPITAL_COMMUNITY)
Admission: RE | Disposition: A | Discharge status: Home / Self Care | Payer: Medicare Other | Source: Home / Self Care | Attending: Internal Medicine

## 2021-09-30 DIAGNOSIS — Z45010 Encounter for checking and testing of cardiac pacemaker pulse generator [battery]: Secondary | ICD-10-CM | POA: Insufficient documentation

## 2021-09-30 DIAGNOSIS — I442 Atrioventricular block, complete: Secondary | ICD-10-CM | POA: Insufficient documentation

## 2021-09-30 DIAGNOSIS — I495 Sick sinus syndrome: Secondary | ICD-10-CM

## 2021-09-30 DIAGNOSIS — I480 Paroxysmal atrial fibrillation: Secondary | ICD-10-CM | POA: Diagnosis not present

## 2021-09-30 HISTORY — PX: PPM GENERATOR CHANGEOUT: EP1233

## 2021-09-30 SURGERY — PPM GENERATOR CHANGEOUT
Anesthesia: LOCAL

## 2021-09-30 MED ORDER — SODIUM CHLORIDE 0.9 % IV SOLN: INTRAVENOUS | Status: DC

## 2021-09-30 MED ORDER — CEFAZOLIN SODIUM-DEXTROSE 2-4 GM/100ML-% IV SOLN
2.0000 g | INTRAVENOUS | Status: AC
  Administered 2021-09-30: 2 g via INTRAVENOUS
  Filled 2021-09-30 (×2): qty 100

## 2021-09-30 MED ORDER — ONDANSETRON HCL 4 MG/2ML IJ SOLN: 4.0000 mg | Freq: Four times a day (QID) | INTRAMUSCULAR | Status: DC | PRN

## 2021-09-30 MED ORDER — ACETAMINOPHEN 325 MG PO TABS: 325.0000 mg | ORAL_TABLET | ORAL | Status: DC | PRN

## 2021-09-30 MED ORDER — CHLORHEXIDINE GLUCONATE 4 % EX LIQD
4.0000 "application " | Freq: Once | CUTANEOUS | Status: DC
  Filled 2021-09-30: qty 60

## 2021-09-30 MED ORDER — SODIUM CHLORIDE 0.9 % IV SOLN: 250.0000 mL | INTRAVENOUS | Status: DC | PRN

## 2021-09-30 MED ORDER — LIDOCAINE HCL (PF) 1 % IJ SOLN
INTRAMUSCULAR | Status: DC | PRN
  Administered 2021-09-30: 60 mL

## 2021-09-30 MED ORDER — LIDOCAINE HCL (PF) 1 % IJ SOLN
INTRAMUSCULAR | Status: AC
  Filled 2021-09-30: qty 30

## 2021-09-30 MED ORDER — LIDOCAINE HCL (PF) 1 % IJ SOLN
INTRAMUSCULAR | Status: AC
Start: 2021-09-30 — End: ?
  Filled 2021-09-30: qty 30

## 2021-09-30 MED ORDER — SODIUM CHLORIDE 0.9% FLUSH: 3.0000 mL | INTRAVENOUS | Status: DC | PRN

## 2021-09-30 MED ORDER — SODIUM CHLORIDE 0.9 % IV SOLN
80.0000 mg | INTRAVENOUS | Status: AC
  Administered 2021-09-30: 80 mg
  Filled 2021-09-30: qty 2

## 2021-09-30 MED ORDER — SODIUM CHLORIDE 0.9 % IV SOLN
INTRAVENOUS | Status: AC
  Filled 2021-09-30: qty 2

## 2021-09-30 MED ORDER — SODIUM CHLORIDE 0.9% FLUSH: 3.0000 mL | Freq: Two times a day (BID) | INTRAVENOUS | Status: DC

## 2021-09-30 SURGICAL SUPPLY — 4 items
CABLE SURGICAL S-101-97-12 (CABLE) ×2 IMPLANT
PACEMAKER ASSURITY DR-RF (Pacemaker) ×1 IMPLANT
PAD DEFIB RADIO PHYSIO CONN (PAD) ×2 IMPLANT
TRAY PACEMAKER INSERTION (PACKS) ×2 IMPLANT

## 2021-09-30 NOTE — H&P (Signed)
PCP: Dettinger, Fransisca Kaufmann, MD ?Primary Cardiologist: Dr Domenic Polite ?Primary EP:  Dr Rayann Heman ?  ?[name] is a 88 y.o. female who presents today for pacemaker generator change.  Since last being seen in our clinic, the patient reports doing very well.  She has stable SOB and edema chronically. Today, she denies symptoms of palpitations, chest pain,  dizziness, presyncope, or syncope.  The patient is otherwise without complaint today.  ?  ?    ?Past Medical History:  ?Diagnosis Date  ? Arthritis    ? Atrial flutter ([place])    ?  s/p RFA 2003  ? Breast cancer ([place])    ?  1980's - lumpectomy only  ? Colon polyps    ? Diverticulosis    ? DVT, lower extremity ([place]) 1998 and 1999  ?  Bilateral  ? Essential hypertension    ? Gallstones    ? GERD (gastroesophageal reflux disease)    ? Glaucoma    ? IBS (irritable bowel syndrome)    ? Mixed hyperlipidemia    ? Osteoporosis    ? Persistent atrial fibrillation ([place])    ? Pneumonia 2011  ? Sick sinus syndrome ([place])    ?  PPM ([facility])  ? Urinary tract infection    ? Varicose veins    ?  ?     ?Past Surgical History:  ?Procedure Laterality Date  ? BIOPSY   07/04/2021  ?  Procedure: BIOPSY;  Surgeon: Thornton Park, MD;  Location: [facility];  Service: Gastroenterology;;  ? BREAST LUMPECTOMY   [phone]  ?  Right breast   ? CARDIAC CATHETERIZATION   1999  ?  Normal coronaries  ? Cataracts Bilateral    ? CHOLECYSTECTOMY      ? COLONOSCOPY W/ BIOPSIES AND POLYPECTOMY   03/2003, 05/2008, 02/20/2011  ?  severe diverticulosis, tubulovillous adenoma polyp, internal and external hemorrhoids 2012: severe diverticulosis, 4 mm polyp, hemorrhoids  ? CYST REMOVAL TRUNK      ?  under breast = on belly   ? DILATION AND CURETTAGE OF UTERUS      ? ESOPHAGOGASTRODUODENOSCOPY (EGD) WITH PROPOFOL N/A 07/04/2021  ?  Procedure: ESOPHAGOGASTRODUODENOSCOPY (EGD) WITH PROPOFOL;  Surgeon: Thornton Park, MD;  Location: [facility];  Service: Gastroenterology;  Laterality: N/A;  ? EYE SURGERY      ?   cataracts - bilateral   ? Fractured wrist Left 2013  ?  repair  ? INSERT / REPLACE / REMOVE PACEMAKER      ? KNEE ARTHROSCOPY WITH MEDIAL MENISECTOMY Right 10/21/2014  ?  Procedure: RIGHT KNEE ARTHROSCOPY WITH MEDIAL MENISECTOM,lateral menisectomy,synovectomy suprapatellar pouch;  Surgeon: Latanya Maudlin, MD;  Location: [facility];  Service: Orthopedics;  Laterality: Right;  ? PACEMAKER GENERATOR CHANGE   06/18/12  ?  [facility] DR RF, Dr Rayann Heman  ? PACEMAKER INSERTION   2003  ? PERMANENT PACEMAKER GENERATOR CHANGE N/A 06/18/2012  ?  Procedure: PERMANENT PACEMAKER GENERATOR CHANGE;  Surgeon: Thompson Grayer, MD;  Location[facility];  Service: Cardiovascular;  Laterality: N/A;  ? TOTAL KNEE ARTHROPLASTY   12/20/2011  ?  Procedure: TOTAL KNEE ARTHROPLASTY;  Surgeon: Tobi Bastos, MD;  Location: [facility];  Service: Orthopedics;  Laterality: Left;  ? TOTAL KNEE ARTHROPLASTY Right 02/10/2015  ?  Procedure: TOTAL RIGHT KNEE ARTHROPLASTY;  Surgeon: Latanya Maudlin, MD;  Location: [facility];  Service: Orthopedics;  Laterality: Right;  ?  ?  ?ROS- all systems are reviewed and negative except as per HPI above ?  ?      ?  Current Outpatient Medications  ?Medication Sig Dispense Refill  ? acetaminophen (TYLENOL) 500 MG tablet Take 1,000 mg by mouth every 6 (six) hours as needed for mild pain.      ? amiodarone (PACERONE) 200 MG tablet TAKE ONE (1) TABLET EACH DAY (Patient taking differently: Take 200 mg by mouth daily.) 90 tablet 0  ? apixaban (ELIQUIS) 5 MG TABS tablet Take 1 tablet (5 mg total) by mouth 2 (two) times daily. 180 tablet 1  ? calcium-vitamin D (OSCAL WITH D) 500-200 MG-UNIT tablet Take 1 tablet by mouth daily with breakfast.       ? cyanocobalamin (,VITAMIN B-12,) 1000 MCG/ML injection Inject 1 mL (1,000 mcg total) into the muscle every 30 (thirty) days. 3 mL 1  ? ferrous sulfate 325 (65 FE) MG tablet Take 1 tablet (325 mg total) by mouth 2 (two) times daily. 60 tablet 3  ? furosemide (LASIX) 20 MG tablet Take 2 tablets (40 mg  total) by mouth daily. 60 tablet 1  ? latanoprost (XALATAN) 0.005 % ophthalmic solution Place 1 drop into both eyes at bedtime.      ? lidocaine (LIDODERM) 5 % Place 1 patch onto the skin daily. Remove & Discard patch within 12 hours or as directed by MD 30 patch 0  ? melatonin 5 MG TABS Take 5 mg by mouth at bedtime as needed (sleep).      ? Multiple Vitamin (MULTIVITAMIN) tablet Take 1 tablet by mouth every morning.      ? nadolol (CORGARD) 40 MG tablet Take 1.5 tablets (60 mg total) by mouth daily. 135 tablet 1  ? nitroGLYCERIN (NITROSTAT) 0.4 MG SL tablet DISSOLVE 1 TAB UNDER TOUNGE FOR CHEST PAIN. MAY REPEAT EVERY 5 MINUTES FOR 3 DOSES. IF NO RELIEF CALL 911 OR GO TO ER (Patient taking differently: Place 0.4 mg under the tongue every 5 (five) minutes as needed for chest pain.) 25 tablet 3  ? ondansetron (ZOFRAN-ODT) 4 MG disintegrating tablet TAKE 1 TABLET BY MOUTH EVERY 8 HOURS AS NEEDED FOR NAUSEA & VOMITING (Patient taking differently: Take 4 mg by mouth every 8 (eight) hours as needed for vomiting or nausea.) 20 tablet 0  ? pantoprazole (PROTONIX) 40 MG tablet TAKE ONE (1) TABLET EACH DAY (Patient taking differently: Take 40 mg by mouth daily.) 90 tablet 0  ? potassium chloride SA (KLOR-CON M) 20 MEQ tablet Take 1 tablet (20 mEq total) by mouth daily. 30 tablet 1  ? [place] Dextrin (BENEFIBER) POWD Take 1 Dose by mouth daily as needed (constipation).      ?  ?No current facility-administered medications for this visit.  ?  ?         ?Facility-Administered Medications Ordered in Other Visits  ?Medication Dose Route Frequency Provider Last Rate Last Admin  ? bupivacaine liposome (EXPAREL) 1.3 % injection 266 mg  20 mL Infiltration Once [place], [employer], PA-C      ?  ?  ?Physical Exam: ?Vitals:  ? 09/30/21 1449  ?BP: 131/69  ?Pulse: (!) 55  ?Temp: 98 ?F (36.7 ?C)  ?SpO2: 95%  ? ?GEN- The patient is well appearing, alert and oriented x 3 today.   ?Head- normocephalic, atraumatic ?Eyes-  Sclera clear,  conjunctiva pink ?Ears- hearing intact ?Oropharynx- clear ?Lungs- Clear to ausculation bilaterally, normal work of breathing ?Chest- pacemaker pocket is well healed ?Heart- Regular rate and rhythm, no murmurs, rubs or gallops, PMI not laterally displaced ?GI- soft, NT, ND, + BS ?Extremities- no clubbing, cyanosis, or edema ?  ?  Pacemaker interrogation- reviewed in detail today,  See PACEART report ?  ?Assessment and Plan: ?  ?1. Symptomatic sinus bradycardia and complete heart block ?Normal pacemaker function but has reached ERI ?See [name] Art report ?No changes today ?she is device dependant today ? ?Risks, benefits, and alternatives to PPM pulse generator replacement were discussed in detail today.  The patient understands that risks include but are not limited to bleeding, infection, pneumothorax, perforation, tamponade, vascular damage, renal failure, MI, stroke, death, damage to his existing leads, and lead dislodgement and wishes to proceed.  We will therefore schedule the procedure at the next available time. ? ?Thompson Grayer MD, [place] ?09/30/2021 ?3:25 PM ? ? ?

## 2021-09-30 NOTE — Discharge Instructions (Addendum)
Resume eliquis on Monday 10/02/21 pm ? ?Implantable Cardiac Device Battery Change, Care After ? ?This sheet gives you information about how to care for yourself after your procedure. Your health care provider may also give you more specific instructions. If you have problems or questions, contact your health care provider. ?What can I expect after the procedure? ?After your procedure, it is common to have: ?Pain or soreness at the site where the cardiac device was inserted. ?Swelling at the site where the cardiac device was inserted. ?You should received an information card for your new device in 4-8 weeks. ?Follow these instructions at home: ?Incision care  ?Keep the incision clean and dry. ?Do not take baths, swim, or use a hot tub until after your wound check.  ?Do not shower for at least 7 days, or as directed by your health care provider. ?Pat the area dry with a clean towel. Do not rub the area. This may cause bleeding. ?Follow instructions from your health care provider about how to take care of your incision. Make sure you: ?Leave stitches (sutures), skin glue, or adhesive strips in place. These skin closures may need to stay in place for 2 weeks or longer. If adhesive strip edges start to loosen and curl up, you may trim the loose edges. Do not remove adhesive strips completely unless your health care provider tells you to do that. ?Check your incision area every day for signs of infection. Check for: ?More redness, swelling, or pain. ?More fluid or blood. ?Warmth. ?Pus or a bad smell. ?Activity ?Do not lift anything that is heavier than 10 lb (4.5 kg) until your health care provider says it is okay to do so. ?For the first week, or as long as told by your health care provider: ?Avoid lifting your affected arm higher than your shoulder. ?After 1 week, Be gentle when you move your arms over your head. It is okay to raise your arm to comb your hair. ?Avoid strenuous exercise. ?Ask your health care provider  when it is okay to: ?Resume your normal activities. ?Return to work or school. ?Resume sexual activity. ?Eating and drinking ?Eat a heart-healthy diet. This should include plenty of fresh fruits and vegetables, whole grains, low-fat dairy products, and lean protein like chicken and fish. ?Limit alcohol intake to no more than 1 drink a day for non-pregnant women and 2 drinks a day for men. One drink equals 12 oz of beer, 5 oz of wine, or 1? oz of hard liquor. ?Check ingredients and nutrition facts on packaged foods and beverages. Avoid the following types of food: ?Food that is high in salt (sodium). ?Food that is high in saturated fat, like full-fat dairy or red meat. ?Food that is high in trans fat, like fried food. ?Food and drinks that are high in sugar. ?Lifestyle ?Do not use any products that contain nicotine or tobacco, such as cigarettes and e-cigarettes. If you need help quitting, ask your health care provider. ?Take steps to manage and control your weight. ?Once cleared, get regular exercise. Aim for 150 minutes of moderate-intensity exercise (such as walking or yoga) or 75 minutes of vigorous exercise (such as running or swimming) each week. ?Manage other health problems, such as diabetes or high blood pressure. Ask your health care provider how you can manage these conditions. ?General instructions ?Do not drive for 24 hours after your procedure if you were given a medicine to help you relax (sedative). ?Take over-the-counter and prescription medicines only as told by  your health care provider. ?Avoid putting pressure on the area where the cardiac device was placed. ?If you need an MRI after your cardiac device has been placed, be sure to tell the health care provider who orders the MRI that you have a cardiac device. ?Avoid close and prolonged exposure to electrical devices that have strong magnetic fields. These include: ?Cell phones. Avoid keeping them in a pocket near the cardiac device, and try using  the ear opposite the cardiac device. ?MP3 players. ?Household appliances, like microwaves. ?Metal detectors. ?Electric generators. ?High-tension wires. ?Keep all follow-up visits as directed by your health care provider. This is important. ?Contact a health care provider if: ?You have pain at the incision site that is not relieved by over-the-counter or prescription medicines. ?You have any of these around your incision site or coming from it: ?More redness, swelling, or pain. ?Fluid or blood. ?Warmth to the touch. ?Pus or a bad smell. ?You have a fever. ?You feel brief, occasional palpitations, light-headedness, or any symptoms that you think might be related to your heart. ?Get help right away if: ?You experience chest pain that is different from the pain at the cardiac device site. ?You develop a red streak that extends above or below the incision site. ?You experience shortness of breath. ?You have palpitations or an irregular heartbeat. ?You have light-headedness that does not go away quickly. ?You faint or have dizzy spells. ?Your pulse suddenly drops or increases rapidly and does not return to normal. ?You begin to gain weight and your legs and ankles swell. ?Summary ?After your procedure, it is common to have pain, soreness, and some swelling where the cardiac device was inserted. ?Make sure to keep your incision clean and dry. Follow instructions from your health care provider about how to take care of your incision. ?Check your incision every day for signs of infection, such as more pain or swelling, pus or a bad smell, warmth, or leaking fluid and blood. ?Avoid strenuous exercise and lifting your left arm higher than your shoulder for 2 weeks, or as long as told by your health care provider. ?This information is not intended to replace advice given to you by your health care provider. Make sure you discuss any questions you have with your health care provider.  ?

## 2021-10-03 ENCOUNTER — Ambulatory Visit (INDEPENDENT_AMBULATORY_CARE_PROVIDER_SITE_OTHER): Payer: Medicare Other

## 2021-10-03 ENCOUNTER — Telehealth: Payer: Self-pay | Admitting: Internal Medicine

## 2021-10-03 ENCOUNTER — Encounter (HOSPITAL_COMMUNITY): Payer: Self-pay | Admitting: Internal Medicine

## 2021-10-03 DIAGNOSIS — I495 Sick sinus syndrome: Secondary | ICD-10-CM

## 2021-10-03 LAB — CUP PACEART REMOTE DEVICE CHECK
Battery Remaining Longevity: 112 mo
Battery Remaining Percentage: 95.5 %
Battery Voltage: 3.11 V
Brady Statistic AP VP Percent: 99 %
Brady Statistic AP VS Percent: 1 %
Brady Statistic AS VP Percent: 0 %
Brady Statistic AS VS Percent: 0 %
Brady Statistic RA Percent Paced: 99 %
Brady Statistic RV Percent Paced: 99 %
Date Time Interrogation Session: 20230424020012
Implantable Pulse Generator Implant Date: 20140107
Lead Channel Impedance Value: 350 Ohm
Lead Channel Impedance Value: 430 Ohm
Lead Channel Pacing Threshold Amplitude: 0.5 V
Lead Channel Pacing Threshold Amplitude: 0.5 V
Lead Channel Pacing Threshold Pulse Width: 0.5 ms
Lead Channel Pacing Threshold Pulse Width: 0.5 ms
Lead Channel Setting Pacing Amplitude: 0.75 V
Lead Channel Setting Pacing Amplitude: 2 V
Lead Channel Setting Pacing Pulse Width: 0.5 ms
Lead Channel Setting Sensing Sensitivity: 4 mV
Pulse Gen Model: 2210
Pulse Gen Serial Number: 7438567

## 2021-10-03 NOTE — Telephone Encounter (Signed)
Patient had gen change on 09/30/21. Reports of red rash noted across chest, neck and down left breast. States she has small tiny bumps also. States she has been taking benadryl and topical hydrocortisone, has noted some improvement. Denies any airway concerns or shortness of breath/difficulty breathing.  Patient will attempt to send a picture this afternoon when someone is able to help her.  Offered device clinic apt tomorrow (no provider in office this afternoon), patient declined, stated she wanted to continue her medicine at home, she thinks it will continue to get better per patient. Patient advised if she were to have any sensations in her throat, swelling of her throat or difficulty breathing to call 911 and go to the ED. Patient voiced understanding. Will await picture at this time. ?

## 2021-10-03 NOTE — Telephone Encounter (Signed)
New Message: ? ? ? ? ?Patient said she had a pacemaker put in on Friday. She said on Saturday she noticed a rash on it on Saturday, it is still there.  ?

## 2021-10-04 ENCOUNTER — Other Ambulatory Visit: Payer: Self-pay | Admitting: Family Medicine

## 2021-10-05 ENCOUNTER — Encounter: Payer: Self-pay | Admitting: Family Medicine

## 2021-10-05 ENCOUNTER — Ambulatory Visit (INDEPENDENT_AMBULATORY_CARE_PROVIDER_SITE_OTHER): Payer: Medicare Other | Admitting: Family Medicine

## 2021-10-05 VITALS — BP 97/67 | HR 61 | Temp 98.2°F | Ht 63.0 in | Wt 152.1 lb

## 2021-10-05 DIAGNOSIS — I10 Essential (primary) hypertension: Secondary | ICD-10-CM

## 2021-10-05 DIAGNOSIS — R21 Rash and other nonspecific skin eruption: Secondary | ICD-10-CM | POA: Diagnosis not present

## 2021-10-05 MED ORDER — TRIAMCINOLONE ACETONIDE 0.5 % EX OINT: 1.0000 "application " | TOPICAL_OINTMENT | Freq: Two times a day (BID) | CUTANEOUS | 0 refills | Status: DC

## 2021-10-05 MED ORDER — LEVOCETIRIZINE DIHYDROCHLORIDE 5 MG PO TABS: 2.5000 mg | ORAL_TABLET | Freq: Every evening | ORAL | 0 refills | Status: DC

## 2021-10-05 NOTE — Telephone Encounter (Signed)
Reviewed with Oda Kilts, PA. Advised patient to continue to hydrocortisone topically.  Spoke to patient and reports rash has worsened. States she is going to call PCP to get apt. Advised to call if she has further questions or concerns.  ?

## 2021-10-05 NOTE — Patient Instructions (Signed)
Contact Dermatitis Dermatitis is redness, soreness, and swelling (inflammation) of the skin. Contact dermatitis is a reaction to certain substances that touch the skin. Many different substances can cause contact dermatitis. There are two types of contact dermatitis: Irritant contact dermatitis. This type is caused by something that irritates your skin, such as having dry hands from washing them too often with soap. This type does not require previous exposure to the substance for a reaction to occur. This is the most common type. Allergic contact dermatitis. This type is caused by a substance that you are allergic to, such as poison ivy. This type occurs when you have been exposed to the substance (allergen) and develop a sensitivity to it. Dermatitis may develop soon after your first exposure to the allergen, or it may not develop until the next time you are exposed and every time thereafter. What are the causes? Irritant contact dermatitis is most commonly caused by exposure to: Makeup. Soaps. Detergents. Bleaches. Acids. Metal salts, such as nickel. Allergic contact dermatitis is most commonly caused by exposure to: Poisonous plants. Chemicals. Jewelry. Latex. Medicines. Preservatives in products, such as clothing. What increases the risk? You are more likely to develop this condition if you have: A job that exposes you to irritants or allergens. Certain medical conditions, such as asthma or eczema. What are the signs or symptoms? Symptoms of this condition may occur on your body anywhere the irritant has touched you or is touched by you. Symptoms include: Dryness or flaking. Redness. Cracks. Itching. Pain or a burning feeling. Blisters. Drainage of small amounts of blood or clear fluid from skin cracks. With allergic contact dermatitis, there may also be swelling in areas such as the eyelids, mouth, or genitals. How is this diagnosed? This condition is diagnosed with a medical  history and physical exam. A patch skin test may be performed to help determine the cause. If the condition is related to your job, you may need to see an occupational medicine specialist. How is this treated? This condition is treated by checking for the cause of the reaction and protecting your skin from further contact. Treatment may also include: Steroid creams or ointments. Oral steroid medicines may be needed in more severe cases. Antibiotic medicines or antibacterial ointments, if a skin infection is present. Antihistamine lotion or an antihistamine taken by mouth to ease itching. A bandage (dressing). Follow these instructions at home: Skin care Moisturize your skin as needed. Apply cool compresses to the affected areas. Try applying baking soda paste to your skin. Stir water into baking soda until it reaches a paste-like consistency. Do not scratch your skin, and avoid friction to the affected area. Avoid the use of soaps, perfumes, and dyes. Medicines Take or apply over-the-counter and prescription medicines only as told by your health care provider. If you were prescribed an antibiotic medicine, take or apply the antibiotic as told by your health care provider. Do not stop using the antibiotic even if your condition improves. Bathing Try taking a bath with: Epsom salts. Follow the instructions on the packaging. You can get these at your local pharmacy or grocery store. Baking soda. Pour a small amount into the bath as directed by your health care provider. Colloidal oatmeal. Follow the instructions on the packaging. You can get this at your local pharmacy or grocery store. Bathe less frequently, such as every other day. Bathe in lukewarm water. Avoid using hot water. Bandage care If you were given a bandage (dressing), change it as told   by your health care provider. Wash your hands with soap and water before and after you change your dressing. If soap and water are not  available, use hand sanitizer. General instructions Avoid the substance that caused your reaction. If you do not know what caused it, keep a journal to try to track what caused it. Write down: What you eat. What cosmetic products you use. What you drink. What you wear in the affected area. This includes jewelry. Check the affected areas every day for signs of infection. Check for: More redness, swelling, or pain. More fluid or blood. Warmth. Pus or a bad smell. Keep all follow-up visits as told by your health care provider. This is important. Contact a health care provider if: Your condition does not improve with treatment. Your condition gets worse. You have signs of infection such as swelling, tenderness, redness, soreness, or warmth in the affected area. You have a fever. You have new symptoms. Get help right away if: You have a severe headache, neck pain, or neck stiffness. You vomit. You feel very sleepy. You notice red streaks coming from the affected area. Your bone or joint underneath the affected area becomes painful after the skin has healed. The affected area turns darker. You have difficulty breathing. Summary Dermatitis is redness, soreness, and swelling (inflammation) of the skin. Contact dermatitis is a reaction to certain substances that touch the skin. Symptoms of this condition may occur on your body anywhere the irritant has touched you or is touched by you. This condition is treated by figuring out what caused the reaction and protecting your skin from further contact. Treatment may also include medicines and skin care. Avoid the substance that caused your reaction. If you do not know what caused it, keep a journal to try to track what caused it. Contact a health care provider if your condition gets worse or you have signs of infection such as swelling, tenderness, redness, soreness, or warmth in the affected area. This information is not intended to replace  advice given to you by your health care provider. Make sure you discuss any questions you have with your health care provider. Document Revised: 03/14/2021 Document Reviewed: 03/14/2021 Elsevier Patient Education  2023 [employer]  

## 2021-10-05 NOTE — Progress Notes (Signed)
? ?Acute Office Visit ? ?Subjective:  ? ?  ?Patient ID: [name], female    DOB: 03-Aug-1928, 88 y.o.   MRN: [id number] ? ?Chief Complaint  ?Patient presents with  ? Rash  ? ? ?HPI ?Here with son today. Patient is in today for a rash. The rash presented on Sunday. It is red, bumpy, and itchy. The rash is on her chest, shoulders, upper back, and down her left arm. The rash has gotten worse on her left arm. She denies fever or drainage. Denies angioedema, wheezing, or shortness of breath. She denies new products, food, or medications. Denies dizziness or lightheadedness. She had a placemaker placed on Friday. She was prepped with betadine and wondered if this was the cause of her symptoms. She contacted the cardiologist office and reports that they told her it was not likely a reaction to the betadine due to the location. She has not been doing yard work or had exposure to poison ivy or oak. She has been taking benadryl and using hydrocortisone cream with some improvement.  ? ?She will see podiatry tomorrow for a steroid shot in her heel.  ? ?BP Readings from Last 3 Encounters:  ?10/05/21 97/67  ?09/30/21 (!) 130/107  ?09/14/21 (!) 143/74  ? ? ?ROS ?As per HPI.  ? ?   ?Objective:  ?  ?BP 97/67   Pulse 61   Temp 98.2 ?F (36.8 ?C) (Temporal)   Ht '5\' 3"'$  (1.6 m)   Wt 152 lb 2 oz (69 kg)   BMI 26.95 kg/m?  ?BP Readings from Last 3 Encounters:  ?10/05/21 97/67  ?09/30/21 (!) 130/107  ?09/14/21 (!) 143/74  ? ?  ? ?Physical Exam ?Vitals and nursing note reviewed.  ?Constitutional:   ?   General: She is not in acute distress. ?   Appearance: Normal appearance. She is not ill-appearing, toxic-appearing or diaphoretic.  ?HENT:  ?   Mouth/Throat:  ?   Mouth: No angioedema.  ?Cardiovascular:  ?   Rate and Rhythm: Normal rate and regular rhythm.  ?   Heart sounds: Normal heart sounds. No murmur heard. ?Pulmonary:  ?   Effort: Pulmonary effort is normal. No respiratory distress.  ?   Breath sounds: Normal breath  sounds.  ?Musculoskeletal:  ?   Right lower leg: No edema.  ?   Left lower leg: No edema.  ?Skin: ?   General: Skin is warm and dry.  ?   Findings: Rash (erythematous papular rash to bilateral chest, upper back, and left arm. Rash does cross the midline. No signs of infection) present.  ?Neurological:  ?   Mental Status: She is alert and oriented to person, place, and time.  ?Psychiatric:     ?   Mood and Affect: Mood normal.     ?   Behavior: Behavior normal.  ? ? ?No results found for any visits on 10/05/21. ? ? ?   ?Assessment & Plan:  ? ?[name] was seen today for rash. ? ?Diagnoses and all orders for this visit: ? ?Rash ?Consistent with contact or irritant dermatitis, unsure of cause. Kenalog cream ordered. Take xyzal instead of benadryl as benadryl does make patient drowsy.  ?-     triamcinolone ointment (KENALOG) 0.5 %; Apply 1 application. topically 2 (two) times daily. ?-     levocetirizine (XYZAL) 5 MG tablet; Take 0.5 tablets (2.5 mg total) by mouth every evening. ? ?Essential hypertension, benign ?Soft BP today. Denies dizziness or lightheaded. ? Due to benadryl  use. Instructed to hold benadryl. Monitor BP at home. Notify for persistent lows or if dizziness or lightheadedness occurs.  ? ? ?Return if symptoms worsen or fail to improve. ? ?The patient indicates understanding of these issues and agrees with the plan. ? ? ?Gwenlyn Perking, FNP ? ? ?

## 2021-10-06 DIAGNOSIS — M79671 Pain in right foot: Secondary | ICD-10-CM | POA: Diagnosis not present

## 2021-10-06 DIAGNOSIS — M25571 Pain in right ankle and joints of right foot: Secondary | ICD-10-CM | POA: Diagnosis not present

## 2021-10-12 ENCOUNTER — Ambulatory Visit (INDEPENDENT_AMBULATORY_CARE_PROVIDER_SITE_OTHER): Payer: Medicare Other

## 2021-10-12 DIAGNOSIS — I495 Sick sinus syndrome: Secondary | ICD-10-CM | POA: Diagnosis not present

## 2021-10-12 LAB — CUP PACEART INCLINIC DEVICE CHECK
Battery Remaining Longevity: 105 mo
Battery Voltage: 3.13 V
Brady Statistic RA Percent Paced: 96 %
Brady Statistic RV Percent Paced: 96 %
Date Time Interrogation Session: 20230503104840
Implantable Lead Implant Date: 20031028
Implantable Lead Implant Date: 20031028
Implantable Lead Location: 753859
Implantable Lead Location: 753860
Implantable Pulse Generator Implant Date: 20230421
Lead Channel Impedance Value: 387.5 Ohm
Lead Channel Impedance Value: 487.5 Ohm
Lead Channel Pacing Threshold Amplitude: 0.5 V
Lead Channel Pacing Threshold Amplitude: 0.5 V
Lead Channel Pacing Threshold Amplitude: 0.625 V
Lead Channel Pacing Threshold Pulse Width: 0.5 ms
Lead Channel Pacing Threshold Pulse Width: 0.5 ms
Lead Channel Pacing Threshold Pulse Width: 0.5 ms
Lead Channel Sensing Intrinsic Amplitude: 8.4 mV
Lead Channel Setting Pacing Amplitude: 0.875
Lead Channel Setting Pacing Amplitude: 2 V
Lead Channel Setting Pacing Pulse Width: 0.5 ms
Lead Channel Setting Sensing Sensitivity: 4 mV
Pulse Gen Model: 2272
Pulse Gen Serial Number: 8074688

## 2021-10-12 NOTE — Patient Instructions (Addendum)

## 2021-10-12 NOTE — Progress Notes (Signed)
Wound check appointment. Steri-strips removed. Wound without redness or edema. Incision edges approximated, wound well healed. Normal device function. Thresholds, sensing, and impedances consistent with implant measurements. Device programmed at chronic outputs, no new leads. Histogram distribution appropriate for patient and level of activity. No mode switches or high ventricular rates noted. Patient educated about wound care, arm mobility. ROV in 3 months with implanting physician. ?

## 2021-10-18 ENCOUNTER — Ambulatory Visit: Payer: Medicare Other

## 2021-10-18 NOTE — Progress Notes (Signed)
Remote pacemaker transmission.   

## 2021-10-21 ENCOUNTER — Encounter: Payer: Medicare Other | Admitting: Internal Medicine

## 2021-10-24 DIAGNOSIS — I872 Venous insufficiency (chronic) (peripheral): Secondary | ICD-10-CM | POA: Diagnosis not present

## 2021-10-26 ENCOUNTER — Encounter: Payer: Self-pay | Admitting: Nurse Practitioner

## 2021-10-26 ENCOUNTER — Ambulatory Visit (INDEPENDENT_AMBULATORY_CARE_PROVIDER_SITE_OTHER): Payer: Medicare Other | Admitting: Nurse Practitioner

## 2021-10-26 VITALS — BP 111/63 | HR 61 | Ht 63.0 in | Wt 155.2 lb

## 2021-10-26 DIAGNOSIS — T7840XA Allergy, unspecified, initial encounter: Secondary | ICD-10-CM

## 2021-10-26 MED ORDER — PREDNISONE 5 MG PO TABS: 5.0000 mg | ORAL_TABLET | Freq: Every day | ORAL | 0 refills | Status: DC

## 2021-10-26 MED ORDER — HYDROCORTISONE 1 % EX LOTN
1.0000 | TOPICAL_LOTION | Freq: Two times a day (BID) | CUTANEOUS | 0 refills | Status: DC
Start: 2021-10-26 — End: 2022-01-26

## 2021-10-26 NOTE — Progress Notes (Signed)
? ?Acute Office Visit ? ?Subjective:  ? ?  ?Patient ID: [name], female    DOB: Sep 18, 1928, 88 y.o.   MRN: [id number] ? ?Chief Complaint  ?Patient presents with  ? itching  ?  From the waist up , mostly in the chest   ? ? ?Rash ?This is a recurrent problem. The current episode started 1 to 4 weeks ago. The problem is unchanged. The affected locations include the abdomen, left hand and right hand. The rash is characterized by blistering, dryness, redness and itchiness. She was exposed to nothing. Pertinent negatives include no cough or sore throat. Past treatments include nothing.  ? ? ?Review of Systems  ?Constitutional: Negative.   ?HENT: Negative.  Negative for sore throat.   ?Respiratory:  Negative for cough.   ?Genitourinary: Negative.   ?Skin:  Positive for rash.  ?All other systems reviewed and are negative. ? ? ?   ?Objective:  ?  ?BP 111/63   Pulse 61   Ht '5\' 3"'$  (1.6 m)   Wt 155 lb 3.2 oz (70.4 kg)   SpO2 97%   BMI 27.49 kg/m?  ?BP Readings from Last 3 Encounters:  ?10/26/21 111/63  ?10/05/21 97/67  ?09/30/21 (!) 130/107  ? ?Wt Readings from Last 3 Encounters:  ?10/26/21 155 lb 3.2 oz (70.4 kg)  ?10/05/21 152 lb 2 oz (69 kg)  ?09/30/21 155 lb (70.3 kg)  ? ?  ? ?Physical Exam ?Vitals and nursing note reviewed.  ?Constitutional:   ?   Appearance: Normal appearance.  ?HENT:  ?   Head: Normocephalic.  ?   Right Ear: External ear normal.  ?   Left Ear: External ear normal.  ?   Nose: Nose normal. No congestion.  ?   Mouth/Throat:  ?   Pharynx: Oropharynx is clear.  ?Eyes:  ?   Conjunctiva/sclera: Conjunctivae normal.  ?Cardiovascular:  ?   Rate and Rhythm: Normal rate and regular rhythm.  ?   Pulses: Normal pulses.  ?   Heart sounds: Normal heart sounds.  ?Pulmonary:  ?   Effort: Pulmonary effort is normal.  ?   Breath sounds: Normal breath sounds.  ?Abdominal:  ?   General: Bowel sounds are normal.  ?Skin: ?   General: Skin is warm.  ?   Findings: Erythema and rash present.  ?Neurological:  ?    General: No focal deficit present.  ?   Mental Status: She is alert and oriented to person, place, and time.  ? ? ?No results found for any visits on 10/26/21. ? ? ?   ?Assessment & Plan:  ?Rash not well controlled  ?-take medication as prescribed ?-follow up with worsening unresolved symptoms ?-keep skin moisturized, avoid scratching with nails, cool baths and moisturize.  ? ?Follow up with worsening unresolved symptoms ? ? ?Problem List Items Addressed This Visit   ?None ?Visit Diagnoses   ? ? Allergic rash present on examination    -  Primary  ? Relevant Medications  ? predniSONE (DELTASONE) 5 MG tablet  ? hydrocortisone 1 % lotion  ? ?  ? ? ?Meds ordered this encounter  ?Medications  ? predniSONE (DELTASONE) 5 MG tablet  ?  Sig: Take 1 tablet (5 mg total) by mouth daily with breakfast.  ?  Dispense:  6 tablet  ?  Refill:  0  ?  Order Specific Question:   Supervising Provider  ?  AnswerClaretta Fraise [[id number]]  ? hydrocortisone 1 % lotion  ?  Sig: Apply 1 application. topically 2 (two) times daily.  ?  Dispense:  118 mL  ?  Refill:  0  ?  Order Specific Question:   Supervising Provider  ?  AnswerClaretta Fraise [[id number]]  ? ? ?Return if symptoms worsen or fail to improve. ? ?Ivy Lynn, NP ? ? ?

## 2021-10-26 NOTE — Patient Instructions (Signed)
Rash, Adult  A rash is a change in the color of your skin. A rash can also change the way your skin feels. There are many different conditions and factors that can cause a rash. Follow these instructions at home: The goal of treatment is to stop the itching and keep the rash from spreading. Watch for any changes in your symptoms. Let your doctor know about them. Follow these instructions to help with your condition: Medicine Take or apply over-the-counter and prescription medicines only as told by your doctor. These may include medicines: To treat red or swollen skin (corticosteroid creams). To treat itching. To treat an allergy (oral antihistamines). To treat very bad symptoms (oral corticosteroids).  Skin care Put cool cloths (compresses) on the affected areas. Do not scratch or rub your skin. Avoid covering the rash. Make sure that the rash is exposed to air as much as possible. Managing itching and discomfort Avoid hot showers or baths. These can make itching worse. A cold shower may help. Try taking a bath with: Epsom salts. You can get these at your local pharmacy or grocery store. Follow the instructions on the package. Baking soda. Pour a small amount into the bath as told by your doctor. Colloidal oatmeal. You can get this at your local pharmacy or grocery store. Follow the instructions on the package. Try putting baking soda paste onto your skin. Stir water into baking soda until it gets like a paste. Try putting on a lotion that relieves itchiness (calamine lotion). Keep cool and out of the sun. Sweating and being hot can make itching worse. General instructions  Rest as needed. Drink enough fluid to keep your pee (urine) pale yellow. Wear loose-fitting clothing. Avoid scented soaps, detergents, and perfumes. Use gentle soaps, detergents, perfumes, and other cosmetic products. Avoid anything that causes your rash. Keep a journal to help track what causes your rash. Write  down: What you eat. What cosmetic products you use. What you drink. What you wear. This includes jewelry. Keep all follow-up visits as told by your doctor. This is important. Contact a doctor if: You sweat at night. You lose weight. You pee (urinate) more than normal. You pee less than normal, or you notice that your pee is a darker color than normal. You feel weak. You throw up (vomit). Your skin or the whites of your eyes look yellow (jaundice). Your skin: Tingles. Is numb. Your rash: Does not go away after a few days. Gets worse. You are: More thirsty than normal. More tired than normal. You have: New symptoms. Pain in your belly (abdomen). A fever. Watery poop (diarrhea). Get help right away if: You have a fever and your symptoms suddenly get worse. You start to feel mixed up (confused). You have a very bad headache or a stiff neck. You have very bad joint pains or stiffness. You have jerky movements that you cannot control (seizure). Your rash covers all or most of your body. The rash may or may not be painful. You have blisters that: Are on top of the rash. Grow larger. Grow together. Are painful. Are inside your nose or mouth. You have a rash that: Looks like purple pinprick-sized spots all over your body. Has a "bull's eye" or looks like a target. Is red and painful, causes your skin to peel, and is not from being in the sun too long. Summary A rash is a change in the color of your skin. A rash can also change the way your skin   feels. The goal of treatment is to stop the itching and keep the rash from spreading. Take or apply over-the-counter and prescription medicines only as told by your doctor. Contact a doctor if you have new symptoms or symptoms that get worse. Keep all follow-up visits as told by your doctor. This is important. This information is not intended to replace advice given to you by your health care provider. Make sure you discuss any  questions you have with your health care provider. Document Revised: 03/10/2021 Document Reviewed: 03/10/2021 Elsevier Patient Education  2023 [employer]  

## 2021-10-27 DIAGNOSIS — Z1231 Encounter for screening mammogram for malignant neoplasm of breast: Secondary | ICD-10-CM | POA: Diagnosis not present

## 2021-11-02 ENCOUNTER — Other Ambulatory Visit: Payer: Self-pay | Admitting: Family Medicine

## 2021-11-14 DIAGNOSIS — I872 Venous insufficiency (chronic) (peripheral): Secondary | ICD-10-CM | POA: Diagnosis not present

## 2021-11-14 DIAGNOSIS — I83009 Varicose veins of unspecified lower extremity with ulcer of unspecified site: Secondary | ICD-10-CM | POA: Diagnosis not present

## 2021-11-21 DIAGNOSIS — R600 Localized edema: Secondary | ICD-10-CM | POA: Diagnosis not present

## 2021-11-21 DIAGNOSIS — I872 Venous insufficiency (chronic) (peripheral): Secondary | ICD-10-CM | POA: Diagnosis not present

## 2021-11-21 DIAGNOSIS — L97219 Non-pressure chronic ulcer of right calf with unspecified severity: Secondary | ICD-10-CM | POA: Diagnosis not present

## 2021-11-23 ENCOUNTER — Encounter (HOSPITAL_COMMUNITY): Payer: Self-pay | Admitting: Emergency Medicine

## 2021-11-23 ENCOUNTER — Emergency Department (HOSPITAL_COMMUNITY): Payer: Medicare Other

## 2021-11-23 ENCOUNTER — Inpatient Hospital Stay (HOSPITAL_COMMUNITY)
Admission: EM | Admit: 2021-11-23 | Discharge: 2021-11-25 | DRG: 291 | Disposition: A | Discharge status: Home / Self Care | Payer: Medicare Other | Attending: Internal Medicine | Admitting: Internal Medicine

## 2021-11-23 DIAGNOSIS — D539 Nutritional anemia, unspecified: Secondary | ICD-10-CM | POA: Diagnosis not present

## 2021-11-23 DIAGNOSIS — Z7901 Long term (current) use of anticoagulants: Secondary | ICD-10-CM

## 2021-11-23 DIAGNOSIS — M810 Age-related osteoporosis without current pathological fracture: Secondary | ICD-10-CM | POA: Diagnosis not present

## 2021-11-23 DIAGNOSIS — M1990 Unspecified osteoarthritis, unspecified site: Secondary | ICD-10-CM | POA: Diagnosis not present

## 2021-11-23 DIAGNOSIS — X58XXXA Exposure to other specified factors, initial encounter: Secondary | ICD-10-CM | POA: Diagnosis present

## 2021-11-23 DIAGNOSIS — I5033 Acute on chronic diastolic (congestive) heart failure: Secondary | ICD-10-CM | POA: Diagnosis present

## 2021-11-23 DIAGNOSIS — R531 Weakness: Secondary | ICD-10-CM | POA: Diagnosis not present

## 2021-11-23 DIAGNOSIS — I87391 Chronic venous hypertension (idiopathic) with other complications of right lower extremity: Secondary | ICD-10-CM | POA: Diagnosis not present

## 2021-11-23 DIAGNOSIS — Z20822 Contact with and (suspected) exposure to covid-19: Secondary | ICD-10-CM | POA: Diagnosis not present

## 2021-11-23 DIAGNOSIS — I872 Venous insufficiency (chronic) (peripheral): Secondary | ICD-10-CM | POA: Diagnosis not present

## 2021-11-23 DIAGNOSIS — L97219 Non-pressure chronic ulcer of right calf with unspecified severity: Secondary | ICD-10-CM | POA: Diagnosis not present

## 2021-11-23 DIAGNOSIS — E782 Mixed hyperlipidemia: Secondary | ICD-10-CM | POA: Diagnosis not present

## 2021-11-23 DIAGNOSIS — I499 Cardiac arrhythmia, unspecified: Secondary | ICD-10-CM | POA: Diagnosis not present

## 2021-11-23 DIAGNOSIS — K589 Irritable bowel syndrome without diarrhea: Secondary | ICD-10-CM | POA: Diagnosis present

## 2021-11-23 DIAGNOSIS — Z8701 Personal history of pneumonia (recurrent): Secondary | ICD-10-CM | POA: Diagnosis not present

## 2021-11-23 DIAGNOSIS — R7989 Other specified abnormal findings of blood chemistry: Secondary | ICD-10-CM | POA: Diagnosis present

## 2021-11-23 DIAGNOSIS — Z9049 Acquired absence of other specified parts of digestive tract: Secondary | ICD-10-CM

## 2021-11-23 DIAGNOSIS — Z886 Allergy status to analgesic agent status: Secondary | ICD-10-CM

## 2021-11-23 DIAGNOSIS — Z743 Need for continuous supervision: Secondary | ICD-10-CM | POA: Diagnosis not present

## 2021-11-23 DIAGNOSIS — R9431 Abnormal electrocardiogram [ECG] [EKG]: Secondary | ICD-10-CM | POA: Diagnosis not present

## 2021-11-23 DIAGNOSIS — I8390 Asymptomatic varicose veins of unspecified lower extremity: Secondary | ICD-10-CM | POA: Diagnosis present

## 2021-11-23 DIAGNOSIS — I509 Heart failure, unspecified: Principal | ICD-10-CM

## 2021-11-23 DIAGNOSIS — I442 Atrioventricular block, complete: Secondary | ICD-10-CM | POA: Diagnosis present

## 2021-11-23 DIAGNOSIS — R0902 Hypoxemia: Secondary | ICD-10-CM | POA: Diagnosis not present

## 2021-11-23 DIAGNOSIS — H409 Unspecified glaucoma: Secondary | ICD-10-CM | POA: Diagnosis not present

## 2021-11-23 DIAGNOSIS — I495 Sick sinus syndrome: Secondary | ICD-10-CM | POA: Diagnosis present

## 2021-11-23 DIAGNOSIS — R0789 Other chest pain: Secondary | ICD-10-CM | POA: Diagnosis not present

## 2021-11-23 DIAGNOSIS — Z86010 Personal history of colonic polyps: Secondary | ICD-10-CM

## 2021-11-23 DIAGNOSIS — I890 Lymphedema, not elsewhere classified: Secondary | ICD-10-CM | POA: Diagnosis not present

## 2021-11-23 DIAGNOSIS — S81801A Unspecified open wound, right lower leg, initial encounter: Secondary | ICD-10-CM | POA: Diagnosis present

## 2021-11-23 DIAGNOSIS — I10 Essential (primary) hypertension: Secondary | ICD-10-CM | POA: Diagnosis not present

## 2021-11-23 DIAGNOSIS — K219 Gastro-esophageal reflux disease without esophagitis: Secondary | ICD-10-CM | POA: Diagnosis not present

## 2021-11-23 DIAGNOSIS — Z853 Personal history of malignant neoplasm of breast: Secondary | ICD-10-CM

## 2021-11-23 DIAGNOSIS — Z91041 Radiographic dye allergy status: Secondary | ICD-10-CM

## 2021-11-23 DIAGNOSIS — Z950 Presence of cardiac pacemaker: Secondary | ICD-10-CM

## 2021-11-23 DIAGNOSIS — Z86718 Personal history of other venous thrombosis and embolism: Secondary | ICD-10-CM

## 2021-11-23 DIAGNOSIS — I4819 Other persistent atrial fibrillation: Secondary | ICD-10-CM | POA: Diagnosis present

## 2021-11-23 DIAGNOSIS — I110 Hypertensive heart disease with heart failure: Secondary | ICD-10-CM | POA: Diagnosis not present

## 2021-11-23 DIAGNOSIS — I480 Paroxysmal atrial fibrillation: Secondary | ICD-10-CM | POA: Diagnosis not present

## 2021-11-23 DIAGNOSIS — Z96653 Presence of artificial knee joint, bilateral: Secondary | ICD-10-CM | POA: Diagnosis present

## 2021-11-23 DIAGNOSIS — E538 Deficiency of other specified B group vitamins: Secondary | ICD-10-CM | POA: Diagnosis not present

## 2021-11-23 DIAGNOSIS — Z8249 Family history of ischemic heart disease and other diseases of the circulatory system: Secondary | ICD-10-CM

## 2021-11-23 DIAGNOSIS — Z87440 Personal history of urinary (tract) infections: Secondary | ICD-10-CM

## 2021-11-23 DIAGNOSIS — Z79899 Other long term (current) drug therapy: Secondary | ICD-10-CM

## 2021-11-23 DIAGNOSIS — R600 Localized edema: Secondary | ICD-10-CM | POA: Diagnosis not present

## 2021-11-23 DIAGNOSIS — R0600 Dyspnea, unspecified: Secondary | ICD-10-CM | POA: Diagnosis not present

## 2021-11-23 LAB — CBC
HCT: 34.7 % — ABNORMAL LOW (ref 36.0–46.0)
Hemoglobin: 11.2 g/dL — ABNORMAL LOW (ref 12.0–15.0)
MCH: 33.1 pg (ref 26.0–34.0)
MCHC: 32.3 g/dL (ref 30.0–36.0)
MCV: 102.7 fL — ABNORMAL HIGH (ref 80.0–100.0)
Platelets: 135 10*3/uL — ABNORMAL LOW (ref 150–400)
RBC: 3.38 MIL/uL — ABNORMAL LOW (ref 3.87–5.11)
RDW: 15.2 % (ref 11.5–15.5)
WBC: 4.3 10*3/uL (ref 4.0–10.5)
nRBC: 0 % (ref 0.0–0.2)

## 2021-11-23 LAB — HEPATIC FUNCTION PANEL
ALT: 21 U/L (ref 0–44)
AST: 23 U/L (ref 15–41)
Albumin: 3.9 g/dL (ref 3.5–5.0)
Alkaline Phosphatase: 68 U/L (ref 38–126)
Bilirubin, Direct: 0.2 mg/dL (ref 0.0–0.2)
Indirect Bilirubin: 0.6 mg/dL (ref 0.3–0.9)
Total Bilirubin: 0.8 mg/dL (ref 0.3–1.2)
Total Protein: 7.1 g/dL (ref 6.5–8.1)

## 2021-11-23 LAB — BASIC METABOLIC PANEL
Anion gap: 8 (ref 5–15)
BUN: 23 mg/dL (ref 8–23)
CO2: 27 mmol/L (ref 22–32)
Calcium: 9.2 mg/dL (ref 8.9–10.3)
Chloride: 98 mmol/L (ref 98–111)
Creatinine, Ser: 0.97 mg/dL (ref 0.44–1.00)
GFR, Estimated: 56 mL/min — ABNORMAL LOW (ref >60)
Glucose, Bld: 117 mg/dL — ABNORMAL HIGH (ref 70–99)
Potassium: 4.4 mmol/L (ref 3.5–5.1)
Sodium: 133 mmol/L — ABNORMAL LOW (ref 135–145)

## 2021-11-23 LAB — BRAIN NATRIURETIC PEPTIDE: B Natriuretic Peptide: 630 pg/mL — ABNORMAL HIGH (ref 0.0–100.0)

## 2021-11-23 LAB — CBG MONITORING, ED: Glucose-Capillary: 102 mg/dL — ABNORMAL HIGH (ref 70–99)

## 2021-11-23 LAB — TROPONIN I (HIGH SENSITIVITY)
Troponin I (High Sensitivity): 11 ng/L (ref <18)
Troponin I (High Sensitivity): 12 ng/L (ref <18)

## 2021-11-23 MED ORDER — FUROSEMIDE 10 MG/ML IJ SOLN
40.0000 mg | Freq: Once | INTRAMUSCULAR | Status: AC
  Administered 2021-11-23: 40 mg via INTRAVENOUS
  Filled 2021-11-23: qty 4

## 2021-11-23 MED ORDER — ACETAMINOPHEN 325 MG PO TABS
650.0000 mg | ORAL_TABLET | Freq: Once | ORAL | Status: AC
  Administered 2021-11-23: 650 mg via ORAL
  Filled 2021-11-23: qty 2

## 2021-11-23 NOTE — ED Triage Notes (Signed)
Pt c/o weakness that started today. Pt states weakness is better than it was this morning. Pt denies pain but states it is uncomfortable when she tries to take a deep breath.

## 2021-11-23 NOTE — ED Provider Notes (Signed)
[facility] EMERGENCY DEPARTMENT Provider Note   CSN: [id number] Arrival date & time: 11/23/21  1923     History  Chief Complaint  Patient presents with   Weakness    [name] is a 88 y.o. female.  HPI 88 year old female presents with generalized weakness.  History is from patient but also the daughter at the bedside.Patient has been feeling this way for 3-7 days.  Feels fatigued mostly.  A little bit of lightheadedness.  No syncope but she noted her blood pressure seem low for her at around 578 systolic this morning.  She had a little bit of chest tightness according to the family.  Some shortness of breath and a nonproductive cough.  No leg swelling and she has had a chronic wound treated at the vascular center.  No fevers.  She has been dealing with a nonproductive cough for about 2 weeks or so since getting back from the beach.  Back in January she was admitted for anemia and CHF and had her Lasix increased then.  Is remained stable ever since.  Home Medications Prior to Admission medications   Medication Sig Start Date End Date Taking? Authorizing Provider  acetaminophen (TYLENOL) 500 MG tablet Take 1,000 mg by mouth every 6 (six) hours as needed for mild pain.    [provider]  amiodarone (PACERONE) 200 MG tablet Take 1 tablet (200 mg total) by mouth daily. 09/14/21   Dettinger, Fransisca Kaufmann, MD  apixaban (ELIQUIS) 5 MG TABS tablet Take 1 tablet (5 mg total) by mouth 2 (two) times daily. 06/28/21 09/29/21  Baruch Gouty, FNP  azithromycin (ZITHROMAX) 250 MG tablet Take 250 mg by mouth as directed. 10/03/21   [provider]  calcium-vitamin D (OSCAL WITH D) 500-200 MG-UNIT tablet Take 1 tablet by mouth daily with breakfast.     [provider]  cephALEXin (KEFLEX) 500 MG capsule Take 500 mg by mouth 3 (three) times daily. 10/24/21   [provider]  cyanocobalamin (,VITAMIN B-12,) 1000 MCG/ML injection Inject 1 mL (1,000 mcg total) into the  muscle every 30 (thirty) days. 05/31/20   Dettinger, Fransisca Kaufmann, MD  furosemide (LASIX) 20 MG tablet TAKE ONE (1) TABLET BY MOUTH EVERY DAY 11/02/21   Dettinger, Fransisca Kaufmann, MD  hydrocortisone 1 % lotion Apply 1 application. topically 2 (two) times daily. 10/26/21   Ivy Lynn, NP  latanoprost (XALATAN) 0.005 % ophthalmic solution Place 1 drop into both eyes at bedtime.    [provider]  levocetirizine (XYZAL) 5 MG tablet Take 0.5 tablets (2.5 mg total) by mouth every evening. 10/05/21   Gwenlyn Perking, FNP  Lidocaine HCl 4 % CREA Apply 1 application. topically daily.    [provider]  Multiple Vitamin (MULTIVITAMIN) tablet Take 1 tablet by mouth every morning.    [provider]  nitroGLYCERIN (NITROSTAT) 0.4 MG SL tablet DISSOLVE 1 TAB UNDER TOUNGE FOR CHEST PAIN. MAY REPEAT EVERY 5 MINUTES FOR 3 DOSES. IF NO RELIEF CALL 911 OR GO TO ER Patient taking differently: Place 0.4 mg under the tongue every 5 (five) minutes as needed for chest pain. 09/10/20   Satira Sark, MD  pantoprazole (PROTONIX) 40 MG tablet Take 1 tablet (40 mg total) by mouth daily. 09/14/21   Dettinger, Fransisca Kaufmann, MD  predniSONE (DELTASONE) 5 MG tablet Take 1 tablet (5 mg total) by mouth daily with breakfast. 10/26/21   Ivy Lynn, NP  triamcinolone ointment (KENALOG) 0.5 % Apply 1  application. topically 2 (two) times daily. 10/05/21   Gwenlyn Perking, FNP  Wheat Dextrin (BENEFIBER) POWD Take 1 Dose by mouth daily as needed (constipation).    [provider]      Allergies    Iodinated contrast media and Naproxen    Review of Systems   Review of Systems  Constitutional:  Positive for fatigue. Negative for fever.  Respiratory:  Positive for cough, chest tightness and shortness of breath.   Cardiovascular:  Negative for chest pain and leg swelling.  Gastrointestinal:  Negative for abdominal pain.  Genitourinary:  Negative for dysuria.  Neurological:  Positive for weakness  (generalized). Negative for headaches.    Physical Exam Updated Vital Signs BP 130/77   Pulse 68   Temp 98.6 F (37 C) (Oral)   Resp 20   Ht '5\' 3"'$  (1.6 m)   Wt 70.4 kg   SpO2 91%   BMI 27.49 kg/m  Physical Exam Vitals and nursing note reviewed.  Constitutional:      Appearance: She is well-developed.  HENT:     Head: Normocephalic and atraumatic.  Cardiovascular:     Rate and Rhythm: Normal rate and regular rhythm.     Heart sounds: Normal heart sounds.  Pulmonary:     Effort: Pulmonary effort is normal.     Breath sounds: Rales (mild, bibasilar) present.  Abdominal:     Palpations: Abdomen is soft.     Tenderness: There is no abdominal tenderness.  Skin:    General: Skin is warm and dry.  Neurological:     Mental Status: She is alert.     ED Results / Procedures / Treatments   Labs (all labs ordered are listed, but only abnormal results are displayed) Labs Reviewed  BASIC METABOLIC PANEL - Abnormal; Notable for the following components:      Result Value   Sodium 133 (*)    Glucose, Bld 117 (*)    GFR, Estimated 56 (*)    All other components within normal limits  CBC - Abnormal; Notable for the following components:   RBC 3.38 (*)    Hemoglobin 11.2 (*)    HCT 34.7 (*)    MCV 102.7 (*)    Platelets 135 (*)    All other components within normal limits  BRAIN NATRIURETIC PEPTIDE - Abnormal; Notable for the following components:   B Natriuretic Peptide 630.0 (*)    All other components within normal limits  CBG MONITORING, ED - Abnormal; Notable for the following components:   Glucose-Capillary 102 (*)    All other components within normal limits  RESP PANEL BY RT-PCR (FLU A&B, COVID) [id number]  HEPATIC FUNCTION PANEL  URINALYSIS, ROUTINE W REFLEX MICROSCOPIC  TROPONIN I (HIGH SENSITIVITY)  TROPONIN I (HIGH SENSITIVITY)    EKG EKG Interpretation  Date/Time:  Wednesday November 23 2021 19:40:28 EDT Ventricular Rate:  61 PR Interval:    QRS  Duration: 200 QT Interval:  512 QTC Calculation: 516 R Axis:   -71 Text Interpretation: Junctional rhythm LVH with IVCD, LAD and secondary repol abnrm Prolonged QT interval Confirmed by Sherwood Gambler [phone]) on 11/23/2021 9:14:51 PM  Radiology DG Chest 2 View  Result Date: 11/23/2021 CLINICAL DATA:  Chest pressure, dyspnea, CHF EXAM: CHEST - 2 VIEW COMPARISON:  07/02/2021 FINDINGS: Lungs are clear. No pneumothorax or pleural effusion. Right subclavian dual lead pacemaker is unchanged. Mild cardiomegaly is stable. Pulmonary vascularity is normal. No acute bone abnormality. Remote healed left humeral head fracture deformity  again noted. IMPRESSION: No active cardiopulmonary disease. Electronically Signed   By: Fidela Salisbury M.D.   On: 11/23/2021 21:11    Procedures Procedures    Medications Ordered in ED Medications - No data to display  ED Course/ Medical Decision Making/ A&P                           Medical Decision Making Amount and/or Complexity of Data Reviewed Independent Historian:     Details: daughter External Data Reviewed: notes. Labs: ordered. Radiology: ordered and independent interpretation performed. ECG/medicine tests: ordered and independent interpretation performed.  Risk OTC drugs. Prescription drug management. Decision regarding hospitalization.   I suspect patient's symptoms are related to decompensated CHF.  She is not in distress but it seems like her symptoms are similar to when she had prior CHF according to the daughter.  She will be given a dose of IV Lasix.  Labs reviewed/interpreted and her BNP is 630 which is higher than she has had before.  Chest x-ray does not show obvious edema on my interpretation but her lung exam was concerning for some mild Rales.  ECG without acute change from baseline.  Troponin is normal.  No fevers or infectious symptoms.  I think diuresis and admission to ensure she improves and perhaps further diuresis is warranted.   Discussed with hospitalist, Dr. Josephine Cables.         Final Clinical Impression(s) / ED Diagnoses Final diagnoses:  None    Rx / DC Orders ED Discharge Orders     None         Sherwood Gambler, MD 11/23/21 2318

## 2021-11-24 ENCOUNTER — Observation Stay (HOSPITAL_COMMUNITY): Payer: Medicare Other

## 2021-11-24 DIAGNOSIS — S81801A Unspecified open wound, right lower leg, initial encounter: Secondary | ICD-10-CM

## 2021-11-24 DIAGNOSIS — I495 Sick sinus syndrome: Secondary | ICD-10-CM

## 2021-11-24 DIAGNOSIS — Z8249 Family history of ischemic heart disease and other diseases of the circulatory system: Secondary | ICD-10-CM | POA: Diagnosis not present

## 2021-11-24 DIAGNOSIS — D539 Nutritional anemia, unspecified: Secondary | ICD-10-CM

## 2021-11-24 DIAGNOSIS — M810 Age-related osteoporosis without current pathological fracture: Secondary | ICD-10-CM | POA: Diagnosis present

## 2021-11-24 DIAGNOSIS — R9431 Abnormal electrocardiogram [ECG] [EKG]: Secondary | ICD-10-CM

## 2021-11-24 DIAGNOSIS — E782 Mixed hyperlipidemia: Secondary | ICD-10-CM | POA: Diagnosis present

## 2021-11-24 DIAGNOSIS — I4819 Other persistent atrial fibrillation: Secondary | ICD-10-CM | POA: Diagnosis present

## 2021-11-24 DIAGNOSIS — Z853 Personal history of malignant neoplasm of breast: Secondary | ICD-10-CM | POA: Diagnosis not present

## 2021-11-24 DIAGNOSIS — R531 Weakness: Secondary | ICD-10-CM | POA: Diagnosis present

## 2021-11-24 DIAGNOSIS — K219 Gastro-esophageal reflux disease without esophagitis: Secondary | ICD-10-CM | POA: Diagnosis present

## 2021-11-24 DIAGNOSIS — I110 Hypertensive heart disease with heart failure: Secondary | ICD-10-CM | POA: Diagnosis present

## 2021-11-24 DIAGNOSIS — I5033 Acute on chronic diastolic (congestive) heart failure: Secondary | ICD-10-CM | POA: Diagnosis present

## 2021-11-24 DIAGNOSIS — Z8701 Personal history of pneumonia (recurrent): Secondary | ICD-10-CM | POA: Diagnosis not present

## 2021-11-24 DIAGNOSIS — E538 Deficiency of other specified B group vitamins: Secondary | ICD-10-CM

## 2021-11-24 DIAGNOSIS — Z950 Presence of cardiac pacemaker: Secondary | ICD-10-CM | POA: Diagnosis not present

## 2021-11-24 DIAGNOSIS — R7989 Other specified abnormal findings of blood chemistry: Secondary | ICD-10-CM

## 2021-11-24 DIAGNOSIS — M1990 Unspecified osteoarthritis, unspecified site: Secondary | ICD-10-CM | POA: Diagnosis present

## 2021-11-24 DIAGNOSIS — I872 Venous insufficiency (chronic) (peripheral): Secondary | ICD-10-CM | POA: Diagnosis present

## 2021-11-24 DIAGNOSIS — X58XXXA Exposure to other specified factors, initial encounter: Secondary | ICD-10-CM | POA: Diagnosis present

## 2021-11-24 DIAGNOSIS — I442 Atrioventricular block, complete: Secondary | ICD-10-CM | POA: Diagnosis present

## 2021-11-24 DIAGNOSIS — I10 Essential (primary) hypertension: Secondary | ICD-10-CM | POA: Diagnosis not present

## 2021-11-24 DIAGNOSIS — I480 Paroxysmal atrial fibrillation: Secondary | ICD-10-CM | POA: Diagnosis not present

## 2021-11-24 DIAGNOSIS — I8390 Asymptomatic varicose veins of unspecified lower extremity: Secondary | ICD-10-CM | POA: Diagnosis present

## 2021-11-24 DIAGNOSIS — Z86010 Personal history of colonic polyps: Secondary | ICD-10-CM | POA: Diagnosis not present

## 2021-11-24 DIAGNOSIS — Z7901 Long term (current) use of anticoagulants: Secondary | ICD-10-CM | POA: Diagnosis not present

## 2021-11-24 DIAGNOSIS — K589 Irritable bowel syndrome without diarrhea: Secondary | ICD-10-CM | POA: Diagnosis present

## 2021-11-24 DIAGNOSIS — Z86718 Personal history of other venous thrombosis and embolism: Secondary | ICD-10-CM | POA: Diagnosis not present

## 2021-11-24 DIAGNOSIS — Z20822 Contact with and (suspected) exposure to covid-19: Secondary | ICD-10-CM | POA: Diagnosis present

## 2021-11-24 DIAGNOSIS — H409 Unspecified glaucoma: Secondary | ICD-10-CM | POA: Diagnosis present

## 2021-11-24 LAB — URINALYSIS, ROUTINE W REFLEX MICROSCOPIC
Bilirubin Urine: NEGATIVE
Glucose, UA: NEGATIVE mg/dL
Hgb urine dipstick: NEGATIVE
Ketones, ur: NEGATIVE mg/dL
Leukocytes,Ua: NEGATIVE
Nitrite: NEGATIVE
Protein, ur: NEGATIVE mg/dL
Specific Gravity, Urine: 1.004 — ABNORMAL LOW (ref 1.005–1.030)
pH: 8 (ref 5.0–8.0)

## 2021-11-24 LAB — COMPREHENSIVE METABOLIC PANEL
ALT: 18 U/L (ref 0–44)
AST: 19 U/L (ref 15–41)
Albumin: 3.4 g/dL — ABNORMAL LOW (ref 3.5–5.0)
Alkaline Phosphatase: 59 U/L (ref 38–126)
Anion gap: 6 (ref 5–15)
BUN: 24 mg/dL — ABNORMAL HIGH (ref 8–23)
CO2: 30 mmol/L (ref 22–32)
Calcium: 9.2 mg/dL (ref 8.9–10.3)
Chloride: 103 mmol/L (ref 98–111)
Creatinine, Ser: 0.84 mg/dL (ref 0.44–1.00)
GFR, Estimated: >60 mL/min (ref >60)
Glucose, Bld: 95 mg/dL (ref 70–99)
Potassium: 3.9 mmol/L (ref 3.5–5.1)
Sodium: 139 mmol/L (ref 135–145)
Total Bilirubin: 1 mg/dL (ref 0.3–1.2)
Total Protein: 6.3 g/dL — ABNORMAL LOW (ref 6.5–8.1)

## 2021-11-24 LAB — ECHOCARDIOGRAM LIMITED
Calc EF: 63.6 %
Height: 63 in
S' Lateral: 3.4 cm
Single Plane A2C EF: 58.4 %
Single Plane A4C EF: 68.6 %
Weight: 2490.32 oz

## 2021-11-24 LAB — CBC
HCT: 31.4 % — ABNORMAL LOW (ref 36.0–46.0)
Hemoglobin: 10.3 g/dL — ABNORMAL LOW (ref 12.0–15.0)
MCH: 33.6 pg (ref 26.0–34.0)
MCHC: 32.8 g/dL (ref 30.0–36.0)
MCV: 102.3 fL — ABNORMAL HIGH (ref 80.0–100.0)
Platelets: 131 10*3/uL — ABNORMAL LOW (ref 150–400)
RBC: 3.07 MIL/uL — ABNORMAL LOW (ref 3.87–5.11)
RDW: 15.2 % (ref 11.5–15.5)
WBC: 3.8 10*3/uL — ABNORMAL LOW (ref 4.0–10.5)
nRBC: 0 % (ref 0.0–0.2)

## 2021-11-24 LAB — RESP PANEL BY RT-PCR (FLU A&B, COVID) ARPGX2
Influenza A by PCR: NEGATIVE
Influenza B by PCR: NEGATIVE
SARS Coronavirus 2 by RT PCR: NEGATIVE

## 2021-11-24 LAB — FOLATE: Folate: 31 ng/mL (ref >5.9)

## 2021-11-24 LAB — MAGNESIUM: Magnesium: 2.1 mg/dL (ref 1.7–2.4)

## 2021-11-24 LAB — VITAMIN B12: Vitamin B-12: 867 pg/mL (ref 180–914)

## 2021-11-24 LAB — PHOSPHORUS: Phosphorus: 3.4 mg/dL (ref 2.5–4.6)

## 2021-11-24 MED ORDER — ACETAMINOPHEN 325 MG PO TABS
650.0000 mg | ORAL_TABLET | Freq: Four times a day (QID) | ORAL | Status: DC | PRN
  Administered 2021-11-24: 650 mg via ORAL
  Filled 2021-11-24: qty 2

## 2021-11-24 MED ORDER — APIXABAN 5 MG PO TABS
5.0000 mg | ORAL_TABLET | Freq: Two times a day (BID) | ORAL | Status: DC
  Administered 2021-11-24 – 2021-11-25 (×3): 5 mg via ORAL
  Filled 2021-11-24 (×3): qty 1

## 2021-11-24 MED ORDER — FUROSEMIDE 10 MG/ML IJ SOLN
40.0000 mg | Freq: Two times a day (BID) | INTRAMUSCULAR | Status: DC
  Administered 2021-11-24 (×2): 40 mg via INTRAVENOUS
  Filled 2021-11-24 (×3): qty 4

## 2021-11-24 MED ORDER — CALCIUM CARBONATE-VITAMIN D 500-200 MG-UNIT PO TABS
1.0000 | ORAL_TABLET | Freq: Every day | ORAL | Status: DC
Start: 2021-11-24 — End: 2021-11-24

## 2021-11-24 MED ORDER — PANTOPRAZOLE SODIUM 40 MG PO TBEC
40.0000 mg | DELAYED_RELEASE_TABLET | Freq: Every day | ORAL | Status: DC
  Administered 2021-11-24 – 2021-11-25 (×2): 40 mg via ORAL
  Filled 2021-11-24 (×2): qty 1

## 2021-11-24 MED ORDER — AMIODARONE HCL 200 MG PO TABS
200.0000 mg | ORAL_TABLET | Freq: Every day | ORAL | Status: DC
  Administered 2021-11-24 – 2021-11-25 (×2): 200 mg via ORAL
  Filled 2021-11-24 (×2): qty 1

## 2021-11-24 MED ORDER — PROCHLORPERAZINE EDISYLATE 10 MG/2ML IJ SOLN: 5.0000 mg | Freq: Four times a day (QID) | INTRAMUSCULAR | Status: DC | PRN

## 2021-11-24 MED ORDER — OYSTER SHELL CALCIUM/D3 500-5 MG-MCG PO TABS
1.0000 | ORAL_TABLET | Freq: Every day | ORAL | Status: DC
  Administered 2021-11-24 – 2021-11-25 (×2): 1 via ORAL
  Filled 2021-11-24 (×2): qty 1

## 2021-11-24 MED ORDER — ADULT MULTIVITAMIN W/MINERALS CH
1.0000 | ORAL_TABLET | Freq: Every morning | ORAL | Status: DC
  Administered 2021-11-24 – 2021-11-25 (×2): 1 via ORAL
  Filled 2021-11-24 (×2): qty 1

## 2021-11-24 NOTE — Assessment & Plan Note (Signed)
Continue monthly supplementation in the outpatient setting

## 2021-11-24 NOTE — Progress Notes (Signed)
Patient arrived to floor with skin intact. Patient had ace wrap to right lower leg, which had been done by wound clinic.  Patient stated leg had just been wrapped, and refused to let staff assess the wound/ulcer under dressing at this time.

## 2021-11-24 NOTE — Progress Notes (Signed)
*  PRELIMINARY RESULTS* Echocardiogram Limited 2D Echocardiogram has been performed.  [name] 11/24/2021, 9:26 AM

## 2021-11-24 NOTE — TOC Initial Note (Signed)
Transition of Care [facility]) - Initial/Assessment Note    Patient Details  Name: [name] MRN: [id number] Date of Birth: [birth year]  [facility]) CM/SW Contact:    Ihor Gully, LCSW Phone Number: 11/24/2021, 2:58 PM  Clinical Narrative:                 Patient from home alone. Son checks on her daily. Patient still drives some. She ambulates with a four wheeled rolling walker. Her family typically takes her to appointments.  Patient follows a heart healthy diet. She takes daily weights and daily vitamins.    Expected Discharge Plan: Home/Self Care Barriers to Discharge: Continued Medical Work up   Patient Goals and CMS Choice        Expected Discharge Plan and Services Expected Discharge Plan: Home/Self Care       Living arrangements for the past 2 months: Single Family Home                                      Prior Living Arrangements/Services Living arrangements for the past 2 months: Single Family Home Lives with:: Self Patient language and need for interpreter reviewed:: Yes        Need for Family Participation in Patient Care: Yes (Comment) Care giver support system in place?: Yes (comment) Current home services: DME (four wheeled walker) Criminal Activity/Legal Involvement Pertinent to Current Situation/Hospitalization: No - Comment as needed  Activities of Daily Living Home Assistive Devices/Equipment: Cane (specify quad or straight), Walker (specify type) ADL Screening (condition at time of admission) Patient's cognitive ability adequate to safely complete daily activities?: Yes Is the patient deaf or have difficulty hearing?: No Does the patient have difficulty seeing, even when wearing glasses/contacts?: No Does the patient have difficulty concentrating, remembering, or making decisions?: No Patient able to express need for assistance with ADLs?: Yes Does the patient have difficulty dressing or bathing?: No Independently  performs ADLs?: Yes (appropriate for developmental age) Does the patient have difficulty walking or climbing stairs?: No Weakness of Legs: None Weakness of Arms/Hands: None  Permission Sought/Granted Permission sought to share information with : Family Supports    Share Information with NAME: son, Louie Casa           Emotional Assessment     Affect (typically observed): Appropriate Orientation: : Oriented to Self, Oriented to Place, Oriented to  Time, Oriented to Situation Alcohol / Substance Use: Not Applicable Psych Involvement: No (comment)  Admission diagnosis:  Acute on chronic diastolic CHF (congestive heart failure) (HCC) [I50.33] Acute on chronic congestive heart failure, unspecified heart failure type ([place]) [I50.9] Patient Active Problem List   Diagnosis Date Noted   Acute on chronic diastolic CHF (congestive heart failure) ([place]) 11/24/2021   Elevated brain natriuretic peptide (BNP) level 11/24/2021   Prolonged QT interval 11/24/2021   Open wound of right lower extremity without complication 66/44/0347   Gastric nodule    Chronic CHF (congestive heart failure) ([place]) 07/02/2021   Iron deficiency anemia 07/02/2021   History of DVT (deep vein thrombosis) 07/02/2021   Pulmonary HTN /echo 11/18/2020 11/18/2020   Glaucoma    Macrocytic anemia    Osteoporosis 11/25/2018   B12 deficiency 11/25/2018   Coronary artery disease due to lipid rich plaque 01/27/2016   GERD (gastroesophageal reflux disease) 09/01/2015   Chronic anticoagulation 09/01/2015   History of total knee arthroplasty 02/10/2015   Sick sinus  syndrome ([place]) 03/22/2011   Hyperlipidemia 02/02/2009   Essential hypertension, benign 02/02/2009   Paroxysmal atrial fibrillation ([place]) 05/15/2008   COLONIC POLYPS, ADENOMATOUS, HX OF 05/15/2008   PCP:  Dettinger, Fransisca Kaufmann, MD Pharmacy:   [facility] [address] [place] [address] Phone: [phone] Fax:  [facility] [place] [address] Phone: [phone] Fax: [phone]     Social Determinants of Health (SDOH) Interventions    Readmission Risk Interventions     No data to display

## 2021-11-24 NOTE — Care Management Obs Status (Signed)
[facility] NOTIFICATION   Patient Details  Name: [name] MRN: [id number] Date of Birth: Feb 27, 1929   Medicare Observation Status Notification Given:  Yes    Tommy Medal 11/24/2021, 12:12 PM

## 2021-11-24 NOTE — Progress Notes (Addendum)
PROGRESS NOTE  [name] XFG:[id number] DOB: Apr 01, 1929 DOA: 11/23/2021 PCP: Dettinger, Fransisca Kaufmann, MD  Brief History:  88 year old female with history of HFpEF, sick sinus syndrome and complete heart block status post PPM, recurrent DVT on apixaban, persistent atrial fibrillation, hyperlipidemia, hypertension presenting with 1 week history of generalized weakness and dyspnea on exertion.  The patient states that she has noticed some mild increase in her lower extremity edema.  Notably, the patient went on vacation to the beach about 1 week prior to this admission when she endorsed some dietary indiscretion and drinking more fluid than usual.  Otherwise, the patient is vigilant about her exact fluid intake and dietary restrictions.  She states that she has gained about at least 3 pounds since she has been home from vacation.  She denies any fevers, chills, chest pain, nausea, vomiting or diarrhea, abdominal pain. In the ED, the patient was afebrile and hemodynamically stable with oxygen saturation 90% room air.  BMP showed a sodium 133, potassium 4.4, serum creatinine 0.97.  Chest x-ray did not show any frank pulmonary edema.  Troponin 11>> 12.  LFTs were unremarkable.  UA was negative for pyuria.  BNP was 630.  The patient was started on IV furosemide.     Assessment and Plan: * Acute on chronic diastolic CHF (congestive heart failure) (HCC) Patient remains clinically fluid overloaded She has JVD and basilar crackles Continue IV furosemide Daily weights Echo does not has Obtain ReDS reading 07/02/2021 echo-EF 60 to 65%, no WMA, PASP 53.7, moderate TR, mild to moderate AS  Open wound of right lower extremity without complication UNNA boot placed by her vascular surgeon on 11/23/2021 Do not plan to remove at this time It is planned to be changed on 12/01/2021  Prolonged QT interval Optimize electrolytes  B12 deficiency Continue monthly supplementation in the outpatient  setting  Persistent atrial fibrillation ([place]) Pt follows Dr. Rayann Heman -Rate controlled -Continue amiodarone -Continue apixaban  Sick sinus syndrome ([place]) Status post PPM Patient had recent generator change March 2023 Follow-up Dr. Rayann Heman  Essential hypertension, benign BP stable No longer on any hypertensive medications at home        Family Communication:   son updated at bedside 6/15  Consultants:  none  Code Status:  FULL   DVT Prophylaxis:  apixaban   Procedures: As Listed in Progress Note Above  Antibiotics: None     Subjective: Patient denies fevers, chills, headache, chest pain, dyspnea, nausea, vomiting, diarrhea, abdominal pain, dysuria, hematuria, hematochezia, and melena.   Objective: Vitals:   11/24/21 0126 11/24/21 0127 11/24/21 0445 11/24/21 1229  BP:  (!) 134/58 113/67 103/60  Pulse:  (!) 58 61 (!) 59  Resp:  '16 18 16  '$ Temp:   97.7 F (36.5 C) 98.2 F (36.8 C)  TempSrc:      SpO2:  99% 99% 95%  Weight: 70.6 kg     Height: '5\' 3"'$  (1.6 m)       Intake/Output Summary (Last 24 hours) at 11/24/2021 1603 Last data filed at 11/24/2021 1300 Gross per 24 hour  Intake 600 ml  Output 1700 ml  Net -1100 ml   Weight change:  Exam:  General:  Pt is alert, follows commands appropriately, not in acute distress HEENT: No icterus, No thrush, No neck mass, [place]/AT Cardiovascular: RRR, S1/S2, no rubs, no gallops +JVD Respiratory: bibasilar crackles. No wheeze Abdomen: Soft/+BS, non tender, non distended, no guarding Extremities: trace LE  edema, No lymphangitis, No petechiae, No rashes, no synovitis   Data Reviewed: I have personally reviewed following labs and imaging studies Basic Metabolic Panel: Recent Labs  Lab 11/23/21 1950 11/24/21 0453  NA 133* 139  K 4.4 3.9  CL 98 103  CO2 27 30  GLUCOSE 117* 95  BUN 23 24*  CREATININE 0.97 0.84  CALCIUM 9.2 9.2  MG  --  2.1  PHOS  --  3.4   Liver Function Tests: Recent Labs  Lab  11/23/21 1950 11/24/21 0453  AST 23 19  ALT 21 18  ALKPHOS 68 59  BILITOT 0.8 1.0  PROT 7.1 6.3*  ALBUMIN 3.9 3.4*   No results for input(s): "LIPASE", "AMYLASE" in the last 168 hours. No results for input(s): "AMMONIA" in the last 168 hours. Coagulation Profile: No results for input(s): "INR", "PROTIME" in the last 168 hours. CBC: Recent Labs  Lab 11/23/21 1950 11/24/21 0453  WBC 4.3 3.8*  HGB 11.2* 10.3*  HCT 34.7* 31.4*  MCV 102.7* 102.3*  PLT 135* 131*   Cardiac Enzymes: No results for input(s): "CKTOTAL", "CKMB", "CKMBINDEX", "TROPONINI" in the last 168 hours. BNP: Invalid input(s): "POCBNP" CBG: Recent Labs  Lab 11/23/21 1946  GLUCAP 102*   HbA1C: No results for input(s): "HGBA1C" in the last 72 hours. Urine analysis:    Component Value Date/Time   COLORURINE COLORLESS (A) 11/24/2021 0101   APPEARANCEUR CLEAR 11/24/2021 0101   LABSPEC 1.004 (L) 11/24/2021 0101   PHURINE 8.0 11/24/2021 0101   GLUCOSEU NEGATIVE 11/24/2021 0101   HGBUR NEGATIVE 11/24/2021 0101   BILIRUBINUR NEGATIVE 11/24/2021 0101   KETONESUR NEGATIVE 11/24/2021 0101   PROTEINUR NEGATIVE 11/24/2021 0101   UROBILINOGEN 0.2 02/03/2015 1518   NITRITE NEGATIVE 11/24/2021 0101   LEUKOCYTESUR NEGATIVE 11/24/2021 0101   Sepsis Labs: '@LABRCNTIP'$ (procalcitonin:4,lacticidven:4) ) Recent Results (from the past 240 hour(s))  Resp Panel by RT-PCR (Flu A&B, Covid) Anterior Nasal Swab     Status: None   Collection Time: 11/23/21  8:58 PM   Specimen: Anterior Nasal Swab  Result Value Ref Range Status   SARS Coronavirus 2 by RT PCR NEGATIVE NEGATIVE Final    Comment: (NOTE) SARS-CoV-2 target nucleic acids are NOT DETECTED.  The SARS-CoV-2 RNA is generally detectable in upper respiratory specimens during the acute phase of infection. The lowest concentration of SARS-CoV-2 viral copies this assay can detect is 138 copies/mL. A negative result does not preclude SARS-Cov-2 infection and should not  be used as the sole basis for treatment or other patient management decisions. A negative result may occur with  improper specimen collection/handling, submission of specimen other than nasopharyngeal swab, presence of viral mutation(s) within the areas targeted by this assay, and inadequate number of viral copies(<138 copies/mL). A negative result must be combined with clinical observations, patient history, and epidemiological information. The expected result is Negative.  Fact Sheet for Patients:  EntrepreneurPulse.com.au  Fact Sheet for Healthcare Providers:  IncredibleEmployment.be  This test is no t yet approved or cleared by the [place] FDA and  has been authorized for detection and/or diagnosis of SARS-CoV-2 by FDA under an Emergency Use Authorization (EUA). This EUA will remain  in effect (meaning this test can be used) for the duration of the COVID-19 declaration under Section 564(b)(1) of the Act, 21 U.S.C.section 360bbb-3(b)(1), unless the authorization is terminated  or revoked sooner.       Influenza A by PCR NEGATIVE NEGATIVE Final   Influenza B by PCR NEGATIVE NEGATIVE Final  Comment: (NOTE) The Xpert Xpress SARS-CoV-2/FLU/RSV plus assay is intended as an aid in the diagnosis of influenza from Nasopharyngeal swab specimens and should not be used as a sole basis for treatment. Nasal washings and aspirates are unacceptable for Xpert Xpress SARS-CoV-2/FLU/RSV testing.  Fact Sheet for Patients: EntrepreneurPulse.com.au  Fact Sheet for Healthcare Providers: IncredibleEmployment.be  This test is not yet approved or cleared by the [place] FDA and has been authorized for detection and/or diagnosis of SARS-CoV-2 by FDA under an Emergency Use Authorization (EUA). This EUA will remain in effect (meaning this test can be used) for the duration of the COVID-19 declaration under Section  564(b)(1) of the Act, 21 U.S.C. section 360bbb-3(b)(1), unless the authorization is terminated or revoked.  Performed at [facility], [address] [place], [place] [address]      Scheduled Meds:  amiodarone  200 mg Oral Daily   apixaban  5 mg Oral BID   calcium-vitamin D  1 tablet Oral Q breakfast   furosemide  40 mg Intravenous Q12H   multivitamin with minerals  1 tablet Oral q morning   pantoprazole  40 mg Oral Daily   Continuous Infusions:  Procedures/Studies: DG Chest 2 View  Result Date: 11/23/2021 CLINICAL DATA:  Chest pressure, dyspnea, CHF EXAM: CHEST - 2 VIEW COMPARISON:  07/02/2021 FINDINGS: Lungs are clear. No pneumothorax or pleural effusion. Right subclavian dual lead pacemaker is unchanged. Mild cardiomegaly is stable. Pulmonary vascularity is normal. No acute bone abnormality. Remote healed left humeral head fracture deformity again noted. IMPRESSION: No active cardiopulmonary disease. Electronically Signed   By: Fidela Salisbury M.D.   On: 11/23/2021 21:11   HM MAMMOGRAPHY  Negative // Virl Axe Drucella Karbowski, DO  [facility]  If 7PM-7AM, please contact night-coverage [email or web address] Password [email or web address] 11/24/2021, 4:03 PM   LOS: 0 days

## 2021-11-24 NOTE — Assessment & Plan Note (Addendum)
UNNA boot placed by her vascular surgeon on 11/23/2021 Do not plan to remove at this time It is planned to be changed on 12/01/2021

## 2021-11-24 NOTE — H&P (Signed)
History and Physical    Patient: [name] QQV:[id number] DOB: 1929-04-18 DOA: 11/23/2021 DOS: the patient was seen and examined on 11/24/2021 PCP: Dettinger, Fransisca Kaufmann, MD  Patient coming from: Home  Chief Complaint:  Chief Complaint  Patient presents with   Weakness   HPI: [name] is a 88 y.o. female with medical history significant of   HFpEF, PAF, sick sinus syndrome s/p PPM implantation, VTE on Eliquis who presents to the emergency department due to 3 to 7-day onset of weakness, intermittent chest tightness and lightheadedness.  She complained of some shortness of breath and nonproductive cough (about 2 weeks) and she endorsed chronic wound that was being treated at the vascular center. Patient was admitted to [facility] from January 21 to July 05, 2021 due to acute on chronic diastolic CHF in which she was treated with Lasix.  ED Course:  In the emergency department, temperature was 98.6, respiratory rate 20, pulse 68, BP 130/77, O2 sat 91% on room air.  Work-up in the ED showed macrocytic anemia, BMP was normal except for Na 133, BNP 630, urinalysis was normal, troponin x2 was negative, liver enzymes were normal. Chest x-ray showed no active cardiopulmonary disease Patient was treated with Tylenol and IV Lasix 40 mg x 1 was given with 650 mL of urine noted in the canister at bedside.  Review of Systems: Review of systems as noted in the HPI. All other systems reviewed and are negative.   Past Medical History:  Diagnosis Date   Arthritis    Atrial flutter ([place])    s/p RFA 2003   Breast cancer ([place])    1980's - lumpectomy only   Colon polyps    Diverticulosis    DVT, lower extremity ([place]) 1998 and 1999   Bilateral   Essential hypertension    Gallstones    GERD (gastroesophageal reflux disease)    Glaucoma    IBS (irritable bowel syndrome)    Mixed hyperlipidemia    Osteoporosis    Persistent atrial fibrillation ([place])    Pneumonia 2011   Sick  sinus syndrome (HCC)    PPM ([facility])   Urinary tract infection    Varicose veins    Past Surgical History:  Procedure Laterality Date   BIOPSY  07/04/2021   Procedure: BIOPSY;  Surgeon: Thornton Park, MD;  Location: [place];  Service: Gastroenterology;;   BREAST LUMPECTOMY  [phone]   Right breast    CARDIAC CATHETERIZATION  1999   Normal coronaries   Cataracts Bilateral    CHOLECYSTECTOMY     COLONOSCOPY W/ BIOPSIES AND POLYPECTOMY  03/2003, 05/2008, 02/20/2011   severe diverticulosis, tubulovillous adenoma polyp, internal and external hemorrhoids 2012: severe diverticulosis, 4 mm polyp, hemorrhoids   CYST REMOVAL TRUNK     under breast = on belly    DILATION AND CURETTAGE OF UTERUS     ESOPHAGOGASTRODUODENOSCOPY (EGD) WITH PROPOFOL N/A 07/04/2021   Procedure: ESOPHAGOGASTRODUODENOSCOPY (EGD) WITH PROPOFOL;  Surgeon: Thornton Park, MD;  Location: [place];  Service: Gastroenterology;  Laterality: N/A;   EYE SURGERY     cataracts - bilateral    Fractured wrist Left 2013   repair   INSERT / REPLACE / REMOVE PACEMAKER     KNEE ARTHROSCOPY WITH MEDIAL MENISECTOMY Right 10/21/2014   Procedure: RIGHT KNEE ARTHROSCOPY WITH MEDIAL MENISECTOM,lateral menisectomy,synovectomy suprapatellar pouch;  Surgeon: Latanya Maudlin, MD;  Location: [facility];  Service: Orthopedics;  Laterality: Right;   PACEMAKER GENERATOR CHANGE  06/18/12   [facility]  DR RF, Dr Rayann Heman   PACEMAKER INSERTION  2003   PERMANENT PACEMAKER GENERATOR CHANGE N/A 06/18/2012   Procedure: PERMANENT PACEMAKER GENERATOR CHANGE;  Surgeon: Thompson Grayer, MD;  Location: [facility];  Service: Cardiovascular;  Laterality: N/A;   PPM GENERATOR CHANGEOUT N/A 09/30/2021   Procedure: PPM GENERATOR CHANGEOUT;  Surgeon: Thompson Grayer, MD;  Location: [place] [facility];  Service: Cardiovascular;  Laterality: N/A;   TOTAL KNEE ARTHROPLASTY  12/20/2011   Procedure: TOTAL KNEE ARTHROPLASTY;  Surgeon: Tobi Bastos, MD;  Location: [facility];   Service: Orthopedics;  Laterality: Left;   TOTAL KNEE ARTHROPLASTY Right 02/10/2015   Procedure: TOTAL RIGHT KNEE ARTHROPLASTY;  Surgeon: Latanya Maudlin, MD;  Location: [facility];  Service: Orthopedics;  Laterality: Right;    Social History:  reports that she has never smoked. She has never used smokeless tobacco. She reports that she does not drink alcohol and does not use drugs.   Allergies  Allergen Reactions   Iodinated Contrast Media Hives, Itching and Rash    03/13/14: symptoms began within two hours of intrathecal injection (myelogram 03/11/14); on shoulders radiating down to stomach.  Relief with Benadryl.  Told patient she would need to pre-med with Benadryl in the future before receiving this contrast.  Brita Romp, RN   Naproxen Hives, Itching and Rash    Family History  Problem Relation Age of Onset   Throat cancer Father    Stroke Mother    Alzheimer's disease Mother    Alzheimer's disease Brother    Alzheimer's disease Sister    Colon cancer Maternal Grandfather    Stomach cancer Maternal Grandmother    Heart disease Paternal Grandmother    Heart attack Paternal Grandmother    Prostate cancer Son    Colon cancer Sister    Colon cancer Brother    Heart disease Brother    Atrial fibrillation Brother    Diabetes Brother    Dementia Brother    Parkinson's disease Brother    Prostate cancer Son      Prior to Admission medications   Medication Sig Start Date End Date Taking? Authorizing Provider  acetaminophen (TYLENOL) 500 MG tablet Take 1,000 mg by mouth every 6 (six) hours as needed for mild pain.   Yes [provider]  amiodarone (PACERONE) 200 MG tablet Take 1 tablet (200 mg total) by mouth daily. 09/14/21  Yes Dettinger, Fransisca Kaufmann, MD  apixaban (ELIQUIS) 5 MG TABS tablet Take 1 tablet (5 mg total) by mouth 2 (two) times daily. 06/28/21 11/23/21 Yes Rakes, Connye Burkitt, FNP  calcium-vitamin D (OSCAL WITH D) 500-200 MG-UNIT tablet Take 1 tablet by mouth daily with  breakfast.    Yes [provider]  cyanocobalamin (,VITAMIN B-12,) 1000 MCG/ML injection Inject 1 mL (1,000 mcg total) into the muscle every 30 (thirty) days. 05/31/20  Yes Dettinger, Fransisca Kaufmann, MD  furosemide (LASIX) 20 MG tablet TAKE ONE (1) TABLET BY MOUTH EVERY DAY Patient taking differently: Take 40 mg by mouth daily. 11/02/21  Yes Dettinger, Fransisca Kaufmann, MD  hydrocortisone 1 % lotion Apply 1 application. topically 2 (two) times daily. 10/26/21  Yes Ivy Lynn, NP  latanoprost (XALATAN) 0.005 % ophthalmic solution Place 1 drop into both eyes at bedtime.   Yes [provider]  Lidocaine HCl 4 % CREA Apply 1 application. topically daily.   Yes [provider]  Multiple Vitamin (MULTIVITAMIN) tablet Take 1 tablet by mouth every morning.   Yes [provider]  nitroGLYCERIN (NITROSTAT) 0.4 MG SL tablet DISSOLVE 1 TAB UNDER TOUNGE FOR CHEST PAIN. MAY REPEAT EVERY 5 MINUTES FOR 3 DOSES. IF NO RELIEF CALL 911 OR GO TO ER Patient taking differently: Place 0.4 mg under the tongue every 5 (five) minutes as needed for chest pain. 09/10/20  Yes Satira Sark, MD  pantoprazole (PROTONIX) 40 MG tablet Take 1 tablet (40 mg total) by mouth daily. 09/14/21  Yes Dettinger, Fransisca Kaufmann, MD  Wheat Dextrin (BENEFIBER) POWD Take 1 Dose by mouth daily as needed (constipation).   Yes [provider]    Physical Exam: BP (!) 134/58   Pulse (!) 58   Temp 98.6 F (37 C) (Oral)   Resp 16   Ht '5\' 3"'$  (1.6 m)   Wt 70.6 kg   SpO2 99%   BMI 27.57 kg/m   General: 88 y.o. year-old female well developed well nourished in no acute distress.  Alert and oriented x3. HEENT: NCAT, EOMI Neck: Supple, trachea medial Cardiovascular: Regular rate and rhythm with no rubs or gallops.  No thyromegaly or JVD noted.  No lower extremity edema. 2/4 pulses in all 4 extremities. Respiratory: Bilateral rales in lower lobes on auscultation.  No wheezes  Abdomen: Soft, nontender nondistended  with normal bowel sounds x4 quadrants. Muskuloskeletal: No cyanosis, clubbing or edema noted bilaterally Neuro: CN II-XII intact, strength 5/5 x 4, sensation, reflexes intact Skin: No ulcerative lesions noted or rashes Psychiatry: Judgement and insight appear normal. Mood is appropriate for condition and setting          Labs on Admission:  Basic Metabolic Panel: Recent Labs  Lab 11/23/21 1950  NA 133*  K 4.4  CL 98  CO2 27  GLUCOSE 117*  BUN 23  CREATININE 0.97  CALCIUM 9.2   Liver Function Tests: Recent Labs  Lab 11/23/21 1950  AST 23  ALT 21  ALKPHOS 68  BILITOT 0.8  PROT 7.1  ALBUMIN 3.9   No results for input(s): "LIPASE", "AMYLASE" in the last 168 hours. No results for input(s): "AMMONIA" in the last 168 hours. CBC: Recent Labs  Lab 11/23/21 1950  WBC 4.3  HGB 11.2*  HCT 34.7*  MCV 102.7*  PLT 135*   Cardiac Enzymes: No results for input(s): "CKTOTAL", "CKMB", "CKMBINDEX", "TROPONINI" in the last 168 hours.  BNP (last 3 results) Recent Labs    03/08/21 1601 07/02/21 1805 11/23/21 1950  BNP 376.8* 450.3* 630.0*    ProBNP (last 3 results) No results for input(s): "PROBNP" in the last 8760 hours.  CBG: Recent Labs  Lab 11/23/21 1946  GLUCAP 102*    Radiological Exams on Admission: DG Chest 2 View  Result Date: 11/23/2021 CLINICAL DATA:  Chest pressure, dyspnea, CHF EXAM: CHEST - 2 VIEW COMPARISON:  07/02/2021 FINDINGS: Lungs are clear. No pneumothorax or pleural effusion. Right subclavian dual lead pacemaker is unchanged. Mild cardiomegaly is stable. Pulmonary vascularity is normal. No acute bone abnormality. Remote healed left humeral head fracture deformity again noted. IMPRESSION: No active cardiopulmonary disease. Electronically Signed   By: Fidela Salisbury M.D.   On: 11/23/2021 21:11    EKG: I independently viewed the EKG done and my findings are as followed: Junctional rhythm at rate of 61 bpm, QTc 516 ms  Assessment/Plan Present on  Admission:  Acute on chronic diastolic CHF (congestive heart failure) (HCC)  Sick sinus syndrome (HCC)  Paroxysmal atrial fibrillation (HCC)  GERD (gastroesophageal reflux disease)  Essential hypertension, benign  B12 deficiency  Principal Problem:  Acute on chronic diastolic CHF (congestive heart failure) (HCC) Active Problems:   Essential hypertension, benign   Paroxysmal atrial fibrillation (HCC)   Sick sinus syndrome (HCC)   GERD (gastroesophageal reflux disease)   B12 deficiency   Macrocytic anemia   Elevated brain natriuretic peptide (BNP) level   Prolonged QT interval   Open wound of right lower extremity without complication  Acute on chronic diastolic CHF Elevated BNP- 630 Continue total input/output, daily weights and fluid restriction Continue IV Lasix 40 twice daily Continue Cardiac diet  Echocardiogram done in July 03, 2021 showed LVEF of 60 to 65%.  RV size is mildly enlarged.  Moderately elevated PASP.  RVSP 53.7.  LA size moderately dilated.  RV size moderately dilated.  MV is degenerative.  Mild MV regurgitation.  TV regurgitation is moderate.  Echocardiogram will be done in the morning  Cardiology will be consulted and we shall await further recommendations  Prolonged QTc QTc 516 ms Avoid QT prolonging drugs Magnesium level will be checked Continue telemetry  Macrocytic anemia History of B12 deficiency MCV 102.7, H/H11.2/34.7 Vitamin B12 and folate levels will be checked Continue patient's monthly injection of B12  Right lower extremity wound Wound currently bandaged with Ace wrap, patient states that it was changed today at the wound clinic Continue wound care  Paroxysmal atrial fibrillation Continue amiodarone and Eliquis   Sick sinus syndrome Patient has permanent pacemaker placement which was last reviewed on 10/12/2021  Essential hypertension Continue Lasix  Venous thromboembolism Continue Eliquis  GERD Continue Protonix   DVT  prophylaxis: Eliquis  Code Status: Full code  Consults: Cardiology  Family Communication: None at bedside  Severity of Illness: The appropriate patient status for this patient is OBSERVATION. Observation status is judged to be reasonable and necessary in order to provide the required intensity of service to ensure the patient's safety. The patient's presenting symptoms, physical exam findings, and initial radiographic and laboratory data in the context of their medical condition is felt to place them at decreased risk for further clinical deterioration. Furthermore, it is anticipated that the patient will be medically stable for discharge from the hospital within 2 midnights of admission.   Author: Bernadette Hoit, DO 11/24/2021 3:08 AM  For on call review [email or web address].

## 2021-11-24 NOTE — Assessment & Plan Note (Signed)
Pt follows Dr. Rayann Heman -Rate controlled -Continue amiodarone -Continue apixaban

## 2021-11-24 NOTE — Progress Notes (Signed)
   11/24/21 1900  ReDS Vest / Clip  Station Marker A  Ruler Value 29  ReDS Value Range < 36  ReDS Actual Value 27

## 2021-11-24 NOTE — Assessment & Plan Note (Addendum)
BP stable No longer on any hypertensive medications at home

## 2021-11-24 NOTE — Progress Notes (Signed)
Patient has order for wound care, messaged Dr. Josephine Cables and Informed to hold off until wound nurse is able to do consult and put in plan of care for wound.

## 2021-11-24 NOTE — Hospital Course (Signed)
88 year old female with history of HFpEF, sick sinus syndrome and complete heart block status post PPM, recurrent DVT on apixaban, persistent atrial fibrillation, hyperlipidemia, hypertension presenting with 1 week history of generalized weakness and dyspnea on exertion.  The patient states that she has noticed some mild increase in her lower extremity edema.  Notably, the patient went on vacation to the beach about 1 week prior to this admission when she endorsed some dietary indiscretion and drinking more fluid than usual.  Otherwise, the patient is vigilant about her exact fluid intake and dietary restrictions.  She states that she has gained about at least 3 pounds since she has been home from vacation.  She denies any fevers, chills, chest pain, nausea, vomiting or diarrhea, abdominal pain. In the ED, the patient was afebrile and hemodynamically stable with oxygen saturation 90% room air.  BMP showed a sodium 133, potassium 4.4, serum creatinine 0.97.  Chest x-ray did not show any frank pulmonary edema.  Troponin 11>> 12.  LFTs were unremarkable.  UA was negative for pyuria.  BNP was 630.  The patient was started on IV furosemide.

## 2021-11-24 NOTE — Consult Note (Signed)
WOC Nurse Consult Note: Reason for Consult:RLE with Unna's Boot Wound type:Venous insufficiency Pressure Injury POA: N/A Measurement: Unable to measure today Wound BVA:POLIDC to visualize today Drainage (amount, consistency, odor) None Periwound:Unable to visualize today Dressing procedure/placement/frequency:  WOC Nursing is consulted to assess RLE wound beneath [employer].  Patient refused to allow Nursing to remove for their admission assessment stating that the dressing had just been put on. No note in EHR related to an encounter for wound care in the recent past.  This Probation officer contacted the patient's son, Rhylee Pucillo at [phone], who informed her that the patient was seen at [facility] on Tuesday, 11/22/21 where they applied an [employer]. She returned yesterday for them to perform a vascular ultrasound and at that time instructed the patient to leave the [facility] in place for another week.  She may take the boot off on Tuesday, 11/29/21 if desired and wash and dry the leg OR she may leave in place until her appointment on 12/01/21 when the staff at [facility] will remove it. I have communicated this finding to Dr. Carles Collet and he is in agreement with the POC.  Orders entered for Nursing to leave RLE Rolena Infante in place.   [place] nursing team will not follow, but will remain available to this patient, the nursing and medical teams.  Please re-consult if needed.  Thank you for inviting [place] to participate in this patient's Plan of Care.  Maudie Flakes, MSN, RN, CNS, [place], Serita Grammes, [employer], [employer] phone:  [phone]

## 2021-11-24 NOTE — Assessment & Plan Note (Signed)
Status post PPM Patient had recent generator change March 2023 Follow-up Dr. Rayann Heman

## 2021-11-24 NOTE — Assessment & Plan Note (Addendum)
Started on IV furosemide>>home with lasix 40 mg po daily Daily weights 6/15 Echo--EF 60-65%, no WMA Obtain ReDS reading--27 07/02/2021 echo-EF 60 to 65%, no WMA, PASP 53.7, moderate TR, mild to moderate AS Discharge weight 152 (standing)

## 2021-11-24 NOTE — Assessment & Plan Note (Signed)
Optimize electrolytes 

## 2021-11-25 LAB — BASIC METABOLIC PANEL
Anion gap: 7 (ref 5–15)
BUN: 23 mg/dL (ref 8–23)
CO2: 28 mmol/L (ref 22–32)
Calcium: 8.9 mg/dL (ref 8.9–10.3)
Chloride: 101 mmol/L (ref 98–111)
Creatinine, Ser: 0.9 mg/dL (ref 0.44–1.00)
GFR, Estimated: >60 mL/min (ref >60)
Glucose, Bld: 92 mg/dL (ref 70–99)
Potassium: 3.5 mmol/L (ref 3.5–5.1)
Sodium: 136 mmol/L (ref 135–145)

## 2021-11-25 LAB — MAGNESIUM: Magnesium: 1.9 mg/dL (ref 1.7–2.4)

## 2021-11-25 MED ORDER — FUROSEMIDE 40 MG PO TABS
40.0000 mg | ORAL_TABLET | Freq: Every day | ORAL | Status: DC
  Filled 2021-11-25: qty 1

## 2021-11-25 NOTE — Progress Notes (Signed)
Patient discharged home with discharge instructions and medication details went over with patient.family at bedside. Discharged home via personal vehicle

## 2021-11-25 NOTE — Discharge Summary (Signed)
Physician Discharge Summary   Patient: [name] MRN: [id number] DOB: 04/24/1929  Admit date:     11/23/2021  Discharge date: 11/25/21  Discharge Physician: Shanon Brow Katrine Radich   PCP: Dettinger, Fransisca Kaufmann, MD   Recommendations at discharge:   Please follow up with primary care provider within 1-2 weeks  Please repeat BMP and CBC in one week      Hospital Course: 88 year old female with history of HFpEF, sick sinus syndrome and complete heart block status post PPM, recurrent DVT on apixaban, persistent atrial fibrillation, hyperlipidemia, hypertension presenting with 1 week history of generalized weakness and dyspnea on exertion.  The patient states that she has noticed some mild increase in her lower extremity edema.  Notably, the patient went on vacation to the beach about 1 week prior to this admission when she endorsed some dietary indiscretion and drinking more fluid than usual.  Otherwise, the patient is vigilant about her exact fluid intake and dietary restrictions.  She states that she has gained about at least 3 pounds since she has been home from vacation.  She denies any fevers, chills, chest pain, nausea, vomiting or diarrhea, abdominal pain. In the ED, the patient was afebrile and hemodynamically stable with oxygen saturation 90% room air.  BMP showed a sodium 133, potassium 4.4, serum creatinine 0.97.  Chest x-ray did not show any frank pulmonary edema.  Troponin 11>> 12.  LFTs were unremarkable.  UA was negative for pyuria.  BNP was 630.  The patient was started on IV furosemide.  Assessment and Plan: * Acute on chronic diastolic CHF (congestive heart failure) (HCC) Started on IV furosemide>>home with lasix 40 mg po daily Daily weights 6/15 Echo--EF 60-65%, no WMA Obtain ReDS reading--27 07/02/2021 echo-EF 60 to 65%, no WMA, PASP 53.7, moderate TR, mild to moderate AS Discharge weight 152 (standing)  Open wound of right lower extremity without complication UNNA boot placed  by her vascular surgeon on 11/23/2021 Do not plan to remove at this time It is planned to be changed on 12/01/2021  Prolonged QT interval Optimize electrolytes  B12 deficiency Continue monthly supplementation in the outpatient setting  Persistent atrial fibrillation ([place]) Pt follows Dr. Rayann Heman -Rate controlled -Continue amiodarone -Continue apixaban  Sick sinus syndrome ([place]) Status post PPM Patient had recent generator change March 2023 Follow-up Dr. Rayann Heman  Essential hypertension, benign BP stable No longer on any hypertensive medications at home         Consultants: none Procedures performed: none  Disposition: Home Diet recommendation:  Cardiac diet DISCHARGE MEDICATION: Allergies as of 11/25/2021       Reactions   Iodinated Contrast Media Hives, Itching, Rash   03/13/14: symptoms began within two hours of intrathecal injection (myelogram 03/11/14); on shoulders radiating down to stomach.  Relief with Benadryl.  Told patient she would need to pre-med with Benadryl in the future before receiving this contrast.  Brita Romp, RN   Naproxen Hives, Itching, Rash        Medication List     TAKE these medications    acetaminophen 500 MG tablet Commonly known as: TYLENOL Take 1,000 mg by mouth every 6 (six) hours as needed for mild pain.   amiodarone 200 MG tablet Commonly known as: PACERONE Take 1 tablet (200 mg total) by mouth daily.   apixaban 5 MG Tabs tablet Commonly known as: Eliquis Take 1 tablet (5 mg total) by mouth 2 (two) times daily.   Benefiber Powd Take 1 Dose by mouth daily as needed (  constipation).   calcium-vitamin D 500-200 MG-UNIT tablet Commonly known as: OSCAL WITH D Take 1 tablet by mouth daily with breakfast.   cyanocobalamin 1000 MCG/ML injection Commonly known as: (VITAMIN B-12) Inject 1 mL (1,000 mcg total) into the muscle every 30 (thirty) days.   furosemide 20 MG tablet Commonly known as: LASIX TAKE ONE (1) TABLET BY  MOUTH EVERY DAY What changed: how much to take   hydrocortisone 1 % lotion Apply 1 application. topically 2 (two) times daily.   latanoprost 0.005 % ophthalmic solution Commonly known as: XALATAN Place 1 drop into both eyes at bedtime.   Lidocaine HCl 4 % Crea Apply 1 application. topically daily.   multivitamin tablet Take 1 tablet by mouth every morning.   nitroGLYCERIN 0.4 MG SL tablet Commonly known as: NITROSTAT DISSOLVE 1 TAB UNDER TOUNGE FOR CHEST PAIN. MAY REPEAT EVERY 5 MINUTES FOR 3 DOSES. IF NO RELIEF CALL 911 OR GO TO ER What changed: See the new instructions.   pantoprazole 40 MG tablet Commonly known as: PROTONIX Take 1 tablet (40 mg total) by mouth daily.        Discharge Exam: Filed Weights   11/23/21 1929 11/24/21 0126 11/25/21 0455  Weight: 70.4 kg 70.6 kg 70.6 kg   HEENT:  [place]/AT, No thrush, no icterus CV:  RRR, no rub, no S3, no S4 Lung:  CTA, no wheeze, no rhonchi Abd:  soft/+BS, NT Ext:  No edema, no lymphangitis, no synovitis, no rash   Condition at discharge: stable  The results of significant diagnostics from this hospitalization (including imaging, microbiology, ancillary and laboratory) are listed below for reference.   Imaging Studies: ECHOCARDIOGRAM LIMITED  Result Date: 11/24/2021    ECHOCARDIOGRAM LIMITED REPORT   Patient Name:   CHAKA BOYSON Seidman Date of Exam: 11/24/2021 Medical Rec #:  [id number]           Height:       63.0 in Accession #:    [id number]          Weight:       155.6 lb Date of Birth:  03/09/1929           BSA:          1.738 m Patient Age:    88 years            BP:           113/67 mmHg Patient Gender: F                   HR:           60 bpm. Exam Location:  [facility] Procedure: Limited Echo Indications:    CHF  History:        Patient has prior history of Echocardiogram examinations, most                 recent 07/03/2021. CHF, CAD, Pacemaker, Pulmonary HTN,                 Arrythmias:Atrial Fibrillation; Risk  Factors:Hypertension and                 Dyslipidemia. Breast CA.  Sonographer:    Wenda Low Referring Phys: [id number] OLADAPO ADEFESO IMPRESSIONS  1. Limited study with limited images  2. Left ventricular ejection fraction, by estimation, is 60 to 65%. Left ventricular ejection fraction by 2D MOD biplane is 63.6 %. The left ventricle has normal function. The left ventricle has no regional wall motion  abnormalities. There is moderate left ventricular hypertrophy.  3. The right ventricular size is mildly enlarged. Tricuspid regurgitation signal is inadequate for assessing PA pressure.  4. Right atrial size was severely dilated.  5. The aortic valve is tricuspid. Aortic valve regurgitation is not visualized. Aortic valve sclerosis/calcification is present, without any evidence of aortic stenosis.  6. The inferior vena cava is dilated in size with <50% respiratory variability, suggesting right atrial pressure of 15 mmHg. Comparison(s): No significant change from prior study. 07/03/2021: LVEF 60-65%. FINDINGS  Left Ventricle: Left ventricular ejection fraction, by estimation, is 60 to 65%. Left ventricular ejection fraction by 2D MOD biplane is 63.6 %. The left ventricle has normal function. The left ventricle has no regional wall motion abnormalities. The left ventricular internal cavity size was normal in size. There is moderate left ventricular hypertrophy. Right Ventricle: The right ventricular size is mildly enlarged. Tricuspid regurgitation signal is inadequate for assessing PA pressure. Left Atrium: Left atrial size was normal in size. Right Atrium: Right atrial size was severely dilated. Aortic Valve: The aortic valve is tricuspid. Aortic valve regurgitation is not visualized. Aortic valve sclerosis/calcification is present, without any evidence of aortic stenosis. Aorta: The aortic root and ascending aorta are structurally normal, with no evidence of dilitation. Venous: The inferior vena cava is dilated in  size with less than 50% respiratory variability, suggesting right atrial pressure of 15 mmHg. IAS/Shunts: No atrial level shunt detected by color flow Doppler. Additional Comments: A device lead is visualized. LEFT VENTRICLE PLAX 2D                        Biplane EF (MOD) LVIDd:         4.60 cm         LV Biplane EF:   Left LVIDs:         3.40 cm                          ventricular LV PW:         1.50 cm                          ejection LV IVS:        1.30 cm                          fraction by LVOT diam:     2.00 cm                          2D MOD LVOT Area:     3.14 cm                         biplane is                                                 63.6 %.  LV Volumes (MOD) LV vol d, MOD    58.2 ml A2C: LV vol d, MOD    52.8 ml A4C: LV vol s, MOD    24.2 ml A2C: LV vol s, MOD    16.6 ml A4C: LV SV MOD A2C:   34.0 ml LV SV MOD  A4C:   52.8 ml LV SV MOD BP:    35.2 ml RIGHT VENTRICLE RV Basal diam:  4.10 cm RV Mid diam:    3.80 cm TAPSE (M-mode): 2.4 cm LEFT ATRIUM             Index        RIGHT ATRIUM           Index LA diam:        4.70 cm 2.70 cm/m   RA Area:     25.80 cm LA Vol (A2C):   52.8 ml 30.38 ml/m  RA Volume:   92.60 ml  53.27 ml/m LA Vol (A4C):   52.2 ml 30.03 ml/m LA Biplane Vol: 55.8 ml 32.10 ml/m   AORTA Ao Root diam: 3.50 cm Ao Asc diam:  3.60 cm  SHUNTS Systemic Diam: 2.00 cm Lyman Bishop MD Electronically signed by Lyman Bishop MD Signature Date/Time: 11/24/2021/4:38:34 PM    Final    DG Chest 2 View  Result Date: 11/23/2021 CLINICAL DATA:  Chest pressure, dyspnea, CHF EXAM: CHEST - 2 VIEW COMPARISON:  07/02/2021 FINDINGS: Lungs are clear. No pneumothorax or pleural effusion. Right subclavian dual lead pacemaker is unchanged. Mild cardiomegaly is stable. Pulmonary vascularity is normal. No acute bone abnormality. Remote healed left humeral head fracture deformity again noted. IMPRESSION: No active cardiopulmonary disease. Electronically Signed   By: Fidela Salisbury M.D.   On:  11/23/2021 21:11   HM MAMMOGRAPHY  Negative // [facility]   Microbiology: Results for orders placed or performed during the hospital encounter of 11/23/21  Resp Panel by RT-PCR (Flu A&B, Covid) Anterior Nasal Swab     Status: None   Collection Time: 11/23/21  8:58 PM   Specimen: Anterior Nasal Swab  Result Value Ref Range Status   SARS Coronavirus 2 by RT PCR NEGATIVE NEGATIVE Final    Comment: (NOTE) SARS-CoV-2 target nucleic acids are NOT DETECTED.  The SARS-CoV-2 RNA is generally detectable in upper respiratory specimens during the acute phase of infection. The lowest concentration of SARS-CoV-2 viral copies this assay can detect is 138 copies/mL. A negative result does not preclude SARS-Cov-2 infection and should not be used as the sole basis for treatment or other patient management decisions. A negative result may occur with  improper specimen collection/handling, submission of specimen other than nasopharyngeal swab, presence of viral mutation(s) within the areas targeted by this assay, and inadequate number of viral copies(<138 copies/mL). A negative result must be combined with clinical observations, patient history, and epidemiological information. The expected result is Negative.  Fact Sheet for Patients:  EntrepreneurPulse.com.au  Fact Sheet for Healthcare Providers:  IncredibleEmployment.be  This test is no t yet approved or cleared by the [place] FDA and  has been authorized for detection and/or diagnosis of SARS-CoV-2 by FDA under an Emergency Use Authorization (EUA). This EUA will remain  in effect (meaning this test can be used) for the duration of the COVID-19 declaration under Section 564(b)(1) of the Act, 21 U.S.C.section 360bbb-3(b)(1), unless the authorization is terminated  or revoked sooner.       Influenza A by PCR NEGATIVE NEGATIVE Final   Influenza B by PCR NEGATIVE NEGATIVE Final    Comment: (NOTE) The Xpert  Xpress SARS-CoV-2/FLU/RSV plus assay is intended as an aid in the diagnosis of influenza from Nasopharyngeal swab specimens and should not be used as a sole basis for treatment. Nasal washings and aspirates are unacceptable for Xpert Xpress SARS-CoV-2/FLU/RSV testing.  Fact Sheet for  Patients: EntrepreneurPulse.com.au  Fact Sheet for Healthcare Providers: IncredibleEmployment.be  This test is not yet approved or cleared by the [place] FDA and has been authorized for detection and/or diagnosis of SARS-CoV-2 by FDA under an Emergency Use Authorization (EUA). This EUA will remain in effect (meaning this test can be used) for the duration of the COVID-19 declaration under Section 564(b)(1) of the Act, 21 U.S.C. section 360bbb-3(b)(1), unless the authorization is terminated or revoked.  Performed at [facility], [address], [place], [place] [address]     Labs: CBC: Recent Labs  Lab 11/23/21 1950 11/24/21 0453  WBC 4.3 3.8*  HGB 11.2* 10.3*  HCT 34.7* 31.4*  MCV 102.7* 102.3*  PLT 135* 681*   Basic Metabolic Panel: Recent Labs  Lab 11/23/21 1950 11/24/21 0453 11/25/21 0345  NA 133* 139 136  K 4.4 3.9 3.5  CL 98 103 101  CO2 '27 30 28  '$ GLUCOSE 117* 95 92  BUN 23 24* 23  CREATININE 0.97 0.84 0.90  CALCIUM 9.2 9.2 8.9  MG  --  2.1 1.9  PHOS  --  3.4  --    Liver Function Tests: Recent Labs  Lab 11/23/21 1950 11/24/21 0453  AST 23 19  ALT 21 18  ALKPHOS 68 59  BILITOT 0.8 1.0  PROT 7.1 6.3*  ALBUMIN 3.9 3.4*   CBG: Recent Labs  Lab 11/23/21 1946  GLUCAP 102*    Discharge time spent: greater than 30 minutes.  Signed: Orson Eva, MD [facility] 11/25/2021

## 2021-11-28 ENCOUNTER — Telehealth: Payer: Self-pay

## 2021-11-28 NOTE — Telephone Encounter (Signed)
Transition Care Management Follow-up Telephone Call Date of discharge and from where: [facility] - 11/25/21 - acute on chronic CHF How have you been since you were released from the hospital? A little better, just tired Any questions or concerns? No  Items Reviewed: Did the pt receive and understand the discharge instructions provided? Yes  Medications obtained and verified? Yes  Other? No  Any new allergies since your discharge? No  Dietary orders reviewed? Yes Do you have support at home? Yes   Home Care and Equipment/Supplies: Were home health services ordered? no  Were any new equipment or medical supplies ordered?  No  Functional Questionnaire: (I = Independent and D = Dependent) ADLs: I  Bathing/Dressing- I  Meal Prep- I  Eating- I  Maintaining continence- I  Transferring/Ambulation- I  Managing Meds- I  Follow up appointments reviewed:  PCP Hospital f/u appt confirmed? Yes  Scheduled to see Dettinger on 12/08/21 @ 8:55. [place] [facility] f/u appt confirmed? Yes  Scheduled to see cardiology on 12/01/21. Are transportation arrangements needed? No  If their condition worsens, is the pt aware to call PCP or go to the Emergency Dept.? Yes Was the patient provided with contact information for the PCP's office or ED? Yes Was to pt encouraged to call back with questions or concerns? Yes

## 2021-12-01 DIAGNOSIS — L97219 Non-pressure chronic ulcer of right calf with unspecified severity: Secondary | ICD-10-CM | POA: Diagnosis not present

## 2021-12-05 ENCOUNTER — Other Ambulatory Visit: Payer: Self-pay | Admitting: Family Medicine

## 2021-12-08 ENCOUNTER — Ambulatory Visit (INDEPENDENT_AMBULATORY_CARE_PROVIDER_SITE_OTHER): Payer: Medicare Other | Admitting: Family Medicine

## 2021-12-08 ENCOUNTER — Encounter: Payer: Self-pay | Admitting: Family Medicine

## 2021-12-08 VITALS — BP 126/78 | HR 75 | Temp 98.0°F | Ht 63.0 in | Wt 154.0 lb

## 2021-12-08 DIAGNOSIS — I5033 Acute on chronic diastolic (congestive) heart failure: Secondary | ICD-10-CM | POA: Diagnosis not present

## 2021-12-08 DIAGNOSIS — L97219 Non-pressure chronic ulcer of right calf with unspecified severity: Secondary | ICD-10-CM | POA: Diagnosis not present

## 2021-12-08 DIAGNOSIS — I480 Paroxysmal atrial fibrillation: Secondary | ICD-10-CM | POA: Diagnosis not present

## 2021-12-08 MED ORDER — APIXABAN 5 MG PO TABS: 5.0000 mg | ORAL_TABLET | Freq: Two times a day (BID) | ORAL | 1 refills | Status: DC

## 2021-12-08 MED ORDER — FUROSEMIDE 40 MG PO TABS: 40.0000 mg | ORAL_TABLET | Freq: Every day | ORAL | 1 refills | Status: DC

## 2021-12-08 NOTE — Progress Notes (Signed)
BP 126/78   Pulse 75   Temp 98 F (36.7 C)   Ht $R'5\' 3"'VN$  (1.6 m)   Wt 154 lb (69.9 kg)   SpO2 97%   BMI 27.28 kg/m    Subjective:   Patient ID: [name], female    DOB: 1929-04-24, 88 y.o.   MRN: [id number]  HPI: [name] is a 88 y.o. female presenting on 12/08/2021 for Hospitalization Follow-up (D/C from [facility] 6/16 CHF)   HPI Transition Care Management Follow-up Telephone Call Date of discharge and from where: [facility] - 11/25/21 - acute on chronic CHF How have you been since you were released from the hospital? A little better, just tired Any questions or concerns? No Patient contacted by Adalberto Cole LPN on 09/23/2438   Transition of care office visit Patient is coming in today for transition of care office visit.  Patient was admitted on 11/23/2021 and discharged on 11/25/2021 from the hospital.  During the hospital she was diagnosed with acute on chronic congestive heart failure and started on diuretics.  She was ejection fraction preserved so his diastolic heart failure.  Since leaving the hospital her weight has been stable at around 1 53-1 55 and she is continue to take the diuretic increased that they gave her.  She feels like she is doing well on it.  Her son is here with her and feels like she is doing a lot better as well.  She denies any shortness of breath or wheezing.  She denies any side effects from the increase of the diuretic.  She is cutting back on her sodium and trying to be more strict about keeping that under control.  Relevant past medical, surgical, family and social history reviewed and updated as indicated. Interim medical history since our last visit reviewed. Allergies and medications reviewed and updated.  Review of Systems  Constitutional:  Negative for chills and fever.  Eyes:  Negative for visual disturbance.  Respiratory:  Negative for chest tightness and shortness of breath.   Cardiovascular:  Negative for chest pain and leg  swelling.  Musculoskeletal:  Negative for back pain and gait problem.  Skin:  Negative for rash.  Neurological:  Negative for dizziness, light-headedness and headaches.  Psychiatric/Behavioral:  Negative for agitation and behavioral problems.   All other systems reviewed and are negative.   Per HPI unless specifically indicated above   Allergies as of 12/08/2021       Reactions   Iodinated Contrast Media Hives, Itching, Rash   03/13/14: symptoms began within two hours of intrathecal injection (myelogram 03/11/14); on shoulders radiating down to stomach.  Relief with Benadryl.  Told patient she would need to pre-med with Benadryl in the future before receiving this contrast.  Brita Romp, RN   Naproxen Hives, Itching, Rash        Medication List        Accurate as of December 08, 2021  9:18 AM. If you have any questions, ask your nurse or doctor.          acetaminophen 500 MG tablet Commonly known as: TYLENOL Take 1,000 mg by mouth every 6 (six) hours as needed for mild pain.   amiodarone 200 MG tablet Commonly known as: PACERONE Take 1 tablet (200 mg total) by mouth daily.   apixaban 5 MG Tabs tablet Commonly known as: Eliquis Take 1 tablet (5 mg total) by mouth 2 (two) times daily.   Benefiber Powd Take 1 Dose by mouth daily  as needed (constipation).   calcium-vitamin D 500-200 MG-UNIT tablet Commonly known as: OSCAL WITH D Take 1 tablet by mouth daily with breakfast.   cyanocobalamin 1000 MCG/ML injection Commonly known as: (VITAMIN B-12) Inject 1 mL (1,000 mcg total) into the muscle every 30 (thirty) days.   furosemide 20 MG tablet Commonly known as: LASIX TAKE 1 TABLET EVERY DAY   furosemide 40 MG tablet Commonly known as: LASIX Take 1 tablet (40 mg total) by mouth daily.   hydrocortisone 1 % lotion Apply 1 application. topically 2 (two) times daily.   latanoprost 0.005 % ophthalmic solution Commonly known as: XALATAN Place 1 drop into both eyes at  bedtime.   Lidocaine HCl 4 % Crea Apply 1 application. topically daily.   multivitamin tablet Take 1 tablet by mouth every morning.   nitroGLYCERIN 0.4 MG SL tablet Commonly known as: NITROSTAT DISSOLVE 1 TAB UNDER TOUNGE FOR CHEST PAIN. MAY REPEAT EVERY 5 MINUTES FOR 3 DOSES. IF NO RELIEF CALL 911 OR GO TO ER What changed: See the new instructions.   pantoprazole 40 MG tablet Commonly known as: PROTONIX Take 1 tablet (40 mg total) by mouth daily.         Objective:   BP 126/78   Pulse 75   Temp 98 F (36.7 C)   Ht $R'5\' 3"'aC$  (1.6 m)   Wt 154 lb (69.9 kg)   SpO2 97%   BMI 27.28 kg/m   Wt Readings from Last 3 Encounters:  12/08/21 154 lb (69.9 kg)  11/25/21 155 lb 11.2 oz (70.6 kg)  10/26/21 155 lb 3.2 oz (70.4 kg)    Physical Exam Vitals and nursing note reviewed.  Constitutional:      General: She is not in acute distress.    Appearance: She is well-developed. She is not diaphoretic.  Eyes:     Conjunctiva/sclera: Conjunctivae normal.  Cardiovascular:     Rate and Rhythm: Normal rate and regular rhythm.     Heart sounds: Normal heart sounds. No murmur heard. Pulmonary:     Effort: Pulmonary effort is normal. No respiratory distress.     Breath sounds: Normal breath sounds. No wheezing, rhonchi or rales.  Musculoskeletal:        General: No swelling or tenderness. Normal range of motion.  Skin:    General: Skin is warm and dry.     Findings: No rash.  Neurological:     Mental Status: She is alert and oriented to person, place, and time.     Coordination: Coordination normal.  Psychiatric:        Behavior: Behavior normal.       Assessment & Plan:   Problem List Items Addressed This Visit       Cardiovascular and Mediastinum   Acute on chronic diastolic CHF (congestive heart failure) (HCC) - Primary   Relevant Medications   furosemide (LASIX) 40 MG tablet   apixaban (ELIQUIS) 5 MG TABS tablet   Other Relevant Orders   CBC with  Differential/Platelet   CMP14+EGFR   Other Visit Diagnoses     Paroxysmal atrial fibrillation (HCC)       Relevant Medications   furosemide (LASIX) 40 MG tablet   apixaban (ELIQUIS) 5 MG TABS tablet   Other Relevant Orders   CBC with Differential/Platelet   CMP14+EGFR       Recheck renal function with increased furosemide, continue to keep an eye on the weights.  Continue to watch sodium restriction and it sounds like she is  doing well with that. Follow up plan: Return if symptoms worsen or fail to improve, for Routine follow-up for normal checkup which is usually every 6 months.  Counseling provided for all of the vaccine components Orders Placed This Encounter  Procedures   CBC with Differential/Platelet   [place] Myrtha Tonkovich, MD [facility] 12/08/2021, 9:18 AM

## 2021-12-08 NOTE — Patient Instructions (Signed)
Low-Sodium Eating Plan Sodium, which is an element that makes up salt, helps you maintain a healthy balance of fluids in your body. Too much sodium can increase your blood pressure and cause fluid and waste to be held in your body. Your health care provider or dietitian may recommend following this plan if you have high blood pressure (hypertension), kidney disease, liver disease, or heart failure. Eating less sodium can help lower your blood pressure, reduce swelling, and protect your heart, liver, and kidneys. What are tips for following this plan? Reading food labels The Nutrition Facts label lists the amount of sodium in one serving of the food. If you eat more than one serving, you must multiply the listed amount of sodium by the number of servings. Choose foods with less than 140 mg of sodium per serving. Avoid foods with 300 mg of sodium or more per serving. Shopping  Look for lower-sodium products, often labeled as "low-sodium" or "no salt added." Always check the sodium content, even if foods are labeled as "unsalted" or "no salt added." Buy fresh foods. Avoid canned foods and pre-made or frozen meals. Avoid canned, cured, or processed meats. Buy breads that have less than 80 mg of sodium per slice. Cooking  Eat more home-cooked food and less restaurant, buffet, and fast food. Avoid adding salt when cooking. Use salt-free seasonings or herbs instead of table salt or sea salt. Check with your health care provider or pharmacist before using salt substitutes. Cook with plant-based oils, such as canola, sunflower, or olive oil. Meal planning When eating at a restaurant, ask that your food be prepared with less salt or no salt, if possible. Avoid dishes labeled as brined, pickled, cured, smoked, or made with soy sauce, miso, or teriyaki sauce. Avoid foods that contain MSG (monosodium glutamate). MSG is sometimes added to Chinese food, bouillon, and some canned foods. Make meals that can  be grilled, baked, poached, roasted, or steamed. These are generally made with less sodium. General information Most people on this plan should limit their sodium intake to 1,500-2,000 mg (milligrams) of sodium each day. What foods should I eat? Fruits Fresh, frozen, or canned fruit. Fruit juice. Vegetables Fresh or frozen vegetables. "No salt added" canned vegetables. "No salt added" tomato sauce and paste. Low-sodium or reduced-sodium tomato and vegetable juice. Grains Low-sodium cereals, including oats, puffed wheat and rice, and shredded wheat. Low-sodium crackers. Unsalted rice. Unsalted pasta. Low-sodium bread. Whole-grain breads and whole-grain pasta. Meats and other proteins Fresh or frozen (no salt added) meat, poultry, seafood, and fish. Low-sodium canned tuna and salmon. Unsalted nuts. Dried peas, beans, and lentils without added salt. Unsalted canned beans. Eggs. Unsalted nut butters. Dairy Milk. Soy milk. Cheese that is naturally low in sodium, such as ricotta cheese, fresh mozzarella, or Swiss cheese. Low-sodium or reduced-sodium cheese. Cream cheese. Yogurt. Seasonings and condiments Fresh and dried herbs and spices. Salt-free seasonings. Low-sodium mustard and ketchup. Sodium-free salad dressing. Sodium-free light mayonnaise. Fresh or refrigerated horseradish. Lemon juice. Vinegar. Other foods Homemade, reduced-sodium, or low-sodium soups. Unsalted popcorn and pretzels. Low-salt or salt-free chips. The items listed above may not be a complete list of foods and beverages you can eat. Contact a dietitian for more information. What foods should I avoid? Vegetables Sauerkraut, pickled vegetables, and relishes. Olives. French fries. Onion rings. Regular canned vegetables (not low-sodium or reduced-sodium). Regular canned tomato sauce and paste (not low-sodium or reduced-sodium). Regular tomato and vegetable juice (not low-sodium or reduced-sodium). Frozen vegetables in  sauces. Grains   Instant hot cereals. Bread stuffing, pancake, and biscuit mixes. Croutons. Seasoned rice or pasta mixes. Noodle soup cups. Boxed or frozen macaroni and cheese. Regular salted crackers. Self-rising flour. Meats and other proteins Meat or fish that is salted, canned, smoked, spiced, or pickled. Precooked or cured meat, such as sausages or meat loaves. Bacon. Ham. Pepperoni. Hot dogs. Corned beef. Chipped beef. Salt pork. Jerky. Pickled herring. Anchovies and sardines. Regular canned tuna. Salted nuts. Dairy Processed cheese and cheese spreads. Hard cheeses. Cheese curds. Blue cheese. Feta cheese. String cheese. Regular cottage cheese. Buttermilk. Canned milk. Fats and oils Salted butter. Regular margarine. Ghee. Bacon fat. Seasonings and condiments Onion salt, garlic salt, seasoned salt, table salt, and sea salt. Canned and packaged gravies. Worcestershire sauce. Tartar sauce. Barbecue sauce. Teriyaki sauce. Soy sauce, including reduced-sodium. Steak sauce. Fish sauce. Oyster sauce. Cocktail sauce. Horseradish that you find on the shelf. Regular ketchup and mustard. Meat flavorings and tenderizers. Bouillon cubes. Hot sauce. Pre-made or packaged marinades. Pre-made or packaged taco seasonings. Relishes. Regular salad dressings. Salsa. Other foods Salted popcorn and pretzels. Corn chips and puffs. Potato and tortilla chips. Canned or dried soups. Pizza. Frozen entrees and pot pies. The items listed above may not be a complete list of foods and beverages you should avoid. Contact a dietitian for more information. Summary Eating less sodium can help lower your blood pressure, reduce swelling, and protect your heart, liver, and kidneys. Most people on this plan should limit their sodium intake to 1,500-2,000 mg (milligrams) of sodium each day. Canned, boxed, and frozen foods are high in sodium. Restaurant foods, fast foods, and pizza are also very high in sodium. You also get sodium by  adding salt to food. Try to cook at home, eat more fresh fruits and vegetables, and eat less fast food and canned, processed, or prepared foods. This information is not intended to replace advice given to you by your health care provider. Make sure you discuss any questions you have with your health care provider. Document Revised: 07/04/2019 Document Reviewed: 04/30/2019 Elsevier Patient Education  2023 Elsevier Inc.  

## 2021-12-09 LAB — CMP14+EGFR
ALT: 13 IU/L (ref 0–32)
AST: 23 IU/L (ref 0–40)
Albumin/Globulin Ratio: 1.9 (ref 1.2–2.2)
Albumin: 4.4 g/dL (ref 3.5–4.6)
Alkaline Phosphatase: 78 IU/L (ref 44–121)
BUN/Creatinine Ratio: 15 (ref 12–28)
BUN: 16 mg/dL (ref 10–36)
Bilirubin Total: 0.8 mg/dL (ref 0.0–1.2)
CO2: 28 mmol/L (ref 20–29)
Calcium: 9.9 mg/dL (ref 8.7–10.3)
Chloride: 95 mmol/L — ABNORMAL LOW (ref 96–106)
Creatinine, Ser: 1.1 mg/dL — ABNORMAL HIGH (ref 0.57–1.00)
Globulin, Total: 2.3 g/dL (ref 1.5–4.5)
Glucose: 94 mg/dL (ref 70–99)
Potassium: 4.4 mmol/L (ref 3.5–5.2)
Sodium: 135 mmol/L (ref 134–144)
Total Protein: 6.7 g/dL (ref 6.0–8.5)
eGFR: 48 mL/min/{1.73_m2} — ABNORMAL LOW (ref >59)

## 2021-12-09 LAB — CBC WITH DIFFERENTIAL/PLATELET
Basophils Absolute: 0 10*3/uL (ref 0.0–0.2)
Basos: 1 %
EOS (ABSOLUTE): 0.1 10*3/uL (ref 0.0–0.4)
Eos: 4 %
Hematocrit: 33.8 % — ABNORMAL LOW (ref 34.0–46.6)
Hemoglobin: 11.5 g/dL (ref 11.1–15.9)
Immature Grans (Abs): 0 10*3/uL (ref 0.0–0.1)
Immature Granulocytes: 0 %
Lymphocytes Absolute: 1.2 10*3/uL (ref 0.7–3.1)
Lymphs: 33 %
MCH: 32.7 pg (ref 26.6–33.0)
MCHC: 34 g/dL (ref 31.5–35.7)
MCV: 96 fL (ref 79–97)
Monocytes Absolute: 0.5 10*3/uL (ref 0.1–0.9)
Monocytes: 13 %
Neutrophils Absolute: 1.7 10*3/uL (ref 1.4–7.0)
Neutrophils: 49 %
Platelets: 159 10*3/uL (ref 150–450)
RBC: 3.52 x10E6/uL — ABNORMAL LOW (ref 3.77–5.28)
RDW: 13 % (ref 11.7–15.4)
WBC: 3.5 10*3/uL (ref 3.4–10.8)

## 2021-12-12 ENCOUNTER — Other Ambulatory Visit: Payer: Self-pay | Admitting: Family Medicine

## 2021-12-12 DIAGNOSIS — I480 Paroxysmal atrial fibrillation: Secondary | ICD-10-CM

## 2021-12-12 DIAGNOSIS — K219 Gastro-esophageal reflux disease without esophagitis: Secondary | ICD-10-CM

## 2021-12-15 DIAGNOSIS — I83212 Varicose veins of right lower extremity with both ulcer of calf and inflammation: Secondary | ICD-10-CM | POA: Diagnosis not present

## 2021-12-20 ENCOUNTER — Ambulatory Visit: Payer: Self-pay | Admitting: *Deleted

## 2021-12-20 NOTE — Chronic Care Management (AMB) (Signed)
  Chronic Care Management   Note  12/20/2021 Name: [name] MRN: [id number] DOB: 09/01/1928   Patient has not recently engaged with the Chronic Care Management RN Care Manager. Removing RN Care Manager from Care Team and closing [place]. If patient is currently engaged with another CCM team member I will forward this encounter to inform them of my case closure. Patient may be eligible for re-engagement with RN Care Manager in the future if necessary and can discuss this with their PCP.  [name], BSN, RN-BC Embedded Chronic Care Manager [facility] Direct Dial: [phone]

## 2021-12-22 DIAGNOSIS — I872 Venous insufficiency (chronic) (peripheral): Secondary | ICD-10-CM | POA: Diagnosis not present

## 2021-12-28 DIAGNOSIS — I872 Venous insufficiency (chronic) (peripheral): Secondary | ICD-10-CM | POA: Diagnosis not present

## 2021-12-28 DIAGNOSIS — I83891 Varicose veins of right lower extremities with other complications: Secondary | ICD-10-CM | POA: Diagnosis not present

## 2022-01-02 ENCOUNTER — Ambulatory Visit (INDEPENDENT_AMBULATORY_CARE_PROVIDER_SITE_OTHER): Payer: Medicare Other

## 2022-01-02 DIAGNOSIS — I495 Sick sinus syndrome: Secondary | ICD-10-CM | POA: Diagnosis not present

## 2022-01-02 LAB — CUP PACEART REMOTE DEVICE CHECK
Battery Remaining Longevity: 110 mo
Battery Remaining Percentage: 95.5 %
Battery Voltage: 3.02 V
Brady Statistic AP VP Percent: 99 %
Brady Statistic AP VS Percent: 1 %
Brady Statistic AS VP Percent: 1 %
Brady Statistic AS VS Percent: 0 %
Brady Statistic RA Percent Paced: 99 %
Brady Statistic RV Percent Paced: 99 %
Date Time Interrogation Session: 20230724020015
Implantable Lead Implant Date: 20031028
Implantable Lead Implant Date: 20031028
Implantable Lead Location: 753859
Implantable Lead Location: 753860
Implantable Pulse Generator Implant Date: 20230421
Lead Channel Impedance Value: 360 Ohm
Lead Channel Impedance Value: 460 Ohm
Lead Channel Pacing Threshold Amplitude: 0.5 V
Lead Channel Pacing Threshold Amplitude: 0.5 V
Lead Channel Pacing Threshold Pulse Width: 0.5 ms
Lead Channel Pacing Threshold Pulse Width: 0.5 ms
Lead Channel Sensing Intrinsic Amplitude: 9.3 mV
Lead Channel Setting Pacing Amplitude: 0.75 V
Lead Channel Setting Pacing Amplitude: 2 V
Lead Channel Setting Pacing Pulse Width: 0.5 ms
Lead Channel Setting Sensing Sensitivity: 4 mV
Pulse Gen Model: 2272
Pulse Gen Serial Number: 8074688

## 2022-01-04 DIAGNOSIS — I872 Venous insufficiency (chronic) (peripheral): Secondary | ICD-10-CM | POA: Diagnosis not present

## 2022-01-09 DIAGNOSIS — M7989 Other specified soft tissue disorders: Secondary | ICD-10-CM | POA: Diagnosis not present

## 2022-01-09 DIAGNOSIS — I83891 Varicose veins of right lower extremities with other complications: Secondary | ICD-10-CM | POA: Diagnosis not present

## 2022-01-09 DIAGNOSIS — I83811 Varicose veins of right lower extremities with pain: Secondary | ICD-10-CM | POA: Diagnosis not present

## 2022-01-13 ENCOUNTER — Ambulatory Visit: Payer: Medicare Other

## 2022-01-17 ENCOUNTER — Encounter: Payer: Self-pay | Admitting: Cardiology

## 2022-01-17 ENCOUNTER — Ambulatory Visit: Payer: Medicare Other | Admitting: Cardiology

## 2022-01-17 VITALS — BP 102/68 | HR 64 | Ht 63.0 in | Wt 157.7 lb

## 2022-01-17 DIAGNOSIS — I5032 Chronic diastolic (congestive) heart failure: Secondary | ICD-10-CM

## 2022-01-17 DIAGNOSIS — I4819 Other persistent atrial fibrillation: Secondary | ICD-10-CM | POA: Diagnosis not present

## 2022-01-17 DIAGNOSIS — I495 Sick sinus syndrome: Secondary | ICD-10-CM | POA: Diagnosis not present

## 2022-01-17 MED ORDER — FUROSEMIDE 40 MG PO TABS: 40.0000 mg | ORAL_TABLET | Freq: Every day | ORAL | 1 refills | Status: DC

## 2022-01-17 NOTE — Patient Instructions (Addendum)
Medication Instructions:  Your physician recommends that you continue on your current medications as directed. Please refer to the Current Medication list given to you today. You may take an extra 20 mg furosemide daily as needed for leg swelling or weight gain (1/2 of your 40 mg tablet)  Labwork: none  Testing/Procedures: none  Follow-Up: Your physician recommends that you schedule a follow-up appointment in: 6 months  Any Other Special Instructions Will Be Listed Below (If Applicable).  If you need a refill on your cardiac medications before your next appointment, please call your pharmacy.

## 2022-01-17 NOTE — Progress Notes (Signed)
Cardiology Office Note  Date: 01/17/2022   ID: [name], DOB 03-30-1929, MRN [id number]  PCP:  Dettinger, Fransisca Kaufmann, MD  Cardiologist:  Rozann Lesches, MD Electrophysiologist:  Thompson Grayer, MD   Chief Complaint  Patient presents with   Cardiac follow-up    History of Present Illness: [name] is a 88 y.o. female last seen in October 2022 by Mr. Leonides Sake NP.  Records indicate hospitalization in June with weakness and shortness of breath.  She was treated for suspected acute on chronic HFpEF with follow-up echocardiogram noted below.  She is here today with her son for a follow-up visit.  Doing reasonably well, weight has been fairly stable on standing Lasix.  She does have intermittent leg swelling and we discussed using an extra dose in the afternoon as needed, also following weight regularly.  [employer] pacemaker in place with followed by EP.  Device check in July revealed normal function.  I reviewed the remainder of her medications.  Past Medical History:  Diagnosis Date   Arthritis    Atrial flutter ([place])    s/p RFA 2003   Breast cancer ([place])    1980's - lumpectomy only   Colon polyps    Diverticulosis    DVT, lower extremity ([place]) 1998 and 1999   Bilateral   Essential hypertension    Gallstones    GERD (gastroesophageal reflux disease)    Glaucoma    IBS (irritable bowel syndrome)    Mixed hyperlipidemia    Osteoporosis    Persistent atrial fibrillation ([place])    Pneumonia 2011   Sick sinus syndrome ([facility])    PPM ([facility])   Urinary tract infection    Varicose veins     Past Surgical History:  Procedure Laterality Date   BIOPSY  07/04/2021   Procedure: BIOPSY;  Surgeon: Thornton Park, MD;  Location: [facility];  Service: Gastroenterology;;   BREAST LUMPECTOMY  [phone]   Right breast    CARDIAC CATHETERIZATION  1999   Normal coronaries   Cataracts Bilateral    CHOLECYSTECTOMY     COLONOSCOPY W/ BIOPSIES AND POLYPECTOMY  03/2003,  05/2008, 02/20/2011   severe diverticulosis, tubulovillous adenoma polyp, internal and external hemorrhoids 2012: severe diverticulosis, 4 mm polyp, hemorrhoids   CYST REMOVAL TRUNK     under breast = on belly    DILATION AND CURETTAGE OF UTERUS     ESOPHAGOGASTRODUODENOSCOPY (EGD) WITH PROPOFOL N/A 07/04/2021   Procedure: ESOPHAGOGASTRODUODENOSCOPY (EGD) WITH PROPOFOL;  Surgeon: Thornton Park, MD;  Location: [facility];  Service: Gastroenterology;  Laterality: N/A;   EYE SURGERY     cataracts - bilateral    Fractured wrist Left 2013   repair   INSERT / REPLACE / REMOVE PACEMAKER     KNEE ARTHROSCOPY WITH MEDIAL MENISECTOMY Right 10/21/2014   Procedure: RIGHT KNEE ARTHROSCOPY WITH MEDIAL MENISECTOM,lateral menisectomy,synovectomy suprapatellar pouch;  Surgeon: Latanya Maudlin, MD;  Location: [facility];  Service: Orthopedics;  Laterality: Right;   PACEMAKER GENERATOR CHANGE  06/18/12   [facility] DR RF, Dr Rayann Heman   PACEMAKER INSERTION  2003   PERMANENT PACEMAKER GENERATOR CHANGE N/A 06/18/2012   Procedure: PERMANENT PACEMAKER GENERATOR CHANGE;  Surgeon: Thompson Grayer, MD;  Location: [facility] C[facility];  Service: Cardiovascular;  Laterality: N/A;   PPM GENERATOR CHANGEOUT N/A 09/30/2021   Procedure: PPM GENERATOR CHANGEOUT;  Surgeon: Thompson Grayer, MD;  Location: [facility] CV LAB;  Service: Cardiovascular;  Laterality: N/A;   TOTAL KNEE ARTHROPLASTY  12/20/2011  Procedure: TOTAL KNEE ARTHROPLASTY;  Surgeon: Tobi Bastos, MD;  Location: [facility];  Service: Orthopedics;  Laterality: Left;   TOTAL KNEE ARTHROPLASTY Right 02/10/2015   Procedure: TOTAL RIGHT KNEE ARTHROPLASTY;  Surgeon: Latanya Maudlin, MD;  Location: [facility];  Service: Orthopedics;  Laterality: Right;    Current Outpatient Medications  Medication Sig Dispense Refill   acetaminophen (TYLENOL) 500 MG tablet Take 1,000 mg by mouth every 6 (six) hours as needed for mild pain.     amiodarone (PACERONE) 200 MG tablet Take 1 tablet (200 mg  total) by mouth daily. 90 tablet 1   apixaban (ELIQUIS) 5 MG TABS tablet Take 1 tablet (5 mg total) by mouth 2 (two) times daily. 180 tablet 1   calcium-vitamin D (OSCAL WITH D) 500-200 MG-UNIT tablet Take 1 tablet by mouth daily with breakfast.      cyanocobalamin (,VITAMIN B-12,) 1000 MCG/ML injection Inject 1 mL (1,000 mcg total) into the muscle every 30 (thirty) days. 3 mL 1   hydrocortisone 1 % lotion Apply 1 application. topically 2 (two) times daily. 118 mL 0   latanoprost (XALATAN) 0.005 % ophthalmic solution Place 1 drop into both eyes at bedtime.     Lidocaine HCl 4 % CREA Apply 1 application. topically daily.     Multiple Vitamin (MULTIVITAMIN) tablet Take 1 tablet by mouth every morning.     nitroGLYCERIN (NITROSTAT) 0.4 MG SL tablet DISSOLVE 1 TAB UNDER TOUNGE FOR CHEST PAIN. MAY REPEAT EVERY 5 MINUTES FOR 3 DOSES. IF NO RELIEF CALL 911 OR GO TO ER (Patient taking differently: Place 0.4 mg under the tongue every 5 (five) minutes as needed for chest pain.) 25 tablet 3   pantoprazole (PROTONIX) 40 MG tablet Take 1 tablet (40 mg total) by mouth daily. 90 tablet 1   Wheat Dextrin (BENEFIBER) POWD Take 1 Dose by mouth daily as needed (constipation).     furosemide (LASIX) 40 MG tablet Take 1 tablet (40 mg total) by mouth daily. May take an extra 20 mg daily as needed for leg swelling or weight gain 135 tablet 1   No current facility-administered medications for this visit.   Facility-Administered Medications Ordered in Other Visits  Medication Dose Route Frequency Provider Last Rate Last Admin   bupivacaine liposome (EXPAREL) 1.3 % injection 266 mg  20 mL Infiltration Once [place], [employer], PA-C       Allergies:  Iodinated contrast media and Naproxen   ROS: No progressive palpitations.  No syncope or recent falls.  Using a rolling walker.  Physical Exam: VS:  BP 102/68   Pulse 64   Ht '5\' 3"'$  (1.6 m)   Wt 157 lb 11.2 oz (71.5 kg)   BMI 27.94 kg/m , BMI Body mass index is 27.94  kg/m.  Wt Readings from Last 3 Encounters:  01/17/22 157 lb 11.2 oz (71.5 kg)  12/08/21 154 lb (69.9 kg)  11/25/21 155 lb 11.2 oz (70.6 kg)    General: Patient appears comfortable at rest. HEENT: Conjunctiva and lids normal. Neck: Supple, no elevated JVP or carotid bruits, no thyromegaly. Lungs: Clear to auscultation, nonlabored breathing at rest. Cardiac: Regular rate and rhythm, no S3, 1/6 systolic murmur. Extremities: No pitting edema.  ECG:  An ECG dated 11/23/2021 was personally reviewed today and demonstrated:  Dual chamber pacing.  Recent Labwork: 09/26/2021: TSH 1.368 11/23/2021: B Natriuretic Peptide 630.0 11/25/2021: Magnesium 1.9 12/08/2021: ALT 13; AST 23; BUN 16; Creatinine, Ser 1.10; Hemoglobin 11.5; Platelets 159; Potassium 4.4; Sodium 135  Component Value Date/Time   CHOL 173 09/14/2021 1007   TRIG 42 09/14/2021 1007   HDL 55 09/14/2021 1007   CHOLHDL 3.1 09/14/2021 1007   LDLCALC 109 (H) 09/14/2021 1007    Other Studies Reviewed Today:  Echocardiogram 11/24/2021:  1. Limited study with limited images   2. Left ventricular ejection fraction, by estimation, is 60 to 65%. Left  ventricular ejection fraction by 2D MOD biplane is 63.6 %. The left  ventricle has normal function. The left ventricle has no regional wall  motion abnormalities. There is moderate  left ventricular hypertrophy.   3. The right ventricular size is mildly enlarged. Tricuspid regurgitation  signal is inadequate for assessing PA pressure.   4. Right atrial size was severely dilated.   5. The aortic valve is tricuspid. Aortic valve regurgitation is not  visualized. Aortic valve sclerosis/calcification is present, without any  evidence of aortic stenosis.   6. The inferior vena cava is dilated in size with <50% respiratory  variability, suggesting right atrial pressure of 15 mmHg.   Assessment and Plan:  1.  HFpEF with LVEF 60 to 65%.  Continue Lasix 40 mg in the morning, can take an  additional 20 mg in the afternoon with 2 pound weight gain in 24 hours or increasing leg edema.  2.  Paroxysmal atrial fibrillation with CHA2DS2-VASc score of 4.  Continue amiodarone for rhythm suppression and Eliquis for stroke prophylaxis.  I reviewed her lab work from within the last 6 months.  TSH and LFTs normal.  3.  Sick sinus syndrome with [facility] pacemaker in place and follow-up by EP.  Medication Adjustments/Labs and Tests Ordered: Current medicines are reviewed at length with the patient today.  Concerns regarding medicines are outlined above.   Tests Ordered: No orders of the defined types were placed in this encounter.   Medication Changes: Meds ordered this encounter  Medications   furosemide (LASIX) 40 MG tablet    Sig: Take 1 tablet (40 mg total) by mouth daily. May take an extra 20 mg daily as needed for leg swelling or weight gain    Dispense:  135 tablet    Refill:  1    Disposition:  Follow up  6 months.  Signed, Satira Sark, MD, [facility] 01/17/2022 4:06 PM    [facility] [place], [place], [place] [address] Phone: [phone]; Fax: [phone]

## 2022-01-25 ENCOUNTER — Other Ambulatory Visit: Payer: Self-pay | Admitting: Family Medicine

## 2022-01-26 ENCOUNTER — Ambulatory Visit (INDEPENDENT_AMBULATORY_CARE_PROVIDER_SITE_OTHER): Payer: Medicare Other

## 2022-01-26 VITALS — Wt 154.0 lb

## 2022-01-26 DIAGNOSIS — Z0000 Encounter for general adult medical examination without abnormal findings: Secondary | ICD-10-CM | POA: Diagnosis not present

## 2022-01-26 NOTE — Progress Notes (Signed)
Subjective:   [name] is a 88 y.o. female who presents for Medicare Annual (Subsequent) preventive examination.  Virtual Visit via Telephone Note  I connected with  [name] on 01/26/22 at 12:00 PM EDT by telephone and verified that I am speaking with the correct person using two identifiers.  Location: Patient: Home Provider: [facility] Persons participating in the virtual visit: patient/Nurse Health Advisor   I discussed the limitations, risks, security and privacy concerns of performing an evaluation and management service by telephone and the availability of in person appointments. The patient expressed understanding and agreed to proceed.  Interactive audio and video telecommunications were attempted between this nurse and patient, however failed, due to patient having technical difficulties OR patient did not have access to video capability.  We continued and completed visit with audio only.  Some vital signs may be absent or patient reported.   [name] Elliott E Willman Cuny, LPN   Review of Systems     Cardiac Risk Factors include: advanced age (>81mn, >>82women);sedentary lifestyle;dyslipidemia;hypertension;Other (see comment), Risk factor comments: A.Fib, CAD, CHF, hx DVT, anemia     Objective:    Today's Vitals   01/26/22 1154  Weight: 154 lb (69.9 kg)   Body mass index is 27.28 kg/m.     01/26/2022   12:00 PM 11/24/2021    1:00 AM 11/23/2021    7:29 PM 09/30/2021    3:09 PM 02/16/2021    4:45 PM 01/12/2021    9:55 AM 11/17/2020    4:00 PM  Advanced Directives  Does Patient Have a Medical Advance Directive? No No No No No No No  Does patient want to make changes to medical advance directive?      No - Patient declined   Would patient like information on creating a medical advance directive? No - Patient declined No - Patient declined No - Patient declined No - Patient declined  No - Patient declined No - Patient declined    Current Medications  (verified) Outpatient Encounter Medications as of 01/26/2022  Medication Sig   acetaminophen (TYLENOL) 500 MG tablet Take 1,000 mg by mouth every 6 (six) hours as needed for mild pain.   amiodarone (PACERONE) 200 MG tablet Take 1 tablet (200 mg total) by mouth daily.   apixaban (ELIQUIS) 5 MG TABS tablet Take 1 tablet (5 mg total) by mouth 2 (two) times daily.   calcium-vitamin D (OSCAL WITH D) 500-200 MG-UNIT tablet Take 1 tablet by mouth daily with breakfast.    cyanocobalamin (,VITAMIN B-12,) 1000 MCG/ML injection Inject 1 mL (1,000 mcg total) into the muscle every 30 (thirty) days.   furosemide (LASIX) 40 MG tablet Take 1 tablet (40 mg total) by mouth daily. May take an extra 20 mg daily as needed for leg swelling or weight gain   latanoprost (XALATAN) 0.005 % ophthalmic solution Place 1 drop into both eyes at bedtime.   Multiple Vitamin (MULTIVITAMIN) tablet Take 1 tablet by mouth every morning.   nadolol (CORGARD) 40 MG tablet TAKE 1 AND 1/2 TABLETS DAILY   pantoprazole (PROTONIX) 40 MG tablet Take 1 tablet (40 mg total) by mouth daily.   Wheat Dextrin (BENEFIBER) POWD Take 1 Dose by mouth daily as needed (constipation).   nitroGLYCERIN (NITROSTAT) 0.4 MG SL tablet DISSOLVE 1 TAB UNDER TOUNGE FOR CHEST PAIN. MAY REPEAT EVERY 5 MINUTES FOR 3 DOSES. IF NO RELIEF CALL 911 OR GO TO ER (Patient taking differently: Place 0.4 mg under the tongue every 5 (five)  minutes as needed for chest pain.)   [DISCONTINUED] hydrocortisone 1 % lotion Apply 1 application. topically 2 (two) times daily. (Patient not taking: Reported on 01/26/2022)   [DISCONTINUED] Lidocaine HCl 4 % CREA Apply 1 application. topically daily. (Patient not taking: Reported on 01/26/2022)   Facility-Administered Encounter Medications as of 01/26/2022  Medication   bupivacaine liposome (EXPAREL) 1.3 % injection 266 mg    Allergies (verified) Iodinated contrast media and Naproxen   History: Past Medical History:  Diagnosis Date    Arthritis    Atrial flutter ([place])    s/p RFA 2003   Breast cancer ([place])    1980's - lumpectomy only   Colon polyps    Diverticulosis    DVT, lower extremity ([place]) 1998 and 1999   Bilateral   Essential hypertension    Gallstones    GERD (gastroesophageal reflux disease)    Glaucoma    IBS (irritable bowel syndrome)    Mixed hyperlipidemia    Osteoporosis    Persistent atrial fibrillation ([place])    Pneumonia 2011   Sick sinus syndrome ([facility])    PPM ([facility])   Urinary tract infection    Varicose veins    Past Surgical History:  Procedure Laterality Date   BIOPSY  07/04/2021   Procedure: BIOPSY;  Surgeon: Thornton Park, MD;  Location: [facility];  Service: Gastroenterology;;   BREAST LUMPECTOMY  [phone]   Right breast    CARDIAC CATHETERIZATION  1999   Normal coronaries   Cataracts Bilateral    CHOLECYSTECTOMY     COLONOSCOPY W/ BIOPSIES AND POLYPECTOMY  03/2003, 05/2008, 02/20/2011   severe diverticulosis, tubulovillous adenoma polyp, internal and external hemorrhoids 2012: severe diverticulosis, 4 mm polyp, hemorrhoids   CYST REMOVAL TRUNK     under breast = on belly    DILATION AND CURETTAGE OF UTERUS     ESOPHAGOGASTRODUODENOSCOPY (EGD) WITH PROPOFOL N/A 07/04/2021   Procedure: ESOPHAGOGASTRODUODENOSCOPY (EGD) WITH PROPOFOL;  Surgeon: Thornton Park, MD;  Location: [facility];  Service: Gastroenterology;  Laterality: N/A;   EYE SURGERY     cataracts - bilateral    Fractured wrist Left 2013   repair   INSERT / REPLACE / REMOVE PACEMAKER     KNEE ARTHROSCOPY WITH MEDIAL MENISECTOMY Right 10/21/2014   Procedure: RIGHT KNEE ARTHROSCOPY WITH MEDIAL MENISECTOM,lateral menisectomy,synovectomy suprapatellar pouch;  Surgeon: Latanya Maudlin, MD;  Location: [facility];  Service: Orthopedics;  Laterality: Right;   PACEMAKER GENERATOR CHANGE  06/18/12   [facility] DR RF, Dr Rayann Heman   PACEMAKER INSERTION  2003   PERMANENT PACEMAKER GENERATOR CHANGE N/A 06/18/2012   Procedure: PERMANENT  PACEMAKER GENERATOR CHANGE;  Surgeon: Thompson Grayer, MD;  Location: [facility] CATH LAB;  Service: Cardiovascular;  Laterality: N/A;   PPM GENERATOR CHANGEOUT N/A 09/30/2021   Procedure: PPM GENERATOR CHANGEOUT;  Surgeon: Thompson Grayer, MD;  Location: [facility] CV LAB;  Service: Cardiovascular;  Laterality: N/A;   TOTAL KNEE ARTHROPLASTY  12/20/2011   Procedure: TOTAL KNEE ARTHROPLASTY;  Surgeon: Tobi Bastos, MD;  Location: [facility];  Service: Orthopedics;  Laterality: Left;   TOTAL KNEE ARTHROPLASTY Right 02/10/2015   Procedure: TOTAL RIGHT KNEE ARTHROPLASTY;  Surgeon: Latanya Maudlin, MD;  Location: [facility];  Service: Orthopedics;  Laterality: Right;   Family History  Problem Relation Age of Onset   Throat cancer Father    Stroke Mother    Alzheimer's disease Mother    Alzheimer's disease Brother    Alzheimer's disease Sister    Colon cancer Maternal Grandfather  Stomach cancer Maternal Grandmother    Heart disease Paternal Grandmother    Heart attack Paternal Grandmother    Prostate cancer Son    Colon cancer Sister    Colon cancer Brother    Heart disease Brother    Atrial fibrillation Brother    Diabetes Brother    Dementia Brother    Parkinson's disease Brother    Prostate cancer Son    Social History   Socioeconomic History   Marital status: Widowed    Spouse name: Not on file   Number of children: 5   Years of education: 10   Highest education level: 12th grade  Occupational History   Occupation: Retired    Comment: resturant / cafe   Tobacco Use   Smoking status: Never   Smokeless tobacco: Never  Vaping Use   Vaping Use: Never used  Substance and Sexual Activity   Alcohol use: No    Alcohol/week: 0.0 standard drinks of alcohol   Drug use: No   Sexual activity: Not on file  Other Topics Concern   Not on file  Social History Narrative   Widowed  - 5 sons = 1 lives local    Retired.   Never smoker no alcohol   Social Determinants of Health   Financial  Resource Strain: Low Risk  (01/26/2022)   Overall Financial Resource Strain (CARDIA)    Difficulty of Paying Living Expenses: Not hard at all  Food Insecurity: No Food Insecurity (01/26/2022)   Hunger Vital Sign    Worried About Running Out of Food in the Last Year: Never true    Ran Out of Food in the Last Year: Never true  Transportation Needs: No Transportation Needs (01/26/2022)   PRAPARE - Hydrologist (Medical): No    Lack of Transportation (Non-Medical): No  Physical Activity: Insufficiently Active (01/26/2022)   Exercise Vital Sign    Days of Exercise per Week: 7 days    Minutes of Exercise per Session: 20 min  Stress: No Stress Concern Present (01/26/2022)   [place]    Feeling of Stress : Not at all  Social Connections: Moderately Integrated (01/26/2022)   Social Connection and Isolation Panel [NHANES]    Frequency of Communication with Friends and Family: More than three times a week    Frequency of Social Gatherings with Friends and Family: More than three times a week    Attends Religious Services: More than 4 times per year    Active Member of [employer] or Organizations: Yes    Attends Archivist Meetings: More than 4 times per year    Marital Status: Widowed    Tobacco Counseling Counseling given: Not Answered   Clinical Intake:  Pre-visit preparation completed: Yes  Pain : No/denies pain     BMI - recorded: 27.28 Nutritional Status: BMI 25 -29 Overweight Nutritional Risks: None Diabetes: No  How often do you need to have someone help you when you read instructions, pamphlets, or other written materials from your doctor or pharmacy?: 1 - Never  Diabetic? no  Interpreter Needed?: No  Information entered by :: Seaton Hofmann, LPN   Activities of Daily Living    01/26/2022   12:00 PM 11/24/2021    1:00 AM  In your present state of health, do you have any  difficulty performing the following activities:  Hearing? 0 0  Vision? 0 0  Difficulty concentrating or making decisions?  0 0  Walking or climbing stairs? 1 0  Dressing or bathing? 0 0  Doing errands, shopping? 1 0  Preparing Food and eating ? N   Using the Toilet? N   In the past six months, have you accidently leaked urine? Y   Do you have problems with loss of bowel control? N   Managing your Medications? N   Managing your Finances? N   Housekeeping or managing your Housekeeping? N     Patient Care Team: Dettinger, Fransisca Kaufmann, MD as PCP - General (Family Medicine) Satira Sark, MD as PCP - Cardiology (Cardiology) Thompson Grayer, MD as PCP - Electrophysiology (Cardiology) Harlen Labs, MD as Referring Physician (Optometry) Steffanie Rainwater, DPM as Consulting Physician (Podiatry) Gatha Mayer, MD as Consulting Physician (Gastroenterology)  Indicate any recent Medical Services you may have received from other than [facility] providers in the past year (date may be approximate).     Assessment:   This is a routine wellness examination for [name].  Hearing/Vision screen Hearing Screening - Comments:: Denies hearing difficulties   Vision Screening - Comments:: Wears rx glasses - up to date with routine eye exams with [facility] in [place] issues and exercise activities discussed: Current Exercise Habits: Home exercise routine, Type of exercise: walking;stretching, Time (Minutes): 20, Frequency (Times/Week): 7, Weekly Exercise (Minutes/Week): 140, Intensity: Mild, Exercise limited by: orthopedic condition(s);cardiac condition(s)   Goals Addressed             This Visit's Progress    Prevent falls   On track    Stay active ( garden)  Lacinda Axon more / bake more      Prevent falls   Not on track    Always use walker, continue stretches, fall prevention exercises       Depression Screen    01/26/2022   11:59 AM 12/08/2021    8:59 AM 09/14/2021    9:19 AM  08/16/2021    4:07 PM 07/07/2021    8:19 AM 06/28/2021   10:44 AM 03/08/2021    3:42 PM  PHQ 2/9 Scores  PHQ - 2 Score '3 3 1 3 '$ 0 0 4  PHQ- 9 Score '6 6 6 9 1 1 14    '$ Fall Risk    01/26/2022   11:55 AM 12/08/2021    8:59 AM 09/14/2021    9:19 AM 08/16/2021    4:07 PM 07/07/2021    8:17 AM  Fall Risk   Falls in the past year? '1 1 1 1 '$ 0  Number falls in past yr: 1 0 0 0   Injury with Fall? '1 1 1 1   '$ Risk for fall due to : History of fall(s);Impaired balance/gait;Orthopedic patient Impaired balance/gait Impaired balance/gait;Impaired mobility Impaired mobility;Impaired balance/gait   Follow up Education provided;Falls prevention discussed Falls evaluation completed Falls evaluation completed      FALL RISK PREVENTION PERTAINING TO THE HOME:  Any stairs in or around the home? Yes  If so, are there any without handrails? No  Home free of loose throw rugs in walkways, pet beds, electrical cords, etc? Yes  Adequate lighting in your home to reduce risk of falls? Yes   ASSISTIVE DEVICES UTILIZED TO PREVENT FALLS:  Life alert? Yes  Use of a cane, walker or w/c? Yes  Grab bars in the bathroom? Yes  Shower chair or bench in shower? Yes  Elevated toilet seat or a handicapped toilet? Yes   TIMED UP AND GO:  Was the test performed? No . Telephonic visit  Cognitive Function:        01/26/2022   12:02 PM 01/12/2021    9:56 AM 01/12/2020   10:10 AM 12/24/2018    9:43 AM  6CIT Screen  What Year? 0 points 0 points 0 points 0 points  What month? 0 points 0 points 0 points 0 points  What time? 0 points 0 points 0 points 0 points  Count back from 20 0 points 0 points 0 points 0 points  Months in reverse 0 points 0 points 0 points 0 points  Repeat phrase 0 points 0 points 2 points 0 points  Total Score 0 points 0 points 2 points 0 points    Immunizations Immunization History  Administered Date(s) Administered   Fluad Quad(high Dose 65+) 03/18/2019, 03/08/2021   Influenza, High Dose Seasonal PF  04/02/2015, 04/25/2017, 03/13/2018   Influenza,trivalent, recombinat, inj, PF 03/13/2014   Moderna Sars-Covid-2 Vaccination 08/11/2019, 09/08/2019   Pneumococcal Conjugate-13 04/02/2015   Pneumococcal Polysaccharide-23 07/22/2007, 08/16/2016   Td 08/11/2019   Tdap 01/26/2010    TDAP status: Up to date  Flu Vaccine status: Up to date  Pneumococcal vaccine status: Up to date  Covid-19 vaccine status: Completed vaccines  Qualifies for Shingles Vaccine? Yes   Zostavax completed No   Shingrix Completed?: No.    Education has been provided regarding the importance of this vaccine. Patient has been advised to call insurance company to determine out of pocket expense if they have not yet received this vaccine. Advised may also receive vaccine at local pharmacy or Health Dept. Verbalized acceptance and understanding.  Screening Tests Health Maintenance  Topic Date Due   INFLUENZA VACCINE  01/10/2022   COVID-19 Vaccine (3 - Moderna risk series) 07/03/2022 (Originally 10/06/2019)   Zoster Vaccines- Shingrix (1 of 2) 07/03/2022 (Originally 05/17/1948)   MAMMOGRAM  10/28/2022   TETANUS/TDAP  08/10/2029   Pneumonia Vaccine 6+ Years old  Completed   DEXA SCAN  Completed   HPV VACCINES  Aged Out    Health Maintenance  Health Maintenance Due  Topic Date Due   INFLUENZA VACCINE  01/10/2022    Colorectal cancer screening: No longer required.   Mammogram status: Completed 10/27/2021. Repeat every year  Bone Density status: Completed 06/19/2019. Results reflect: Bone density results: OSTEOPENIA. Repeat every 2 years.  Lung Cancer Screening: (Low Dose CT Chest recommended if Age 81-80 years, 30 pack-year currently smoking OR have quit w/in 15years.) does not qualify.   Additional Screening:  Hepatitis C Screening: does not qualify  Vision Screening: Recommended annual ophthalmology exams for early detection of glaucoma and other disorders of the eye. Is the patient up to date with their  annual eye exam?  Yes  Who is the provider or what is the name of the office in which the patient attends annual eye exams? [facility] If pt is not established with a provider, would they like to be referred to a provider to establish care? No .   Dental Screening: Recommended annual dental exams for proper oral hygiene  Community Resource Referral / Chronic Care Management: CRR required this visit?  No   CCM required this visit?  No      Plan:     I have personally reviewed and noted the following in the patient's chart:   Medical and social history Use of alcohol, tobacco or illicit drugs  Current medications and supplements including opioid prescriptions.  Functional ability and status Nutritional  status Physical activity Advanced directives List of other physicians Hospitalizations, surgeries, and ER visits in previous 12 months Vitals Screenings to include cognitive, depression, and falls Referrals and appointments  In addition, I have reviewed and discussed with patient certain preventive protocols, quality metrics, and best practice recommendations. A written personalized care plan for preventive services as well as general preventive health recommendations were provided to patient.     [name] Hammond, LPN   [id number]   Nurse Notes: None

## 2022-01-26 NOTE — Patient Instructions (Signed)
[name] , Thank you for taking time to come for your Medicare Wellness Visit. I appreciate your ongoing commitment to your health goals. Please review the following plan we discussed and let me know if I can assist you in the future.   Screening recommendations/referrals: Colonoscopy: Done 07/04/2021 -  no repeat Mammogram: Done 10/27/2021 - Repeat annually if desired Bone Density: Done 06/19/2019 - no repeat required, but may be repeated every 2 years Recommended yearly ophthalmology/optometry visit for glaucoma screening and checkup Recommended yearly dental visit for hygiene and checkup  Vaccinations: Influenza vaccine: Done 03/08/2021 - Repeat annually  Pneumococcal vaccine: Done 04/02/2015 & 08/16/2016   Tdap vaccine: Done 08/11/2019 - Repeat in 10 years  Shingles vaccine: Declined - Shingrix is 2 doses 2-6 months apart and over 90% effective     Covid-19: Done 08/11/2019 & 09/08/2019  Advanced directives: Advance directive discussed with you today. Even though you declined this today, please call our office should you change your mind, and we can give you the proper paperwork for you to fill out.   Conditions/risks identified: Each day, aim for 6 glasses of water, plenty of protein in your diet and try to get up and walk/ stretch every hour for 5-10 minutes at a time.    Next appointment: Follow up in one year for your annual wellness visit    Preventive Care 65 Years and Older, Female Preventive care refers to lifestyle choices and visits with your health care provider that can promote health and wellness. What does preventive care include? A yearly physical exam. This is also called an annual well check. Dental exams once or twice a year. Routine eye exams. Ask your health care provider how often you should have your eyes checked. Personal lifestyle choices, including: Daily care of your teeth and gums. Regular physical activity. Eating a healthy diet. Avoiding tobacco and drug  use. Limiting alcohol use. Practicing safe sex. Taking low-dose aspirin every day. Taking vitamin and mineral supplements as recommended by your health care provider. What happens during an annual well check? The services and screenings done by your health care provider during your annual well check will depend on your age, overall health, lifestyle risk factors, and family history of disease. Counseling  Your health care provider may ask you questions about your: Alcohol use. Tobacco use. Drug use. Emotional well-being. Home and relationship well-being. Sexual activity. Eating habits. History of falls. Memory and ability to understand (cognition). Work and work Statistician. Reproductive health. Screening  You may have the following tests or measurements: Height, weight, and BMI. Blood pressure. Lipid and cholesterol levels. These may be checked every 5 years, or more frequently if you are over 33 years old. Skin check. Lung cancer screening. You may have this screening every year starting at age 75 if you have a 30-pack-year history of smoking and currently smoke or have quit within the past 15 years. Fecal occult blood test (FOBT) of the stool. You may have this test every year starting at age 37. Flexible sigmoidoscopy or colonoscopy. You may have a sigmoidoscopy every 5 years or a colonoscopy every 10 years starting at age 86. Hepatitis C blood test. Hepatitis B blood test. Sexually transmitted disease (STD) testing. Diabetes screening. This is done by checking your blood sugar (glucose) after you have not eaten for a while (fasting). You may have this done every 1-3 years. Bone density scan. This is done to screen for osteoporosis. You may have this done starting at age  86. Mammogram. This may be done every 1-2 years. Talk to your health care provider about how often you should have regular mammograms. Talk with your health care provider about your test results, treatment  options, and if necessary, the need for more tests. Vaccines  Your health care provider may recommend certain vaccines, such as: Influenza vaccine. This is recommended every year. Tetanus, diphtheria, and acellular pertussis (Tdap, Td) vaccine. You may need a Td booster every 10 years. Zoster vaccine. You may need this after age 63. Pneumococcal 13-valent conjugate (PCV13) vaccine. One dose is recommended after age 62. Pneumococcal polysaccharide (PPSV23) vaccine. One dose is recommended after age 80. Talk to your health care provider about which screenings and vaccines you need and how often you need them. This information is not intended to replace advice given to you by your health care provider. Make sure you discuss any questions you have with your health care provider. Document Released: 06/25/2015 Document Revised: 02/16/2016 Document Reviewed: 03/30/2015 Elsevier Interactive Patient Education  2017 [employer] Prevention in the Home Falls can cause injuries. They can happen to people of all ages. There are many things you can do to make your home safe and to help prevent falls. What can I do on the outside of my home? Regularly fix the edges of walkways and driveways and fix any cracks. Remove anything that might make you trip as you walk through a door, such as a raised step or threshold. Trim any bushes or trees on the path to your home. Use bright outdoor lighting. Clear any walking paths of anything that might make someone trip, such as rocks or tools. Regularly check to see if handrails are loose or broken. Make sure that both sides of any steps have handrails. Any raised decks and porches should have guardrails on the edges. Have any leaves, snow, or ice cleared regularly. Use sand or salt on walking paths during winter. Clean up any spills in your garage right away. This includes oil or grease spills. What can I do in the bathroom? Use night lights. Install grab  bars by the toilet and in the tub and shower. Do not use towel bars as grab bars. Use non-skid mats or decals in the tub or shower. If you need to sit down in the shower, use a plastic, non-slip stool. Keep the floor dry. Clean up any water that spills on the floor as soon as it happens. Remove soap buildup in the tub or shower regularly. Attach bath mats securely with double-sided non-slip rug tape. Do not have throw rugs and other things on the floor that can make you trip. What can I do in the bedroom? Use night lights. Make sure that you have a light by your bed that is easy to reach. Do not use any sheets or blankets that are too big for your bed. They should not hang down onto the floor. Have a firm chair that has side arms. You can use this for support while you get dressed. Do not have throw rugs and other things on the floor that can make you trip. What can I do in the kitchen? Clean up any spills right away. Avoid walking on wet floors. Keep items that you use a lot in easy-to-reach places. If you need to reach something above you, use a strong step stool that has a grab bar. Keep electrical cords out of the way. Do not use floor polish or wax that makes floors slippery.  If you must use wax, use non-skid floor wax. Do not have throw rugs and other things on the floor that can make you trip. What can I do with my stairs? Do not leave any items on the stairs. Make sure that there are handrails on both sides of the stairs and use them. Fix handrails that are broken or loose. Make sure that handrails are as long as the stairways. Check any carpeting to make sure that it is firmly attached to the stairs. Fix any carpet that is loose or worn. Avoid having throw rugs at the top or bottom of the stairs. If you do have throw rugs, attach them to the floor with carpet tape. Make sure that you have a light switch at the top of the stairs and the bottom of the stairs. If you do not have them,  ask someone to add them for you. What else can I do to help prevent falls? Wear shoes that: Do not have high heels. Have rubber bottoms. Are comfortable and fit you well. Are closed at the toe. Do not wear sandals. If you use a stepladder: Make sure that it is fully opened. Do not climb a closed stepladder. Make sure that both sides of the stepladder are locked into place. Ask someone to hold it for you, if possible. Clearly mark and make sure that you can see: Any grab bars or handrails. First and last steps. Where the edge of each step is. Use tools that help you move around (mobility aids) if they are needed. These include: Canes. Walkers. Scooters. Crutches. Turn on the lights when you go into a dark area. Replace any light bulbs as soon as they burn out. Set up your furniture so you have a clear path. Avoid moving your furniture around. If any of your floors are uneven, fix them. If there are any pets around you, be aware of where they are. Review your medicines with your doctor. Some medicines can make you feel dizzy. This can increase your chance of falling. Ask your doctor what other things that you can do to help prevent falls. This information is not intended to replace advice given to you by your health care provider. Make sure you discuss any questions you have with your health care provider. Document Released: 03/25/2009 Document Revised: 11/04/2015 Document Reviewed: 07/03/2014 Elsevier Interactive Patient Education  2017 [employer].

## 2022-02-01 DIAGNOSIS — I87391 Chronic venous hypertension (idiopathic) with other complications of right lower extremity: Secondary | ICD-10-CM | POA: Diagnosis not present

## 2022-02-01 DIAGNOSIS — I83891 Varicose veins of right lower extremities with other complications: Secondary | ICD-10-CM | POA: Diagnosis not present

## 2022-02-01 NOTE — Progress Notes (Signed)
Remote pacemaker transmission.   

## 2022-02-23 ENCOUNTER — Other Ambulatory Visit: Payer: Self-pay | Admitting: Family Medicine

## 2022-02-23 DIAGNOSIS — K219 Gastro-esophageal reflux disease without esophagitis: Secondary | ICD-10-CM

## 2022-02-23 DIAGNOSIS — I10 Essential (primary) hypertension: Secondary | ICD-10-CM

## 2022-02-23 DIAGNOSIS — I480 Paroxysmal atrial fibrillation: Secondary | ICD-10-CM

## 2022-02-25 ENCOUNTER — Other Ambulatory Visit: Payer: Self-pay

## 2022-02-25 ENCOUNTER — Emergency Department (HOSPITAL_COMMUNITY)
Admission: EM | Admit: 2022-02-25 | Discharge: 2022-02-25 | Disposition: A | Discharge status: Home / Self Care | Payer: Medicare Other | Attending: Emergency Medicine | Admitting: Emergency Medicine

## 2022-02-25 ENCOUNTER — Encounter (HOSPITAL_COMMUNITY): Payer: Self-pay | Admitting: *Deleted

## 2022-02-25 ENCOUNTER — Emergency Department (HOSPITAL_COMMUNITY): Payer: Medicare Other

## 2022-02-25 DIAGNOSIS — Z950 Presence of cardiac pacemaker: Secondary | ICD-10-CM | POA: Diagnosis not present

## 2022-02-25 DIAGNOSIS — I110 Hypertensive heart disease with heart failure: Secondary | ICD-10-CM | POA: Diagnosis not present

## 2022-02-25 DIAGNOSIS — R0600 Dyspnea, unspecified: Secondary | ICD-10-CM | POA: Insufficient documentation

## 2022-02-25 DIAGNOSIS — R0902 Hypoxemia: Secondary | ICD-10-CM | POA: Diagnosis not present

## 2022-02-25 DIAGNOSIS — Z20822 Contact with and (suspected) exposure to covid-19: Secondary | ICD-10-CM | POA: Diagnosis not present

## 2022-02-25 DIAGNOSIS — R5383 Other fatigue: Secondary | ICD-10-CM | POA: Insufficient documentation

## 2022-02-25 DIAGNOSIS — Z7901 Long term (current) use of anticoagulants: Secondary | ICD-10-CM | POA: Diagnosis not present

## 2022-02-25 DIAGNOSIS — Z853 Personal history of malignant neoplasm of breast: Secondary | ICD-10-CM | POA: Insufficient documentation

## 2022-02-25 DIAGNOSIS — R079 Chest pain, unspecified: Secondary | ICD-10-CM

## 2022-02-25 DIAGNOSIS — Z96653 Presence of artificial knee joint, bilateral: Secondary | ICD-10-CM | POA: Diagnosis not present

## 2022-02-25 DIAGNOSIS — I509 Heart failure, unspecified: Secondary | ICD-10-CM | POA: Diagnosis not present

## 2022-02-25 DIAGNOSIS — R0602 Shortness of breath: Secondary | ICD-10-CM | POA: Diagnosis not present

## 2022-02-25 DIAGNOSIS — Z743 Need for continuous supervision: Secondary | ICD-10-CM | POA: Diagnosis not present

## 2022-02-25 DIAGNOSIS — R0789 Other chest pain: Secondary | ICD-10-CM | POA: Diagnosis not present

## 2022-02-25 LAB — CBC WITH DIFFERENTIAL/PLATELET
Abs Immature Granulocytes: 0.01 10*3/uL (ref 0.00–0.07)
Basophils Absolute: 0 10*3/uL (ref 0.0–0.1)
Basophils Relative: 1 %
Eosinophils Absolute: 0.1 10*3/uL (ref 0.0–0.5)
Eosinophils Relative: 3 %
HCT: 31 % — ABNORMAL LOW (ref 36.0–46.0)
Hemoglobin: 10.1 g/dL — ABNORMAL LOW (ref 12.0–15.0)
Immature Granulocytes: 0 %
Lymphocytes Relative: 33 %
Lymphs Abs: 1.3 10*3/uL (ref 0.7–4.0)
MCH: 31.9 pg (ref 26.0–34.0)
MCHC: 32.6 g/dL (ref 30.0–36.0)
MCV: 97.8 fL (ref 80.0–100.0)
Monocytes Absolute: 0.6 10*3/uL (ref 0.1–1.0)
Monocytes Relative: 14 %
Neutro Abs: 2 10*3/uL (ref 1.7–7.7)
Neutrophils Relative %: 49 %
Platelets: 117 10*3/uL — ABNORMAL LOW (ref 150–400)
RBC: 3.17 MIL/uL — ABNORMAL LOW (ref 3.87–5.11)
RDW: 14.3 % (ref 11.5–15.5)
WBC: 4 10*3/uL (ref 4.0–10.5)
nRBC: 0 % (ref 0.0–0.2)

## 2022-02-25 LAB — COMPREHENSIVE METABOLIC PANEL
ALT: 20 U/L (ref 0–44)
AST: 25 U/L (ref 15–41)
Albumin: 4.1 g/dL (ref 3.5–5.0)
Alkaline Phosphatase: 61 U/L (ref 38–126)
Anion gap: 10 (ref 5–15)
BUN: 20 mg/dL (ref 8–23)
CO2: 27 mmol/L (ref 22–32)
Calcium: 9.4 mg/dL (ref 8.9–10.3)
Chloride: 100 mmol/L (ref 98–111)
Creatinine, Ser: 0.99 mg/dL (ref 0.44–1.00)
GFR, Estimated: 54 mL/min — ABNORMAL LOW (ref >60)
Glucose, Bld: 98 mg/dL (ref 70–99)
Potassium: 4 mmol/L (ref 3.5–5.1)
Sodium: 137 mmol/L (ref 135–145)
Total Bilirubin: 0.9 mg/dL (ref 0.3–1.2)
Total Protein: 7.2 g/dL (ref 6.5–8.1)

## 2022-02-25 LAB — RESP PANEL BY RT-PCR (FLU A&B, COVID) ARPGX2
Influenza A by PCR: NEGATIVE
Influenza B by PCR: NEGATIVE
SARS Coronavirus 2 by RT PCR: NEGATIVE

## 2022-02-25 LAB — BRAIN NATRIURETIC PEPTIDE: B Natriuretic Peptide: 369 pg/mL — ABNORMAL HIGH (ref 0.0–100.0)

## 2022-02-25 LAB — LIPASE, BLOOD: Lipase: 51 U/L (ref 11–51)

## 2022-02-25 LAB — TSH: TSH: 1.765 u[IU]/mL (ref 0.350–4.500)

## 2022-02-25 LAB — TROPONIN I (HIGH SENSITIVITY)
Troponin I (High Sensitivity): 10 ng/L (ref <18)
Troponin I (High Sensitivity): 10 ng/L (ref <18)

## 2022-02-25 MED ORDER — ALBUTEROL SULFATE HFA 108 (90 BASE) MCG/ACT IN AERS: 1.0000 | INHALATION_SPRAY | Freq: Four times a day (QID) | RESPIRATORY_TRACT | 0 refills | Status: DC | PRN

## 2022-02-25 MED ORDER — FUROSEMIDE 10 MG/ML IJ SOLN
40.0000 mg | Freq: Once | INTRAMUSCULAR | Status: AC
  Administered 2022-02-25: 40 mg via INTRAVENOUS
  Filled 2022-02-25: qty 4

## 2022-02-25 MED ORDER — IPRATROPIUM-ALBUTEROL 0.5-2.5 (3) MG/3ML IN SOLN
3.0000 mL | Freq: Once | RESPIRATORY_TRACT | Status: AC
  Administered 2022-02-25: 3 mL via RESPIRATORY_TRACT
  Filled 2022-02-25: qty 3

## 2022-02-25 NOTE — ED Triage Notes (Signed)
BIB [employer]. EMS from home for chest tightness, son lives next door and called 911. Onset upon waking.

## 2022-02-25 NOTE — ED Provider Notes (Signed)
[facility] EMERGENCY DEPARTMENT Provider Note   CSN: [id number] Arrival date & time: 02/25/22  1224     History {Add pertinent medical, surgical, social history, OB history to HPI:1} Chief Complaint  Patient presents with   Chest Pain    [name] is a 88 y.o. female.  Patient as above with significant medical history as below, including LE DVT, aflutter, htn, IBS, HLD, sick sinus who presents to the ED with complaint of dib, chest tightness, fatigue.  Symptoms ongoing past 2 weeks, worse in the last 24 hours.  Dyspnea described as exertional, associate with orthopnea, mild chest tightness.  Generalized fatigue.  Denies fevers or chills.  Intermittent cough productive with white/clear sputum.  Denies recent travel or sick contacts.  She is compliant with her home medications, compliant with DOAC for A-fib.  No falls in the past few days.  No head injuries.  No abdominal pain, nausea or vomiting.  She is tolerant p.o. intake without much difficulty.  Oxygen use.  No home CPAP or BiPAP use.     Past Medical History:  Diagnosis Date   Arthritis    Atrial flutter ([place])    s/p RFA 2003   Breast cancer ([place])    1980's - lumpectomy only   Colon polyps    Diverticulosis    DVT, lower extremity ([place]) 1998 and 1999   Bilateral   Essential hypertension    Gallstones    GERD (gastroesophageal reflux disease)    Glaucoma    IBS (irritable bowel syndrome)    Mixed hyperlipidemia    Osteoporosis    Persistent atrial fibrillation ([place])    Pneumonia 2011   Sick sinus syndrome ([facility])    PPM ([facility])   Urinary tract infection    Varicose veins     Past Surgical History:  Procedure Laterality Date   BIOPSY  07/04/2021   Procedure: BIOPSY;  Surgeon: Thornton Park, MD;  Location: [place];  Service: Gastroenterology;;   BREAST LUMPECTOMY  [phone]   Right breast    CARDIAC CATHETERIZATION  1999   Normal coronaries   Cataracts Bilateral    CHOLECYSTECTOMY     COLONOSCOPY  W/ BIOPSIES AND POLYPECTOMY  03/2003, 05/2008, 02/20/2011   severe diverticulosis, tubulovillous adenoma polyp, internal and external hemorrhoids 2012: severe diverticulosis, 4 mm polyp, hemorrhoids   CYST REMOVAL TRUNK     under breast = on belly    DILATION AND CURETTAGE OF UTERUS     ESOPHAGOGASTRODUODENOSCOPY (EGD) WITH PROPOFOL N/A 07/04/2021   Procedure: ESOPHAGOGASTRODUODENOSCOPY (EGD) WITH PROPOFOL;  Surgeon: Thornton Park, MD;  Location: [place];  Service: Gastroenterology;  Laterality: N/A;   EYE SURGERY     cataracts - bilateral    Fractured wrist Left 2013   repair   INSERT / REPLACE / REMOVE PACEMAKER     KNEE ARTHROSCOPY WITH MEDIAL MENISECTOMY Right 10/21/2014   Procedure: RIGHT KNEE ARTHROSCOPY WITH MEDIAL MENISECTOM,lateral menisectomy,synovectomy suprapatellar pouch;  Surgeon: Latanya Maudlin, MD;  Location: [facility];  Service: Orthopedics;  Laterality: Right;   PACEMAKER GENERATOR CHANGE  06/18/12   [facility] DR RF, Dr Rayann Heman   PACEMAKER INSERTION  2003   PERMANENT PACEMAKER GENERATOR CHANGE N/A 06/18/2012   Procedure: PERMANENT PACEMAKER GENERATOR CHANGE;  Surgeon: Thompson Grayer, MD;  [facility];  Service: Cardiovascular;  Laterality: N/A;   PPM GENERATOR CHANGEOUT N/A 09/30/2021   Procedure: PPM GENERATOR CHANGEOUT;  Surgeon: Thompson Grayer, MD;  Location: [place] [facility];  Service: Cardiovascular;  Laterality: N/A;   TOTAL KNEE ARTHROPLASTY  12/20/2011   Procedure: TOTAL KNEE ARTHROPLASTY;  Surgeon: Tobi Bastos, MD;  Location: [facility];  Service: Orthopedics;  Laterality: Left;   TOTAL KNEE ARTHROPLASTY Right 02/10/2015   Procedure: TOTAL RIGHT KNEE ARTHROPLASTY;  Surgeon: Latanya Maudlin, MD;  Location: [facility];  Service: Orthopedics;  Laterality: Right;     The history is provided by the patient. No language interpreter was used.  Chest Pain Associated symptoms: fatigue and shortness of breath   Associated symptoms: no abdominal pain, no back pain, no  cough, no dysphagia, no fever, no headache, no nausea and no palpitations        Home Medications Prior to Admission medications   Medication Sig Start Date End Date Taking? Authorizing Provider  acetaminophen (TYLENOL) 500 MG tablet Take 1,000 mg by mouth every 6 (six) hours as needed for mild pain.    [provider]  amiodarone (PACERONE) 200 MG tablet TAKE ONE (1) TABLET EACH DAY 02/24/22   Dettinger, Fransisca Kaufmann, MD  apixaban (ELIQUIS) 5 MG TABS tablet Take 1 tablet (5 mg total) by mouth 2 (two) times daily. 12/08/21   Dettinger, Fransisca Kaufmann, MD  calcium-vitamin D (OSCAL WITH D) 500-200 MG-UNIT tablet Take 1 tablet by mouth daily with breakfast.     [provider]  cyanocobalamin (,VITAMIN B-12,) 1000 MCG/ML injection Inject 1 mL (1,000 mcg total) into the muscle every 30 (thirty) days. 05/31/20   Dettinger, Fransisca Kaufmann, MD  furosemide (LASIX) 40 MG tablet Take 1 tablet (40 mg total) by mouth daily. May take an extra 20 mg daily as needed for leg swelling or weight gain 01/17/22   Satira Sark, MD  latanoprost (XALATAN) 0.005 % ophthalmic solution Place 1 drop into both eyes at bedtime.    [provider]  Multiple Vitamin (MULTIVITAMIN) tablet Take 1 tablet by mouth every morning.    [provider]  nadolol (CORGARD) 40 MG tablet TAKE 1 AND 1/2 TABLETS DAILY 01/25/22   Dettinger, Fransisca Kaufmann, MD  nitroGLYCERIN (NITROSTAT) 0.4 MG SL tablet DISSOLVE 1 TAB UNDER TOUNGE FOR CHEST PAIN. MAY REPEAT EVERY 5 MINUTES FOR 3 DOSES. IF NO RELIEF CALL 911 OR GO TO ER Patient taking differently: Place 0.4 mg under the tongue every 5 (five) minutes as needed for chest pain. 09/10/20   Satira Sark, MD  pantoprazole (PROTONIX) 40 MG tablet TAKE ONE (1) TABLET BY MOUTH EVERY DAY 02/24/22   Dettinger, Fransisca Kaufmann, MD  Wheat Dextrin (BENEFIBER) POWD Take 1 Dose by mouth daily as needed (constipation).    [provider]      Allergies    Iodinated contrast media and  Naproxen    Review of Systems   Review of Systems  Constitutional:  Positive for fatigue. Negative for activity change and fever.  HENT:  Negative for facial swelling and trouble swallowing.   Eyes:  Negative for discharge and redness.  Respiratory:  Positive for chest tightness and shortness of breath. Negative for cough.   Cardiovascular:  Positive for chest pain. Negative for palpitations.  Gastrointestinal:  Negative for abdominal pain and nausea.  Genitourinary:  Negative for dysuria and flank pain.  Musculoskeletal:  Negative for back pain and gait problem.  Skin:  Negative for pallor and rash.  Neurological:  Negative for syncope and headaches.    Physical Exam Updated Vital Signs BP 127/79   Pulse 63   Resp 14   Ht '5\' 3"'$  (1.6 m)  Wt 69.9 kg   SpO2 92%   BMI 27.28 kg/m  Physical Exam Vitals and nursing note reviewed.  Constitutional:      General: She is not in acute distress.    Appearance: Normal appearance. She is well-developed. She is not ill-appearing.  HENT:     Head: Normocephalic and atraumatic.     Right Ear: External ear normal.     Left Ear: External ear normal.     Nose: Nose normal.     Mouth/Throat:     Mouth: Mucous membranes are moist.  Eyes:     General: No scleral icterus.       Right eye: No discharge.        Left eye: No discharge.  Cardiovascular:     Rate and Rhythm: Normal rate and regular rhythm.     Pulses: Normal pulses.     Heart sounds: Normal heart sounds.  Pulmonary:     Effort: Pulmonary effort is normal. No accessory muscle usage or respiratory distress.     Breath sounds: Decreased air movement present. Decreased breath sounds present.  Abdominal:     General: Abdomen is flat.     Tenderness: There is no abdominal tenderness.  Musculoskeletal:        General: Normal range of motion.     Cervical back: Normal range of motion.     Right lower leg: No edema.     Left lower leg: No edema.  Skin:    General: Skin is warm  and dry.     Capillary Refill: Capillary refill takes less than 2 seconds.  Neurological:     Mental Status: She is alert and oriented to person, place, and time.     GCS: GCS eye subscore is 4. GCS verbal subscore is 5. GCS motor subscore is 6.  Psychiatric:        Mood and Affect: Mood normal.        Behavior: Behavior normal.     ED Results / Procedures / Treatments   Labs (all labs ordered are listed, but only abnormal results are displayed) Labs Reviewed  CBC WITH DIFFERENTIAL/PLATELET - Abnormal; Notable for the following components:      Result Value   RBC 3.17 (*)    Hemoglobin 10.1 (*)    HCT 31.0 (*)    Platelets 117 (*)    All other components within normal limits  RESP PANEL BY RT-PCR (FLU A&B, COVID) ARPGX2  COMPREHENSIVE METABOLIC PANEL  LIPASE, BLOOD  BRAIN NATRIURETIC PEPTIDE  TSH  TROPONIN I (HIGH SENSITIVITY)    EKG EKG Interpretation  Date/Time:  Saturday February 25 2022 12:49:51 EDT Ventricular Rate:  62 PR Interval:    QRS Duration: 194 QT Interval:  518 QTC Calculation: 527 R Axis:   -82 Text Interpretation: Junctional rhythm Nonspecific IVCD with LAD LVH with secondary repolarization abnormality similar to prior no stemi Confirmed by Wynona Dove ([id number]) on 02/25/2022 12:58:23 PM  Radiology No results found.  Procedures Procedures  {Document cardiac monitor, telemetry assessment procedure when appropriate:1}  Medications Ordered in ED Medications - No data to display  ED Course/ Medical Decision Making/ A&P                           Medical Decision Making Amount and/or Complexity of Data Reviewed Labs: ordered. Radiology: ordered.   This patient presents to the ED with chief complaint(s) of dib, chest tightness, fatigue with pertinent past  medical history of above which further complicates the presenting complaint. The complaint involves an extensive differential diagnosis and also carries with it a high risk of complications and  morbidity.    In my evaluation of this patient's dyspnea my DDx includes, but is not limited to, pneumonia, pulmonary embolism, pneumothorax, pulmonary edema, metabolic acidosis, asthma, COPD, cardiac cause, anemia, anxiety, etc.   Differential includes all life-threatening causes for chest pain. This includes but is not exclusive to acute coronary syndrome, aortic dissection, pulmonary embolism, cardiac tamponade, community-acquired pneumonia, pericarditis, musculoskeletal chest wall pain, etc.  Serious etiologies were considered.   The initial plan is to screening labs/imaging    Additional history obtained: Additional history obtained from  na Records reviewed previous admission documents and prior ED visits, prior labs and imaging no medications  Independent labs interpretation:  The following labs were independently interpreted: ***  Independent visualization of imaging: - I independently visualized the following imaging with scope of interpretation limited to determining acute life threatening conditions related to emergency care: X-ray, which revealed ***  Cardiac monitoring was reviewed and interpreted by myself which shows junctional rhythm   Treatment and Reassessment: ***  Consultation: - Consulted or discussed management/test interpretation w/ external professional: ***  Consideration for admission or further workup: Admission was considered ***  Social Determinants of health: Social History   Tobacco Use   Smoking status: Never   Smokeless tobacco: Never  Vaping Use   Vaping Use: Never used  Substance Use Topics   Alcohol use: No    Alcohol/week: 0.0 standard drinks of alcohol   Drug use: No      {Document critical care time when appropriate:1} {Document review of labs and clinical decision tools ie heart score, Chads2Vasc2 etc:1}  {Document your independent review of radiology images, and any outside records:1} {Document your discussion with family  members, caretakers, and with consultants:1} {Document social determinants of health affecting pt's care:1} {Document your decision making why or why not admission, treatments were needed:1} Final Clinical Impression(s) / ED Diagnoses Final diagnoses:  None    Rx / DC Orders ED Discharge Orders     None

## 2022-02-25 NOTE — Discharge Instructions (Addendum)
Please follow-up with your cardiologist Dr. Domenic Polite  Continue taking your lasix as prescribed, please  add extra 20 mg table daily for the next 3 days  It was a pleasure caring for you today in the emergency department.  Please return to the emergency department for any worsening or worrisome symptoms.

## 2022-02-28 DIAGNOSIS — I87391 Chronic venous hypertension (idiopathic) with other complications of right lower extremity: Secondary | ICD-10-CM | POA: Diagnosis not present

## 2022-02-28 DIAGNOSIS — I83891 Varicose veins of right lower extremities with other complications: Secondary | ICD-10-CM | POA: Diagnosis not present

## 2022-03-01 ENCOUNTER — Encounter: Payer: Self-pay | Admitting: Family Medicine

## 2022-03-01 ENCOUNTER — Ambulatory Visit (INDEPENDENT_AMBULATORY_CARE_PROVIDER_SITE_OTHER): Payer: Medicare Other | Admitting: Family Medicine

## 2022-03-01 VITALS — BP 107/50 | HR 60 | Temp 98.5°F | Ht 63.0 in | Wt 155.0 lb

## 2022-03-01 DIAGNOSIS — R5383 Other fatigue: Secondary | ICD-10-CM

## 2022-03-01 DIAGNOSIS — I5033 Acute on chronic diastolic (congestive) heart failure: Secondary | ICD-10-CM

## 2022-03-01 DIAGNOSIS — R5381 Other malaise: Secondary | ICD-10-CM | POA: Diagnosis not present

## 2022-03-01 DIAGNOSIS — R0602 Shortness of breath: Secondary | ICD-10-CM | POA: Diagnosis not present

## 2022-03-01 NOTE — Progress Notes (Signed)
Subjective:  Patient ID: [name], female    DOB: 1929-02-09, 88 y.o.   MRN: [id number]  Patient Care Team: Dettinger, Fransisca Kaufmann, MD as PCP - General (Family Medicine) Satira Sark, MD as PCP - Cardiology (Cardiology) Thompson Grayer, MD as PCP - Electrophysiology (Cardiology) Harlen Labs, MD as Referring Physician (Optometry) Steffanie Rainwater, DPM as Consulting Physician (Podiatry) Gatha Mayer, MD as Consulting Physician (Gastroenterology)   Chief Complaint:  Follow-up (ED follow up)   HPI: [name] is a 88 y.o. female presenting on 03/01/2022 for Follow-up (ED follow up)   Pt presents today for follow up after recent ED visit for acute on chronic CHF. Medical history including LLE DVT, A-Flutter, HTN, IBS, HLD, sick sinus syndrome s/p PPM. She presented to ED 02/26/2022 with complaints of shortness of breath, chest pain  and fatigue for 2 weeks. She was given lasix during visit with great improvement in symptoms. Labs revealed Hgb 10.1, Ht 31, platelets 117, BNP 369, eGFR 54. She was discharged home with instructions to increase lasix from 40 mg to 60 mg for next few days. She reports she has been doing well since home. Still has some slight exertional shortness of breath. Has lower extremity swelling in the evenings. Denies orthopnea or PND. Does continue to have malaise and fatigue.     Relevant past medical, surgical, family, and social history reviewed and updated as indicated.  Allergies and medications reviewed and updated. Data reviewed: Chart in Epic.   Past Medical History:  Diagnosis Date   Arthritis    Atrial flutter ([place])    s/p RFA 2003   Breast cancer ([place])    1980's - lumpectomy only   Colon polyps    Diverticulosis    DVT, lower extremity ([place]) 1998 and 1999   Bilateral   Essential hypertension    Gallstones    GERD (gastroesophageal reflux disease)    Glaucoma    IBS (irritable bowel syndrome)    Mixed hyperlipidemia     Osteoporosis    Persistent atrial fibrillation ([place])    Pneumonia 2011   Sick sinus syndrome (HCC)    PPM ([facility])   Urinary tract infection    Varicose veins     Past Surgical History:  Procedure Laterality Date   BIOPSY  07/04/2021   Procedure: BIOPSY;  Surgeon: Thornton Park, MD;  Location: [facility];  Service: Gastroenterology;;   BREAST LUMPECTOMY  [phone]   Right breast    CARDIAC CATHETERIZATION  1999   Normal coronaries   Cataracts Bilateral    CHOLECYSTECTOMY     COLONOSCOPY W/ BIOPSIES AND POLYPECTOMY  03/2003, 05/2008, 02/20/2011   severe diverticulosis, tubulovillous adenoma polyp, internal and external hemorrhoids 2012: severe diverticulosis, 4 mm polyp, hemorrhoids   CYST REMOVAL TRUNK     under breast = on belly    DILATION AND CURETTAGE OF UTERUS     ESOPHAGOGASTRODUODENOSCOPY (EGD) WITH PROPOFOL N/A 07/04/2021   Procedure: ESOPHAGOGASTRODUODENOSCOPY (EGD) WITH PROPOFOL;  Surgeon: Thornton Park, MD;  Location: [facility];  Service: Gastroenterology;  Laterality: N/A;   EYE SURGERY     cataracts - bilateral    Fractured wrist Left 2013   repair   INSERT / REPLACE / REMOVE PACEMAKER     KNEE ARTHROSCOPY WITH MEDIAL MENISECTOMY Right 10/21/2014   Procedure: RIGHT KNEE ARTHROSCOPY WITH MEDIAL MENISECTOM,lateral menisectomy,synovectomy suprapatellar pouch;  Surgeon: Latanya Maudlin, MD;  Location: [facility];  Service: Orthopedics;  Laterality: Right;  PACEMAKER GENERATOR CHANGE  06/18/12   [facility] DR RF, Dr Rayann Heman   PACEMAKER INSERTION  2003   PERMANENT PACEMAKER GENERATOR CHANGE N/A 06/18/2012   Procedure: PERMANENT PACEMAKER GENERATOR CHANGE;  Surgeon: Thompson Grayer, MD;  Location: [facility] C[facility];  Service: Cardiovascular;  Laterality: N/A;   PPM GENERATOR CHANGEOUT N/A 09/30/2021   Procedure: PPM GENERATOR CHANGEOUT;  Surgeon: Thompson Grayer, MD;  Location: [facility];  Service: Cardiovascular;  Laterality: N/A;   TOTAL KNEE ARTHROPLASTY  12/20/2011    Procedure: TOTAL KNEE ARTHROPLASTY;  Surgeon: Tobi Bastos, MD;  Location: [facility];  Service: Orthopedics;  Laterality: Left;   TOTAL KNEE ARTHROPLASTY Right 02/10/2015   Procedure: TOTAL RIGHT KNEE ARTHROPLASTY;  Surgeon: Latanya Maudlin, MD;  Location: [facility];  Service: Orthopedics;  Laterality: Right;    Social History   Socioeconomic History   Marital status: Widowed    Spouse name: Not on file   Number of children: 5   Years of education: 3   Highest education level: 12th grade  Occupational History   Occupation: Retired    Comment: resturant / cafe   Tobacco Use   Smoking status: Never   Smokeless tobacco: Never  Vaping Use   Vaping Use: Never used  Substance and Sexual Activity   Alcohol use: No    Alcohol/week: 0.0 standard drinks of alcohol   Drug use: No   Sexual activity: Not on file  Other Topics Concern   Not on file  Social History Narrative   Widowed  - 5 sons = 1 lives local    Retired.   Never smoker no alcohol   Social Determinants of Health   Financial Resource Strain: Low Risk  (01/26/2022)   Overall Financial Resource Strain (CARDIA)    Difficulty of Paying Living Expenses: Not hard at all  Food Insecurity: No Food Insecurity (01/26/2022)   Hunger Vital Sign    Worried About Running Out of Food in the Last Year: Never true    Ran Out of Food in the Last Year: Never true  Transportation Needs: No Transportation Needs (01/26/2022)   PRAPARE - Hydrologist (Medical): No    Lack of Transportation (Non-Medical): No  Physical Activity: Insufficiently Active (01/26/2022)   Exercise Vital Sign    Days of Exercise per Week: 7 days    Minutes of Exercise per Session: 20 min  Stress: No Stress Concern Present (01/26/2022)   [place]    Feeling of Stress : Not at all  Social Connections: Moderately Integrated (01/26/2022)   Social Connection and Isolation Panel  [NHANES]    Frequency of Communication with Friends and Family: More than three times a week    Frequency of Social Gatherings with Friends and Family: More than three times a week    Attends Religious Services: More than 4 times per year    Active Member of [employer] or Organizations: Yes    Attends Archivist Meetings: More than 4 times per year    Marital Status: Widowed  Intimate Partner Violence: Not At Risk (01/26/2022)   Humiliation, Afraid, Rape, and Kick questionnaire    Fear of Current or Ex-Partner: No    Emotionally Abused: No    Physically Abused: No    Sexually Abused: No    Outpatient Encounter Medications as of 03/01/2022  Medication Sig   acetaminophen (TYLENOL) 500 MG tablet Take 1,000 mg by mouth  every 6 (six) hours as needed for mild pain.   albuterol (VENTOLIN HFA) 108 (90 Base) MCG/ACT inhaler Inhale 1-2 puffs into the lungs every 6 (six) hours as needed for wheezing or shortness of breath.   amiodarone (PACERONE) 200 MG tablet TAKE ONE (1) TABLET EACH DAY (Patient taking differently: Take 200 mg by mouth daily.)   apixaban (ELIQUIS) 5 MG TABS tablet Take 1 tablet (5 mg total) by mouth 2 (two) times daily.   calcium-vitamin D (OSCAL WITH D) 500-200 MG-UNIT tablet Take 1 tablet by mouth daily with breakfast.    cyanocobalamin (,VITAMIN B-12,) 1000 MCG/ML injection Inject 1 mL (1,000 mcg total) into the muscle every 30 (thirty) days.   furosemide (LASIX) 40 MG tablet Take 1 tablet (40 mg total) by mouth daily. May take an extra 20 mg daily as needed for leg swelling or weight gain (Patient taking differently: Take 20-40 mg by mouth See admin instructions. Take 1 tablet by mouth once daily. May take an extra 20 mg daily as needed for leg swelling or weight gain)   latanoprost (XALATAN) 0.005 % ophthalmic solution Place 1 drop into both eyes at bedtime.   Multiple Vitamin (MULTIVITAMIN) tablet Take 1 tablet by mouth every morning.   nadolol (CORGARD) 40 MG tablet TAKE  1 AND 1/2 TABLETS DAILY   nitroGLYCERIN (NITROSTAT) 0.4 MG SL tablet DISSOLVE 1 TAB UNDER TOUNGE FOR CHEST PAIN. MAY REPEAT EVERY 5 MINUTES FOR 3 DOSES. IF NO RELIEF CALL 911 OR GO TO ER (Patient taking differently: Place 0.4 mg under the tongue every 5 (five) minutes as needed for chest pain.)   pantoprazole (PROTONIX) 40 MG tablet TAKE ONE (1) TABLET BY MOUTH EVERY DAY (Patient taking differently: Take 40 mg by mouth daily.)   Wheat Dextrin (BENEFIBER) POWD Take 1 Dose by mouth daily as needed (constipation).   Facility-Administered Encounter Medications as of 03/01/2022  Medication   bupivacaine liposome (EXPAREL) 1.3 % injection 266 mg    Allergies  Allergen Reactions   Iodinated Contrast Media Hives, Itching and Rash    03/13/14: symptoms began within two hours of intrathecal injection (myelogram 03/11/14); on shoulders radiating down to stomach.  Relief with Benadryl.  Told patient she would need to pre-med with Benadryl in the future before receiving this contrast.  Brita Romp, RN   Naproxen Hives, Itching and Rash    Review of Systems  Constitutional:  Positive for activity change and fatigue. Negative for appetite change, chills, diaphoresis, fever and unexpected weight change.  Respiratory:  Positive for shortness of breath. Negative for apnea, cough, choking, chest tightness, wheezing and stridor.   Cardiovascular:  Positive for leg swelling (minimal to none). Negative for chest pain and palpitations.  Gastrointestinal:  Negative for abdominal distention and abdominal pain.  Genitourinary:  Negative for decreased urine volume and difficulty urinating.  Neurological:  Negative for dizziness, weakness, light-headedness and headaches.  Psychiatric/Behavioral:  Negative for confusion.   All other systems reviewed and are negative.       Objective:  BP (!) 107/50   Pulse 60   Temp 98.5 F (36.9 C)   Ht _0  (1.6 m)   Wt 155 lb (70.3 kg)   SpO2 95%   BMI 27.46 kg/m    Wt  Readings from Last 3 Encounters:  03/01/22 155 lb (70.3 kg)  02/25/22 154 lb (69.9 kg)  01/26/22 154 lb (69.9 kg)    Physical Exam Vitals and nursing note reviewed.  Constitutional:  General: She is not in acute distress.    Appearance: She is not ill-appearing, toxic-appearing or diaphoretic.     Comments: elderly  HENT:     Head: Normocephalic and atraumatic.     Nose: Nose normal.     Mouth/Throat:     Mouth: Mucous membranes are moist.     Pharynx: Oropharynx is clear.  Eyes:     Pupils: Pupils are equal, round, and reactive to light.  Neck:     Vascular: Normal carotid pulses. No carotid bruit, hepatojugular reflux or JVD.  Cardiovascular:     Rate and Rhythm: Normal rate and regular rhythm.     Heart sounds: Murmur heard.     Systolic murmur is present with a grade of 1/6.     No friction rub. No gallop.     Comments: PPM pocket chest wall intact Abdominal:     General: Abdomen is flat. Bowel sounds are normal. There is no distension.     Palpations: Abdomen is soft.     Tenderness: There is no abdominal tenderness.  Musculoskeletal:     Cervical back: Normal range of motion and neck supple.     Right lower leg: No edema.     Left lower leg: No edema.     Comments: Kyphosis of upper back  Skin:    General: Skin is warm and dry.     Capillary Refill: Capillary refill takes less than 2 seconds.  Neurological:     General: No focal deficit present.     Mental Status: She is alert and oriented to person, place, and time.     Gait: Gait abnormal (slow, using walker).  Psychiatric:        Mood and Affect: Mood normal.        Behavior: Behavior normal.        Thought Content: Thought content normal.        Judgment: Judgment normal.     Results for orders placed or performed during the hospital encounter of 02/25/22  Resp Panel by RT-PCR (Flu A&B, Covid) Anterior Nasal Swab   Specimen: Anterior Nasal Swab  Result Value Ref Range   SARS Coronavirus 2 by RT  PCR NEGATIVE NEGATIVE   Influenza A by PCR NEGATIVE NEGATIVE   Influenza B by PCR NEGATIVE NEGATIVE  Comprehensive metabolic panel  Result Value Ref Range   Sodium 137 135 - 145 mmol/L   Potassium 4.0 3.5 - 5.1 mmol/L   Chloride 100 98 - 111 mmol/L   CO2 27 22 - 32 mmol/L   Glucose, Bld 98 70 - 99 mg/dL   BUN 20 8 - 23 mg/dL   Creatinine, Ser 0.99 0.44 - 1.00 mg/dL   Calcium 9.4 8.9 - 10.3 mg/dL   Total Protein 7.2 6.5 - 8.1 g/dL   Albumin 4.1 3.5 - 5.0 g/dL   AST 25 15 - 41 U/L   ALT 20 0 - 44 U/L   Alkaline Phosphatase 61 38 - 126 U/L   Total Bilirubin 0.9 0.3 - 1.2 mg/dL   GFR, Estimated 54 (L) >60 mL/min   Anion gap 10 5 - 15  CBC with Differential  Result Value Ref Range   WBC 4.0 4.0 - 10.5 K/uL   RBC 3.17 (L) 3.87 - 5.11 MIL/uL   Hemoglobin 10.1 (L) 12.0 - 15.0 g/dL   HCT 31.0 (L) 36.0 - 46.0 %   MCV 97.8 80.0 - 100.0 fL   MCH 31.9 26.0 - 34.0 pg  MCHC 32.6 30.0 - 36.0 g/dL   RDW 14.3 11.5 - 15.5 %   Platelets 117 (L) 150 - 400 K/uL   nRBC 0.0 0.0 - 0.2 %   Neutrophils Relative % 49 %   Neutro Abs 2.0 1.7 - 7.7 K/uL   Lymphocytes Relative 33 %   Lymphs Abs 1.3 0.7 - 4.0 K/uL   Monocytes Relative 14 %   Monocytes Absolute 0.6 0.1 - 1.0 K/uL   Eosinophils Relative 3 %   Eosinophils Absolute 0.1 0.0 - 0.5 K/uL   Basophils Relative 1 %   Basophils Absolute 0.0 0.0 - 0.1 K/uL   Immature Granulocytes 0 %   Abs Immature Granulocytes 0.01 0.00 - 0.07 K/uL  Lipase, blood  Result Value Ref Range   Lipase 51 11 - 51 U/L  Brain natriuretic peptide  Result Value Ref Range   B Natriuretic Peptide 369.0 (H) 0.0 - 100.0 pg/mL  TSH  Result Value Ref Range   TSH 1.765 0.350 - 4.500 uIU/mL  Troponin I (High Sensitivity)  Result Value Ref Range   Troponin I (High Sensitivity) 10 <18 ng/L  Troponin I (High Sensitivity)  Result Value Ref Range   Troponin I (High Sensitivity) 10 <18 ng/L     EKG: Paced, 64, PR 150 ms, QT 526 ms, RBBB, no acute ST-T changes or significant  changes from prior EKG 02/26/2002. Monia Pouch, RNP-C  Pertinent labs & imaging results that were available during my care of the patient were reviewed by me and considered in my medical decision making.  Assessment & Plan:  [name] was seen today for follow-up.  Diagnoses and all orders for this visit:  Acute on chronic diastolic CHF (congestive heart failure) (HCC) Malaise and fatigue Exertional shortness of breath Well compensated in office today. Will recheck labs. EKG without acute changes. Discussed decreasing lasix back to 40 mg daily. Aware can take extra 20 mg of lasix for 3 pound weight gain over night or 5 pound weight gain in one week. Pt aware of symptoms which require emergent evaluation. Follow up with cardiology as discussed.  -     CBC with Differential/Platelet -     BMP8+EGFR -     Brain natriuretic peptide -     EKG 12-Lead     Continue all other maintenance medications.  Follow up plan: Return if symptoms worsen or fail to improve.   Continue healthy lifestyle choices, including diet (rich in fruits, vegetables, and lean proteins, and low in salt and simple carbohydrates) and exercise (at least 30 minutes of moderate physical activity daily).  Educational handout given for CHF  The above assessment and management plan was discussed with the patient. The patient verbalized understanding of and has agreed to the management plan. Patient is aware to call the clinic if they develop any new symptoms or if symptoms persist or worsen. Patient is aware when to return to the clinic for a follow-up visit. Patient educated on when it is appropriate to go to the emergency department.   Monia Pouch, FNP-C [facility] [phone]

## 2022-03-01 NOTE — Patient Instructions (Signed)
Follow up with cardiology as discussed.  Go back to 40 mg lasix dosing daily. Can take extra 20 mg for 3 pound weight gain over night or 5 pound weight gain in one week. Call provider if you have to increase lasix dosing.

## 2022-03-02 LAB — CBC WITH DIFFERENTIAL/PLATELET
Basophils Absolute: 0 10*3/uL (ref 0.0–0.2)
Basos: 1 %
EOS (ABSOLUTE): 0.1 10*3/uL (ref 0.0–0.4)
Eos: 3 %
Hematocrit: 29.8 % — ABNORMAL LOW (ref 34.0–46.6)
Hemoglobin: 9.9 g/dL — ABNORMAL LOW (ref 11.1–15.9)
Immature Grans (Abs): 0 10*3/uL (ref 0.0–0.1)
Immature Granulocytes: 0 %
Lymphocytes Absolute: 1.5 10*3/uL (ref 0.7–3.1)
Lymphs: 36 %
MCH: 31.9 pg (ref 26.6–33.0)
MCHC: 33.2 g/dL (ref 31.5–35.7)
MCV: 96 fL (ref 79–97)
Monocytes Absolute: 0.6 10*3/uL (ref 0.1–0.9)
Monocytes: 15 %
Neutrophils Absolute: 1.9 10*3/uL (ref 1.4–7.0)
Neutrophils: 45 %
Platelets: 136 10*3/uL — ABNORMAL LOW (ref 150–450)
RBC: 3.1 x10E6/uL — ABNORMAL LOW (ref 3.77–5.28)
RDW: 13.4 % (ref 11.7–15.4)
WBC: 4.1 10*3/uL (ref 3.4–10.8)

## 2022-03-02 LAB — BRAIN NATRIURETIC PEPTIDE: BNP: 229.8 pg/mL — ABNORMAL HIGH (ref 0.0–100.0)

## 2022-03-02 LAB — BMP8+EGFR
BUN/Creatinine Ratio: 22 (ref 12–28)
BUN: 23 mg/dL (ref 10–36)
CO2: 27 mmol/L (ref 20–29)
Calcium: 9.2 mg/dL (ref 8.7–10.3)
Chloride: 95 mmol/L — ABNORMAL LOW (ref 96–106)
Creatinine, Ser: 1.04 mg/dL — ABNORMAL HIGH (ref 0.57–1.00)
Glucose: 92 mg/dL (ref 70–99)
Potassium: 4.4 mmol/L (ref 3.5–5.2)
Sodium: 136 mmol/L (ref 134–144)
eGFR: 51 mL/min/{1.73_m2} — ABNORMAL LOW (ref >59)

## 2022-03-03 ENCOUNTER — Other Ambulatory Visit: Payer: Self-pay | Admitting: *Deleted

## 2022-03-03 DIAGNOSIS — R899 Unspecified abnormal finding in specimens from other organs, systems and tissues: Secondary | ICD-10-CM

## 2022-03-09 ENCOUNTER — Other Ambulatory Visit: Payer: Medicare Other

## 2022-03-09 DIAGNOSIS — R899 Unspecified abnormal finding in specimens from other organs, systems and tissues: Secondary | ICD-10-CM | POA: Diagnosis not present

## 2022-03-09 LAB — CBC WITH DIFFERENTIAL/PLATELET
Basophils Absolute: 0 10*3/uL (ref 0.0–0.2)
Basos: 1 %
EOS (ABSOLUTE): 0.1 10*3/uL (ref 0.0–0.4)
Eos: 4 %
Hematocrit: 28.7 % — ABNORMAL LOW (ref 34.0–46.6)
Hemoglobin: 9.9 g/dL — ABNORMAL LOW (ref 11.1–15.9)
Immature Grans (Abs): 0 10*3/uL (ref 0.0–0.1)
Immature Granulocytes: 0 %
Lymphocytes Absolute: 1.2 10*3/uL (ref 0.7–3.1)
Lymphs: 32 %
MCH: 31.2 pg (ref 26.6–33.0)
MCHC: 34.5 g/dL (ref 31.5–35.7)
MCV: 91 fL (ref 79–97)
Monocytes Absolute: 0.5 10*3/uL (ref 0.1–0.9)
Monocytes: 13 %
Neutrophils Absolute: 1.9 10*3/uL (ref 1.4–7.0)
Neutrophils: 50 %
Platelets: 147 10*3/uL — ABNORMAL LOW (ref 150–450)
RBC: 3.17 x10E6/uL — ABNORMAL LOW (ref 3.77–5.28)
RDW: 12.8 % (ref 11.7–15.4)
WBC: 3.7 10*3/uL (ref 3.4–10.8)

## 2022-03-09 LAB — BASIC METABOLIC PANEL
BUN/Creatinine Ratio: 21 (ref 12–28)
BUN: 20 mg/dL (ref 10–36)
CO2: 25 mmol/L (ref 20–29)
Calcium: 9.4 mg/dL (ref 8.7–10.3)
Chloride: 98 mmol/L (ref 96–106)
Creatinine, Ser: 0.95 mg/dL (ref 0.57–1.00)
Glucose: 110 mg/dL — ABNORMAL HIGH (ref 70–99)
Potassium: 3.9 mmol/L (ref 3.5–5.2)
Sodium: 138 mmol/L (ref 134–144)
eGFR: 57 mL/min/{1.73_m2} — ABNORMAL LOW (ref >59)

## 2022-03-16 ENCOUNTER — Encounter: Payer: Self-pay | Admitting: Family Medicine

## 2022-03-16 ENCOUNTER — Ambulatory Visit (INDEPENDENT_AMBULATORY_CARE_PROVIDER_SITE_OTHER): Payer: Medicare Other | Admitting: Family Medicine

## 2022-03-16 VITALS — BP 130/71 | HR 62 | Temp 97.4°F | Ht 63.0 in | Wt 157.0 lb

## 2022-03-16 DIAGNOSIS — I5042 Chronic combined systolic (congestive) and diastolic (congestive) heart failure: Secondary | ICD-10-CM | POA: Diagnosis not present

## 2022-03-16 DIAGNOSIS — I10 Essential (primary) hypertension: Secondary | ICD-10-CM

## 2022-03-16 DIAGNOSIS — R5383 Other fatigue: Secondary | ICD-10-CM | POA: Diagnosis not present

## 2022-03-16 DIAGNOSIS — E782 Mixed hyperlipidemia: Secondary | ICD-10-CM

## 2022-03-16 DIAGNOSIS — D509 Iron deficiency anemia, unspecified: Secondary | ICD-10-CM | POA: Diagnosis not present

## 2022-03-16 LAB — MICROSCOPIC EXAMINATION
RBC, Urine: NONE SEEN /hpf (ref 0–2)
Renal Epithel, UA: NONE SEEN /hpf

## 2022-03-16 LAB — URINALYSIS, COMPLETE
Bilirubin, UA: NEGATIVE
Glucose, UA: NEGATIVE
Ketones, UA: NEGATIVE
Nitrite, UA: NEGATIVE
Protein,UA: NEGATIVE
RBC, UA: NEGATIVE
Specific Gravity, UA: 1.01 (ref 1.005–1.030)
Urobilinogen, Ur: 2 mg/dL — ABNORMAL HIGH (ref 0.2–1.0)
pH, UA: 6.5 (ref 5.0–7.5)

## 2022-03-16 NOTE — Progress Notes (Signed)
BP 130/71   Pulse 62   Temp (!) 97.4 F (36.3 C)   Ht $R'5\' 3"'NF$  (1.6 m)   Wt 157 lb (71.2 kg)   SpO2 94%   BMI 27.81 kg/m    Subjective:   Patient ID: [name], female    DOB: 03/18/1929, 88 y.o.   MRN: [id number]  HPI: ERIE SICA is a 88 y.o. female presenting on 03/16/2022 for Medical Management of Chronic Issues, Hyperlipidemia, and Fatigue (Feels weak today)   HPI Hypertension Patient is currently on amiodarone furosemide and nadolol, and their blood pressure today is 130/71. Patient denies any lightheadedness or dizziness. Patient denies headaches, blurred vision, chest pains, shortness of breath, or weakness. Denies any side effects from medication and is content with current medication.   Hyperlipidemia Patient is coming in for recheck of his hyperlipidemia. The patient is currently taking none currently, has been intolerant of statins. They deny any issues with myalgias or history of liver damage from it. They deny any focal numbness or weakness or chest pain.   Anemia and A-fib recheck CHF Patient has anemia and A-fib and CHF and sees cardiology for the CHF and A-fib.  She also been diagnosed with sick sinus syndrome.  She is currently on Eliquis and amiodarone and furosemide and nadolol.  She has some fatigue but denies chest pain or palpitations.  Patient says that she has had decreased energy and fatigue weakness and tiredness that is been increased today.  She says she is not sleeping as well.  She denies chest pain or palpitations or shortness of breath or wheezing.  She says her weights have been stable.  She urinary burning or hematuria.  She denies any blood in her stool or diarrhea or constipation.  Relevant past medical, surgical, family and social history reviewed and updated as indicated. Interim medical history since our last visit reviewed. Allergies and medications reviewed and updated.  Review of Systems  Constitutional:  Positive for  fatigue. Negative for chills and fever.  Eyes:  Negative for visual disturbance.  Respiratory:  Negative for chest tightness and shortness of breath.   Cardiovascular:  Negative for chest pain and leg swelling.  Gastrointestinal:  Negative for abdominal pain, blood in stool, constipation, diarrhea, nausea and vomiting.  Musculoskeletal:  Negative for back pain and gait problem.  Skin:  Negative for rash.  Neurological:  Positive for weakness. Negative for dizziness, light-headedness and headaches.  Psychiatric/Behavioral:  Negative for agitation and behavioral problems.   All other systems reviewed and are negative.   Per HPI unless specifically indicated above   Allergies as of 03/16/2022       Reactions   Iodinated Contrast Media Hives, Itching, Rash   03/13/14: symptoms began within two hours of intrathecal injection (myelogram 03/11/14); on shoulders radiating down to stomach.  Relief with Benadryl.  Told patient she would need to pre-med with Benadryl in the future before receiving this contrast.  Brita Romp, RN   Naproxen Hives, Itching, Rash        Medication List        Accurate as of March 16, 2022 10:23 AM. If you have any questions, ask your nurse or doctor.          acetaminophen 500 MG tablet Commonly known as: TYLENOL Take 1,000 mg by mouth every 6 (six) hours as needed for mild pain.   albuterol 108 (90 Base) MCG/ACT inhaler Commonly known as: VENTOLIN HFA Inhale 1-2 puffs into the lungs  every 6 (six) hours as needed for wheezing or shortness of breath.   amiodarone 200 MG tablet Commonly known as: PACERONE TAKE ONE (1) TABLET EACH DAY What changed: See the new instructions.   apixaban 5 MG Tabs tablet Commonly known as: Eliquis Take 1 tablet (5 mg total) by mouth 2 (two) times daily.   Benefiber Powd Take 1 Dose by mouth daily as needed (constipation).   calcium-vitamin D 500-200 MG-UNIT tablet Commonly known as: OSCAL WITH D Take 1 tablet by  mouth daily with breakfast.   cyanocobalamin 1000 MCG/ML injection Commonly known as: VITAMIN B12 Inject 1 mL (1,000 mcg total) into the muscle every 30 (thirty) days.   furosemide 40 MG tablet Commonly known as: LASIX Take 1 tablet (40 mg total) by mouth daily. May take an extra 20 mg daily as needed for leg swelling or weight gain What changed:  how much to take when to take this additional instructions   latanoprost 0.005 % ophthalmic solution Commonly known as: XALATAN Place 1 drop into both eyes at bedtime.   multivitamin tablet Take 1 tablet by mouth every morning.   nadolol 40 MG tablet Commonly known as: CORGARD TAKE 1 AND 1/2 TABLETS DAILY   nitroGLYCERIN 0.4 MG SL tablet Commonly known as: NITROSTAT DISSOLVE 1 TAB UNDER TOUNGE FOR CHEST PAIN. MAY REPEAT EVERY 5 MINUTES FOR 3 DOSES. IF NO RELIEF CALL 911 OR GO TO ER What changed: See the new instructions.   pantoprazole 40 MG tablet Commonly known as: PROTONIX TAKE ONE (1) TABLET BY MOUTH EVERY DAY What changed: how much to take         Objective:   BP 130/71   Pulse 62   Temp (!) 97.4 F (36.3 C)   Ht $R'5\' 3"'XE$  (1.6 m)   Wt 157 lb (71.2 kg)   SpO2 94%   BMI 27.81 kg/m   Wt Readings from Last 3 Encounters:  03/16/22 157 lb (71.2 kg)  03/01/22 155 lb (70.3 kg)  02/25/22 154 lb (69.9 kg)    Physical Exam Vitals and nursing note reviewed.  Constitutional:      General: She is not in acute distress.    Appearance: She is well-developed. She is not diaphoretic.  Eyes:     Conjunctiva/sclera: Conjunctivae normal.  Cardiovascular:     Rate and Rhythm: Normal rate and regular rhythm.     Heart sounds: Normal heart sounds. No murmur heard. Pulmonary:     Effort: Pulmonary effort is normal. No respiratory distress.     Breath sounds: Normal breath sounds. No wheezing.  Abdominal:     General: Abdomen is flat. Bowel sounds are normal. There is no distension.     Tenderness: There is no abdominal  tenderness. There is no right CVA tenderness, left CVA tenderness, guarding or rebound.  Musculoskeletal:        General: No tenderness. Normal range of motion.  Skin:    General: Skin is warm and dry.     Findings: No rash.  Neurological:     Mental Status: She is alert and oriented to person, place, and time.     Coordination: Coordination normal.  Psychiatric:        Behavior: Behavior normal.       Assessment & Plan:   Problem List Items Addressed This Visit       Cardiovascular and Mediastinum   Essential hypertension, benign (Chronic)   Relevant Orders   CMP14+EGFR   Chronic CHF (congestive heart  failure) ([place])   Relevant Orders   CBC with Differential/Platelet   CMP14+EGFR   TSH     Other   Hyperlipidemia - Primary (Chronic)   Relevant Orders   Lipid panel   Iron deficiency anemia (Chronic)   Relevant Orders   CBC with Differential/Platelet   TSH   Other Visit Diagnoses     Low energy       Relevant Orders   CBC with Differential/Platelet   CMP14+EGFR   Lipid panel   TSH   Urinalysis, Complete   Urine Culture       Due to current medicine, will check blood work today and a urine to make sure no sign of infection.  Recommend she try melatonin for sleep aid. Follow up plan: Return in about 6 months (around 09/15/2022), or if symptoms worsen or fail to improve, for Hypertension and CHF and hyperlipidemia recheck.  Counseling provided for all of the vaccine components Orders Placed This Encounter  Procedures   Urine Culture   CBC with Differential/Platelet   CMP14+EGFR   Lipid panel   TSH   Urinalysis, Complete    Caryl Pina, MD [facility] 03/16/2022, 10:23 AM

## 2022-03-17 LAB — CBC WITH DIFFERENTIAL/PLATELET
Basophils Absolute: 0 10*3/uL (ref 0.0–0.2)
Basos: 1 %
EOS (ABSOLUTE): 0.1 10*3/uL (ref 0.0–0.4)
Eos: 3 %
Hematocrit: 30.2 % — ABNORMAL LOW (ref 34.0–46.6)
Hemoglobin: 10 g/dL — ABNORMAL LOW (ref 11.1–15.9)
Immature Grans (Abs): 0 10*3/uL (ref 0.0–0.1)
Immature Granulocytes: 0 %
Lymphocytes Absolute: 1.2 10*3/uL (ref 0.7–3.1)
Lymphs: 32 %
MCH: 31 pg (ref 26.6–33.0)
MCHC: 33.1 g/dL (ref 31.5–35.7)
MCV: 94 fL (ref 79–97)
Monocytes Absolute: 0.5 10*3/uL (ref 0.1–0.9)
Monocytes: 13 %
Neutrophils Absolute: 1.8 10*3/uL (ref 1.4–7.0)
Neutrophils: 51 %
Platelets: 138 10*3/uL — ABNORMAL LOW (ref 150–450)
RBC: 3.23 x10E6/uL — ABNORMAL LOW (ref 3.77–5.28)
RDW: 13.6 % (ref 11.7–15.4)
WBC: 3.6 10*3/uL (ref 3.4–10.8)

## 2022-03-17 LAB — CMP14+EGFR
ALT: 17 IU/L (ref 0–32)
AST: 23 IU/L (ref 0–40)
Albumin/Globulin Ratio: 2 (ref 1.2–2.2)
Albumin: 4.7 g/dL — ABNORMAL HIGH (ref 3.6–4.6)
Alkaline Phosphatase: 69 IU/L (ref 44–121)
BUN/Creatinine Ratio: 15 (ref 12–28)
BUN: 19 mg/dL (ref 10–36)
Bilirubin Total: 0.6 mg/dL (ref 0.0–1.2)
CO2: 25 mmol/L (ref 20–29)
Calcium: 10 mg/dL (ref 8.7–10.3)
Chloride: 94 mmol/L — ABNORMAL LOW (ref 96–106)
Creatinine, Ser: 1.27 mg/dL — ABNORMAL HIGH (ref 0.57–1.00)
Globulin, Total: 2.3 g/dL (ref 1.5–4.5)
Glucose: 87 mg/dL (ref 70–99)
Potassium: 4.3 mmol/L (ref 3.5–5.2)
Sodium: 135 mmol/L (ref 134–144)
Total Protein: 7 g/dL (ref 6.0–8.5)
eGFR: 40 mL/min/{1.73_m2} — ABNORMAL LOW (ref >59)

## 2022-03-17 LAB — LIPID PANEL
Chol/HDL Ratio: 2.8 ratio (ref 0.0–4.4)
Cholesterol, Total: 162 mg/dL (ref 100–199)
HDL: 57 mg/dL (ref >39)
LDL Chol Calc (NIH): 90 mg/dL (ref 0–99)
Triglycerides: 76 mg/dL (ref 0–149)
VLDL Cholesterol Cal: 15 mg/dL (ref 5–40)

## 2022-03-17 LAB — URINE CULTURE

## 2022-03-17 LAB — TSH: TSH: 1.79 u[IU]/mL (ref 0.450–4.500)

## 2022-03-27 ENCOUNTER — Other Ambulatory Visit: Payer: Self-pay | Admitting: Family Medicine

## 2022-03-28 DIAGNOSIS — I83891 Varicose veins of right lower extremities with other complications: Secondary | ICD-10-CM | POA: Diagnosis not present

## 2022-03-28 DIAGNOSIS — I87391 Chronic venous hypertension (idiopathic) with other complications of right lower extremity: Secondary | ICD-10-CM | POA: Diagnosis not present

## 2022-04-03 ENCOUNTER — Ambulatory Visit (INDEPENDENT_AMBULATORY_CARE_PROVIDER_SITE_OTHER): Payer: Medicare Other

## 2022-04-03 DIAGNOSIS — I4819 Other persistent atrial fibrillation: Secondary | ICD-10-CM | POA: Diagnosis not present

## 2022-04-04 LAB — CUP PACEART REMOTE DEVICE CHECK
Battery Remaining Longevity: 106 mo
Battery Remaining Percentage: 95.5 %
Battery Voltage: 3.01 V
Brady Statistic AP VP Percent: 99 %
Brady Statistic AP VS Percent: 1 %
Brady Statistic AS VP Percent: 1 %
Brady Statistic AS VS Percent: 0 %
Brady Statistic RA Percent Paced: 99 %
Brady Statistic RV Percent Paced: 99 %
Date Time Interrogation Session: 20231023020013
Implantable Lead Connection Status: 753985
Implantable Lead Connection Status: 753985
Implantable Lead Implant Date: 20031028
Implantable Lead Implant Date: 20031028
Implantable Lead Location: 753859
Implantable Lead Location: 753860
Implantable Pulse Generator Implant Date: 20230421
Lead Channel Impedance Value: 360 Ohm
Lead Channel Impedance Value: 450 Ohm
Lead Channel Pacing Threshold Amplitude: 0.5 V
Lead Channel Pacing Threshold Amplitude: 0.625 V
Lead Channel Pacing Threshold Pulse Width: 0.5 ms
Lead Channel Pacing Threshold Pulse Width: 0.5 ms
Lead Channel Sensing Intrinsic Amplitude: 12 mV
Lead Channel Setting Pacing Amplitude: 0.875
Lead Channel Setting Pacing Amplitude: 2 V
Lead Channel Setting Pacing Pulse Width: 0.5 ms
Lead Channel Setting Sensing Sensitivity: 4 mV
Pulse Gen Model: 2272
Pulse Gen Serial Number: 8074688

## 2022-04-10 ENCOUNTER — Telehealth: Payer: Self-pay | Admitting: Family Medicine

## 2022-04-10 MED ORDER — CYANOCOBALAMIN 1000 MCG/ML IJ SOLN: 1000.0000 ug | INTRAMUSCULAR | 3 refills | Status: DC

## 2022-04-10 NOTE — Telephone Encounter (Signed)
Prescription sent to pharmacy, patient aware 

## 2022-04-10 NOTE — Telephone Encounter (Signed)
Yes go ahead and call in the same dose that she usually gets for her B12 shot called to the prescription to the pharmacy.

## 2022-04-10 NOTE — Telephone Encounter (Signed)
Patient is unable to come for her B12 shot, she wants to know if she can get a prescription called in to the pharmacy for the B12 shot. Said she has done this before. Please call back and advise.

## 2022-04-13 ENCOUNTER — Encounter (HOSPITAL_COMMUNITY): Payer: Self-pay | Admitting: Emergency Medicine

## 2022-04-13 ENCOUNTER — Emergency Department (HOSPITAL_COMMUNITY): Payer: Medicare Other

## 2022-04-13 ENCOUNTER — Emergency Department (HOSPITAL_COMMUNITY)
Admission: EM | Admit: 2022-04-13 | Discharge: 2022-04-13 | Disposition: A | Discharge status: Home / Self Care | Payer: Medicare Other | Attending: Emergency Medicine | Admitting: Emergency Medicine

## 2022-04-13 ENCOUNTER — Other Ambulatory Visit: Payer: Self-pay

## 2022-04-13 DIAGNOSIS — R0689 Other abnormalities of breathing: Secondary | ICD-10-CM | POA: Diagnosis not present

## 2022-04-13 DIAGNOSIS — Z79899 Other long term (current) drug therapy: Secondary | ICD-10-CM | POA: Insufficient documentation

## 2022-04-13 DIAGNOSIS — R6889 Other general symptoms and signs: Secondary | ICD-10-CM | POA: Diagnosis not present

## 2022-04-13 DIAGNOSIS — Z7901 Long term (current) use of anticoagulants: Secondary | ICD-10-CM | POA: Insufficient documentation

## 2022-04-13 DIAGNOSIS — I509 Heart failure, unspecified: Secondary | ICD-10-CM | POA: Diagnosis not present

## 2022-04-13 DIAGNOSIS — R079 Chest pain, unspecified: Secondary | ICD-10-CM | POA: Diagnosis not present

## 2022-04-13 DIAGNOSIS — R0789 Other chest pain: Secondary | ICD-10-CM | POA: Insufficient documentation

## 2022-04-13 DIAGNOSIS — I110 Hypertensive heart disease with heart failure: Secondary | ICD-10-CM | POA: Insufficient documentation

## 2022-04-13 DIAGNOSIS — Z743 Need for continuous supervision: Secondary | ICD-10-CM | POA: Diagnosis not present

## 2022-04-13 LAB — CBC
HCT: 29.9 % — ABNORMAL LOW (ref 36.0–46.0)
Hemoglobin: 9.8 g/dL — ABNORMAL LOW (ref 12.0–15.0)
MCH: 31.1 pg (ref 26.0–34.0)
MCHC: 32.8 g/dL (ref 30.0–36.0)
MCV: 94.9 fL (ref 80.0–100.0)
Platelets: 121 10*3/uL — ABNORMAL LOW (ref 150–400)
RBC: 3.15 MIL/uL — ABNORMAL LOW (ref 3.87–5.11)
RDW: 15.2 % (ref 11.5–15.5)
WBC: 4.5 10*3/uL (ref 4.0–10.5)
nRBC: 0 % (ref 0.0–0.2)

## 2022-04-13 LAB — BASIC METABOLIC PANEL
Anion gap: 9 (ref 5–15)
BUN: 21 mg/dL (ref 8–23)
CO2: 28 mmol/L (ref 22–32)
Calcium: 9.4 mg/dL (ref 8.9–10.3)
Chloride: 96 mmol/L — ABNORMAL LOW (ref 98–111)
Creatinine, Ser: 1.06 mg/dL — ABNORMAL HIGH (ref 0.44–1.00)
GFR, Estimated: 50 mL/min — ABNORMAL LOW (ref >60)
Glucose, Bld: 116 mg/dL — ABNORMAL HIGH (ref 70–99)
Potassium: 4 mmol/L (ref 3.5–5.1)
Sodium: 133 mmol/L — ABNORMAL LOW (ref 135–145)

## 2022-04-13 LAB — PROTIME-INR
INR: 1.5 — ABNORMAL HIGH (ref 0.8–1.2)
Prothrombin Time: 18 seconds — ABNORMAL HIGH (ref 11.4–15.2)

## 2022-04-13 LAB — TROPONIN I (HIGH SENSITIVITY)
Troponin I (High Sensitivity): 8 ng/L (ref <18)
Troponin I (High Sensitivity): 10 ng/L (ref <18)

## 2022-04-13 MED ORDER — MORPHINE SULFATE (PF) 2 MG/ML IV SOLN
2.0000 mg | Freq: Once | INTRAVENOUS | Status: AC
  Administered 2022-04-13: 2 mg via INTRAVENOUS
  Filled 2022-04-13: qty 1

## 2022-04-13 NOTE — ED Triage Notes (Signed)
Pt c/o chest/epigastric pain. She has had a total of 3 nitros and pain is better.

## 2022-04-13 NOTE — ED Provider Notes (Signed)
[facility] EMERGENCY DEPARTMENT Provider Note   CSN: [id number] Arrival date & time: 04/13/22  0158     History  Chief Complaint  Patient presents with   Chest Pain    [name] is a 88 y.o. female.  Patient is a 88 year old female with past medical history of atrial fibrillation, CHF, pacemaker placement, hypertension, hyperlipidemia.  Patient presenting today with complaints of chest discomfort.  This started approximately 11:00 this evening.  Symptoms began in the absence of any injury or trauma.  She describes a "pressure" to the center of her chest with no radiation to the arm or jaw.  She denies any shortness of breath, nausea, diaphoresis.  Patient did have a previous ER visit in September with similar complaints, but no cardiac cause was identified.  Patient took nitroglycerin at home with little relief.  The history is provided by the patient.       Home Medications Prior to Admission medications   Medication Sig Start Date End Date Taking? Authorizing Provider  acetaminophen (TYLENOL) 500 MG tablet Take 1,000 mg by mouth every 6 (six) hours as needed for mild pain.    [provider]  albuterol (VENTOLIN HFA) 108 (90 Base) MCG/ACT inhaler Inhale 1-2 puffs into the lungs every 6 (six) hours as needed for wheezing or shortness of breath. 02/25/22   Jeanell Sparrow, DO  amiodarone (PACERONE) 200 MG tablet TAKE ONE (1) TABLET EACH DAY Patient taking differently: Take 200 mg by mouth daily. 02/24/22   Dettinger, Fransisca Kaufmann, MD  apixaban (ELIQUIS) 5 MG TABS tablet Take 1 tablet (5 mg total) by mouth 2 (two) times daily. 12/08/21   Dettinger, Fransisca Kaufmann, MD  calcium-vitamin D (OSCAL WITH D) 500-200 MG-UNIT tablet Take 1 tablet by mouth daily with breakfast.     [provider]  cyanocobalamin (VITAMIN B12) 1000 MCG/ML injection Inject 1 mL (1,000 mcg total) into the muscle every 30 (thirty) days. 04/10/22   Dettinger, Fransisca Kaufmann, MD  furosemide (LASIX) 40 MG  tablet Take 1 tablet (40 mg total) by mouth daily. May take an extra 20 mg daily as needed for leg swelling or weight gain Patient taking differently: Take 20-40 mg by mouth See admin instructions. Take 1 tablet by mouth once daily. May take an extra 20 mg daily as needed for leg swelling or weight gain 01/17/22   Satira Sark, MD  latanoprost (XALATAN) 0.005 % ophthalmic solution Place 1 drop into both eyes at bedtime.    [provider]  Multiple Vitamin (MULTIVITAMIN) tablet Take 1 tablet by mouth every morning.    [provider]  nadolol (CORGARD) 40 MG tablet TAKE 1 AND 1/2 TABLETS DAILY 03/27/22   Dettinger, Fransisca Kaufmann, MD  nitroGLYCERIN (NITROSTAT) 0.4 MG SL tablet DISSOLVE 1 TAB UNDER TOUNGE FOR CHEST PAIN. MAY REPEAT EVERY 5 MINUTES FOR 3 DOSES. IF NO RELIEF CALL 911 OR GO TO ER Patient taking differently: Place 0.4 mg under the tongue every 5 (five) minutes as needed for chest pain. 09/10/20   Satira Sark, MD  pantoprazole (PROTONIX) 40 MG tablet TAKE ONE (1) TABLET BY MOUTH EVERY DAY Patient taking differently: Take 40 mg by mouth daily. 02/24/22   Dettinger, Fransisca Kaufmann, MD  Wheat Dextrin (BENEFIBER) POWD Take 1 Dose by mouth daily as needed (constipation).    [provider]      Allergies    Iodinated contrast media and Naproxen    Review of Systems   Review  of Systems  All other systems reviewed and are negative.   Physical Exam Updated Vital Signs BP (!) 149/74   Pulse 61   Temp 97.6 F (36.4 C)   Resp 14   Ht '5\' 3"'$  (1.6 m)   Wt 71.2 kg   SpO2 99%   BMI 27.81 kg/m  Physical Exam Vitals and nursing note reviewed.  Constitutional:      General: She is not in acute distress.    Appearance: She is well-developed. She is not diaphoretic.  HENT:     Head: Normocephalic and atraumatic.  Cardiovascular:     Rate and Rhythm: Normal rate and regular rhythm.     Heart sounds: No murmur heard.    No friction rub. No gallop.  Pulmonary:      Effort: Pulmonary effort is normal. No respiratory distress.     Breath sounds: Normal breath sounds. No wheezing.  Abdominal:     General: Bowel sounds are normal. There is no distension.     Palpations: Abdomen is soft.     Tenderness: There is no abdominal tenderness.  Musculoskeletal:        General: Normal range of motion.     Cervical back: Normal range of motion and neck supple.     Right lower leg: No tenderness. No edema.     Left lower leg: No tenderness. No edema.  Skin:    General: Skin is warm and dry.  Neurological:     General: No focal deficit present.     Mental Status: She is alert and oriented to person, place, and time.     ED Results / Procedures / Treatments   Labs (all labs ordered are listed, but only abnormal results are displayed) Labs Reviewed  BASIC METABOLIC PANEL  CBC  PROTIME-INR  TROPONIN I (HIGH SENSITIVITY)    EKG EKG Interpretation  Date/Time:  Thursday April 13 2022 02:05:16 EDT Ventricular Rate:  62 PR Interval:    QRS Duration: 202 QT Interval:  510 QTC Calculation: 518 R Axis:   -75 Text Interpretation: Ventricular Paced Rhythm Confirmed by Veryl Speak [phone]) on 04/13/2022 2:16:40 AM  Radiology DG Chest 2 View  Result Date: 04/13/2022 CLINICAL DATA:  Chest pain EXAM: CHEST - 2 VIEW COMPARISON:  02/25/2022 FINDINGS: Right pacer remains in place, unchanged. Cardiomegaly. No confluent opacities or effusions. No acute bony abnormality. IMPRESSION: Cardiomegaly.  No active disease. Electronically Signed   By: Rolm Baptise M.D.   On: 04/13/2022 02:27    Procedures Procedures    Medications Ordered in ED Medications - No data to display  ED Course/ Medical Decision Making/ A&P  Patient is a 88 year old female with history of atrial fibrillation, CHF, and prior pacemaker placement, but no documented coronary artery disease.  She is presenting today with complaints of chest discomfort.  Patient arrives here with stable vital  signs and oxygen saturations of 100%.  She is not significantly hypertensive and there is no evidence for heart failure on physical exam.  Her lungs are clear and extremities are without edema.  Work-up initiated including CBC, metabolic panel, and troponin x2, all of which are negative.  Chest x-ray shows cardiomegaly, but no other acute process.  Her EKG is showing a paced rhythm unchanged from prior studies.  Patient has undergone second troponin and symptoms have significantly improved after receiving 2 mg of morphine.  At this point, nothing suggests a cardiac etiology and I feel as though patient can safely be discharged.  In the past, she has had similar symptoms related to "fluid".  She does report a several pound weight gain recently.  I will advise her to increase her Lasix to 60 mg for the next 3 days and follow-up with primary doctor.  Final Clinical Impression(s) / ED Diagnoses Final diagnoses:  None    Rx / DC Orders ED Discharge Orders     None         Veryl Speak, MD 04/13/22 [phone]

## 2022-04-13 NOTE — Discharge Instructions (Signed)
Increase your Lasix to 60 mg daily for the next 3 or 4 days.  Follow-up with your primary doctor in the next few days, and return to the ER if symptoms significantly worsen or change.

## 2022-04-20 ENCOUNTER — Telehealth: Payer: Self-pay | Admitting: *Deleted

## 2022-04-20 NOTE — Telephone Encounter (Signed)
     Patient  visit on 04/13/2022  at [facility] was for chest pain  Have you been able to follow up with your primary care physician? yes  The patient was able to obtain any needed medicine or equipment.  Are there diet recommendations that you are having difficulty following?  Patient expresses understanding of discharge instructions and education provided has no other needs at this time.    [place] [phone] [address] , [place] [address] Email : [email or web address]

## 2022-04-24 NOTE — Progress Notes (Signed)
Remote pacemaker transmission.   

## 2022-05-11 ENCOUNTER — Encounter (HOSPITAL_COMMUNITY): Payer: Self-pay

## 2022-05-11 ENCOUNTER — Emergency Department (HOSPITAL_COMMUNITY): Payer: Medicare Other

## 2022-05-11 ENCOUNTER — Emergency Department (HOSPITAL_COMMUNITY)
Admission: EM | Admit: 2022-05-11 | Discharge: 2022-05-11 | Disposition: A | Discharge status: Home / Self Care | Payer: Medicare Other | Attending: Emergency Medicine | Admitting: Emergency Medicine

## 2022-05-11 ENCOUNTER — Other Ambulatory Visit: Payer: Self-pay

## 2022-05-11 DIAGNOSIS — I509 Heart failure, unspecified: Secondary | ICD-10-CM | POA: Insufficient documentation

## 2022-05-11 DIAGNOSIS — I4891 Unspecified atrial fibrillation: Secondary | ICD-10-CM | POA: Diagnosis not present

## 2022-05-11 DIAGNOSIS — Z950 Presence of cardiac pacemaker: Secondary | ICD-10-CM | POA: Insufficient documentation

## 2022-05-11 DIAGNOSIS — Z7901 Long term (current) use of anticoagulants: Secondary | ICD-10-CM | POA: Diagnosis not present

## 2022-05-11 DIAGNOSIS — R0789 Other chest pain: Secondary | ICD-10-CM | POA: Diagnosis not present

## 2022-05-11 DIAGNOSIS — R079 Chest pain, unspecified: Secondary | ICD-10-CM | POA: Diagnosis not present

## 2022-05-11 DIAGNOSIS — R6889 Other general symptoms and signs: Secondary | ICD-10-CM | POA: Diagnosis not present

## 2022-05-11 DIAGNOSIS — Z743 Need for continuous supervision: Secondary | ICD-10-CM | POA: Diagnosis not present

## 2022-05-11 LAB — CBC WITH DIFFERENTIAL/PLATELET
Abs Immature Granulocytes: 0.01 10*3/uL (ref 0.00–0.07)
Basophils Absolute: 0 10*3/uL (ref 0.0–0.1)
Basophils Relative: 1 %
Eosinophils Absolute: 0.1 10*3/uL (ref 0.0–0.5)
Eosinophils Relative: 3 %
HCT: 30.9 % — ABNORMAL LOW (ref 36.0–46.0)
Hemoglobin: 9.8 g/dL — ABNORMAL LOW (ref 12.0–15.0)
Immature Granulocytes: 0 %
Lymphocytes Relative: 27 %
Lymphs Abs: 1 10*3/uL (ref 0.7–4.0)
MCH: 30.2 pg (ref 26.0–34.0)
MCHC: 31.7 g/dL (ref 30.0–36.0)
MCV: 95.4 fL (ref 80.0–100.0)
Monocytes Absolute: 0.5 10*3/uL (ref 0.1–1.0)
Monocytes Relative: 14 %
Neutro Abs: 2 10*3/uL (ref 1.7–7.7)
Neutrophils Relative %: 55 %
Platelets: 131 10*3/uL — ABNORMAL LOW (ref 150–400)
RBC: 3.24 MIL/uL — ABNORMAL LOW (ref 3.87–5.11)
RDW: 15.9 % — ABNORMAL HIGH (ref 11.5–15.5)
WBC: 3.6 10*3/uL — ABNORMAL LOW (ref 4.0–10.5)
nRBC: 0 % (ref 0.0–0.2)

## 2022-05-11 LAB — BASIC METABOLIC PANEL
Anion gap: 10 (ref 5–15)
BUN: 19 mg/dL (ref 8–23)
CO2: 28 mmol/L (ref 22–32)
Calcium: 9.4 mg/dL (ref 8.9–10.3)
Chloride: 100 mmol/L (ref 98–111)
Creatinine, Ser: 0.92 mg/dL (ref 0.44–1.00)
GFR, Estimated: 59 mL/min — ABNORMAL LOW (ref >60)
Glucose, Bld: 100 mg/dL — ABNORMAL HIGH (ref 70–99)
Potassium: 3.8 mmol/L (ref 3.5–5.1)
Sodium: 138 mmol/L (ref 135–145)

## 2022-05-11 LAB — TROPONIN I (HIGH SENSITIVITY)
Troponin I (High Sensitivity): 9 ng/L (ref <18)
Troponin I (High Sensitivity): 9 ng/L (ref <18)

## 2022-05-11 NOTE — ED Provider Notes (Signed)
[facility] EMERGENCY DEPARTMENT Provider Note   CSN: [id number] Arrival date & time: 05/11/22  1722     History  Chief Complaint  Patient presents with   Chest Pain    [place] B Hawn is a 88 y.o. female.  Patient is a 88 year old female with a past medical history of A-fib on Eliquis, CHF, pacemaker in place presenting to the emergency department with chest pain.  The patient states around 4:00 PM this afternoon she was at rest when she started to develop chest pain.  She states that it felt like a pressure type of discomfort and moved around across her chest.  She denied any associated shortness of breath, diaphoresis, nausea or vomiting.  She denies any increase of swelling in her lower extremities.  She denies any new fever or cough.  She states that it similar to previous chest pain she has had before but it has not recurred for a while.  She states that her pain improved after her medications and treatment by medics.  The history is provided by the patient.  Chest Pain      Home Medications Prior to Admission medications   Medication Sig Start Date End Date Taking? Authorizing Provider  acetaminophen (TYLENOL) 500 MG tablet Take 1,000 mg by mouth every 6 (six) hours as needed for mild pain.   Yes [provider]  albuterol (VENTOLIN HFA) 108 (90 Base) MCG/ACT inhaler Inhale 1-2 puffs into the lungs every 6 (six) hours as needed for wheezing or shortness of breath. 02/25/22  Yes Jeanell Sparrow, DO  amiodarone (PACERONE) 200 MG tablet TAKE ONE (1) TABLET EACH DAY Patient taking differently: Take 200 mg by mouth daily. 02/24/22  Yes Dettinger, Fransisca Kaufmann, MD  apixaban (ELIQUIS) 5 MG TABS tablet Take 1 tablet (5 mg total) by mouth 2 (two) times daily. 12/08/21  Yes Dettinger, Fransisca Kaufmann, MD  calcium-vitamin D (OSCAL WITH D) 500-200 MG-UNIT tablet Take 1 tablet by mouth daily with breakfast.    Yes [provider]  cyanocobalamin (VITAMIN B12) 1000 MCG/ML injection  Inject 1 mL (1,000 mcg total) into the muscle every 30 (thirty) days. 04/10/22  Yes Dettinger, Fransisca Kaufmann, MD  furosemide (LASIX) 40 MG tablet Take 1 tablet (40 mg total) by mouth daily. May take an extra 20 mg daily as needed for leg swelling or weight gain Patient taking differently: Take 20-40 mg by mouth See admin instructions. Take 1 tablet by mouth once daily. May take an extra 20 mg daily as needed for leg swelling or weight gain 01/17/22  Yes Satira Sark, MD  latanoprost (XALATAN) 0.005 % ophthalmic solution Place 1 drop into both eyes at bedtime.   Yes [provider]  Multiple Vitamin (MULTIVITAMIN) tablet Take 1 tablet by mouth every morning.   Yes [provider]  nadolol (CORGARD) 40 MG tablet TAKE 1 AND 1/2 TABLETS DAILY 03/27/22  Yes Dettinger, Fransisca Kaufmann, MD  nitroGLYCERIN (NITROSTAT) 0.4 MG SL tablet DISSOLVE 1 TAB UNDER TOUNGE FOR CHEST PAIN. MAY REPEAT EVERY 5 MINUTES FOR 3 DOSES. IF NO RELIEF CALL 911 OR GO TO ER Patient taking differently: Place 0.4 mg under the tongue every 5 (five) minutes as needed for chest pain. 09/10/20  Yes Satira Sark, MD  OVER THE COUNTER MEDICATION Take 1 tablet by mouth daily. calcium   Yes [provider]  pantoprazole (PROTONIX) 40 MG tablet TAKE ONE (1) TABLET BY MOUTH EVERY DAY Patient taking differently: Take 40 mg by mouth  daily. 02/24/22  Yes Dettinger, Fransisca Kaufmann, MD  Wheat Dextrin (BENEFIBER) POWD Take 1 Dose by mouth daily as needed (constipation).   Yes [provider]      Allergies    Iodinated contrast media and Naproxen    Review of Systems   Review of Systems  Cardiovascular:  Positive for chest pain.    Physical Exam Updated Vital Signs BP 114/64   Pulse (!) 59   Temp 98.1 F (36.7 C)   Resp 14   Ht '5\' 3"'$  (1.6 m)   Wt 72 kg   SpO2 97%   BMI 28.12 kg/m  Physical Exam Vitals and nursing note reviewed.  Constitutional:      General: She is not in acute distress.    Appearance:  She is well-developed.  HENT:     Head: Normocephalic and atraumatic.  Eyes:     Extraocular Movements: Extraocular movements intact.  Cardiovascular:     Rate and Rhythm: Normal rate and regular rhythm.     Pulses:          Radial pulses are 2+ on the right side and 2+ on the left side.     Heart sounds: Normal heart sounds.  Pulmonary:     Effort: Pulmonary effort is normal.     Breath sounds: Normal breath sounds.  Chest:     Chest wall: Tenderness (R-sided chest wall) present.  Abdominal:     Palpations: Abdomen is soft.     Tenderness: There is no abdominal tenderness.  Musculoskeletal:        General: Normal range of motion.     Cervical back: Normal range of motion and neck supple.     Right lower leg: No edema.     Left lower leg: No edema.  Skin:    General: Skin is warm and dry.  Neurological:     General: No focal deficit present.     Mental Status: She is alert and oriented to person, place, and time.  Psychiatric:        Mood and Affect: Mood normal.        Behavior: Behavior normal.     ED Results / Procedures / Treatments   Labs (all labs ordered are listed, but only abnormal results are displayed) Labs Reviewed  BASIC METABOLIC PANEL - Abnormal; Notable for the following components:      Result Value   Glucose, Bld 100 (*)    GFR, Estimated 59 (*)    All other components within normal limits  CBC WITH DIFFERENTIAL/PLATELET - Abnormal; Notable for the following components:   WBC 3.6 (*)    RBC 3.24 (*)    Hemoglobin 9.8 (*)    HCT 30.9 (*)    RDW 15.9 (*)    Platelets 131 (*)    All other components within normal limits  TROPONIN I (HIGH SENSITIVITY)  TROPONIN I (HIGH SENSITIVITY)    EKG EKG Interpretation  Date/Time:  Thursday May 11 2022 17:38:44 EST Ventricular Rate:  60 PR Interval:  351 QRS Duration: 203 QT Interval:  544 QTC Calculation: 544 R Axis:   -73 Text Interpretation: Atrial-ventricular dual-paced rhythm No significant  change since last tracing Confirmed by Leanord Asal ([id number]) on 05/11/2022 6:14:46 PM  Radiology DG Chest 2 View  Result Date: 05/11/2022 CLINICAL DATA:  Chest pain and tightness. EXAM: CHEST - 2 VIEW COMPARISON:  Chest x-ray dated April 13, 2022. FINDINGS: Unchanged right chest wall AICD and cardiomegaly. No focal consolidation,  pleural effusion, or pneumothorax. No acute osseous abnormality. IMPRESSION: No active cardiopulmonary disease. Electronically Signed   By: Titus Dubin M.D.   On: 05/11/2022 18:40    Procedures Procedures    Medications Ordered in ED Medications - No data to display  ED Course/ Medical Decision Making/ A&P Clinical Course as of 05/11/22 2144  Thu May 11, 2022  1956 Initial troponin is negative.  Patient continues to feel as though she is having some pressure in her chest.  Chest x-ray shows no evidence of any acute disease and she does not have any wheezing on exam.  Remainder of labs are at her baseline.  Repeat troponin will be performed and she will continue to be monitored. [VK]  2139 Repeat troponin is negative.  Patient remains a stable appearing.  Her daughter at bedside states that she has had similar symptoms in the past with extra fluid though the patient does not appear clinically volume overloaded and no evidence of pulmonary edema on chest x-ray.  She was recommended that she can try a dose of Lasix tonight in addition to her normal Lasix.  She was recommended to follow-up with her cardiologist in the next couple of days to have her symptoms rechecked and was given strict return precautions. [VK]    Clinical Course User Index [VK] Kemper Durie, DO                           Medical Decision Making This patient presents to the ED with chief complaint(s) of chest pain with pertinent past medical history of a fib on Eliquis, SSS with pacemaker in place, CHF which further complicates the presenting complaint. The complaint involves an  extensive differential diagnosis and also carries with it a high risk of complications and morbidity.    The differential diagnosis includes ACS, arrhythmia, anemia pulmonary edema, pleural effusion, pneumonia, pneumothorax, costochondritis, dissection unlikely as pain is nonradiating and equal pulses in all 4 extremities, PE unlikely as patient has been compliant on her anticoagulant and is not tachycardic, tachypneic or hypoxic  Additional history obtained: Additional history obtained from EMS  Records reviewed Primary Care Documents  ED Course and Reassessment: Upon patient's arrival to the emergency department she is awake alert well-appearing in no acute distress.  Initial EKG performed had no acute ischemic changes.  She will have labs including troponin and will be closely reassessed.  She has no wheezing on exam making COPD exacerbation unlikely.  Independent labs interpretation:  The following labs were independently interpreted: At patient's baseline, no acute abnormalities  Independent visualization of imaging: - I independently visualized the following imaging with scope of interpretation limited to determining acute life threatening conditions related to emergency care: Chest x-ray, which revealed no acute disease  Consultation: - Consulted or discussed management/test interpretation w/ external professional: N/A  Consideration for admission or further workup: Patient has no emergent conditions requiring admission or further work-up at this time and is stable for discharge home with primary care and cardiology follow-up  Social Determinants of health: N/A    Amount and/or Complexity of Data Reviewed Labs: ordered. Radiology: ordered.          Final Clinical Impression(s) / ED Diagnoses Final diagnoses:  Nonspecific chest pain    Rx / DC Orders ED Discharge Orders     None         Kemper Durie, DO 05/11/22 2144

## 2022-05-11 NOTE — ED Triage Notes (Signed)
Patient complains of complete chest pain that moved from side to side with tightness in chest. Patient took 1 nitro without relief. EMS gave 22mg fentanyl IV en route. Patient alert & oriented in triage. 6/10 chest tightness and denies pain.

## 2022-05-11 NOTE — Discharge Instructions (Signed)
You were seen in the emergency department for your chest pain.  Your workup showed no signs of heart attack or stress on your heart and no obvious signs of extra fluid in your lungs.  You can try taking an extra dose of your Lasix tonight to see if that seems to be helping with your symptoms.  You should follow-up with your cardiologist in the next couple of days to have your symptoms rechecked.  You should return to the emergency department for worsening chest pain, worsening shortness of breath, if you pass out or if you have any other new or concerning symptoms.

## 2022-05-18 NOTE — Progress Notes (Signed)
Cardiology Office Note:    Date:  05/19/2022   ID:  [name], DOB [birth year], MRN [id number]  PCP:  Dettinger, Fransisca Kaufmann, MD   [place] Providers Cardiologist:  Rozann Lesches, MD Electrophysiologist:  Thompson Grayer, MD     Referring MD: Dettinger, Fransisca Kaufmann, MD   CC: Here for ED follow-up  History of Present Illness:    [name] is a 88 y.o. female with a hx of the following:  Persistent A-fib A-flutter, s/p RFA in 2003 Hx of bilateral DVT HTN Mixed HLD Hx of breast cancer, s/p lumpectomy SSS, s/p PPM HFpEF  Patient is a 88 year old female with past medical history as mentioned above.  In June 2023 she was hospitalized with shortness of breath and weakness and was treated for suspected acute on chronic heart failure with preserved ejection fraction.  Echocardiogram revealed EF 60 to 65%, no RWMA, moderate LVH, mildly enlarged right ventricular size, right atrial size was severely dilated, aortic sclerosis without stenosis, no other significant valvular abnormalities. Followed by EP for history of pacemaker.   Last seen by Dr. Domenic Polite on January 17, 2022 and was doing well.  Weight was stable, however did note intermittent leg swelling.  Dr. Domenic Polite stated she could take an additional 20 mg of Lasix in the afternoon with 2 pound weight gain in 24 hours or with increasing swelling in her legs.  Was told to follow-up in 6 months.  In September 2023 she presented to [facility] ED with chest pain.  She also noted shortness of breath related to exertion and associated with orthopnea with mild chest tightness, also noted generalized fatigue, had intermittent cough that was productive with clear/white sputum.  BNP mildly elevated at 369, was given Lasix 40 mg IV with DuoNeb.  Symptoms greatly improved.  Troponin negative x 2.  COVID and flu negative.  CXR negative for anything acute.  Was treated for mild CHF exacerbation, was discharged home in stable  condition.  She presented back to [facility] ED on April 13, 2022 with chief complaint of chest pain.  Described as pressure, mid chest without radiation, denied diaphoresis or nausea.  Workup was overall unremarkable.  CXR was negative for anything acute.  Was given 2 mg of morphine and symptoms improved.  She was advised to increase Lasix to 60 mg daily x 3 days and to follow-up with primary doctor, due to the fact she had similar symptoms related to fluid before and did note a several pound weight gain around that time.  Was discharged in stable condition that day.  Presented back to [facility] ED later that month with chief complaint of chest pain.  On exam she had tenderness to palpation on right side of chest wall.  Troponins negative.  EKG unremarkable.  Was discharged home in stable condition later that day.  Today she presents for follow-up.  She states she is doing well. Denies any recurrent chest pain, shortness of breath, palpitations, syncope, presyncope, dizziness, orthopnea, PND, swelling, significant weight changes, acute bleeding, or claudication.  Denies any recent sick contacts.  Does admit to some fatigue at times. Wt is stable. Tolerating her medications well.  Denies any issues with her blood pressure.  Denies any other questions or concerns today.   Past Medical History:  Diagnosis Date   Arthritis    Atrial flutter [facility])    s/p RFA 2003   Breast cancer ([place])    1980's - lumpectomy only   Colon  polyps    Diverticulosis    DVT, lower extremity ([place]) 1998 and 1999   Bilateral   Essential hypertension    Gallstones    GERD (gastroesophageal reflux disease)    Glaucoma    IBS (irritable bowel syndrome)    Mixed hyperlipidemia    Osteoporosis    Persistent atrial fibrillation ([place])    Pneumonia 2011   Sick sinus syndrome (HCC)    PPM ([facility])   Urinary tract infection    Varicose veins     Past Surgical History:  Procedure Laterality Date   BIOPSY  07/04/2021    Procedure: BIOPSY;  Surgeon: Thornton Park, MD;  Location: [place];  Service: Gastroenterology;;   BREAST LUMPECTOMY  [phone]   Right breast    CARDIAC CATHETERIZATION  1999   Normal coronaries   Cataracts Bilateral    CHOLECYSTECTOMY     COLONOSCOPY W/ BIOPSIES AND POLYPECTOMY  03/2003, 05/2008, 02/20/2011   severe diverticulosis, tubulovillous adenoma polyp, internal and external hemorrhoids 2012: severe diverticulosis, 4 mm polyp, hemorrhoids   CYST REMOVAL TRUNK     under breast = on belly    DILATION AND CURETTAGE OF UTERUS     ESOPHAGOGASTRODUODENOSCOPY (EGD) WITH PROPOFOL N/A 07/04/2021   Procedure: ESOPHAGOGASTRODUODENOSCOPY (EGD) WITH PROPOFOL;  Surgeon: Thornton Park, MD;  Location: [place];  Service: Gastroenterology;  Laterality: N/A;   EYE SURGERY     cataracts - bilateral    Fractured wrist Left 2013   repair   INSERT / REPLACE / REMOVE PACEMAKER     KNEE ARTHROSCOPY WITH MEDIAL MENISECTOMY Right 10/21/2014   Procedure: RIGHT KNEE ARTHROSCOPY WITH MEDIAL MENISECTOM,lateral menisectomy,synovectomy suprapatellar pouch;  Surgeon: Latanya Maudlin, MD;  Location: [facility];  Service: Orthopedics;  Laterality: Right;   PACEMAKER GENERATOR CHANGE  06/18/12   [facility] DR RF, Dr Rayann Heman   PACEMAKER INSERTION  2003   PERMANENT PACEMAKER GENERATOR CHANGE N/A 06/18/2012   Procedure: PERMANENT PACEMAKER GENERATOR CHANGE;  Surgeon: Thompson Grayer, MD;  Location: [facility] CATH LAB;  Service: Cardiovascular;  Laterality: N/A;   PPM GENERATOR CHANGEOUT N/A 09/30/2021   Procedure: PPM GENERATOR CHANGEOUT;  Surgeon: Thompson Grayer, MD;  Location: [place] CV LAB;  Service: Cardiovascular;  Laterality: N/A;   TOTAL KNEE ARTHROPLASTY  12/20/2011   Procedure: TOTAL KNEE ARTHROPLASTY;  Surgeon: Tobi Bastos, MD;  Location: [facility];  Service: Orthopedics;  Laterality: Left;   TOTAL KNEE ARTHROPLASTY Right 02/10/2015   Procedure: TOTAL RIGHT KNEE ARTHROPLASTY;  Surgeon: Latanya Maudlin, MD;   Location: [facility];  Service: Orthopedics;  Laterality: Right;    Current Medications: Current Meds  Medication Sig   acetaminophen (TYLENOL) 500 MG tablet Take 1,000 mg by mouth every 6 (six) hours as needed for mild pain.   albuterol (VENTOLIN HFA) 108 (90 Base) MCG/ACT inhaler Inhale 1-2 puffs into the lungs every 6 (six) hours as needed for wheezing or shortness of breath.   amiodarone (PACERONE) 200 MG tablet TAKE ONE (1) TABLET EACH DAY (Patient taking differently: Take 200 mg by mouth daily.)   apixaban (ELIQUIS) 5 MG TABS tablet Take 1 tablet (5 mg total) by mouth 2 (two) times daily.   calcium-vitamin D (OSCAL WITH D) 500-200 MG-UNIT tablet Take 1 tablet by mouth daily with breakfast.    cyanocobalamin (VITAMIN B12) 1000 MCG/ML injection Inject 1 mL (1,000 mcg total) into the muscle every 30 (thirty) days.   furosemide (LASIX) 40 MG tablet Take 1 tablet (40 mg total) by mouth daily. May take  an extra 20 mg daily as needed for leg swelling or weight gain (Patient taking differently: Take 20-40 mg by mouth See admin instructions. Take 1 tablet by mouth once daily. May take an extra 20 mg daily as needed for leg swelling or weight gain)   latanoprost (XALATAN) 0.005 % ophthalmic solution Place 1 drop into both eyes at bedtime.   Multiple Vitamin (MULTIVITAMIN) tablet Take 1 tablet by mouth every morning.   nadolol (CORGARD) 40 MG tablet TAKE 1 AND 1/2 TABLETS DAILY   nitroGLYCERIN (NITROSTAT) 0.4 MG SL tablet DISSOLVE 1 TAB UNDER TOUNGE FOR CHEST PAIN. MAY REPEAT EVERY 5 MINUTES FOR 3 DOSES. IF NO RELIEF CALL 911 OR GO TO ER (Patient taking differently: Place 0.4 mg under the tongue every 5 (five) minutes as needed for chest pain.)   OVER THE COUNTER MEDICATION Take 1 tablet by mouth daily. calcium   pantoprazole (PROTONIX) 40 MG tablet TAKE ONE (1) TABLET BY MOUTH EVERY DAY (Patient taking differently: Take 40 mg by mouth daily.)   Wheat Dextrin (BENEFIBER) POWD Take 1 Dose by mouth daily as  needed (constipation).     Allergies:   Iodinated contrast media and Naproxen   Social History   Socioeconomic History   Marital status: Widowed    Spouse name: Not on file   Number of children: 5   Years of education: 80   Highest education level: 12th grade  Occupational History   Occupation: Retired    Comment: resturant / cafe   Tobacco Use   Smoking status: Never   Smokeless tobacco: Never  Vaping Use   Vaping Use: Never used  Substance and Sexual Activity   Alcohol use: No    Alcohol/week: 0.0 standard drinks of alcohol   Drug use: No   Sexual activity: Not on file  Other Topics Concern   Not on file  Social History Narrative   Widowed  - 5 sons = 1 lives local    Retired.   Never smoker no alcohol   Social Determinants of Health   Financial Resource Strain: Low Risk  (01/26/2022)   Overall Financial Resource Strain (CARDIA)    Difficulty of Paying Living Expenses: Not hard at all  Food Insecurity: No Food Insecurity (01/26/2022)   Hunger Vital Sign    Worried About Running Out of Food in the Last Year: Never true    Ran Out of Food in the Last Year: Never true  Transportation Needs: No Transportation Needs (01/26/2022)   PRAPARE - Hydrologist (Medical): No    Lack of Transportation (Non-Medical): No  Physical Activity: Insufficiently Active (01/26/2022)   Exercise Vital Sign    Days of Exercise per Week: 7 days    Minutes of Exercise per Session: 20 min  Stress: No Stress Concern Present (01/26/2022)   [place]    Feeling of Stress : Not at all  Social Connections: Moderately Integrated (01/26/2022)   Social Connection and Isolation Panel [NHANES]    Frequency of Communication with Friends and Family: More than three times a week    Frequency of Social Gatherings with Friends and Family: More than three times a week    Attends Religious Services: More than 4 times  per year    Active Member of [employer] or Organizations: Yes    Attends Archivist Meetings: More than 4 times per year    Marital Status: Widowed  Family History: The patient's family history includes Alzheimer's disease in her brother, mother, and sister; Atrial fibrillation in her brother; Colon cancer in her brother, maternal grandfather, and sister; Dementia in her brother; Diabetes in her brother; Heart attack in her paternal grandmother; Heart disease in her brother and paternal grandmother; Parkinson's disease in her brother; Prostate cancer in her son and son; Stomach cancer in her maternal grandmother; Stroke in her mother; Throat cancer in her father.  ROS:   Review of Systems  Constitutional:  Positive for malaise/fatigue. Negative for chills, diaphoresis, fever and weight loss.  HENT: Negative.    Eyes: Negative.   Respiratory: Negative.    Cardiovascular: Negative.   Gastrointestinal: Negative.   Genitourinary: Negative.   Musculoskeletal: Negative.   Skin: Negative.   Neurological: Negative.   Endo/Heme/Allergies: Negative.   Psychiatric/Behavioral: Negative.      Please see the history of present illness.    All other systems reviewed and are negative.  EKGs/Labs/Other Studies Reviewed:    The following studies were reviewed today:   EKG:  EKG is not ordered today.  EKG dated 05/11/22 revealed AV paced rhythm.    Limited echo on November 24, 2021:  1. Limited study with limited images   2. Left ventricular ejection fraction, by estimation, is 60 to 65%. Left  ventricular ejection fraction by 2D MOD biplane is 63.6 %. The left  ventricle has normal function. The left ventricle has no regional wall  motion abnormalities. There is moderate  left ventricular hypertrophy.   3. The right ventricular size is mildly enlarged. Tricuspid regurgitation  signal is inadequate for assessing PA pressure.   4. Right atrial size was severely dilated.   5. The aortic  valve is tricuspid. Aortic valve regurgitation is not  visualized. Aortic valve sclerosis/calcification is present, without any  evidence of aortic stenosis.   6. The inferior vena cava is dilated in size with <50% respiratory  variability, suggesting right atrial pressure of 15 mmHg.   Comparison(s): No significant change from prior study. 07/03/2021: LVEF  60-65%.  2D complete echo on July 03, 2021:  1. Left ventricular ejection fraction, by estimation, is 60 to 65%. The  left ventricle has normal function. The left ventricle has no regional  wall motion abnormalities. There is moderate left ventricular hypertrophy.  Left ventricular diastolic  parameters are indeterminate.   2. Right ventricular systolic function is mildly reduced. The right  ventricular size is mildly enlarged. Moderately increased right  ventricular wall thickness. There is moderately elevated pulmonary artery  systolic pressure. The estimated right  ventricular systolic pressure is 02.5 mmHg.   3. Left atrial size was moderately dilated.   4. Right atrial size was moderately dilated.   5. A small pericardial effusion is present.   6. The mitral valve is degenerative. Mild mitral valve regurgitation.   7. Tricuspid valve regurgitation is moderate.   8. The aortic valve is tricuspid. Aortic valve regurgitation is not  visualized. Mild to moderate aortic valve stenosis. Vmax 2.4 m/s, MG 12  mmHg, AVA 1.2 cm^2, DI 0.35   9. The inferior vena cava is dilated in size with <50% respiratory  variability, suggesting right atrial pressure of 15 mmHg.  Myoview on December 15, 2020: There was no ST segment deviation noted during stress. The study is normal. This is a low risk study. The left ventricular ejection fraction is normal (55-65%).   Normal resting and stress perfusion. No ischemia or infarction EF 60 % Baseline  ECG AV pacing    Recent Labs: 11/25/2021: Magnesium 1.9 03/01/2022: BNP 229.8 03/16/2022: ALT 17;  TSH 1.790 05/11/2022: BUN 19; Creatinine, Ser 0.92; Hemoglobin 9.8; Platelets 131; Potassium 3.8; Sodium 138  Recent Lipid Panel    Component Value Date/Time   CHOL 162 03/16/2022 1102   TRIG 76 03/16/2022 1102   HDL 57 03/16/2022 1102   CHOLHDL 2.8 03/16/2022 1102   LDLCALC 90 03/16/2022 1102     Risk Assessment/Calculations:    CHA2DS2-VASc Score = 5  This indicates a 7.2% annual risk of stroke. The patient's score is based upon: CHF History: 1 HTN History: 1 Diabetes History: 0 Stroke History: 0 Vascular Disease History: 0 Age Score: 2 Gender Score: 1    Physical Exam:    VS:  BP 124/82   Pulse 64   Ht '5\' 3"'$  (1.6 m)   Wt 156 lb (70.8 kg)   BMI 27.63 kg/m     Wt Readings from Last 3 Encounters:  05/19/22 156 lb (70.8 kg)  05/11/22 158 lb 11.7 oz (72 kg)  04/13/22 156 lb 15.5 oz (71.2 kg)     GEN: Thin, frail 88 year old female in no acute distress HEENT: Normal NECK: No JVD; Mild right carotid bruit and no carotid bruit on left  CARDIAC: S1/S2, RRR, no murmurs, rubs, gallops; 2+ peripheral pulses throughout, strong and equal bilaterally RESPIRATORY:  Clear and diminished to auscultation without rales, wheezing or rhonchi  MUSCULOSKELETAL:  No edema; Thoracic kyphosis noted, otherwise no deformity noted SKIN: Thin skin, warm and dry NEUROLOGIC:  Alert and oriented x 3 PSYCHIATRIC:  Normal affect   ASSESSMENT:    1. PAF (paroxysmal atrial fibrillation) ([place])   2. Atrial flutter, unspecified type ([place])   3. SSS (sick sinus syndrome) ([place])   4. Presence of cardiac pacemaker   5. Chronic heart failure with preserved ejection fraction (HCC)   6. Mixed hyperlipidemia   7. Right carotid bruit   8. History of DVT (deep vein thrombosis)   9. Other fatigue    PLAN:    In order of problems listed above:  PAF; A-flutter s/p RFA in 2003 Denies any tachycardia or palpitations.  CHA2DS2-VASc score is 5.  Currently on appropriate dosage of Eliquis.  Continue  Eliquis 5 mg twice daily.  Continue amiodarone.  Recent TSH and LFTs normal. Heart healthy diet and regular cardiovascular exercise encouraged.   2.  SSS, status post PPM Most recent remote device check revealed normal device function without any events or anything acute.  Continue to follow-up with EP.  3.  Chronic heart failure with preserved ejection fraction Limited echo in June 2022 revealed EF 60 to 65%, moderate LVH. Euvolemic and well compensated on exam. Low sodium diet, fluid restriction <2L, and daily weights encouraged. Educated to contact our office for weight gain of 2 lbs overnight or 5 lbs in one week.  Continue current medication regimen. Heart healthy diet and regular cardiovascular exercise encouraged.   4.  Mixed hyperlipidemia Labs from October 2023 revealed total cholesterol 162, HDL 57, LDL 90, and triglycerides 76.  Currently not on any lipid-lowering medications. Heart healthy diet and regular cardiovascular exercise encouraged.   5.  Right carotid bruit Noted on exam today.  Mild in nature.  No carotid bruit heard on the left carotid.  She is asymptomatic with this.  Discussed obtaining carotid Dopplers, she is agreeable to proceed.  Continue current medication regimen. Heart healthy diet and regular cardiovascular exercise encouraged.   6.  History of bilateral DVT Denies any leg swelling or claudication.  She is compliant with Eliquis.  Denies any bleeding issues.  Continue Eliquis 5 mg twice daily.  8. Fatigue Does admit to some fatigue at times.  Labs from recent ED visit revealed WBC 3.6, hemoglobin 9.8, hematocrit 30.9, and platelet count 131.  Will route these labs to patient's PCP.  May need to consider hematology/oncology referral in future.    9.  Disposition: Follow-up with Dr. Domenic Polite in February 2024 or sooner if anything changes.   Medication Adjustments/Labs and Tests Ordered: Current medicines are reviewed at length with the patient today.  Concerns  regarding medicines are outlined above.  Orders Placed This Encounter  Procedures   VAS US CAROTID   No orders of the defined types were placed in this encounter.   Patient Instructions  Medication Instructions:  Your physician recommends that you continue on your current medications as directed. Please refer to the Current Medication list given to you today.  Labwork: none  Testing/Procedures: Your physician has requested that you have a carotid duplex. This test is an ultrasound of the carotid arteries in your neck. It looks at blood flow through these arteries that supply the brain with blood. Allow one hour for this exam. There are no restrictions or special instructions.  Follow-Up: Your physician recommends that you schedule a follow-up appointment in: as planned  Any Other Special Instructions Will Be Listed Below (If Applicable).  If you need a refill on your cardiac medications before your next appointment, please call your pharmacy.   SignedFinis Bud, NP  05/19/2022 11:39 AM    [place]

## 2022-05-19 ENCOUNTER — Ambulatory Visit: Payer: Medicare Other | Attending: Nurse Practitioner | Admitting: Nurse Practitioner

## 2022-05-19 ENCOUNTER — Encounter: Payer: Self-pay | Admitting: Nurse Practitioner

## 2022-05-19 VITALS — BP 124/82 | HR 64 | Ht 63.0 in | Wt 156.0 lb

## 2022-05-19 DIAGNOSIS — Z950 Presence of cardiac pacemaker: Secondary | ICD-10-CM | POA: Diagnosis not present

## 2022-05-19 DIAGNOSIS — I4892 Unspecified atrial flutter: Secondary | ICD-10-CM

## 2022-05-19 DIAGNOSIS — I480 Paroxysmal atrial fibrillation: Secondary | ICD-10-CM | POA: Diagnosis not present

## 2022-05-19 DIAGNOSIS — R0989 Other specified symptoms and signs involving the circulatory and respiratory systems: Secondary | ICD-10-CM

## 2022-05-19 DIAGNOSIS — I495 Sick sinus syndrome: Secondary | ICD-10-CM

## 2022-05-19 DIAGNOSIS — R5383 Other fatigue: Secondary | ICD-10-CM

## 2022-05-19 DIAGNOSIS — Z86718 Personal history of other venous thrombosis and embolism: Secondary | ICD-10-CM | POA: Diagnosis not present

## 2022-05-19 DIAGNOSIS — E782 Mixed hyperlipidemia: Secondary | ICD-10-CM | POA: Diagnosis not present

## 2022-05-19 DIAGNOSIS — I5032 Chronic diastolic (congestive) heart failure: Secondary | ICD-10-CM

## 2022-05-19 NOTE — Patient Instructions (Addendum)
Medication Instructions:  Your physician recommends that you continue on your current medications as directed. Please refer to the Current Medication list given to you today.  Labwork: none  Testing/Procedures: Your physician has requested that you have a carotid duplex. This test is an ultrasound of the carotid arteries in your neck. It looks at blood flow through these arteries that supply the brain with blood. Allow one hour for this exam. There are no restrictions or special instructions.  Follow-Up: Your physician recommends that you schedule a follow-up appointment in: as planned  Any Other Special Instructions Will Be Listed Below (If Applicable).  If you need a refill on your cardiac medications before your next appointment, please call your pharmacy.

## 2022-05-22 ENCOUNTER — Other Ambulatory Visit: Payer: Self-pay | Admitting: Family Medicine

## 2022-05-22 DIAGNOSIS — I480 Paroxysmal atrial fibrillation: Secondary | ICD-10-CM

## 2022-05-22 DIAGNOSIS — K219 Gastro-esophageal reflux disease without esophagitis: Secondary | ICD-10-CM

## 2022-06-01 ENCOUNTER — Other Ambulatory Visit: Payer: Self-pay | Admitting: Nurse Practitioner

## 2022-06-01 DIAGNOSIS — I4892 Unspecified atrial flutter: Secondary | ICD-10-CM

## 2022-06-01 DIAGNOSIS — I5032 Chronic diastolic (congestive) heart failure: Secondary | ICD-10-CM

## 2022-06-01 DIAGNOSIS — Z86718 Personal history of other venous thrombosis and embolism: Secondary | ICD-10-CM

## 2022-06-01 DIAGNOSIS — I495 Sick sinus syndrome: Secondary | ICD-10-CM

## 2022-06-01 DIAGNOSIS — Z950 Presence of cardiac pacemaker: Secondary | ICD-10-CM

## 2022-06-01 DIAGNOSIS — E782 Mixed hyperlipidemia: Secondary | ICD-10-CM

## 2022-06-01 DIAGNOSIS — R5383 Other fatigue: Secondary | ICD-10-CM

## 2022-06-01 DIAGNOSIS — I480 Paroxysmal atrial fibrillation: Secondary | ICD-10-CM

## 2022-06-01 DIAGNOSIS — R0989 Other specified symptoms and signs involving the circulatory and respiratory systems: Secondary | ICD-10-CM

## 2022-06-08 ENCOUNTER — Ambulatory Visit: Payer: Medicare Other | Attending: Nurse Practitioner

## 2022-06-08 DIAGNOSIS — R0989 Other specified symptoms and signs involving the circulatory and respiratory systems: Secondary | ICD-10-CM

## 2022-06-19 ENCOUNTER — Other Ambulatory Visit: Payer: Self-pay

## 2022-06-27 ENCOUNTER — Other Ambulatory Visit: Payer: Self-pay | Admitting: Family Medicine

## 2022-07-03 ENCOUNTER — Ambulatory Visit: Payer: Medicare Other | Attending: Cardiovascular Disease

## 2022-07-03 DIAGNOSIS — I495 Sick sinus syndrome: Secondary | ICD-10-CM

## 2022-07-03 DIAGNOSIS — I480 Paroxysmal atrial fibrillation: Secondary | ICD-10-CM

## 2022-07-04 DIAGNOSIS — I87391 Chronic venous hypertension (idiopathic) with other complications of right lower extremity: Secondary | ICD-10-CM | POA: Diagnosis not present

## 2022-07-04 DIAGNOSIS — L299 Pruritus, unspecified: Secondary | ICD-10-CM | POA: Diagnosis not present

## 2022-07-04 DIAGNOSIS — R600 Localized edema: Secondary | ICD-10-CM | POA: Diagnosis not present

## 2022-07-04 DIAGNOSIS — I872 Venous insufficiency (chronic) (peripheral): Secondary | ICD-10-CM | POA: Diagnosis not present

## 2022-07-04 LAB — CUP PACEART REMOTE DEVICE CHECK
Battery Remaining Longevity: 103 mo
Battery Remaining Percentage: 95 %
Battery Voltage: 3.01 V
Brady Statistic AP VP Percent: 99 %
Brady Statistic AP VS Percent: 1 %
Brady Statistic AS VP Percent: 1 %
Brady Statistic AS VS Percent: 0 %
Brady Statistic RA Percent Paced: 99 %
Brady Statistic RV Percent Paced: 99 %
Date Time Interrogation Session: 20240122033258
Implantable Lead Connection Status: 753985
Implantable Lead Connection Status: 753985
Implantable Lead Implant Date: 20031028
Implantable Lead Implant Date: 20031028
Implantable Lead Location: 753859
Implantable Lead Location: 753860
Implantable Pulse Generator Implant Date: 20230421
Lead Channel Impedance Value: 360 Ohm
Lead Channel Impedance Value: 460 Ohm
Lead Channel Pacing Threshold Amplitude: 0.5 V
Lead Channel Pacing Threshold Amplitude: 0.5 V
Lead Channel Pacing Threshold Pulse Width: 0.5 ms
Lead Channel Pacing Threshold Pulse Width: 0.5 ms
Lead Channel Sensing Intrinsic Amplitude: 12 mV
Lead Channel Setting Pacing Amplitude: 0.75 V
Lead Channel Setting Pacing Amplitude: 2 V
Lead Channel Setting Pacing Pulse Width: 0.5 ms
Lead Channel Setting Sensing Sensitivity: 4 mV
Pulse Gen Model: 2272
Pulse Gen Serial Number: 8074688

## 2022-07-12 DIAGNOSIS — I83892 Varicose veins of left lower extremities with other complications: Secondary | ICD-10-CM | POA: Diagnosis not present

## 2022-07-12 DIAGNOSIS — R600 Localized edema: Secondary | ICD-10-CM | POA: Diagnosis not present

## 2022-07-12 DIAGNOSIS — I872 Venous insufficiency (chronic) (peripheral): Secondary | ICD-10-CM | POA: Diagnosis not present

## 2022-07-25 ENCOUNTER — Other Ambulatory Visit: Payer: Self-pay | Admitting: Family Medicine

## 2022-07-25 DIAGNOSIS — I480 Paroxysmal atrial fibrillation: Secondary | ICD-10-CM

## 2022-07-25 DIAGNOSIS — K219 Gastro-esophageal reflux disease without esophagitis: Secondary | ICD-10-CM

## 2022-07-25 DIAGNOSIS — I10 Essential (primary) hypertension: Secondary | ICD-10-CM

## 2022-07-26 ENCOUNTER — Ambulatory Visit (INDEPENDENT_AMBULATORY_CARE_PROVIDER_SITE_OTHER): Payer: Medicare Other | Admitting: Family Medicine

## 2022-07-26 ENCOUNTER — Encounter: Payer: Self-pay | Admitting: Family Medicine

## 2022-07-26 VITALS — BP 129/75 | HR 84 | Temp 97.5°F | Ht 63.0 in | Wt 155.5 lb

## 2022-07-26 DIAGNOSIS — B372 Candidiasis of skin and nail: Secondary | ICD-10-CM | POA: Diagnosis not present

## 2022-07-26 MED ORDER — NYSTATIN 100000 UNIT/GM EX CREA: 1.0000 | TOPICAL_CREAM | Freq: Two times a day (BID) | CUTANEOUS | 0 refills | Status: DC

## 2022-07-26 NOTE — Progress Notes (Signed)
   Acute Office Visit  Subjective:     Patient ID: [name], female    DOB: 1928-11-18, 89 y.o.   MRN: [id number]  Chief Complaint  Patient presents with   Rash    HPI Patient is in today for a rash on her right breast. She reports a reddened area on her lower right breast that has been present for years. This area has become itchy with burning pain and increased redness for the last 5 days or do. Denies lump, mass, nipple retraction, or nipple discharge. She has had a negative mammo within the last year.    ROS As per HPI.       Objective:    BP 129/75   Pulse 84   Temp (!) 97.5 F (36.4 C) (Temporal)   Ht '5\' 3"'$  (1.6 m)   Wt 155 lb 8 oz (70.5 kg)   SpO2 97%   BMI 27.55 kg/m    Physical Exam Vitals and nursing note reviewed.  Constitutional:      General: She is not in acute distress.    Appearance: She is not ill-appearing, toxic-appearing or diaphoretic.  Chest:  Breasts:    Breasts are symmetrical.     Right: No swelling, bleeding, inverted nipple, mass, nipple discharge or tenderness.     Comments: Yeast dermatitis present to skin fold under right breast and right lower breast.  Skin:    General: Skin is warm and dry.  Neurological:     Mental Status: She is alert and oriented to person, place, and time. Mental status is at baseline.  Psychiatric:        Mood and Affect: Mood normal.        Behavior: Behavior normal.     No results found for any visits on 07/26/22.      Assessment & Plan:   [place] was seen today for rash.  Diagnoses and all orders for this visit:  Yeast dermatitis Nystatin as below. Return to office for new or worsening symptoms, or if symptoms persist.  -     nystatin cream (MYCOSTATIN); Apply 1 Application topically 2 (two) times daily.  The patient indicates understanding of these issues and agrees with the plan.   Gwenlyn Perking, FNP

## 2022-07-29 ENCOUNTER — Other Ambulatory Visit: Payer: Self-pay | Admitting: Family Medicine

## 2022-07-29 DIAGNOSIS — I10 Essential (primary) hypertension: Secondary | ICD-10-CM

## 2022-07-29 DIAGNOSIS — K219 Gastro-esophageal reflux disease without esophagitis: Secondary | ICD-10-CM

## 2022-07-29 DIAGNOSIS — I480 Paroxysmal atrial fibrillation: Secondary | ICD-10-CM

## 2022-08-08 ENCOUNTER — Encounter: Payer: Self-pay | Admitting: Cardiology

## 2022-08-08 ENCOUNTER — Ambulatory Visit: Payer: Medicare Other | Attending: Cardiology | Admitting: Cardiology

## 2022-08-08 VITALS — BP 110/70 | HR 60 | Ht 63.0 in | Wt 156.4 lb

## 2022-08-08 DIAGNOSIS — Z79899 Other long term (current) drug therapy: Secondary | ICD-10-CM | POA: Diagnosis not present

## 2022-08-08 DIAGNOSIS — E782 Mixed hyperlipidemia: Secondary | ICD-10-CM | POA: Diagnosis not present

## 2022-08-08 DIAGNOSIS — I5032 Chronic diastolic (congestive) heart failure: Secondary | ICD-10-CM | POA: Diagnosis not present

## 2022-08-08 DIAGNOSIS — I480 Paroxysmal atrial fibrillation: Secondary | ICD-10-CM | POA: Diagnosis not present

## 2022-08-08 DIAGNOSIS — I6523 Occlusion and stenosis of bilateral carotid arteries: Secondary | ICD-10-CM

## 2022-08-08 MED ORDER — EZETIMIBE 10 MG PO TABS: 10.0000 mg | ORAL_TABLET | Freq: Every day | ORAL | 3 refills | Status: DC

## 2022-08-08 NOTE — Addendum Note (Signed)
Addended by: Merlene Laughter on: 08/08/2022 02:56 PM   Modules accepted: Orders

## 2022-08-08 NOTE — Progress Notes (Signed)
Cardiology Office Note  Date: 08/08/2022   ID: [name], DOB 1929-01-04, MRN [id number]  History of Present Illness: [name] is a 89 y.o. female last seen in December 2023 by Ms. Arlington Calix NP, I reviewed the note.  She is here today with her son for a follow-up visit.  No significant change in weight or increasing dyspnea on exertion beyond NYHA class II.  She does feel palpitations intermittently as before.  No exertional chest pain, no dizziness or sudden syncope.  St. Jude pacemaker in place with follow-up by Dr. Myles Gip.  Device interrogation in January indicated normal function.  We did go over her medications today.  She has a history of statin intolerance reporting myalgias, most recently on Crestor.  Her LDL in November 2023 was 90.  We discussed a trial of Zetia in light of carotid artery disease.  She reports stable dose of Lasix, no nitroglycerin use.  Physical Exam: VS:  BP 110/70   Pulse 60   Ht '5\' 3"'$  (1.6 m)   Wt 156 lb 6.4 oz (70.9 kg)   BMI 27.71 kg/m , BMI Body mass index is 27.71 kg/m.  Wt Readings from Last 3 Encounters:  08/08/22 156 lb 6.4 oz (70.9 kg)  07/26/22 155 lb 8 oz (70.5 kg)  05/19/22 156 lb (70.8 kg)    General: Patient appears comfortable at rest. HEENT: Conjunctiva and lids normal. Lungs: Clear to auscultation, nonlabored breathing at rest. Cardiac: RRR without gallop. Extremities: No pitting edema.  ECG:  An ECG dated 05/11/2022 was personally reviewed today and demonstrated:  A dual-chamber paced rhythm.  Labwork: 11/25/2021: Magnesium 1.9 03/01/2022: BNP 229.8 03/16/2022: ALT 17; AST 23; TSH 1.790 05/11/2022: BUN 19; Creatinine, Ser 0.92; Hemoglobin 9.8; Platelets 131; Potassium 3.8; Sodium 138     Component Value Date/Time   CHOL 162 03/16/2022 1102   TRIG 76 03/16/2022 1102   HDL 57 03/16/2022 1102   CHOLHDL 2.8 03/16/2022 1102   LDLCALC 90 03/16/2022 1102   Other Studies Reviewed Today:  Carotid Dopplers  06/08/2022: Summary:  Right Carotid: Velocities in the right ICA are consistent with a 1-39%  stenosis.   Left Carotid: Velocities in the left ICA are consistent with a 1-39%  stenosis.   Vertebrals: Bilateral vertebral arteries demonstrate antegrade flow.  Subclavians: Normal flow hemodynamics were seen in bilateral subclavian               arteries.   Assessment and Plan:  1.  Paroxysmal atrial fibrillation with CHA2DS2-VASc score of 5.  She continues on Eliquis for stroke prophylaxis and amiodarone for rhythm suppression.  TSH and LFTs normal in October 2023.  She is chronically anemic, hemoglobin stable at 9.8 in November 2023, no spontaneous bleeding problems noted.  She reports stable palpitations overall, heart rate is regular today.  No changes were made.  2.  Sick sinus syndrome with St. Jude pacemaker in place, device interrogation normal in January.  She continues to follow with Dr. Myles Gip.  3.  HFpEF, LVEF 60 to 65% by echocardiogram in June 2023.  She continues on Lasix.  Have not pursued SGLT2 inhibitor at this point, concerned about increased risk for UTI given history.  Her weight is stable, no change in Lasix dosing.  Last creatinine was 0.92 with normal potassium.  4.  Asymptomatic, mild carotid artery disease by carotid Dopplers in December 2023.  She is not on aspirin given use of Eliquis.  Start Zetia 10 mg daily with  most recent LDL 90.  Recheck FLP for next visit.  Disposition:  Follow up  6 months.  Signed, Satira Sark, M.D., F.A.C.C.

## 2022-08-08 NOTE — Patient Instructions (Addendum)
Medication Instructions:  Your physician has recommended you make the following change in your medication:  Start zetia 10 mg daily Continue other medications the same  Labwork: Your physician recommends that you return for a FASTING lipid profile: in 6 months just before your next visit. Please do not eat or drink for at least 8 hours when you have this done. You may take your medications that morning with a sip of water. Can be done at [employer] at your family doctor's office  Testing/Procedures: none  Follow-Up: Your physician recommends that you schedule a follow-up appointment in: 6 months  Any Other Special Instructions Will Be Listed Below (If Applicable).  If you need a refill on your cardiac medications before your next appointment, please call your pharmacy.

## 2022-08-17 NOTE — Progress Notes (Signed)
Remote pacemaker transmission.   

## 2022-08-22 ENCOUNTER — Other Ambulatory Visit: Payer: Self-pay | Admitting: Cardiology

## 2022-08-24 ENCOUNTER — Ambulatory Visit (INDEPENDENT_AMBULATORY_CARE_PROVIDER_SITE_OTHER): Payer: Medicare Other

## 2022-08-24 ENCOUNTER — Ambulatory Visit (INDEPENDENT_AMBULATORY_CARE_PROVIDER_SITE_OTHER): Payer: Medicare Other | Admitting: Family

## 2022-08-24 ENCOUNTER — Encounter: Payer: Self-pay | Admitting: Family

## 2022-08-24 VITALS — BP 132/75 | HR 61 | Temp 98.1°F | Ht 63.0 in | Wt 156.8 lb

## 2022-08-24 DIAGNOSIS — R051 Acute cough: Secondary | ICD-10-CM

## 2022-08-24 DIAGNOSIS — M546 Pain in thoracic spine: Secondary | ICD-10-CM | POA: Diagnosis not present

## 2022-08-24 DIAGNOSIS — R059 Cough, unspecified: Secondary | ICD-10-CM | POA: Diagnosis not present

## 2022-08-24 LAB — URINALYSIS, COMPLETE
Bilirubin, UA: NEGATIVE
Glucose, UA: NEGATIVE
Ketones, UA: NEGATIVE
Leukocytes,UA: NEGATIVE
Nitrite, UA: NEGATIVE
Protein,UA: NEGATIVE
RBC, UA: NEGATIVE
Specific Gravity, UA: 1.015 (ref 1.005–1.030)
Urobilinogen, Ur: 1 mg/dL (ref 0.2–1.0)
pH, UA: 6 (ref 5.0–7.5)

## 2022-08-24 LAB — MICROSCOPIC EXAMINATION
RBC, Urine: NONE SEEN /HPF (ref 0–2)
Renal Epithel, UA: NONE SEEN /HPF

## 2022-08-24 MED ORDER — BACLOFEN 10 MG PO TABS: 5.0000 mg | ORAL_TABLET | Freq: Three times a day (TID) | ORAL | 0 refills | Status: DC

## 2022-08-24 NOTE — Patient Instructions (Signed)
Thoracic Strain A thoracic strain is an injury to the muscles or tendons that attach to the upper part of your back behind your chest. Tendons are tissues that connect muscle to bone. This injury is sometimes called a mid-back strain. It happens when a muscle is stretched too far or overloaded. Thoracic strains can range from mild to severe. Mild strains may involve stretching a muscle or tendon without tearing it. These injuries may heal in 1-2 weeks. More severe strains involve tearing of muscle fibers or tendons. These will cause more pain and may take 6-8 weeks to heal. What are the causes? This condition may be caused by: Trauma, such as a fall or a hit to the body. Twisting or overstretching the back. This may result from doing activities that take a lot of energy, such as lifting heavy objects. In some cases, the cause may not be known. What increases the risk? This injury is more common in: Athletes. People with obesity. What are the signs or symptoms? The main symptom of this condition is pain in the middle back, especially with movement. Other symptoms include: Stiffness or limited range of motion. Sudden muscle tightening (spasms). How is this diagnosed? This condition may be diagnosed based on: Your symptoms. Your medical history. A physical exam. Imaging tests, such as X-rays, a CT scan, or an MRI. How is this treated? This condition may be treated with: Resting the injured area. Applying heat and cold to the injured area. Over-the-counter medicines for pain and inflammation, such as NSAIDs. Prescription pain medicine or muscle relaxants. These may be needed for a short time. Doing exercises to improve movement and strength in your back (physical therapy). Treatments applied to the painful area, such as: Electrical stimulation. Stimulating the muscle with small needles (dry needling). Injections of medicine (trigger point injections). Follow these instructions at  home: Managing pain, stiffness, and swelling     If told, put ice on the injured area. Put ice in a plastic bag. Place a towel between your skin and the bag. Leave the ice on for 20 minutes, 2-3 times a day. If told, apply heat to the affected area as often as told by your health care provider. Use the heat source that your provider recommends, such as a moist heat pack or a heating pad. Place a towel between your skin and the heat source. Leave the heat on for 20-30 minutes. If your skin turns bright red, remove the ice or heat right away to prevent skin damage. The risk of damage is higher if you cannot feel pain, heat, or cold. Activity Rest and return to your normal activities as told by your provider. Ask your provider what activities are safe for you. Do exercises as told by your provider. Medicines Take over-the-counter and prescription medicines only as told by your provider. Ask your provider if the medicine prescribed to you: Requires you to avoid driving or using machinery. Can cause constipation. You may need to take these actions to prevent or treat constipation: Drink enough fluid to keep your pee (urine) pale yellow. Take over-the-counter or prescription medicines. Eat foods that are high in fiber, such as beans, whole grains, and fresh fruits and vegetables. Limit foods that are high in fat and processed sugars, such as fried or sweet foods. General instructions Do not use any products that contain nicotine or tobacco. These products include cigarettes, chewing tobacco, and vaping devices, such as e-cigarettes. If you need help quitting, ask your provider. Keep all follow-up  visits. Your provider will monitor your injury and activity. How is this prevented? To prevent a future mid-back injury: Always warm up before physical activity or sports. Cool down and stretch after being active. Use correct form when playing sports and lifting heavy objects. Bend your knees  before you lift heavy objects. Use good posture when sitting and standing. Stay physically fit and maintain a healthy weight. Do at least 150 minutes of moderate-intensity exercise each week, such as brisk walking or water aerobics. Do strength exercises at least 2 times each week. Contact a health care provider if: Your pain is not helped by medicine. Your pain or stiffness is getting worse. You develop pain or stiffness in your neck or lower back. Get help right away if: You have shortness of breath. You have chest pain. You have numbness, tingling, or weakness in your legs. You are not able to control when you pee (urinary incontinence). These symptoms may be an emergency. Get help right away. Call 911. Do not wait to see if the symptoms will go away. Do not drive yourself to the hospital. This information is not intended to replace advice given to you by your health care provider. Make sure you discuss any questions you have with your health care provider. Document Revised: 01/16/2022 Document Reviewed: 01/16/2022 Elsevier Patient Education  [place].

## 2022-08-24 NOTE — Progress Notes (Signed)
Subjective:    Patient ID: [name], female    DOB: [birth year], 89 y.o.   MRN: [id number]  Chief Complaint  Patient presents with   Back Pain   PT presents to the office today with left shoulder pain, left rib pain, and left hip pain she noticed it last Friday.  Back Pain This is a new problem. The problem occurs intermittently. The problem has been waxing and waning since onset. The pain is present in the thoracic spine and lumbar spine. The quality of the pain is described as aching. The pain does not radiate. Pain scale: 0 right now, but then can be a 10. The pain is mild. Pertinent negatives include no bladder incontinence, bowel incontinence, chest pain, dysuria, fever, leg pain, tingling, weakness or weight loss. Risk factors include obesity. She has tried bed rest (tylenol) for the symptoms. The treatment provided mild relief.  Cough This is a new problem. The current episode started 1 to 4 weeks ago. The problem has been waxing and waning. The problem occurs every few minutes. The cough is Non-productive. Pertinent negatives include no chest pain, chills, ear congestion, ear pain, fever or weight loss.      Review of Systems  Constitutional:  Negative for chills, fever and weight loss.  HENT:  Negative for ear pain.   Respiratory:  Positive for cough.   Cardiovascular:  Negative for chest pain.  Gastrointestinal:  Negative for bowel incontinence.  Genitourinary:  Negative for bladder incontinence and dysuria.  Musculoskeletal:  Positive for back pain.  Neurological:  Negative for tingling and weakness.  All other systems reviewed and are negative.      Objective:   Physical Exam Vitals reviewed.  Constitutional:      General: She is not in acute distress.    Appearance: She is well-developed.  HENT:     Head: Normocephalic and atraumatic.     Right Ear: Tympanic membrane normal.     Left Ear: Tympanic membrane normal.  Eyes:     Pupils: Pupils are equal,  round, and reactive to light.  Neck:     Thyroid: No thyromegaly.  Cardiovascular:     Rate and Rhythm: Normal rate and regular rhythm.     Heart sounds: Murmur heard.  Pulmonary:     Effort: Pulmonary effort is normal. No respiratory distress.     Breath sounds: Normal breath sounds. No wheezing.     Comments: Dry cough  Abdominal:     General: Bowel sounds are normal. There is no distension.     Palpations: Abdomen is soft.     Tenderness: There is no abdominal tenderness.  Musculoskeletal:        General: No tenderness. Normal range of motion.     Cervical back: Normal range of motion and neck supple.     Comments: Full ROM of back  Skin:    General: Skin is warm and dry.  Neurological:     Mental Status: She is alert and oriented to person, place, and time.     Cranial Nerves: No cranial nerve deficit.     Deep Tendon Reflexes: Reflexes are normal and symmetric.  Psychiatric:        Behavior: Behavior normal.        Thought Content: Thought content normal.        Judgment: Judgment normal.       BP 132/75   Pulse 61   Temp 98.1 F (36.7 C) (Temporal)  Ht '5\' 3"'$  (1.6 m)   Wt 156 lb 12.8 oz (71.1 kg)   SpO2 98%   BMI 27.78 kg/m      Assessment & Plan:  [name] comes in today with chief complaint of Back Pain   Diagnosis and orders addressed:  1. Acute cough - DG Chest 2 View  2. Acute left-sided thoracic back pain Rest ROM exercises  Possible shingles, but given no rash after 7 days unlikely  Can not take NSAIDs because of Eliquis  Tylenol as needed Pt will take .25 mg of baclofen, sedation precautions discussed  Follow up with PCP if symptoms continue  - baclofen (LIORESAL) 10 MG tablet; Take 0.5 tablets (5 mg total) by mouth 3 (three) times daily.  Dispense: 10 each; Refill: 0 - Urinalysis, Complete    Evelina Dun, FNP

## 2022-08-28 ENCOUNTER — Encounter: Payer: Self-pay | Admitting: Family Medicine

## 2022-08-29 NOTE — Telephone Encounter (Signed)
Daughter in law is calling checking Xray results. Patient is not improving and is in severe pain and needs to know what to do.

## 2022-08-31 ENCOUNTER — Emergency Department (HOSPITAL_COMMUNITY)
Admission: EM | Admit: 2022-08-31 | Discharge: 2022-08-31 | Disposition: A | Discharge status: Home / Self Care | Payer: Medicare Other | Attending: Emergency Medicine | Admitting: Emergency Medicine

## 2022-08-31 ENCOUNTER — Encounter (HOSPITAL_COMMUNITY): Payer: Self-pay

## 2022-08-31 ENCOUNTER — Other Ambulatory Visit: Payer: Self-pay

## 2022-08-31 ENCOUNTER — Emergency Department (HOSPITAL_COMMUNITY): Payer: Medicare Other

## 2022-08-31 DIAGNOSIS — R091 Pleurisy: Secondary | ICD-10-CM | POA: Diagnosis not present

## 2022-08-31 DIAGNOSIS — M546 Pain in thoracic spine: Secondary | ICD-10-CM | POA: Diagnosis not present

## 2022-08-31 DIAGNOSIS — Z7901 Long term (current) use of anticoagulants: Secondary | ICD-10-CM | POA: Diagnosis not present

## 2022-08-31 DIAGNOSIS — R0600 Dyspnea, unspecified: Secondary | ICD-10-CM | POA: Diagnosis not present

## 2022-08-31 DIAGNOSIS — I4891 Unspecified atrial fibrillation: Secondary | ICD-10-CM | POA: Diagnosis not present

## 2022-08-31 DIAGNOSIS — R918 Other nonspecific abnormal finding of lung field: Secondary | ICD-10-CM | POA: Diagnosis not present

## 2022-08-31 LAB — COMPREHENSIVE METABOLIC PANEL
ALT: 17 U/L (ref 0–44)
AST: 23 U/L (ref 15–41)
Albumin: 4.1 g/dL (ref 3.5–5.0)
Alkaline Phosphatase: 55 U/L (ref 38–126)
Anion gap: 9 (ref 5–15)
BUN: 25 mg/dL — ABNORMAL HIGH (ref 8–23)
CO2: 27 mmol/L (ref 22–32)
Calcium: 9.2 mg/dL (ref 8.9–10.3)
Chloride: 99 mmol/L (ref 98–111)
Creatinine, Ser: 0.97 mg/dL (ref 0.44–1.00)
GFR, Estimated: 55 mL/min — ABNORMAL LOW (ref >60)
Glucose, Bld: 108 mg/dL — ABNORMAL HIGH (ref 70–99)
Potassium: 4.1 mmol/L (ref 3.5–5.1)
Sodium: 135 mmol/L (ref 135–145)
Total Bilirubin: 0.7 mg/dL (ref 0.3–1.2)
Total Protein: 7 g/dL (ref 6.5–8.1)

## 2022-08-31 LAB — CBC WITH DIFFERENTIAL/PLATELET
Abs Immature Granulocytes: 0.01 10*3/uL (ref 0.00–0.07)
Basophils Absolute: 0 10*3/uL (ref 0.0–0.1)
Basophils Relative: 1 %
Eosinophils Absolute: 0.1 10*3/uL (ref 0.0–0.5)
Eosinophils Relative: 2 %
HCT: 28.6 % — ABNORMAL LOW (ref 36.0–46.0)
Hemoglobin: 8.9 g/dL — ABNORMAL LOW (ref 12.0–15.0)
Immature Granulocytes: 0 %
Lymphocytes Relative: 32 %
Lymphs Abs: 1.4 10*3/uL (ref 0.7–4.0)
MCH: 27.9 pg (ref 26.0–34.0)
MCHC: 31.1 g/dL (ref 30.0–36.0)
MCV: 89.7 fL (ref 80.0–100.0)
Monocytes Absolute: 0.6 10*3/uL (ref 0.1–1.0)
Monocytes Relative: 14 %
Neutro Abs: 2.3 10*3/uL (ref 1.7–7.7)
Neutrophils Relative %: 51 %
Platelets: 133 10*3/uL — ABNORMAL LOW (ref 150–400)
RBC: 3.19 MIL/uL — ABNORMAL LOW (ref 3.87–5.11)
RDW: 17.2 % — ABNORMAL HIGH (ref 11.5–15.5)
WBC: 4.5 10*3/uL (ref 4.0–10.5)
nRBC: 0 % (ref 0.0–0.2)

## 2022-08-31 LAB — BRAIN NATRIURETIC PEPTIDE: B Natriuretic Peptide: 544 pg/mL — ABNORMAL HIGH (ref 0.0–100.0)

## 2022-08-31 MED ORDER — DEXAMETHASONE SODIUM PHOSPHATE 10 MG/ML IJ SOLN
10.0000 mg | Freq: Once | INTRAMUSCULAR | Status: AC
  Administered 2022-08-31: 10 mg via INTRAVENOUS
  Filled 2022-08-31: qty 1

## 2022-08-31 MED ORDER — PREDNISONE 20 MG PO TABS: 40.0000 mg | ORAL_TABLET | Freq: Every day | ORAL | 0 refills | Status: DC

## 2022-08-31 NOTE — ED Triage Notes (Signed)
Back pain x2 weeks. Upper back pain, states went to orthopedic this morning and was given a referral to r/o pleurisy . Denies  chest pain. States has SOB intermittently over the last two weeks.

## 2022-08-31 NOTE — ED Notes (Signed)
Pt placed on full monitor. Call light within reach and no further requests made.

## 2022-08-31 NOTE — ED Provider Notes (Signed)
[place] EMERGENCY DEPARTMENT AT [facility] Provider Note   CSN: [id number] Arrival date & time: 08/31/22  1253     History  Chief Complaint  Patient presents with   Back Pain    Back pain x2 weeks. Upper back, states went to orthopedic this morning and was given a referral to r/o pleurisy . Denies SOB, chest pain at this time    [name] is a 89 y.o. female.  HPI Patient presents with 2 family members who assists with the history.  She presents due to ongoing left posterior superior thoracic pain and dyspnea. Onset was about 2 weeks ago, no clear precipitant.  She has been seen and evaluated primary care, and today went to orthopedics on referral.  From there she was sent here due to concern for the dyspnea component.  No fall, trauma, no interval relief with anything including OTC medication.  No fever, currently no dyspnea at all.  She is on a blood thinning medication, which she is taking reliably.    Home Medications Prior to Admission medications   Medication Sig Start Date End Date Taking? Authorizing Provider  predniSONE (DELTASONE) 20 MG tablet Take 2 tablets (40 mg total) by mouth daily with breakfast. For the next four days 08/31/22  Yes Carmin Muskrat, MD  acetaminophen (TYLENOL) 500 MG tablet Take 1,000 mg by mouth every 6 (six) hours as needed for mild pain.    [provider]  albuterol (VENTOLIN HFA) 108 (90 Base) MCG/ACT inhaler Inhale 1-2 puffs into the lungs every 6 (six) hours as needed for wheezing or shortness of breath. 02/25/22   Jeanell Sparrow, DO  amiodarone (PACERONE) 200 MG tablet TAKE ONE (1) TABLET EACH DAY 07/31/22   Dettinger, Fransisca Kaufmann, MD  baclofen (LIORESAL) 10 MG tablet Take 0.5 tablets (5 mg total) by mouth 3 (three) times daily. 08/24/22   Sharion Balloon, FNP  calcium-vitamin D (OSCAL WITH D) 500-200 MG-UNIT tablet Take 1 tablet by mouth daily with breakfast.     [provider]  cyanocobalamin (VITAMIN  B12) 1000 MCG/ML injection Inject 1 mL (1,000 mcg total) into the muscle every 30 (thirty) days. 04/10/22   Dettinger, Fransisca Kaufmann, MD  ELIQUIS 5 MG TABS tablet TAKE ONE TABLET BY MOUTH TWICE DAILY 07/26/22   Dettinger, Fransisca Kaufmann, MD  ezetimibe (ZETIA) 10 MG tablet Take 1 tablet (10 mg total) by mouth daily. 08/08/22   Satira Sark, MD  furosemide (LASIX) 40 MG tablet TAKE 1 TABLET BY MOUTH DAILY. MAY TAKE AN EXTRA 1/2 TABLET DAILY AS NEEDED FOR LEG SWELLING OR WEIGHT GAIN 08/22/22   Satira Sark, MD  latanoprost (XALATAN) 0.005 % ophthalmic solution Place 1 drop into both eyes at bedtime.    [provider]  Multiple Vitamin (MULTIVITAMIN) tablet Take 1 tablet by mouth every morning.    [provider]  nadolol (CORGARD) 40 MG tablet TAKE 1 AND 1/2 TABLETS DAILY 06/27/22   Dettinger, Fransisca Kaufmann, MD  nitroGLYCERIN (NITROSTAT) 0.4 MG SL tablet DISSOLVE 1 TAB UNDER TOUNGE FOR CHEST PAIN. MAY REPEAT EVERY 5 MINUTES FOR 3 DOSES. IF NO RELIEF CALL 911 OR GO TO ER Patient taking differently: Place 0.4 mg under the tongue every 5 (five) minutes as needed for chest pain. 09/10/20   Satira Sark, MD  nystatin cream (MYCOSTATIN) Apply 1 Application topically 2 (two) times daily. 07/26/22   Gwenlyn Perking, FNP  OVER THE COUNTER MEDICATION Take 1 tablet by  mouth daily. calcium    [provider]  pantoprazole (PROTONIX) 40 MG tablet TAKE ONE (1) TABLET BY MOUTH EVERY DAY 07/31/22   Dettinger, Fransisca Kaufmann, MD  Wheat Dextrin (BENEFIBER) POWD Take 1 Dose by mouth daily as needed (constipation).    [provider]      Allergies    Iodinated contrast media and Naproxen    Review of Systems   Review of Systems  All other systems reviewed and are negative.   Physical Exam Updated Vital Signs BP 97/69   Pulse (!) 59   Temp 97.7 F (36.5 C)   Resp 19   Ht 5\' 3"  (1.6 m)   Wt 71.1 kg   SpO2 96%   BMI 27.77 kg/m  Physical Exam Vitals and nursing note reviewed.   Constitutional:      General: She is not in acute distress.    Appearance: She is well-developed.  HENT:     Head: Normocephalic and atraumatic.  Eyes:     Conjunctiva/sclera: Conjunctivae normal.  Cardiovascular:     Rate and Rhythm: Normal rate and regular rhythm.  Pulmonary:     Effort: Pulmonary effort is normal. No respiratory distress.     Breath sounds: Normal breath sounds. No stridor.  Abdominal:     General: There is no distension.  Skin:    General: Skin is warm and dry.  Neurological:     Mental Status: She is alert and oriented to person, place, and time.     Cranial Nerves: No cranial nerve deficit.  Psychiatric:        Mood and Affect: Mood normal.     ED Results / Procedures / Treatments   Labs (all labs ordered are listed, but only abnormal results are displayed) Labs Reviewed  COMPREHENSIVE METABOLIC PANEL - Abnormal; Notable for the following components:      Result Value   Glucose, Bld 108 (*)    BUN 25 (*)    GFR, Estimated 55 (*)    All other components within normal limits  CBC WITH DIFFERENTIAL/PLATELET - Abnormal; Notable for the following components:   RBC 3.19 (*)    Hemoglobin 8.9 (*)    HCT 28.6 (*)    RDW 17.2 (*)    Platelets 133 (*)    All other components within normal limits  BRAIN NATRIURETIC PEPTIDE    EKG EKG Interpretation  Date/Time:  Thursday August 31 2022 13:08:52 EDT Ventricular Rate:  63 PR Interval:  223 QRS Duration: 202 QT Interval:  519 QTC Calculation: 532 R Axis:   -72 Text Interpretation: AV dual-paced rhythm Artifact Abnormal ECG Confirmed by Carmin Muskrat [id number]) on 08/31/2022 3:56:28 PM  Radiology CT Chest Wo Contrast  Result Date: 08/31/2022 CLINICAL DATA:  Left posterior thoracic pain with dyspnea. Back pain for 2 weeks. Pleurisy EXAM: CT CHEST WITHOUT CONTRAST TECHNIQUE: Multidetector CT imaging of the chest was performed following the standard protocol without IV contrast. RADIATION DOSE REDUCTION:  This exam was performed according to the departmental dose-optimization program which includes automated exposure control, adjustment of the mA and/or kV according to patient size and/or use of iterative reconstruction technique. COMPARISON:  X-ray 08/24/2022 and older.  Chest CT scan 04/25/2018 FINDINGS: Cardiovascular: Heart is slightly enlarged. No pericardial effusion. Right upper chest pacemaker with leads along the right side of the heart. Coronary artery calcifications are seen. There also calcifications along the aortic valve region. Grossly the thoracic aorta has a normal course and caliber with calcified  plaque. Bovine type aortic arch, a normal variant. There is enlargement of the main pulmonary artery. Please correlate for pulmonary artery hypertension this was seen previously. Main pulmonary artery at that time has a diameter of 4.4 cm and today 4.7 cm on series 2, image 61. Mediastinum/Nodes: Grossly normal course and caliber of the thoracic esophagus. The esophagus is slightly patulous. No specific abnormal lymph node enlargement seen in the axillary region, hilum or mediastinum on this noncontrast dataset. Lungs/Pleura: Breathing motion is noted. No consolidation, pneumothorax or effusion. Stable bandlike area of opacity at the right lung apex, scarring and fibrotic area. There is an area of tree-in-bud reticulonodular changes in the medial left upper lobe as seen best on coronal image 86 of series 5. This was not seen on the study of 2019. Nodules are less than 5 mm in this location. There is left upper lobe anterior scarring and atelectatic changes as well. Mild basilar atelectasis. Calcifications noted of the tracheobronchial tree. Upper Abdomen: The adrenal glands are preserved in the upper abdomen. Previous cholecystectomy. Musculoskeletal: Osteopenia. Degenerative changes along the spine with bridging osteophytes. Since the prior CT scan there is an area of sclerosis which has developed along  the upper sternum as seen best on sagittal image 71. This is of uncertain etiology. Please correlate for any clinical history. IMPRESSION: Enlargement pulmonary artery now measuring up to 4.7 cm. Please correlate for known history. Enlarged heart.  Coronary artery calcifications. Pacemaker. Areas of scarring and atelectatic changes in the lungs. There is a new area of reticulonodular, tree-in-bud change focally in the left upper lobe medially. Possible sequela of atypical infection. Largest nodules are less than 5 mm in this location. No follow-up needed if patient is low-risk (and has no known or suspected primary neoplasm). Non-contrast chest CT can be considered in 12 months if patient is high-risk. This recommendation follows the consensus statement: Guidelines for Management of Incidental Pulmonary Nodules Detected on CT Images: From the Fleischner Society 2017; Radiology 2017; 284:228-243. New area of sclerosis seen along the upper sternum uncertain etiology. This could be area of healing injury. Please correlate for any known history and workup when appropriate Aortic Atherosclerosis (ICD10-I70.0). Electronically Signed   By: Jill Side M.D.   On: 08/31/2022 15:51    Procedures Procedures    Medications Ordered in ED Medications  dexamethasone (DECADRON) injection 10 mg (has no administration in time range)    ED Course/ Medical Decision Making/ A&P                             Medical Decision Making Multiple medical issues including sick sinus syndrome, A-fib, on Eliquis presents with left posterior thoracic pain differential includes pneumonia, pleurisy, less likely PE given the absence of hypotension, hypoxia, tachypnea or tachycardia that is substantial.  Cardiac 60 sinus normal Pulse ox 100% room air normal   Amount and/or Complexity of Data Reviewed Independent Historian: caregiver External Data Reviewed: notes.    Details: Ortho note referral reviewed Labs: ordered.  Decision-making details documented in ED Course. Radiology: ordered and independent interpretation performed. Decision-making details documented in ED Course. ECG/medicine tests: ordered and independent interpretation performed. Decision-making details documented in ED Course.  Risk Prescription drug management. Decision regarding hospitalization.   4:21 PM Patient awake, alert, in no distress, speaking clearly.  I reviewed her CT, labs, and I discussed those with the family members.  With inflammatory changes in the left upper lobe, suspicion for  pleurisy, and this is indeed the area of the patient's pain. She remains in no distress, with no hypoxia, tachypnea, we discussed the importance of taking medication as prescribed, follow-up with primary care, and absent evidence for pneumonia, PE, bacteremia, sepsis, patient discharged in stable condition.  BNP pending on discharge, but as above with reassuring vitals, absence of edema, patient's endorsement of compliance with medications, and with a more likely cause of her symptoms, patient appropriate for discharge without this value returning yet.        Final Clinical Impression(s) / ED Diagnoses Final diagnoses:  Pleurisy    Rx / DC Orders ED Discharge Orders          Ordered    predniSONE (DELTASONE) 20 MG tablet  Daily with breakfast        08/31/22 1621              Carmin Muskrat, MD 08/31/22 1621

## 2022-08-31 NOTE — Discharge Instructions (Addendum)
As discussed, today's evaluation has been generally reassuring.  There is some evidence that there is an inflammatory area in your lungs likely contributing to pleurisy and your pain.  Please continue taking all medication as previously prescribed and obtain and take your newly prescribed steroids.  Return here for concerning changes in your condition.

## 2022-09-04 ENCOUNTER — Telehealth: Payer: Self-pay | Admitting: *Deleted

## 2022-09-04 ENCOUNTER — Encounter: Payer: Self-pay | Admitting: *Deleted

## 2022-09-04 NOTE — Transitions of Care (Post Inpatient/ED Visit) (Signed)
   09/04/2022  Name: [name] MRN: [id number] DOB: 07-Dec-1928  Today's TOC FU Call Status: Today's TOC FU Call Status:: Successful TOC FU Call Competed TOC FU Call Complete Date: 09/04/22  Transition Care Management Follow-up Telephone Call Date of Discharge: 09/01/22 Dis[facility] Type of Discharge: Emergency Department (EMMI Alert for Not having a follow-up appt) Reason for ED Visit: Other: (Pleurisy) How have you been since you were released from the hospital?: Better Any questions or concerns?: No  Items Reviewed: Did you receive and understand the discharge instructions provided?: Yes Medications obtained and verified?: Yes (Medications Reviewed) (completed prednisone today) Any new allergies since your discharge?: No Dietary orders reviewed?: NA Do you have support at home?: Yes People in Home: alone Name of Support/Comfort Primary Source: [name], [name], [name] and Equipment/Supplies: [place] Ordered?: No Any new equipment or medical supplies ordered?: No  Functional Questionnaire: Do you need assistance with bathing/showering or dressing?: No Do you need assistance with meal preparation?: No Do you need assistance with eating?: No Do you have difficulty maintaining continence: No Do you need assistance with getting out of bed/getting out of a chair/moving?: No Do you have difficulty managing or taking your medications?: No  Follow up appointments reviewed: PCP Follow-up appointment confirmed?: Yes Date of PCP follow-up appointment?: 09/15/22 (existing appt. added notes to include ED f/u) Follow-up Provider: [facility] Follow-up appointment confirmed?: NA Do you need transportation to your follow-up appointment?: No Do you understand care options if your condition(s) worsen?: Yes-patient verbalized understanding  SDOH Interventions Today    Flowsheet Row Most Recent Value   SDOH Interventions   Housing Interventions Intervention Not Indicated  Transportation Interventions Intervention Not Indicated       TOC Interventions Today    Flowsheet Row Most Recent Value  TOC Interventions   TOC Interventions Discussed/Reviewed TOC Interventions Discussed, TOC Interventions Reviewed      [name], BSN, RN-BC RN Care Coordinator [place]: [phone] Main #: [phone]

## 2022-09-15 ENCOUNTER — Encounter: Payer: Self-pay | Admitting: Family Medicine

## 2022-09-15 ENCOUNTER — Ambulatory Visit (INDEPENDENT_AMBULATORY_CARE_PROVIDER_SITE_OTHER): Payer: Medicare Other | Admitting: Family Medicine

## 2022-09-15 VITALS — BP 103/67 | HR 69 | Ht 63.0 in | Wt 151.0 lb

## 2022-09-15 DIAGNOSIS — I10 Essential (primary) hypertension: Secondary | ICD-10-CM

## 2022-09-15 DIAGNOSIS — Z7901 Long term (current) use of anticoagulants: Secondary | ICD-10-CM | POA: Diagnosis not present

## 2022-09-15 DIAGNOSIS — E782 Mixed hyperlipidemia: Secondary | ICD-10-CM

## 2022-09-15 DIAGNOSIS — I4819 Other persistent atrial fibrillation: Secondary | ICD-10-CM | POA: Diagnosis not present

## 2022-09-15 DIAGNOSIS — M546 Pain in thoracic spine: Secondary | ICD-10-CM | POA: Diagnosis not present

## 2022-09-15 DIAGNOSIS — D509 Iron deficiency anemia, unspecified: Secondary | ICD-10-CM

## 2022-09-15 LAB — CBC WITH DIFFERENTIAL/PLATELET
Hemoglobin: 8.4 g/dL — CL (ref 11.1–15.9)
Immature Grans (Abs): 0 10*3/uL (ref 0.0–0.1)
Immature Granulocytes: 0 %
MCHC: 32.2 g/dL (ref 31.5–35.7)
MCV: 88 fL (ref 79–97)
Monocytes Absolute: 0.7 10*3/uL (ref 0.1–0.9)
Platelets: 159 10*3/uL (ref 150–450)

## 2022-09-15 LAB — LIPID PANEL

## 2022-09-15 LAB — CMP14+EGFR

## 2022-09-15 MED ORDER — PREDNISONE 20 MG PO TABS: ORAL_TABLET | ORAL | 0 refills | Status: DC

## 2022-09-15 NOTE — Progress Notes (Signed)
BP 103/67   Pulse 69   Ht 5\' 3"  (1.6 m)   Wt 151 lb (68.5 kg)   SpO2 100%   BMI 26.75 kg/m    Subjective:   Patient ID: [name] RouteVirginia B Albanese, female    DOB: 06/11/1929, 89 y.o.   MRN: [id number]  HPI: [name] is a 89 y.o. female presenting on 09/15/2022 for Medical Management of Chronic Issues, Hyperlipidemia, Hypertension, and Back Pain (Pain patches causing irritation)   HPI Patient is coming in today with complaints of left upper back pain.  She says the pain is worse when she is moving her arms or doing things with that.  She did go to the emergency department was given some steroids and it got better but then last night again it was worse when she was trying to use her arms to cut some chicken off the bone and since last night she has been having this sharp upper left-sided back pain.  She denies any pain with inspiration.  She denies cough or pain with inspiration.  Hypertension and A-fib Patient is currently on amiodarone and Eliquis and furosemide and nadolol, and their blood pressure today is 103/67. Patient denies any lightheadedness or dizziness. Patient denies headaches, blurred vision, chest pains, shortness of breath, or weakness. Denies any side effects from medication and is content with current medication.   Hyperlipidemia Patient is coming in for recheck of his hyperlipidemia. The patient is currently taking Zetia. They deny any issues with myalgias or history of liver damage from it. They deny any focal numbness or weakness or chest pain.   Anemia recheck Patient is coming in for anemia recheck.  She denies any lightheadedness or dizziness or bleeding.  Relevant past medical, surgical, family and social history reviewed and updated as indicated. Interim medical history since our last visit reviewed. Allergies and medications reviewed and updated.  Review of Systems  Constitutional:  Negative for chills and fever.  HENT:  Negative for congestion, ear  discharge and ear pain.   Eyes:  Negative for redness and visual disturbance.  Respiratory:  Negative for chest tightness and shortness of breath.   Cardiovascular:  Negative for chest pain and leg swelling.  Genitourinary:  Negative for difficulty urinating and dysuria.  Musculoskeletal:  Positive for arthralgias, back pain and myalgias. Negative for gait problem.  Skin:  Negative for rash.  Neurological:  Negative for light-headedness and headaches.  Psychiatric/Behavioral:  Negative for agitation and behavioral problems.   All other systems reviewed and are negative.   Per HPI unless specifically indicated above   Allergies as of 09/15/2022       Reactions   Iodinated Contrast Media Hives, Itching, Rash   03/13/14: symptoms began within two hours of intrathecal injection (myelogram 03/11/14); on shoulders radiating down to stomach.  Relief with Benadryl.  Told patient she would need to pre-med with Benadryl in the future before receiving this contrast.  Donell SievertJeanne Lohr, RN   Naproxen Hives, Itching, Rash        Medication List        Accurate as of September 15, 2022  2:55 PM. If you have any questions, ask your nurse or doctor.          STOP taking these medications    albuterol 108 (90 Base) MCG/ACT inhaler Commonly known as: VENTOLIN HFA Stopped by: Elige RadonJoshua A Ernestyne Caldwell, MD       TAKE these medications    acetaminophen 500 MG tablet Commonly known as:  TYLENOL Take 1,000 mg by mouth every 6 (six) hours as needed for mild pain.   amiodarone 200 MG tablet Commonly known as: PACERONE TAKE ONE (1) TABLET EACH DAY   baclofen 10 MG tablet Commonly known as: LIORESAL Take 0.5 tablets (5 mg total) by mouth 3 (three) times daily.   Benefiber Powd Take 1 Dose by mouth daily as needed (constipation).   calcium-vitamin D 500-200 MG-UNIT tablet Commonly known as: OSCAL WITH D Take 1 tablet by mouth daily with breakfast.   cyanocobalamin 1000 MCG/ML injection Commonly known  as: VITAMIN B12 Inject 1 mL (1,000 mcg total) into the muscle every 30 (thirty) days.   Eliquis 5 MG Tabs tablet Generic drug: apixaban TAKE ONE TABLET BY MOUTH TWICE DAILY   ezetimibe 10 MG tablet Commonly known as: ZETIA Take 1 tablet (10 mg total) by mouth daily.   furosemide 40 MG tablet Commonly known as: LASIX TAKE 1 TABLET BY MOUTH DAILY. MAY TAKE AN EXTRA 1/2 TABLET DAILY AS NEEDED FOR LEG SWELLING OR WEIGHT GAIN   latanoprost 0.005 % ophthalmic solution Commonly known as: XALATAN Place 1 drop into both eyes at bedtime.   multivitamin tablet Take 1 tablet by mouth every morning.   nadolol 40 MG tablet Commonly known as: CORGARD TAKE 1 AND 1/2 TABLETS DAILY   nitroGLYCERIN 0.4 MG SL tablet Commonly known as: NITROSTAT DISSOLVE 1 TAB UNDER TOUNGE FOR CHEST PAIN. MAY REPEAT EVERY 5 MINUTES FOR 3 DOSES. IF NO RELIEF CALL 911 OR GO TO ER What changed: See the new instructions.   nystatin cream Commonly known as: MYCOSTATIN Apply 1 Application topically 2 (two) times daily.   OVER THE COUNTER MEDICATION Take 1 tablet by mouth daily. calcium   pantoprazole 40 MG tablet Commonly known as: PROTONIX TAKE ONE (1) TABLET BY MOUTH EVERY DAY   predniSONE 20 MG tablet Commonly known as: DELTASONE Take 3 tabs daily for 1 week, then 2 tabs daily for week 2, then 1 tab daily for week 3. What changed:  how much to take how to take this when to take this additional instructions Changed by: Elige Radon Petrice Beedy, MD         Objective:   BP 103/67   Pulse 69   Ht 5\' 3"  (1.6 m)   Wt 151 lb (68.5 kg)   SpO2 100%   BMI 26.75 kg/m   Wt Readings from Last 3 Encounters:  09/15/22 151 lb (68.5 kg)  08/31/22 156 lb 12 oz (71.1 kg)  08/24/22 156 lb 12.8 oz (71.1 kg)    Physical Exam Vitals and nursing note reviewed.  Constitutional:      General: She is not in acute distress.    Appearance: She is well-developed. She is not diaphoretic.  Eyes:      Conjunctiva/sclera: Conjunctivae normal.  Cardiovascular:     Rate and Rhythm: Normal rate and regular rhythm.     Heart sounds: Normal heart sounds. No murmur heard. Pulmonary:     Effort: Pulmonary effort is normal. No respiratory distress.     Breath sounds: Normal breath sounds. No wheezing.  Musculoskeletal:        General: Normal range of motion.       Arms:     Thoracic back: Tenderness present. No bony tenderness. Normal range of motion.  Skin:    General: Skin is warm and dry.     Findings: No rash.  Neurological:     Mental Status: She is alert and oriented  to person, place, and time.     Coordination: Coordination normal.  Psychiatric:        Behavior: Behavior normal.    Pain with movement of upper arm and shoulder.   Assessment & Plan:   Problem List Items Addressed This Visit       Cardiovascular and Mediastinum   Essential hypertension, benign (Chronic)   Relevant Orders   CBC with Differential/Platelet   CMP14+EGFR   TSH   Persistent atrial fibrillation   Relevant Orders   CBC with Differential/Platelet   TSH     Other   Hyperlipidemia - Primary (Chronic)   Relevant Orders   CBC with Differential/Platelet   Lipid panel   TSH   Iron deficiency anemia (Chronic)   Relevant Orders   CBC with Differential/Platelet   TSH   Chronic anticoagulation   Relevant Orders   CBC with Differential/Platelet   Other Visit Diagnoses     Acute left-sided thoracic back pain       Relevant Medications   predniSONE (DELTASONE) 20 MG tablet   Other Relevant Orders   CT THORACIC SPINE WO CONTRAST       Will do CT chest for upper back.  Will also do blood work today. Follow up plan: Return in about 6 months (around 03/17/2023), or if symptoms worsen or fail to improve, for Hypertension and hyperlipidemia.  Counseling provided for all of the vaccine components Orders Placed This Encounter  Procedures   CT THORACIC SPINE WO CONTRAST   CBC with  Differential/Platelet   CMP14+EGFR   Lipid panel   TSH    Arville CareJoshua Conrado Nance, MD [facility] 09/15/2022, 2:55 PM

## 2022-09-16 ENCOUNTER — Telehealth: Payer: Self-pay | Admitting: Family Medicine

## 2022-09-16 LAB — CBC WITH DIFFERENTIAL/PLATELET
Hematocrit: 26.1 % — ABNORMAL LOW (ref 34.0–46.6)
Lymphocytes Absolute: 1.3 10*3/uL (ref 0.7–3.1)
MCH: 28.2 pg (ref 26.6–33.0)
WBC: 4.8 10*3/uL (ref 3.4–10.8)

## 2022-09-16 LAB — CMP14+EGFR
Alkaline Phosphatase: 64 IU/L (ref 44–121)
BUN/Creatinine Ratio: 20 (ref 12–28)
Calcium: 9.4 mg/dL (ref 8.7–10.3)

## 2022-09-16 LAB — LIPID PANEL
Cholesterol, Total: 143 mg/dL (ref 100–199)
VLDL Cholesterol Cal: 16 mg/dL (ref 5–40)

## 2022-09-16 NOTE — Telephone Encounter (Signed)
On call provider was called pertaining to critical Hgb. Attempted to call pt and her son several times without an answer.

## 2022-09-18 ENCOUNTER — Other Ambulatory Visit: Payer: Self-pay | Admitting: *Deleted

## 2022-09-18 DIAGNOSIS — D509 Iron deficiency anemia, unspecified: Secondary | ICD-10-CM

## 2022-09-18 LAB — CMP14+EGFR
ALT: 18 IU/L (ref 0–32)
AST: 18 IU/L (ref 0–40)
Albumin/Globulin Ratio: 1.9 (ref 1.2–2.2)
Albumin: 4.2 g/dL (ref 3.6–4.6)
Bilirubin Total: 0.5 mg/dL (ref 0.0–1.2)
CO2: 25 mmol/L (ref 20–29)
Chloride: 101 mmol/L (ref 96–106)
Globulin, Total: 2.2 g/dL (ref 1.5–4.5)
Glucose: 109 mg/dL — ABNORMAL HIGH (ref 70–99)
Potassium: 4 mmol/L (ref 3.5–5.2)
Sodium: 139 mmol/L (ref 134–144)
Total Protein: 6.4 g/dL (ref 6.0–8.5)
eGFR: 45 mL/min/{1.73_m2} — ABNORMAL LOW (ref >59)

## 2022-09-18 LAB — CBC WITH DIFFERENTIAL/PLATELET
Basophils Absolute: 0 10*3/uL (ref 0.0–0.2)
Basos: 0 %
EOS (ABSOLUTE): 0.1 10*3/uL (ref 0.0–0.4)
Eos: 3 %
Lymphs: 28 %
Monocytes: 14 %
Neutrophils Absolute: 2.6 10*3/uL (ref 1.4–7.0)
Neutrophils: 55 %
RBC: 2.98 x10E6/uL — ABNORMAL LOW (ref 3.77–5.28)
RDW: 15.9 % — ABNORMAL HIGH (ref 11.7–15.4)

## 2022-09-18 LAB — TSH: TSH: 1.43 u[IU]/mL (ref 0.450–4.500)

## 2022-09-18 LAB — LIPID PANEL
HDL: 60 mg/dL (ref >39)
Triglycerides: 82 mg/dL (ref 0–149)

## 2022-09-19 ENCOUNTER — Ambulatory Visit (HOSPITAL_COMMUNITY)
Admission: RE | Admit: 2022-09-19 | Discharge: 2022-09-19 | Disposition: A | Discharge status: Home / Self Care | Payer: Medicare Other | Source: Ambulatory Visit | Attending: Family Medicine | Admitting: Family Medicine

## 2022-09-19 DIAGNOSIS — M546 Pain in thoracic spine: Secondary | ICD-10-CM | POA: Diagnosis not present

## 2022-09-19 DIAGNOSIS — M47814 Spondylosis without myelopathy or radiculopathy, thoracic region: Secondary | ICD-10-CM | POA: Diagnosis not present

## 2022-09-21 ENCOUNTER — Telehealth: Payer: Self-pay | Admitting: Family Medicine

## 2022-09-21 ENCOUNTER — Other Ambulatory Visit: Payer: Self-pay | Admitting: Family Medicine

## 2022-09-21 ENCOUNTER — Other Ambulatory Visit: Payer: Medicare Other

## 2022-09-21 DIAGNOSIS — D509 Iron deficiency anemia, unspecified: Secondary | ICD-10-CM

## 2022-09-21 DIAGNOSIS — M546 Pain in thoracic spine: Secondary | ICD-10-CM

## 2022-09-21 DIAGNOSIS — M5104 Intervertebral disc disorders with myelopathy, thoracic region: Secondary | ICD-10-CM

## 2022-09-21 LAB — CBC WITH DIFFERENTIAL/PLATELET
EOS (ABSOLUTE): 0 10*3/uL (ref 0.0–0.4)
Hemoglobin: 8.7 g/dL — CL (ref 11.1–15.9)
Immature Granulocytes: 0 %
Monocytes Absolute: 0.8 10*3/uL (ref 0.1–0.9)
RBC: 3.19 x10E6/uL — ABNORMAL LOW (ref 3.77–5.28)

## 2022-09-21 MED ORDER — LIDOCAINE 5 % EX OINT
1.0000 | TOPICAL_OINTMENT | CUTANEOUS | 0 refills | Status: DC | PRN
Start: 2022-09-21 — End: 2022-11-07

## 2022-09-21 NOTE — Telephone Encounter (Signed)
Patient aware that results have not been read and we will get in touch with [facility] radiology to see if they can read it. I spoke with [name] in [place] at [facility] Radiology is running behind and she will call them to see if they would go ahead and read the scan. Patients daughter states she is in a lot of pain and her appetite is decreasing a lot.

## 2022-09-22 ENCOUNTER — Telehealth: Payer: Self-pay | Admitting: Family Medicine

## 2022-09-22 LAB — CBC WITH DIFFERENTIAL/PLATELET
Basophils Absolute: 0 10*3/uL (ref 0.0–0.2)
Basos: 0 %
Eos: 0 %
Hematocrit: 27.4 % — ABNORMAL LOW (ref 34.0–46.6)
Immature Grans (Abs): 0 10*3/uL (ref 0.0–0.1)
Lymphocytes Absolute: 2.5 10*3/uL (ref 0.7–3.1)
Lymphs: 37 %
MCH: 27.3 pg (ref 26.6–33.0)
MCHC: 31.8 g/dL (ref 31.5–35.7)
MCV: 86 fL (ref 79–97)
Monocytes: 12 %
Neutrophils Absolute: 3.4 10*3/uL (ref 1.4–7.0)
Neutrophils: 51 %
Platelets: 192 10*3/uL (ref 150–450)
RDW: 15.7 % — ABNORMAL HIGH (ref 11.7–15.4)
WBC: 6.8 10*3/uL (ref 3.4–10.8)

## 2022-09-22 NOTE — Telephone Encounter (Signed)
Already dealt with in a result note.

## 2022-09-22 NOTE — Telephone Encounter (Signed)
[facility] called with critical lab result.   Hgb 8.7 

## 2022-09-25 ENCOUNTER — Other Ambulatory Visit: Payer: Self-pay | Admitting: Family Medicine

## 2022-10-02 ENCOUNTER — Ambulatory Visit (INDEPENDENT_AMBULATORY_CARE_PROVIDER_SITE_OTHER): Payer: Medicare Other

## 2022-10-02 DIAGNOSIS — I4892 Unspecified atrial flutter: Secondary | ICD-10-CM

## 2022-10-03 LAB — CUP PACEART REMOTE DEVICE CHECK
Battery Remaining Longevity: 100 mo
Battery Remaining Percentage: 92 %
Battery Voltage: 3.01 V
Brady Statistic AP VP Percent: 99 %
Brady Statistic AP VS Percent: 1 %
Brady Statistic AS VP Percent: 1 %
Brady Statistic AS VS Percent: 0 %
Brady Statistic RA Percent Paced: 99 %
Brady Statistic RV Percent Paced: 99 %
Date Time Interrogation Session: 20240422171448
Implantable Lead Connection Status: 753985
Implantable Lead Connection Status: 753985
Implantable Lead Implant Date: 20031028
Implantable Lead Implant Date: 20031028
Implantable Lead Location: 753859
Implantable Lead Location: 753860
Implantable Pulse Generator Implant Date: 20230421
Lead Channel Impedance Value: 350 Ohm
Lead Channel Impedance Value: 430 Ohm
Lead Channel Pacing Threshold Amplitude: 0.5 V
Lead Channel Pacing Threshold Amplitude: 0.5 V
Lead Channel Pacing Threshold Pulse Width: 0.5 ms
Lead Channel Pacing Threshold Pulse Width: 0.5 ms
Lead Channel Sensing Intrinsic Amplitude: 12 mV
Lead Channel Setting Pacing Amplitude: 0.75 V
Lead Channel Setting Pacing Amplitude: 2 V
Lead Channel Setting Pacing Pulse Width: 0.5 ms
Lead Channel Setting Sensing Sensitivity: 4 mV
Pulse Gen Model: 2272
Pulse Gen Serial Number: 8074688

## 2022-10-04 ENCOUNTER — Other Ambulatory Visit: Payer: Self-pay | Admitting: Family Medicine

## 2022-10-04 DIAGNOSIS — I480 Paroxysmal atrial fibrillation: Secondary | ICD-10-CM

## 2022-10-04 MED ORDER — APIXABAN 5 MG PO TABS
5.0000 mg | ORAL_TABLET | Freq: Two times a day (BID) | ORAL | 1 refills | Status: DC
Start: 2022-10-04 — End: 2022-12-12

## 2022-10-04 NOTE — Telephone Encounter (Signed)
RCVD VM from pt requesting 90-d supply of her Eliquis, this is the only med of hers that is not a 3 mos supply. Script sent

## 2022-10-04 NOTE — Addendum Note (Signed)
Addended by: Julious Payer D on: 10/04/2022 03:48 PM   Modules accepted: Orders

## 2022-10-10 ENCOUNTER — Emergency Department (HOSPITAL_COMMUNITY)
Admission: EM | Admit: 2022-10-10 | Discharge: 2022-10-10 | Disposition: A | Discharge status: Home / Self Care | Payer: Medicare Other | Attending: Emergency Medicine | Admitting: Emergency Medicine

## 2022-10-10 ENCOUNTER — Emergency Department (HOSPITAL_COMMUNITY): Payer: Medicare Other

## 2022-10-10 DIAGNOSIS — S0990XA Unspecified injury of head, initial encounter: Secondary | ICD-10-CM | POA: Diagnosis not present

## 2022-10-10 DIAGNOSIS — R0689 Other abnormalities of breathing: Secondary | ICD-10-CM | POA: Diagnosis not present

## 2022-10-10 DIAGNOSIS — Z79899 Other long term (current) drug therapy: Secondary | ICD-10-CM | POA: Diagnosis not present

## 2022-10-10 DIAGNOSIS — I10 Essential (primary) hypertension: Secondary | ICD-10-CM | POA: Diagnosis not present

## 2022-10-10 DIAGNOSIS — S22080A Wedge compression fracture of T11-T12 vertebra, initial encounter for closed fracture: Secondary | ICD-10-CM

## 2022-10-10 DIAGNOSIS — R519 Headache, unspecified: Secondary | ICD-10-CM | POA: Insufficient documentation

## 2022-10-10 DIAGNOSIS — W19XXXA Unspecified fall, initial encounter: Secondary | ICD-10-CM | POA: Diagnosis not present

## 2022-10-10 DIAGNOSIS — S3992XA Unspecified injury of lower back, initial encounter: Secondary | ICD-10-CM | POA: Diagnosis present

## 2022-10-10 DIAGNOSIS — M549 Dorsalgia, unspecified: Secondary | ICD-10-CM | POA: Diagnosis not present

## 2022-10-10 DIAGNOSIS — M8588 Other specified disorders of bone density and structure, other site: Secondary | ICD-10-CM | POA: Diagnosis not present

## 2022-10-10 DIAGNOSIS — W182XXA Fall in (into) shower or empty bathtub, initial encounter: Secondary | ICD-10-CM | POA: Diagnosis not present

## 2022-10-10 DIAGNOSIS — R000 Tachycardia, unspecified: Secondary | ICD-10-CM | POA: Diagnosis not present

## 2022-10-10 DIAGNOSIS — Z7901 Long term (current) use of anticoagulants: Secondary | ICD-10-CM | POA: Insufficient documentation

## 2022-10-10 LAB — CBC WITH DIFFERENTIAL/PLATELET
Abs Immature Granulocytes: 0.04 10*3/uL (ref 0.00–0.07)
Basophils Absolute: 0 10*3/uL (ref 0.0–0.1)
Basophils Relative: 0 %
Eosinophils Absolute: 0.1 10*3/uL (ref 0.0–0.5)
Eosinophils Relative: 2 %
HCT: 26.3 % — ABNORMAL LOW (ref 36.0–46.0)
Hemoglobin: 7.9 g/dL — ABNORMAL LOW (ref 12.0–15.0)
Immature Granulocytes: 1 %
Lymphocytes Relative: 19 %
Lymphs Abs: 0.9 10*3/uL (ref 0.7–4.0)
MCH: 26.6 pg (ref 26.0–34.0)
MCHC: 30 g/dL (ref 30.0–36.0)
MCV: 88.6 fL (ref 80.0–100.0)
Monocytes Absolute: 0.6 10*3/uL (ref 0.1–1.0)
Monocytes Relative: 12 %
Neutro Abs: 3.3 10*3/uL (ref 1.7–7.7)
Neutrophils Relative %: 66 %
Platelets: 158 10*3/uL (ref 150–400)
RBC: 2.97 MIL/uL — ABNORMAL LOW (ref 3.87–5.11)
RDW: 17.8 % — ABNORMAL HIGH (ref 11.5–15.5)
WBC: 5 10*3/uL (ref 4.0–10.5)
nRBC: 0 % (ref 0.0–0.2)

## 2022-10-10 LAB — BASIC METABOLIC PANEL
Anion gap: 8 (ref 5–15)
BUN: 27 mg/dL — ABNORMAL HIGH (ref 8–23)
CO2: 27 mmol/L (ref 22–32)
Calcium: 8.7 mg/dL — ABNORMAL LOW (ref 8.9–10.3)
Chloride: 98 mmol/L (ref 98–111)
Creatinine, Ser: 1.01 mg/dL — ABNORMAL HIGH (ref 0.44–1.00)
GFR, Estimated: 53 mL/min — ABNORMAL LOW (ref >60)
Glucose, Bld: 112 mg/dL — ABNORMAL HIGH (ref 70–99)
Potassium: 3.6 mmol/L (ref 3.5–5.1)
Sodium: 133 mmol/L — ABNORMAL LOW (ref 135–145)

## 2022-10-10 MED ORDER — HYDROCODONE-ACETAMINOPHEN 5-325 MG PO TABS: 2.0000 | ORAL_TABLET | ORAL | 0 refills | Status: DC | PRN

## 2022-10-10 NOTE — ED Notes (Signed)
Patient transported to CT 

## 2022-10-10 NOTE — ED Notes (Signed)
Paged ortho tech pager at this time.[name]

## 2022-10-10 NOTE — ED Notes (Signed)
Pt and son updated on POC by [place] PA.  Awaiting brace from [facility]

## 2022-10-10 NOTE — ED Provider Notes (Signed)
[facility] [place] EMERGENCY DEPARTMENT AT [facility] Provider Note   CSN: [id number] Arrival date & time: 10/10/22  1347     History  Chief Complaint  Patient presents with   [name] is a 89 y.o. female.  Patient presents to the emergency department via EMS complaining of low back pain secondary to a fall.  Patient states she was getting out of her bathtub when she did not use the handrail, slipped, and fell directly on her buttocks.  She also endorses scraping the back of her head during the fall.  The patient did not lose consciousness but does endorse Eliquis usage.  The patient states that upon standing with EMS she had a sudden increase in low back pain.  EMS administered morphine during transport.  Past medical history significant for persistent atrial fibrillation, arthritis, GERD, hypertension  HPI     Home Medications Prior to Admission medications   Medication Sig Start Date End Date Taking? Authorizing Provider  HYDROcodone-acetaminophen (NORCO/VICODIN) 5-325 MG tablet Take 2 tablets by mouth every 4 (four) hours as needed. 10/10/22  Yes Darrick Grinder, PA-C  acetaminophen (TYLENOL) 500 MG tablet Take 1,000 mg by mouth every 6 (six) hours as needed for mild pain.    [provider]  amiodarone (PACERONE) 200 MG tablet TAKE ONE (1) TABLET EACH DAY 07/31/22   Dettinger, Elige Radon, MD  apixaban (ELIQUIS) 5 MG TABS tablet Take 1 tablet (5 mg total) by mouth 2 (two) times daily. 10/04/22   Dettinger, Elige Radon, MD  baclofen (LIORESAL) 10 MG tablet Take 0.5 tablets (5 mg total) by mouth 3 (three) times daily. 08/24/22   Junie Spencer, FNP  calcium-vitamin D (OSCAL WITH D) 500-200 MG-UNIT tablet Take 1 tablet by mouth daily with breakfast.     [provider]  cyanocobalamin (VITAMIN B12) 1000 MCG/ML injection Inject 1 mL (1,000 mcg total) into the muscle every 30 (thirty) days. 04/10/22   Dettinger, Elige Radon, MD  ezetimibe (ZETIA) 10 MG  tablet Take 1 tablet (10 mg total) by mouth daily. 08/08/22   Jonelle Sidle, MD  furosemide (LASIX) 40 MG tablet TAKE 1 TABLET BY MOUTH DAILY. MAY TAKE AN EXTRA 1/2 TABLET DAILY AS NEEDED FOR LEG SWELLING OR WEIGHT GAIN 08/22/22   Jonelle Sidle, MD  latanoprost (XALATAN) 0.005 % ophthalmic solution Place 1 drop into both eyes at bedtime.    [provider]  lidocaine (XYLOCAINE) 5 % ointment Apply 1 Application topically as needed. 09/21/22   Dettinger, Elige Radon, MD  Multiple Vitamin (MULTIVITAMIN) tablet Take 1 tablet by mouth every morning.    [provider]  nadolol (CORGARD) 40 MG tablet TAKE 1 AND 1/2 TABLETS DAILY 09/26/22   Dettinger, Elige Radon, MD  nitroGLYCERIN (NITROSTAT) 0.4 MG SL tablet DISSOLVE 1 TAB UNDER TOUNGE FOR CHEST PAIN. MAY REPEAT EVERY 5 MINUTES FOR 3 DOSES. IF NO RELIEF CALL 911 OR GO TO ER Patient taking differently: Place 0.4 mg under the tongue every 5 (five) minutes as needed for chest pain. 09/10/20   Jonelle Sidle, MD  nystatin cream (MYCOSTATIN) Apply 1 Application topically 2 (two) times daily. 07/26/22   Gabriel Earing, FNP  OVER THE COUNTER MEDICATION Take 1 tablet by mouth daily. calcium    [provider]  pantoprazole (PROTONIX) 40 MG tablet TAKE ONE (1) TABLET BY MOUTH EVERY DAY 07/31/22   Dettinger, Elige Radon, MD  predniSONE (DELTASONE) 20 MG tablet Take  3 tabs daily for 1 week, then 2 tabs daily for week 2, then 1 tab daily for week 3. 09/15/22   Dettinger, Elige Radon, MD  Wheat Dextrin (BENEFIBER) POWD Take 1 Dose by mouth daily as needed (constipation).    [provider]      Allergies    Iodinated contrast media and Naproxen    Review of Systems   Review of Systems  Physical Exam Updated Vital Signs BP (!) 109/59   Pulse 61   Temp 98.4 F (36.9 C)   Resp 18   Ht 5\' 3"  (1.6 m)   Wt 65.8 kg   SpO2 96%   BMI 25.69 kg/m  Physical Exam Vitals and nursing note reviewed.  Constitutional:      General:  She is not in acute distress.    Appearance: She is well-developed.  HENT:     Head: Normocephalic and atraumatic.  Eyes:     Extraocular Movements: Extraocular movements intact.     Conjunctiva/sclera: Conjunctivae normal.     Pupils: Pupils are equal, round, and reactive to light.  Cardiovascular:     Rate and Rhythm: Normal rate and regular rhythm.     Heart sounds: No murmur heard. Pulmonary:     Effort: Pulmonary effort is normal. No respiratory distress.     Breath sounds: Normal breath sounds.  Abdominal:     Palpations: Abdomen is soft.     Tenderness: There is no abdominal tenderness.  Musculoskeletal:        General: Tenderness (Midline spinal tenderness at the lower thoracic, upper lumbar region) present. No swelling.     Cervical back: Neck supple.  Skin:    General: Skin is warm and dry.     Capillary Refill: Capillary refill takes less than 2 seconds.  Neurological:     Mental Status: She is alert.     Sensory: No sensory deficit.     Motor: No weakness.     Gait: Gait normal.     Comments: Patient with normal gait, equal lower extremity strength bilaterally, no sensory deficit in the lower extremities  Psychiatric:        Mood and Affect: Mood normal.     ED Results / Procedures / Treatments   Labs (all labs ordered are listed, but only abnormal results are displayed) Labs Reviewed  BASIC METABOLIC PANEL - Abnormal; Notable for the following components:      Result Value   Sodium 133 (*)    Glucose, Bld 112 (*)    BUN 27 (*)    Creatinine, Ser 1.01 (*)    Calcium 8.7 (*)    GFR, Estimated 53 (*)    All other components within normal limits  CBC WITH DIFFERENTIAL/PLATELET - Abnormal; Notable for the following components:   RBC 2.97 (*)    Hemoglobin 7.9 (*)    HCT 26.3 (*)    RDW 17.8 (*)    All other components within normal limits    EKG None  Radiology CT Lumbar Spine Wo Contrast  Result Date: 10/10/2022 CLINICAL DATA:  Back trauma, fall  EXAM: CT LUMBAR SPINE WITHOUT CONTRAST TECHNIQUE: Multidetector CT imaging of the lumbar spine was performed without intravenous contrast administration. Multiplanar CT image reconstructions were also generated. RADIATION DOSE REDUCTION: This exam was performed according to the departmental dose-optimization program which includes automated exposure control, adjustment of the mA and/or kV according to patient size and/or use of iterative reconstruction technique. COMPARISON:  No prior CT  lumbar spine available; correlation is made with 07/02/2021 CT abdomen pelvis FINDINGS: Segmentation: 5 lumbar type vertebral bodies. Alignment: Mild dextrocurvature.  No significant listhesis. Vertebrae: Inferior endplate compression fracture at T12 (series 7, image 32), with approximately 15% vertebral body height loss, favored to be acute. No retropulsion of the posterior cortex. The fracture extends through the anterior cortex bilateral (series 8, image 39). Osteopenia. No other acute fracture or significant vertebral body height loss. No suspicious osseous lesion. Remote right L3 L4 transverse process fractures. Paraspinal and other soft tissues: Aortic atherosclerosis. Diverticulosis without evidence of diverticulitis. Disc levels: Disc heights are well preserved for age. No significant spinal canal stenosis or neural foraminal narrowing. IMPRESSION: 1. Inferior endplate compression fracture at T12, favored to be acute, with approximately 15% vertebral body height loss. No retropulsion of the posterior cortex. 2. No significant spinal canal stenosis or neural foraminal narrowing. These results were called by telephone at the time of interpretation on 10/10/2022 at 2:51 pm to provider BUTLER , who verbally acknowledged these results. Electronically Signed   By: Wiliam Ke M.D.   On: 10/10/2022 14:52   CT Head Wo Contrast  Result Date: 10/10/2022 CLINICAL DATA:  Provided history: Head trauma, minor. Fall. EXAM: CT HEAD  WITHOUT CONTRAST TECHNIQUE: Contiguous axial images were obtained from the base of the skull through the vertex without intravenous contrast. RADIATION DOSE REDUCTION: This exam was performed according to the departmental dose-optimization program which includes automated exposure control, adjustment of the mA and/or kV according to patient size and/or use of iterative reconstruction technique. COMPARISON:  Head CT examinations 10/28/2015 and earlier. FINDINGS: Brain: Mild generalized cerebral atrophy. Redemonstrated small focus of hypodensity within the inferior right basal ganglia, likely reflecting a prominent perivascular space. Mineralization within the bilateral basal ganglia. There is no acute intracranial hemorrhage. No demarcated cortical infarct. No extra-axial fluid collection. No evidence of an intracranial mass. No midline shift. Vascular: No hyperdense vessel.  Atherosclerotic calcifications. Skull: No fracture or aggressive osseous lesion. Sinuses/Orbits: No mass or acute finding within the imaged orbits. Incompletely imaged mucosal thickening within the left maxillary sinus, at least moderate in severity. IMPRESSION: 1.  No evidence of an acute intracranial abnormality. 2. Mild generalized cerebral atrophy. 3. Incompletely imaged mucosal thickening within the left maxillary sinus, at least moderate. Electronically Signed   By: Jackey Loge D.O.   On: 10/10/2022 14:41    Procedures .Ortho Injury Treatment  Date/Time: 10/10/2022 5:22 PM  Performed by: Darrick Grinder, PA-C Authorized by: Darrick Grinder, PA-C   Consent:    Consent obtained:  Verbal   Consent given by:  Patient   Risks discussed:  Vascular damage, stiffness and restricted joint movement   Alternatives discussed:  No treatmentInjury location: spine Location details T12 Injury type: fracture Fracture type: vertebral body Pre-procedure neurovascular assessment: neurovascularly intact Immobilization: brace  (TLSO) Splint Applied by: Milon Dikes Post-procedure neurovascular assessment: post-procedure neurovascularly intact       Medications Ordered in ED Medications - No data to display  ED Course/ Medical Decision Making/ A&P Clinical Course as of 10/10/22 1723  Tue Oct 10, 2022  [id number] 89 year old female here with low back pain after a fall.  She is on Eliquis did not hit her head did not lose consciousness.  Getting CT of her head and CT lumbar spine.  Lumbar spine showing T12 compression fracture.  Will need brace pain control and ambulation trial to see if appropriate for discharge. [MB]    Clinical Course User  Index [MB] Terrilee Files, MD                             Medical Decision Making Amount and/or Complexity of Data Reviewed Labs: ordered. Radiology: ordered.   This patient presents to the ED for concern of low back pain secondary to a fall, this involves an extensive number of treatment options, and is a complaint that carries with it a high risk of complications and morbidity.  The differential diagnosis includes fracture, dislocation, soft tissue injury.  The patient also complains of hitting her head.  Differential includes intracranial abnormality, fracture, soft tissue injury, and others   Co morbidities that complicate the patient evaluation  Arthritis   Additional history obtained:  Additional history obtained from EMS  Lab Tests:  I Ordered, and personally interpreted labs.  The pertinent results include: Grossly unremarkable BMP.  Hemoglobin 7.9 (over the past month hemoglobin has been in the 8 range, followed by primary care)   Imaging Studies ordered:  I ordered imaging studies including CT head, CT lumbar spine I independently visualized and interpreted imaging which showed no acute intracranial abnormality. 1. Inferior endplate compression fracture at T12, favored to be  acute, with approximately 15% vertebral body height loss. No  retropulsion  of the posterior cortex.  2. No significant spinal canal stenosis or neural foraminal  narrowing.   I agree with the radiologist interpretation    Social Determinants of Health:  Patient lives at home alone, patient's son lives across the street   Test / Admission - Considered:  Patient's fall was mechanical in nature.  Patient has a clear recollection of exactly what led to the fall with a slip in the bathtub.  No indication for further medical workup at this time.  Patient has chronically low hemoglobin which is being addressed by primary care.  Patient does have an inferior endplate compression fracture at T12.  She was placed in a TLSO brace.  She was able to ambulate without difficulty after placement of the brace and would like to discharge home.  This seems perfectly reasonable.  Plan to discharge with a short course of pain medication for breakthrough pain.  Patient will follow-up with neurosurgery for further evaluation and management.        Final Clinical Impression(s) / ED Diagnoses Final diagnoses:  T12 compression fracture, initial encounter [facility])    Rx / DC Orders ED Discharge Orders          Ordered    HYDROcodone-acetaminophen (NORCO/VICODIN) 5-325 MG tablet  Every 4 hours PRN        10/10/22 1721              Pamala Duffel 10/10/22 1723    Terrilee Files, MD 10/10/22 [phone]

## 2022-10-10 NOTE — Discharge Instructions (Addendum)
You were diagnosed today with a compression fracture at the T12 vertebrae.  Please utilize the brace as provided.  Please follow-up with neurosurgery for further evaluation and management.  I have prescribed a short course of pain medication which can be used if the Tylenol is not strong enough.  Do not exceed 4000 mg of acetaminophen from all sources in a 24-hour period.  (Each tablet of prescription pain medication is 325 mg of acetaminophen).  If you develop any life-threatening symptoms please return to the emergency department.

## 2022-10-10 NOTE — ED Notes (Signed)
Up amb with walker and did well.  States she had no increase in pain

## 2022-10-10 NOTE — ED Notes (Signed)
Patient denies pain and is resting comfortably.  

## 2022-10-10 NOTE — Progress Notes (Signed)
Orthopedic Tech Progress Note Patient Details:  [name] 04-13-1929 [id number]  Called in order to [employer] for a TLSO BRACE   Patient ID: [name], female   DOB: November 14, 1928, 89 y.o.   MRN: [id number]  [name] 10/10/2022, 3:20 PM

## 2022-10-10 NOTE — ED Triage Notes (Signed)
Fell in BR landing on buttocks.  Having lower back pain.  States she "scraped" her head on laundry basket.  No LOC. On blood thinner

## 2022-10-10 NOTE — ED Notes (Signed)
Ortho tech here and placed the back brace on.  Pt tol. Well and able to amb well to chair.  States it caused no increase in discomfort.

## 2022-10-11 ENCOUNTER — Telehealth: Payer: Self-pay | Admitting: Family Medicine

## 2022-10-11 NOTE — Telephone Encounter (Signed)
Fell yesterday  Hgb is low 7.9  Keeps dropping  Weak and in pain  Has follow up with neurosurgeon.  Per Dettinger pt needs to be seen in the office to discuss.  Advised to increase protein and iron rich foods until the appt  Does have dark stools. Discussed may be needing stool cards.

## 2022-10-11 NOTE — Telephone Encounter (Signed)
Pts daughter in law/caregiver called stating that pt had a fall and was seen in the ER and needs to speak with Dr Museum/gallery exhibitions officer or nurse regarding her ER visit and lab work to discuss concerns.  Tried explaining to her that we needed to schedule pt to be seen for an ER Follow Up so that everything can be discussed. The lady declined and insisted that she didn't need an appt to discuss this with Dr Dettinger or nurse.   Please advise.

## 2022-10-12 ENCOUNTER — Ambulatory Visit (INDEPENDENT_AMBULATORY_CARE_PROVIDER_SITE_OTHER): Payer: Medicare Other | Admitting: Family Medicine

## 2022-10-12 ENCOUNTER — Telehealth: Payer: Self-pay | Admitting: Family Medicine

## 2022-10-12 ENCOUNTER — Encounter: Payer: Self-pay | Admitting: Family Medicine

## 2022-10-12 ENCOUNTER — Other Ambulatory Visit: Payer: Self-pay

## 2022-10-12 VITALS — BP 112/55 | HR 85 | Ht 63.0 in | Wt 153.0 lb

## 2022-10-12 DIAGNOSIS — I10 Essential (primary) hypertension: Secondary | ICD-10-CM

## 2022-10-12 DIAGNOSIS — D509 Iron deficiency anemia, unspecified: Secondary | ICD-10-CM

## 2022-10-12 DIAGNOSIS — I4819 Other persistent atrial fibrillation: Secondary | ICD-10-CM | POA: Diagnosis not present

## 2022-10-12 DIAGNOSIS — E782 Mixed hyperlipidemia: Secondary | ICD-10-CM

## 2022-10-12 DIAGNOSIS — S22080D Wedge compression fracture of T11-T12 vertebra, subsequent encounter for fracture with routine healing: Secondary | ICD-10-CM | POA: Diagnosis not present

## 2022-10-12 LAB — CBC WITH DIFFERENTIAL/PLATELET
Basophils Absolute: 0 10*3/uL (ref 0.0–0.2)
Basos: 0 %
EOS (ABSOLUTE): 0.1 10*3/uL (ref 0.0–0.4)
Eos: 2 %
Hematocrit: 25.8 % — ABNORMAL LOW (ref 34.0–46.6)
Hemoglobin: 8.2 g/dL — CL (ref 11.1–15.9)
Immature Grans (Abs): 0 10*3/uL (ref 0.0–0.1)
Immature Granulocytes: 0 %
Lymphocytes Absolute: 1.2 10*3/uL (ref 0.7–3.1)
Lymphs: 22 %
MCH: 27.2 pg (ref 26.6–33.0)
MCHC: 31.8 g/dL (ref 31.5–35.7)
MCV: 86 fL (ref 79–97)
Monocytes Absolute: 0.9 10*3/uL (ref 0.1–0.9)
Monocytes: 16 %
Neutrophils Absolute: 3.2 10*3/uL (ref 1.4–7.0)
Neutrophils: 60 %
Platelets: 187 10*3/uL (ref 150–450)
RBC: 3.01 x10E6/uL — ABNORMAL LOW (ref 3.77–5.28)
RDW: 16.3 % — ABNORMAL HIGH (ref 11.7–15.4)
WBC: 5.3 10*3/uL (ref 3.4–10.8)

## 2022-10-12 LAB — CMP14+EGFR
ALT: 18 IU/L (ref 0–32)
AST: 20 IU/L (ref 0–40)
Albumin/Globulin Ratio: 2 (ref 1.2–2.2)
Albumin: 4 g/dL (ref 3.6–4.6)
Alkaline Phosphatase: 62 IU/L (ref 44–121)
BUN/Creatinine Ratio: 29 — ABNORMAL HIGH (ref 12–28)
BUN: 35 mg/dL (ref 10–36)
Bilirubin Total: 0.4 mg/dL (ref 0.0–1.2)
CO2: 23 mmol/L (ref 20–29)
Calcium: 9.3 mg/dL (ref 8.7–10.3)
Chloride: 99 mmol/L (ref 96–106)
Creatinine, Ser: 1.22 mg/dL — ABNORMAL HIGH (ref 0.57–1.00)
Globulin, Total: 2 g/dL (ref 1.5–4.5)
Glucose: 117 mg/dL — ABNORMAL HIGH (ref 70–99)
Potassium: 4.6 mmol/L (ref 3.5–5.2)
Sodium: 137 mmol/L (ref 134–144)
Total Protein: 6 g/dL (ref 6.0–8.5)
eGFR: 42 mL/min/{1.73_m2} — ABNORMAL LOW (ref >59)

## 2022-10-12 LAB — HEMOGLOBIN, FINGERSTICK: Hemoglobin: 7.9 g/dL — ABNORMAL LOW (ref 11.1–15.9)

## 2022-10-12 MED ORDER — HYDROCODONE-ACETAMINOPHEN 5-325 MG PO TABS
1.0000 | ORAL_TABLET | Freq: Four times a day (QID) | ORAL | 0 refills | Status: DC | PRN
Start: 2022-10-12 — End: 2022-10-24

## 2022-10-12 NOTE — Progress Notes (Signed)
BP (!) 112/55   Pulse 85   Ht 5\' 3"  (1.6 m)   Wt 153 lb (69.4 kg)   SpO2 (!) 85%   BMI 27.10 kg/m    Subjective:   Patient ID: [name], female    DOB: 1928-10-19, 89 y.o.   MRN: [id number]  HPI: [name] is a 89 y.o. female presenting on 10/12/2022 for Weakness (Request labs today) and Decreased appetite   HPI Weakness and fatigue Patient is coming in for weakness and fatigue and anemia.  She anemia on park with her children think this is because of her decreased appetite.  When asked about abdominal issues or anything, she says the only problem she has currently is constipation and has been having constipation.  She says she has not had a bowel movement in 5 days.  She says she just does not feel hungry.  She does take occasional MiraLAX but does not take it consistently.  T12 compression fracture ER follow-up Patient is coming in for ER follow-up for T12 compression fracture.  She is seeing a neurosurgeon for this.  She has been having the back pain since a fall that she had.  She does feel weak, partially because of anemia but then parcel also because of the back pain issues.  She is taking some hydrocodone.  She says it does help and then use some Tylenol along with it.  Relevant past medical, surgical, family and social history reviewed and updated as indicated. Interim medical history since our last visit reviewed. Allergies and medications reviewed and updated.  Review of Systems  Constitutional:  Positive for appetite change. Negative for chills and fever.  Eyes:  Negative for redness and visual disturbance.  Respiratory:  Negative for chest tightness and shortness of breath.   Cardiovascular:  Negative for chest pain and leg swelling.  Gastrointestinal:  Positive for constipation. Negative for diarrhea, nausea and vomiting.  Musculoskeletal:  Positive for arthralgias and back pain. Negative for gait problem.  Skin:  Negative for rash.  Neurological:   Negative for dizziness, light-headedness and headaches.  Psychiatric/Behavioral:  Negative for agitation and behavioral problems.   All other systems reviewed and are negative.   Per HPI unless specifically indicated above   Allergies as of 10/12/2022       Reactions   Iodinated Contrast Media Hives, Itching, Rash   03/13/14: symptoms began within two hours of intrathecal injection (myelogram 03/11/14); on shoulders radiating down to stomach.  Relief with Benadryl.  Told patient she would need to pre-med with Benadryl in the future before receiving this contrast.  Donell Sievert, RN   Naproxen Hives, Itching, Rash        Medication List        Accurate as of Oct 12, 2022 11:57 AM. If you have any questions, ask your nurse or doctor.          acetaminophen 500 MG tablet Commonly known as: TYLENOL Take 1,000 mg by mouth every 6 (six) hours as needed for mild pain.   amiodarone 200 MG tablet Commonly known as: PACERONE TAKE ONE (1) TABLET EACH DAY   apixaban 5 MG Tabs tablet Commonly known as: Eliquis Take 1 tablet (5 mg total) by mouth 2 (two) times daily.   baclofen 10 MG tablet Commonly known as: LIORESAL Take 0.5 tablets (5 mg total) by mouth 3 (three) times daily.   Benefiber Powd Take 1 Dose by mouth daily as needed (constipation).   calcium-vitamin D 500-200  MG-UNIT tablet Commonly known as: OSCAL WITH D Take 1 tablet by mouth daily with breakfast.   cyanocobalamin 1000 MCG/ML injection Commonly known as: VITAMIN B12 Inject 1 mL (1,000 mcg total) into the muscle every 30 (thirty) days.   ezetimibe 10 MG tablet Commonly known as: ZETIA Take 1 tablet (10 mg total) by mouth daily.   furosemide 40 MG tablet Commonly known as: LASIX TAKE 1 TABLET BY MOUTH DAILY. MAY TAKE AN EXTRA 1/2 TABLET DAILY AS NEEDED FOR LEG SWELLING OR WEIGHT GAIN   HYDROcodone-acetaminophen 5-325 MG tablet Commonly known as: NORCO/VICODIN Take 1 tablet by mouth every 6 (six) hours as  needed. What changed:  how much to take when to take this Changed by: Elige Radon Lando Alcalde, MD   latanoprost 0.005 % ophthalmic solution Commonly known as: XALATAN Place 1 drop into both eyes at bedtime.   lidocaine 5 % ointment Commonly known as: XYLOCAINE Apply 1 Application topically as needed.   multivitamin tablet Take 1 tablet by mouth every morning.   nadolol 40 MG tablet Commonly known as: CORGARD TAKE 1 AND 1/2 TABLETS DAILY   nitroGLYCERIN 0.4 MG SL tablet Commonly known as: NITROSTAT DISSOLVE 1 TAB UNDER TOUNGE FOR CHEST PAIN. MAY REPEAT EVERY 5 MINUTES FOR 3 DOSES. IF NO RELIEF CALL 911 OR GO TO ER What changed: See the new instructions.   nystatin cream Commonly known as: MYCOSTATIN Apply 1 Application topically 2 (two) times daily.   OVER THE COUNTER MEDICATION Take 1 tablet by mouth daily. calcium   pantoprazole 40 MG tablet Commonly known as: PROTONIX TAKE ONE (1) TABLET BY MOUTH EVERY DAY   predniSONE 20 MG tablet Commonly known as: DELTASONE Take 3 tabs daily for 1 week, then 2 tabs daily for week 2, then 1 tab daily for week 3.         Objective:   BP (!) 112/55   Pulse 85   Ht 5\' 3"  (1.6 m)   Wt 153 lb (69.4 kg)   SpO2 (!) 85%   BMI 27.10 kg/m   Wt Readings from Last 3 Encounters:  10/12/22 153 lb (69.4 kg)  10/10/22 145 lb (65.8 kg)  09/15/22 151 lb (68.5 kg)    Physical Exam Vitals and nursing note reviewed.  Constitutional:      General: She is not in acute distress.    Appearance: She is well-developed. She is not diaphoretic.  Eyes:     Conjunctiva/sclera: Conjunctivae normal.  Cardiovascular:     Rate and Rhythm: Normal rate and regular rhythm.     Heart sounds: Normal heart sounds. No murmur heard. Pulmonary:     Effort: Pulmonary effort is normal. No respiratory distress.     Breath sounds: Normal breath sounds. No wheezing.  Abdominal:     General: Abdomen is flat. Bowel sounds are normal. There is no distension.      Tenderness: There is no abdominal tenderness. There is no right CVA tenderness, left CVA tenderness, guarding or rebound.  Musculoskeletal:        General: No swelling or tenderness. Normal range of motion.  Skin:    General: Skin is warm and dry.     Findings: No rash.  Neurological:     Mental Status: She is alert and oriented to person, place, and time.     Coordination: Coordination normal.  Psychiatric:        Behavior: Behavior normal.       Assessment & Plan:   Problem List  Items Addressed This Visit       Cardiovascular and Mediastinum   Essential hypertension, benign (Chronic)   Relevant Orders   CBC with Differential/Platelet   CMP14+EGFR   Hemoglobin, fingerstick   Persistent atrial fibrillation (HCC) - Primary   Relevant Orders   CBC with Differential/Platelet   CMP14+EGFR   Hemoglobin, fingerstick     Other   Hyperlipidemia (Chronic)   Relevant Orders   CBC with Differential/Platelet   CMP14+EGFR   Hemoglobin, fingerstick   Iron deficiency anemia (Chronic)   Relevant Orders   CBC with Differential/Platelet   CMP14+EGFR   Hemoglobin, fingerstick   Fecal occult blood, imunochemical([facility])   Ambulatory referral to Home Health   Other Visit Diagnoses     Compression fracture of T12 vertebra with routine healing, subsequent encounter       Relevant Medications   HYDROcodone-acetaminophen (NORCO/VICODIN) 5-325 MG tablet   Other Relevant Orders   Ambulatory referral to Home Health       Referred patient for home health and therapy at home with compression fracture for strengthening.  Recommended that she do MiraLAX every day to help with her constipation to clear things out so hopefully that can increase her appetite and get her protein and iron levels up.  Hemoglobin looks stable today and was 7.9, will await send out blood work Follow up plan: Return in about 4 weeks (around 11/09/2022), or if symptoms worsen or fail to improve, for  Anemia recheck.  Counseling provided for all of the vaccine components Orders Placed This Encounter  Procedures   Fecal occult blood, imunochemical([facility])   CBC with Differential/Platelet   CMP14+EGFR   Hemoglobin, fingerstick   Ambulatory referral to Home Health    Arville Care, MD [facility] 10/12/2022, 11:57 AM

## 2022-10-17 ENCOUNTER — Telehealth: Payer: Self-pay | Admitting: Family Medicine

## 2022-10-17 NOTE — Telephone Encounter (Signed)
Son said they were told to call back if they had not heard anything about home health and he said they have not been contacted yet. Referral placed on 5/2 and message sent to [facility] 5/6 - no contact info on chart

## 2022-10-18 ENCOUNTER — Telehealth: Payer: Self-pay | Admitting: Family Medicine

## 2022-10-18 DIAGNOSIS — S22080A Wedge compression fracture of T11-T12 vertebra, initial encounter for closed fracture: Secondary | ICD-10-CM | POA: Diagnosis not present

## 2022-10-18 NOTE — Telephone Encounter (Signed)
[name] scheduled for their annual wellness visit. Appointment made for 01/30/2023.  Thank you,  [name],  AMB Clinical Support [facility] AWV Program Direct Dial ??[phone]

## 2022-10-21 ENCOUNTER — Inpatient Hospital Stay: Admit: 2022-10-21 | Payer: Medicare Other | Admitting: Internal Medicine

## 2022-10-21 ENCOUNTER — Encounter (HOSPITAL_COMMUNITY): Payer: Self-pay

## 2022-10-21 DIAGNOSIS — X58XXXA Exposure to other specified factors, initial encounter: Secondary | ICD-10-CM | POA: Diagnosis not present

## 2022-10-21 DIAGNOSIS — Z79899 Other long term (current) drug therapy: Secondary | ICD-10-CM | POA: Diagnosis not present

## 2022-10-21 DIAGNOSIS — G319 Degenerative disease of nervous system, unspecified: Secondary | ICD-10-CM | POA: Diagnosis not present

## 2022-10-21 DIAGNOSIS — M545 Low back pain, unspecified: Secondary | ICD-10-CM | POA: Diagnosis not present

## 2022-10-21 DIAGNOSIS — I480 Paroxysmal atrial fibrillation: Secondary | ICD-10-CM | POA: Diagnosis not present

## 2022-10-21 DIAGNOSIS — J320 Chronic maxillary sinusitis: Secondary | ICD-10-CM | POA: Diagnosis not present

## 2022-10-21 DIAGNOSIS — D649 Anemia, unspecified: Secondary | ICD-10-CM | POA: Diagnosis not present

## 2022-10-21 DIAGNOSIS — S22089A Unspecified fracture of T11-T12 vertebra, initial encounter for closed fracture: Secondary | ICD-10-CM | POA: Diagnosis not present

## 2022-10-21 DIAGNOSIS — J9811 Atelectasis: Secondary | ICD-10-CM | POA: Diagnosis not present

## 2022-10-21 DIAGNOSIS — W19XXXA Unspecified fall, initial encounter: Secondary | ICD-10-CM | POA: Diagnosis not present

## 2022-10-21 DIAGNOSIS — I10 Essential (primary) hypertension: Secondary | ICD-10-CM | POA: Diagnosis not present

## 2022-10-21 DIAGNOSIS — Z743 Need for continuous supervision: Secondary | ICD-10-CM | POA: Diagnosis not present

## 2022-10-21 DIAGNOSIS — S22089D Unspecified fracture of T11-T12 vertebra, subsequent encounter for fracture with routine healing: Secondary | ICD-10-CM | POA: Diagnosis not present

## 2022-10-21 DIAGNOSIS — K5900 Constipation, unspecified: Secondary | ICD-10-CM | POA: Diagnosis not present

## 2022-10-21 DIAGNOSIS — S3991XA Unspecified injury of abdomen, initial encounter: Secondary | ICD-10-CM | POA: Diagnosis not present

## 2022-10-21 DIAGNOSIS — E871 Hypo-osmolality and hyponatremia: Secondary | ICD-10-CM | POA: Diagnosis not present

## 2022-10-21 DIAGNOSIS — R918 Other nonspecific abnormal finding of lung field: Secondary | ICD-10-CM | POA: Diagnosis not present

## 2022-10-21 DIAGNOSIS — D509 Iron deficiency anemia, unspecified: Secondary | ICD-10-CM | POA: Diagnosis not present

## 2022-10-21 DIAGNOSIS — S3993XA Unspecified injury of pelvis, initial encounter: Secondary | ICD-10-CM | POA: Diagnosis not present

## 2022-10-21 DIAGNOSIS — Z7901 Long term (current) use of anticoagulants: Secondary | ICD-10-CM | POA: Diagnosis not present

## 2022-10-21 DIAGNOSIS — E860 Dehydration: Secondary | ICD-10-CM | POA: Diagnosis not present

## 2022-10-23 ENCOUNTER — Telehealth: Payer: Self-pay | Admitting: Family Medicine

## 2022-10-23 NOTE — Telephone Encounter (Signed)
Son says that pt was prescribed Hydrocodone for pain but is having a lot of side affects from taking it. Says she is sleepy, weak, and has no appetite. The family is trying to get pts energy/strength/mobility level back up but this medicine is not helping. Wants to know if there is an alternative medicine pt can be prescribed?

## 2022-10-23 NOTE — Telephone Encounter (Signed)
[name]  

## 2022-10-23 NOTE — Telephone Encounter (Signed)
PT can take OTC tylenol every 8 hours.

## 2022-10-24 NOTE — Telephone Encounter (Signed)
Pt will have to follow up with PCP. Ultram is stronger than tylenol but can not take with her amiodarone. She could try 1/2 of the Norco.

## 2022-10-24 NOTE — Addendum Note (Signed)
Addended by: Jannifer Rodney A on: 10/24/2022 01:27 PM   Modules accepted: Orders

## 2022-10-24 NOTE — Telephone Encounter (Signed)
Wants to know if anything else can be recommended that is stronger than tylenol because pt is in a lot of pain?  Call back # [phone]

## 2022-10-24 NOTE — Telephone Encounter (Signed)
Patient aware and verbalized understanding. °

## 2022-10-26 ENCOUNTER — Telehealth: Payer: Self-pay | Admitting: Family Medicine

## 2022-10-26 ENCOUNTER — Other Ambulatory Visit: Payer: Self-pay | Admitting: Nurse Practitioner

## 2022-10-26 DIAGNOSIS — I4819 Other persistent atrial fibrillation: Secondary | ICD-10-CM | POA: Diagnosis not present

## 2022-10-26 DIAGNOSIS — Z9181 History of falling: Secondary | ICD-10-CM | POA: Diagnosis not present

## 2022-10-26 DIAGNOSIS — Z950 Presence of cardiac pacemaker: Secondary | ICD-10-CM | POA: Diagnosis not present

## 2022-10-26 DIAGNOSIS — D509 Iron deficiency anemia, unspecified: Secondary | ICD-10-CM | POA: Diagnosis not present

## 2022-10-26 DIAGNOSIS — E785 Hyperlipidemia, unspecified: Secondary | ICD-10-CM | POA: Diagnosis not present

## 2022-10-26 DIAGNOSIS — S22088D Other fracture of T11-T12 vertebra, subsequent encounter for fracture with routine healing: Secondary | ICD-10-CM | POA: Diagnosis not present

## 2022-10-26 DIAGNOSIS — Z7901 Long term (current) use of anticoagulants: Secondary | ICD-10-CM | POA: Diagnosis not present

## 2022-10-26 DIAGNOSIS — I10 Essential (primary) hypertension: Secondary | ICD-10-CM | POA: Diagnosis not present

## 2022-10-26 MED ORDER — MEGESTROL ACETATE 20 MG PO TABS: 20.0000 mg | ORAL_TABLET | Freq: Two times a day (BID) | ORAL | 2 refills | Status: DC

## 2022-10-26 NOTE — Telephone Encounter (Signed)
Will send in prescription for megace to take with breakfast and lunch

## 2022-10-26 NOTE — Telephone Encounter (Signed)
Randy aware and verbalized understanding

## 2022-10-27 DIAGNOSIS — I10 Essential (primary) hypertension: Secondary | ICD-10-CM | POA: Diagnosis not present

## 2022-10-27 DIAGNOSIS — Z7901 Long term (current) use of anticoagulants: Secondary | ICD-10-CM | POA: Diagnosis not present

## 2022-10-27 DIAGNOSIS — D509 Iron deficiency anemia, unspecified: Secondary | ICD-10-CM | POA: Diagnosis not present

## 2022-10-27 DIAGNOSIS — I4819 Other persistent atrial fibrillation: Secondary | ICD-10-CM | POA: Diagnosis not present

## 2022-10-27 DIAGNOSIS — Z9181 History of falling: Secondary | ICD-10-CM | POA: Diagnosis not present

## 2022-10-27 DIAGNOSIS — Z950 Presence of cardiac pacemaker: Secondary | ICD-10-CM | POA: Diagnosis not present

## 2022-10-27 DIAGNOSIS — E785 Hyperlipidemia, unspecified: Secondary | ICD-10-CM | POA: Diagnosis not present

## 2022-10-27 DIAGNOSIS — S22088D Other fracture of T11-T12 vertebra, subsequent encounter for fracture with routine healing: Secondary | ICD-10-CM | POA: Diagnosis not present

## 2022-10-30 NOTE — Telephone Encounter (Signed)
Erroneous encounter will close.

## 2022-10-31 ENCOUNTER — Telehealth: Payer: Self-pay | Admitting: Cardiology

## 2022-10-31 DIAGNOSIS — E785 Hyperlipidemia, unspecified: Secondary | ICD-10-CM | POA: Diagnosis not present

## 2022-10-31 DIAGNOSIS — I10 Essential (primary) hypertension: Secondary | ICD-10-CM | POA: Diagnosis not present

## 2022-10-31 DIAGNOSIS — S22088D Other fracture of T11-T12 vertebra, subsequent encounter for fracture with routine healing: Secondary | ICD-10-CM | POA: Diagnosis not present

## 2022-10-31 DIAGNOSIS — D509 Iron deficiency anemia, unspecified: Secondary | ICD-10-CM | POA: Diagnosis not present

## 2022-10-31 DIAGNOSIS — I4819 Other persistent atrial fibrillation: Secondary | ICD-10-CM | POA: Diagnosis not present

## 2022-10-31 DIAGNOSIS — Z950 Presence of cardiac pacemaker: Secondary | ICD-10-CM | POA: Diagnosis not present

## 2022-10-31 DIAGNOSIS — Z9181 History of falling: Secondary | ICD-10-CM | POA: Diagnosis not present

## 2022-10-31 DIAGNOSIS — Z7901 Long term (current) use of anticoagulants: Secondary | ICD-10-CM | POA: Diagnosis not present

## 2022-10-31 NOTE — Telephone Encounter (Signed)
Spoke with son - concerned about her weight gain.  Had recent ED visit for dehydration on 10/21/2022.  Also, was constipated & they treated for that as well.  Weight today is 153.5, normally her baseline is 149-150.  BP running 118/70 & sat 98%.  HR running 64 yesterday.  No c/o chest pain but does note increased SOB with activity.  Is also currently taking Hydrocodone for compression fracture to her back.

## 2022-10-31 NOTE — Telephone Encounter (Signed)
Pt c/o medication issue:  1. Name of Medication: furosemide (LASIX) 40 MG tablet   2. How are you currently taking this medication (dosage and times per day)? TAKE 1 TABLET BY MOUTH DAILY. MAY TAKE AN EXTRA 1/2 TABLET DAILY AS NEEDED FOR LEG SWELLING OR WEIGHT GAIN   3. Are you having a reaction (difficulty breathing--STAT)? No  4. What is your medication issue? Patient's son is calling because the patient has been taking 1 1/2 tablets for the swelling. Patient's son stated the patient still has fluid in her calfs and feet. Patient's son stated the home health nurse could still hear fluid in the lower part of her lungs. Patient's son would like to know if the patient should increase the dosage or if they should go to the hospital for them to relieve the fluid.

## 2022-11-01 ENCOUNTER — Encounter: Payer: Self-pay | Admitting: *Deleted

## 2022-11-01 DIAGNOSIS — S22088D Other fracture of T11-T12 vertebra, subsequent encounter for fracture with routine healing: Secondary | ICD-10-CM | POA: Diagnosis not present

## 2022-11-01 DIAGNOSIS — Z7901 Long term (current) use of anticoagulants: Secondary | ICD-10-CM | POA: Diagnosis not present

## 2022-11-01 DIAGNOSIS — I4819 Other persistent atrial fibrillation: Secondary | ICD-10-CM | POA: Diagnosis not present

## 2022-11-01 DIAGNOSIS — I10 Essential (primary) hypertension: Secondary | ICD-10-CM | POA: Diagnosis not present

## 2022-11-01 DIAGNOSIS — D509 Iron deficiency anemia, unspecified: Secondary | ICD-10-CM | POA: Diagnosis not present

## 2022-11-01 DIAGNOSIS — Z950 Presence of cardiac pacemaker: Secondary | ICD-10-CM | POA: Diagnosis not present

## 2022-11-01 DIAGNOSIS — Z9181 History of falling: Secondary | ICD-10-CM | POA: Diagnosis not present

## 2022-11-01 DIAGNOSIS — E785 Hyperlipidemia, unspecified: Secondary | ICD-10-CM | POA: Diagnosis not present

## 2022-11-01 MED ORDER — FUROSEMIDE 40 MG PO TABS: 40.0000 mg | ORAL_TABLET | Freq: Every day | ORAL | 1 refills | Status: DC

## 2022-11-01 NOTE — Telephone Encounter (Signed)
Patient son returning a he just missed.

## 2022-11-01 NOTE — Telephone Encounter (Signed)
Can increase as needed lasix to 2 tablets (80mg ) as needed, needs appt with Philis Nettle please  J Fantasha Daniele MD

## 2022-11-01 NOTE — Telephone Encounter (Signed)
Son wants a call back to discuss next steps as patient's fluid is still up.

## 2022-11-01 NOTE — Telephone Encounter (Signed)
Patient and Ihor Dow informed and verbalized understanding of plan.

## 2022-11-02 ENCOUNTER — Inpatient Hospital Stay (HOSPITAL_COMMUNITY)
Admission: EM | Admit: 2022-11-02 | Discharge: 2022-11-05 | DRG: 291 | Disposition: A | Discharge status: Home Health | Payer: Medicare Other | Attending: Family Medicine | Admitting: Family Medicine

## 2022-11-02 ENCOUNTER — Emergency Department (HOSPITAL_COMMUNITY): Payer: Medicare Other

## 2022-11-02 ENCOUNTER — Telehealth: Payer: Self-pay | Admitting: Cardiology

## 2022-11-02 ENCOUNTER — Other Ambulatory Visit: Payer: Self-pay

## 2022-11-02 ENCOUNTER — Encounter (HOSPITAL_COMMUNITY): Payer: Self-pay

## 2022-11-02 DIAGNOSIS — Z79899 Other long term (current) drug therapy: Secondary | ICD-10-CM

## 2022-11-02 DIAGNOSIS — D631 Anemia in chronic kidney disease: Secondary | ICD-10-CM | POA: Diagnosis present

## 2022-11-02 DIAGNOSIS — I10 Essential (primary) hypertension: Secondary | ICD-10-CM | POA: Diagnosis not present

## 2022-11-02 DIAGNOSIS — I509 Heart failure, unspecified: Principal | ICD-10-CM

## 2022-11-02 DIAGNOSIS — Z823 Family history of stroke: Secondary | ICD-10-CM

## 2022-11-02 DIAGNOSIS — R0689 Other abnormalities of breathing: Secondary | ICD-10-CM | POA: Diagnosis not present

## 2022-11-02 DIAGNOSIS — R0602 Shortness of breath: Secondary | ICD-10-CM | POA: Diagnosis not present

## 2022-11-02 DIAGNOSIS — E871 Hypo-osmolality and hyponatremia: Secondary | ICD-10-CM | POA: Diagnosis not present

## 2022-11-02 DIAGNOSIS — I5043 Acute on chronic combined systolic (congestive) and diastolic (congestive) heart failure: Secondary | ICD-10-CM | POA: Diagnosis present

## 2022-11-02 DIAGNOSIS — J9811 Atelectasis: Secondary | ICD-10-CM | POA: Diagnosis not present

## 2022-11-02 DIAGNOSIS — I4819 Other persistent atrial fibrillation: Secondary | ICD-10-CM | POA: Diagnosis present

## 2022-11-02 DIAGNOSIS — R069 Unspecified abnormalities of breathing: Secondary | ICD-10-CM | POA: Diagnosis not present

## 2022-11-02 DIAGNOSIS — E876 Hypokalemia: Secondary | ICD-10-CM | POA: Diagnosis not present

## 2022-11-02 DIAGNOSIS — Z86718 Personal history of other venous thrombosis and embolism: Secondary | ICD-10-CM | POA: Diagnosis not present

## 2022-11-02 DIAGNOSIS — E785 Hyperlipidemia, unspecified: Secondary | ICD-10-CM | POA: Diagnosis not present

## 2022-11-02 DIAGNOSIS — M810 Age-related osteoporosis without current pathological fracture: Secondary | ICD-10-CM | POA: Diagnosis present

## 2022-11-02 DIAGNOSIS — Z96651 Presence of right artificial knee joint: Secondary | ICD-10-CM | POA: Diagnosis not present

## 2022-11-02 DIAGNOSIS — Z950 Presence of cardiac pacemaker: Secondary | ICD-10-CM

## 2022-11-02 DIAGNOSIS — Z800 Family history of malignant neoplasm of digestive organs: Secondary | ICD-10-CM | POA: Diagnosis not present

## 2022-11-02 DIAGNOSIS — R6889 Other general symptoms and signs: Secondary | ICD-10-CM | POA: Diagnosis not present

## 2022-11-02 DIAGNOSIS — K219 Gastro-esophageal reflux disease without esophagitis: Secondary | ICD-10-CM | POA: Diagnosis present

## 2022-11-02 DIAGNOSIS — I272 Pulmonary hypertension, unspecified: Secondary | ICD-10-CM | POA: Diagnosis not present

## 2022-11-02 DIAGNOSIS — Z91041 Radiographic dye allergy status: Secondary | ICD-10-CM

## 2022-11-02 DIAGNOSIS — S22088D Other fracture of T11-T12 vertebra, subsequent encounter for fracture with routine healing: Secondary | ICD-10-CM | POA: Diagnosis not present

## 2022-11-02 DIAGNOSIS — Z8249 Family history of ischemic heart disease and other diseases of the circulatory system: Secondary | ICD-10-CM | POA: Diagnosis not present

## 2022-11-02 DIAGNOSIS — N1832 Chronic kidney disease, stage 3b: Secondary | ICD-10-CM | POA: Diagnosis not present

## 2022-11-02 DIAGNOSIS — Z833 Family history of diabetes mellitus: Secondary | ICD-10-CM

## 2022-11-02 DIAGNOSIS — I130 Hypertensive heart and chronic kidney disease with heart failure and stage 1 through stage 4 chronic kidney disease, or unspecified chronic kidney disease: Principal | ICD-10-CM | POA: Diagnosis present

## 2022-11-02 DIAGNOSIS — Z743 Need for continuous supervision: Secondary | ICD-10-CM | POA: Diagnosis not present

## 2022-11-02 DIAGNOSIS — E782 Mixed hyperlipidemia: Secondary | ICD-10-CM | POA: Diagnosis present

## 2022-11-02 DIAGNOSIS — Z8042 Family history of malignant neoplasm of prostate: Secondary | ICD-10-CM

## 2022-11-02 DIAGNOSIS — I495 Sick sinus syndrome: Secondary | ICD-10-CM | POA: Diagnosis not present

## 2022-11-02 DIAGNOSIS — Z808 Family history of malignant neoplasm of other organs or systems: Secondary | ICD-10-CM

## 2022-11-02 DIAGNOSIS — H409 Unspecified glaucoma: Secondary | ICD-10-CM | POA: Diagnosis not present

## 2022-11-02 DIAGNOSIS — Z853 Personal history of malignant neoplasm of breast: Secondary | ICD-10-CM

## 2022-11-02 DIAGNOSIS — Z7901 Long term (current) use of anticoagulants: Secondary | ICD-10-CM

## 2022-11-02 DIAGNOSIS — D509 Iron deficiency anemia, unspecified: Secondary | ICD-10-CM | POA: Diagnosis not present

## 2022-11-02 DIAGNOSIS — I5033 Acute on chronic diastolic (congestive) heart failure: Secondary | ICD-10-CM | POA: Diagnosis not present

## 2022-11-02 DIAGNOSIS — E538 Deficiency of other specified B group vitamins: Secondary | ICD-10-CM | POA: Diagnosis not present

## 2022-11-02 DIAGNOSIS — Z886 Allergy status to analgesic agent status: Secondary | ICD-10-CM

## 2022-11-02 DIAGNOSIS — I110 Hypertensive heart disease with heart failure: Secondary | ICD-10-CM | POA: Diagnosis not present

## 2022-11-02 DIAGNOSIS — Z820 Family history of epilepsy and other diseases of the nervous system: Secondary | ICD-10-CM

## 2022-11-02 DIAGNOSIS — Z9181 History of falling: Secondary | ICD-10-CM | POA: Diagnosis not present

## 2022-11-02 LAB — BASIC METABOLIC PANEL
Anion gap: 10 (ref 5–15)
BUN: 28 mg/dL — ABNORMAL HIGH (ref 8–23)
CO2: 28 mmol/L (ref 22–32)
Calcium: 8.8 mg/dL — ABNORMAL LOW (ref 8.9–10.3)
Chloride: 94 mmol/L — ABNORMAL LOW (ref 98–111)
Creatinine, Ser: 1.09 mg/dL — ABNORMAL HIGH (ref 0.44–1.00)
GFR, Estimated: 48 mL/min — ABNORMAL LOW (ref >60)
Glucose, Bld: 110 mg/dL — ABNORMAL HIGH (ref 70–99)
Potassium: 3.7 mmol/L (ref 3.5–5.1)
Sodium: 132 mmol/L — ABNORMAL LOW (ref 135–145)

## 2022-11-02 LAB — CBC WITH DIFFERENTIAL/PLATELET
Abs Immature Granulocytes: 0.02 10*3/uL (ref 0.00–0.07)
Basophils Absolute: 0 10*3/uL (ref 0.0–0.1)
Basophils Relative: 0 %
Eosinophils Absolute: 0.2 10*3/uL (ref 0.0–0.5)
Eosinophils Relative: 3 %
HCT: 25 % — ABNORMAL LOW (ref 36.0–46.0)
Hemoglobin: 7.5 g/dL — ABNORMAL LOW (ref 12.0–15.0)
Immature Granulocytes: 0 %
Lymphocytes Relative: 23 %
Lymphs Abs: 1.1 10*3/uL (ref 0.7–4.0)
MCH: 25.3 pg — ABNORMAL LOW (ref 26.0–34.0)
MCHC: 30 g/dL (ref 30.0–36.0)
MCV: 84.5 fL (ref 80.0–100.0)
Monocytes Absolute: 0.9 10*3/uL (ref 0.1–1.0)
Monocytes Relative: 17 %
Neutro Abs: 2.7 10*3/uL (ref 1.7–7.7)
Neutrophils Relative %: 57 %
Platelets: 141 10*3/uL — ABNORMAL LOW (ref 150–400)
RBC: 2.96 MIL/uL — ABNORMAL LOW (ref 3.87–5.11)
RDW: 17.5 % — ABNORMAL HIGH (ref 11.5–15.5)
WBC: 4.9 10*3/uL (ref 4.0–10.5)
nRBC: 0 % (ref 0.0–0.2)

## 2022-11-02 LAB — PREPARE RBC (CROSSMATCH)

## 2022-11-02 LAB — TYPE AND SCREEN

## 2022-11-02 LAB — BRAIN NATRIURETIC PEPTIDE: B Natriuretic Peptide: 829 pg/mL — ABNORMAL HIGH (ref 0.0–100.0)

## 2022-11-02 MED ORDER — FUROSEMIDE 10 MG/ML IJ SOLN
40.0000 mg | Freq: Once | INTRAMUSCULAR | Status: AC
  Administered 2022-11-02: 40 mg via INTRAVENOUS
  Filled 2022-11-02: qty 4

## 2022-11-02 MED ORDER — APIXABAN 5 MG PO TABS
5.0000 mg | ORAL_TABLET | Freq: Two times a day (BID) | ORAL | Status: DC
  Administered 2022-11-02 – 2022-11-05 (×6): 5 mg via ORAL
  Filled 2022-11-02 (×6): qty 1

## 2022-11-02 MED ORDER — NADOLOL 40 MG PO TABS
60.0000 mg | ORAL_TABLET | Freq: Every day | ORAL | Status: DC
  Administered 2022-11-03 – 2022-11-04 (×2): 60 mg via ORAL
  Filled 2022-11-02 (×4): qty 1

## 2022-11-02 MED ORDER — PANTOPRAZOLE SODIUM 40 MG PO TBEC
40.0000 mg | DELAYED_RELEASE_TABLET | Freq: Every day | ORAL | Status: DC
  Administered 2022-11-03 – 2022-11-04 (×2): 40 mg via ORAL
  Filled 2022-11-02 (×3): qty 1

## 2022-11-02 MED ORDER — SODIUM CHLORIDE 0.9% IV SOLUTION: Freq: Once | INTRAVENOUS | Status: AC

## 2022-11-02 MED ORDER — EZETIMIBE 10 MG PO TABS
10.0000 mg | ORAL_TABLET | Freq: Every day | ORAL | Status: DC
  Administered 2022-11-03 – 2022-11-05 (×3): 10 mg via ORAL
  Filled 2022-11-02 (×3): qty 1

## 2022-11-02 MED ORDER — AMIODARONE HCL 200 MG PO TABS
200.0000 mg | ORAL_TABLET | Freq: Every day | ORAL | Status: DC
  Administered 2022-11-03 – 2022-11-05 (×3): 200 mg via ORAL
  Filled 2022-11-02 (×3): qty 1

## 2022-11-02 MED ORDER — FUROSEMIDE 10 MG/ML IJ SOLN
40.0000 mg | Freq: Once | INTRAMUSCULAR | Status: AC
  Administered 2022-11-03: 40 mg via INTRAVENOUS
  Filled 2022-11-02: qty 4

## 2022-11-02 MED ORDER — ACETAMINOPHEN 325 MG PO TABS
650.0000 mg | ORAL_TABLET | Freq: Four times a day (QID) | ORAL | Status: DC | PRN
  Administered 2022-11-04 – 2022-11-05 (×2): 650 mg via ORAL
  Filled 2022-11-02 (×2): qty 2

## 2022-11-02 MED ORDER — LATANOPROST 0.005 % OP SOLN
1.0000 [drp] | Freq: Every day | OPHTHALMIC | Status: DC
  Administered 2022-11-02 – 2022-11-04 (×3): 1 [drp] via OPHTHALMIC
  Filled 2022-11-02: qty 2.5

## 2022-11-02 MED ORDER — FUROSEMIDE 10 MG/ML IJ SOLN
40.0000 mg | Freq: Once | INTRAMUSCULAR | Status: DC
Start: 2022-11-02 — End: 2022-11-02

## 2022-11-02 MED ORDER — SODIUM CHLORIDE 0.9% FLUSH
3.0000 mL | Freq: Two times a day (BID) | INTRAVENOUS | Status: DC
  Administered 2022-11-02 – 2022-11-05 (×5): 3 mL via INTRAVENOUS

## 2022-11-02 MED ORDER — SODIUM CHLORIDE 0.9 % IV SOLN: 250.0000 mL | INTRAVENOUS | Status: DC | PRN

## 2022-11-02 MED ORDER — SODIUM CHLORIDE 0.9% FLUSH: 3.0000 mL | INTRAVENOUS | Status: DC | PRN

## 2022-11-02 MED ORDER — BENEFIBER PO POWD: 1.0000 | Freq: Every day | ORAL | Status: DC | PRN

## 2022-11-02 NOTE — Telephone Encounter (Signed)
Spoke with OT with [facility] who reports swelling and SOB is worse today than on Monday. Patient reported worsening SOB today. OT reports patient was very fatigue with exercises and walking short distances. O2 Sat 93% on room air. BP yesterday was 118/70 HR 64 & today BP 104/70 & HR 62. Weights have been taken, see below. 05/20 153.5 lbs 05/22 156 lbs 05/23 156.5 lbs Medications reviewed-did start 80 mg lasix yesterday and took 80 mg lasix today.  Contacted patient and advised to go to ED if she develops worsening symptoms. Will send to provider for review. Verbalized understanding of plan.

## 2022-11-02 NOTE — Telephone Encounter (Signed)
Pt c/o swelling: STAT is pt has developed SOB within 24 hours  If swelling, where is the swelling located?   Both lower extremities  How much weight have you gained and in what time span?  1/2 lb overnight  Have you gained 3 pounds in a day or 5 pounds in a week?   Do you have a log of your daily weights (if so, list)?   Yes  Are you currently taking a fluid pill?   Yes  Are you currently SOB?   Yes.  Oxygen is within normal range but gets SOB very easily  Have you traveled recently?   No   Caller reports patient had her Lasik increased to twice daily but patient is more swollen and has had a 1/2 lb weight gain overnight.  Patient is still having SOB.

## 2022-11-02 NOTE — Telephone Encounter (Signed)
Spoke to son who stated that EMS transported pt to hospital for evaluation

## 2022-11-02 NOTE — ED Provider Notes (Signed)
[place] EMERGENCY DEPARTMENT AT [facility] Provider Note   CSN: [id number] Arrival date & time: 11/02/22  1551     History  Chief Complaint  Patient presents with   Shortness of Breath    CHF    [name] is a 89 y.o. female.  HPI    89 y.o. female with history of HFpEF, PAF, sick sinus syndrome-s/p Pacemaker, VTE-presents to the emergency room with chief complaint of shortness of breath, increased leg swelling.  Patient accompanied by her son, provides substantial part of the history.  According to the son, patient has been having increasing swelling over the last few days.  Patient's dry weight is 149, she weighed 157 yesterday.  They called the cardiologist, who recommended that we double the Lasix, which they did, but patient continues to have increasing shortness of breath.  Patient states that she has shortness of breath at rest.  Over the last few days she has not been laying flat regardless because of compression fracture to her spine.  She does feel like there is fluid building up in her lungs.  She has home health nurse, also feels the same.   Home Medications Prior to Admission medications   Medication Sig Start Date End Date Taking? Authorizing Provider  acetaminophen (TYLENOL) 500 MG tablet Take 1,000 mg by mouth every 6 (six) hours as needed for mild pain.   Yes [provider]  amiodarone (PACERONE) 200 MG tablet TAKE ONE (1) TABLET EACH DAY Patient taking differently: Take 200 mg by mouth daily. 07/31/22  Yes Dettinger, Elige Radon, MD  apixaban (ELIQUIS) 5 MG TABS tablet Take 1 tablet (5 mg total) by mouth 2 (two) times daily. 10/04/22  Yes Dettinger, Elige Radon, MD  calcium-vitamin D (OSCAL WITH D) 500-200 MG-UNIT tablet Take 1 tablet by mouth daily with breakfast.    Yes [provider]  ezetimibe (ZETIA) 10 MG tablet Take 1 tablet (10 mg total) by mouth daily. 08/08/22  Yes Jonelle Sidle, MD  furosemide (LASIX) 40 MG tablet  Take 1 tablet (40 mg total) by mouth daily. May take 2 tablets (80mg ) daily as needed 11/01/22  Yes Branch, Dorothe Pea, MD  HYDROcodone-acetaminophen (NORCO/VICODIN) 5-325 MG tablet Take 1 tablet by mouth every 6 (six) hours as needed for moderate pain. 10/18/22  Yes [provider]  latanoprost (XALATAN) 0.005 % ophthalmic solution Place 1 drop into both eyes at bedtime.   Yes [provider]  megestrol (MEGACE) 20 MG tablet Take 1 tablet (20 mg total) by mouth 2 (two) times daily. With breakfast and lunch 10/26/22  Yes Daphine Deutscher, Mary-Margaret, FNP  Multiple Vitamin (MULTIVITAMIN) tablet Take 1 tablet by mouth every morning.   Yes [provider]  nadolol (CORGARD) 40 MG tablet TAKE 1 AND 1/2 TABLETS DAILY 09/26/22  Yes Dettinger, Elige Radon, MD  nitroGLYCERIN (NITROSTAT) 0.4 MG SL tablet DISSOLVE 1 TAB UNDER TOUNGE FOR CHEST PAIN. MAY REPEAT EVERY 5 MINUTES FOR 3 DOSES. IF NO RELIEF CALL 911 OR GO TO ER Patient taking differently: Place 0.4 mg under the tongue every 5 (five) minutes as needed for chest pain. 09/10/20  Yes Jonelle Sidle, MD  pantoprazole (PROTONIX) 40 MG tablet TAKE ONE (1) TABLET BY MOUTH EVERY DAY 07/31/22  Yes Dettinger, Elige Radon, MD  Wheat Dextrin (BENEFIBER) POWD Take 1 Dose by mouth daily as needed (constipation).   Yes [provider]  baclofen (LIORESAL) 10 MG tablet Take 0.5 tablets (5 mg total) by  mouth 3 (three) times daily. Patient not taking: Reported on 11/02/2022 08/24/22   Jannifer Rodney A, FNP  cyanocobalamin (VITAMIN B12) 1000 MCG/ML injection Inject 1 mL (1,000 mcg total) into the muscle every 30 (thirty) days. 04/10/22   Dettinger, Elige Radon, MD  lidocaine (XYLOCAINE) 5 % ointment Apply 1 Application topically as needed. Patient not taking: Reported on 11/02/2022 09/21/22   Dettinger, Elige Radon, MD  nystatin cream (MYCOSTATIN) Apply 1 Application topically 2 (two) times daily. Patient not taking: Reported on 11/02/2022 07/26/22   Gabriel Earing, FNP  OVER THE COUNTER MEDICATION Take 1 tablet by mouth daily. calcium Patient not taking: Reported on 11/02/2022    [provider]      Allergies    Naproxen and Iodinated contrast media    Review of Systems   Review of Systems  All other systems reviewed and are negative.   Physical Exam Updated Vital Signs BP (!) 111/55   Pulse 60   Temp 98.5 F (36.9 C) (Oral)   Resp 17   Ht 5\' 3"  (1.6 m)   Wt 69.4 kg   SpO2 98%   BMI 27.10 kg/m  Physical Exam Vitals and nursing note reviewed.  Constitutional:      Appearance: She is well-developed.  HENT:     Head: Atraumatic.  Cardiovascular:     Rate and Rhythm: Normal rate.  Pulmonary:     Effort: Pulmonary effort is normal.     Breath sounds: Examination of the right-lower field reveals rales. Examination of the left-lower field reveals rales. Rales present. No decreased breath sounds or rhonchi.  Musculoskeletal:     Cervical back: Normal range of motion and neck supple.     Right lower leg: Edema present.     Left lower leg: Edema present.     Comments: 3+ pitting edema bilateral lower extremity  Skin:    General: Skin is warm and dry.  Neurological:     Mental Status: She is alert and oriented to person, place, and time.     ED Results / Procedures / Treatments   Labs (all labs ordered are listed, but only abnormal results are displayed) Labs Reviewed  BASIC METABOLIC PANEL - Abnormal; Notable for the following components:      Result Value   Sodium 132 (*)    Chloride 94 (*)    Glucose, Bld 110 (*)    BUN 28 (*)    Creatinine, Ser 1.09 (*)    Calcium 8.8 (*)    GFR, Estimated 48 (*)    All other components within normal limits  CBC WITH DIFFERENTIAL/PLATELET - Abnormal; Notable for the following components:   RBC 2.96 (*)    Hemoglobin 7.5 (*)    HCT 25.0 (*)    MCH 25.3 (*)    RDW 17.5 (*)    Platelets 141 (*)    All other components within normal limits  BRAIN NATRIURETIC PEPTIDE     EKG EKG Interpretation  Date/Time:  Thursday Nov 02 2022 16:19:44 EDT Ventricular Rate:  60 PR Interval:  368 QRS Duration: 195 QT Interval:  528 QTC Calculation: 528 R Axis:   -70 Text Interpretation: Atrial-ventricular dual-paced rhythm No further analysis attempted due to paced rhythm No acute changes Confirmed by Derwood Kaplan ([id number]) on 11/02/2022 6:21:36 PM  Radiology DG Chest Port 1 View  Result Date: 11/02/2022 CLINICAL DATA:  Shortness of breath. EXAM: PORTABLE CHEST 1 VIEW COMPARISON:  Oct 21, 2022 FINDINGS: A multi  lead AICD is noted with stable lead positioning. The cardiac silhouette is mildly enlarged and unchanged in size. Mild, diffuse, chronic appearing increased interstitial lung markings are noted. A trace amount of residual atelectasis is seen within the left lung base. There is no evidence of acute infiltrate, pleural effusion or pneumothorax. Multilevel degenerative changes seen throughout the thoracic spine. IMPRESSION: Chronic appearing increased interstitial lung markings with a trace amount of residual left basilar atelectasis. Electronically Signed   By: Aram Candela M.D.   On: 11/02/2022 17:57    Procedures Procedures    Medications Ordered in ED Medications  furosemide (LASIX) injection 40 mg (has no administration in time range)    ED Course/ Medical Decision Making/ A&P                             Medical Decision Making Amount and/or Complexity of Data Reviewed Labs: ordered. Radiology: ordered.  Risk Prescription drug management. Decision regarding hospitalization.   89 year old female comes in with chief complaint of shortness of breath, increased weight gain.  She has known history of CHF with preserved EF, sick sinus syndrome with pacemaker in place.  On exam patient has pitting edema and some rales over the base of the lung.  Differential diagnosis for this patient includes pulmonary edema, pleural effusion, CHF  exacerbation, pulmonary embolism, symptomatic A-fib, decompensation of A-fib.  Exam is overall reassuring besides pitting edema.  She does have bibasilar rales.  No hypoxia, no tachypnea.  Family indicated that they have already increased her Lasix yesterday, with no significant improvement.  Best next step for her will be admission to the hospital.  Will give her IV 40 mg Lasix now.  Basic labs, BNP ordered.  I reviewed patient's previous echocardiogram, she has preserved EF. She is already on apixaban, lower suspicion for PE to begin with, and apixaban reduces the possibility even further.  Final Clinical Impression(s) / ED Diagnoses Final diagnoses:  Acute congestive heart failure, unspecified heart failure type [facility])    Rx / DC Orders ED Discharge Orders     None         Derwood Kaplan, MD 11/02/22 1835

## 2022-11-02 NOTE — ED Notes (Signed)
Pt does have a pace maker.

## 2022-11-02 NOTE — Telephone Encounter (Signed)
Left a message for pt's son to return call to the office.

## 2022-11-02 NOTE — Telephone Encounter (Signed)
Pt presented to [facility] ED

## 2022-11-02 NOTE — H&P (Signed)
TRH H&P   Patient Demographics:    [name], is a 89 y.o. female  MRN: [id number]   DOB - 1929/05/02  Admit Date - 11/02/2022  Outpatient Primary MD for the patient is Dettinger, Elige Radon, MD  Referring MD/NP/PA: Dr Rhunette Croft  Patient coming from: home  Chief Complaint  Patient presents with   Shortness of Breath    CHF      HPI:    [place]  is a 89 y.o. female,  with history of HFpEF, sick sinus syndrome and complete heart block status post PPM, recurrent DVT on apixaban, persistent atrial fibrillation, hyperlipidemia, hypertension. -Patient presents to ED secondary to progressive dyspnea, worsening lower extremity edema, patient has been having worsening edema over the last few days, her dry weight is 149, she did weight 157 yesterday, her cardiologist instructed to increase her her Lasix dose which she has done yesterday, but without much improvement, giving worsening dyspnea, fatigue, she came to ED for further evaluation, denies any chest pain, fever or chills -In ED her workup significant for evaded BNP at 829, creatinine at 1.09, sodium low at 132, creatinine at baseline of 1.09, she has baseline anemia with hemoglobin of 7.5, [facility] hospitalist consulted to admit  Review of systems:     A full 10 point Review of Systems was done, except as stated above, all other Review of Systems were negative.   With Past History of the following :    Past Medical History:  Diagnosis Date   Arthritis    Atrial flutter (HCC)    s/p RFA 2003   Breast cancer (HCC)    1980's - lumpectomy only   Colon polyps    Diverticulosis    DVT, lower extremity (HCC) 1998 and 1999   Bilateral   Essential hypertension    Gallstones    GERD (gastroesophageal reflux disease)    Glaucoma    IBS (irritable bowel syndrome)    Mixed hyperlipidemia    Osteoporosis     Persistent atrial fibrillation (HCC)    Pneumonia 2011   Sick sinus syndrome (HCC)    PPM (SJM)   Urinary tract infection    Varicose veins       Past Surgical History:  Procedure Laterality Date   BIOPSY  07/04/2021   Procedure: BIOPSY;  Surgeon: Tressia Danas, MD;  Location: [facility] ENDOSCOPY;  Service: Gastroenterology;;   BREAST LUMPECTOMY  [phone]   Right breast    CARDIAC CATHETERIZATION  1999   Normal coronaries   Cataracts Bilateral    CHOLECYSTECTOMY     COLONOSCOPY W/ BIOPSIES AND POLYPECTOMY  03/2003, 05/2008, 02/20/2011   severe diverticulosis, tubulovillous adenoma polyp, internal and external hemorrhoids 2012: severe diverticulosis, 4 mm polyp, hemorrhoids   CYST REMOVAL TRUNK     under breast = on belly    DILATION AND CURETTAGE OF UTERUS     ESOPHAGOGASTRODUODENOSCOPY (  EGD) WITH PROPOFOL N/A 07/04/2021   Procedure: ESOPHAGOGASTRODUODENOSCOPY (EGD) WITH PROPOFOL;  Surgeon: Tressia Danas, MD;  Location: [facility] ENDOSCOPY;  Service: Gastroenterology;  Laterality: N/A;   EYE SURGERY     cataracts - bilateral    Fractured wrist Left 2013   repair   INSERT / REPLACE / REMOVE PACEMAKER     KNEE ARTHROSCOPY WITH MEDIAL MENISECTOMY Right 10/21/2014   Procedure: RIGHT KNEE ARTHROSCOPY WITH MEDIAL MENISECTOM,lateral menisectomy,synovectomy suprapatellar pouch;  Surgeon: Ranee Gosselin, MD;  Location: [facility] ORS;  Service: Orthopedics;  Laterality: Right;   PACEMAKER GENERATOR CHANGE  06/18/12   SJM [facility] DR RF, Dr Johney Frame   PACEMAKER INSERTION  2003   PERMANENT PACEMAKER GENERATOR CHANGE N/A 06/18/2012   Procedure: PERMANENT PACEMAKER GENERATOR CHANGE;  Surgeon: Hillis Range, MD;  Location: [facility] CATH LAB;  Service: Cardiovascular;  Laterality: N/A;   PPM GENERATOR CHANGEOUT N/A 09/30/2021   Procedure: PPM GENERATOR CHANGEOUT;  Surgeon: Hillis Range, MD;  Location: [facility] INVASIVE CV LAB;  Service: Cardiovascular;  Laterality: N/A;   TOTAL KNEE ARTHROPLASTY  12/20/2011   Procedure: TOTAL KNEE  ARTHROPLASTY;  Surgeon: Jacki Cones, MD;  Location: [facility] ORS;  Service: Orthopedics;  Laterality: Left;   TOTAL KNEE ARTHROPLASTY Right 02/10/2015   Procedure: TOTAL RIGHT KNEE ARTHROPLASTY;  Surgeon: Ranee Gosselin, MD;  Location: [facility] ORS;  Service: Orthopedics;  Laterality: Right;      Social History:     Social History   Tobacco Use   Smoking status: Never   Smokeless tobacco: Never  Substance Use Topics   Alcohol use: No    Alcohol/week: 0.0 standard drinks of alcohol        Family History :     Family History  Problem Relation Age of Onset   Throat cancer Father    Stroke Mother    Alzheimer's disease Mother    Alzheimer's disease Brother    Alzheimer's disease Sister    Colon cancer Maternal Grandfather    Stomach cancer Maternal Grandmother    Heart disease Paternal Grandmother    Heart attack Paternal Grandmother    Prostate cancer Son    Colon cancer Sister    Colon cancer Brother    Heart disease Brother    Atrial fibrillation Brother    Diabetes Brother    Dementia Brother    Parkinson's disease Brother    Prostate cancer Son       Home Medications:   Prior to Admission medications   Medication Sig Start Date End Date Taking? Authorizing Provider  acetaminophen (TYLENOL) 500 MG tablet Take 1,000 mg by mouth every 6 (six) hours as needed for mild pain.   Yes [provider]  amiodarone (PACERONE) 200 MG tablet TAKE ONE (1) TABLET EACH DAY Patient taking differently: Take 200 mg by mouth daily. 07/31/22  Yes Dettinger, Elige Radon, MD  apixaban (ELIQUIS) 5 MG TABS tablet Take 1 tablet (5 mg total) by mouth 2 (two) times daily. 10/04/22  Yes Dettinger, Elige Radon, MD  calcium-vitamin D (OSCAL WITH D) 500-200 MG-UNIT tablet Take 1 tablet by mouth daily with breakfast.    Yes [provider]  ezetimibe (ZETIA) 10 MG tablet Take 1 tablet (10 mg total) by mouth daily. 08/08/22  Yes Jonelle Sidle, MD  furosemide (LASIX) 40 MG tablet Take 1  tablet (40 mg total) by mouth daily. May take 2 tablets (80mg ) daily as needed 11/01/22  Yes Branch, Dorothe Pea, MD  HYDROcodone-acetaminophen (NORCO/VICODIN) 5-325 MG tablet Take 1  tablet by mouth every 6 (six) hours as needed for moderate pain. 10/18/22  Yes [provider]  latanoprost (XALATAN) 0.005 % ophthalmic solution Place 1 drop into both eyes at bedtime.   Yes [provider]  megestrol (MEGACE) 20 MG tablet Take 1 tablet (20 mg total) by mouth 2 (two) times daily. With breakfast and lunch 10/26/22  Yes Daphine Deutscher, Mary-Margaret, FNP  Multiple Vitamin (MULTIVITAMIN) tablet Take 1 tablet by mouth every morning.   Yes [provider]  nadolol (CORGARD) 40 MG tablet TAKE 1 AND 1/2 TABLETS DAILY 09/26/22  Yes Dettinger, Elige Radon, MD  nitroGLYCERIN (NITROSTAT) 0.4 MG SL tablet DISSOLVE 1 TAB UNDER TOUNGE FOR CHEST PAIN. MAY REPEAT EVERY 5 MINUTES FOR 3 DOSES. IF NO RELIEF CALL 911 OR GO TO ER Patient taking differently: Place 0.4 mg under the tongue every 5 (five) minutes as needed for chest pain. 09/10/20  Yes Jonelle Sidle, MD  pantoprazole (PROTONIX) 40 MG tablet TAKE ONE (1) TABLET BY MOUTH EVERY DAY 07/31/22  Yes Dettinger, Elige Radon, MD  Wheat Dextrin (BENEFIBER) POWD Take 1 Dose by mouth daily as needed (constipation).   Yes [provider]  baclofen (LIORESAL) 10 MG tablet Take 0.5 tablets (5 mg total) by mouth 3 (three) times daily. Patient not taking: Reported on 11/02/2022 08/24/22   Jannifer Rodney A, FNP  cyanocobalamin (VITAMIN B12) 1000 MCG/ML injection Inject 1 mL (1,000 mcg total) into the muscle every 30 (thirty) days. 04/10/22   Dettinger, Elige Radon, MD  lidocaine (XYLOCAINE) 5 % ointment Apply 1 Application topically as needed. Patient not taking: Reported on 11/02/2022 09/21/22   Dettinger, Elige Radon, MD  nystatin cream (MYCOSTATIN) Apply 1 Application topically 2 (two) times daily. Patient not taking: Reported on 11/02/2022 07/26/22   Gabriel Earing,  FNP  OVER THE COUNTER MEDICATION Take 1 tablet by mouth daily. calcium Patient not taking: Reported on 11/02/2022    [provider]     Allergies:     Allergies  Allergen Reactions   Naproxen Hives, Itching, Rash and Other (See Comments)   Iodinated Contrast Media Hives, Itching, Rash and Other (See Comments)    03/13/14: symptoms began within two hours of intrathecal injection (myelogram 03/11/14); on shoulders radiating down to stomach.  Relief with Benadryl.  Told patient she would need to pre-med with Benadryl in the future before receiving this contrast.  Donell Sievert, RN     Physical Exam:   Vitals  Blood pressure 125/64, pulse 62, temperature 98.2 F (36.8 C), resp. rate 18, height 5\' 3"  (1.6 m), weight 69.9 kg, SpO2 93 %.   1. General elderly female, laying in bed, no apparent distress, deconditioned, pale  2. Normal affect and insight, Not Suicidal or Homicidal, Awake Alert, Oriented X 3.  3. No F.N deficits, ALL C.Nerves Intact, Strength 5/5 all 4 extremities, Sensation intact all 4 extremities, Plantars down going.  4. Ears and Eyes appear Normal, Conjunctivae clear, PERRLA. Moist Oral Mucosa.  5. Supple Neck, + JVD, No cervical lymphadenopathy appriciated, No Carotid Bruits.  6. Symmetrical Chest wall movement, decreased air entry at the bases with crackles  7. RRR, No Gallops, Rubs or Murmurs, No Parasternal Heave.  + 2 edema  8. Positive Bowel Sounds, Abdomen Soft, No tenderness, No organomegaly appriciated,No rebound -guarding or rigidity.  9.  No Cyanosis, Normal Skin Turgor, No Skin Rash or Bruise.  10. Good muscle tone,  joints appear normal , no effusions, Normal ROM.  Data Review:    CBC Recent Labs  Lab 11/02/22 1628  WBC 4.9  HGB 7.5*  HCT 25.0*  PLT 141*  MCV 84.5  MCH 25.3*  MCHC 30.0  RDW 17.5*  LYMPHSABS 1.1  MONOABS 0.9  EOSABS 0.2  BASOSABS 0.0    ------------------------------------------------------------------------------------------------------------------  Chemistries  Recent Labs  Lab 11/02/22 1628  NA 132*  K 3.7  CL 94*  CO2 28  GLUCOSE 110*  BUN 28*  CREATININE 1.09*  CALCIUM 8.8*   ------------------------------------------------------------------------------------------------------------------ estimated creatinine clearance is 31.5 mL/min (A) (by C-G formula based on SCr of 1.09 mg/dL (H)). ------------------------------------------------------------------------------------------------------------------ No results for input(s): "TSH", "T4TOTAL", "T3FREE", "THYROIDAB" in the last 72 hours.  Invalid input(s): "FREET3"  Coagulation profile No results for input(s): "INR", "PROTIME" in the last 168 hours. ------------------------------------------------------------------------------------------------------------------- No results for input(s): "DDIMER" in the last 72 hours. -------------------------------------------------------------------------------------------------------------------  Cardiac Enzymes No results for input(s): "CKMB", "TROPONINI", "MYOGLOBIN" in the last 168 hours.  Invalid input(s): "CK" ------------------------------------------------------------------------------------------------------------------    Component Value Date/Time   BNP 829.0 (H) 11/02/2022 1628     ---------------------------------------------------------------------------------------------------------------  Urinalysis    Component Value Date/Time   COLORURINE COLORLESS (A) 11/24/2021 0101   APPEARANCEUR Clear 08/24/2022 1201   LABSPEC 1.004 (L) 11/24/2021 0101   PHURINE 8.0 11/24/2021 0101   GLUCOSEU Negative 08/24/2022 1201   HGBUR NEGATIVE 11/24/2021 0101   BILIRUBINUR Negative 08/24/2022 1201   KETONESUR NEGATIVE 11/24/2021 0101   PROTEINUR Negative 08/24/2022 1201   PROTEINUR NEGATIVE 11/24/2021 0101    UROBILINOGEN 0.2 02/03/2015 1518   NITRITE Negative 08/24/2022 1201   NITRITE NEGATIVE 11/24/2021 0101   LEUKOCYTESUR Negative 08/24/2022 1201   LEUKOCYTESUR NEGATIVE 11/24/2021 0101    ----------------------------------------------------------------------------------------------------------------   Imaging Results:    DG Chest Port 1 View  Result Date: 11/02/2022 CLINICAL DATA:  Shortness of breath. EXAM: PORTABLE CHEST 1 VIEW COMPARISON:  Oct 21, 2022 FINDINGS: A multi lead AICD is noted with stable lead positioning. The cardiac silhouette is mildly enlarged and unchanged in size. Mild, diffuse, chronic appearing increased interstitial lung markings are noted. A trace amount of residual atelectasis is seen within the left lung base. There is no evidence of acute infiltrate, pleural effusion or pneumothorax. Multilevel degenerative changes seen throughout the thoracic spine. IMPRESSION: Chronic appearing increased interstitial lung markings with a trace amount of residual left basilar atelectasis. Electronically Signed   By: Aram Candela M.D.   On: 11/02/2022 17:57   EKG:  Vent. rate 60 BPM PR interval 368 ms QRS duration 195 ms QT/QTcB 528/528 ms P-R-T axes 0 -70 112 Atrial-ventricular dual-paced rhythm No further analysis attempted due to paced rhythm No acute changes   Assessment & Plan:    Active Problems:   Pulmonary HTN /echo 11/18/2020   Essential hypertension, benign   Sick sinus syndrome (HCC)   Chronic anticoagulation   B12 deficiency   History of DVT (deep vein thrombosis)   Acute on chronic diastolic CHF (congestive heart failure) (HCC)   Acute on chronic diastolic CHF -Patient presents with evidence of volume overload, vascular congestion on chest x-ray, elevated BNP, worsening lower extremity edema, and increased her weight from 1 49-1 57. -Continue with IV Lasix 40 mg IV twice daily -The echo in June 2023 significant for EF 60 to 65%, no regional wall motion  abnormalities -Manage under CHF pathway, continue with daily weight, strict ins and out, and daily labs  B12 deficiency Continue with supplements   Persistent atrial fibrillation (HCC) -She is rate controlled, continue  with amiodarone and apixaban  Sick sinus syndrome (HCC) Status post permanent pacemaker, she had recent generation change in March 2023,    Essential hypertension, benign  Currently her blood pressure is soft, continue with nadolol to start from tomorrow, if remains soft may need to hold   Anemia of chronic kidney disease -Hemoglobin of 7.5, given her significant symptoms with deconditioning, weakness, will transfuse 1 unit PRBC especially she is on full anticoagulation  CKd stage IIIb-monitor closely as on IV diuresis  Hyponatremia -due to volume overload, should improve with diuresis     DVT Prophylaxis Eliquis  AM Labs Ordered, also please review Full Orders  Family Communication: Admission, patients condition and plan of care including tests being ordered have been discussed with the patient and  son at bedside who indicate understanding and agree with the plan and Code Status.  Code Status full  Likely DC to  home  Condition GUARDED    Consults called: none    Admission status: observation    Time spent in minutes : 70 minutes   Huey Bienenstock M.D on 11/02/2022 at 8:22 PM   [facility] - Office  [phone]

## 2022-11-02 NOTE — ED Triage Notes (Signed)
Pt BIB [facility] for SOB and LE edema, hx CHF, pt on room air with O2 sats 97% upon EMS arrival, pt has had intermittent cough, pt was instructed by cardiologist, Dr Wyline Mood to "increase Lasix 80 mg as needed" daily starting yesterday -pt took 80 mg yesterday and today- pt denies any new pain at this time- pt in no respiratory distress, reports SOB with exertion and activity.

## 2022-11-02 NOTE — Telephone Encounter (Signed)
Son states they will be calling emergency services to take his mother to ED at [facility] and wants a call back regarding instructions given.

## 2022-11-03 DIAGNOSIS — E871 Hypo-osmolality and hyponatremia: Secondary | ICD-10-CM | POA: Diagnosis present

## 2022-11-03 DIAGNOSIS — Z91041 Radiographic dye allergy status: Secondary | ICD-10-CM | POA: Diagnosis not present

## 2022-11-03 DIAGNOSIS — Z96651 Presence of right artificial knee joint: Secondary | ICD-10-CM | POA: Diagnosis present

## 2022-11-03 DIAGNOSIS — I5043 Acute on chronic combined systolic (congestive) and diastolic (congestive) heart failure: Secondary | ICD-10-CM | POA: Diagnosis present

## 2022-11-03 DIAGNOSIS — I495 Sick sinus syndrome: Secondary | ICD-10-CM | POA: Diagnosis present

## 2022-11-03 DIAGNOSIS — Z886 Allergy status to analgesic agent status: Secondary | ICD-10-CM | POA: Diagnosis not present

## 2022-11-03 DIAGNOSIS — K219 Gastro-esophageal reflux disease without esophagitis: Secondary | ICD-10-CM | POA: Diagnosis present

## 2022-11-03 DIAGNOSIS — Z8249 Family history of ischemic heart disease and other diseases of the circulatory system: Secondary | ICD-10-CM | POA: Diagnosis not present

## 2022-11-03 DIAGNOSIS — E538 Deficiency of other specified B group vitamins: Secondary | ICD-10-CM | POA: Diagnosis present

## 2022-11-03 DIAGNOSIS — N1832 Chronic kidney disease, stage 3b: Secondary | ICD-10-CM | POA: Diagnosis present

## 2022-11-03 DIAGNOSIS — M810 Age-related osteoporosis without current pathological fracture: Secondary | ICD-10-CM | POA: Diagnosis present

## 2022-11-03 DIAGNOSIS — I4819 Other persistent atrial fibrillation: Secondary | ICD-10-CM | POA: Diagnosis present

## 2022-11-03 DIAGNOSIS — Z79899 Other long term (current) drug therapy: Secondary | ICD-10-CM | POA: Diagnosis not present

## 2022-11-03 DIAGNOSIS — Z7901 Long term (current) use of anticoagulants: Secondary | ICD-10-CM | POA: Diagnosis not present

## 2022-11-03 DIAGNOSIS — Z853 Personal history of malignant neoplasm of breast: Secondary | ICD-10-CM | POA: Diagnosis not present

## 2022-11-03 DIAGNOSIS — H409 Unspecified glaucoma: Secondary | ICD-10-CM | POA: Diagnosis present

## 2022-11-03 DIAGNOSIS — Z86718 Personal history of other venous thrombosis and embolism: Secondary | ICD-10-CM | POA: Diagnosis not present

## 2022-11-03 DIAGNOSIS — I272 Pulmonary hypertension, unspecified: Secondary | ICD-10-CM | POA: Diagnosis present

## 2022-11-03 DIAGNOSIS — E876 Hypokalemia: Secondary | ICD-10-CM | POA: Diagnosis present

## 2022-11-03 DIAGNOSIS — E782 Mixed hyperlipidemia: Secondary | ICD-10-CM | POA: Diagnosis present

## 2022-11-03 DIAGNOSIS — I130 Hypertensive heart and chronic kidney disease with heart failure and stage 1 through stage 4 chronic kidney disease, or unspecified chronic kidney disease: Secondary | ICD-10-CM | POA: Diagnosis present

## 2022-11-03 DIAGNOSIS — D631 Anemia in chronic kidney disease: Secondary | ICD-10-CM | POA: Diagnosis present

## 2022-11-03 DIAGNOSIS — I5033 Acute on chronic diastolic (congestive) heart failure: Secondary | ICD-10-CM | POA: Diagnosis present

## 2022-11-03 DIAGNOSIS — Z800 Family history of malignant neoplasm of digestive organs: Secondary | ICD-10-CM | POA: Diagnosis not present

## 2022-11-03 DIAGNOSIS — Z950 Presence of cardiac pacemaker: Secondary | ICD-10-CM | POA: Diagnosis not present

## 2022-11-03 LAB — BASIC METABOLIC PANEL
Anion gap: 8 (ref 5–15)
BUN: 22 mg/dL (ref 8–23)
CO2: 27 mmol/L (ref 22–32)
Calcium: 8.7 mg/dL — ABNORMAL LOW (ref 8.9–10.3)
Chloride: 99 mmol/L (ref 98–111)
Creatinine, Ser: 0.97 mg/dL (ref 0.44–1.00)
GFR, Estimated: 55 mL/min — ABNORMAL LOW (ref >60)
Glucose, Bld: 90 mg/dL (ref 70–99)
Potassium: 3.2 mmol/L — ABNORMAL LOW (ref 3.5–5.1)
Sodium: 134 mmol/L — ABNORMAL LOW (ref 135–145)

## 2022-11-03 LAB — CBC
HCT: 27.6 % — ABNORMAL LOW (ref 36.0–46.0)
Hemoglobin: 8.3 g/dL — ABNORMAL LOW (ref 12.0–15.0)
MCH: 25.5 pg — ABNORMAL LOW (ref 26.0–34.0)
MCHC: 30.1 g/dL (ref 30.0–36.0)
MCV: 84.7 fL (ref 80.0–100.0)
Platelets: 129 10*3/uL — ABNORMAL LOW (ref 150–400)
RBC: 3.26 MIL/uL — ABNORMAL LOW (ref 3.87–5.11)
RDW: 16.9 % — ABNORMAL HIGH (ref 11.5–15.5)
WBC: 4.9 10*3/uL (ref 4.0–10.5)
nRBC: 0 % (ref 0.0–0.2)

## 2022-11-03 LAB — BPAM RBC
Blood Product Expiration Date: 202406072359
ISSUE DATE / TIME: 202405232307
Unit Type and Rh: 6200

## 2022-11-03 LAB — TYPE AND SCREEN
ABO/RH(D): A POS
Antibody Screen: NEGATIVE
Unit division: 0

## 2022-11-03 NOTE — Plan of Care (Signed)
  Problem: Acute Rehab PT Goals(only PT should resolve) Goal: Pt Will Go Supine/Side To Sit Outcome: Progressing Flowsheets (Taken 11/03/2022 1223) Pt will go Supine/Side to Sit:  with modified independence  with supervision Goal: Patient Will Transfer Sit To/From Stand Outcome: Progressing Flowsheets (Taken 11/03/2022 1223) Patient will transfer sit to/from stand:  with modified independence  with supervision Goal: Pt Will Transfer Bed To Chair/Chair To Bed Outcome: Progressing Flowsheets (Taken 11/03/2022 1223) Pt will Transfer Bed to Chair/Chair to Bed:  with modified independence  with supervision Goal: Pt Will Ambulate Outcome: Progressing Flowsheets (Taken 11/03/2022 1223) Pt will Ambulate:  75 feet  with supervision  with rolling walker   12:24 PM, 11/03/22 Ocie Bob, MPT Physical Therapist with [facility] [phone] office [phone] mobile phone

## 2022-11-03 NOTE — Evaluation (Signed)
Occupational Therapy Evaluation Patient Details Name: [name] MRN: [id number] DOB: [birth year] Today's Date: 11/03/2022   History of Present Illness [name]  is a 89 y.o. female,  with history of HFpEF, sick sinus syndrome and complete heart block status post PPM, recurrent DVT on apixaban, persistent atrial fibrillation, hyperlipidemia, hypertension.  -Patient presents to ED secondary to progressive dyspnea, worsening lower extremity edema, patient has been having worsening edema over the last few days, her dry weight is 149, she did weight 157 yesterday, her cardiologist instructed to increase her her Lasix dose which she has done yesterday, but without much improvement, giving worsening dyspnea, fatigue, she came to ED for further evaluation, denies any chest pain, fever or chills (per MD)   Clinical Impression   Pt agreeable to OT and PT co-evaluation. Pt  reports increase in difficulty for ADL's over the past 2 weeks. Today pt presents with general weakness but with ability to transfer and complete lower body dressing without much physical assist. Pt required min G to min A for transfers and set up to supervision for seated ADL's. Pt is mildly unsteady in standing and weak in B UE, but pt reports UE weakness at baseline. Pt will benefit from continued OT in the hospital and recommended venue below to increase strength, balance, and endurance for safe ADL's.        Recommendations for follow up therapy are one component of a multi-disciplinary discharge planning process, led by the attending physician.  Recommendations may be updated based on patient status, additional functional criteria and insurance authorization.   Assistance Recommended at Discharge Intermittent Supervision/Assistance  Patient can return home with the following A little help with walking and/or transfers;A little help with bathing/dressing/bathroom;Assistance with cooking/housework;Assist for  transportation;Help with stairs or ramp for entrance    Functional Status Assessment  Patient has had a recent decline in their functional status and demonstrates the ability to make significant improvements in function in a reasonable and predictable amount of time.  Equipment Recommendations  None recommended by OT    Recommendations for Other Services       Precautions / Restrictions Precautions Precautions: Fall Restrictions Weight Bearing Restrictions: No      Mobility Bed Mobility Overal bed mobility: Needs Assistance Bed Mobility: Supine to Sit     Supine to sit: Supervision     General bed mobility comments: Mild labored movement.    Transfers Overall transfer level: Needs assistance Equipment used: Rolling walker (2 wheels) Transfers: Sit to/from Stand, Bed to chair/wheelchair/BSC Sit to Stand: Min guard, Min assist     Step pivot transfers: Min guard     General transfer comment: Min A to boost from EOB initially but improved to min G from chair. Labored movement with RW.      Balance Overall balance assessment: Needs assistance Sitting-balance support: No upper extremity supported, Feet supported Sitting balance-Leahy Scale: Fair Sitting balance - Comments: fair to good seated at EOB   Standing balance support: During functional activity, Reliant on assistive device for balance, Bilateral upper extremity supported Standing balance-Leahy Scale: Fair Standing balance comment: with RW                           ADL either performed or assessed with clinical judgement   ADL Overall ADL's : Needs assistance/impaired     Grooming: Set up;Sitting   Upper Body Bathing: Set up;Sitting   Lower Body Bathing: Min guard;Set up;Sitting/lateral leans  Upper Body Dressing : Set up;Sitting   Lower Body Dressing: Set up;Min guard;Sitting/lateral leans   Toilet Transfer: Min guard;Minimal assistance;Ambulation;Rolling walker (2  wheels);Stand-pivot Statistician Details (indicate cue type and reason): Simulated via EOB to [facility] and ambulation in the room/hall. Toileting- Clothing Manipulation and Hygiene: Set up;Sitting/lateral lean       Functional mobility during ADLs: Min guard;Rolling walker (2 wheels) General ADL Comments: Pt ambulated over 60 feet in the hall with RW.     Vision Baseline Vision/History: 1 Wears glasses Ability to See in Adequate Light: 1 Impaired Patient Visual Report: No change from baseline Vision Assessment?: No apparent visual deficits                Pertinent Vitals/Pain Pain Assessment Pain Assessment: No/denies pain     Hand Dominance Left   Extremity/Trunk Assessment Upper Extremity Assessment Upper Extremity Assessment: Generalized weakness   Lower Extremity Assessment Lower Extremity Assessment: Defer to PT evaluation   Cervical / Trunk Assessment Cervical / Trunk Assessment: Kyphotic   Communication Communication Communication: No difficulties   Cognition Arousal/Alertness: Awake/alert Behavior During Therapy: WFL for tasks assessed/performed Overall Cognitive Status: Within Functional Limits for tasks assessed                                                        Home Living Family/patient expects to be discharged to:: Private residence Living Arrangements: Children Available Help at Discharge: Family;Available 24 hours/day Type of Home: House Home Access: Ramped entrance           Bathroom Shower/Tub: Walk-in shower   Bathroom Toilet: Handicapped height Bathroom Accessibility: Yes How Accessible: Accessible via walker Home Equipment: Rolling Walker (2 wheels);Rollator (4 wheels);BSC/3in1;Shower seat          Prior Functioning/Environment Prior Level of Function : Needs assist       Physical Assist : ADLs (physical)   ADLs (physical): IADLs Mobility Comments: Community ambulator with rollator. More difficulty  in the past 2 weeks. ADLs Comments: Independent ADL's prior to 2 weeks ago. Pt has a cleaning lady and does some IADL's.        OT Problem List: Decreased strength;Decreased activity tolerance;Impaired balance (sitting and/or standing)      OT Treatment/Interventions: Self-care/ADL training;Therapeutic exercise;Patient/family education;Balance training;Therapeutic activities    OT Goals(Current goals can be found in the care plan section) Acute Rehab OT Goals Patient Stated Goal: return home OT Goal Formulation: With patient Time For Goal Achievement: 11/17/22 Potential to Achieve Goals: Good  OT Frequency: Min 1X/week    Co-evaluation PT/OT/SLP Co-Evaluation/Treatment: Yes Reason for Co-Treatment: To address functional/ADL transfers   OT goals addressed during session: ADL's and self-care                       End of Session Equipment Utilized During Treatment: Rolling walker (2 wheels)  Activity Tolerance: Patient tolerated treatment well Patient left: in chair;with call bell/phone within reach  OT Visit Diagnosis: Unsteadiness on feet (R26.81);Other abnormalities of gait and mobility (R26.89);Muscle weakness (generalized) (M62.81)                Time: 0865-7846 OT Time Calculation (min): 15 min Charges:  OT General Charges $OT Visit: 1 Visit OT Evaluation $OT Eval Low Complexity: 1 Low  Taariq Leitz OT, MOT  Remi Deter  Amariah Kierstead 11/03/2022, 9:21 AM

## 2022-11-03 NOTE — Hospital Course (Addendum)
[name]  is a 89 y.o. female,  with history of HFpEF, sick sinus syndrome and complete heart block status post PPM, recurrent DVT on apixaban, persistent atrial fibrillation, hyperlipidemia, hypertension. -Patient presents to ED secondary to progressive dyspnea, worsening lower extremity edema, patient has been having worsening edema over the last few days, her dry weight is 149, she did weight 157 yesterday, her cardiologist instructed to increase her her Lasix dose which she has done yesterday, but without much improvement, giving worsening dyspnea, fatigue, she came to ED for further evaluation, denies any chest pain, fever or chills  ED:  workup significant for evaded BNP at 829, creatinine at 1.09, sodium low at 132, creatinine at baseline of 1.09, she has baseline anemia with hemoglobin of 7.5,  [facility] hospitalist consulted to admit.      Assessment & Plan:     Active Problems:   Pulmonary HTN /echo 11/18/2020   Essential hypertension, benign   Sick sinus syndrome (HCC)   Chronic anticoagulation   B12 deficiency   History of DVT (deep vein thrombosis)   Acute on chronic diastolic CHF (congestive heart failure) (HCC)     Acute on chronic diastolic CHF -Shortness of breath -POA: volume overload, vascular congestion on chest x-ray, elevated BNP, worsening lower extremity edema, and increased her weight from 1 49-1 57. -Continue with IV Lasix 40 mg IV twice daily  Intake/Output Summary (Last 24 hours) at 11/03/2022 1025 Last data filed at 11/03/2022 0900 Gross per 24 hour  Intake 1210 ml  Output 1700 ml  Net -490 ml   Filed Weights   11/02/22 1621 11/02/22 1900 11/03/22 0529  Weight: 69.4 kg 69.9 kg 67 kg      -The Echo in June 2023 significant for EF 60 to 65%, no regional wall motion abnormalities   B12 deficiency Continue with supplements   Persistent atrial fibrillation (HCC) -She is rate controlled, continue with amiodarone and Eliquis   Sick sinus syndrome  (HCC) Status post permanent pacemaker, she had recent generation change in March 2023,    Essential hypertension, benign  Currently her blood pressure is soft, continue with nadolol to start from tomorrow, if remains soft may need to hold   Anemia of chronic kidney disease -Hemoglobin of 7.5, given her significant symptoms with deconditioning, weakness,  -S/p Transfuse 1 unit PRBC 11/02/2022 - she is on full anticoagulation    Latest Ref Rng & Units 11/03/2022    4:31 AM 11/02/2022    4:28 PM 10/12/2022   10:55 AM  CBC  WBC 4.0 - 10.5 K/uL 4.9  4.9  5.3   Hemoglobin 12.0 - 15.0 g/dL 8.3  7.5  8.2   Hematocrit 36.0 - 46.0 % 27.6  25.0  25.8   Platelets 150 - 400 K/uL 129  141  187       CKd stage IIIb-monitor closely as on IV diuresis Lab Results  Component Value Date   CREATININE 0.97 11/03/2022   CREATININE 1.09 (H) 11/02/2022   CREATININE 1.22 (H) 10/12/2022      Hyponatremia -due to volume overload, should improve with diuresis   Debility/generalized weaknesses -This post PT OT evaluation, recommending home health

## 2022-11-03 NOTE — Evaluation (Signed)
Physical Therapy Evaluation Patient Details Name: [name] MRN: [id number] DOB: 1928-11-04 Today's Date: 11/03/2022  History of Present Illness  [place]  is a 89 y.o. female,  with history of HFpEF, sick sinus syndrome and complete heart block status post PPM, recurrent DVT on apixaban, persistent atrial fibrillation, hyperlipidemia, hypertension.  -Patient presents to ED secondary to progressive dyspnea, worsening lower extremity edema, patient has been having worsening edema over the last few days, her dry weight is 149, she did weight 157 yesterday, her cardiologist instructed to increase her her Lasix dose which she has done yesterday, but without much improvement, giving worsening dyspnea, fatigue, she came to ED for further evaluation, denies any chest pain, fever or chills   Clinical Impression  Patient demonstrates labored movement for sitting up at bedside requiring use bed rail to pull self to sitting, slightly labored cadence during ambulation using RW without loss of balance and limited for gait training due to c/o fatigue.  Patient tolerated sitting up in chair after therapy - nursing staff notified.  Patient will benefit from continued skilled physical therapy in hospital and recommended venue below to increase strength, balance, endurance for safe ADLs and gait.         Recommendations for follow up therapy are one component of a multi-disciplinary discharge planning process, led by the attending physician.  Recommendations may be updated based on patient status, additional functional criteria and insurance authorization.  Follow Up Recommendations       Assistance Recommended at Discharge Set up Supervision/Assistance  Patient can return home with the following  A little help with walking and/or transfers;A little help with bathing/dressing/bathroom;Help with stairs or ramp for entrance;Assistance with cooking/housework    Equipment Recommendations None  recommended by PT  Recommendations for Other Services       Functional Status Assessment Patient has had a recent decline in their functional status and demonstrates the ability to make significant improvements in function in a reasonable and predictable amount of time.     Precautions / Restrictions Precautions Precautions: Fall Restrictions Weight Bearing Restrictions: No      Mobility  Bed Mobility Overal bed mobility: Needs Assistance Bed Mobility: Supine to Sit     Supine to sit: Supervision     General bed mobility comments: Mild labored movement reuqiring use of bed rail to pull self to sitting    Transfers Overall transfer level: Needs assistance Equipment used: Rolling walker (2 wheels) Transfers: Sit to/from Stand, Bed to chair/wheelchair/BSC Sit to Stand: Min guard, Min assist   Step pivot transfers: Min guard       General transfer comment: increased time, labored movement    Ambulation/Gait Ambulation/Gait assistance: Min guard, Supervision Gait Distance (Feet): 55 Feet Assistive device: Rolling walker (2 wheels) Gait Pattern/deviations: Decreased step length - right, Decreased step length - left, Decreased stride length, Trunk flexed Gait velocity: decreased     General Gait Details: slightly labored cadence without loss of balance, limited mostly due to c/o fatigue  Stairs            Wheelchair Mobility    Modified Rankin (Stroke Patients Only)       Balance Overall balance assessment: Needs assistance Sitting-balance support: Feet supported, No upper extremity supported Sitting balance-Leahy Scale: Fair Sitting balance - Comments: fair/good seated at EOB   Standing balance support: During functional activity, Reliant on assistive device for balance, Bilateral upper extremity supported Standing balance-Leahy Scale: Fair Standing balance comment: fair/good using RW  Pertinent Vitals/Pain  Pain Assessment Pain Assessment: No/denies pain    Home Living Family/patient expects to be discharged to:: Private residence Living Arrangements: Children Available Help at Discharge: Family;Available 24 hours/day Type of Home: House Home Access: Ramped entrance       Home Layout: Laundry or work area in basement Home Equipment: Agricultural consultant (2 wheels);Rollator (4 wheels);BSC/3in1;Shower seat      Prior Function Prior Level of Function : Needs assist       Physical Assist : Mobility (physical) Mobility (physical): Bed mobility;Transfers;Gait;Stairs ADLs (physical): IADLs Mobility Comments: Community ambulator with rollator. More difficulty in the past 2 weeks. ADLs Comments: Independent ADL's prior to 2 weeks ago. Pt has a cleaning lady and does some IADL's.     Hand Dominance   Dominant Hand: Left    Extremity/Trunk Assessment   Upper Extremity Assessment Upper Extremity Assessment: Defer to OT evaluation    Lower Extremity Assessment Lower Extremity Assessment: Generalized weakness    Cervical / Trunk Assessment Cervical / Trunk Assessment: Kyphotic  Communication   Communication: No difficulties  Cognition Arousal/Alertness: Awake/alert Behavior During Therapy: WFL for tasks assessed/performed Overall Cognitive Status: Within Functional Limits for tasks assessed                                          General Comments      Exercises     Assessment/Plan    PT Assessment Patient needs continued PT services  PT Problem List Decreased strength;Decreased activity tolerance;Decreased balance;Decreased mobility       PT Treatment Interventions DME instruction;Gait training;Stair training;Functional mobility training;Therapeutic activities;Therapeutic exercise;Patient/family education;Balance training    PT Goals (Current goals can be found in the Care Plan section)  Acute Rehab PT Goals Patient Stated Goal: return home with family  to assist PT Goal Formulation: With patient Time For Goal Achievement: 11/06/22 Potential to Achieve Goals: Good    Frequency Min 3X/week     Co-evaluation PT/OT/SLP Co-Evaluation/Treatment: Yes Reason for Co-Treatment: To address functional/ADL transfers PT goals addressed during session: Mobility/safety with mobility;Balance;Proper use of DME OT goals addressed during session: ADL's and self-care       AM-PAC PT "6 Clicks" Mobility  Outcome Measure Help needed turning from your back to your side while in a flat bed without using bedrails?: None Help needed moving from lying on your back to sitting on the side of a flat bed without using bedrails?: A Little Help needed moving to and from a bed to a chair (including a wheelchair)?: A Little Help needed standing up from a chair using your arms (e.g., wheelchair or bedside chair)?: A Little Help needed to walk in hospital room?: A Little Help needed climbing 3-5 steps with a railing? : A Lot 6 Click Score: 18    End of Session   Activity Tolerance: Patient tolerated treatment well;Patient limited by fatigue Patient left: in chair;with call bell/phone within reach Nurse Communication: Mobility status PT Visit Diagnosis: Unsteadiness on feet (R26.81);Other abnormalities of gait and mobility (R26.89);Muscle weakness (generalized) (M62.81)    Time: 8119-1478 PT Time Calculation (min) (ACUTE ONLY): 26 min   Charges:   PT Evaluation $PT Eval Moderate Complexity: 1 Mod PT Treatments $Therapeutic Activity: 23-37 mins        12:22 PM, 11/03/22 Ocie Bob, MPT Physical Therapist with [facility] 336 [phone] office [phone] mobile phone

## 2022-11-03 NOTE — TOC Initial Note (Signed)
Transition of Care [facility]) - Initial/Assessment Note    Patient Details  Name: [name] MRN: [id number] Date of Birth: 08-05-1928  [facility] CM/SW Contact:    Villa Herb, LCSWA Phone Number: 11/03/2022, 2:46 PM  Clinical Narrative:                 [facility] consulted for CHF screen. CSW spoke with pts son who states that pt lives alone but he checks in daily. Pt is mostly independent in completing her ADLs. Pts son provides transportation when needed. Pt has HH through [place]. CSW reached out to [facility] rep Kandee Keen to see what orders would be needed. Pt has a walker to use when ambulating. Pt follows a heart healthy diet. Pt takes medications as prescribed. Pt weighs herself daily. TOC to follow.   Expected Discharge Plan: Home w Home Health Services Barriers to Discharge: Continued Medical Work up   Patient Goals and CMS Choice Patient states their goals for this hospitalization and ongoing recovery are:: return home CMS Medicare.gov Compare Post Acute Care list provided to:: Patient Represenative (must comment) Choice offered to / list presented to : Patient, Adult Children [place] ownership interest in [facility].provided to:: Adult Children    Expected Discharge Plan and Services In-house Referral: Clinical Social Work Discharge Planning Services: CM Consult Post Acute Care Choice: Home Health Living arrangements for the past 2 months: Single Family Home                             HH Agency: [facility] Date [facility] Agency Contacted: 11/03/22   Representative spoke with at [facility] Agency: Kandee Keen  Prior Living Arrangements/Services Living arrangements for the past 2 months: Single Family Home Lives with:: Self Patient language and need for interpreter reviewed:: Yes Do you feel safe going back to the place where you live?: Yes      Need for Family Participation in Patient Care: Yes (Comment) Care giver support system in place?:  Yes (comment) Current home services: DME Criminal Activity/Legal Involvement Pertinent to Current Situation/Hospitalization: No - Comment as needed  Activities of Daily Living Home Assistive Devices/Equipment: Walker (specify type) ADL Screening (condition at time of admission) Patient's cognitive ability adequate to safely complete daily activities?: Yes Is the patient deaf or have difficulty hearing?: No Does the patient have difficulty seeing, even when wearing glasses/contacts?: No Does the patient have difficulty concentrating, remembering, or making decisions?: No Patient able to express need for assistance with ADLs?: Yes Does the patient have difficulty dressing or bathing?: No Independently performs ADLs?: No Communication: Independent Dressing (OT): Needs assistance Is this a change from baseline?: Pre-admission baseline Grooming: Independent Feeding: Independent Bathing: Needs assistance Is this a change from baseline?: Pre-admission baseline Toileting: Independent In/Out Bed: Independent Walks in Home: Independent Does the patient have difficulty walking or climbing stairs?: Yes Weakness of Legs: Both Weakness of Arms/Hands: None  Permission Sought/Granted                  Emotional Assessment         Alcohol / Substance Use: Not Applicable Psych Involvement: No (comment)  Admission diagnosis:  Acute on chronic diastolic CHF (congestive heart failure) (HCC) [I50.33] Acute congestive heart failure, unspecified heart failure type (HCC) [I50.9] Acute on chronic combined systolic and diastolic CHF (congestive heart failure) (HCC) [I50.43] Patient Active Problem List   Diagnosis Date Noted   Acute  on chronic combined systolic and diastolic CHF (congestive heart failure) (HCC) 11/03/2022   Acute on chronic diastolic CHF (congestive heart failure) (HCC) 11/02/2022   Prolonged QT interval 11/24/2021   Gastric nodule    Chronic CHF (congestive heart failure)  (HCC) 07/02/2021   Iron deficiency anemia 07/02/2021   History of DVT (deep vein thrombosis) 07/02/2021   Pulmonary HTN /echo 11/18/2020 11/18/2020   Glaucoma    Osteoporosis 11/25/2018   B12 deficiency 11/25/2018   Coronary artery disease due to lipid rich plaque 01/27/2016   Persistent atrial fibrillation (HCC) 09/01/2015   Chronic anticoagulation 09/01/2015   History of total knee arthroplasty 02/10/2015   Sick sinus syndrome (HCC) 03/22/2011   Hyperlipidemia 02/02/2009   Essential hypertension, benign 02/02/2009   COLONIC POLYPS, ADENOMATOUS, HX OF 05/15/2008   PCP:  Dettinger, Elige Radon, MD Pharmacy:   [phone] - [place], [place] - [address] [name] [address] Phone: [phone] Fax: [phone]  [facility] [place], [place] - [address] [name] [address] Phone: [phone] Fax: [phone]     Social Determinants of Health (SDOH) Social History: SDOH Screenings   Food Insecurity: No Food Insecurity (11/02/2022)  Housing: Low Risk  (11/02/2022)  Transportation Needs: No Transportation Needs (11/02/2022)  Utilities: Not At Risk (11/02/2022)  Alcohol Screen: Low Risk  (01/26/2022)  Depression (PHQ2-9): Medium Risk (10/12/2022)  Financial Resource Strain: Low Risk  (01/26/2022)  Physical Activity: Insufficiently Active (01/26/2022)  Social Connections: Moderately Integrated (01/26/2022)  Stress: No Stress Concern Present (01/26/2022)  Tobacco Use: Low Risk  (11/02/2022)   SDOH Interventions:     Readmission Risk Interventions     No data to display

## 2022-11-03 NOTE — Progress Notes (Signed)
PROGRESS NOTE    Patient: [name]                            PCP: Dettinger, Elige Radon, MD                    DOB: 04-25-1929            DOA: 11/02/2022 [id number]             DOS: 11/03/2022, 10:25 AM   LOS: 0 days   Date of Service: The patient was seen and examined on 11/03/2022  Subjective:   The patient was seen and examined this morning. Hemodynamically stable. No issues overnight .  Brief Narrative:   [name]  is a 89 y.o. female,  with history of HFpEF, sick sinus syndrome and complete heart block status post PPM, recurrent DVT on apixaban, persistent atrial fibrillation, hyperlipidemia, hypertension. -Patient presents to ED secondary to progressive dyspnea, worsening lower extremity edema, patient has been having worsening edema over the last few days, her dry weight is 149, she did weight 157 yesterday, her cardiologist instructed to increase her her Lasix dose which she has done yesterday, but without much improvement, giving worsening dyspnea, fatigue, she came to ED for further evaluation, denies any chest pain, fever or chills  ED:  workup significant for evaded BNP at 829, creatinine at 1.09, sodium low at 132, creatinine at baseline of 1.09, she has baseline anemia with hemoglobin of 7.5,  [facility] hospitalist consulted to admit.      Assessment & Plan:     Active Problems:   Pulmonary HTN /echo 11/18/2020   Essential hypertension, benign   Sick sinus syndrome (HCC)   Chronic anticoagulation   B12 deficiency   History of DVT (deep vein thrombosis)   Acute on chronic diastolic CHF (congestive heart failure) (HCC)     Acute on chronic diastolic CHF -Shortness of breath -POA: volume overload, vascular congestion on chest x-ray, elevated BNP, worsening lower extremity edema, and increased her weight from 1 49-1 57. -Continue with IV Lasix 40 mg IV twice daily  Intake/Output Summary (Last 24 hours) at 11/03/2022 1025 Last data filed at  11/03/2022 0900 Gross per 24 hour  Intake 1210 ml  Output 1700 ml  Net -490 ml   Filed Weights   11/02/22 1621 11/02/22 1900 11/03/22 0529  Weight: 69.4 kg 69.9 kg 67 kg      -The Echo in June 2023 significant for EF 60 to 65%, no regional wall motion abnormalities   B12 deficiency Continue with supplements   Persistent atrial fibrillation (HCC) -She is rate controlled, continue with amiodarone and Eliquis   Sick sinus syndrome (HCC) Status post permanent pacemaker, she had recent generation change in March 2023,    Essential hypertension, benign  Currently her blood pressure is soft, continue with nadolol to start from tomorrow, if remains soft may need to hold   Anemia of chronic kidney disease -Hemoglobin of 7.5, given her significant symptoms with deconditioning, weakness,  -S/p Transfuse 1 unit PRBC 11/02/2022 - she is on full anticoagulation    Latest Ref Rng & Units 11/03/2022    4:31 AM 11/02/2022    4:28 PM 10/12/2022   10:55 AM  CBC  WBC 4.0 - 10.5 K/uL 4.9  4.9  5.3   Hemoglobin 12.0 - 15.0 g/dL 8.3  7.5  8.2   Hematocrit 36.0 - 46.0 % 27.6  25.0  25.8   Platelets 150 - 400 K/uL 129  141  187       CKd stage IIIb-monitor closely as on IV diuresis Lab Results  Component Value Date   CREATININE 0.97 11/03/2022   CREATININE 1.09 (H) 11/02/2022   CREATININE 1.22 (H) 10/12/2022      Hyponatremia -due to volume overload, should improve with diuresis   Debility/generalized weaknesses -This post PT OT evaluation, recommending home health  ---------------------------------------------------------------------------------------------------------------------------- Nutritional status:  The patient's BMI is: Body mass index is 26.17 kg/m. I agree with the assessment and plan as outlined below: Nutrition Status:          ------------------------------------------------------------------------------------------------------------------------  DVT  prophylaxis:   apixaban (ELIQUIS) tablet 5 mg   Code Status:   Code Status: Full Code  Family Communication: No family member present at bedside- attempt will be made to update daily The above findings and plan of care has been discussed with patient (and family)  in detail,  they expressed understanding and agreement of above. -Advance care planning has been discussed.   Admission status:   Status is: Inpatient Remains inpatient appropriate because: Needing further evaluation progressive physical decline, continue diuretics   Disposition: From  - home             Planning for discharge in 1-2 days: to home with home health  Procedures:   No admission procedures for hospital encounter.   Antimicrobials:  Anti-infectives (From admission, onward)    None        Medication:   amiodarone  200 mg Oral Daily   apixaban  5 mg Oral BID   ezetimibe  10 mg Oral Daily   latanoprost  1 drop Both Eyes QHS   nadolol  60 mg Oral Daily   pantoprazole  40 mg Oral Daily   sodium chloride flush  3 mL Intravenous Q12H    sodium chloride, acetaminophen, sodium chloride flush   Objective:   Vitals:   11/02/22 2245 11/02/22 2330 11/03/22 0144 11/03/22 0529  BP: (!) 133/59 120/61 (!) 104/55 (!) 112/50  Pulse: (!) 59 62 62 60  Resp: 18 18 17 18   Temp: 98.1 F (36.7 C) 98 F (36.7 C) 98 F (36.7 C) 98.3 F (36.8 C)  TempSrc:   Oral Oral  SpO2: 94% 94% 98% 96%  Weight:    67 kg  Height:        Intake/Output Summary (Last 24 hours) at 11/03/2022 1025 Last data filed at 11/03/2022 0900 Gross per 24 hour  Intake 1210 ml  Output 1700 ml  Net -490 ml   Filed Weights   11/02/22 1621 11/02/22 1900 11/03/22 0529  Weight: 69.4 kg 69.9 kg 67 kg     Physical examination:      General:  AAO x 3,  cooperative, no distress;   HEENT:  Normocephalic, PERRL, otherwise with in Normal limits   Neuro:  CNII-XII intact. , normal motor and sensation, reflexes intact   Lungs:   Clear  to auscultation BL, Respirations unlabored,  No wheezes / crackles  Cardio:    S1/S2, RRR, No murmure, No Rubs or Gallops   Abdomen:  Soft, non-tender, bowel sounds active all four quadrants, no guarding or peritoneal signs.  Muscular  skeletal:  Unsteady gait, ambulates with assist of walker at baseline  Limited exam -global generalized weaknesses - in bed, able to move all 4 extremities,   2+ pulses,  symmetric, No pitting edema  Skin:  Dry, warm  to touch, negative for any Rashes,  Wounds: Please see nursing documentation          ------------------------------------------------------------------------------------------------------------------------------------------    LABs:     Latest Ref Rng & Units 11/03/2022    4:31 AM 11/02/2022    4:28 PM 10/12/2022   10:55 AM  CBC  WBC 4.0 - 10.5 K/uL 4.9  4.9  5.3   Hemoglobin 12.0 - 15.0 g/dL 8.3  7.5  8.2   Hematocrit 36.0 - 46.0 % 27.6  25.0  25.8   Platelets 150 - 400 K/uL 129  141  187       Latest Ref Rng & Units 11/03/2022    4:31 AM 11/02/2022    4:28 PM 10/12/2022   10:55 AM  CMP  Glucose 70 - 99 mg/dL 90  161  096   BUN 8 - 23 mg/dL 22  28  35   Creatinine 0.44 - 1.00 mg/dL 0.45  4.09  8.11   Sodium 135 - 145 mmol/L 134  132  137   Potassium 3.5 - 5.1 mmol/L 3.2  3.7  4.6   Chloride 98 - 111 mmol/L 99  94  99   CO2 22 - 32 mmol/L 27  28  23    Calcium 8.9 - 10.3 mg/dL 8.7  8.8  9.3   Total Protein 6.0 - 8.5 g/dL   6.0   Total Bilirubin 0.0 - 1.2 mg/dL   0.4   Alkaline Phos 44 - 121 IU/L   62   AST 0 - 40 IU/L   20   ALT 0 - 32 IU/L   18        Micro Results No results found for this or any previous visit (from the past 240 hour(s)).  Radiology Reports DG Chest Port 1 View  Result Date: 11/02/2022 CLINICAL DATA:  Shortness of breath. EXAM: PORTABLE CHEST 1 VIEW COMPARISON:  Oct 21, 2022 FINDINGS: A multi lead AICD is noted with stable lead positioning. The cardiac silhouette is mildly enlarged and unchanged in  size. Mild, diffuse, chronic appearing increased interstitial lung markings are noted. A trace amount of residual atelectasis is seen within the left lung base. There is no evidence of acute infiltrate, pleural effusion or pneumothorax. Multilevel degenerative changes seen throughout the thoracic spine. IMPRESSION: Chronic appearing increased interstitial lung markings with a trace amount of residual left basilar atelectasis. Electronically Signed   By: Aram Candela M.D.   On: 11/02/2022 17:57    SIGNED: Kendell Bane, MD, FHM. FAAFP. [facility] - [facility] hospitalist Time spent - 35 min.  In seeing, evaluating and examining the patient. Reviewing medical records, labs, drawn plan of care. [facility],  Pager (please use [email or web address] to page/ text) Please use Epic Secure Chat for non-urgent communication (7AM-7PM)  If 7PM-7AM, please contact night-coverage [email or web address], 11/03/2022, 10:25 AM

## 2022-11-03 NOTE — Progress Notes (Signed)
Remote pacemaker transmission.   

## 2022-11-03 NOTE — Plan of Care (Signed)
  Problem: Acute Rehab OT Goals (only OT should resolve) Goal: Pt. Will Perform Grooming Flowsheets (Taken 11/03/2022 [phone]) Pt Will Perform Grooming:  with modified independence  standing Goal: Pt. Will Perform Lower Body Dressing Flowsheets (Taken 11/03/2022 0923) Pt Will Perform Lower Body Dressing:  with modified independence  sitting/lateral leans Goal: Pt. Will Transfer To Toilet Flowsheets (Taken 11/03/2022 [phone]) Pt Will Transfer to Toilet:  with modified independence  ambulating Goal: Pt/Caregiver Will Perform Home Exercise Program Flowsheets (Taken 11/03/2022 [phone]) Pt/caregiver will Perform Home Exercise Program:  Increased strength  Both right and left upper extremity  Independently  S[place] OT, MOT

## 2022-11-04 DIAGNOSIS — I5043 Acute on chronic combined systolic (congestive) and diastolic (congestive) heart failure: Secondary | ICD-10-CM | POA: Diagnosis not present

## 2022-11-04 LAB — CBC
HCT: 26.1 % — ABNORMAL LOW (ref 36.0–46.0)
Hemoglobin: 8 g/dL — ABNORMAL LOW (ref 12.0–15.0)
MCH: 25.6 pg — ABNORMAL LOW (ref 26.0–34.0)
MCHC: 30.7 g/dL (ref 30.0–36.0)
MCV: 83.7 fL (ref 80.0–100.0)
Platelets: 134 10*3/uL — ABNORMAL LOW (ref 150–400)
RBC: 3.12 MIL/uL — ABNORMAL LOW (ref 3.87–5.11)
RDW: 17.5 % — ABNORMAL HIGH (ref 11.5–15.5)
WBC: 4.8 10*3/uL (ref 4.0–10.5)
nRBC: 0 % (ref 0.0–0.2)

## 2022-11-04 LAB — HEMOGLOBIN AND HEMATOCRIT, BLOOD
HCT: 28.3 % — ABNORMAL LOW (ref 36.0–46.0)
Hemoglobin: 8.4 g/dL — ABNORMAL LOW (ref 12.0–15.0)

## 2022-11-04 LAB — BASIC METABOLIC PANEL
Anion gap: 9 (ref 5–15)
BUN: 17 mg/dL (ref 8–23)
CO2: 27 mmol/L (ref 22–32)
Calcium: 9.1 mg/dL (ref 8.9–10.3)
Chloride: 96 mmol/L — ABNORMAL LOW (ref 98–111)
Creatinine, Ser: 0.87 mg/dL (ref 0.44–1.00)
GFR, Estimated: >60 mL/min (ref >60)
Glucose, Bld: 100 mg/dL — ABNORMAL HIGH (ref 70–99)
Potassium: 3.3 mmol/L — ABNORMAL LOW (ref 3.5–5.1)
Sodium: 132 mmol/L — ABNORMAL LOW (ref 135–145)

## 2022-11-04 LAB — BRAIN NATRIURETIC PEPTIDE
B Natriuretic Peptide: 856 pg/mL — ABNORMAL HIGH (ref 0.0–100.0)
B Natriuretic Peptide: 704 pg/mL — ABNORMAL HIGH (ref 0.0–100.0)

## 2022-11-04 LAB — MAGNESIUM: Magnesium: 2.2 mg/dL (ref 1.7–2.4)

## 2022-11-04 MED ORDER — METOPROLOL TARTRATE 25 MG PO TABS
12.5000 mg | ORAL_TABLET | Freq: Two times a day (BID) | ORAL | Status: DC
  Administered 2022-11-05: 12.5 mg via ORAL
  Filled 2022-11-04: qty 1

## 2022-11-04 MED ORDER — FUROSEMIDE 10 MG/ML IJ SOLN
20.0000 mg | Freq: Two times a day (BID) | INTRAMUSCULAR | Status: DC
  Administered 2022-11-04 (×2): 20 mg via INTRAVENOUS
  Filled 2022-11-04 (×3): qty 2

## 2022-11-04 MED ORDER — NYSTATIN 100000 UNIT/GM EX POWD
Freq: Two times a day (BID) | CUTANEOUS | Status: DC
  Filled 2022-11-04: qty 15

## 2022-11-04 MED ORDER — POTASSIUM CHLORIDE CRYS ER 20 MEQ PO TBCR
40.0000 meq | EXTENDED_RELEASE_TABLET | Freq: Once | ORAL | Status: AC
  Administered 2022-11-04: 40 meq via ORAL
  Filled 2022-11-04: qty 2

## 2022-11-04 MED ORDER — DIPHENHYDRAMINE HCL 25 MG PO CAPS
25.0000 mg | ORAL_CAPSULE | Freq: Three times a day (TID) | ORAL | Status: DC | PRN
  Administered 2022-11-04 – 2022-11-05 (×2): 25 mg via ORAL
  Filled 2022-11-04 (×2): qty 1

## 2022-11-04 NOTE — TOC Progression Note (Signed)
Transition of Care [facility]) - Progression Note    Patient Details  Name: [name] MRN: [id number] Date of Birth: 1928/10/01  Transition of Care [facility]) CM/SW Contact  Catalina Gravel, LCSW Phone Number: 11/04/2022, 12:37 PM  Clinical Narrative:    Pt progressing, Lives alone, active with [facility], CSW will advise [facility] at D[facility], active with [facility] PT/OT and RN. TOC to follow.   Expected Discharge Plan: Home w Home Health Services Barriers to Discharge: Continued Medical Work up  Expected Discharge Plan and Services In-house Referral: Clinical Social Work Discharge Planning Services: CM Consult Post Acute Care Choice: Home Health Living arrangements for the past 2 months: Single Family Home                             HH Agency: [facility] Date [facility] Agency Contacted: 11/03/22   Representative spoke with at [facility] Agency: Kandee Keen   Social Determinants of Health (SDOH) Interventions SDOH Screenings   Food Insecurity: No Food Insecurity (11/02/2022)  Housing: Low Risk  (11/02/2022)  Transportation Needs: No Transportation Needs (11/02/2022)  Utilities: Not At Risk (11/02/2022)  Alcohol Screen: Low Risk  (01/26/2022)  Depression (PHQ2-9): Medium Risk (10/12/2022)  Financial Resource Strain: Low Risk  (01/26/2022)  Physical Activity: Insufficiently Active (01/26/2022)  Social Connections: Moderately Integrated (01/26/2022)  Stress: No Stress Concern Present (01/26/2022)  Tobacco Use: Low Risk  (11/02/2022)    Readmission Risk Interventions     No data to display

## 2022-11-04 NOTE — Progress Notes (Signed)
PROGRESS NOTE    Patient: [name]                            PCP: Dettinger, Elige Radon, MD                    DOB: Nov 22, 1928            DOA: 11/02/2022 HER:[id number]             DOS: 11/04/2022, 10:10 AM   LOS: 1 day   Date of Service: The patient was seen and examined on 11/04/2022  Subjective:   The patient was seen and examined this morning. Hemodynamically stable. Complaining of shortness of breath with exertion, generalized weakness ambulates with walker only, bilateral lower extremity edema No issues overnight .  Brief Narrative:   [name]  is a 89 y.o. female,  with history of HFpEF, sick sinus syndrome and complete heart block status post PPM, recurrent DVT on apixaban, persistent atrial fibrillation, hyperlipidemia, hypertension. -Patient presents to ED secondary to progressive dyspnea, worsening lower extremity edema, patient has been having worsening edema over the last few days, her dry weight is 149, she did weight 157 yesterday, her cardiologist instructed to increase her her Lasix dose which she has done yesterday, but without much improvement, giving worsening dyspnea, fatigue, she came to ED for further evaluation, denies any chest pain, fever or chills  ED:  workup significant for evaded BNP at 829, creatinine at 1.09, sodium low at 132, creatinine at baseline of 1.09, she has baseline anemia with hemoglobin of 7.5,  [facility] hospitalist consulted to admit.      Assessment & Plan:     Active Problems:   Pulmonary HTN /echo 11/18/2020   Essential hypertension, benign   Sick sinus syndrome (HCC)   Chronic anticoagulation   B12 deficiency   History of DVT (deep vein thrombosis)   Acute on chronic diastolic CHF (congestive heart failure) (HCC)     Acute on chronic diastolic CHF -Shortness of breath with exertion   -POA: volume overload, vascular congestion on chest x-ray, elevated BNP, worsening lower extremity edema, and increased her weight  from 1 49-1 57. -Continue with IV Lasix 40 mg IV twice daily>> reduced to 20 mg twice a day due to low blood pressure  Intake/Output Summary (Last 24 hours) at 11/04/2022 1009 Last data filed at 11/03/2022 1700 Gross per 24 hour  Intake 480 ml  Output 400 ml  Net 80 ml   Filed Weights   11/02/22 1900 11/03/22 0529 11/04/22 0500  Weight: 69.9 kg 67 kg 67.9 kg   -The Echo in June 2023 significant for EF 60 to 65%, no regional wall motion abnormalities   B12 deficiency Continue with supplements   Persistent atrial fibrillation (HCC) -She is rate controlled, continue with amiodarone and Eliquis   Sick sinus syndrome (HCC) Status post permanent pacemaker, she had recent generation change in March 2023,  Patient beta-blocker has been changed from normal to metoprolol due to bradycardia and low blood pressure   Essential hypertension, benign  Currently her blood pressure is soft,  -Home medication of normal switch to low-dose metoprolol 12.5 mg p.o. twice daily   Anemia of chronic kidney disease -Hemoglobin of 7.5, given her significant symptoms with deconditioning, weakness,  -S/p Transfuse 1 unit PRBC 11/02/2022 - she is on full anticoagulation    Latest Ref Rng & Units 11/04/2022    8:01  AM 11/04/2022    4:21 AM 11/03/2022    4:31 AM  CBC  WBC 4.0 - 10.5 K/uL  4.8  4.9   Hemoglobin 12.0 - 15.0 g/dL 8.4  8.0  8.3   Hematocrit 36.0 - 46.0 % 28.3  26.1  27.6   Platelets 150 - 400 K/uL  134  129    Will monitor closely and evaluated for another unit of PRBC transfusion before discharge   CKd stage IIIb-monitor closely as on IV diuresis Lab Results  Component Value Date   CREATININE 0.87 11/04/2022   CREATININE 0.97 11/03/2022   CREATININE 1.09 (H) 11/02/2022      Hyponatremia -due to volume overload, improved 134, 132   Hypokalemia  -Repleting PO   debility/generalized weaknesses -This post PT OT evaluation, recommending home  health  ---------------------------------------------------------------------------------------------------------------------------- Nutritional status:  The patient's BMI is: Body mass index is 26.52 kg/m. I agree with the assessment and plan as outlined below: Nutrition Status:       ------------------------------------------------------------------------------------------------------------------------  DVT prophylaxis:   apixaban (ELIQUIS) tablet 5 mg   Code Status:   Code Status: Full Code  Family Communication: Patient's son present at bedside, updated The above findings and plan of care has been discussed with patient (and family)  in detail,  they expressed understanding and agreement of above. -Advance care planning has been discussed.   Admission status:   Status is: Inpatient Remains inpatient appropriate because: Needing further evaluation progressive physical decline, continue diuretics   Disposition: From  - home             Planning for discharge in 1-2 days: to home with home health  Procedures:   No admission procedures for hospital encounter.   Antimicrobials:  Anti-infectives (From admission, onward)    None        Medication:   amiodarone  200 mg Oral Daily   apixaban  5 mg Oral BID   ezetimibe  10 mg Oral Daily   furosemide  20 mg Intravenous Q12H   latanoprost  1 drop Both Eyes QHS   metoprolol tartrate  12.5 mg Oral BID   pantoprazole  40 mg Oral Daily   potassium chloride  40 mEq Oral Once   sodium chloride flush  3 mL Intravenous Q12H    sodium chloride, acetaminophen, sodium chloride flush   Objective:   Vitals:   11/03/22 2032 11/04/22 0353 11/04/22 0500 11/04/22 0835  BP: (!) 120/59 99/60  (!) 99/53  Pulse: 67 66  63  Resp: 20     Temp: 98 F (36.7 C) 98.7 F (37.1 C)    TempSrc: Oral Oral    SpO2: 100% 94%  100%  Weight:   67.9 kg   Height:        Intake/Output Summary (Last 24 hours) at 11/04/2022 1010 Last data  filed at 11/03/2022 1700 Gross per 24 hour  Intake 480 ml  Output 400 ml  Net 80 ml   Filed Weights   11/02/22 1900 11/03/22 0529 11/04/22 0500  Weight: 69.9 kg 67 kg 67.9 kg     Physical examination:   General:  AAO x 3,  cooperative, no distress;   HEENT:  Normocephalic, PERRL, otherwise with in Normal limits   Neuro:  CNII-XII intact. , normal motor and sensation, reflexes intact   Lungs:   Clear to auscultation BL, Respirations unlabored,  No wheezes / crackles  Cardio:    S1/S2, RRR, No murmure, No Rubs or Gallops  Abdomen:  Soft, non-tender, bowel sounds active all four quadrants, no guarding or peritoneal signs.  Muscular  skeletal:  Limited exam -global generalized weaknesses - in bed, able to move all 4 extremities,   2+ pulses,  symmetric, +2 pitting edema  Skin:  Dry, warm to touch, negative for any Rashes,  Wounds: Please see nursing documentation           ------------------------------------------------------------------------------------------------------------------------------------------    LABs:     Latest Ref Rng & Units 11/04/2022    8:01 AM 11/04/2022    4:21 AM 11/03/2022    4:31 AM  CBC  WBC 4.0 - 10.5 K/uL  4.8  4.9   Hemoglobin 12.0 - 15.0 g/dL 8.4  8.0  8.3   Hematocrit 36.0 - 46.0 % 28.3  26.1  27.6   Platelets 150 - 400 K/uL  134  129       Latest Ref Rng & Units 11/04/2022    4:21 AM 11/03/2022    4:31 AM 11/02/2022    4:28 PM  CMP  Glucose 70 - 99 mg/dL 147  90  829   BUN 8 - 23 mg/dL 17  22  28    Creatinine 0.44 - 1.00 mg/dL 5.62  1.30  8.65   Sodium 135 - 145 mmol/L 132  134  132   Potassium 3.5 - 5.1 mmol/L 3.3  3.2  3.7   Chloride 98 - 111 mmol/L 96  99  94   CO2 22 - 32 mmol/L 27  27  28    Calcium 8.9 - 10.3 mg/dL 9.1  8.7  8.8        Micro Results No results found for this or any previous visit (from the past 240 hour(s)).  Radiology Reports No results found.  SIGNED: Kendell Bane, MD, FHM. FAAFP. [facility] - [facility] hospitalist Time spent - 35 min.  In seeing, evaluating and examining the patient. Reviewing medical records, labs, drawn plan of care. [facility],  Pager (please use [email or web address] to page/ text) Please use Epic Secure Chat for non-urgent communication (7AM-7PM)  If 7PM-7AM, please contact night-coverage [email or web address], 11/04/2022, 10:10 AM

## 2022-11-05 DIAGNOSIS — I5043 Acute on chronic combined systolic (congestive) and diastolic (congestive) heart failure: Secondary | ICD-10-CM | POA: Diagnosis not present

## 2022-11-05 LAB — CBC
HCT: 28.4 % — ABNORMAL LOW (ref 36.0–46.0)
Hemoglobin: 8.5 g/dL — ABNORMAL LOW (ref 12.0–15.0)
MCH: 25.6 pg — ABNORMAL LOW (ref 26.0–34.0)
MCHC: 29.9 g/dL — ABNORMAL LOW (ref 30.0–36.0)
MCV: 85.5 fL (ref 80.0–100.0)
Platelets: 147 10*3/uL — ABNORMAL LOW (ref 150–400)
RBC: 3.32 MIL/uL — ABNORMAL LOW (ref 3.87–5.11)
RDW: 17.7 % — ABNORMAL HIGH (ref 11.5–15.5)
WBC: 4.5 10*3/uL (ref 4.0–10.5)
nRBC: 0 % (ref 0.0–0.2)

## 2022-11-05 LAB — BASIC METABOLIC PANEL
Anion gap: 12 (ref 5–15)
BUN: 16 mg/dL (ref 8–23)
CO2: 25 mmol/L (ref 22–32)
Calcium: 9 mg/dL (ref 8.9–10.3)
Chloride: 98 mmol/L (ref 98–111)
Creatinine, Ser: 0.92 mg/dL (ref 0.44–1.00)
GFR, Estimated: 59 mL/min — ABNORMAL LOW (ref >60)
Glucose, Bld: 103 mg/dL — ABNORMAL HIGH (ref 70–99)
Potassium: 3.6 mmol/L (ref 3.5–5.1)
Sodium: 135 mmol/L (ref 135–145)

## 2022-11-05 LAB — BRAIN NATRIURETIC PEPTIDE: B Natriuretic Peptide: 859 pg/mL — ABNORMAL HIGH (ref 0.0–100.0)

## 2022-11-05 MED ORDER — FUROSEMIDE 10 MG/ML IJ SOLN
40.0000 mg | Freq: Once | INTRAMUSCULAR | Status: AC
  Administered 2022-11-05: 40 mg via INTRAVENOUS
  Filled 2022-11-05: qty 4

## 2022-11-05 MED ORDER — FUROSEMIDE 10 MG/ML IJ SOLN: 40.0000 mg | Freq: Once | INTRAMUSCULAR | Status: DC

## 2022-11-05 MED ORDER — NADOLOL 40 MG PO TABS: 20.0000 mg | ORAL_TABLET | Freq: Two times a day (BID) | ORAL | 2 refills | Status: DC

## 2022-11-05 MED ORDER — POTASSIUM CHLORIDE CRYS ER 20 MEQ PO TBCR
20.0000 meq | EXTENDED_RELEASE_TABLET | Freq: Once | ORAL | Status: AC
  Administered 2022-11-05: 20 meq via ORAL
  Filled 2022-11-05: qty 1

## 2022-11-05 MED ORDER — ALBUMIN HUMAN 5 % IV SOLN
25.0000 g | Freq: Once | INTRAVENOUS | Status: AC
  Administered 2022-11-05: 25 g via INTRAVENOUS
  Filled 2022-11-05: qty 500

## 2022-11-05 MED ORDER — FUROSEMIDE 10 MG/ML IJ SOLN
40.0000 mg | Freq: Two times a day (BID) | INTRAMUSCULAR | Status: DC
  Administered 2022-11-05: 40 mg via INTRAVENOUS
  Filled 2022-11-05: qty 4

## 2022-11-05 NOTE — Discharge Summary (Signed)
Physician Discharge Summary   Patient: [name] MRN: [id number] DOB: 1928/07/20  Admit date:     11/02/2022  Discharge date: 11/05/22  Discharge Physician: Kendell Bane   PCP: Dettinger, Elige Radon, MD   Recommendations at discharge:  -Monitor daily weight, any increase in dry weight 3-5 pounds in 24 hours extra dose of Lasix to 40 mg may be taken, and notify your cardiologist -Monitor your blood pressure daily especially with Lasix..  If systolic blood pressures less than 100, hold Lasix and notify you cardiologist -Increase mobility and activity with walker, with fall precautions -CBC, BMP in 1 week, results to PCP  Discharge Diagnoses: Principal Problem:   Acute on chronic combined systolic and diastolic CHF (congestive heart failure) (HCC) Active Problems:   Pulmonary HTN /echo 11/18/2020   Essential hypertension, benign   Sick sinus syndrome (HCC)   Chronic anticoagulation   B12 deficiency   History of DVT (deep vein thrombosis)   Acute on chronic diastolic CHF (congestive heart failure) (HCC)  Resolved Problems:   * No resolved hospital problems. *  Hospital Course: [name]  is a 89 y.o. female,  with history of HFpEF, sick sinus syndrome and complete heart block status post PPM, recurrent DVT on apixaban, persistent atrial fibrillation, hyperlipidemia, hypertension. -Patient presents to ED secondary to progressive dyspnea, worsening lower extremity edema, patient has been having worsening edema over the last few days, her dry weight is 149, she did weight 157 yesterday, her cardiologist instructed to increase her her Lasix dose which she has done yesterday, but without much improvement, giving worsening dyspnea, fatigue, she came to ED for further evaluation, denies any chest pain, fever or chills  ED:  workup significant for evaded BNP at 829, creatinine at 1.09, sodium low at 132, creatinine at baseline of 1.09, she has baseline anemia with hemoglobin  of 7.5,  [facility] hospitalist consulted to admit.        Acute on chronic diastolic CHF -Shortness of breath with exertion   -POA: volume overload, vascular congestion on chest x-ray, elevated BNP, worsening lower extremity edema, and increased her weight from 1 49-1 57. -Continue with IV Lasix 40 mg IV twice daily>> reduced to 20 mg twice a day due to low blood pressure  Intake/Output Summary (Last 24 hours) at 11/04/2022 1009 Last data filed at 11/03/2022 1700 Gross per 24 hour  Intake 480 ml  Output 400 ml  Net 80 ml   Filed Weights   11/02/22 1900 11/03/22 0529 11/04/22 0500  Weight: 69.9 kg 67 kg 67.9 kg   -The Echo in June 2023 significant for EF 60 to 65%, no regional wall motion abnormalities   B12 deficiency Continue with supplements   Persistent atrial fibrillation (HCC) -She is rate controlled, continue with amiodarone and Eliquis   Sick sinus syndrome (HCC) Status post permanent pacemaker, she had recent generation change in March 2023,  Patient beta-blocker has been changed from normal to metoprolol due to bradycardia and low blood pressure   Essential hypertension, benign  Currently her blood pressure is soft,  -Home medication of normal switch to low-dose metoprolol 12.5 mg p.o. twice daily   Anemia of chronic kidney disease -Hemoglobin of 7.5, given her significant symptoms with deconditioning, weakness,  -S/p Transfuse 1 unit PRBC 11/02/2022 - she is on full anticoagulation    Latest Ref Rng & Units 11/04/2022    8:01 AM 11/04/2022    4:21 AM 11/03/2022    4:31 AM  CBC  WBC 4.0 - 10.5 K/uL  4.8  4.9   Hemoglobin 12.0 - 15.0 g/dL 8.4  8.0  8.3   Hematocrit 36.0 - 46.0 % 28.3  26.1  27.6   Platelets 150 - 400 K/uL  134  129    Will monitor closely and evaluated for another unit of PRBC transfusion before discharge   CKd stage IIIb-monitor closely as on IV diuresis Lab Results  Component Value Date   CREATININE 0.87 11/04/2022   CREATININE 0.97  11/03/2022   CREATININE 1.09 (H) 11/02/2022      Hyponatremia -due to volume overload, improved 134, 132   Hypokalemia  -Repleting PO   debility/generalized weaknesses -This post PT OT evaluation, recommending home health   Disposition: Home Diet recommendation:  Discharge Diet Orders (From admission, onward)     Start     Ordered   11/05/22 0000  Diet - low sodium heart healthy        11/05/22 1005           Regular diet DISCHARGE MEDICATION: Allergies as of 11/05/2022       Reactions   Naproxen Hives, Itching, Rash, Other (See Comments)   Iodinated Contrast Media Hives, Itching, Rash, Other (See Comments)   03/13/14: symptoms began within two hours of intrathecal injection (myelogram 03/11/14); on shoulders radiating down to stomach.  Relief with Benadryl.  Told patient she would need to pre-med with Benadryl in the future before receiving this contrast.  Donell Sievert, RN        Medication List     TAKE these medications    acetaminophen 500 MG tablet Commonly known as: TYLENOL Take 1,000 mg by mouth every 6 (six) hours as needed for mild pain.   amiodarone 200 MG tablet Commonly known as: PACERONE TAKE ONE (1) TABLET EACH DAY What changed: See the new instructions.   apixaban 5 MG Tabs tablet Commonly known as: Eliquis Take 1 tablet (5 mg total) by mouth 2 (two) times daily.   baclofen 10 MG tablet Commonly known as: LIORESAL Take 0.5 tablets (5 mg total) by mouth 3 (three) times daily.   Benefiber Powd Take 1 Dose by mouth daily as needed (constipation).   calcium-vitamin D 500-200 MG-UNIT tablet Commonly known as: OSCAL WITH D Take 1 tablet by mouth daily with breakfast.   cyanocobalamin 1000 MCG/ML injection Commonly known as: VITAMIN B12 Inject 1 mL (1,000 mcg total) into the muscle every 30 (thirty) days.   ezetimibe 10 MG tablet Commonly known as: ZETIA Take 1 tablet (10 mg total) by mouth daily.   furosemide 40 MG tablet Commonly  known as: LASIX Take 1 tablet (40 mg total) by mouth daily. May take 2 tablets (80mg ) daily as needed   HYDROcodone-acetaminophen 5-325 MG tablet Commonly known as: NORCO/VICODIN Take 1 tablet by mouth every 6 (six) hours as needed for moderate pain.   latanoprost 0.005 % ophthalmic solution Commonly known as: XALATAN Place 1 drop into both eyes at bedtime.   lidocaine 5 % ointment Commonly known as: XYLOCAINE Apply 1 Application topically as needed.   megestrol 20 MG tablet Commonly known as: MEGACE Take 1 tablet (20 mg total) by mouth 2 (two) times daily. With breakfast and lunch   multivitamin tablet Take 1 tablet by mouth every morning.   nadolol 40 MG tablet Commonly known as: CORGARD Take 0.5 tablets (20 mg total) by mouth 2 (two) times daily. What changed:  how much to take when to take this  nitroGLYCERIN 0.4 MG SL tablet Commonly known as: NITROSTAT DISSOLVE 1 TAB UNDER TOUNGE FOR CHEST PAIN. MAY REPEAT EVERY 5 MINUTES FOR 3 DOSES. IF NO RELIEF CALL 911 OR GO TO ER What changed: See the new instructions.   nystatin cream Commonly known as: MYCOSTATIN Apply 1 Application topically 2 (two) times daily.   OVER THE COUNTER MEDICATION Take 1 tablet by mouth daily. calcium   pantoprazole 40 MG tablet Commonly known as: PROTONIX TAKE ONE (1) TABLET BY MOUTH EVERY DAY               Discharge Care Instructions  (From admission, onward)           Start     Ordered   11/05/22 0000  Discharge wound care:       Comments: Per nursing instructions   11/05/22 1005            Discharge Exam: Filed Weights   11/03/22 0529 11/04/22 0500 11/05/22 0447  Weight: 67 kg 67.9 kg 67.1 kg        General:  AAO x 3,  cooperative, no distress;   HEENT:  Normocephalic, PERRL, otherwise with in Normal limits   Neuro:  CNII-XII intact. , normal motor and sensation, reflexes intact   Lungs:   Clear to auscultation BL, Respirations unlabored,  No wheezes /  crackles  Cardio:    S1/S2, RRR, No murmure, No Rubs or Gallops   Abdomen:  Soft, non-tender, bowel sounds active all four quadrants, no guarding or peritoneal signs.  Muscular  skeletal:  Limited exam -global generalized weaknesses - in bed, able to move all 4 extremities,   2+ pulses,  symmetric, No pitting edema  Skin:  Dry, warm to touch, negative for any Rashes,  Wounds: Please see nursing documentation         Condition at discharge: fair  The results of significant diagnostics from this hospitalization (including imaging, microbiology, ancillary and laboratory) are listed below for reference.   Imaging Studies: DG Chest Port 1 View  Result Date: 11/02/2022 CLINICAL DATA:  Shortness of breath. EXAM: PORTABLE CHEST 1 VIEW COMPARISON:  Oct 21, 2022 FINDINGS: A multi lead AICD is noted with stable lead positioning. The cardiac silhouette is mildly enlarged and unchanged in size. Mild, diffuse, chronic appearing increased interstitial lung markings are noted. A trace amount of residual atelectasis is seen within the left lung base. There is no evidence of acute infiltrate, pleural effusion or pneumothorax. Multilevel degenerative changes seen throughout the thoracic spine. IMPRESSION: Chronic appearing increased interstitial lung markings with a trace amount of residual left basilar atelectasis. Electronically Signed   By: Aram Candela M.D.   On: 11/02/2022 17:57   CT Lumbar Spine Wo Contrast  Result Date: 10/10/2022 CLINICAL DATA:  Back trauma, fall EXAM: CT LUMBAR SPINE WITHOUT CONTRAST TECHNIQUE: Multidetector CT imaging of the lumbar spine was performed without intravenous contrast administration. Multiplanar CT image reconstructions were also generated. RADIATION DOSE REDUCTION: This exam was performed according to the departmental dose-optimization program which includes automated exposure control, adjustment of the mA and/or kV according to patient size and/or use of iterative  reconstruction technique. COMPARISON:  No prior CT lumbar spine available; correlation is made with 07/02/2021 CT abdomen pelvis FINDINGS: Segmentation: 5 lumbar type vertebral bodies. Alignment: Mild dextrocurvature.  No significant listhesis. Vertebrae: Inferior endplate compression fracture at T12 (series 7, image 32), with approximately 15% vertebral body height loss, favored to be acute. No retropulsion of the posterior cortex.  The fracture extends through the anterior cortex bilateral (series 8, image 39). Osteopenia. No other acute fracture or significant vertebral body height loss. No suspicious osseous lesion. Remote right L3 L4 transverse process fractures. Paraspinal and other soft tissues: Aortic atherosclerosis. Diverticulosis without evidence of diverticulitis. Disc levels: Disc heights are well preserved for age. No significant spinal canal stenosis or neural foraminal narrowing. IMPRESSION: 1. Inferior endplate compression fracture at T12, favored to be acute, with approximately 15% vertebral body height loss. No retropulsion of the posterior cortex. 2. No significant spinal canal stenosis or neural foraminal narrowing. These results were called by telephone at the time of interpretation on 10/10/2022 at 2:51 pm to provider BUTLER , who verbally acknowledged these results. Electronically Signed   By: Wiliam Ke M.D.   On: 10/10/2022 14:52   CT Head Wo Contrast  Result Date: 10/10/2022 CLINICAL DATA:  Provided history: Head trauma, minor. Fall. EXAM: CT HEAD WITHOUT CONTRAST TECHNIQUE: Contiguous axial images were obtained from the base of the skull through the vertex without intravenous contrast. RADIATION DOSE REDUCTION: This exam was performed according to the departmental dose-optimization program which includes automated exposure control, adjustment of the mA and/or kV according to patient size and/or use of iterative reconstruction technique. COMPARISON:  Head CT examinations 10/28/2015  and earlier. FINDINGS: Brain: Mild generalized cerebral atrophy. Redemonstrated small focus of hypodensity within the inferior right basal ganglia, likely reflecting a prominent perivascular space. Mineralization within the bilateral basal ganglia. There is no acute intracranial hemorrhage. No demarcated cortical infarct. No extra-axial fluid collection. No evidence of an intracranial mass. No midline shift. Vascular: No hyperdense vessel.  Atherosclerotic calcifications. Skull: No fracture or aggressive osseous lesion. Sinuses/Orbits: No mass or acute finding within the imaged orbits. Incompletely imaged mucosal thickening within the left maxillary sinus, at least moderate in severity. IMPRESSION: 1.  No evidence of an acute intracranial abnormality. 2. Mild generalized cerebral atrophy. 3. Incompletely imaged mucosal thickening within the left maxillary sinus, at least moderate. Electronically Signed   By: Jackey Loge D.O.   On: 10/10/2022 14:41    Microbiology: Results for orders placed or performed in visit on 08/24/22  Microscopic Examination     Status: Abnormal   Collection Time: 08/24/22 12:01 PM   Urine  Result Value Ref Range Status   WBC, UA 0-5 0 - 5 /hpf Final   RBC, Urine None seen 0 - 2 /hpf Final   Epithelial Cells (non renal) 0-10 0 - 10 /hpf Final   Renal Epithel, UA None seen None seen /hpf Final   Bacteria, UA Few (A) None seen/Few Final    Labs: CBC: Recent Labs  Lab 11/02/22 1628 11/03/22 0431 11/04/22 0421 11/04/22 0801 11/05/22 0356  WBC 4.9 4.9 4.8  --  4.5  NEUTROABS 2.7  --   --   --   --   HGB 7.5* 8.3* 8.0* 8.4* 8.5*  HCT 25.0* 27.6* 26.1* 28.3* 28.4*  MCV 84.5 84.7 83.7  --  85.5  PLT 141* 129* 134*  --  147*   Basic Metabolic Panel: Recent Labs  Lab 11/02/22 1628 11/03/22 0431 11/04/22 0421 11/05/22 0356  NA 132* 134* 132* 135  K 3.7 3.2* 3.3* 3.6  CL 94* 99 96* 98  CO2 28 27 27 25   GLUCOSE 110* 90 100* 103*  BUN 28* 22 17 16   CREATININE  1.09* 0.97 0.87 0.92  CALCIUM 8.8* 8.7* 9.1 9.0  MG  --   --  2.2  --  Liver Function Tests: No results for input(s): "AST", "ALT", "ALKPHOS", "BILITOT", "PROT", "ALBUMIN" in the last 168 hours. CBG: No results for input(s): "GLUCAP" in the last 168 hours.  Discharge time spent: greater than 40 minutes.  Signed: Kendell Bane, MD [facility] 11/05/2022

## 2022-11-05 NOTE — TOC Transition Note (Signed)
Transition of Care [facility]) - CM/SW Discharge Note   Patient Details  Name: [name] MRN: [id number] Date of Birth: [birth year]  Transition of Care (TOC) CM/SW Contact:  Catalina Gravel, LCSW Phone Number: 11/05/2022, 12:55 PM   Clinical Narrative:    Pt ready for DC today.  CSW shared with MD vi Petal that pt active with HHPT/OT and RN new orders needed at DC.  CSW contacted Jacquenette Shone to advise of DC.      Barriers to Discharge: No Barriers Identified   Patient Goals and CMS Choice CMS Medicare.gov Compare Post Acute Care list provided to:: Patient Represenative (must comment) Choice offered to / list presented to : Patient, Adult Children  Discharge Placement                         Discharge Plan and Services Additional resources added to the After Visit Summary for   In-house Referral: Clinical Social Work Discharge Planning Services: CM Consult Post Acute Care Choice: Home Health                      [facility] Agency: [facility] Date [facility] Agency Contacted: 11/03/22   Representative spoke with at [facility] Agency: Kandee Keen  Social Determinants of Health (SDOH) Interventions SDOH Screenings   Food Insecurity: No Food Insecurity (11/02/2022)  Housing: Low Risk  (11/02/2022)  Transportation Needs: No Transportation Needs (11/02/2022)  Utilities: Not At Risk (11/02/2022)  Alcohol Screen: Low Risk  (01/26/2022)  Depression (PHQ2-9): Medium Risk (10/12/2022)  Financial Resource Strain: Low Risk  (01/26/2022)  Physical Activity: Insufficiently Active (01/26/2022)  Social Connections: Moderately Integrated (01/26/2022)  Stress: No Stress Concern Present (01/26/2022)  Tobacco Use: Low Risk  (11/02/2022)     Readmission Risk Interventions     No data to display

## 2022-11-07 ENCOUNTER — Ambulatory Visit: Payer: Medicare Other | Attending: Nurse Practitioner | Admitting: Nurse Practitioner

## 2022-11-07 ENCOUNTER — Encounter: Payer: Self-pay | Admitting: Nurse Practitioner

## 2022-11-07 VITALS — BP 104/62 | HR 64 | Ht 63.0 in | Wt 146.2 lb

## 2022-11-07 DIAGNOSIS — I480 Paroxysmal atrial fibrillation: Secondary | ICD-10-CM

## 2022-11-07 DIAGNOSIS — Z86718 Personal history of other venous thrombosis and embolism: Secondary | ICD-10-CM | POA: Diagnosis not present

## 2022-11-07 DIAGNOSIS — I495 Sick sinus syndrome: Secondary | ICD-10-CM

## 2022-11-07 DIAGNOSIS — I4892 Unspecified atrial flutter: Secondary | ICD-10-CM | POA: Diagnosis not present

## 2022-11-07 DIAGNOSIS — Z950 Presence of cardiac pacemaker: Secondary | ICD-10-CM

## 2022-11-07 DIAGNOSIS — E782 Mixed hyperlipidemia: Secondary | ICD-10-CM | POA: Diagnosis not present

## 2022-11-07 DIAGNOSIS — R0609 Other forms of dyspnea: Secondary | ICD-10-CM | POA: Diagnosis not present

## 2022-11-07 DIAGNOSIS — I6523 Occlusion and stenosis of bilateral carotid arteries: Secondary | ICD-10-CM

## 2022-11-07 DIAGNOSIS — I5032 Chronic diastolic (congestive) heart failure: Secondary | ICD-10-CM

## 2022-11-07 MED ORDER — NITROGLYCERIN 0.4 MG SL SUBL: 0.4000 mg | SUBLINGUAL_TABLET | SUBLINGUAL | 3 refills | Status: DC | PRN

## 2022-11-07 NOTE — Progress Notes (Signed)
Cardiology Office Note:    Date:  11/07/2022   ID:  [name], DOB [birth year], MRN [id number]  PCP:  Dettinger, Elige Radon, MD   [facility] Providers Cardiologist:  Nona Dell, MD Electrophysiologist:  Hillis Range, MD (Inactive)      Referring MD: Dettinger, Elige Radon, MD   CC: Here for hospital follow-up  History of Present Illness:    [name] is a very pleasant 89 y.o. female with a hx of the following:  Persistent A-fib A-flutter, s/p RFA in 2003 Hx of bilateral DVT HTN Mixed HLD Hx of breast cancer, s/p lumpectomy SSS, s/p PPM HFpEF  In June 2023 she was hospitalized with shortness of breath and weakness and was treated for suspected acute on chronic heart failure with preserved ejection fraction.  Echocardiogram revealed EF 60 to 65%, no RWMA, moderate LVH, mildly enlarged right ventricular size, right atrial size was severely dilated, aortic sclerosis without stenosis, no other significant valvular abnormalities. Followed by EP for history of pacemaker.   Last seen by Dr. Diona Browner on January 17, 2022 and was doing well.  Weight was stable, however did note intermittent leg swelling.  Dr. Diona Browner stated she could take an additional 20 mg of Lasix in the afternoon with 2 pound weight gain in 24 hours or with increasing swelling in her legs.  Was told to follow-up in 6 months.  02/2022 - Presented to [facility] ED with chest pain. Noted shortness of breath related to exertion and associated with orthopnea with mild chest tightness, also noted generalized fatigue, had intermittent cough that was productive with clear/white sputum.  BNP mildly elevated at 369, was given Lasix 40 mg IV with DuoNeb. Troponin negative x 2.  COVID and flu negative.  CXR negative for anything acute.  Was treated for mild CHF exacerbation.  Presented back to [facility] ED 04/2022 with chief complaint of chest pain. Workup was overall unremarkable.  CXR was negative.  Was given  2 mg of morphine, symptoms improved.  Advised to increase Lasix to 60 mg daily x 3 days and to follow-up with primary doctor, due to the fact she had similar symptoms related to fluid before and did note a several pound weight gain around that time.  Was discharged.  Presented back to [facility] ED later that month with chest pain.  Was tender to palpation on right side of chest wall.  Troponins negative.  EKG unremarkable.   I saw her for follow-up on May 19, 2022.  Was doing well from a cardiac perspective.  Did admit to some fatigue at times. Wt is stable. Was tolerating medications well.    Saw Dr. Diona Browner in February 2024.  Was continuing to do well.  Had ED visit at [facility] with chief complaint of back pain in March 2024.  Also noted dyspnea.  EKG showed AV dual paced rhythm.  Inflammatory changes were noted in the left upper lobe, suspicious for pleurisy.  Was prescribed prednisone.  Presented to the ED end of April 2024 due to a fall after getting out of the bathtub, reported low back pain.  Scraped the back of her head during the fall.  Denied LOC.  Lumbar spine CT showed T12 compression fracture.  Was placed in TLSO brace.  Was scheduled to follow-up with neurosurgery.  A few weeks later, she presented to [facility] complaining of constipation, decreased p.o. intake/appetite.  Lab work revealed hyponatremia with sodium level at 127.  proBNP 2,082.  Given worsening of her T12 inferior endplate fracture, was recommended to transfer to higher level of care.  She was having retropulsion of cortex into anterior canal.  Hospital admission 10/21/2022 - 11/02/2022 at [facility] for acute on chronic CHF exacerbation.  She noted having worsening edema and dyspnea over the past several days.  Dry weight was 149, weight day prior to hospital admission was 157.  Despite increasing Lasix dosage per recommendations from cardiology, and given worsening dyspnea and fatigue, came to ED for further evaluation.   BNP 829.  Vascular congestion noted on chest x-ray.  Was given IV Lasix.  Was also given 1 unit PRBC.  Today she presents for follow-up with her son.  Overall doing well from a cardiac perspective. Weight is stable per son's report. Denies any chest pain, palpitations, syncope, presyncope, dizziness, orthopnea, PND, swelling or significant weight changes, acute bleeding, or claudication. Working with therapy per son's report. Has follow-up visit soon with PCP this week. Occasional, intermittent DOE, chronic and stable per her report. Denies any other questions or concerns.   Past Medical History:  Diagnosis Date   Arthritis    Atrial flutter (HCC)    s/p RFA 2003   Breast cancer (HCC)    1980's - lumpectomy only   Colon polyps    Diverticulosis    DVT, lower extremity (HCC) 1998 and 1999   Bilateral   Essential hypertension    Gallstones    GERD (gastroesophageal reflux disease)    Glaucoma    IBS (irritable bowel syndrome)    Mixed hyperlipidemia    Osteoporosis    Persistent atrial fibrillation (HCC)    Pneumonia 2011   Sick sinus syndrome (HCC)    PPM (SJM)   Urinary tract infection    Varicose veins     Past Surgical History:  Procedure Laterality Date   BIOPSY  07/04/2021   Procedure: BIOPSY;  Surgeon: Tressia Danas, MD;  Location: [facility] ENDOSCOPY;  Service: Gastroenterology;;   BREAST LUMPECTOMY  [id number]   Right breast    CARDIAC CATHETERIZATION  1999   Normal coronaries   Cataracts Bilateral    CHOLECYSTECTOMY     COLONOSCOPY W/ BIOPSIES AND POLYPECTOMY  03/2003, 05/2008, 02/20/2011   severe diverticulosis, tubulovillous adenoma polyp, internal and external hemorrhoids 2012: severe diverticulosis, 4 mm polyp, hemorrhoids   CYST REMOVAL TRUNK     under breast = on belly    DILATION AND CURETTAGE OF UTERUS     ESOPHAGOGASTRODUODENOSCOPY (EGD) WITH PROPOFOL N/A 07/04/2021   Procedure: ESOPHAGOGASTRODUODENOSCOPY (EGD) WITH PROPOFOL;  Surgeon: Tressia Danas, MD;   Location: [facility] ENDOSCOPY;  Service: Gastroenterology;  Laterality: N/A;   EYE SURGERY     cataracts - bilateral    Fractured wrist Left 2013   repair   INSERT / REPLACE / REMOVE PACEMAKER     KNEE ARTHROSCOPY WITH MEDIAL MENISECTOMY Right 10/21/2014   Procedure: RIGHT KNEE ARTHROSCOPY WITH MEDIAL MENISECTOM,lateral menisectomy,synovectomy suprapatellar pouch;  Surgeon: Ranee Gosselin, MD;  Location: [facility] ORS;  Service: Orthopedics;  Laterality: Right;   PACEMAKER GENERATOR CHANGE  06/18/12   SJM [facility] DR RF, Dr Johney Frame   PACEMAKER INSERTION  2003   PERMANENT PACEMAKER GENERATOR CHANGE N/A 06/18/2012   Procedure: PERMANENT PACEMAKER GENERATOR CHANGE;  Surgeon: Hillis Range, MD;  Location: [facility] CATH LAB;  Service: Cardiovascular;  Laterality: N/A;   PPM GENERATOR CHANGEOUT N/A 09/30/2021   Procedure: PPM GENERATOR CHANGEOUT;  Surgeon: Hillis Range, MD;  Location: [facility] INVASIVE CV LAB;  Service: Cardiovascular;  Laterality: N/A;   TOTAL KNEE ARTHROPLASTY  12/20/2011   Procedure: TOTAL KNEE ARTHROPLASTY;  Surgeon: Jacki Cones, MD;  Location: [facility] ORS;  Service: Orthopedics;  Laterality: Left;   TOTAL KNEE ARTHROPLASTY Right 02/10/2015   Procedure: TOTAL RIGHT KNEE ARTHROPLASTY;  Surgeon: Ranee Gosselin, MD;  Location: [facility] ORS;  Service: Orthopedics;  Laterality: Right;    Current Medications: Current Meds  Medication Sig   acetaminophen (TYLENOL) 500 MG tablet Take 1,000 mg by mouth every 6 (six) hours as needed for mild pain.   amiodarone (PACERONE) 200 MG tablet TAKE ONE (1) TABLET EACH DAY (Patient taking differently: Take 200 mg by mouth daily.)   apixaban (ELIQUIS) 5 MG TABS tablet Take 1 tablet (5 mg total) by mouth 2 (two) times daily.   calcium-vitamin D (OSCAL WITH D) 500-200 MG-UNIT tablet Take 1 tablet by mouth daily with breakfast.    cyanocobalamin (VITAMIN B12) 1000 MCG/ML injection Inject 1 mL (1,000 mcg total) into the muscle every 30 (thirty) days.   ezetimibe (ZETIA) 10 MG tablet Take 1  tablet (10 mg total) by mouth daily.   furosemide (LASIX) 40 MG tablet Take 1 tablet (40 mg total) by mouth daily. May take 2 tablets (80mg ) daily as needed   HYDROcodone-acetaminophen (NORCO/VICODIN) 5-325 MG tablet Take 1 tablet by mouth every 6 (six) hours as needed for moderate pain.   latanoprost (XALATAN) 0.005 % ophthalmic solution Place 1 drop into both eyes at bedtime.   megestrol (MEGACE) 20 MG tablet Take 1 tablet (20 mg total) by mouth 2 (two) times daily. With breakfast and lunch   Multiple Vitamin (MULTIVITAMIN) tablet Take 1 tablet by mouth every morning.   nadolol (CORGARD) 40 MG tablet Take 0.5 tablets (20 mg total) by mouth 2 (two) times daily.   pantoprazole (PROTONIX) 40 MG tablet TAKE ONE (1) TABLET BY MOUTH EVERY DAY   Wheat Dextrin (BENEFIBER) POWD Take 1 Dose by mouth daily as needed (constipation).   nitroGLYCERIN (NITROSTAT) 0.4 MG SL tablet DISSOLVE 1 TAB UNDER TOUNGE FOR CHEST PAIN. MAY REPEAT EVERY 5 MINUTES FOR 3 DOSES. IF NO RELIEF CALL 911 OR GO TO ER (Patient taking differently: Place 0.4 mg under the tongue every 5 (five) minutes as needed for chest pain.)     Allergies:   Naproxen and Iodinated contrast media   Social History   Socioeconomic History   Marital status: Widowed    Spouse name: Not on file   Number of children: 5   Years of education: 21   Highest education level: 12th grade  Occupational History   Occupation: Retired    Comment: resturant / cafe   Tobacco Use   Smoking status: Never   Smokeless tobacco: Never  Vaping Use   Vaping Use: Never used  Substance and Sexual Activity   Alcohol use: No    Alcohol/week: 0.0 standard drinks of alcohol   Drug use: No   Sexual activity: Not on file  Other Topics Concern   Not on file  Social History Narrative   Widowed  - 5 sons = 1 lives local    Retired.   Never smoker no alcohol   Social Determinants of Health   Financial Resource Strain: Low Risk  (01/26/2022)   Overall Financial  Resource Strain (CARDIA)    Difficulty of Paying Living Expenses: Not hard at all  Food Insecurity: No Food Insecurity (11/02/2022)   Hunger Vital Sign    Worried About Running Out of Food in the Last  Year: Never true    Ran Out of Food in the Last Year: Never true  Transportation Needs: No Transportation Needs (11/02/2022)   PRAPARE - Administrator, Civil Service (Medical): No    Lack of Transportation (Non-Medical): No  Physical Activity: Insufficiently Active (01/26/2022)   Exercise Vital Sign    Days of Exercise per Week: 7 days    Minutes of Exercise per Session: 20 min  Stress: No Stress Concern Present (01/26/2022)   [employer] of Occupational Health - Occupational Stress Questionnaire    Feeling of Stress : Not at all  Social Connections: Moderately Integrated (01/26/2022)   Social Connection and Isolation Panel [NHANES]    Frequency of Communication with Friends and Family: More than three times a week    Frequency of Social Gatherings with Friends and Family: More than three times a week    Attends Religious Services: More than 4 times per year    Active Member of [employer] or Organizations: Yes    Attends Banker Meetings: More than 4 times per year    Marital Status: Widowed     Family History: The patient's family history includes Alzheimer's disease in her brother, mother, and sister; Atrial fibrillation in her brother; Colon cancer in her brother, maternal grandfather, and sister; Dementia in her brother; Diabetes in her brother; Heart attack in her paternal grandmother; Heart disease in her brother and paternal grandmother; Parkinson's disease in her brother; Prostate cancer in her son and son; Stomach cancer in her maternal grandmother; Stroke in her mother; Throat cancer in her father.  ROS:     Please see the history of present illness.    All other systems reviewed and are negative.  EKGs/Labs/Other Studies Reviewed:    The following  studies were reviewed today:   EKG:  EKG is not ordered today.  EKG dated 05/11/22 revealed AV paced rhythm.   Carotid duplex 05/2022: Summary:  Right Carotid: Velocities in the right ICA are consistent with a 1-39% stenosis.   Left Carotid: Velocities in the left ICA are consistent with a 1-39% stenosis.   Vertebrals: Bilateral vertebral arteries demonstrate antegrade flow. Subclavians: Normal flow hemodynamics were seen in bilateral subclavian arteries.   Limited echo on November 24, 2021:  1. Limited study with limited images   2. Left ventricular ejection fraction, by estimation, is 60 to 65%. Left  ventricular ejection fraction by 2D MOD biplane is 63.6 %. The left  ventricle has normal function. The left ventricle has no regional wall  motion abnormalities. There is moderate  left ventricular hypertrophy.   3. The right ventricular size is mildly enlarged. Tricuspid regurgitation  signal is inadequate for assessing PA pressure.   4. Right atrial size was severely dilated.   5. The aortic valve is tricuspid. Aortic valve regurgitation is not  visualized. Aortic valve sclerosis/calcification is present, without any  evidence of aortic stenosis.   6. The inferior vena cava is dilated in size with <50% respiratory  variability, suggesting right atrial pressure of 15 mmHg.   Comparison(s): No significant change from prior study. 07/03/2021: LVEF  60-65%.  2D complete echo on July 03, 2021:  1. Left ventricular ejection fraction, by estimation, is 60 to 65%. The  left ventricle has normal function. The left ventricle has no regional  wall motion abnormalities. There is moderate left ventricular hypertrophy.  Left ventricular diastolic  parameters are indeterminate.   2. Right ventricular systolic function is mildly reduced. The  right  ventricular size is mildly enlarged. Moderately increased right  ventricular wall thickness. There is moderately elevated pulmonary artery   systolic pressure. The estimated right  ventricular systolic pressure is 53.7 mmHg.   3. Left atrial size was moderately dilated.   4. Right atrial size was moderately dilated.   5. A small pericardial effusion is present.   6. The mitral valve is degenerative. Mild mitral valve regurgitation.   7. Tricuspid valve regurgitation is moderate.   8. The aortic valve is tricuspid. Aortic valve regurgitation is not  visualized. Mild to moderate aortic valve stenosis. Vmax 2.4 m/s, MG 12  mmHg, AVA 1.2 cm^2, DI 0.35   9. The inferior vena cava is dilated in size with <50% respiratory  variability, suggesting right atrial pressure of 15 mmHg.  Myoview on December 15, 2020: There was no ST segment deviation noted during stress. The study is normal. This is a low risk study. The left ventricular ejection fraction is normal (55-65%).   Normal resting and stress perfusion. No ischemia or infarction EF 60 % Baseline ECG AV pacing    Recent Labs: 09/15/2022: TSH 1.430 10/12/2022: ALT 18 11/04/2022: Magnesium 2.2 11/05/2022: B Natriuretic Peptide 859.0; BUN 16; Creatinine, Ser 0.92; Hemoglobin 8.5; Platelets 147; Potassium 3.6; Sodium 135  Recent Lipid Panel    Component Value Date/Time   CHOL 143 09/15/2022 1505   TRIG 82 09/15/2022 1505   HDL 60 09/15/2022 1505   CHOLHDL 2.4 09/15/2022 1505   LDLCALC 67 09/15/2022 1505     Risk Assessment/Calculations:    CHA2DS2-VASc Score = 5  This indicates a 7.2% annual risk of stroke. The patient's score is based upon: CHF History: 1 HTN History: 1 Diabetes History: 0 Stroke History: 0 Vascular Disease History: 0 Age Score: 2 Gender Score: 1    Physical Exam:    VS:  BP 104/62   Pulse 64   Ht 5\' 3"  (1.6 m)   Wt 146 lb 3.2 oz (66.3 kg)   BMI 25.90 kg/m     Wt Readings from Last 3 Encounters:  11/07/22 146 lb 3.2 oz (66.3 kg)  11/05/22 147 lb 14.9 oz (67.1 kg)  10/12/22 153 lb (69.4 kg)     GEN: Thin, frail 89 year old female in no  acute distress HEENT: Normal NECK: No JVD; Mild right carotid bruit and no carotid bruit on left  CARDIAC: S1/S2, RRR, no murmurs, rubs, gallops; 2+ peripheral pulses throughout, strong and equal bilaterally RESPIRATORY:  Clear and diminished to auscultation without rales, wheezing or rhonchi  MUSCULOSKELETAL:  No edema; Thoracic kyphosis noted, otherwise no deformity noted SKIN: Thin skin, warm and dry NEUROLOGIC:  Alert and oriented x 3 PSYCHIATRIC:  Normal affect   ASSESSMENT:    1. PAF (paroxysmal atrial fibrillation) (HCC)   2. Atrial flutter, unspecified type (HCC)   3. SSS (sick sinus syndrome) (HCC)   4. Presence of cardiac pacemaker   5. Chronic heart failure with preserved ejection fraction (HCC)   6. Mixed hyperlipidemia   7. Bilateral carotid artery stenosis   8. History of DVT (deep vein thrombosis)   9. DOE (dyspnea on exertion)     PLAN:    In order of problems listed above:  PAF; A-flutter s/p RFA in 2003 Denies any tachycardia or palpitations.  CHA2DS2-VASc score is 5.  Currently on appropriate dosage of Eliquis.  Continue Eliquis 5 mg twice daily.  Continue amiodarone.  Recent TSH and LFTs normal. Heart healthy diet and regular cardiovascular exercise  encouraged.   2.  SSS, status post PPM Most recent remote device check revealed normal device function without any events or anything acute.  Continue to follow-up with EP.  3.  Chronic heart failure with preserved ejection fraction Stage C, NYHA class II-III symptoms, chronic and stable per her report. Limited echo in June 2023 revealed EF 60 to 65%, moderate LVH. Euvolemic and well compensated on exam. Low sodium diet, fluid restriction <2L, and daily weights encouraged. Educated to contact our office for weight gain of 2 lbs overnight or 5 lbs in one week.  Continue current medication regimen. Heart healthy diet and regular cardiovascular exercise encouraged.   4.  Mixed hyperlipidemia Labs from October 2023  revealed total cholesterol 162, HDL 57, LDL 90, and triglycerides 76. Continue Zetia. Heart healthy diet and regular cardiovascular exercise encouraged. Defers rechecking FLP to Dr. Louanne Skye, has upcoming OV. Will [name] to him with recs.   5.  Carotid artery stenosis Mild bilateral carotid artery stenosis, 1 to 39%.  Asymptomatic.  Continue current medication regimen. Heart healthy diet and regular cardiovascular exercise encouraged.   6.  History of bilateral DVT Denies any leg swelling or claudication.  She is compliant with Eliquis.  Denies any bleeding issues.  Continue Eliquis 5 mg twice daily.  8.  DOE Etiology multifactorial.  Do believe some of this is related to deconditioning.  Continue to work with therapy.  She currently defers blood work to PCP.  Has upcoming appointment in office in 2 days. Continue to follow with PCP.    9.  Disposition: Will refill nitroglycerin per her request.  Follow-up with Dr. Diona Browner as scheduled or sooner if anything changes.  Medication Adjustments/Labs and Tests Ordered: Current medicines are reviewed at length with the patient today.  Concerns regarding medicines are outlined above.  No orders of the defined types were placed in this encounter.  Meds ordered this encounter  Medications   nitroGLYCERIN (NITROSTAT) 0.4 MG SL tablet    Sig: Place 1 tablet (0.4 mg total) under the tongue every 5 (five) minutes as needed for chest pain. DISSOLVE 1 TAB UNDER TOUNGE FOR CHEST PAIN. MAY REPEAT EVERY 5 MINUTES FOR 3 DOSES. IF NO RELIEF CALL 911 OR GO TO ER Strength: 0.4 mg    Dispense:  25 tablet    Refill:  3    Patient Instructions  Medication Instructions:   Nitroglycerin refilled today.  Continue all other medications.     Labwork:  none  Testing/Procedures:  none  Follow-Up:  As planned with Dr. Diona Browner   Any Other Special Instructions Will Be Listed Below (If Applicable).   If you need a refill on your cardiac medications before  your next appointment, please call your pharmacy.    Signed, Sharlene Dory, NP  11/07/2022 3:51 PM    [facility]

## 2022-11-07 NOTE — Patient Instructions (Addendum)
Medication Instructions:   Nitroglycerin refilled today.  Continue all other medications.     Labwork:  none  Testing/Procedures:  none  Follow-Up:  As planned with Dr. Diona Browner   Any Other Special Instructions Will Be Listed Below (If Applicable).   If you need a refill on your cardiac medications before your next appointment, please call your pharmacy.

## 2022-11-08 ENCOUNTER — Telehealth: Payer: Self-pay | Admitting: *Deleted

## 2022-11-08 DIAGNOSIS — D509 Iron deficiency anemia, unspecified: Secondary | ICD-10-CM | POA: Diagnosis not present

## 2022-11-08 DIAGNOSIS — S22088D Other fracture of T11-T12 vertebra, subsequent encounter for fracture with routine healing: Secondary | ICD-10-CM | POA: Diagnosis not present

## 2022-11-08 DIAGNOSIS — Z7901 Long term (current) use of anticoagulants: Secondary | ICD-10-CM | POA: Diagnosis not present

## 2022-11-08 DIAGNOSIS — E785 Hyperlipidemia, unspecified: Secondary | ICD-10-CM | POA: Diagnosis not present

## 2022-11-08 DIAGNOSIS — I10 Essential (primary) hypertension: Secondary | ICD-10-CM | POA: Diagnosis not present

## 2022-11-08 DIAGNOSIS — Z9181 History of falling: Secondary | ICD-10-CM | POA: Diagnosis not present

## 2022-11-08 DIAGNOSIS — I4819 Other persistent atrial fibrillation: Secondary | ICD-10-CM | POA: Diagnosis not present

## 2022-11-08 DIAGNOSIS — Z950 Presence of cardiac pacemaker: Secondary | ICD-10-CM | POA: Diagnosis not present

## 2022-11-08 NOTE — Transitions of Care (Post Inpatient/ED Visit) (Signed)
   11/08/2022  Name: [name] MRN: [id number] DOB: 1929-05-16  Today's TOC FU Call Status: Today's TOC FU Call Status:: Unsuccessul Call (1st Attempt) Unsuccessful Call (1st Attempt) Date: 11/08/22  Attempted to reach the patient regarding the most recent Inpatient/ED visit.  Patient was seen by cardio yesterday and has an existing follow-up appt scheduled with PCP tomorrow. Reached out to PCP's nurse to advise that she was recently discharged from the hospital for CHF and that we are attempting TOC calls.   Follow Up Plan: Additional outreach attempts will be made to reach the patient to complete the Transitions of Care (Post Inpatient/ED visit) call.    Demetrios Loll, BSN, RN-BC RN Care Coordinator [facility]  [facility] Direct Dial: [phone] Main #: [phone]

## 2022-11-09 ENCOUNTER — Telehealth: Payer: Self-pay

## 2022-11-09 ENCOUNTER — Ambulatory Visit (INDEPENDENT_AMBULATORY_CARE_PROVIDER_SITE_OTHER): Payer: Medicare Other | Admitting: Family Medicine

## 2022-11-09 ENCOUNTER — Encounter: Payer: Self-pay | Admitting: Family Medicine

## 2022-11-09 VITALS — BP 112/57 | HR 60 | Ht 63.0 in | Wt 148.0 lb

## 2022-11-09 DIAGNOSIS — D509 Iron deficiency anemia, unspecified: Secondary | ICD-10-CM | POA: Diagnosis not present

## 2022-11-09 DIAGNOSIS — Z7901 Long term (current) use of anticoagulants: Secondary | ICD-10-CM | POA: Diagnosis not present

## 2022-11-09 DIAGNOSIS — Z950 Presence of cardiac pacemaker: Secondary | ICD-10-CM | POA: Diagnosis not present

## 2022-11-09 DIAGNOSIS — I4819 Other persistent atrial fibrillation: Secondary | ICD-10-CM

## 2022-11-09 DIAGNOSIS — I5042 Chronic combined systolic (congestive) and diastolic (congestive) heart failure: Secondary | ICD-10-CM

## 2022-11-09 DIAGNOSIS — Z9181 History of falling: Secondary | ICD-10-CM | POA: Diagnosis not present

## 2022-11-09 DIAGNOSIS — E785 Hyperlipidemia, unspecified: Secondary | ICD-10-CM | POA: Diagnosis not present

## 2022-11-09 DIAGNOSIS — S22088D Other fracture of T11-T12 vertebra, subsequent encounter for fracture with routine healing: Secondary | ICD-10-CM | POA: Diagnosis not present

## 2022-11-09 DIAGNOSIS — I10 Essential (primary) hypertension: Secondary | ICD-10-CM | POA: Diagnosis not present

## 2022-11-09 LAB — CMP14+EGFR

## 2022-11-09 LAB — CBC WITH DIFFERENTIAL/PLATELET
Basophils Absolute: 0 10*3/uL (ref 0.0–0.2)
EOS (ABSOLUTE): 0.2 10*3/uL (ref 0.0–0.4)
Hematocrit: 28.3 % — ABNORMAL LOW (ref 34.0–46.6)
MCHC: 30 g/dL — ABNORMAL LOW (ref 31.5–35.7)
Monocytes Absolute: 0.7 10*3/uL (ref 0.1–0.9)
Monocytes: 16 %
RBC: 3.4 x10E6/uL — ABNORMAL LOW (ref 3.77–5.28)

## 2022-11-09 LAB — LIPID PANEL

## 2022-11-09 LAB — BRAIN NATRIURETIC PEPTIDE

## 2022-11-09 MED ORDER — MEGESTROL ACETATE 40 MG PO TABS: 40.0000 mg | ORAL_TABLET | Freq: Two times a day (BID) | ORAL | 1 refills | Status: DC

## 2022-11-09 NOTE — Transitions of Care (Post Inpatient/ED Visit) (Signed)
11/09/2022  Name: [name] MRN: [id number] DOB: 1928-06-30  Today's TOC FU Call Status: Today's TOC FU Call Status:: Successful TOC FU Call Competed TOC FU Call Complete Date: 11/09/22  Transition Care Management Follow-up Telephone Call Date of Discharge: 11/05/22 [facility] Type of Discharge: Inpatient Admission Primary Inpatient Discharge Diagnosis:: Acute on Chronic Combined Systolic and Diastolic Congestive Heart Failure How have you been since you were released from the hospital?: Better Any questions or concerns?: No  Items Reviewed: Did you receive and understand the discharge instructions provided?: Yes Medications obtained,verified, and reconciled?: Yes (Medications Reviewed) Any new allergies since your discharge?: No Dietary orders reviewed?: Yes Type of Diet Ordered:: Low Sodium, Heart Healthy Do you have support at home?: Yes People in Home: child(ren), adult Name of Support/Comfort Primary Source: Harvie Heck  Medications Reviewed Today: Medications Reviewed Today     Reviewed by Jodelle Gross, RN (Case Manager) on 11/09/22 at 1450  Med List Status: <None>   Medication Order Taking? Sig Documenting Provider Last Dose Status Informant  acetaminophen (TYLENOL) 500 MG tablet [id number]  Take 1,000 mg by mouth every 6 (six) hours as needed for mild pain. [provider]  Active Self, Pharmacy Records  amiodarone (PACERONE) 200 MG tablet [id number] Yes TAKE ONE (1) TABLET EACH DAY  Patient taking differently: Take 200 mg by mouth daily.   Dettinger, Elige Radon, MD Taking Active   apixaban (ELIQUIS) 5 MG TABS tablet [id number] Yes Take 1 tablet (5 mg total) by mouth 2 (two) times daily. Dettinger, Elige Radon, MD Taking Active   bupivacaine liposome (EXPAREL) 1.3 % injection 266 mg [id number]   [place], [facility], PA-C  Active   calcium-vitamin D (OSCAL WITH D) 500-200 MG-UNIT tablet [id number] No Take 1 tablet by mouth daily with  breakfast.  [provider] Unknown Active Self, Pharmacy Records  cyanocobalamin (VITAMIN B12) 1000 MCG/ML injection [id number] No Inject 1 mL (1,000 mcg total) into the muscle every 30 (thirty) days. Dettinger, Elige Radon, MD Unknown Active Self, Pharmacy Records  ezetimibe (ZETIA) 10 MG tablet [id number] Yes Take 1 tablet (10 mg total) by mouth daily. Jonelle Sidle, MD Taking Active   furosemide (LASIX) 40 MG tablet [id number] Yes Take 1 tablet (40 mg total) by mouth daily. May take 2 tablets (80mg ) daily as needed Antoine Poche, MD Taking Active   HYDROcodone-acetaminophen (NORCO/VICODIN) 5-325 MG tablet [id number] Yes Take 1 tablet by mouth every 6 (six) hours as needed for moderate pain. [provider] Taking Active   latanoprost (XALATAN) 0.005 % ophthalmic solution [id number] Yes Place 1 drop into both eyes at bedtime. [provider] Taking Active Self, Pharmacy Records  megestrol (MEGACE) 20 MG tablet [id number] Yes Take 1 tablet (20 mg total) by mouth 2 (two) times daily. With breakfast and lunch Bennie Pierini, FNP Taking Active   Multiple Vitamin (MULTIVITAMIN) tablet [id number] Yes Take 1 tablet by mouth every morning. [provider] Taking Active Self, Pharmacy Records           Med Note Gasper Lloyd, ANAIS   Fri Jan 29, 2015 11:38 AM)     nadolol (CORGARD) 40 MG tablet [id number] Yes Take 0.5 tablets (20 mg total) by mouth 2 (two) times daily. Kendell Bane, MD Taking Active   nitroGLYCERIN (NITROSTAT) 0.4 MG SL tablet [id number] Yes Place 1 tablet (0.4 mg total) under the tongue every 5 (five) minutes as needed for chest pain. DISSOLVE 1 TAB UNDER TOUNGE FOR CHEST PAIN. MAY REPEAT  EVERY 5 MINUTES FOR 3 DOSES. IF NO RELIEF CALL 911 OR GO TO ER Strength: 0.4 mg Sharlene Dory, NP Taking Active   pantoprazole (PROTONIX) 40 MG tablet [id number] Yes TAKE ONE (1) TABLET BY MOUTH EVERY DAY Dettinger, Elige Radon, MD Taking Active   Wheat Dextrin  [facility]) POWD [id number] No Take 1 Dose by mouth daily as needed (constipation). [provider] Unknown Active Self, Pharmacy Records            Home Care and Equipment/Supplies: Were Home Health Services Ordered?: Yes Name of Home Health Agency:: [facility] Has Agency set up a time to come to your home?: Yes First Home Health Visit Date: 11/08/22 Any new equipment or medical supplies ordered?: No  Functional Questionnaire: Do you need assistance with bathing/showering or dressing?: No Do you need assistance with meal preparation?: No Do you need assistance with eating?: No Do you have difficulty maintaining continence: No Do you need assistance with getting out of bed/getting out of a chair/moving?: No Do you have difficulty managing or taking your medications?: No  Follow up appointments reviewed: PCP Follow-up appointment confirmed?: Yes Date of PCP follow-up appointment?: 11/09/22 Follow-up Provider: Dr. Louanne Skye Specialist [facility] Follow-up appointment confirmed?: NA Do you need transportation to your follow-up appointment?: No Do you understand care options if your condition(s) worsen?: Yes-patient verbalized understanding  SDOH Interventions Today    Flowsheet Row Most Recent Value  SDOH Interventions   Food Insecurity Interventions Intervention Not Indicated  Housing Interventions Intervention Not Indicated  Transportation Interventions Intervention Not Indicated  Utilities Interventions Intervention Not Indicated  Financial Strain Interventions Intervention Not Indicated     Jodelle Gross, RN, BSN, CCM Care Management Coordinator [place]

## 2022-11-09 NOTE — Progress Notes (Addendum)
BP (!) 112/57   Pulse 60   Ht 5\' 3"  (1.6 m)   Wt 148 lb (67.1 kg)   SpO2 99%   BMI 26.22 kg/m    Subjective:   Patient ID: [name], female    DOB: 1929/03/02, 89 y.o.   MRN: [id number]  HPI: [name] is a 89 y.o. female presenting on 11/09/2022 for Congestive Heart Failure, Atrial Fibrillation, and Fatigue   HPI Transitional care and hospital follow-up office visit. Patient is coming in for transition of care and office visit for hospital follow-up.  Patient was contacted on 11/09/2022 by Jodelle Gross, RN for follow-up.  Patient was in the hospital and admitted on 11/02/2022 and discharged on 11/05/2022 from [facility].  She was admitted for acute on chronic congestive heart failure and fatigue and weakness.  He has a little more congestion today and her weight is up 1 pound from her baseline since day and they are Watching it.  She denies any swelling in her legs.  She is currently taking 1 furosemide daily but was instructed by cardiology that if it starts to creep up on the way to call them or call us.  She is still feeling fatigued and weak and tired from her anemia and we are going to recheck that today.  She says she did get 1 unit of blood.  She does not feel like she is losing blood anywhere.  Relevant past medical, surgical, family and social history reviewed and updated as indicated. Interim medical history since our last visit reviewed. Allergies and medications reviewed and updated.  Review of Systems  Constitutional:  Positive for fatigue. Negative for chills and fever.  HENT:  Negative for congestion, ear discharge and ear pain.   Eyes:  Negative for redness and visual disturbance.  Respiratory:  Negative for chest tightness and shortness of breath.   Cardiovascular:  Negative for chest pain and leg swelling.  Gastrointestinal:  Negative for blood in stool.  Genitourinary:  Negative for difficulty urinating, dysuria and hematuria.   Musculoskeletal:  Negative for back pain and gait problem.  Skin:  Negative for rash.  Neurological:  Positive for weakness. Negative for light-headedness and headaches.  Psychiatric/Behavioral:  Negative for agitation and behavioral problems.   All other systems reviewed and are negative.   Per HPI unless specifically indicated above   Allergies as of 11/09/2022       Reactions   Naproxen Hives, Itching, Rash, Other (See Comments)   Iodinated Contrast Media Hives, Itching, Rash, Other (See Comments)   03/13/14: symptoms began within two hours of intrathecal injection (myelogram 03/11/14); on shoulders radiating down to stomach.  Relief with Benadryl.  Told patient she would need to pre-med with Benadryl in the future before receiving this contrast.  Donell Sievert, RN        Medication List        Accurate as of Nov 09, 2022  5:01 PM. If you have any questions, ask your nurse or doctor.          acetaminophen 500 MG tablet Commonly known as: TYLENOL Take 1,000 mg by mouth every 6 (six) hours as needed for mild pain.   amiodarone 200 MG tablet Commonly known as: PACERONE TAKE ONE (1) TABLET EACH DAY What changed: See the new instructions.   apixaban 5 MG Tabs tablet Commonly known as: Eliquis Take 1 tablet (5 mg total) by mouth 2 (two) times daily.   Benefiber Powd Take 1 Dose  by mouth daily as needed (constipation).   calcium-vitamin D 500-200 MG-UNIT tablet Commonly known as: OSCAL WITH D Take 1 tablet by mouth daily with breakfast.   cyanocobalamin 1000 MCG/ML injection Commonly known as: VITAMIN B12 Inject 1 mL (1,000 mcg total) into the muscle every 30 (thirty) days.   ezetimibe 10 MG tablet Commonly known as: ZETIA Take 1 tablet (10 mg total) by mouth daily.   furosemide 40 MG tablet Commonly known as: LASIX Take 1 tablet (40 mg total) by mouth daily. May take 2 tablets (80mg ) daily as needed   HYDROcodone-acetaminophen 5-325 MG tablet Commonly known  as: NORCO/VICODIN Take 1 tablet by mouth every 6 (six) hours as needed for moderate pain.   latanoprost 0.005 % ophthalmic solution Commonly known as: XALATAN Place 1 drop into both eyes at bedtime.   megestrol 40 MG tablet Commonly known as: MEGACE Take 1 tablet (40 mg total) by mouth 2 (two) times daily. With breakfast and lunch What changed:  medication strength how much to take Changed by: Nils Pyle, MD   multivitamin tablet Take 1 tablet by mouth every morning.   nadolol 40 MG tablet Commonly known as: CORGARD Take 0.5 tablets (20 mg total) by mouth 2 (two) times daily.   nitroGLYCERIN 0.4 MG SL tablet Commonly known as: NITROSTAT Place 1 tablet (0.4 mg total) under the tongue every 5 (five) minutes as needed for chest pain. DISSOLVE 1 TAB UNDER TOUNGE FOR CHEST PAIN. MAY REPEAT EVERY 5 MINUTES FOR 3 DOSES. IF NO RELIEF CALL 911 OR GO TO ER Strength: 0.4 mg   pantoprazole 40 MG tablet Commonly known as: PROTONIX TAKE ONE (1) TABLET BY MOUTH EVERY DAY         Objective:   BP (!) 112/57   Pulse 60   Ht 5\' 3"  (1.6 m)   Wt 148 lb (67.1 kg)   SpO2 99%   BMI 26.22 kg/m   Wt Readings from Last 3 Encounters:  11/09/22 148 lb (67.1 kg)  11/07/22 146 lb 3.2 oz (66.3 kg)  11/05/22 147 lb 14.9 oz (67.1 kg)    Physical Exam Vitals and nursing note reviewed.  Constitutional:      General: She is not in acute distress.    Appearance: She is well-developed. She is not diaphoretic.  Eyes:     Conjunctiva/sclera: Conjunctivae normal.  Cardiovascular:     Rate and Rhythm: Normal rate. Rhythm irregular.     Heart sounds: Normal heart sounds. No murmur heard. Pulmonary:     Effort: Pulmonary effort is normal. No respiratory distress.     Breath sounds: Rales (Left basilar) present. No wheezing.  Musculoskeletal:        General: No tenderness. Normal range of motion.  Skin:    General: Skin is warm and dry.     Findings: No rash.  Neurological:     Mental  Status: She is alert and oriented to person, place, and time.     Coordination: Coordination normal.  Psychiatric:        Behavior: Behavior normal.     In  Assessment & Plan:   Problem List Items Addressed This Visit       Cardiovascular and Mediastinum   Persistent atrial fibrillation (HCC)   Relevant Orders   CBC with Differential/Platelet   CMP14+EGFR   Brain natriuretic peptide   Lipid panel   Chronic CHF (congestive heart failure) (HCC) - Primary   Relevant Orders   CBC with Differential/Platelet  CMP14+EGFR   Brain natriuretic peptide   Lipid panel     Other   Iron deficiency anemia (Chronic)   Relevant Orders   CBC with Differential/Platelet   CMP14+EGFR   Brain natriuretic peptide   Iron, TIBC and Ferritin Panel   Lipid panel   Instructed patient that they could take 1 extra dose of furosemide tomorrow morning if her weight is still up and since she is still feeling congested.  Will recheck iron panels with anemia panel and recheck a BNP today.  If blood counts are still down then we will have her go see a hematologist. Follow up plan: Return if symptoms worsen or fail to improve, for 1 to 45-month recheck anemia.  Counseling provided for all of the vaccine components Orders Placed This Encounter  Procedures   CBC with Differential/Platelet   CMP14+EGFR   Brain natriuretic peptide   Iron, TIBC and Ferritin Panel   Lipid panel    Arville Care, MD [facility] 11/09/2022, 5:01 PM

## 2022-11-09 NOTE — Telephone Encounter (Signed)
This encounter was created in error - please disregard.

## 2022-11-09 NOTE — Addendum Note (Signed)
Addended by: Arville Care on: 11/09/2022 05:01 PM   Modules accepted: Orders

## 2022-11-10 ENCOUNTER — Other Ambulatory Visit: Payer: Self-pay

## 2022-11-10 DIAGNOSIS — I10 Essential (primary) hypertension: Secondary | ICD-10-CM | POA: Diagnosis not present

## 2022-11-10 DIAGNOSIS — I4819 Other persistent atrial fibrillation: Secondary | ICD-10-CM | POA: Diagnosis not present

## 2022-11-10 DIAGNOSIS — S22088D Other fracture of T11-T12 vertebra, subsequent encounter for fracture with routine healing: Secondary | ICD-10-CM | POA: Diagnosis not present

## 2022-11-10 DIAGNOSIS — Z7901 Long term (current) use of anticoagulants: Secondary | ICD-10-CM | POA: Diagnosis not present

## 2022-11-10 DIAGNOSIS — E785 Hyperlipidemia, unspecified: Secondary | ICD-10-CM | POA: Diagnosis not present

## 2022-11-10 DIAGNOSIS — D509 Iron deficiency anemia, unspecified: Secondary | ICD-10-CM | POA: Diagnosis not present

## 2022-11-10 DIAGNOSIS — Z950 Presence of cardiac pacemaker: Secondary | ICD-10-CM | POA: Diagnosis not present

## 2022-11-10 DIAGNOSIS — Z9181 History of falling: Secondary | ICD-10-CM | POA: Diagnosis not present

## 2022-11-10 LAB — CBC WITH DIFFERENTIAL/PLATELET
Basos: 1 %
Eos: 4 %
Hemoglobin: 8.5 g/dL — CL (ref 11.1–15.9)
Immature Grans (Abs): 0 10*3/uL (ref 0.0–0.1)
Immature Granulocytes: 0 %
Lymphocytes Absolute: 1.3 10*3/uL (ref 0.7–3.1)
Lymphs: 31 %
MCH: 25 pg — ABNORMAL LOW (ref 26.6–33.0)
MCV: 83 fL (ref 79–97)
Neutrophils Absolute: 2.1 10*3/uL (ref 1.4–7.0)
Neutrophils: 48 %
Platelets: 170 10*3/uL (ref 150–450)
RDW: 17 % — ABNORMAL HIGH (ref 11.7–15.4)
WBC: 4.3 10*3/uL (ref 3.4–10.8)

## 2022-11-10 LAB — LIPID PANEL
Chol/HDL Ratio: 2.8 ratio (ref 0.0–4.4)
Cholesterol, Total: 110 mg/dL (ref 100–199)
HDL: 39 mg/dL — ABNORMAL LOW (ref >39)
Triglycerides: 82 mg/dL (ref 0–149)
VLDL Cholesterol Cal: 16 mg/dL (ref 5–40)

## 2022-11-10 LAB — CMP14+EGFR
ALT: 14 IU/L (ref 0–32)
Albumin: 4.3 g/dL (ref 3.6–4.6)
Alkaline Phosphatase: 71 IU/L (ref 44–121)
BUN/Creatinine Ratio: 23 (ref 12–28)
BUN: 21 mg/dL (ref 10–36)
Bilirubin Total: 0.5 mg/dL (ref 0.0–1.2)
Calcium: 9.6 mg/dL (ref 8.7–10.3)
Chloride: 101 mmol/L (ref 96–106)
Creatinine, Ser: 0.9 mg/dL (ref 0.57–1.00)
Globulin, Total: 2.1 g/dL (ref 1.5–4.5)
Glucose: 126 mg/dL — ABNORMAL HIGH (ref 70–99)
Potassium: 3.9 mmol/L (ref 3.5–5.2)

## 2022-11-10 LAB — IRON,TIBC AND FERRITIN PANEL
Ferritin: 42 ng/mL (ref 15–150)
Iron Saturation: 5 % — CL (ref 15–55)
Iron: 25 ug/dL — ABNORMAL LOW (ref 27–139)
Total Iron Binding Capacity: 462 ug/dL — ABNORMAL HIGH (ref 250–450)
UIBC: 437 ug/dL — ABNORMAL HIGH (ref 118–369)

## 2022-11-10 MED ORDER — IRON (FERROUS SULFATE) 325 (65 FE) MG PO TABS: 325.0000 mg | ORAL_TABLET | Freq: Every day | ORAL | 1 refills | Status: DC

## 2022-11-11 ENCOUNTER — Other Ambulatory Visit: Payer: Self-pay

## 2022-11-11 ENCOUNTER — Ambulatory Visit (HOSPITAL_COMMUNITY)
Admission: EM | Admit: 2022-11-11 | Discharge: 2022-11-11 | Disposition: A | Discharge status: Home / Self Care | Payer: Medicare Other | Attending: Physician Assistant | Admitting: Physician Assistant

## 2022-11-11 ENCOUNTER — Encounter (HOSPITAL_COMMUNITY): Payer: Self-pay | Admitting: Emergency Medicine

## 2022-11-11 DIAGNOSIS — R21 Rash and other nonspecific skin eruption: Secondary | ICD-10-CM | POA: Diagnosis not present

## 2022-11-11 MED ORDER — CICLOPIROX OLAMINE 0.77 % EX CREA: TOPICAL_CREAM | Freq: Two times a day (BID) | CUTANEOUS | 1 refills | Status: DC

## 2022-11-11 MED ORDER — NYSTATIN 100000 UNIT/GM EX POWD: 1.0000 | Freq: Three times a day (TID) | CUTANEOUS | 1 refills | Status: DC

## 2022-11-11 NOTE — ED Triage Notes (Signed)
Patient reports skin itching for 3 days.  A week ago, was a patient at [facility] and had slight itching.  Jeani Hawking gave patient powder while in-patient , but neglected to send it to the pharmacy.  Rash significantly worsened 2 days ago.  Benadryl is not working.  Hydrocortisone cream somewhat helped

## 2022-11-11 NOTE — ED Provider Notes (Signed)
[facility]    CSN: [id number] Arrival date & time: 11/11/22  1444      History   Chief Complaint Chief Complaint  Patient presents with   Rash    HPI [place] [name] is a 89 y.o. female.   Patient presents today companied by her son who help provide the majority of history.  Reports for the past 3 days she has had a recurrent pruritic rash.  Reports she has been struggling with this for the past several months and has tried multiple medications including some kind of powder.  This has provided temporary relief only to have recurrence.  She was recently hospitalized and has had the rash since returning home.  She has tried hydrocortisone and Benadryl cream without improvement of symptoms.  Denies any changes to personal hygiene products including soaps or detergents.  Denies any recent medication changes.  Denies history of dermatological condition.  She has tried oral Benadryl with minimal improvement of symptoms.  Symptoms are preventing her from sleeping at night.  She denies any swelling of her throat, shortness of breath.  Denies any exposure to plants or insects.    Past Medical History:  Diagnosis Date   Arthritis    Atrial flutter (HCC)    s/p RFA 2003   Breast cancer (HCC)    1980's - lumpectomy only   Colon polyps    Diverticulosis    DVT, lower extremity (HCC) 1998 and 1999   Bilateral   Essential hypertension    Gallstones    GERD (gastroesophageal reflux disease)    Glaucoma    IBS (irritable bowel syndrome)    Mixed hyperlipidemia    Osteoporosis    Persistent atrial fibrillation (HCC)    Pneumonia 2011   Sick sinus syndrome (HCC)    PPM (SJM)   Urinary tract infection    Varicose veins     Patient Active Problem List   Diagnosis Date Noted   Acute on chronic combined systolic and diastolic CHF (congestive heart failure) (HCC) 11/03/2022   Acute on chronic diastolic CHF (congestive heart failure) (HCC) 11/02/2022   Prolonged QT interval  11/24/2021   Gastric nodule    Chronic CHF (congestive heart failure) (HCC) 07/02/2021   Iron deficiency anemia 07/02/2021   History of DVT (deep vein thrombosis) 07/02/2021   Pulmonary HTN /echo 11/18/2020 11/18/2020   Glaucoma    Osteoporosis 11/25/2018   B12 deficiency 11/25/2018   Coronary artery disease due to lipid rich plaque 01/27/2016   Persistent atrial fibrillation (HCC) 09/01/2015   Chronic anticoagulation 09/01/2015   History of total knee arthroplasty 02/10/2015   Sick sinus syndrome (HCC) 03/22/2011   Hyperlipidemia 02/02/2009   Essential hypertension, benign 02/02/2009   COLONIC POLYPS, ADENOMATOUS, HX OF 05/15/2008    Past Surgical History:  Procedure Laterality Date   BIOPSY  07/04/2021   Procedure: BIOPSY;  Surgeon: Tressia Danas, MD;  Location: [facility] ENDOSCOPY;  Service: Gastroenterology;;   BREAST LUMPECTOMY  1980's   Right breast    CARDIAC CATHETERIZATION  1999   Normal coronaries   Cataracts Bilateral    CHOLECYSTECTOMY     COLONOSCOPY W/ BIOPSIES AND POLYPECTOMY  03/2003, 05/2008, 02/20/2011   severe diverticulosis, tubulovillous adenoma polyp, internal and external hemorrhoids 2012: severe diverticulosis, 4 mm polyp, hemorrhoids   CYST REMOVAL TRUNK     under breast = on belly    DILATION AND CURETTAGE OF UTERUS     ESOPHAGOGASTRODUODENOSCOPY (EGD) WITH PROPOFOL N/A 07/04/2021   Procedure:  ESOPHAGOGASTRODUODENOSCOPY (EGD) WITH PROPOFOL;  Surgeon: Tressia Danas, MD;  Location: [facility] ENDOSCOPY;  Service: Gastroenterology;  Laterality: N/A;   EYE SURGERY     cataracts - bilateral    Fractured wrist Left 2013   repair   INSERT / REPLACE / REMOVE PACEMAKER     KNEE ARTHROSCOPY WITH MEDIAL MENISECTOMY Right 10/21/2014   Procedure: RIGHT KNEE ARTHROSCOPY WITH MEDIAL MENISECTOM,lateral menisectomy,synovectomy suprapatellar pouch;  Surgeon: Ranee Gosselin, MD;  Location: [facility] ORS;  Service: Orthopedics;  Laterality: Right;   PACEMAKER GENERATOR CHANGE  06/18/12    [facility] DR RF, Dr Johney Frame   PACEMAKER INSERTION  2003   PERMANENT PACEMAKER GENERATOR CHANGE N/A 06/18/2012   Procedure: PERMANENT PACEMAKER GENERATOR CHANGE;  Surgeon: Hillis Range, MD;  Location: [facility] CATH LAB;  Service: Cardiovascular;  Laterality: N/A;   PPM GENERATOR CHANGEOUT N/A 09/30/2021   Procedure: PPM GENERATOR CHANGEOUT;  Surgeon: Hillis Range, MD;  Location: [facility] INVAS[facility] CV LAB;  Service: Cardiovascular;  Laterality: N/A;   TOTAL KNEE ARTHROPLASTY  12/20/2011   Procedure: TOTAL KNEE ARTHROPLASTY;  Surgeon: Jacki Cones, MD;  Location: [facility] ORS;  Service: Orthopedics;  Laterality: Left;   TOTAL KNEE ARTHROPLASTY Right 02/10/2015   Procedure: TOTAL RIGHT KNEE ARTHROPLASTY;  Surgeon: Ranee Gosselin, MD;  Location: [facility] ORS;  Service: Orthopedics;  Laterality: Right;    OB History   No obstetric history on file.      Home Medications    Prior to Admission medications   Medication Sig Start Date End Date Taking? Authorizing Provider  ciclopirox (LOPROX) 0.77 % cream Apply topically 2 (two) times daily. 11/11/22  Yes Zeniah Briney K, PA-C  Iron, Ferrous Sulfate, 325 (65 Fe) MG TABS Take 325 mg by mouth daily. 11/10/22   Dettinger, Elige Radon, MD  nystatin (MYCOSTATIN/NYSTOP) powder Apply 1 Application topically 3 (three) times daily. 11/11/22  Yes Melissa Pulido, Noberto Retort, PA-C  acetaminophen (TYLENOL) 500 MG tablet Take 1,000 mg by mouth every 6 (six) hours as needed for mild pain.    [provider]  amiodarone (PACERONE) 200 MG tablet TAKE ONE (1) TABLET EACH DAY Patient taking differently: Take 200 mg by mouth daily. 07/31/22   Dettinger, Elige Radon, MD  apixaban (ELIQUIS) 5 MG TABS tablet Take 1 tablet (5 mg total) by mouth 2 (two) times daily. 10/04/22   Dettinger, Elige Radon, MD  calcium-vitamin D (OSCAL WITH D) 500-200 MG-UNIT tablet Take 1 tablet by mouth daily with breakfast.     [provider]  cyanocobalamin (VITAMIN B12) 1000 MCG/ML injection Inject 1 mL (1,000 mcg total)  into the muscle every 30 (thirty) days. 04/10/22   Dettinger, Elige Radon, MD  ezetimibe (ZETIA) 10 MG tablet Take 1 tablet (10 mg total) by mouth daily. 08/08/22   Jonelle Sidle, MD  furosemide (LASIX) 40 MG tablet Take 1 tablet (40 mg total) by mouth daily. May take 2 tablets (80mg ) daily as needed 11/01/22   Antoine Poche, MD  HYDROcodone-acetaminophen (NORCO/VICODIN) 5-325 MG tablet Take 1 tablet by mouth every 6 (six) hours as needed for moderate pain. 10/18/22   [provider]  latanoprost (XALATAN) 0.005 % ophthalmic solution Place 1 drop into both eyes at bedtime.    [provider]  megestrol (MEGACE) 40 MG tablet Take 1 tablet (40 mg total) by mouth 2 (two) times daily. With breakfast and lunch 11/09/22   Dettinger, Elige Radon, MD  Multiple Vitamin (MULTIVITAMIN) tablet Take 1 tablet by mouth every morning.    [provider]  nadolol (CORGARD) 40 MG tablet Take 0.5 tablets (20 mg total) by mouth 2 (two) times daily. 11/05/22   Shahmehdi, Gemma Payor, MD  nitroGLYCERIN (NITROSTAT) 0.4 MG SL tablet Place 1 tablet (0.4 mg total) under the tongue every 5 (five) minutes as needed for chest pain. DISSOLVE 1 TAB UNDER TOUNGE FOR CHEST PAIN. MAY REPEAT EVERY 5 MINUTES FOR 3 DOSES. IF NO RELIEF CALL 911 OR GO TO ER Strength: 0.4 mg 11/07/22   Sharlene Dory, NP  pantoprazole (PROTONIX) 40 MG tablet TAKE ONE (1) TABLET BY MOUTH EVERY DAY 07/31/22   Dettinger, Elige Radon, MD  Wheat Dextrin (BENEFIBER) POWD Take 1 Dose by mouth daily as needed (constipation).    [provider]    Family History Family History  Problem Relation Age of Onset   Throat cancer Father    Stroke Mother    Alzheimer's disease Mother    Alzheimer's disease Brother    Alzheimer's disease Sister    Colon cancer Maternal Grandfather    Stomach cancer Maternal Grandmother    Heart disease Paternal Grandmother    Heart attack Paternal Grandmother    Prostate cancer Son    Colon cancer Sister     Colon cancer Brother    Heart disease Brother    Atrial fibrillation Brother    Diabetes Brother    Dementia Brother    Parkinson's disease Brother    Prostate cancer Son     Social History Social History   Tobacco Use   Smoking status: Never   Smokeless tobacco: Never  Vaping Use   Vaping Use: Never used  Substance Use Topics   Alcohol use: No    Alcohol/week: 0.0 standard drinks of alcohol   Drug use: No     Allergies   Naproxen and Iodinated contrast media   Review of Systems Review of Systems  Constitutional:  Negative for activity change, appetite change, fatigue and fever.  Respiratory:  Negative for cough and shortness of breath.   Cardiovascular:  Negative for chest pain.  Gastrointestinal:  Negative for abdominal pain, diarrhea, nausea and vomiting.  Musculoskeletal:  Negative for arthralgias and myalgias.  Skin:  Positive for rash.     Physical Exam Triage Vital Signs ED Triage Vitals  Enc Vitals Group     BP 11/11/22 1647 115/75     Pulse Rate 11/11/22 1647 83     Resp 11/11/22 1647 20     Temp 11/11/22 1647 97.9 F (36.6 C)     Temp Source 11/11/22 1647 Oral     SpO2 11/11/22 1647 94 %     Weight --      Height --      Head Circumference --      Peak Flow --      Pain Score 11/11/22 1643 0     Pain Loc --      Pain Edu? --      Excl. in GC? --    No data found.  Updated Vital Signs BP 115/75 (BP Location: Right Arm)   Pulse 83   Temp 97.9 F (36.6 C) (Oral)   Resp 20   SpO2 94%   Visual Acuity Right Eye Distance:   Left Eye Distance:   Bilateral Distance:    Right Eye Near:   Left Eye Near:    Bilateral Near:     Physical Exam Vitals reviewed.  Constitutional:      General: She is awake. She is  not in acute distress.    Appearance: Normal appearance. She is well-developed. She is not ill-appearing.     Comments: Very pleasant female appears stated age sitting comfortably in her wheelchair in no acute distress  HENT:      Head: Normocephalic and atraumatic.  Cardiovascular:     Rate and Rhythm: Normal rate and regular rhythm.     Heart sounds: S1 normal and S2 normal. Murmur heard.  Pulmonary:     Effort: Pulmonary effort is normal.     Breath sounds: Examination of the right-lower field reveals rales. Examination of the left-lower field reveals rales. Rales present. No wheezing or rhonchi.     Comments: Trace rales bilaterally Abdominal:     General: Bowel sounds are normal.     Palpations: Abdomen is soft.     Tenderness: There is no abdominal tenderness. There is no right CVA tenderness, left CVA tenderness, guarding or rebound.  Skin:    Findings: Rash present. Rash is macular and papular.     Comments: Maculopapular rash noted to trunk including anterior abdomen and back with evidence of excoriation.  Inframammary folds have erythematous lesions with scattered papules that coalesce into rash on abdomen.  Psychiatric:        Behavior: Behavior is cooperative.      UC Treatments / Results  Labs (all labs ordered are listed, but only abnormal results are displayed) Labs Reviewed - No data to display  EKG   Radiology No results found.  Procedures Procedures (including critical care time)  Medications Ordered in UC Medications - No data to display  Initial Impression / Assessment and Plan / UC Course  I have reviewed the triage vital signs and the nursing notes.  Pertinent labs & imaging results that were available during my care of the patient were reviewed by me and considered in my medical decision making (see chart for details).     Patient is well-appearing, afebrile, nontoxic, nontachycardic.  Discussed that the lesions under her breasts are consistent with tinea corporis and so was prescribed Loprox cream as well as nystatin powder to help manage this.  Discussed that the distribution onto her abdomen and back is unusual for this condition but she can use the powder on these other  areas.  We discussed that her symptoms could have an allergic component and so she can continue Benadryl as previously prescribed but discussed that she should limit use due to concern for anticholinergic effects and her age.  She is to discontinue the cortisone as this has been ineffective.  Recommended hypoallergenic soaps and detergents as well as fragrance and dye free moisturizer.  She is to wear loosefitting cotton clothing.  Recommend close follow-up with her primary care.  If she has any worsening symptoms including swelling of her throat, shortness of breath, fever, worsening rash, spread of rash she needs to be seen immediately.  Strict return precautions given.  Final Clinical Impressions(s) / UC Diagnoses   Final diagnoses:  Rash and nonspecific skin eruption     Discharge Instructions      As we discussed, I am not exactly sure what is causing your symptoms.  It does appear that the area under your breasts is more fungal.  Please apply Loprox twice daily and use nystatin powder 3 times daily.  Use hypoallergenic soaps and detergents and wear loosefitting cotton clothing.  You can use Benadryl as previously prescribed for itching.  If your symptoms or not improving quickly please follow-up with  your primary care.  If anything worsens and you have widespread rash, swelling of your throat, shortness of breath, fever you need to be seen immediately.     ED Prescriptions     Medication Sig Dispense Auth. Provider   nystatin (MYCOSTATIN/NYSTOP) powder Apply 1 Application topically 3 (three) times daily. 60 g Reginia Battie K, PA-C   ciclopirox (LOPROX) 0.77 % cream Apply topically 2 (two) times daily. 90 g Lynnea Vandervoort, Noberto Retort, PA-C      PDMP not reviewed this encounter.   Jeani Hawking, PA-C 11/11/22 1712

## 2022-11-11 NOTE — ED Notes (Signed)
Reviewed instructions with patient and caregiver (family)

## 2022-11-11 NOTE — Discharge Instructions (Signed)
As we discussed, I am not exactly sure what is causing your symptoms.  It does appear that the area under your breasts is more fungal.  Please apply Loprox twice daily and use nystatin powder 3 times daily.  Use hypoallergenic soaps and detergents and wear loosefitting cotton clothing.  You can use Benadryl as previously prescribed for itching.  If your symptoms or not improving quickly please follow-up with your primary care.  If anything worsens and you have widespread rash, swelling of your throat, shortness of breath, fever you need to be seen immediately.

## 2022-11-13 ENCOUNTER — Telehealth: Payer: Self-pay | Admitting: Cardiology

## 2022-11-13 DIAGNOSIS — I083 Combined rheumatic disorders of mitral, aortic and tricuspid valves: Secondary | ICD-10-CM | POA: Diagnosis not present

## 2022-11-13 DIAGNOSIS — Z7901 Long term (current) use of anticoagulants: Secondary | ICD-10-CM | POA: Diagnosis not present

## 2022-11-13 DIAGNOSIS — Z792 Long term (current) use of antibiotics: Secondary | ICD-10-CM | POA: Diagnosis not present

## 2022-11-13 DIAGNOSIS — R54 Age-related physical debility: Secondary | ICD-10-CM | POA: Diagnosis not present

## 2022-11-13 DIAGNOSIS — R0600 Dyspnea, unspecified: Secondary | ICD-10-CM | POA: Diagnosis not present

## 2022-11-13 DIAGNOSIS — R0902 Hypoxemia: Secondary | ICD-10-CM | POA: Diagnosis not present

## 2022-11-13 DIAGNOSIS — I480 Paroxysmal atrial fibrillation: Secondary | ICD-10-CM | POA: Diagnosis not present

## 2022-11-13 DIAGNOSIS — Z9981 Dependence on supplemental oxygen: Secondary | ICD-10-CM | POA: Diagnosis not present

## 2022-11-13 DIAGNOSIS — K219 Gastro-esophageal reflux disease without esophagitis: Secondary | ICD-10-CM | POA: Diagnosis not present

## 2022-11-13 DIAGNOSIS — Z79899 Other long term (current) drug therapy: Secondary | ICD-10-CM | POA: Diagnosis not present

## 2022-11-13 DIAGNOSIS — K589 Irritable bowel syndrome without diarrhea: Secondary | ICD-10-CM | POA: Diagnosis not present

## 2022-11-13 DIAGNOSIS — E785 Hyperlipidemia, unspecified: Secondary | ICD-10-CM | POA: Diagnosis not present

## 2022-11-13 DIAGNOSIS — Z743 Need for continuous supervision: Secondary | ICD-10-CM | POA: Diagnosis not present

## 2022-11-13 DIAGNOSIS — Z86718 Personal history of other venous thrombosis and embolism: Secondary | ICD-10-CM | POA: Diagnosis not present

## 2022-11-13 DIAGNOSIS — I509 Heart failure, unspecified: Secondary | ICD-10-CM | POA: Diagnosis not present

## 2022-11-13 DIAGNOSIS — R609 Edema, unspecified: Secondary | ICD-10-CM | POA: Diagnosis not present

## 2022-11-13 DIAGNOSIS — I110 Hypertensive heart disease with heart failure: Secondary | ICD-10-CM | POA: Diagnosis not present

## 2022-11-13 DIAGNOSIS — I272 Pulmonary hypertension, unspecified: Secondary | ICD-10-CM | POA: Diagnosis not present

## 2022-11-13 DIAGNOSIS — R52 Pain, unspecified: Secondary | ICD-10-CM | POA: Diagnosis not present

## 2022-11-13 DIAGNOSIS — I2510 Atherosclerotic heart disease of native coronary artery without angina pectoris: Secondary | ICD-10-CM | POA: Diagnosis not present

## 2022-11-13 DIAGNOSIS — I5043 Acute on chronic combined systolic (congestive) and diastolic (congestive) heart failure: Secondary | ICD-10-CM | POA: Diagnosis not present

## 2022-11-13 DIAGNOSIS — I495 Sick sinus syndrome: Secondary | ICD-10-CM | POA: Diagnosis not present

## 2022-11-13 DIAGNOSIS — I5033 Acute on chronic diastolic (congestive) heart failure: Secondary | ICD-10-CM | POA: Diagnosis not present

## 2022-11-13 DIAGNOSIS — R069 Unspecified abnormalities of breathing: Secondary | ICD-10-CM | POA: Diagnosis not present

## 2022-11-13 DIAGNOSIS — Z950 Presence of cardiac pacemaker: Secondary | ICD-10-CM | POA: Diagnosis not present

## 2022-11-13 NOTE — Telephone Encounter (Signed)
Pt c/o swelling: STAT is pt has developed SOB within 24 hours  If swelling, where is the swelling located? Chest, abdomen  How much weight have you gained and in what time span? 4 lbs  Have you gained 3 pounds in a day or 5 pounds in a week? Yes   Do you have a log of your daily weights (if so, list)? Yes   Are you currently taking a fluid pill? Yes   Are you currently SOB? Yes   Have you traveled recently? No

## 2022-11-13 NOTE — Telephone Encounter (Signed)
Spoke with son Ray. Reports baseline weight is around 145 lbs. Reports weight today 151.5 lbs, Thursday 146.5 lbs, Friday 148 lbs, Saturday & Sunday was 149.5 lbs. BP today 98/66 & 93/58 & HR 90. Does not check pulse ox to check O2 Sat. Son reports patient seemed to have SOB while sleeping in recliner last night and did not sleep well. Patient reported to son this morning that her breathing was getting worse. Denies swelling in legs or feet, chest pain or dizziness. Son reports that patient does not eat or drink well. Medications reviewed. Reports taking 80 mg furosemide yesterday and 40 mg an hour ago. Reports will take an additional 40 mg of furosemide now. Advised son for worsening SOB and increased weight gain, patient should go to ED for an evaluation. Son verbalized understanding of plan.

## 2022-11-16 ENCOUNTER — Ambulatory Visit (INDEPENDENT_AMBULATORY_CARE_PROVIDER_SITE_OTHER): Payer: Medicare Other

## 2022-11-16 DIAGNOSIS — I4819 Other persistent atrial fibrillation: Secondary | ICD-10-CM | POA: Diagnosis not present

## 2022-11-16 DIAGNOSIS — E785 Hyperlipidemia, unspecified: Secondary | ICD-10-CM | POA: Diagnosis not present

## 2022-11-16 DIAGNOSIS — D509 Iron deficiency anemia, unspecified: Secondary | ICD-10-CM

## 2022-11-16 DIAGNOSIS — I10 Essential (primary) hypertension: Secondary | ICD-10-CM | POA: Diagnosis not present

## 2022-11-16 DIAGNOSIS — S22088D Other fracture of T11-T12 vertebra, subsequent encounter for fracture with routine healing: Secondary | ICD-10-CM

## 2022-11-17 ENCOUNTER — Encounter: Payer: Self-pay | Admitting: *Deleted

## 2022-11-18 DIAGNOSIS — Z9181 History of falling: Secondary | ICD-10-CM | POA: Diagnosis not present

## 2022-11-18 DIAGNOSIS — I4819 Other persistent atrial fibrillation: Secondary | ICD-10-CM | POA: Diagnosis not present

## 2022-11-18 DIAGNOSIS — S22088D Other fracture of T11-T12 vertebra, subsequent encounter for fracture with routine healing: Secondary | ICD-10-CM | POA: Diagnosis not present

## 2022-11-18 DIAGNOSIS — E785 Hyperlipidemia, unspecified: Secondary | ICD-10-CM | POA: Diagnosis not present

## 2022-11-18 DIAGNOSIS — Z950 Presence of cardiac pacemaker: Secondary | ICD-10-CM | POA: Diagnosis not present

## 2022-11-18 DIAGNOSIS — I10 Essential (primary) hypertension: Secondary | ICD-10-CM | POA: Diagnosis not present

## 2022-11-18 DIAGNOSIS — Z7901 Long term (current) use of anticoagulants: Secondary | ICD-10-CM | POA: Diagnosis not present

## 2022-11-18 DIAGNOSIS — D509 Iron deficiency anemia, unspecified: Secondary | ICD-10-CM | POA: Diagnosis not present

## 2022-11-20 ENCOUNTER — Telehealth: Payer: Self-pay | Admitting: Cardiology

## 2022-11-20 ENCOUNTER — Encounter: Payer: Self-pay | Admitting: Cardiology

## 2022-11-20 ENCOUNTER — Ambulatory Visit: Payer: Medicare Other | Attending: Cardiology | Admitting: Cardiology

## 2022-11-20 ENCOUNTER — Telehealth: Payer: Self-pay | Admitting: Family Medicine

## 2022-11-20 ENCOUNTER — Telehealth: Payer: Self-pay | Admitting: *Deleted

## 2022-11-20 VITALS — BP 118/70 | HR 59 | Ht 63.0 in | Wt 149.0 lb

## 2022-11-20 DIAGNOSIS — I5032 Chronic diastolic (congestive) heart failure: Secondary | ICD-10-CM

## 2022-11-20 DIAGNOSIS — Z950 Presence of cardiac pacemaker: Secondary | ICD-10-CM

## 2022-11-20 DIAGNOSIS — I480 Paroxysmal atrial fibrillation: Secondary | ICD-10-CM

## 2022-11-20 DIAGNOSIS — I495 Sick sinus syndrome: Secondary | ICD-10-CM | POA: Diagnosis not present

## 2022-11-20 DIAGNOSIS — Z79899 Other long term (current) drug therapy: Secondary | ICD-10-CM

## 2022-11-20 MED ORDER — SPIRONOLACTONE 25 MG PO TABS: 12.5000 mg | ORAL_TABLET | Freq: Every day | ORAL | 1 refills | Status: DC

## 2022-11-20 MED ORDER — FUROSEMIDE 40 MG PO TABS: 60.0000 mg | ORAL_TABLET | Freq: Every day | ORAL | 1 refills | Status: DC

## 2022-11-20 NOTE — Telephone Encounter (Signed)
Which meds do you want her to have?  Omeprazole and pepcid?  If so what strength?

## 2022-11-20 NOTE — Telephone Encounter (Signed)
Per Kenney Houseman RN w/[facility] says patient has been taking lasix 40 mg BID although [facility] D/C instructions says 20 mg BID. Advised that medications would be reconciled at visit today. Patient contacted, spoke with son Iantha Fallen and advised to bring medication bottles to visit today. Verbalized understanding.

## 2022-11-20 NOTE — Telephone Encounter (Signed)
Contacted [facility] [place] and was advised that no records of this patient being in their system for hospice or palliative care.

## 2022-11-20 NOTE — Progress Notes (Signed)
Cardiology Office Note  Date: 11/20/2022   ID: OFELIA [name], DOB [birth year], MRN [id number]  History of Present Illness: [name] is a 89 y.o. female last seen in May by [name] NP, I reviewed the note.  She was just recently hospitalized at [facility] with worsening shortness of breath and hypoxia.  She was treated for acute on chronic HFpEF and also suspected pneumonia.  Follow-up echocardiogram reported LVEF greater than 55% with mild LVH, mild mitral regurgitation, mild aortic stenosis, and mild to moderate tricuspid regurgitation with severely elevated RVSP of 66 mmHg.  She is here today with 2 of her sons for a follow-up visit.  Per discussion weight has been relatively stable, has fluctuated by only 1 pound while taking Lasix 80 mg daily since hospital discharge.  She had been on Lasix 40 mg with an extra 20 mg for fluid retention prior to her hospitalization.  I reviewed her recent lab work, creatinine 0.78 and potassium 3.9.  We have held off on addition of SGLT2 inhibitor in light of history of current urinary tract infections.  We did discuss trial of Aldactone today and modifying her standing Lasix regimen.  St. Jude pacemaker in place with follow-up with Dr. Nelly Laurence.  Device check in April revealed normal function.  Sons asked about possibility of ordering a hospital bed for home.  She has home health nursing and also palliative care which visits once a month.  She has limited mobility and sleeps much better if she is able to elevate her head/thorax.  Physical Exam: VS:  BP 118/70   Pulse (!) 59   Ht 5\' 3"  (1.6 m)   Wt 149 lb (67.6 kg)   SpO2 94%   BMI 26.39 kg/m , BMI Body mass index is 26.39 kg/m.  Wt Readings from Last 3 Encounters:  11/20/22 149 lb (67.6 kg)  11/09/22 148 lb (67.1 kg)  11/07/22 146 lb 3.2 oz (66.3 kg)    General: Patient appears comfortable at rest. HEENT: Conjunctiva and lids normal. Neck: Supple, no elevated JVP or carotid  bruits. Lungs: Clear to auscultation, nonlabored breathing at rest. Cardiac: Regular rate and rhythm, no S3, 2/6 systolic murmur. Extremities: Compression stockings in place with no pitting edema.  ECG:  An ECG dated June 2024 was personally reviewed today and demonstrated:  Dual-chamber paced rhythm.  Labwork: 09/15/2022: TSH 1.430 11/04/2022: Magnesium 2.2 11/09/2022: ALT 14; AST 17; BNP 725.1; BUN 21; Creatinine, Ser 0.90; Hemoglobin 8.5; Platelets 170; Potassium 3.9; Sodium 140     Component Value Date/Time   CHOL 110 11/09/2022 1701   TRIG 82 11/09/2022 1701   HDL 39 (L) 11/09/2022 1701   CHOLHDL 2.8 11/09/2022 1701   LDLCALC 55 11/09/2022 1701  June 2024: Hemoglobin 8.8, platelets 195, potassium 3.9, BUN 18, creatinine 0.78, AST 16, ALT 17, pro-BNP 2112, high-sensitivity troponin I normal  Other Studies Reviewed Today:  Echocardiogram 11/14/2022 [facility]): Summary   1. The left ventricle is normal in size with mildly increased wall  thickness.   2. The left ventricular systolic function is normal, LVEF is visually  estimated at > 55%.    3. There is mild mitral valve regurgitation.    4. There is mild aortic valve stenosis.    5. The left atrium is moderately dilated in size.    6. The right ventricle is mildly dilated in size, with normal systolic  function.   7. There is mild to moderate tricuspid regurgitation.  8. There is severe pulmonary hypertension.    9. The right atrium is moderately dilated in size.   Left Ventricle    The left ventricle is normal in size with mildly increased wall thickness.  The left ventricular systolic function is normal, LVEF is visually estimated  at > 55%. The left ventricular diastolic function is indeterminate.   Right Ventricle    The right ventricle is mildly dilated in size, with normal systolic  function.   Left Atrium    The left atrium is moderately dilated in size.   Right Atrium    The right atrium is moderately  dilated in size.   Aortic Valve    The aortic valve is trileaflet with moderately thickened leaflets with  mildly reduced excursion. There is trivial aortic regurgitation. There is mild  aortic valve stenosis. Peak AV transvalvular velocity:  2.0 m/s. Mean  gradient: 9 mmHg. Doppler velocity index: 0.63. Estimated aortic valve area  (VTI): 1.8 cm2. Estimated aortic valve area (velocity): 1.8 cm2. LVOT  diameter:  1.9 cm. LVOT stroke volume index: 43 ml/m2.   Mitral Valve    The mitral valve leaflets are mildly thickened with normal leaflet mobility.  Mitral annular calcification is present. There is mild mitral valve  regurgitation.   Tricuspid Valve    The tricuspid valve leaflets are normal, with normal leaflet mobility. There  is mild to moderate tricuspid regurgitation. There is severe pulmonary  hypertension. TR maximum velocity: 3.8 m/s  Estimated PASP: 66 mmHg.   Pulmonic Valve    The pulmonic valve is normal. There is mild pulmonic regurgitation. There is  no evidence of a significant transvalvular gradient.   Aorta   The aorta is normal in size in the visualized segments.   Inferior Vena Cava    IVC size and inspiratory change suggest mildly elevated right atrial  pressure. (5-10 mmHg).   Pericardium/Pleural   There is no pericardial effusion.   Assessment and Plan:  1.  HFpEF, LVEF greater than 55% by recent echocardiogram at [facility].  Also has evidence of severe pulmonary hypertension, likely WHO group 2/3.  Plan at this time is to initiate Aldactone 12.5 mg daily and change standing Lasix to 60 mg daily with an additional 20 mg as needed for weight gain of 2 to 3 pounds in 24 hours.  Recheck BMET in 10 days.  Holding off on SGLT2 inhibitor in light of history of recurrent UTIs.  Clinical visit scheduled for 3 to 4 weeks.  2.  Paroxysmal atrial fibrillation with CHA2DS2-VASc score of 5.  Plan to continue amiodarone for rhythm suppression.  She remains on  Eliquis for stroke prophylaxis.  3.  Sick sinus syndrome with St. Jude pacemaker in place and followed by Dr. Nelly Laurence.  Last device interrogation indicated normal function.  4.  Mixed hyperlipidemia.  She is on Zetia and Benefiber at this time.  5.  Asymptomatic carotid artery disease, mild by carotid Dopplers in December 2023.  Disposition:  Follow up  3 to 4 weeks.  Signed, Jonelle Sidle, M.D., F.A.C.C. [facility]

## 2022-11-20 NOTE — Patient Instructions (Addendum)
Medication Instructions:  Your physician has recommended you make the following change in your medication:  Increase furosemide to 60 mg daily. You may take an additional 20 mg daily as needed for weight gain of 2 lbs/day or 5 lbs/week Start spironolactone 12.5 mg daily Continue other medications the same  Labwork: BMET in 10 days around 11/30/2022 at [facility] Non-fasting  Testing/Procedures: none  Follow-Up: Your physician recommends that you schedule a follow-up appointment in: 3-4 weeks  Any Other Special Instructions Will Be Listed Below (If Applicable). We will contact you about getting a prescription for a hospital bed.   If you need a refill on your cardiac medications before your next appointment, please call your pharmacy.

## 2022-11-20 NOTE — Telephone Encounter (Signed)
[name] is following up. She wanted to inform RN that she made a mistake and the patient is a patient of their palliative care.

## 2022-11-20 NOTE — Telephone Encounter (Signed)
Please advise 

## 2022-11-20 NOTE — Telephone Encounter (Signed)
Spoke with Wynona Canes RN with [facility] who says they typically recommend this order come from the PCP and that she would reach out to the patient and PCP for this order.

## 2022-11-20 NOTE — Telephone Encounter (Signed)
Pt c/o medication issue:  1. Name of Medication: furosemide (LASIX) 40 MG tablet  2. How are you currently taking this medication (dosage and times per day)?   3. Are you having a reaction (difficulty breathing--STAT)? no  4. What is your medication issue? Calling to see how the patient is suppose to take her medication. States the medication has been change several times. Papers said one thing, but the family said the dr told them something different. Please advise

## 2022-11-20 NOTE — Telephone Encounter (Signed)
Yes double check that she has enough to get through that point but if she has enough of everything to get through 17 June then we will address all of this on the 17th.

## 2022-11-20 NOTE — Telephone Encounter (Signed)
Tonya calling to ask about what to tell pt at visit for 2 rx-- pantoprazole (PROTONIX) 40 MG tablet was d/c at [facility]. Pt was told to take omeprazole but no rx was written at discharge. Also, pt was told to take pepcid but no rx written  I explained to [place] that pt has hospital f/u 11/27/2022 and pcp here would probably address those rx then.

## 2022-11-21 ENCOUNTER — Other Ambulatory Visit: Payer: Self-pay | Admitting: Family Medicine

## 2022-11-21 DIAGNOSIS — K219 Gastro-esophageal reflux disease without esophagitis: Secondary | ICD-10-CM

## 2022-11-22 DIAGNOSIS — Z950 Presence of cardiac pacemaker: Secondary | ICD-10-CM | POA: Diagnosis not present

## 2022-11-22 DIAGNOSIS — D509 Iron deficiency anemia, unspecified: Secondary | ICD-10-CM | POA: Diagnosis not present

## 2022-11-22 DIAGNOSIS — I10 Essential (primary) hypertension: Secondary | ICD-10-CM | POA: Diagnosis not present

## 2022-11-22 DIAGNOSIS — Z9181 History of falling: Secondary | ICD-10-CM | POA: Diagnosis not present

## 2022-11-22 DIAGNOSIS — I4819 Other persistent atrial fibrillation: Secondary | ICD-10-CM | POA: Diagnosis not present

## 2022-11-22 DIAGNOSIS — Z7901 Long term (current) use of anticoagulants: Secondary | ICD-10-CM | POA: Diagnosis not present

## 2022-11-22 DIAGNOSIS — E785 Hyperlipidemia, unspecified: Secondary | ICD-10-CM | POA: Diagnosis not present

## 2022-11-22 DIAGNOSIS — S22088D Other fracture of T11-T12 vertebra, subsequent encounter for fracture with routine healing: Secondary | ICD-10-CM | POA: Diagnosis not present

## 2022-11-22 MED ORDER — FAMOTIDINE 20 MG PO TABS: 20.0000 mg | ORAL_TABLET | Freq: Two times a day (BID) | ORAL | 1 refills | Status: DC

## 2022-11-22 MED ORDER — OMEPRAZOLE 20 MG PO CPDR: 20.0000 mg | DELAYED_RELEASE_CAPSULE | Freq: Every day | ORAL | 1 refills | Status: DC

## 2022-11-22 NOTE — Telephone Encounter (Signed)
Give her omeprazole 20 mg daily and Pepcid 20 mg twice daily and given 1 month with 1 refill

## 2022-11-22 NOTE — Telephone Encounter (Signed)
Pt made aware

## 2022-11-22 NOTE — Addendum Note (Signed)
Addended by: Dorene Sorrow on: 11/22/2022 11:39 AM   Modules accepted: Orders

## 2022-11-23 DIAGNOSIS — S22088D Other fracture of T11-T12 vertebra, subsequent encounter for fracture with routine healing: Secondary | ICD-10-CM | POA: Diagnosis not present

## 2022-11-23 DIAGNOSIS — Z950 Presence of cardiac pacemaker: Secondary | ICD-10-CM | POA: Diagnosis not present

## 2022-11-23 DIAGNOSIS — Z7901 Long term (current) use of anticoagulants: Secondary | ICD-10-CM | POA: Diagnosis not present

## 2022-11-23 DIAGNOSIS — I4819 Other persistent atrial fibrillation: Secondary | ICD-10-CM | POA: Diagnosis not present

## 2022-11-23 DIAGNOSIS — D509 Iron deficiency anemia, unspecified: Secondary | ICD-10-CM | POA: Diagnosis not present

## 2022-11-23 DIAGNOSIS — I10 Essential (primary) hypertension: Secondary | ICD-10-CM | POA: Diagnosis not present

## 2022-11-23 DIAGNOSIS — E785 Hyperlipidemia, unspecified: Secondary | ICD-10-CM | POA: Diagnosis not present

## 2022-11-23 DIAGNOSIS — Z9181 History of falling: Secondary | ICD-10-CM | POA: Diagnosis not present

## 2022-11-27 ENCOUNTER — Encounter: Payer: Self-pay | Admitting: Family Medicine

## 2022-11-27 ENCOUNTER — Ambulatory Visit (INDEPENDENT_AMBULATORY_CARE_PROVIDER_SITE_OTHER): Payer: Medicare Other | Admitting: Family Medicine

## 2022-11-27 ENCOUNTER — Telehealth: Payer: Self-pay | Admitting: Family Medicine

## 2022-11-27 ENCOUNTER — Other Ambulatory Visit: Payer: Self-pay

## 2022-11-27 VITALS — BP 131/67 | HR 60 | Ht 63.0 in | Wt 148.0 lb

## 2022-11-27 DIAGNOSIS — J189 Pneumonia, unspecified organism: Secondary | ICD-10-CM

## 2022-11-27 DIAGNOSIS — R0902 Hypoxemia: Secondary | ICD-10-CM

## 2022-11-27 DIAGNOSIS — I5043 Acute on chronic combined systolic (congestive) and diastolic (congestive) heart failure: Secondary | ICD-10-CM

## 2022-11-27 DIAGNOSIS — D509 Iron deficiency anemia, unspecified: Secondary | ICD-10-CM

## 2022-11-27 MED ORDER — CICLOPIROX OLAMINE 0.77 % EX CREA: TOPICAL_CREAM | Freq: Two times a day (BID) | CUTANEOUS | 1 refills | Status: DC

## 2022-11-27 MED ORDER — AMOXICILLIN-POT CLAVULANATE 875-125 MG PO TABS
1.0000 | ORAL_TABLET | Freq: Two times a day (BID) | ORAL | 0 refills | Status: DC
Start: 2022-11-27 — End: 2022-12-12

## 2022-11-27 NOTE — Addendum Note (Signed)
Addended by: Arville Care on: 11/27/2022 11:22 AM   Modules accepted: Orders

## 2022-11-27 NOTE — Progress Notes (Addendum)
BP 131/67   Pulse 60   Ht 5\' 3"  (1.6 m)   Wt 148 lb (67.1 kg)   SpO2 98%   BMI 26.22 kg/m    Subjective:   Patient ID: [name], female    DOB: 24-May-1929, 89 y.o.   MRN: [id number]  HPI: [name] is a 89 y.o. female presenting on 11/27/2022 for Hospitalization Follow-up [facility] [place]), Edema, and URI   HPI In hospital from 6/3-6/5. Cough and congestion have returned some.  She finished antibiotic 5 days ago. She has a sore throat too.  cardiology added spironolactone.  Patient says that she is still having some cough and congestion but her breathing is a lot better.  Her edema is a lot better and her weights have been stable until this morning when it was actually down 4 pounds from what it had been so they did not know what to make of that.  She has been running about 148 since she left the hospital and then today it was 144-1/2 at home.  Her son has been keeping a close eye on this.  They are seeing [facility] for cardiology.  They do have a follow-up with me in a month as well.  She feels like she still has not completely cleared the infection with these other symptoms arising.  She denies any fevers or chills or shortness of breath.  Her legs been doing a lot better since the increase her medicine.  Relevant past medical, surgical, family and social history reviewed and updated as indicated. Interim medical history since our last visit reviewed. Allergies and medications reviewed and updated.  Review of Systems  Constitutional:  Negative for chills and fever.  HENT:  Positive for congestion and sore throat. Negative for ear discharge, ear pain, postnasal drip, rhinorrhea, sinus pressure and sneezing.   Eyes:  Negative for pain, redness and visual disturbance.  Respiratory:  Positive for cough. Negative for chest tightness, shortness of breath and wheezing.   Cardiovascular:  Negative for chest pain, palpitations and leg swelling.  Genitourinary:  Negative for  difficulty urinating and dysuria.  Musculoskeletal:  Negative for back pain and gait problem.  Skin:  Negative for rash.  Neurological:  Negative for light-headedness and headaches.  Psychiatric/Behavioral:  Negative for agitation and behavioral problems.   All other systems reviewed and are negative.   Per HPI unless specifically indicated above   Allergies as of 11/27/2022       Reactions   Naproxen Hives, Itching, Rash, Other (See Comments)   Iodinated Contrast Media Hives, Itching, Rash, Other (See Comments)   03/13/14: symptoms began within two hours of intrathecal injection (myelogram 03/11/14); on shoulders radiating down to stomach.  Relief with Benadryl.  Told patient she would need to pre-med with Benadryl in the future before receiving this contrast.  Donell Sievert, RN        Medication List        Accurate as of November 27, 2022 11:22 AM. If you have any questions, ask your nurse or doctor.          acetaminophen 500 MG tablet Commonly known as: TYLENOL Take 1,000 mg by mouth every 6 (six) hours as needed for mild pain.   amiodarone 200 MG tablet Commonly known as: PACERONE TAKE ONE (1) TABLET EACH DAY What changed: See the new instructions.   amoxicillin-clavulanate 875-125 MG tablet Commonly known as: AUGMENTIN Take 1 tablet by mouth 2 (two) times daily. Started by: Elige Radon  Kelcy Baeten, MD   apixaban 5 MG Tabs tablet Commonly known as: Eliquis Take 1 tablet (5 mg total) by mouth 2 (two) times daily.   Benefiber Powd Take 1 Dose by mouth daily as needed (constipation).   calcium-vitamin D 500-200 MG-UNIT tablet Commonly known as: OSCAL WITH D Take 1 tablet by mouth daily with breakfast.   ciclopirox 0.77 % cream Commonly known as: Loprox Apply topically 2 (two) times daily.   cyanocobalamin 1000 MCG/ML injection Commonly known as: VITAMIN B12 Inject 1 mL (1,000 mcg total) into the muscle every 30 (thirty) days.   ezetimibe 10 MG tablet Commonly  known as: ZETIA Take 1 tablet (10 mg total) by mouth daily.   famotidine 20 MG tablet Commonly known as: Pepcid Take 1 tablet (20 mg total) by mouth 2 (two) times daily.   furosemide 40 MG tablet Commonly known as: LASIX Take 1.5 tablets (60 mg total) by mouth daily. May take an extra 20 mg daily as needed for weight gain of 2 lbs/day or 5 lbs/week   HYDROcodone-acetaminophen 5-325 MG tablet Commonly known as: NORCO/VICODIN Take 1 tablet by mouth every 6 (six) hours as needed for moderate pain.   Iron (Ferrous Sulfate) 325 (65 Fe) MG Tabs Take 325 mg by mouth daily.   latanoprost 0.005 % ophthalmic solution Commonly known as: XALATAN Place 1 drop into both eyes at bedtime.   megestrol 40 MG tablet Commonly known as: MEGACE Take 1 tablet (40 mg total) by mouth 2 (two) times daily. With breakfast and lunch   multivitamin tablet Take 1 tablet by mouth every morning.   nadolol 40 MG tablet Commonly known as: CORGARD Take 0.5 tablets (20 mg total) by mouth 2 (two) times daily.   nitroGLYCERIN 0.4 MG SL tablet Commonly known as: NITROSTAT Place 1 tablet (0.4 mg total) under the tongue every 5 (five) minutes as needed for chest pain. DISSOLVE 1 TAB UNDER TOUNGE FOR CHEST PAIN. MAY REPEAT EVERY 5 MINUTES FOR 3 DOSES. IF NO RELIEF CALL 911 OR GO TO ER Strength: 0.4 mg   nystatin powder Commonly known as: MYCOSTATIN/NYSTOP Apply 1 Application topically 3 (three) times daily.   omeprazole 20 MG capsule Commonly known as: PRILOSEC Take 1 capsule (20 mg total) by mouth daily.   spironolactone 25 MG tablet Commonly known as: ALDACTONE Take 0.5 tablets (12.5 mg total) by mouth daily.               Durable Medical Equipment  (From admission, onward)           Start     Ordered   11/27/22 0000  For home use only DME Hospital bed       Comments: Hospital bed Diagnosis congestive heart failure  Question Answer Comment  Length of Need Lifetime   Head must be elevated  greater than: 30 degrees   Bed type Semi-electric      11/27/22 1122             Objective:   BP 131/67   Pulse 60   Ht 5\' 3"  (1.6 m)   Wt 148 lb (67.1 kg)   SpO2 98%   BMI 26.22 kg/m   Wt Readings from Last 3 Encounters:  11/27/22 148 lb (67.1 kg)  11/20/22 149 lb (67.6 kg)  11/09/22 148 lb (67.1 kg)    Physical Exam Vitals and nursing note reviewed.  Constitutional:      General: She is not in acute distress.    Appearance: She  is well-developed. She is not diaphoretic.  Eyes:     Conjunctiva/sclera: Conjunctivae normal.  Cardiovascular:     Rate and Rhythm: Normal rate and regular rhythm.     Heart sounds: Normal heart sounds. No murmur heard. Pulmonary:     Effort: Pulmonary effort is normal. No respiratory distress.     Breath sounds: Normal breath sounds. No wheezing.  Musculoskeletal:        General: No swelling or tenderness. Normal range of motion.  Skin:    General: Skin is warm and dry.     Findings: No rash.  Neurological:     Mental Status: She is alert and oriented to person, place, and time.     Coordination: Coordination normal.  Psychiatric:        Behavior: Behavior normal.       Assessment & Plan:   Problem List Items Addressed This Visit       Cardiovascular and Mediastinum   Acute on chronic combined systolic and diastolic CHF (congestive heart failure) (HCC) - Primary   Relevant Orders   For home use only DME Hospital bed   Other Visit Diagnoses     Hypoxia       Relevant Orders   For home use only DME Hospital bed   Pneumonia of right lower lobe due to infectious organism       Relevant Medications   amoxicillin-clavulanate (AUGMENTIN) 875-125 MG tablet   ciclopirox (LOPROX) 0.77 % cream   Other Relevant Orders   For home use only DME Hospital bed       Continue to follow with cardiology and continue to do daily weights.  Will recheck her anemia and her kidney function and electrolytes today.  She was started on  spironolactone a couple weeks ago by cardiology.  With her congestive heart failure and shortness of breath they want a hospital bed where they can be able to elevate the head of the bed at least 30 degrees.  She is also less mobile so we will help her get in and out of the bed to have a be able to raise and lower.  Prescription refill for amoxicillin clavulanic acid. Follow up plan: Return if symptoms worsen or fail to improve.  Counseling provided for all of the vaccine components Orders Placed This Encounter  Procedures   For home use only DME Hospital bed    Arville Care, MD [facility] 11/27/2022, 11:22 AM

## 2022-11-28 ENCOUNTER — Telehealth: Payer: Self-pay | Admitting: Cardiology

## 2022-11-28 ENCOUNTER — Telehealth: Payer: Self-pay | Admitting: Family Medicine

## 2022-11-28 LAB — IRON,TIBC AND FERRITIN PANEL
Ferritin: 128 ng/mL (ref 15–150)
Iron Saturation: 55 % (ref 15–55)
Iron: 237 ug/dL — ABNORMAL HIGH (ref 27–139)
Total Iron Binding Capacity: 428 ug/dL (ref 250–450)
UIBC: 191 ug/dL (ref 118–369)

## 2022-11-28 NOTE — Telephone Encounter (Signed)
Pt's son was calling to check on the status of FMLA paperwork for him that was faxed over to the office on 11/22/2022. He stated he stays with pt one week month but he hadnt heard anything regarding FMLA paperwork. He's requesting a callback at [phone]. Please advise

## 2022-11-29 NOTE — Telephone Encounter (Signed)
LMOVM order & OV notes to [employer] to [phone], # pt gave is their phone #

## 2022-11-30 DIAGNOSIS — S22088D Other fracture of T11-T12 vertebra, subsequent encounter for fracture with routine healing: Secondary | ICD-10-CM | POA: Diagnosis not present

## 2022-11-30 DIAGNOSIS — E785 Hyperlipidemia, unspecified: Secondary | ICD-10-CM | POA: Diagnosis not present

## 2022-11-30 DIAGNOSIS — Z950 Presence of cardiac pacemaker: Secondary | ICD-10-CM | POA: Diagnosis not present

## 2022-11-30 DIAGNOSIS — Z9181 History of falling: Secondary | ICD-10-CM | POA: Diagnosis not present

## 2022-11-30 DIAGNOSIS — Z7901 Long term (current) use of anticoagulants: Secondary | ICD-10-CM | POA: Diagnosis not present

## 2022-11-30 DIAGNOSIS — D509 Iron deficiency anemia, unspecified: Secondary | ICD-10-CM | POA: Diagnosis not present

## 2022-11-30 DIAGNOSIS — I4819 Other persistent atrial fibrillation: Secondary | ICD-10-CM | POA: Diagnosis not present

## 2022-11-30 DIAGNOSIS — I10 Essential (primary) hypertension: Secondary | ICD-10-CM | POA: Diagnosis not present

## 2022-11-30 NOTE — Telephone Encounter (Signed)
Patient's son is following up due to not hearing back regarding FMLA paperwork. He states the deadline for the paperwork is 6/25 and he would like a call back ASAP to discuss whether it has been received.

## 2022-11-30 NOTE — Telephone Encounter (Signed)
Spoke to V. Slaughter that stated pt needs to fill out release paper and pay fee for [facility] paperwork. Message has been routed to her.

## 2022-11-30 NOTE — Telephone Encounter (Signed)
Spoke with Jocelyn Lamer (son) regarding the FMLA paper work that we received by fax. Mr. Betke lives in [place]. I explained to him that we did need a medical release form signed and we do have to charge $29.00. He wanted to pay over the phone with a credit card. I explained that I could not take the payment over the phone.Mr. Patti was told that we would see what we could do about getting the forms completed by Dr. Diona Browner.

## 2022-12-01 ENCOUNTER — Other Ambulatory Visit: Payer: Self-pay | Admitting: Family Medicine

## 2022-12-01 DIAGNOSIS — I480 Paroxysmal atrial fibrillation: Secondary | ICD-10-CM

## 2022-12-01 DIAGNOSIS — I5043 Acute on chronic combined systolic (congestive) and diastolic (congestive) heart failure: Secondary | ICD-10-CM | POA: Diagnosis not present

## 2022-12-01 DIAGNOSIS — R0902 Hypoxemia: Secondary | ICD-10-CM | POA: Diagnosis not present

## 2022-12-01 DIAGNOSIS — I10 Essential (primary) hypertension: Secondary | ICD-10-CM

## 2022-12-04 DIAGNOSIS — R42 Dizziness and giddiness: Secondary | ICD-10-CM | POA: Diagnosis not present

## 2022-12-04 DIAGNOSIS — R5383 Other fatigue: Secondary | ICD-10-CM | POA: Diagnosis not present

## 2022-12-04 DIAGNOSIS — I5042 Chronic combined systolic (congestive) and diastolic (congestive) heart failure: Secondary | ICD-10-CM | POA: Diagnosis not present

## 2022-12-04 DIAGNOSIS — R0601 Orthopnea: Secondary | ICD-10-CM | POA: Diagnosis not present

## 2022-12-04 DIAGNOSIS — B372 Candidiasis of skin and nail: Secondary | ICD-10-CM | POA: Diagnosis not present

## 2022-12-04 DIAGNOSIS — R0609 Other forms of dyspnea: Secondary | ICD-10-CM | POA: Diagnosis not present

## 2022-12-04 DIAGNOSIS — I272 Pulmonary hypertension, unspecified: Secondary | ICD-10-CM | POA: Diagnosis not present

## 2022-12-05 DIAGNOSIS — D509 Iron deficiency anemia, unspecified: Secondary | ICD-10-CM | POA: Diagnosis not present

## 2022-12-05 DIAGNOSIS — I10 Essential (primary) hypertension: Secondary | ICD-10-CM | POA: Diagnosis not present

## 2022-12-05 DIAGNOSIS — I4819 Other persistent atrial fibrillation: Secondary | ICD-10-CM | POA: Diagnosis not present

## 2022-12-05 DIAGNOSIS — Z950 Presence of cardiac pacemaker: Secondary | ICD-10-CM | POA: Diagnosis not present

## 2022-12-05 DIAGNOSIS — E785 Hyperlipidemia, unspecified: Secondary | ICD-10-CM | POA: Diagnosis not present

## 2022-12-05 DIAGNOSIS — S22088D Other fracture of T11-T12 vertebra, subsequent encounter for fracture with routine healing: Secondary | ICD-10-CM | POA: Diagnosis not present

## 2022-12-05 DIAGNOSIS — Z7901 Long term (current) use of anticoagulants: Secondary | ICD-10-CM | POA: Diagnosis not present

## 2022-12-05 DIAGNOSIS — Z9181 History of falling: Secondary | ICD-10-CM | POA: Diagnosis not present

## 2022-12-06 DIAGNOSIS — S22080A Wedge compression fracture of T11-T12 vertebra, initial encounter for closed fracture: Secondary | ICD-10-CM | POA: Diagnosis not present

## 2022-12-07 DIAGNOSIS — I4819 Other persistent atrial fibrillation: Secondary | ICD-10-CM | POA: Diagnosis not present

## 2022-12-07 DIAGNOSIS — Z7901 Long term (current) use of anticoagulants: Secondary | ICD-10-CM | POA: Diagnosis not present

## 2022-12-07 DIAGNOSIS — S22088D Other fracture of T11-T12 vertebra, subsequent encounter for fracture with routine healing: Secondary | ICD-10-CM | POA: Diagnosis not present

## 2022-12-07 DIAGNOSIS — Z9181 History of falling: Secondary | ICD-10-CM | POA: Diagnosis not present

## 2022-12-07 DIAGNOSIS — D509 Iron deficiency anemia, unspecified: Secondary | ICD-10-CM | POA: Diagnosis not present

## 2022-12-07 DIAGNOSIS — I10 Essential (primary) hypertension: Secondary | ICD-10-CM | POA: Diagnosis not present

## 2022-12-07 DIAGNOSIS — E785 Hyperlipidemia, unspecified: Secondary | ICD-10-CM | POA: Diagnosis not present

## 2022-12-07 DIAGNOSIS — Z950 Presence of cardiac pacemaker: Secondary | ICD-10-CM | POA: Diagnosis not present

## 2022-12-11 ENCOUNTER — Ambulatory Visit: Payer: Medicare Other | Admitting: Nurse Practitioner

## 2022-12-11 DIAGNOSIS — I10 Essential (primary) hypertension: Secondary | ICD-10-CM | POA: Diagnosis not present

## 2022-12-11 DIAGNOSIS — I4819 Other persistent atrial fibrillation: Secondary | ICD-10-CM | POA: Diagnosis not present

## 2022-12-11 DIAGNOSIS — D509 Iron deficiency anemia, unspecified: Secondary | ICD-10-CM | POA: Diagnosis not present

## 2022-12-11 DIAGNOSIS — Z9181 History of falling: Secondary | ICD-10-CM | POA: Diagnosis not present

## 2022-12-11 DIAGNOSIS — S22088D Other fracture of T11-T12 vertebra, subsequent encounter for fracture with routine healing: Secondary | ICD-10-CM | POA: Diagnosis not present

## 2022-12-11 DIAGNOSIS — Z950 Presence of cardiac pacemaker: Secondary | ICD-10-CM | POA: Diagnosis not present

## 2022-12-11 DIAGNOSIS — Z7901 Long term (current) use of anticoagulants: Secondary | ICD-10-CM | POA: Diagnosis not present

## 2022-12-11 DIAGNOSIS — E785 Hyperlipidemia, unspecified: Secondary | ICD-10-CM | POA: Diagnosis not present

## 2022-12-12 ENCOUNTER — Ambulatory Visit: Payer: Medicare Other | Attending: Nurse Practitioner | Admitting: Nurse Practitioner

## 2022-12-12 ENCOUNTER — Other Ambulatory Visit: Payer: Self-pay | Admitting: Family Medicine

## 2022-12-12 ENCOUNTER — Encounter: Payer: Self-pay | Admitting: Nurse Practitioner

## 2022-12-12 VITALS — BP 110/70 | HR 60 | Ht 63.0 in | Wt 145.6 lb

## 2022-12-12 DIAGNOSIS — E782 Mixed hyperlipidemia: Secondary | ICD-10-CM

## 2022-12-12 DIAGNOSIS — Z950 Presence of cardiac pacemaker: Secondary | ICD-10-CM

## 2022-12-12 DIAGNOSIS — Z79899 Other long term (current) drug therapy: Secondary | ICD-10-CM | POA: Diagnosis not present

## 2022-12-12 DIAGNOSIS — I480 Paroxysmal atrial fibrillation: Secondary | ICD-10-CM

## 2022-12-12 DIAGNOSIS — I495 Sick sinus syndrome: Secondary | ICD-10-CM | POA: Diagnosis not present

## 2022-12-12 DIAGNOSIS — Z86718 Personal history of other venous thrombosis and embolism: Secondary | ICD-10-CM

## 2022-12-12 DIAGNOSIS — I5032 Chronic diastolic (congestive) heart failure: Secondary | ICD-10-CM | POA: Diagnosis not present

## 2022-12-12 DIAGNOSIS — I4892 Unspecified atrial flutter: Secondary | ICD-10-CM

## 2022-12-12 DIAGNOSIS — R198 Other specified symptoms and signs involving the digestive system and abdomen: Secondary | ICD-10-CM

## 2022-12-12 DIAGNOSIS — I6523 Occlusion and stenosis of bilateral carotid arteries: Secondary | ICD-10-CM | POA: Diagnosis not present

## 2022-12-12 DIAGNOSIS — Z0279 Encounter for issue of other medical certificate: Secondary | ICD-10-CM

## 2022-12-12 NOTE — Patient Instructions (Signed)
Medication Instructions:  Your physician recommends that you continue on your current medications as directed. Please refer to the Current Medication list given to you today.  Labwork: BMET at [facility]  Testing/Procedures: none  Follow-Up: Your physician recommends that you schedule a follow-up appointment in: 2-3 Months with Philis Nettle  Any Other Special Instructions Will Be Listed Below (If Applicable).  If you need a refill on your cardiac medications before your next appointment, please call your pharmacy.

## 2022-12-12 NOTE — Progress Notes (Signed)
Cardiology Office Note:  .   Date:  12/12/2022  ID:  [name], DOB May 31, 1929, MRN [id number] PCP: Dettinger, Elige Radon, MD  [facility] Providers Cardiologist:  Nona Dell, MD Electrophysiologist:  Hillis Range, MD (Inactive)    History of Present Illness: .   [name] is a 89 y.o. female with a PMH of persistent A-fib, A-flutter, s/p RFA in 2003, HTN, mixed HLD, pulmonary HTN,  hx of bilateral DVT, SSS, s/p PPM, HFpEF, and hx of breast cancer, s/p lumpectomy, who presents today for 3-4 week follow-up.   Admitted 11/2021 for hypoxia, SHOB/DOE. Received IV diuresis/ ABX. D/C home with HHPT. Echo on 11/14/22 showed EF 55%, severe pulmonary HTN. Referred to Pulmonology. Followed by Palliative Care.  Last seen by Dr. Diona Browner on November 20, 2022. Weight was relatively stable. Started on Aldactone 12.5 mg daily, Lasix changed to standing 60 gm daily, may take 20 mg PRN for extra weight gain.   Today she presents for 3-4 week follow-up. She states she is doing well. Son states her weight has been stable since being started on Aldactone. Denies any chest pain, shortness of breath, palpitations, syncope, presyncope, dizziness, orthopnea, PND, swelling or significant weight changes, acute bleeding, or claudication. Does note some fullness along her abdomen at times, denies any abdominal pain.   Studies Reviewed: Marland Kitchen   EKG is ordered today and reveals AV paced rhythm, 60 bpm, with underlying A-fib.   Carotid duplex 05/2022: Summary:  Right Carotid: Velocities in the right ICA are consistent with a 1-39% stenosis.   Left Carotid: Velocities in the left ICA are consistent with a 1-39% stenosis.   Vertebrals: Bilateral vertebral arteries demonstrate antegrade flow. Subclavians: Normal flow hemodynamics were seen in bilateral subclavian arteries.    Limited echo on November 24, 2021:  1. Limited study with limited images   2. Left ventricular ejection fraction, by estimation, is  60 to 65%. Left  ventricular ejection fraction by 2D MOD biplane is 63.6 %. The left  ventricle has normal function. The left ventricle has no regional wall  motion abnormalities. There is moderate  left ventricular hypertrophy.   3. The right ventricular size is mildly enlarged. Tricuspid regurgitation  signal is inadequate for assessing PA pressure.   4. Right atrial size was severely dilated.   5. The aortic valve is tricuspid. Aortic valve regurgitation is not  visualized. Aortic valve sclerosis/calcification is present, without any  evidence of aortic stenosis.   6. The inferior vena cava is dilated in size with <50% respiratory  variability, suggesting right atrial pressure of 15 mmHg.   Comparison(s): No significant change from prior study. 07/03/2021: LVEF  60-65%.  Risk Assessment/Calculations:    CHA2DS2-VASc Score = 5  This indicates a 7.2% annual risk of stroke. The patient's score is based upon: CHF History: 1 HTN History: 1 Diabetes History: 0 Stroke History: 0 Vascular Disease History: 0 Age Score: 2 Gender Score: 1  Physical Exam:   VS:  BP 110/70   Pulse 60   Ht 5\' 3"  (1.6 m)   Wt 145 lb 9.6 oz (66 kg)   BMI 25.79 kg/m    Wt Readings from Last 3 Encounters:  12/12/22 145 lb 9.6 oz (66 kg)  11/27/22 148 lb (67.1 kg)  11/20/22 149 lb (67.6 kg)    GEN: Well nourished, well developed in no acute distress NECK: No JVD; No carotid bruits CARDIAC: S1/S2, RRR, no murmurs, rubs, gallops RESPIRATORY:  Clear to auscultation  without rales, wheezing or rhonchi, fine diminished crackles to posterior lower lobes bilaterally ABDOMEN:  Overall soft with small area of firmness along right side of patient's navel, overall non-tender, non-distended EXTREMITIES:  No edema; No deformity   ASSESSMENT AND PLAN: .    Chronic HFpEF, medication management Stage C, NYHA class I-II symptoms. Limited echo in June 2023 revealed EF 60 to 65%, moderate LVH. Euvolemic and well  compensated on exam. Low sodium diet, fluid restriction <2L, and daily weights encouraged. Educated to contact our office for weight gain of 2 lbs overnight or 5 lbs in one week.  Continue current medication regimen. Heart healthy diet and regular cardiovascular exercise encouraged. Will obtain BMET in 1 week as this was arranged by Dr. Diona Browner, but has not been completed.   PAF, A-flutter s/p RFA in 2003 Denies any tachycardia or palpitations. Continue current medication regimen. Heart healthy diet and regular cardiovascular exercise encouraged.   SSS, s/p PPM Most recent remote device check revealed normal device function without any events or anything acute. Continue to follow-up with EP.   Mixed HLD Labs from October 2023 revealed total cholesterol 162, HDL 57, LDL 90, and triglycerides 76. Continue Zetia. Heart healthy diet and regular cardiovascular exercise encouraged. Has deferred rechecking FLP to Dr. Louanne Skye, has upcoming OV.   Carotid artery stenosis Mild bilateral carotid artery stenosis, 1 to 39%.  Asymptomatic.  Continue current medication regimen. Heart healthy diet and regular cardiovascular exercise encouraged.   Hx of bilateral DVT Denies any leg swelling or claudication. She is compliant with Eliquis. Denies any bleeding issues. Continue Eliquis 5 mg twice daily.   7. Abdominal fullness Etiology uncertain. PE as mentioned above. Will see PCP for upcoming appt, recommended to discuss with PE.      Dispo: Follow-up with me or APP in 2-3 months or sooner if anything changes.   Signed, Sharlene Dory, NP

## 2022-12-13 DIAGNOSIS — Z9181 History of falling: Secondary | ICD-10-CM | POA: Diagnosis not present

## 2022-12-13 DIAGNOSIS — I10 Essential (primary) hypertension: Secondary | ICD-10-CM | POA: Diagnosis not present

## 2022-12-13 DIAGNOSIS — D509 Iron deficiency anemia, unspecified: Secondary | ICD-10-CM | POA: Diagnosis not present

## 2022-12-13 DIAGNOSIS — Z950 Presence of cardiac pacemaker: Secondary | ICD-10-CM | POA: Diagnosis not present

## 2022-12-13 DIAGNOSIS — E785 Hyperlipidemia, unspecified: Secondary | ICD-10-CM | POA: Diagnosis not present

## 2022-12-13 DIAGNOSIS — I4819 Other persistent atrial fibrillation: Secondary | ICD-10-CM | POA: Diagnosis not present

## 2022-12-13 DIAGNOSIS — Z7901 Long term (current) use of anticoagulants: Secondary | ICD-10-CM | POA: Diagnosis not present

## 2022-12-13 DIAGNOSIS — S22088D Other fracture of T11-T12 vertebra, subsequent encounter for fracture with routine healing: Secondary | ICD-10-CM | POA: Diagnosis not present

## 2022-12-18 ENCOUNTER — Other Ambulatory Visit: Payer: Medicare Other

## 2022-12-18 DIAGNOSIS — Z79899 Other long term (current) drug therapy: Secondary | ICD-10-CM | POA: Diagnosis not present

## 2022-12-19 DIAGNOSIS — Z950 Presence of cardiac pacemaker: Secondary | ICD-10-CM | POA: Diagnosis not present

## 2022-12-19 DIAGNOSIS — D509 Iron deficiency anemia, unspecified: Secondary | ICD-10-CM | POA: Diagnosis not present

## 2022-12-19 DIAGNOSIS — E785 Hyperlipidemia, unspecified: Secondary | ICD-10-CM | POA: Diagnosis not present

## 2022-12-19 DIAGNOSIS — Z7901 Long term (current) use of anticoagulants: Secondary | ICD-10-CM | POA: Diagnosis not present

## 2022-12-19 DIAGNOSIS — I4819 Other persistent atrial fibrillation: Secondary | ICD-10-CM | POA: Diagnosis not present

## 2022-12-19 DIAGNOSIS — Z9181 History of falling: Secondary | ICD-10-CM | POA: Diagnosis not present

## 2022-12-19 DIAGNOSIS — I10 Essential (primary) hypertension: Secondary | ICD-10-CM | POA: Diagnosis not present

## 2022-12-19 DIAGNOSIS — S22088D Other fracture of T11-T12 vertebra, subsequent encounter for fracture with routine healing: Secondary | ICD-10-CM | POA: Diagnosis not present

## 2022-12-19 LAB — BASIC METABOLIC PANEL
BUN/Creatinine Ratio: 19 (ref 12–28)
BUN: 18 mg/dL (ref 10–36)
CO2: 23 mmol/L (ref 20–29)
Calcium: 9.6 mg/dL (ref 8.7–10.3)
Chloride: 93 mmol/L — ABNORMAL LOW (ref 96–106)
Creatinine, Ser: 0.93 mg/dL (ref 0.57–1.00)
Glucose: 106 mg/dL — ABNORMAL HIGH (ref 70–99)
Potassium: 4.6 mmol/L (ref 3.5–5.2)
Sodium: 131 mmol/L — ABNORMAL LOW (ref 134–144)
eGFR: 58 mL/min/{1.73_m2} — ABNORMAL LOW (ref >59)

## 2022-12-20 ENCOUNTER — Ambulatory Visit (HOSPITAL_BASED_OUTPATIENT_CLINIC_OR_DEPARTMENT_OTHER): Payer: Medicare Other | Admitting: Pulmonary Disease

## 2022-12-20 ENCOUNTER — Other Ambulatory Visit: Payer: Self-pay | Admitting: Family Medicine

## 2022-12-20 ENCOUNTER — Encounter (HOSPITAL_BASED_OUTPATIENT_CLINIC_OR_DEPARTMENT_OTHER): Payer: Self-pay | Admitting: Pulmonary Disease

## 2022-12-20 VITALS — BP 120/66 | HR 67 | Temp 98.0°F | Ht 63.0 in | Wt 147.6 lb

## 2022-12-20 DIAGNOSIS — R5381 Other malaise: Secondary | ICD-10-CM | POA: Diagnosis not present

## 2022-12-20 NOTE — Patient Instructions (Signed)
Probable pulmonary HTN  --Reviewed echo report. --Discussed risks and benefits of right heart cath however after shared decision making, patient/family have declined as she is not interested in advanced therapies --Oxygenation appropriate and reported PT O2 saturation 98% --Counseled on oxygen monitoring at home

## 2022-12-20 NOTE — Progress Notes (Signed)
Subjective:   PATIENT ID: [name] GENDER: female DOB: Feb 10, 1929, MRN: [id number]  Chief Complaint  Patient presents with   Consult    Pulmonary hypertension    Reason for Visit: New consult for pulmonary hypertension  [name] is a 89 year old female never smoker with HTN, SSS, atrial fibrillation, s/p PPM, chronic diastolic heart failure, hx DVT on Eliquis, hx breast cancer s/p lumpectomy. Presents for evaluation for pulmonary hypertension and hospital follow-up.  She was recently hospitalized for heart failure from 5/23-5/26/24 and treated with diuresis. She was hospitalized from 6/3-/6/5 for heart failure again as well has right pneumonia and required 3-4L O2.  Seen in follow-up with PCP, Dr. Louanne Skye on 6/17 and given augmentin for concern for pneumonia. Seen in follow-up by Cardiology NP Philis Nettle on 12/12/22 and well controlled on diuretics, low sodium diet and fluid restriction. She reports stable weights since hospital discharge at 145-146 lbs. Denies swelling in lower extremities. Reports breathing is overall well. Bending over will cause her to be short of breath. Not on oxygen. Referred to Pulmonary after her second hospitalization for severe pulmonary hypertension seen on echo. Currently on pulmonary hypertension.  Social History: Never smoker  I have personally reviewed patient's past medical/family/social history, allergies, current medications.  Past Medical History:  Diagnosis Date   Arthritis    Atrial flutter (HCC)    s/p RFA 2003   Breast cancer (HCC)    1980's - lumpectomy only   Colon polyps    Diverticulosis    DVT, lower extremity (HCC) 1998 and 1999   Bilateral   Essential hypertension    Gallstones    GERD (gastroesophageal reflux disease)    Glaucoma    IBS (irritable bowel syndrome)    Mixed hyperlipidemia    Osteoporosis    Persistent atrial fibrillation (HCC)    Pneumonia 2011   Sick sinus syndrome (HCC)    PPM ([facility]JM)    Urinary tract infection    Varicose veins      Family History  Problem Relation Age of Onset   Throat cancer Father    Stroke Mother    Alzheimer's disease Mother    Alzheimer's disease Brother    Alzheimer's disease Sister    Colon cancer Maternal Grandfather    Stomach cancer Maternal Grandmother    Heart disease Paternal Grandmother    Heart attack Paternal Grandmother    Prostate cancer Son    Colon cancer Sister    Colon cancer Brother    Heart disease Brother    Atrial fibrillation Brother    Diabetes Brother    Dementia Brother    Parkinson's disease Brother    Prostate cancer Son      Social History   Occupational History   Occupation: Retired    Comment: resturant / cafe   Tobacco Use   Smoking status: Never   Smokeless tobacco: Never  Vaping Use   Vaping Use: Never used  Substance and Sexual Activity   Alcohol use: No    Alcohol/week: 0.0 standard drinks of alcohol   Drug use: No   Sexual activity: Not on file    Allergies  Allergen Reactions   Naproxen Hives, Itching, Rash and Other (See Comments)   Iodinated Contrast Media Hives, Itching, Rash and Other (See Comments)    03/13/14: symptoms began within two hours of intrathecal injection (myelogram 03/11/14); on shoulders radiating down to stomach.  Relief with Benadryl.  Told patient she would need  to pre-med with Benadryl in the future before receiving this contrast.  Donell Sievert, RN     Outpatient Medications Prior to Visit  Medication Sig Dispense Refill   acetaminophen (TYLENOL) 500 MG tablet Take 1,000 mg by mouth every 6 (six) hours as needed for mild pain.     amiodarone (PACERONE) 200 MG tablet TAKE ONE (1) TABLET EACH DAY 90 tablet 0   apixaban (ELIQUIS) 5 MG TABS tablet TAKE ONE (1) TABLET BY MOUTH TWO (2) TIMES DAILY 180 tablet 0   calcium-vitamin D (OSCAL WITH D) 500-200 MG-UNIT tablet Take 1 tablet by mouth daily with breakfast.      ciclopirox (LOPROX) 0.77 % cream Apply topically 2  (two) times daily. 90 g 1   cyanocobalamin (VITAMIN B12) 1000 MCG/ML injection Inject 1 mL (1,000 mcg total) into the muscle every 30 (thirty) days. 4 mL 3   ezetimibe (ZETIA) 10 MG tablet Take 1 tablet (10 mg total) by mouth daily. 90 tablet 3   famotidine (PEPCID) 20 MG tablet Take 1 tablet (20 mg total) by mouth 2 (two) times daily. 60 tablet 1   furosemide (LASIX) 40 MG tablet Take 1.5 tablets (60 mg total) by mouth daily. May take an extra 20 mg daily as needed for weight gain of 2 lbs/day or 5 lbs/week 150 tablet 1   Iron, Ferrous Sulfate, 325 (65 Fe) MG TABS Take 325 mg by mouth daily. 90 tablet 1   latanoprost (XALATAN) 0.005 % ophthalmic solution Place 1 drop into both eyes at bedtime.     megestrol (MEGACE) 40 MG tablet Take 1 tablet (40 mg total) by mouth 2 (two) times daily. With breakfast and lunch 180 tablet 1   Multiple Vitamin (MULTIVITAMIN) tablet Take 1 tablet by mouth every morning.     nadolol (CORGARD) 40 MG tablet Take 0.5 tablets (20 mg total) by mouth 2 (two) times daily. 45 tablet 2   nitroGLYCERIN (NITROSTAT) 0.4 MG SL tablet Place 1 tablet (0.4 mg total) under the tongue every 5 (five) minutes as needed for chest pain. DISSOLVE 1 TAB UNDER TOUNGE FOR CHEST PAIN. MAY REPEAT EVERY 5 MINUTES FOR 3 DOSES. IF NO RELIEF CALL 911 OR GO TO ER Strength: 0.4 mg 25 tablet 3   nystatin (MYCOSTATIN/NYSTOP) powder Apply 1 Application topically 3 (three) times daily. 60 g 1   omeprazole (PRILOSEC) 20 MG capsule TAKE ONE CAPSULE BY MOUTH DAILY 30 capsule 0   spironolactone (ALDACTONE) 25 MG tablet Take 0.5 tablets (12.5 mg total) by mouth daily. 45 tablet 1   Wheat Dextrin (BENEFIBER) POWD Take 1 Dose by mouth daily as needed (constipation).     Facility-Administered Medications Prior to Visit  Medication Dose Route Frequency Provider Last Rate Last Admin   bupivacaine liposome (EXPAREL) 1.3 % injection 266 mg  20 mL Infiltration Once [place], [facility], PA-C        Review of Systems   Constitutional:  Negative for chills, diaphoresis, fever, malaise/fatigue and weight loss.  HENT:  Negative for congestion.   Respiratory:  Positive for shortness of breath. Negative for cough, hemoptysis, sputum production and wheezing.   Cardiovascular:  Negative for chest pain, palpitations and leg swelling.     Objective:   Vitals:   12/20/22 1106  BP: 120/66  Pulse: 67  Temp: 98 F (36.7 C)  TempSrc: Oral  SpO2: 95%  Weight: 147 lb 9.6 oz (67 kg)  Height: 5\' 3"  (1.6 m)   SpO2: 95 % O2 Device: None (Room  air)  Physical Exam: General: Elderly, well-appearing, no acute distress HENT: [place], AT Eyes: EOMI, no scleral icterus Respiratory: Clear to auscultation bilaterally.  No crackles, wheezing or rales Cardiovascular: RRR, -M/R/G, no JVD Extremities:-Edema,-tenderness Neuro: AAO x4, CNII-XII grossly intact Psych: Normal mood, normal affect  Data Reviewed:  Imaging: CXR 11/02/22 - Chronic interstitial markings  PFT: None on file  Labs:  Echo: 11/14/22 Exam Date: 11/14/2022 10:37 AM Admit Date: 11/13/2022 Exam Type: ECHOCARDIOGRAM W COLORFLOW SPECTRAL DOPPLER Technical Quality: Fair Staff Sonographer: Rocky Link Supervising Physician: Hubert Azure MD Referring Physician: Jetty Peeks Study Info Indications - CHF, limited echo last June 2023 Procedure(s) Complete two-dimensional, color flow and Doppler transthoracic echocardiogram is performed. Summary 1. The left ventricle is normal in size with mildly increased wall thickness. 2. The left ventricular systolic function is normal, LVEF is visually estimated at > 55%. 3. There is mild mitral valve regurgitation. 4. There is mild aortic valve stenosis. 5. The left atrium is moderately dilated in size. 6. The right ventricle is mildly dilated in size, with normal systolic function. 7. There is mild to moderate tricuspid regurgitation. 8. There is severe pulmonary hypertension. 9. The right atrium is moderately dilated in  size. Left Ventricle The left ventricle is normal in size with mildly increased wall thickness. The left ventricular systolic function is normal, LVEF is visually estimated at > 55%. The left ventricular diastolic function is indeterminate. Right Ventricle The right ventricle is mildly dilated in size, with normal systolic function. Left Atrium The left atrium is moderately dilated in size. Right Atrium The right atrium is moderately dilated in size. Aortic Valve The aortic valve is trileaflet with moderately thickened leaflets with mildly reduced excursion. There is trivial aortic regurgitation. There is mild aortic valve stenosis. Peak AV transvalvular velocity: 2.0 m/s. Mean gradient: 9 mmHg. Doppler velocity index: 0.63. Estimated aortic valve area (VTI): 1.8 cm2. Estimated aortic valve area (velocity): 1.8 cm2. LVOT diameter: 1.9 cm. LVOT stroke volume index: 43 ml/m2. Mitral Valve The mitral valve leaflets are mildly thickened with normal leaflet mobility. Mitral annular calcification is present. There is mild mitral valve regurgitation. Tricuspid Valve The tricuspid valve leaflets are normal, with normal leaflet mobility. There is mild to moderate tricuspid regurgitation. There is severe pulmonary hypertension. TR maximum velocity: 3.8 m/s Estimated PASP: 66 mmHg. Pulmonic Valve The pulmonic valve is normal. There is mild pulmonic regurgitation. There is no evidence of a significant transvalvular gradient. Aorta The aorta is normal in size in the visualized segments. Inferior Vena Cava IVC size and inspiratory change suggest mildly elevated right atrial pressure. (5-10 mmHg). Pericardium/Pleural There is no pericardial effusion. Other Findings Rhythm: Paced. Ventricles ---------------------------------------------------------------------- Name Value Normal ---------------------------------------------------------------------- LV Dimensions 2D/MM  ---------------------------------------------------------------------- IVS Diastolic Thickness (2D) 1.2 cm 0.6-0.9 LVID Diastole (2D) 4.3 cm 3.8-5.2 LVPW Diastolic Thickness (2D) 1.2 cm 0.6-0.9 LVID Systole (2D) 2.7 cm 2.2-3.5 LVOT Diameter 1.9 cm LV Mass Index (2D Cubed) 103 g/m2 43-95 Relative Wall Thickness (2D) 0.56 LV Function ---------------------------------------------------------------------- LV EF (4C MOD) 45 % LV Diastolic Volume Index (BP MOD) 27.5 ml/m2 29.0-61.0 LV EF (BP MOD) 56 % 54-74 RV Dimensions 2D/MM ---------------------------------------------------------------------- RV Basal Diastolic Dimension 4.0 cm 2.5-4.1 TAPSE 2.6 cm >=1.7 Atria ---------------------------------------------------------------------- Name Value Normal ---------------------------------------------------------------------- LA Dimensions ---------------------------------------------------------------------- LA Dimension (2D) 4.6 cm 2.7-3.8 LA Volume Index (4C A-L) 32.06 ml/m2 LA Volume Index (2C A-L) 22.58 ml/m2 LA Volume (BP MOD) 45 ml LA Volume Index (BP MOD) 25.13 ml/m2 16.00-34.00 RA Dimensions ---------------------------------------------------------------------- RA Area (4C)  21.5 cm2 <=18.0 RA Area (4C) Index 12.0 cm2/m2 RA ESV Index (4C MOD) 38 ml/m2 15-27 Left Ventricular Outflow Tract ---------------------------------------------------------------------- Name Value Normal ---------------------------------------------------------------------- LVOT 2D ---------------------------------------------------------------------- LVOT Diameter 1.9 cm LVOT Area 2.8 cm2 LVOT Doppler ---------------------------------------------------------------------- LVOT Peak Velocity 1.3 m/s LVOT VTI 27 cm LVOT Stroke Volume 78 ml LVOT SI 43 ml/m2 Aortic Valve ---------------------------------------------------------------------- Name Value Normal ---------------------------------------------------------------------- AV Doppler  ---------------------------------------------------------------------- AV Peak Velocity 2.0 m/s AV Peak Gradient 16 mmHg AV Mean Gradient 9 mmHg AV VTI 43 cm AV Area (Cont Eq VTI) 1.8 cm2 >=3.0 AV Area Index (Cont Eq VTI) 1.0 cm2/m2 AV Area (Cont Eq Vel) 1.8 cm2 AV Area Index (Cont Eq Vel) 1.0 cm2/m2 AV DI (Vel) 0.63 Mitral Valve ---------------------------------------------------------------------- Name Value Normal ---------------------------------------------------------------------- MV Diastolic Function ---------------------------------------------------------------------- MV E Peak Velocity 101 cm/s MV A Peak Velocity 35 cm/s MV E/A 2.9 MV Annular TDI ---------------------------------------------------------------------- MV Septal e' Velocity 7.3 cm/s >=8.0 MV E/e' (Septal) 13.8 MV Lateral e' Velocity 10.3 cm/s >=10.0 MV E/e' (Lateral) 9.8 MV e' Average 8.8 cm/s MV E/e' (Average) 11.8 Tricuspid Valve ---------------------------------------------------------------------- Name Value Normal ---------------------------------------------------------------------- TV Regurgitation Doppler ---------------------------------------------------------------------- TR Peak Velocity 3.8 m/s Estimated PAP/RSVP ---------------------------------------------------------------------- RA Pressure 8 mmHg <=5 RV Systolic Pressure 66 mmHg <36 Aorta ---------------------------------------------------------------------- Name Value Normal ---------------------------------------------------------------------- Ascending Aorta ---------------------------------------------------------------------- Ao Root Diameter (2D) 2.9 cm Ao Root Diam Index (2D) 1.6 cm/m2 Asc Ao Diameter 3.7 cm Venous ---------------------------------------------------------------------- Name Value Normal ---------------------------------------------------------------------- IVC/SVC ---------------------------------------------------------------------- IVC Diameter (Exp  2D) 2.0 cm <=2.1 Report Signatures Finalized by Hubert Azure MD on 11/14/2022 12:20 PM     Assessment & Plan:   Discussion: 89 year old female never smoker with HTN, SSS, atrial fibrillation, s/p PPM, chronic diastolic heart failure, hx DVT on Eliquis, hx breast cancer s/p lumpectomy. Presents for evaluation for pulmonary hypertension and hospital follow-up. Reviewed hospital course. Currently asymptomatic and well-controlled on current diuretics. Suspect deconditioning contributing to her dyspnea. Good oxygenation during therapy and in-office.  Probable pulmonary HTN  --Reviewed echo report. --Discussed risks and benefits of right heart cath however after shared decision making, patient/family have declined as she is not interested in advanced therapies --Oxygenation appropriate and reported PT O2 saturation 98% --Counseled on oxygen monitoring at home  Health Maintenance Immunization History  Administered Date(s) Administered   Fluad Quad(high Dose 65+) 03/18/2019, 03/08/2021   Influenza, High Dose Seasonal PF 04/02/2015, 04/25/2017, 03/13/2018   Influenza,trivalent, recombinat, inj, PF 03/13/2014   Moderna Sars-Covid-2 Vaccination 08/11/2019, 09/08/2019   Pneumococcal Conjugate-13 04/02/2015   Pneumococcal Polysaccharide-23 07/22/2007, 08/16/2016   Td 08/11/2019   Tdap 01/26/2010   CT Lung Screen - not qualified.  Orders Placed This Encounter  Procedures   Ambulatory referral to Home Health    Referral Priority:   Routine    Referral Type:   Home Health Care    Referral Reason:   Specialty Services Required    Requested Specialty:   Home Health Services    Number of Visits Requested:   1  No orders of the defined types were placed in this encounter.   Return in about 6 months (around 06/22/2023) for routine follow-up.  I have spent a total time of 60-minutes on the day of the appointment reviewing prior documentation, coordinating care and discussing medical diagnosis and  plan with the patient/family. Imaging, labs and tests included in this note have been reviewed and interpreted independently by me.  Kehinde Bowdish Mechele Collin, MD [facility] 12/20/2022 11:59 AM  Office Number [phone]

## 2022-12-27 ENCOUNTER — Other Ambulatory Visit: Payer: Self-pay | Admitting: Family Medicine

## 2022-12-28 ENCOUNTER — Other Ambulatory Visit: Payer: Self-pay

## 2022-12-28 DIAGNOSIS — D509 Iron deficiency anemia, unspecified: Secondary | ICD-10-CM

## 2022-12-29 ENCOUNTER — Ambulatory Visit: Payer: Medicare Other | Admitting: Family Medicine

## 2022-12-31 DIAGNOSIS — I5043 Acute on chronic combined systolic (congestive) and diastolic (congestive) heart failure: Secondary | ICD-10-CM | POA: Diagnosis not present

## 2022-12-31 DIAGNOSIS — R0902 Hypoxemia: Secondary | ICD-10-CM | POA: Diagnosis not present

## 2023-01-01 ENCOUNTER — Ambulatory Visit (INDEPENDENT_AMBULATORY_CARE_PROVIDER_SITE_OTHER): Payer: Medicare Other

## 2023-01-01 DIAGNOSIS — I495 Sick sinus syndrome: Secondary | ICD-10-CM

## 2023-01-01 DIAGNOSIS — I480 Paroxysmal atrial fibrillation: Secondary | ICD-10-CM | POA: Diagnosis not present

## 2023-01-02 ENCOUNTER — Other Ambulatory Visit: Payer: Self-pay

## 2023-01-02 LAB — CUP PACEART REMOTE DEVICE CHECK
Battery Remaining Longevity: 99 mo
Battery Remaining Percentage: 89 %
Battery Voltage: 3.01 V
Brady Statistic AP VP Percent: 99 %
Brady Statistic AP VS Percent: 1 %
Brady Statistic AS VP Percent: 1 %
Brady Statistic AS VS Percent: 0 %
Brady Statistic RA Percent Paced: 99 %
Brady Statistic RV Percent Paced: 99 %
Date Time Interrogation Session: 20240723095343
Implantable Lead Connection Status: 753985
Implantable Lead Connection Status: 753985
Implantable Lead Implant Date: 20031028
Implantable Lead Implant Date: 20031028
Implantable Lead Location: 753859
Implantable Lead Location: 753860
Implantable Pulse Generator Implant Date: 20230421
Lead Channel Impedance Value: 390 Ohm
Lead Channel Impedance Value: 460 Ohm
Lead Channel Pacing Threshold Amplitude: 0.5 V
Lead Channel Pacing Threshold Amplitude: 0.5 V
Lead Channel Pacing Threshold Pulse Width: 0.5 ms
Lead Channel Pacing Threshold Pulse Width: 0.5 ms
Lead Channel Sensing Intrinsic Amplitude: 10.6 mV
Lead Channel Setting Pacing Amplitude: 0.75 V
Lead Channel Setting Pacing Amplitude: 2 V
Lead Channel Setting Pacing Pulse Width: 0.5 ms
Lead Channel Setting Sensing Sensitivity: 4 mV
Pulse Gen Model: 2272
Pulse Gen Serial Number: 8074688

## 2023-01-05 ENCOUNTER — Other Ambulatory Visit: Payer: Medicare Other

## 2023-01-05 DIAGNOSIS — D509 Iron deficiency anemia, unspecified: Secondary | ICD-10-CM | POA: Diagnosis not present

## 2023-01-10 ENCOUNTER — Ambulatory Visit (INDEPENDENT_AMBULATORY_CARE_PROVIDER_SITE_OTHER): Payer: Medicare Other | Admitting: Family Medicine

## 2023-01-10 ENCOUNTER — Other Ambulatory Visit: Payer: Self-pay | Admitting: Family Medicine

## 2023-01-10 ENCOUNTER — Encounter: Payer: Self-pay | Admitting: Family Medicine

## 2023-01-10 VITALS — BP 118/69 | HR 60 | Ht 63.0 in | Wt 149.0 lb

## 2023-01-10 DIAGNOSIS — I10 Essential (primary) hypertension: Secondary | ICD-10-CM

## 2023-01-10 DIAGNOSIS — D509 Iron deficiency anemia, unspecified: Secondary | ICD-10-CM | POA: Diagnosis not present

## 2023-01-10 DIAGNOSIS — I5042 Chronic combined systolic (congestive) and diastolic (congestive) heart failure: Secondary | ICD-10-CM | POA: Diagnosis not present

## 2023-01-10 NOTE — Progress Notes (Signed)
BP 118/69   Pulse 86 Comment: Manual  Ht 5\' 3"  (1.6 m)   Wt 149 lb (67.6 kg)   BMI 26.39 kg/m    Subjective:   Patient ID: [name], female    DOB: 1929/02/25, 89 y.o.   MRN: [id number]  HPI: [name] is a 89 y.o. female presenting on 01/10/2023 for Medical Management of Chronic Issues and Anemia   HPI Hypertension and A-fib and CHF recheck Patient is currently on amiodarone and Eliquis and furosemide and nadolol and spironolactone, and their blood pressure today is 118/69. Patient denies any lightheadedness or dizziness. Patient denies headaches, blurred vision, chest pains, shortness of breath, or weakness. Denies any side effects from medication and is content with current medication.   Anemia recheck Patient is coming in today for anemia recheck.  Her hemoglobin has been stable at 8.5 recently.  Her ferritin and iron levels have been better recently.  Her iron counts were up as well.  She is continue to take iron.  She denies any bruising or bleeding.  Relevant past medical, surgical, family and social history reviewed and updated as indicated. Interim medical history since our last visit reviewed. Allergies and medications reviewed and updated.  Review of Systems  Constitutional:  Positive for fatigue. Negative for chills and fever.  Eyes:  Negative for visual disturbance.  Respiratory:  Negative for chest tightness and shortness of breath.   Cardiovascular:  Negative for chest pain and leg swelling.  Genitourinary:  Negative for difficulty urinating and dysuria.  Musculoskeletal:  Negative for back pain and gait problem.  Skin:  Negative for rash.  Neurological:  Negative for light-headedness and headaches.  Psychiatric/Behavioral:  Negative for agitation and behavioral problems.   All other systems reviewed and are negative.   Per HPI unless specifically indicated above   Allergies as of 01/10/2023       Reactions   Naproxen Hives, Itching,  Rash, Other (See Comments)   Iodinated Contrast Media Hives, Itching, Rash, Other (See Comments)   03/13/14: symptoms began within two hours of intrathecal injection (myelogram 03/11/14); on shoulders radiating down to stomach.  Relief with Benadryl.  Told patient she would need to pre-med with Benadryl in the future before receiving this contrast.  Donell Sievert, RN        Medication List        Accurate as of January 10, 2023 11:24 AM. If you have any questions, ask your nurse or doctor.          STOP taking these medications    famotidine 20 MG tablet Commonly known as: Pepcid Stopped by: Elige Radon Demarques Pilz       TAKE these medications    acetaminophen 500 MG tablet Commonly known as: TYLENOL Take 1,000 mg by mouth every 6 (six) hours as needed for mild pain.   amiodarone 200 MG tablet Commonly known as: PACERONE TAKE ONE (1) TABLET EACH DAY   Benefiber Powd Take 1 Dose by mouth daily as needed (constipation).   calcium-vitamin D 500-200 MG-UNIT tablet Commonly known as: OSCAL WITH D Take 1 tablet by mouth daily with breakfast.   ciclopirox 0.77 % cream Commonly known as: Loprox Apply topically 2 (two) times daily.   cyanocobalamin 1000 MCG/ML injection Commonly known as: VITAMIN B12 Inject 1 mL (1,000 mcg total) into the muscle every 30 (thirty) days.   Eliquis 5 MG Tabs tablet Generic drug: apixaban TAKE ONE (1) TABLET BY MOUTH TWO (2) TIMES DAILY  ezetimibe 10 MG tablet Commonly known as: ZETIA Take 1 tablet (10 mg total) by mouth daily.   furosemide 40 MG tablet Commonly known as: LASIX Take 1.5 tablets (60 mg total) by mouth daily. May take an extra 20 mg daily as needed for weight gain of 2 lbs/day or 5 lbs/week   Iron (Ferrous Sulfate) 325 (65 Fe) MG Tabs Take 325 mg by mouth daily.   latanoprost 0.005 % ophthalmic solution Commonly known as: XALATAN Place 1 drop into both eyes at bedtime.   megestrol 40 MG tablet Commonly known as:  MEGACE Take 1 tablet (40 mg total) by mouth 2 (two) times daily. With breakfast and lunch   multivitamin tablet Take 1 tablet by mouth every morning.   nadolol 40 MG tablet Commonly known as: CORGARD Take 0.5 tablets (20 mg total) by mouth 2 (two) times daily.   nitroGLYCERIN 0.4 MG SL tablet Commonly known as: NITROSTAT Place 1 tablet (0.4 mg total) under the tongue every 5 (five) minutes as needed for chest pain. DISSOLVE 1 TAB UNDER TOUNGE FOR CHEST PAIN. MAY REPEAT EVERY 5 MINUTES FOR 3 DOSES. IF NO RELIEF CALL 911 OR GO TO ER Strength: 0.4 mg   nystatin powder Commonly known as: MYCOSTATIN/NYSTOP Apply 1 Application topically 3 (three) times daily.   omeprazole 20 MG capsule Commonly known as: PRILOSEC TAKE ONE CAPSULE BY MOUTH DAILY   spironolactone 25 MG tablet Commonly known as: ALDACTONE Take 0.5 tablets (12.5 mg total) by mouth daily.         Objective:   BP 118/69   Pulse 86 Comment: Manual  Ht 5\' 3"  (1.6 m)   Wt 149 lb (67.6 kg)   BMI 26.39 kg/m   Wt Readings from Last 3 Encounters:  01/10/23 149 lb (67.6 kg)  12/20/22 147 lb 9.6 oz (67 kg)  12/12/22 145 lb 9.6 oz (66 kg)    Physical Exam Vitals and nursing note reviewed.  Constitutional:      General: She is not in acute distress.    Appearance: She is well-developed. She is not diaphoretic.  Eyes:     Conjunctiva/sclera: Conjunctivae normal.  Cardiovascular:     Rate and Rhythm: Normal rate. Rhythm irregular.     Heart sounds: Normal heart sounds. No murmur heard. Pulmonary:     Effort: Pulmonary effort is normal. No respiratory distress.     Breath sounds: Normal breath sounds. No wheezing or rhonchi.  Musculoskeletal:        General: No swelling. Normal range of motion.  Skin:    General: Skin is warm and dry.     Findings: No rash.  Neurological:     Mental Status: She is alert and oriented to person, place, and time.     Coordination: Coordination normal.  Psychiatric:         Behavior: Behavior normal.       Assessment & Plan:   Problem List Items Addressed This Visit       Cardiovascular and Mediastinum   Essential hypertension, benign - Primary (Chronic)   Relevant Orders   CBC with Differential/Platelet   CMP14+EGFR   Chronic CHF (congestive heart failure) (HCC)   Relevant Orders   CBC with Differential/Platelet   CMP14+EGFR     Other   Iron deficiency anemia (Chronic)   Relevant Orders   CBC with Differential/Platelet   CMP14+EGFR  Irregular rhythm with a heart rate is normal on exam.  Fingers get cold and the oxygen did not  pick up but she seems to be breathing fine.  Will recheck anemia levels  Follow up plan: Return if symptoms worsen or fail to improve, for 2 to 77-month anemia recheck.  Counseling provided for all of the vaccine components Orders Placed This Encounter  Procedures   CBC with Differential/Platelet   CMP14+EGFR    Arville Care, MD [facility] 01/10/2023, 11:24 AM

## 2023-01-11 LAB — CBC WITH DIFFERENTIAL/PLATELET
Basophils Absolute: 0 10*3/uL (ref 0.0–0.2)
Basos: 0 %
EOS (ABSOLUTE): 0.1 10*3/uL (ref 0.0–0.4)
Eos: 2 %
Hematocrit: 34 % (ref 34.0–46.6)
Hemoglobin: 10.9 g/dL — ABNORMAL LOW (ref 11.1–15.9)
Immature Grans (Abs): 0 10*3/uL (ref 0.0–0.1)
Immature Granulocytes: 0 %
Lymphocytes Absolute: 1 10*3/uL (ref 0.7–3.1)
Lymphs: 29 %
MCH: 31 pg (ref 26.6–33.0)
MCHC: 32.1 g/dL (ref 31.5–35.7)
MCV: 97 fL (ref 79–97)
Monocytes Absolute: 0.6 10*3/uL (ref 0.1–0.9)
Monocytes: 16 %
Neutrophils Absolute: 1.9 10*3/uL (ref 1.4–7.0)
Neutrophils: 53 %
Platelets: 133 10*3/uL — ABNORMAL LOW (ref 150–450)
RBC: 3.52 x10E6/uL — ABNORMAL LOW (ref 3.77–5.28)
RDW: 20.9 % — ABNORMAL HIGH (ref 11.7–15.4)
WBC: 3.6 10*3/uL (ref 3.4–10.8)

## 2023-01-11 LAB — CMP14+EGFR
ALT: 16 IU/L (ref 0–32)
AST: 23 IU/L (ref 0–40)
Albumin: 4.2 g/dL (ref 3.6–4.6)
Alkaline Phosphatase: 57 IU/L (ref 44–121)
BUN/Creatinine Ratio: 22 (ref 12–28)
BUN: 19 mg/dL (ref 10–36)
Bilirubin Total: 0.7 mg/dL (ref 0.0–1.2)
CO2: 28 mmol/L (ref 20–29)
Calcium: 10 mg/dL (ref 8.7–10.3)
Chloride: 94 mmol/L — ABNORMAL LOW (ref 96–106)
Creatinine, Ser: 0.87 mg/dL (ref 0.57–1.00)
Globulin, Total: 2.1 g/dL (ref 1.5–4.5)
Glucose: 85 mg/dL (ref 70–99)
Potassium: 4.3 mmol/L (ref 3.5–5.2)
Sodium: 135 mmol/L (ref 134–144)
Total Protein: 6.3 g/dL (ref 6.0–8.5)
eGFR: 63 mL/min/{1.73_m2} (ref >59)

## 2023-01-11 NOTE — Progress Notes (Signed)
Remote pacemaker transmission.   

## 2023-01-17 ENCOUNTER — Telehealth: Payer: Self-pay | Admitting: Cardiology

## 2023-01-17 DIAGNOSIS — I5042 Chronic combined systolic (congestive) and diastolic (congestive) heart failure: Secondary | ICD-10-CM | POA: Diagnosis not present

## 2023-01-17 DIAGNOSIS — R0600 Dyspnea, unspecified: Secondary | ICD-10-CM | POA: Diagnosis not present

## 2023-01-17 DIAGNOSIS — Z515 Encounter for palliative care: Secondary | ICD-10-CM | POA: Diagnosis not present

## 2023-01-17 NOTE — Telephone Encounter (Signed)
Reports swelling in upper abdomen and upper legs. No swelling in lower legs or feet. Reports a little SOB at times. No change in diet or fluid intake. Reports no recent travel. Reports patient always sleeps with HOB elevated. BP 120/70, HR 62 O2 Sat 96%. Weights reported below were confirmed. Medications reviewed. Advised to take the extra 20 mg lasix for a few days to see if symptoms improve and weight decreases. Advised to call back on Friday with an update on symptoms. Advised if she develops worsening symptoms, to go to the ED for an evaluation. Verbalized understanding of plan.

## 2023-01-17 NOTE — Telephone Encounter (Signed)
Pt c/o swelling/edema: STAT if pt has developed SOB within 24 hours  If swelling, where is the swelling located? Stomach   How much weight have you gained and in what time span?  4 lbs in 3 days   Have you gained 2 pounds in a day or 5 pounds in a week?  No   Do you have a log of your daily weights (if so, list)?  08/03 145.5 lbs 08/05 146 lbs 08/06 148.5 lbs 08/07 149.5 lbs  Are you currently taking a fluid pill? Yes  Are you currently SOB? Yes, started 3 days ago   Have you traveled recently in a car or plane for an extended period of time? No

## 2023-01-20 ENCOUNTER — Emergency Department (HOSPITAL_COMMUNITY): Payer: Medicare Other

## 2023-01-20 ENCOUNTER — Other Ambulatory Visit: Payer: Self-pay

## 2023-01-20 ENCOUNTER — Emergency Department (HOSPITAL_COMMUNITY)
Admission: EM | Admit: 2023-01-20 | Payer: Medicare Other | Source: Home / Self Care | Attending: Emergency Medicine | Admitting: Emergency Medicine

## 2023-01-20 ENCOUNTER — Encounter (HOSPITAL_COMMUNITY): Payer: Self-pay

## 2023-01-20 DIAGNOSIS — Z7901 Long term (current) use of anticoagulants: Secondary | ICD-10-CM | POA: Insufficient documentation

## 2023-01-20 DIAGNOSIS — Z96651 Presence of right artificial knee joint: Secondary | ICD-10-CM | POA: Diagnosis not present

## 2023-01-20 DIAGNOSIS — W19XXXA Unspecified fall, initial encounter: Secondary | ICD-10-CM

## 2023-01-20 DIAGNOSIS — M25551 Pain in right hip: Secondary | ICD-10-CM | POA: Diagnosis not present

## 2023-01-20 DIAGNOSIS — J320 Chronic maxillary sinusitis: Secondary | ICD-10-CM | POA: Diagnosis not present

## 2023-01-20 DIAGNOSIS — W07XXXA Fall from chair, initial encounter: Secondary | ICD-10-CM | POA: Insufficient documentation

## 2023-01-20 DIAGNOSIS — M549 Dorsalgia, unspecified: Secondary | ICD-10-CM | POA: Diagnosis not present

## 2023-01-20 DIAGNOSIS — S0990XA Unspecified injury of head, initial encounter: Secondary | ICD-10-CM | POA: Insufficient documentation

## 2023-01-20 DIAGNOSIS — I700 Atherosclerosis of aorta: Secondary | ICD-10-CM | POA: Diagnosis not present

## 2023-01-20 DIAGNOSIS — I517 Cardiomegaly: Secondary | ICD-10-CM | POA: Diagnosis not present

## 2023-01-20 DIAGNOSIS — Z043 Encounter for examination and observation following other accident: Secondary | ICD-10-CM | POA: Diagnosis not present

## 2023-01-20 DIAGNOSIS — Z950 Presence of cardiac pacemaker: Secondary | ICD-10-CM | POA: Diagnosis not present

## 2023-01-20 DIAGNOSIS — R6889 Other general symptoms and signs: Secondary | ICD-10-CM | POA: Diagnosis not present

## 2023-01-20 DIAGNOSIS — S199XXA Unspecified injury of neck, initial encounter: Secondary | ICD-10-CM | POA: Diagnosis not present

## 2023-01-20 DIAGNOSIS — R0902 Hypoxemia: Secondary | ICD-10-CM | POA: Diagnosis not present

## 2023-01-20 DIAGNOSIS — Z743 Need for continuous supervision: Secondary | ICD-10-CM | POA: Diagnosis not present

## 2023-01-20 DIAGNOSIS — Z471 Aftercare following joint replacement surgery: Secondary | ICD-10-CM | POA: Diagnosis not present

## 2023-01-20 LAB — URINALYSIS, ROUTINE W REFLEX MICROSCOPIC
Bilirubin Urine: NEGATIVE
Glucose, UA: NEGATIVE mg/dL
Hgb urine dipstick: NEGATIVE
Ketones, ur: NEGATIVE mg/dL
Nitrite: NEGATIVE
Protein, ur: NEGATIVE mg/dL
Specific Gravity, Urine: 1.015 (ref 1.005–1.030)
pH: 6 (ref 5.0–8.0)

## 2023-01-20 LAB — COMPREHENSIVE METABOLIC PANEL
ALT: 22 U/L (ref 0–44)
AST: 31 U/L (ref 15–41)
Albumin: 3.9 g/dL (ref 3.5–5.0)
Alkaline Phosphatase: 41 U/L (ref 38–126)
Anion gap: 13 (ref 5–15)
BUN: 22 mg/dL (ref 8–23)
CO2: 28 mmol/L (ref 22–32)
Calcium: 9.7 mg/dL (ref 8.9–10.3)
Chloride: 95 mmol/L — ABNORMAL LOW (ref 98–111)
Creatinine, Ser: 1.05 mg/dL — ABNORMAL HIGH (ref 0.44–1.00)
GFR, Estimated: 50 mL/min — ABNORMAL LOW (ref >60)
Glucose, Bld: 126 mg/dL — ABNORMAL HIGH (ref 70–99)
Potassium: 4.6 mmol/L (ref 3.5–5.1)
Sodium: 136 mmol/L (ref 135–145)
Total Bilirubin: 0.6 mg/dL (ref 0.3–1.2)
Total Protein: 6.7 g/dL (ref 6.5–8.1)

## 2023-01-20 LAB — I-STAT CG4 LACTIC ACID, ED: Lactic Acid, Venous: 0.8 mmol/L (ref 0.5–1.9)

## 2023-01-20 LAB — URINALYSIS, MICROSCOPIC (REFLEX): RBC / HPF: NONE SEEN RBC/hpf (ref 0–5)

## 2023-01-20 LAB — SAMPLE TO BLOOD BANK

## 2023-01-20 LAB — I-STAT CHEM 8, ED
BUN: 27 mg/dL — ABNORMAL HIGH (ref 8–23)
Calcium, Ion: 1.14 mmol/L — ABNORMAL LOW (ref 1.15–1.40)
Chloride: 95 mmol/L — ABNORMAL LOW (ref 98–111)
Creatinine, Ser: 1.1 mg/dL — ABNORMAL HIGH (ref 0.44–1.00)
Glucose, Bld: 123 mg/dL — ABNORMAL HIGH (ref 70–99)
HCT: 35 % — ABNORMAL LOW (ref 36.0–46.0)
Hemoglobin: 11.9 g/dL — ABNORMAL LOW (ref 12.0–15.0)
Potassium: 4.3 mmol/L (ref 3.5–5.1)
Sodium: 134 mmol/L — ABNORMAL LOW (ref 135–145)
TCO2: 29 mmol/L (ref 22–32)

## 2023-01-20 LAB — CBC
HCT: 34.3 % — ABNORMAL LOW (ref 36.0–46.0)
Hemoglobin: 11 g/dL — ABNORMAL LOW (ref 12.0–15.0)
MCH: 31.6 pg (ref 26.0–34.0)
MCHC: 32.1 g/dL (ref 30.0–36.0)
MCV: 98.6 fL (ref 80.0–100.0)
Platelets: 149 10*3/uL — ABNORMAL LOW (ref 150–400)
RBC: 3.48 MIL/uL — ABNORMAL LOW (ref 3.87–5.11)
RDW: 20.9 % — ABNORMAL HIGH (ref 11.5–15.5)
WBC: 4.3 10*3/uL (ref 4.0–10.5)
nRBC: 0 % (ref 0.0–0.2)

## 2023-01-20 LAB — PROTIME-INR
INR: 1.5 — ABNORMAL HIGH (ref 0.8–1.2)
Prothrombin Time: 18.8 seconds — ABNORMAL HIGH (ref 11.4–15.2)

## 2023-01-20 NOTE — ED Notes (Signed)
Pt able to walk in the hallway with walker and no other assistance. Pt denies and weakness, dizziness, or pain while walking. RN notified,

## 2023-01-20 NOTE — ED Notes (Addendum)
PA Small at bedside at this time.

## 2023-01-20 NOTE — ED Notes (Signed)
Called CT, notified of Level 2 trauma, CT requested 10 minutes due to both CT's in use.

## 2023-01-20 NOTE — Discharge Instructions (Addendum)
Your CAT scans and x-rays, are reassuring.  As well as your blood work was reassuring.  Please follow-up with your primary care doctor, return to the ER if you have chest pain, shortness of breath, confusion, nausea, vomiting, or severe headache or you start acting abnormally.  You have no evidence of any kind of bleed on your head CT, however if you become more confused, return to the ER immediately.

## 2023-01-20 NOTE — ED Notes (Signed)
Patient transported to CT 

## 2023-01-20 NOTE — ED Provider Notes (Signed)
[place] [facility]PARTMENT AT [facility] Provider Note   CSN: [id number] Arrival date & time: 01/20/23  2110     History  Chief Complaint  Patient presents with   [name] is a 89 y.o. female, history of A-fib on Eliquis, who presents to the ED secondary to a fall that occurred about an hour ago, when she was at home.  She states that she was at home, and she was sitting in her chair, when she fell forward, onto her right side, and hit her head.  She denies any nausea, vomiting, headache.  She states that the right leg, and hip hurt.  Denies any chest pain, shortness of breath, abdominal pain.  Home Medications Prior to Admission medications   Medication Sig Start Date End Date Taking? Authorizing Provider  acetaminophen (TYLENOL) 500 MG tablet Take 1,000 mg by mouth every 6 (six) hours as needed for mild pain.    [provider]  amiodarone (PACERONE) 200 MG tablet TAKE ONE (1) TABLET EACH DAY 12/01/22   Dettinger, Elige Radon, MD  apixaban (ELIQUIS) 5 MG TABS tablet TAKE ONE (1) TABLET BY MOUTH TWO (2) TIMES DAILY 12/12/22   Dettinger, Elige Radon, MD  calcium-vitamin D (OSCAL WITH D) 500-200 MG-UNIT tablet Take 1 tablet by mouth daily with breakfast.     [provider]  ciclopirox (LOPROX) 0.77 % cream Apply topically 2 (two) times daily. 11/27/22   Dettinger, Elige Radon, MD  cyanocobalamin (VITAMIN B12) 1000 MCG/ML injection Inject 1 mL (1,000 mcg total) into the muscle every 30 (thirty) days. 04/10/22   Dettinger, Elige Radon, MD  ezetimibe (ZETIA) 10 MG tablet Take 1 tablet (10 mg total) by mouth daily. 08/08/22   Jonelle Sidle, MD  furosemide (LASIX) 40 MG tablet Take 1.5 tablets (60 mg total) by mouth daily. May take an extra 20 mg daily as needed for weight gain of 2 lbs/day or 5 lbs/week 11/20/22   Jonelle Sidle, MD  Iron, Ferrous Sulfate, 325 (65 Fe) MG TABS Take 325 mg by mouth daily. 11/10/22   Dettinger, Elige Radon, MD   latanoprost (XALATAN) 0.005 % ophthalmic solution Place 1 drop into both eyes at bedtime.    [provider]  megestrol (MEGACE) 40 MG tablet Take 1 tablet (40 mg total) by mouth 2 (two) times daily. With breakfast and lunch 11/09/22   Dettinger, Elige Radon, MD  Multiple Vitamin (MULTIVITAMIN) tablet Take 1 tablet by mouth every morning.    [provider]  nadolol (CORGARD) 40 MG tablet Take 0.5 tablets (20 mg total) by mouth 2 (two) times daily. 11/05/22   Shahmehdi, Gemma Payor, MD  nitroGLYCERIN (NITROSTAT) 0.4 MG SL tablet Place 1 tablet (0.4 mg total) under the tongue every 5 (five) minutes as needed for chest pain. DISSOLVE 1 TAB UNDER TOUNGE FOR CHEST PAIN. MAY REPEAT EVERY 5 MINUTES FOR 3 DOSES. IF NO RELIEF CALL 911 OR GO TO ER Strength: 0.4 mg 11/07/22   Sharlene Dory, NP  nystatin (MYCOSTATIN/NYSTOP) powder Apply 1 Application topically 3 (three) times daily. 11/11/22   Raspet, Noberto Retort, PA-C  omeprazole (PRILOSEC) 20 MG capsule TAKE ONE CAPSULE BY MOUTH DAILY 12/27/22   Dettinger, Elige Radon, MD  spironolactone (ALDACTONE) 25 MG tablet Take 0.5 tablets (12.5 mg total) by mouth daily. 11/20/22   Jonelle Sidle, MD  Wheat Dextrin (BENEFIBER) POWD Take 1 Dose by mouth daily as needed (constipation).    [provider]  Allergies    Naproxen and Iodinated contrast media    Review of Systems   Review of Systems  Physical Exam Updated Vital Signs BP (!) 105/58   Pulse (!) 58   Temp 98 F (36.7 C) (Oral)   Resp 17   Ht 5\' 3"  (1.6 m)   Wt 66.7 kg   SpO2 97%   BMI 26.04 kg/m  Physical Exam Vitals and nursing note reviewed.  Constitutional:      General: She is not in acute distress.    Appearance: She is well-developed.  HENT:     Head: Normocephalic and atraumatic.     Right Ear: Tympanic membrane normal.     Left Ear: Tympanic membrane normal.     Ears:     Comments: No hemotympanum    Nose:     Comments: No septal hematoma. Eyes:      Conjunctiva/sclera: Conjunctivae normal.  Cardiovascular:     Rate and Rhythm: Normal rate and regular rhythm.     Heart sounds: No murmur heard. Pulmonary:     Effort: Pulmonary effort is normal. No respiratory distress.     Breath sounds: Normal breath sounds.  Abdominal:     Palpations: Abdomen is soft.     Tenderness: There is no abdominal tenderness.  Musculoskeletal:        General: No swelling.     Cervical back: Neck supple.     Comments: Tenderness to palpation of right ASIS, as well as lateral mid femur.  Range of motion intact, able to flex, extend legs, as well as abduct adduct hips.  No lumbar, thoracic's tenderness to palpation.  Tenderness to palpation of C-spine.  No step-offs.  Skin:    General: Skin is warm and dry.     Capillary Refill: Capillary refill takes less than 2 seconds.     Comments: No ecchymosis, or wounds noted.  Neurological:     Mental Status: She is alert.  Psychiatric:        Mood and Affect: Mood normal.     ED Results / Procedures / Treatments   Labs (all labs ordered are listed, but only abnormal results are displayed) Labs Reviewed  COMPREHENSIVE METABOLIC PANEL - Abnormal; Notable for the following components:      Result Value   Chloride 95 (*)    Glucose, Bld 126 (*)    Creatinine, Ser 1.05 (*)    GFR, Estimated 50 (*)    All other components within normal limits  CBC - Abnormal; Notable for the following components:   RBC 3.48 (*)    Hemoglobin 11.0 (*)    HCT 34.3 (*)    RDW 20.9 (*)    Platelets 149 (*)    All other components within normal limits  URINALYSIS, ROUTINE W REFLEX MICROSCOPIC - Abnormal; Notable for the following components:   Leukocytes,Ua  (*)    All other components within normal limits  PROTIME-INR - Abnormal; Notable for the following components:   Prothrombin Time 18.8 (*)    INR 1.5 (*)    All other components within normal limits  URINALYSIS, MICROSCOPIC (REFLEX) - Abnormal; Notable for the  following components:   Bacteria, UA RARE (*)    All other components within normal limits  I-STAT CHEM 8, ED - Abnormal; Notable for the following components:   Sodium 134 (*)    Chloride 95 (*)    BUN 27 (*)    Creatinine, Ser 1.10 (*)    Glucose,  Bld 123 (*)    Calcium, Ion 1.14 (*)    Hemoglobin 11.9 (*)    HCT 35.0 (*)    All other components within normal limits  ETHANOL  I-STAT CG4 LACTIC ACID, ED  SAMPLE TO BLOOD BANK    EKG EKG Interpretation Date/Time:  Saturday January 20 2023 21:20:26 EDT Ventricular Rate:  77 PR Interval:  206 QRS Duration:  195 QT Interval:  474 QTC Calculation: 537 R Axis:   -74  Text Interpretation: Paced rhythm LVH with IVCD, LAD and secondary repol abnrm Prolonged QT interval Confirmed by Pricilla Loveless [phone]) on 01/20/2023 10:15:23 PM  Radiology DG Pelvis Portable  Result Date: 01/20/2023 CLINICAL DATA:  Fall EXAM: PORTABLE PELVIS 1-2 VIEWS COMPARISON:  None Available. FINDINGS: No fracture or dislocation is seen. Bilateral hip joint spaces are preserved. Visualized bony pelvis appears intact. IMPRESSION: Negative. Electronically Signed   By: Charline Bills M.D.   On: 01/20/2023 22:26   DG Femur Min 2 Views Right  Result Date: 01/20/2023 CLINICAL DATA:  Fall EXAM: RIGHT FEMUR 2 VIEWS COMPARISON:  None Available. FINDINGS: No fracture or dislocation is seen. Right hip joint space is preserved. Status post right knee arthroplasty, without evidence of complication. IMPRESSION: Negative. Electronically Signed   By: Charline Bills M.D.   On: 01/20/2023 22:25   DG Chest Port 1 View  Result Date: 01/20/2023 CLINICAL DATA:  Fall EXAM: PORTABLE CHEST 1 VIEW COMPARISON:  Chest radiograph dated 11/13/2022. CT chest dated 08/31/2022. FINDINGS: Mildly increased right midlung/perihilar markings, unchanged from most recent chest radiograph, although without correlate on prior CT. Left lung is clear. No frank interstitial edema. No pleural effusion  or pneumothorax. Cardiomegaly.  Right subclavian pacemaker. IMPRESSION: Mildly increased right midlung/perihilar markings, unchanged from recent chest radiograph, but possibly new from prior CT. While this may reflect scarring, consider CT chest for further evaluation as clinically warranted. Electronically Signed   By: Charline Bills M.D.   On: 01/20/2023 22:25   DG Hip Unilat W or Wo Pelvis 2-3 Views Right  Result Date: 01/20/2023 CLINICAL DATA:  Fall EXAM: DG HIP (WITH OR WITHOUT PELVIS) 2-3V RIGHT COMPARISON:  None Available. FINDINGS: No fracture or dislocation is seen. The joint spaces are preserved. The visualized soft tissues are unremarkable. IMPRESSION: Negative. Electronically Signed   By: Charline Bills M.D.   On: 01/20/2023 22:22    Procedures Procedures    Medications Ordered in ED Medications - No data to display  ED Course/ Medical Decision Making/ A&P                                 Medical Decision Making Patient is a 89 year old female, here for fall, from a chair, where she just leaned forward, and hit her head and her side, on the floor.  She complains of some right hip pain, and otherwise is overall well-appearing.  We obtain labs, as well as chest x-ray, pelvis x-ray, femur x-ray, head CT, neck CT for further evaluation.  She has no step-offs, crepitus, ecchymosis of the abdomen, or chest.  No acute neurodeficits.  Denies any chest pain or shortness of breath prior to the fall  Amount and/or Complexity of Data Reviewed Labs: ordered.    Details: Labs are unremarkable Radiology: ordered.    Details: X-ray shows no acute abnormalities.  No evidence of any kind of head bleed on CT, or fractures ECG/medicine tests:  Decision-making details documented in  ED Course. Discussion of management or test interpretation with external provider(s): Discussed with patient, her workup is reassuring.  She was able to ambulate with a walker, with nursing.  At baseline.  I  discussed with the family, on return precautions and they voiced understanding.  Patient is well-appearing okay for discharge.    Final Clinical Impression(s) / ED Diagnoses Final diagnoses:  Fall, initial encounter  Injury of head, initial encounter  Hip pain, right    Rx / DC Orders ED Discharge Orders     None         , Harley Alto, PA 01/20/23 2353    Pricilla Loveless, MD 01/21/23 1459

## 2023-01-20 NOTE — ED Notes (Signed)
Patient ambulated with walker without difficulty.

## 2023-01-20 NOTE — ED Notes (Signed)
Patient returned from CT at this time.

## 2023-01-20 NOTE — Progress Notes (Signed)
Orthopedic Tech Progress Note Patient Details:  [name] 1929-01-09 [id number]  Patient ID: [name], female   DOB: 15-Jun-1928, 89 y.o.   MRN: [id number] I attended trauma page. Trinna Post 01/20/2023, 9:44 PM

## 2023-01-20 NOTE — ED Triage Notes (Signed)
Patient BIB [facility] from home with complaint of fall on thinners.   Per EMS patient fell from chair, approx 3 feet, on eliquis.  Patient unable to tolerate C collar.   No LOC  Reports pain in left hip  20L AC

## 2023-01-23 ENCOUNTER — Other Ambulatory Visit: Payer: Self-pay | Admitting: Family Medicine

## 2023-01-23 ENCOUNTER — Telehealth: Payer: Self-pay | Admitting: Cardiology

## 2023-01-23 NOTE — Telephone Encounter (Signed)
Patient's son Ray informed and verbalized understanding of plan.

## 2023-01-23 NOTE — Telephone Encounter (Signed)
Patient's son called wanting to know if it would be safe for his mother to fly to [place] for a visit and what precautions they would need to take.  Son stated can call his mother back directly.

## 2023-01-29 ENCOUNTER — Ambulatory Visit: Payer: Medicare Other | Admitting: Family

## 2023-01-29 ENCOUNTER — Encounter: Payer: Self-pay | Admitting: Family

## 2023-01-29 VITALS — BP 121/68 | HR 61 | Temp 97.7°F | Ht 63.0 in | Wt 148.4 lb

## 2023-01-29 DIAGNOSIS — I4819 Other persistent atrial fibrillation: Secondary | ICD-10-CM

## 2023-01-29 DIAGNOSIS — Z7901 Long term (current) use of anticoagulants: Secondary | ICD-10-CM | POA: Diagnosis not present

## 2023-01-29 DIAGNOSIS — M25552 Pain in left hip: Secondary | ICD-10-CM

## 2023-01-29 DIAGNOSIS — W19XXXD Unspecified fall, subsequent encounter: Secondary | ICD-10-CM | POA: Diagnosis not present

## 2023-01-29 DIAGNOSIS — S0990XD Unspecified injury of head, subsequent encounter: Secondary | ICD-10-CM

## 2023-01-29 DIAGNOSIS — Y92009 Unspecified place in unspecified non-institutional (private) residence as the place of occurrence of the external cause: Secondary | ICD-10-CM | POA: Diagnosis not present

## 2023-01-29 DIAGNOSIS — Z09 Encounter for follow-up examination after completed treatment for conditions other than malignant neoplasm: Secondary | ICD-10-CM

## 2023-01-29 NOTE — Progress Notes (Signed)
   Subjective:    Patient ID: [name], female    DOB: 11/23/1928, 89 y.o.   MRN: [id number]  Chief Complaint  Patient presents with   Hospitalization Follow-up    HPI Pt presents to the office today for hospital follow up. She went to the ED 01/20/23 after a fall. She was sitting in a chair and fell out of it and hit the back of her head.   She had a negative for acute finding  of a CT cervical, CT head, right hip, right femur, chest, tibia/fibula, and pelvis x-ray.   She reports she is feeling better. Denies any pain or weakness.   She has hx of DVT and A Fib and takes Eliquis.    Review of Systems  All other systems reviewed and are negative.      Objective:   Physical Exam Vitals reviewed.  Constitutional:      General: She is not in acute distress.    Appearance: She is well-developed.  HENT:     Head: Normocephalic and atraumatic.  Eyes:     Pupils: Pupils are equal, round, and reactive to light.  Neck:     Thyroid: No thyromegaly.  Cardiovascular:     Rate and Rhythm: Normal rate and regular rhythm.     Heart sounds: Normal heart sounds. No murmur heard. Pulmonary:     Effort: Pulmonary effort is normal. No respiratory distress.     Breath sounds: Normal breath sounds. No wheezing.  Abdominal:     General: Bowel sounds are normal. There is no distension.     Palpations: Abdomen is soft.     Tenderness: There is no abdominal tenderness.  Musculoskeletal:        General: No tenderness. Normal range of motion.     Cervical back: Normal range of motion and neck supple.     Comments: Full ROM of hip and leg  Skin:    General: Skin is warm and dry.          Comments: Contusion on left scalp   Neurological:     Mental Status: She is alert and oriented to person, place, and time.     Cranial Nerves: No cranial nerve deficit.     Motor: Weakness (using rolling walker) present.     Gait: Gait normal.     Deep Tendon Reflexes: Reflexes are normal and  symmetric.  Psychiatric:        Behavior: Behavior normal.        Thought Content: Thought content normal.        Judgment: Judgment normal.     BP 121/68   Pulse 61   Temp 97.7 F (36.5 C) (Temporal)   Ht 5\' 3"  (1.6 m)   Wt 148 lb 6.4 oz (67.3 kg)   SpO2 92%   BMI 26.29 kg/m      Assessment & Plan:  [place] Den[place] comes in today with chief complaint of Hospitalization Follow-up   Diagnosis and orders addressed:  1. Fall in home, subsequent encounter  2. Injury of head, subsequent encounter  3. Hospital discharge follow-u  4. Pain of left hip  5. Persistent atrial fibrillation (HCC)  6. Chronic anticoagulation   Hospital notes reviewed and scans reviewed Fall preventions discussed  Continue current medications  Keep follow up with PCP  Jannifer Rodney, FNP

## 2023-01-29 NOTE — Patient Instructions (Signed)
Fall Prevention in the Home, Adult Falls can cause injuries and affect people of all ages. There are many simple things that you can do to make your home safe and to help prevent falls. If you need it, ask for help making these changes. What actions can I take to prevent falls? General information Use good lighting in all rooms. Make sure to: Replace any light bulbs that burn out. Turn on lights if it is dark and use night-lights. Keep items that you use often in easy-to-reach places. Lower the shelves around your home if needed. Move furniture so that there are clear paths around it. Do not keep throw rugs or other things on the floor that can make you trip. If any of your floors are uneven, fix them. Add color or contrast paint or tape to clearly mark and help you see: Grab bars or handrails. First and last steps of staircases. Where the edge of each step is. If you use a ladder or stepladder: Make sure that it is fully opened. Do not climb a closed ladder. Make sure the sides of the ladder are locked in place. Have someone hold the ladder while you use it. Know where your pets are as you move through your home. What can I do in the bathroom?     Keep the floor dry. Clean up any water that is on the floor right away. Remove soap buildup in the bathtub or shower. Buildup makes bathtubs and showers slippery. Use non-skid mats or decals on the floor of the bathtub or shower. Attach bath mats securely with double-sided, non-slip rug tape. If you need to sit down while you are in the shower, use a non-slip stool. Install grab bars by the toilet and in the bathtub and shower. Do not use towel bars as grab bars. What can I do in the bedroom? Make sure that you have a light by your bed that is easy to reach. Do not use any sheets or blankets on your bed that hang to the floor. Have a firm bench or chair with side arms that you can use for support when you get dressed. What can I do in  the kitchen? Clean up any spills right away. If you need to reach something above you, use a sturdy step stool that has a grab bar. Keep electrical cables out of the way. Do not use floor polish or wax that makes floors slippery. What can I do with my stairs? Do not leave anything on the stairs. Make sure that you have a light switch at the top and the bottom of the stairs. Have them installed if you do not have them. Make sure that there are handrails on both sides of the stairs. Fix handrails that are broken or loose. Make sure that handrails are as long as the staircases. Install non-slip stair treads on all stairs in your home if they do not have carpet. Avoid having throw rugs at the top or bottom of stairs, or secure the rugs with carpet tape to prevent them from moving. Choose a carpet design that does not hide the edge of steps on the stairs. Make sure that carpet is firmly attached to the stairs. Fix any carpet that is loose or worn. What can I do on the outside of my home? Use bright outdoor lighting. Repair the edges of walkways and driveways and fix any cracks. Clear paths of anything that can make you trip, such as tools or rocks. Add   color or contrast paint or tape to clearly mark and help you see high doorway thresholds. Trim any bushes or trees on the main path into your home. Check that handrails are securely fastened and in good repair. Both sides of all steps should have handrails. Install guardrails along the edges of any raised decks or porches. Have leaves, snow, and ice cleared regularly. Use sand, salt, or ice melt on walkways during winter months if you live where there is ice and snow. In the garage, clean up any spills right away, including grease or oil spills. What other actions can I take? Review your medicines with your health care provider. Some medicines can make you confused or feel dizzy. This can increase your chance of falling. Wear closed-toe shoes that  fit well and support your feet. Wear shoes that have rubber soles and low heels. Use a cane, walker, scooter, or crutches that help you move around if needed. Talk with your provider about other ways that you can decrease your risk of falls. This may include seeing a physical therapist to learn to do exercises to improve movement and strength. Where to find more information Centers for Disease Control and Prevention, [employer]: cdc.gov National Institute on Aging: nia.nih.gov National Institute on Aging: nia.nih.gov Contact a health care provider if: You are afraid of falling at home. You feel weak, drowsy, or dizzy at home. You fall at home. Get help right away if you: Lose consciousness or have trouble moving after a fall. Have a fall that causes a head injury. These symptoms may be an emergency. Get help right away. Call 911. Do not wait to see if the symptoms will go away. Do not drive yourself to the hospital. This information is not intended to replace advice given to you by your health care provider. Make sure you discuss any questions you have with your health care provider. Document Revised: 01/30/2022 Document Reviewed: 01/30/2022 Elsevier Patient Education  2024 Elsevier Inc.  

## 2023-01-30 ENCOUNTER — Ambulatory Visit (INDEPENDENT_AMBULATORY_CARE_PROVIDER_SITE_OTHER): Payer: Medicare Other

## 2023-01-30 ENCOUNTER — Telehealth: Payer: Self-pay

## 2023-01-30 VITALS — Ht 63.0 in | Wt 148.0 lb

## 2023-01-30 DIAGNOSIS — Z0000 Encounter for general adult medical examination without abnormal findings: Secondary | ICD-10-CM

## 2023-01-30 NOTE — Transitions of Care (Post Inpatient/ED Visit) (Signed)
   01/30/2023  Name: [name] MRN: [id number] DOB: 02/17/1929  Today's TOC FU Call Status: Today's TOC FU Call Status:: Successful TOC FU Call Completed TOC FU Call Complete Date: 01/30/23  Transition Care Management Follow-up Telephone Call Date of Discharge: 01/20/23 Discharge Facility: [facility] Type of Discharge: Emergency Department Reason for ED Visit: Other: (Fall-hip pain and hit her head) How have you been since you were released from the hospital?: Better (Patient notes she is still a bit sore) Any questions or concerns?: No  Items Reviewed: Did you receive and understand the discharge instructions provided?: Yes Medications obtained,verified, and reconciled?: No Medications Not Reviewed Reasons:: Other: (EMMI Call) Any new allergies since your discharge?: No Dietary orders reviewed?: No Do you have support at home?: Yes People in Home: child(ren), adult Name of Support/Comfort Primary Source: Randy  Medications Reviewed Today: Medications Reviewed Today   Medications were not reviewed in this encounter     Home Care and Equipment/Supplies: Were Home Health Services Ordered?: No Any new equipment or medical supplies ordered?: No  Functional Questionnaire: Do you need assistance with bathing/showering or dressing?: No Do you need assistance with meal preparation?: No Do you need assistance with eating?: No Do you have difficulty maintaining continence: No Do you need assistance with getting out of bed/getting out of a chair/moving?: No Do you have difficulty managing or taking your medications?: No  Follow up appointments reviewed: PCP Follow-up appointment confirmed?: Yes Date of PCP follow-up appointment?: 01/29/23 Follow-up Provider: Merry Proud, NP Specialist Hospital Follow-up appointment confirmed?: NA Do you need transportation to your follow-up appointment?: No Do you understand care options if your condition(s) worsen?: Yes-patient  verbalized understanding  SDOH Interventions Today    Flowsheet Row Most Recent Value  SDOH Interventions   Food Insecurity Interventions Intervention Not Indicated  Housing Interventions Intervention Not Indicated      Jodelle Gross, RN, BSN, CCM Care Management Coordinator [facility] Phone: [phone]/Fax: [phone]

## 2023-01-30 NOTE — Patient Instructions (Signed)
[name] , Thank you for taking time to come for your Medicare Wellness Visit. I appreciate your ongoing commitment to your health goals. Please review the following plan we discussed and let me know if I can assist you in the future.   Referrals/Orders/Follow-Ups/Clinician Recommendations: Aim for 30 minutes of exercise or brisk walking, 6-8 glasses of water, and 5 servings of fruits and vegetables each day.   This is a list of the screening recommended for you and due dates:  Health Maintenance  Topic Date Due   COVID-19 Vaccine (3 - Moderna risk series) 10/06/2019   Mammogram  10/28/2022   Flu Shot  09/10/2023*   Zoster (Shingles) Vaccine (1 of 2) 09/17/2023*   Medicare Annual Wellness Visit  01/30/2024   DTaP/Tdap/Td vaccine (3 - Td or Tdap) 08/10/2029   Pneumonia Vaccine  Completed   DEXA scan (bone density measurement)  Completed   HPV Vaccine  Aged Out  *Topic was postponed. The date shown is not the original due date.    Advanced directives: (Copy Requested) Please bring a copy of your health care power of attorney and living will to the office to be added to your chart at your convenience.  Next Medicare Annual Wellness Visit scheduled for next year: Yes  Preventive Care 19 Years and Older, Female Preventive care refers to lifestyle choices and visits with your health care provider that can promote health and wellness. What does preventive care include? A yearly physical exam. This is also called an annual well check. Dental exams once or twice a year. Routine eye exams. Ask your health care provider how often you should have your eyes checked. Personal lifestyle choices, including: Daily care of your teeth and gums. Regular physical activity. Eating a healthy diet. Avoiding tobacco and drug use. Limiting alcohol use. Practicing safe sex. Taking low-dose aspirin every day. Taking vitamin and mineral supplements as recommended by your health care provider. What  happens during an annual well check? The services and screenings done by your health care provider during your annual well check will depend on your age, overall health, lifestyle risk factors, and family history of disease. Counseling  Your health care provider may ask you questions about your: Alcohol use. Tobacco use. Drug use. Emotional well-being. Home and relationship well-being. Sexual activity. Eating habits. History of falls. Memory and ability to understand (cognition). Work and work Astronomer. Reproductive health. Screening  You may have the following tests or measurements: Height, weight, and BMI. Blood pressure. Lipid and cholesterol levels. These may be checked every 5 years, or more frequently if you are over 55 years old. Skin check. Lung cancer screening. You may have this screening every year starting at age 87 if you have a 30-pack-year history of smoking and currently smoke or have quit within the past 15 years. Fecal occult blood test (FOBT) of the stool. You may have this test every year starting at age 87. Flexible sigmoidoscopy or colonoscopy. You may have a sigmoidoscopy every 5 years or a colonoscopy every 10 years starting at age 90. Hepatitis C blood test. Hepatitis B blood test. Sexually transmitted disease (STD) testing. Diabetes screening. This is done by checking your blood sugar (glucose) after you have not eaten for a while (fasting). You may have this done every 1-3 years. Bone density scan. This is done to screen for osteoporosis. You may have this done starting at age 87. Mammogram. This may be done every 1-2 years. Talk to your health care provider about  how often you should have regular mammograms. Talk with your health care provider about your test results, treatment options, and if necessary, the need for more tests. Vaccines  Your health care provider may recommend certain vaccines, such as: Influenza vaccine. This is recommended every  year. Tetanus, diphtheria, and acellular pertussis (Tdap, Td) vaccine. You may need a Td booster every 10 years. Zoster vaccine. You may need this after age 87. Pneumococcal 13-valent conjugate (PCV13) vaccine. One dose is recommended after age 20. Pneumococcal polysaccharide (PPSV23) vaccine. One dose is recommended after age 22. Talk to your health care provider about which screenings and vaccines you need and how often you need them. This information is not intended to replace advice given to you by your health care provider. Make sure you discuss any questions you have with your health care provider. Document Released: 06/25/2015 Document Revised: 02/16/2016 Document Reviewed: 03/30/2015 Elsevier Interactive Patient Education  2017 [employer].  Fall Prevention in the Home Falls can cause injuries. They can happen to people of all ages. There are many things you can do to make your home safe and to help prevent falls. What can I do on the outside of my home? Regularly fix the edges of walkways and driveways and fix any cracks. Remove anything that might make you trip as you walk through a door, such as a raised step or threshold. Trim any bushes or trees on the path to your home. Use bright outdoor lighting. Clear any walking paths of anything that might make someone trip, such as rocks or tools. Regularly check to see if handrails are loose or broken. Make sure that both sides of any steps have handrails. Any raised decks and porches should have guardrails on the edges. Have any leaves, snow, or ice cleared regularly. Use sand or salt on walking paths during winter. Clean up any spills in your garage right away. This includes oil or grease spills. What can I do in the bathroom? Use night lights. Install grab bars by the toilet and in the tub and shower. Do not use towel bars as grab bars. Use non-skid mats or decals in the tub or shower. If you need to sit down in the shower, use a  plastic, non-slip stool. Keep the floor dry. Clean up any water that spills on the floor as soon as it happens. Remove soap buildup in the tub or shower regularly. Attach bath mats securely with double-sided non-slip rug tape. Do not have throw rugs and other things on the floor that can make you trip. What can I do in the bedroom? Use night lights. Make sure that you have a light by your bed that is easy to reach. Do not use any sheets or blankets that are too big for your bed. They should not hang down onto the floor. Have a firm chair that has side arms. You can use this for support while you get dressed. Do not have throw rugs and other things on the floor that can make you trip. What can I do in the kitchen? Clean up any spills right away. Avoid walking on wet floors. Keep items that you use a lot in easy-to-reach places. If you need to reach something above you, use a strong step stool that has a grab bar. Keep electrical cords out of the way. Do not use floor polish or wax that makes floors slippery. If you must use wax, use non-skid floor wax. Do not have throw rugs and other  things on the floor that can make you trip. What can I do with my stairs? Do not leave any items on the stairs. Make sure that there are handrails on both sides of the stairs and use them. Fix handrails that are broken or loose. Make sure that handrails are as long as the stairways. Check any carpeting to make sure that it is firmly attached to the stairs. Fix any carpet that is loose or worn. Avoid having throw rugs at the top or bottom of the stairs. If you do have throw rugs, attach them to the floor with carpet tape. Make sure that you have a light switch at the top of the stairs and the bottom of the stairs. If you do not have them, ask someone to add them for you. What else can I do to help prevent falls? Wear shoes that: Do not have high heels. Have rubber bottoms. Are comfortable and fit you  well. Are closed at the toe. Do not wear sandals. If you use a stepladder: Make sure that it is fully opened. Do not climb a closed stepladder. Make sure that both sides of the stepladder are locked into place. Ask someone to hold it for you, if possible. Clearly mark and make sure that you can see: Any grab bars or handrails. First and last steps. Where the edge of each step is. Use tools that help you move around (mobility aids) if they are needed. These include: Canes. Walkers. Scooters. Crutches. Turn on the lights when you go into a dark area. Replace any light bulbs as soon as they burn out. Set up your furniture so you have a clear path. Avoid moving your furniture around. If any of your floors are uneven, fix them. If there are any pets around you, be aware of where they are. Review your medicines with your doctor. Some medicines can make you feel dizzy. This can increase your chance of falling. Ask your doctor what other things that you can do to help prevent falls. This information is not intended to replace advice given to you by your health care provider. Make sure you discuss any questions you have with your health care provider. Document Released: 03/25/2009 Document Revised: 11/04/2015 Document Reviewed: 07/03/2014 Elsevier Interactive Patient Education  2017 [employer].

## 2023-01-30 NOTE — Progress Notes (Signed)
Subjective:   [name] is a 89 y.o. female who presents for Medicare Annual (Subsequent) preventive examination.  Visit Complete: Virtual  I connected with  [name] on 01/30/23 by a audio enabled telemedicine application and verified that I am speaking with the correct person using two identifiers.  Patient Location: Home  Provider Location: Home Office  I discussed the limitations of evaluation and management by telemedicine. The patient expressed understanding and agreed to proceed.  Patient Medicare AWV questionnaire was completed by the patient on 01/30/2023; I have confirmed that all information answered by patient is correct and no changes since this date.  Review of Systems    Vital Signs: Unable to obtain new vitals due to this being a telehealth visit.        Objective:    Today's Vitals   01/30/23 1117  Weight: 148 lb (67.1 kg)  Height: 5\' 3"  (1.6 m)   Body mass index is 26.22 kg/m.     01/20/2023    9:21 PM 11/02/2022    4:22 PM 08/31/2022    1:14 PM 05/11/2022    5:43 PM 04/13/2022    2:03 AM 02/25/2022   12:49 PM 01/26/2022   12:00 PM  Advanced Directives  Does Patient Have a Medical Advance Directive? No No No No No No No  Would patient like information on creating a medical advance directive?  No - Patient declined   No - Patient declined  No - Patient declined    Current Medications (verified) Outpatient Encounter Medications as of 01/30/2023  Medication Sig   acetaminophen (TYLENOL) 500 MG tablet Take 1,000 mg by mouth every 6 (six) hours as needed for mild pain.   amiodarone (PACERONE) 200 MG tablet TAKE ONE (1) TABLET EACH DAY   apixaban (ELIQUIS) 5 MG TABS tablet TAKE ONE (1) TABLET BY MOUTH TWO (2) TIMES DAILY   calcium-vitamin D (OSCAL WITH D) 500-200 MG-UNIT tablet Take 1 tablet by mouth daily with breakfast.    ciclopirox (LOPROX) 0.77 % cream Apply topically 2 (two) times daily.   cyanocobalamin (VITAMIN B12) 1000 MCG/ML  injection Inject 1 mL (1,000 mcg total) into the muscle every 30 (thirty) days.   ezetimibe (ZETIA) 10 MG tablet Take 1 tablet (10 mg total) by mouth daily.   furosemide (LASIX) 40 MG tablet Take 1.5 tablets (60 mg total) by mouth daily. May take an extra 20 mg daily as needed for weight gain of 2 lbs/day or 5 lbs/week   Iron, Ferrous Sulfate, 325 (65 Fe) MG TABS Take 325 mg by mouth daily.   latanoprost (XALATAN) 0.005 % ophthalmic solution Place 1 drop into both eyes at bedtime.   megestrol (MEGACE) 40 MG tablet Take 1 tablet (40 mg total) by mouth 2 (two) times daily. With breakfast and lunch   Multiple Vitamin (MULTIVITAMIN) tablet Take 1 tablet by mouth every morning.   nadolol (CORGARD) 40 MG tablet Take 0.5 tablets (20 mg total) by mouth 2 (two) times daily.   nitroGLYCERIN (NITROSTAT) 0.4 MG SL tablet Place 1 tablet (0.4 mg total) under the tongue every 5 (five) minutes as needed for chest pain. DISSOLVE 1 TAB UNDER TOUNGE FOR CHEST PAIN. MAY REPEAT EVERY 5 MINUTES FOR 3 DOSES. IF NO RELIEF CALL 911 OR GO TO ER Strength: 0.4 mg   nystatin (MYCOSTATIN/NYSTOP) powder Apply 1 Application topically 3 (three) times daily.   omeprazole (PRILOSEC) 20 MG capsule TAKE ONE CAPSULE BY MOUTH DAILY   spironolactone (ALDACTONE) 25  MG tablet Take 0.5 tablets (12.5 mg total) by mouth daily.   Wheat Dextrin (BENEFIBER) POWD Take 1 Dose by mouth daily as needed (constipation).   Facility-Administered Encounter Medications as of 01/30/2023  Medication   bupivacaine liposome (EXPAREL) 1.3 % injection 266 mg    Allergies (verified) Naproxen and Iodinated contrast media   History: Past Medical History:  Diagnosis Date   Arthritis    Atrial flutter (HCC)    s/p RFA 2003   Breast cancer (HCC)    1980's - lumpectomy only   Colon polyps    Diverticulosis    DVT, lower extremity (HCC) 1998 and 1999   Bilateral   Essential hypertension    Gallstones    GERD (gastroesophageal reflux disease)     Glaucoma    IBS (irritable bowel syndrome)    Mixed hyperlipidemia    Osteoporosis    Persistent atrial fibrillation (HCC)    Pneumonia 2011   Sick sinus syndrome (HCC)    PPM (SJM)   Urinary tract infection    Varicose veins    Past Surgical History:  Procedure Laterality Date   BIOPSY  07/04/2021   Procedure: BIOPSY;  Surgeon: Tressia Danas, MD;  Location: [facility] ENDOSCOPY;  Service: Gastroenterology;;   BREAST LUMPECTOMY  [phone]   Right breast    CARDIAC CATHETERIZATION  1999   Normal coronaries   Cataracts Bilateral    CHOLECYSTECTOMY     COLONOSCOPY W/ BIOPSIES AND POLYPECTOMY  03/2003, 05/2008, 02/20/2011   severe diverticulosis, tubulovillous adenoma polyp, internal and external hemorrhoids 2012: severe diverticulosis, 4 mm polyp, hemorrhoids   CYST REMOVAL TRUNK     under breast = on belly    DILATION AND CURETTAGE OF UTERUS     ESOPHAGOGASTRODUODENOSCOPY (EGD) WITH PROPOFOL N/A 07/04/2021   Procedure: ESOPHAGOGASTRODUODENOSCOPY (EGD) WITH PROPOFOL;  Surgeon: Tressia Danas, MD;  Location: [facility] ENDOSCOPY;  Service: Gastroenterology;  Laterality: N/A;   EYE SURGERY     cataracts - bilateral    Fractured wrist Left 2013   repair   INSERT / REPLACE / REMOVE PACEMAKER     KNEE ARTHROSCOPY WITH MEDIAL MENISECTOMY Right 10/21/2014   Procedure: RIGHT KNEE ARTHROSCOPY WITH MEDIAL MENISECTOM,lateral menisectomy,synovectomy suprapatellar pouch;  Surgeon: Ranee Gosselin, MD;  Location: [facility] ORS;  Service: Orthopedics;  Laterality: Right;   PACEMAKER GENERATOR CHANGE  06/18/12   SJM [facility] DR RF, Dr Johney Frame   PACEMAKER INSERTION  2003   PERMANENT PACEMAKER GENERATOR CHANGE N/A 06/18/2012   Procedure: PERMANENT PACEMAKER GENERATOR CHANGE;  Surgeon: Hillis Range, MD;  Location: [facility] CATH LAB;  Service: Cardiovascular;  Laterality: N/A;   PPM GENERATOR CHANGEOUT N/A 09/30/2021   Procedure: PPM GENERATOR CHANGEOUT;  Surgeon: Hillis Range, MD;  Location: [facility] INVASIVE CV LAB;  Service:  Cardiovascular;  Laterality: N/A;   TOTAL KNEE ARTHROPLASTY  12/20/2011   Procedure: TOTAL KNEE ARTHROPLASTY;  Surgeon: Jacki Cones, MD;  Location: [facility] ORS;  Service: Orthopedics;  Laterality: Left;   TOTAL KNEE ARTHROPLASTY Right 02/10/2015   Procedure: TOTAL RIGHT KNEE ARTHROPLASTY;  Surgeon: Ranee Gosselin, MD;  Location: [facility] ORS;  Service: Orthopedics;  Laterality: Right;   Family History  Problem Relation Age of Onset   Throat cancer Father    Stroke Mother    Alzheimer's disease Mother    Alzheimer's disease Brother    Alzheimer's disease Sister    Colon cancer Maternal Grandfather    Stomach cancer Maternal Grandmother    Heart disease Paternal Grandmother    Heart attack Paternal  Grandmother    Prostate cancer Son    Colon cancer Sister    Colon cancer Brother    Heart disease Brother    Atrial fibrillation Brother    Diabetes Brother    Dementia Brother    Parkinson's disease Brother    Prostate cancer Son    Social History   Socioeconomic History   Marital status: Widowed    Spouse name: Not on file   Number of children: 5   Years of education: 11   Highest education level: 12th grade  Occupational History   Occupation: Retired    Comment: resturant / cafe   Tobacco Use   Smoking status: Never   Smokeless tobacco: Never  Vaping Use   Vaping status: Never Used  Substance and Sexual Activity   Alcohol use: No    Alcohol/week: 0.0 standard drinks of alcohol   Drug use: No   Sexual activity: Not on file  Other Topics Concern   Not on file  Social History Narrative   Widowed  - 5 sons = 1 lives local    Retired.   Never smoker no alcohol   Social Determinants of Health   Financial Resource Strain: Low Risk  (01/30/2023)   Overall Financial Resource Strain (CARDIA)    Difficulty of Paying Living Expenses: Not hard at all  Food Insecurity: No Food Insecurity (01/30/2023)   Hunger Vital Sign    Worried About Running Out of Food in the Last Year: Never  true    Ran Out of Food in the Last Year: Never true  Transportation Needs: No Transportation Needs (01/30/2023)   PRAPARE - Administrator, Civil Service (Medical): No    Lack of Transportation (Non-Medical): No  Physical Activity: Inactive (01/30/2023)   Exercise Vital Sign    Days of Exercise per Week: 0 days    Minutes of Exercise per Session: 0 min  Stress: No Stress Concern Present (01/30/2023)   [employer] of Occupational Health - Occupational Stress Questionnaire    Feeling of Stress : Not at all  Social Connections: Moderately Isolated (01/30/2023)   Social Connection and Isolation Panel [NHANES]    Frequency of Communication with Friends and Family: More than three times a week    Frequency of Social Gatherings with Friends and Family: More than three times a week    Attends Religious Services: More than 4 times per year    Active Member of [employer] or Organizations: No    Attends Banker Meetings: Never    Marital Status: Widowed    Tobacco Counseling Counseling given: Not Answered   Clinical Intake:  Pre-visit preparation completed: Yes  Pain : No/denies pain     Nutritional Risks: None Diabetes: No  How often do you need to have someone help you when you read instructions, pamphlets, or other written materials from your doctor or pharmacy?: 1 - Never  Interpreter Needed?: No  Information entered by :: Renie Ora, LPN   Activities of Daily Living    11/02/2022    8:00 PM  In your present state of health, do you have any difficulty performing the following activities:  Hearing? 0  Vision? 0  Difficulty concentrating or making decisions? 0  Walking or climbing stairs? 1  Dressing or bathing? 0  Doing errands, shopping? 0    Patient Care Team: Dettinger, Elige Radon, MD as PCP - General (Family Medicine) Hillis Range, MD (Inactive) as PCP - Electrophysiology (Cardiology) Nona Dell  Reece Agar, MD as PCP - Cardiology  (Cardiology) Michaelle Copas, MD as Referring Physician (Optometry) Adam Phenix, DPM as Consulting Physician (Podiatry) Iva Boop, MD as Consulting Physician (Gastroenterology)  Indicate any recent Medical Services you may have received from other than [facility] providers in the past year (date may be approximate).     Assessment:   This is a routine wellness examination for [name].  Hearing/Vision screen Vision Screening - Comments:: Wears rx glasses - up to date with routine eye exams with  Dr.Le   Dietary issues and exercise activities discussed:     Goals Addressed   None    Depression Screen    01/30/2023   11:19 AM 01/10/2023   11:00 AM 11/27/2022   10:46 AM 11/09/2022    4:24 PM 10/12/2022   10:59 AM 09/15/2022    2:33 PM 08/24/2022   11:16 AM  PHQ 2/9 Scores  PHQ - 2 Score 0 3 3 1  0 1 0  PHQ- 9 Score 0 11 11 10 5 9 4     Fall Risk    01/30/2023   11:18 AM 01/10/2023   10:59 AM 11/27/2022   10:46 AM 11/09/2022    4:24 PM 10/12/2022   10:57 AM  Fall Risk   Falls in the past year? 1 1 1 1 1   Number falls in past yr: 1 1 1 1 1   Injury with Fall? 1 1 1 1 1   Risk for fall due to : History of fall(s);Impaired balance/gait;Orthopedic patient Impaired balance/gait;Impaired mobility Impaired balance/gait;Impaired mobility Impaired balance/gait;Impaired mobility Impaired balance/gait;Impaired mobility  Follow up Education provided;Falls prevention discussed;Falls evaluation completed Falls evaluation completed Falls evaluation completed Falls evaluation completed;Education provided Falls evaluation completed;Education provided    MEDICARE RISK AT HOME: Medicare Risk at Home Any stairs in or around the home?: Yes If so, are there any without handrails?: No Home free of loose throw rugs in walkways, pet beds, electrical cords, etc?: Yes Adequate lighting in your home to reduce risk of falls?: Yes Life alert?: No Use of a cane, walker or w/c?: No Grab bars in the bathroom?:  Yes Shower chair or bench in shower?: Yes Elevated toilet seat or a handicapped toilet?: Yes  TIMED UP AND GO:  Was the test performed?  No    Cognitive Function:        01/26/2022   12:02 PM 01/12/2021    9:56 AM 01/12/2020   10:10 AM 12/24/2018    9:43 AM  6CIT Screen  What Year? 0 points 0 points 0 points 0 points  What month? 0 points 0 points 0 points 0 points  What time? 0 points 0 points 0 points 0 points  Count back from 20 0 points 0 points 0 points 0 points  Months in reverse 0 points 0 points 0 points 0 points  Repeat phrase 0 points 0 points 2 points 0 points  Total Score 0 points 0 points 2 points 0 points    Immunizations Immunization History  Administered Date(s) Administered   Fluad Quad(high Dose 65+) 03/18/2019, 03/08/2021   Influenza, High Dose Seasonal PF 04/02/2015, 04/25/2017, 03/13/2018   Influenza,trivalent, recombinat, inj, PF 03/13/2014   Moderna Sars-Covid-2 Vaccination 08/11/2019, 09/08/2019   Pneumococcal Conjugate-13 04/02/2015   Pneumococcal Polysaccharide-23 07/22/2007, 08/16/2016   Td 08/11/2019   Tdap 01/26/2010    TDAP status: Due, Education has been provided regarding the importance of this vaccine. Advised may receive this vaccine at local pharmacy or Health Dept. Aware to  provide a copy of the vaccination record if obtained from local pharmacy or Health Dept. Verbalized acceptance and understanding.  Flu Vaccine status: Due, Education has been provided regarding the importance of this vaccine. Advised may receive this vaccine at local pharmacy or Health Dept. Aware to provide a copy of the vaccination record if obtained from local pharmacy or Health Dept. Verbalized acceptance and understanding.  Pneumococcal vaccine status: Up to date  Covid-19 vaccine status: Completed vaccines  Qualifies for Shingles Vaccine? Yes   Zostavax completed No   Shingrix Completed?: No.    Education has been provided regarding the importance of this  vaccine. Patient has been advised to call insurance company to determine out of pocket expense if they have not yet received this vaccine. Advised may also receive vaccine at local pharmacy or Health Dept. Verbalized acceptance and understanding.  Screening Tests Health Maintenance  Topic Date Due   COVID-19 Vaccine (3 - Moderna risk series) 10/06/2019   MAMMOGRAM  10/28/2022   INFLUENZA VACCINE  09/10/2023 (Originally 01/11/2023)   Zoster Vaccines- Shingrix (1 of 2) 09/17/2023 (Originally 05/17/1948)   Medicare Annual Wellness (AWV)  01/30/2024   DTaP/Tdap/Td (3 - Td or Tdap) 08/10/2029   Pneumonia Vaccine 50+ Years old  Completed   DEXA SCAN  Completed   HPV VACCINES  Aged Out    Health Maintenance  Health Maintenance Due  Topic Date Due   COVID-19 Vaccine (3 - Moderna risk series) 10/06/2019   MAMMOGRAM  10/28/2022    Colorectal cancer screening: No longer required.   Mammogram status: No longer required due to age.  Bone Density status: Ordered 06/19/2019. Pt provided with contact info and advised to call to schedule appt.  Lung Cancer Screening: (Low Dose CT Chest recommended if Age 70-80 years, 20 pack-year currently smoking OR have quit w/in 15years.) does not qualify.   Lung Cancer Screening Referral: n/a  Additional Screening:  Hepatitis C Screening: does not qualify;  Vision Screening: Recommended annual ophthalmology exams for early detection of glaucoma and other disorders of the eye. Is the patient up to date with their annual eye exam?  Yes  Who is the provider or what is the name of the office in which the patient attends annual eye exams? Dr.Le  If pt is not established with a provider, would they like to be referred to a provider to establish care? No .   Dental Screening: Recommended annual dental exams for proper oral hygiene    Community Resource Referral / Chronic Care Management: CRR required this visit?  No   CCM required this visit?  No      Plan:     I have personally reviewed and noted the following in the patient's chart:   Medical and social history Use of alcohol, tobacco or illicit drugs  Current medications and supplements including opioid prescriptions. Patient is not currently taking opioid prescriptions. Functional ability and status Nutritional status Physical activity Advanced directives List of other physicians Hospitalizations, surgeries, and ER visits in previous 12 months Vitals Screenings to include cognitive, depression, and falls Referrals and appointments  In addition, I have reviewed and discussed with patient certain preventive protocols, quality metrics, and best practice recommendations. A written personalized care plan for preventive services as well as general preventive health recommendations were provided to patient.     Lorrene Reid, LPN   1/61/0960   After Visit Summary: (MyChart) Due to this being a telephonic visit, the after visit summary with patients personalized  plan was offered to patient via MyChart   Nurse Notes: Due Tdap Vaccine

## 2023-01-31 DIAGNOSIS — R0902 Hypoxemia: Secondary | ICD-10-CM | POA: Diagnosis not present

## 2023-01-31 DIAGNOSIS — I5043 Acute on chronic combined systolic (congestive) and diastolic (congestive) heart failure: Secondary | ICD-10-CM | POA: Diagnosis not present

## 2023-02-13 ENCOUNTER — Ambulatory Visit: Payer: Medicare Other | Admitting: Cardiology

## 2023-02-13 ENCOUNTER — Other Ambulatory Visit: Payer: Self-pay | Admitting: Cardiology

## 2023-02-14 ENCOUNTER — Emergency Department (HOSPITAL_COMMUNITY): Payer: Medicare Other

## 2023-02-14 ENCOUNTER — Emergency Department (HOSPITAL_COMMUNITY)
Admission: EM | Admit: 2023-02-14 | Discharge: 2023-02-14 | Disposition: A | Discharge status: Home / Self Care | Payer: Medicare Other | Attending: Student | Admitting: Student

## 2023-02-14 ENCOUNTER — Encounter (HOSPITAL_COMMUNITY): Payer: Self-pay | Admitting: *Deleted

## 2023-02-14 ENCOUNTER — Other Ambulatory Visit: Payer: Self-pay

## 2023-02-14 DIAGNOSIS — E871 Hypo-osmolality and hyponatremia: Secondary | ICD-10-CM | POA: Diagnosis not present

## 2023-02-14 DIAGNOSIS — I517 Cardiomegaly: Secondary | ICD-10-CM | POA: Diagnosis not present

## 2023-02-14 DIAGNOSIS — L03115 Cellulitis of right lower limb: Secondary | ICD-10-CM | POA: Insufficient documentation

## 2023-02-14 DIAGNOSIS — I6523 Occlusion and stenosis of bilateral carotid arteries: Secondary | ICD-10-CM | POA: Diagnosis not present

## 2023-02-14 DIAGNOSIS — R188 Other ascites: Secondary | ICD-10-CM | POA: Diagnosis not present

## 2023-02-14 DIAGNOSIS — Z043 Encounter for examination and observation following other accident: Secondary | ICD-10-CM | POA: Diagnosis not present

## 2023-02-14 DIAGNOSIS — I110 Hypertensive heart disease with heart failure: Secondary | ICD-10-CM | POA: Diagnosis not present

## 2023-02-14 DIAGNOSIS — W19XXXA Unspecified fall, initial encounter: Secondary | ICD-10-CM | POA: Diagnosis not present

## 2023-02-14 DIAGNOSIS — Z96651 Presence of right artificial knee joint: Secondary | ICD-10-CM | POA: Insufficient documentation

## 2023-02-14 DIAGNOSIS — M79661 Pain in right lower leg: Secondary | ICD-10-CM | POA: Insufficient documentation

## 2023-02-14 DIAGNOSIS — K7689 Other specified diseases of liver: Secondary | ICD-10-CM | POA: Diagnosis not present

## 2023-02-14 DIAGNOSIS — S0990XA Unspecified injury of head, initial encounter: Secondary | ICD-10-CM | POA: Diagnosis not present

## 2023-02-14 DIAGNOSIS — Z950 Presence of cardiac pacemaker: Secondary | ICD-10-CM | POA: Insufficient documentation

## 2023-02-14 DIAGNOSIS — M542 Cervicalgia: Secondary | ICD-10-CM | POA: Diagnosis not present

## 2023-02-14 DIAGNOSIS — M25551 Pain in right hip: Secondary | ICD-10-CM | POA: Diagnosis not present

## 2023-02-14 DIAGNOSIS — R6889 Other general symptoms and signs: Secondary | ICD-10-CM | POA: Diagnosis not present

## 2023-02-14 DIAGNOSIS — S51011A Laceration without foreign body of right elbow, initial encounter: Secondary | ICD-10-CM | POA: Diagnosis not present

## 2023-02-14 DIAGNOSIS — Z743 Need for continuous supervision: Secondary | ICD-10-CM | POA: Diagnosis not present

## 2023-02-14 DIAGNOSIS — S59901A Unspecified injury of right elbow, initial encounter: Secondary | ICD-10-CM | POA: Diagnosis present

## 2023-02-14 DIAGNOSIS — Z7901 Long term (current) use of anticoagulants: Secondary | ICD-10-CM | POA: Insufficient documentation

## 2023-02-14 DIAGNOSIS — I509 Heart failure, unspecified: Secondary | ICD-10-CM | POA: Insufficient documentation

## 2023-02-14 DIAGNOSIS — Z853 Personal history of malignant neoplasm of breast: Secondary | ICD-10-CM | POA: Insufficient documentation

## 2023-02-14 DIAGNOSIS — R109 Unspecified abdominal pain: Secondary | ICD-10-CM | POA: Diagnosis not present

## 2023-02-14 DIAGNOSIS — R519 Headache, unspecified: Secondary | ICD-10-CM | POA: Diagnosis not present

## 2023-02-14 DIAGNOSIS — R58 Hemorrhage, not elsewhere classified: Secondary | ICD-10-CM | POA: Diagnosis not present

## 2023-02-14 DIAGNOSIS — S3991XA Unspecified injury of abdomen, initial encounter: Secondary | ICD-10-CM | POA: Diagnosis not present

## 2023-02-14 HISTORY — DX: Heart failure, unspecified: I50.9

## 2023-02-14 HISTORY — DX: Presence of cardiac pacemaker: Z95.0

## 2023-02-14 LAB — I-STAT CHEM 8, ED
BUN: 22 mg/dL (ref 8–23)
Calcium, Ion: 1.11 mmol/L — ABNORMAL LOW (ref 1.15–1.40)
Chloride: 91 mmol/L — ABNORMAL LOW (ref 98–111)
Creatinine, Ser: 0.8 mg/dL (ref 0.44–1.00)
Glucose, Bld: 102 mg/dL — ABNORMAL HIGH (ref 70–99)
HCT: 39 % (ref 36.0–46.0)
Hemoglobin: 13.3 g/dL (ref 12.0–15.0)
Potassium: 4.6 mmol/L (ref 3.5–5.1)
Sodium: 128 mmol/L — ABNORMAL LOW (ref 135–145)
TCO2: 30 mmol/L (ref 22–32)

## 2023-02-14 MED ORDER — CEFADROXIL 500 MG PO CAPS
500.0000 mg | ORAL_CAPSULE | Freq: Two times a day (BID) | ORAL | Status: DC
  Administered 2023-02-14: 500 mg via ORAL
  Filled 2023-02-14 (×5): qty 1

## 2023-02-14 MED ORDER — CEFADROXIL 500 MG PO CAPS: 500.0000 mg | ORAL_CAPSULE | Freq: Two times a day (BID) | ORAL | 0 refills | Status: AC

## 2023-02-14 NOTE — ED Triage Notes (Addendum)
Pt brought in from home by [facility] with c/o fall this morning. Pt reports she didn't lose consciousness but is what made her fall. Pt c/o pain to right hip and right arm to EMS. EMS reports no shortening or deformity and skin tear to right upper arm above the elbow.  Upon arrival to ED pt reports pain is now gone from her right hip but her neck is starting to feel stiff. EMS reports - BP 154/92, CBG 133. Pt was able to stand up with assistance and walk over to the stretcher for EMS. Pt does take blood thinners. Reports hitting her head on her walker.

## 2023-02-14 NOTE — ED Notes (Signed)
Patient transported to CT 

## 2023-02-14 NOTE — ED Provider Notes (Signed)
[place] EMERGENCY DEPARTMENT AT [facility] Provider Note  CSN: [id number] Arrival date & time: 02/14/23 5621  Chief Complaint(s) Fall  HPI [name] is a 89 y.o. female with PMH A-flutter on Eliquis, CHF, lower extremity DVTs, sick sinus syndrome status post pacemaker placement who presents emergency department for evaluation of a fall.  Patient states that she suffered a mechanical fall today landing on her right hip and right arm.  Also endorsing some erythema and pain over the shin on the right surrounding some chronic ulcers.  She states she was supposed to have follow-up with vascular surgery this morning at 10 AM but suffered this fall.  Currently endorses right hip pain, right arm pain and pain at the right flank.  Denies loss of consciousness but does state that she struck her head against a walker.   Past Medical History Past Medical History:  Diagnosis Date   Arthritis    Atrial flutter (HCC)    s/p RFA 2003   Breast cancer (HCC)    1980's - lumpectomy only   CHF (congestive heart failure) (HCC)    Colon polyps    Diverticulosis    DVT, lower extremity (HCC) 1998 and 1999   Bilateral   Essential hypertension    Gallstones    GERD (gastroesophageal reflux disease)    Glaucoma    IBS (irritable bowel syndrome)    Mixed hyperlipidemia    Osteoporosis    Pacemaker    Persistent atrial fibrillation (HCC)    Pneumonia 2011   Sick sinus syndrome (HCC)    PPM (SJM)   Urinary tract infection    Varicose veins    Patient Active Problem List   Diagnosis Date Noted   Prolonged QT interval 11/24/2021   Gastric nodule    Chronic CHF (congestive heart failure) (HCC) 07/02/2021   Iron deficiency anemia 07/02/2021   History of DVT (deep vein thrombosis) 07/02/2021   Pulmonary HTN /echo 11/18/2020 11/18/2020   Glaucoma    Osteoporosis 11/25/2018   B12 deficiency 11/25/2018   Coronary artery disease due to lipid rich plaque 01/27/2016   Persistent  atrial fibrillation (HCC) 09/01/2015   Chronic anticoagulation 09/01/2015   History of total knee arthroplasty 02/10/2015   Sick sinus syndrome (HCC) 03/22/2011   Hyperlipidemia 02/02/2009   Essential hypertension, benign 02/02/2009   COLONIC POLYPS, ADENOMATOUS, HX OF 05/15/2008   Home Medication(s) Prior to Admission medications   Medication Sig Start Date End Date Taking? Authorizing Provider  acetaminophen (TYLENOL) 500 MG tablet Take 1,000 mg by mouth every 6 (six) hours as needed for mild pain.   Yes [provider]  amiodarone (PACERONE) 200 MG tablet TAKE ONE (1) TABLET EACH DAY Patient taking differently: Take 200 mg by mouth daily. 12/01/22  Yes Dettinger, Elige Radon, MD  apixaban (ELIQUIS) 5 MG TABS tablet TAKE ONE (1) TABLET BY MOUTH TWO (2) TIMES DAILY Patient taking differently: Take 5 mg by mouth 2 (two) times daily. 12/12/22  Yes Dettinger, Elige Radon, MD  calcium-vitamin D (OSCAL WITH D) 500-200 MG-UNIT tablet Take 1 tablet by mouth daily with breakfast.    Yes [provider]  cefadroxil (DURICEF) 500 MG capsule Take 1 capsule (500 mg total) by mouth 2 (two) times daily for 7 days. 02/14/23 02/21/23 Yes Azora Bonzo, MD  cyanocobalamin (VITAMIN B12) 1000 MCG/ML injection Inject 1 mL (1,000 mcg total) into the muscle every 30 (thirty) days. 04/10/22  Yes Dettinger, Elige Radon, MD  ezetimibe (ZETIA) 10 MG  tablet Take 1 tablet (10 mg total) by mouth daily. 08/08/22  Yes Jonelle Sidle, MD  furosemide (LASIX) 40 MG tablet Take 1.5 tablets (60 mg total) by mouth daily. May take an extra 20 mg daily as needed for weight gain of 2 lbs/day or 5 lbs/week 11/20/22  Yes Jonelle Sidle, MD  Iron, Ferrous Sulfate, 325 (65 Fe) MG TABS Take 325 mg by mouth daily. 11/10/22  Yes Dettinger, Elige Radon, MD  Multiple Vitamin (MULTIVITAMIN) tablet Take 1 tablet by mouth every morning.   Yes [provider]  nadolol (CORGARD) 40 MG tablet Take 0.5 tablets (20 mg total) by  mouth 2 (two) times daily. 11/05/22  Yes Shahmehdi, Gemma Payor, MD  nitroGLYCERIN (NITROSTAT) 0.4 MG SL tablet Place 1 tablet (0.4 mg total) under the tongue every 5 (five) minutes as needed for chest pain. DISSOLVE 1 TAB UNDER TOUNGE FOR CHEST PAIN. MAY REPEAT EVERY 5 MINUTES FOR 3 DOSES. IF NO RELIEF CALL 911 OR GO TO ER Strength: 0.4 mg 11/07/22  Yes Sharlene Dory, NP  nystatin (MYCOSTATIN/NYSTOP) powder Apply 1 Application topically 3 (three) times daily. 11/11/22  Yes Raspet, Noberto Retort, PA-C  omeprazole (PRILOSEC) 20 MG capsule TAKE ONE CAPSULE BY MOUTH DAILY 01/23/23  Yes Dettinger, Elige Radon, MD  spironolactone (ALDACTONE) 25 MG tablet TAKE 1/2 TABLET BY MOUTH ONCE DAILY 02/14/23  Yes Jonelle Sidle, MD  latanoprost (XALATAN) 0.005 % ophthalmic solution Place 1 drop into both eyes at bedtime.    [provider]                                                                                                                                    Past Surgical History Past Surgical History:  Procedure Laterality Date   BIOPSY  07/04/2021   Procedure: BIOPSY;  Surgeon: Tressia Danas, MD;  Location: [facility] ENDOSCOPY;  Service: Gastroenterology;;   BREAST LUMPECTOMY  [phone]   Right breast    CARDIAC CATHETERIZATION  1999   Normal coronaries   Cataracts Bilateral    CHOLECYSTECTOMY     COLONOSCOPY W/ BIOPSIES AND POLYPECTOMY  03/2003, 05/2008, 02/20/2011   severe diverticulosis, tubulovillous adenoma polyp, internal and external hemorrhoids 2012: severe diverticulosis, 4 mm polyp, hemorrhoids   CYST REMOVAL TRUNK     under breast = on belly    DILATION AND CURETTAGE OF UTERUS     ESOPHAGOGASTRODUODENOSCOPY (EGD) WITH PROPOFOL N/A 07/04/2021   Procedure: ESOPHAGOGASTRODUODENOSCOPY (EGD) WITH PROPOFOL;  Surgeon: Tressia Danas, MD;  Location: [facility] ENDOSCOPY;  Service: Gastroenterology;  Laterality: N/A;   EYE SURGERY     cataracts - bilateral    Fractured wrist Left 2013   repair   INSERT /  REPLACE / REMOVE PACEMAKER     KNEE ARTHROSCOPY WITH MEDIAL MENISECTOMY Right 10/21/2014   Procedure: RIGHT KNEE ARTHROSCOPY WITH MEDIAL MENISECTOM,lateral menisectomy,synovectomy suprapatellar pouch;  Surgeon: Ranee Gosselin, MD;  Location: [facility] ORS;  Service: Orthopedics;  Laterality: Right;   PACEMAKER GENERATOR CHANGE  06/18/12   SJM [facility] DR RF, Dr Johney Frame   PACEMAKER INSERTION  2003   PERMANENT PACEMAKER GENERATOR CHANGE N/A 06/18/2012   Procedure: PERMANENT PACEMAKER GENERATOR CHANGE;  Surgeon: Hillis Range, MD;  Location: [facility] CATH LAB;  Service: Cardiovascular;  Laterality: N/A;   PPM GENERATOR CHANGEOUT N/A 09/30/2021   Procedure: PPM GENERATOR CHANGEOUT;  Surgeon: Hillis Range, MD;  Location: [facility] INVASIVE CV LAB;  Service: Cardiovascular;  Laterality: N/A;   TOTAL KNEE ARTHROPLASTY  12/20/2011   Procedure: TOTAL KNEE ARTHROPLASTY;  Surgeon: Jacki Cones, MD;  Location: [facility] ORS;  Service: Orthopedics;  Laterality: Left;   TOTAL KNEE ARTHROPLASTY Right 02/10/2015   Procedure: TOTAL RIGHT KNEE ARTHROPLASTY;  Surgeon: Ranee Gosselin, MD;  Location: [facility] ORS;  Service: Orthopedics;  Laterality: Right;   Family History Family History  Problem Relation Age of Onset   Throat cancer Father    Stroke Mother    Alzheimer's disease Mother    Alzheimer's disease Brother    Alzheimer's disease Sister    Colon cancer Maternal Grandfather    Stomach cancer Maternal Grandmother    Heart disease Paternal Grandmother    Heart attack Paternal Grandmother    Prostate cancer Son    Colon cancer Sister    Colon cancer Brother    Heart disease Brother    Atrial fibrillation Brother    Diabetes Brother    Dementia Brother    Parkinson's disease Brother    Prostate cancer Son     Social History Social History   Tobacco Use   Smoking status: Never   Smokeless tobacco: Never  Vaping Use   Vaping status: Never Used  Substance Use Topics   Alcohol use: No    Alcohol/week: 0.0 standard drinks of  alcohol   Drug use: No   Allergies Naproxen and Iodinated contrast media  Review of Systems Review of Systems  Genitourinary:  Positive for flank pain.  Musculoskeletal:  Positive for arthralgias and myalgias.  Skin:  Positive for rash.    Physical Exam Vital Signs  I have reviewed the triage vital signs BP 120/65   Pulse 62   Temp 97.6 F (36.4 C) (Oral)   Resp 16   Ht 5\' 3"  (1.6 m)   Wt 70.3 kg   SpO2 98%   BMI 27.46 kg/m   Physical Exam Vitals and nursing note reviewed.  Constitutional:      General: She is not in acute distress.    Appearance: She is well-developed.  HENT:     Head: Normocephalic and atraumatic.  Eyes:     Conjunctiva/sclera: Conjunctivae normal.  Cardiovascular:     Rate and Rhythm: Normal rate and regular rhythm.     Heart sounds: No murmur heard. Pulmonary:     Effort: Pulmonary effort is normal. No respiratory distress.     Breath sounds: Normal breath sounds.  Abdominal:     Palpations: Abdomen is soft.     Tenderness: There is no abdominal tenderness.  Musculoskeletal:        General: No swelling.     Cervical back: Neck supple.  Skin:    General: Skin is warm and dry.     Capillary Refill: Capillary refill takes less than 2 seconds.     Findings: Erythema, lesion and rash present.  Neurological:     Mental Status: She is alert.  Psychiatric:        Mood and  Affect: Mood normal.     ED Results and Treatments Labs (all labs ordered are listed, but only abnormal results are displayed) Labs Reviewed  I-STAT CHEM 8, ED - Abnormal; Notable for the following components:      Result Value   Sodium 128 (*)    Chloride 91 (*)    Glucose, Bld 102 (*)    Calcium, Ion 1.11 (*)    All other components within normal limits                                                                                                                          Radiology CT ABDOMEN PELVIS WO CONTRAST  Result Date: 02/14/2023 CLINICAL DATA:  Abdominal  trauma, blunt. Fall. Patient denies abdominal pain. EXAM: CT ABDOMEN AND PELVIS WITHOUT CONTRAST TECHNIQUE: Multidetector CT imaging of the abdomen and pelvis was performed following the standard protocol without IV contrast. RADIATION DOSE REDUCTION: This exam was performed according to the departmental dose-optimization program which includes automated exposure control, adjustment of the mA and/or kV according to patient size and/or use of iterative reconstruction technique. COMPARISON:  CT scan abdomen and pelvis from 10/21/2022. FINDINGS: Lower chest: The lung bases are clear. No pleural effusion. The heart is normal in size. No pericardial effusion. Coronary artery calcifications noted. Partially seen pacemaker leads. There is apparent hypoattenuation of the blood pool relative to the myocardium, suggestive of anemia. Hepatobiliary: The liver is normal in size. There is mild liver surface irregularity/nodularity, concerning for cirrhosis. No suspicious mass. No intrahepatic or extrahepatic bile duct dilation. Gallbladder is surgically absent. Pancreas: Unremarkable. No pancreatic ductal dilatation or surrounding inflammatory changes. Spleen: Within normal limits. No focal lesion. Adrenals/Urinary Tract: Adrenal glands are unremarkable. No suspicious renal mass. There several simple cysts in bilateral kidneys with largest in the right kidney interpolar region, medially measuring up to 2.1 x 2.3 cm. No hydronephrosis. No renal or ureteric calculi. Unremarkable urinary bladder. Stomach/Bowel: No disproportionate dilation of the small or large bowel loops. No evidence of abnormal bowel wall thickening or inflammatory changes. The appendix is unremarkable. Vascular/Lymphatic: There is small amount of ascites mainly in the perisplenic region and in the dependent pelvis. No pneumoperitoneum. No abdominal or pelvic lymphadenopathy, by size criteria. No aneurysmal dilation of the major abdominal arteries. There are mild  peripheral atherosclerotic vascular calcifications of the aorta and its major branches. Reproductive: The uterus is unremarkable. No large adnexal mass. Other: There is a small fat containing right inguinal hernia. The soft tissues and abdominal wall are otherwise unremarkable. Musculoskeletal: There is diffuse osteopenia of the visualized osseous structures. No suspicious osseous lesions. There are mild multilevel degenerative changes in the visualized spine. There is marked anterior wedging deformity of T12 vertebral body, progressed since the prior study from 10/21/2022. There is mild retropulsion of the vertebral body, posteriorly causing mild central canal stenosis, similar to the prior study. IMPRESSION: 1. No acute traumatic injury in the abdomen or pelvis. 2. Progression of anterior  wedging deformity of T12 vertebral body. 3. Small amount of ascites. Liver surface irregularity/nodularity, concerning for cirrhosis. 4. Findings suggestive of anemia. 5. Multiple other nonacute observations, as described above. Electronically Signed   By: Jules Schick M.D.   On: 02/14/2023 12:52   DG Humerus Right  Result Date: 02/14/2023 CLINICAL DATA:  Fall. EXAM: RIGHT HUMERUS - 2+ VIEW COMPARISON:  None Available. FINDINGS: There is no evidence of fracture or other focal bone lesions. Soft tissues are unremarkable. IMPRESSION: Negative. Electronically Signed   By: Lupita Raider M.D.   On: 02/14/2023 11:17   DG Hip Unilat W or Wo Pelvis 2-3 Views Right  Result Date: 02/14/2023 CLINICAL DATA:  Fall today. EXAM: DG HIP (WITH OR WITHOUT PELVIS) 2-3V RIGHT COMPARISON:  None Available. FINDINGS: There is no evidence of hip fracture or dislocation. There is no evidence of arthropathy or other focal bone abnormality. IMPRESSION: Negative. Electronically Signed   By: Lupita Raider M.D.   On: 02/14/2023 11:16   DG Chest Portable 1 View  Result Date: 02/14/2023 CLINICAL DATA:  Fall today. EXAM: PORTABLE CHEST 1 VIEW  COMPARISON:  January 20, 2023. FINDINGS: Stable cardiomegaly. Right-sided pacemaker is unchanged. Mild left perihilar subsegmental atelectasis or scarring is noted. Right lung is clear. The visualized skeletal structures are unremarkable. IMPRESSION: Mild left perihilar subsegmental atelectasis or scarring. Electronically Signed   By: Lupita Raider M.D.   On: 02/14/2023 11:14   CT HEAD WO CONTRAST ( )  Result Date: 02/14/2023 CLINICAL DATA:  Fall, pain EXAM: CT HEAD WITHOUT CONTRAST CT CERVICAL SPINE WITHOUT CONTRAST TECHNIQUE: Multidetector CT imaging of the head and cervical spine was performed following the standard protocol without intravenous contrast. Multiplanar CT image reconstructions of the cervical spine were also generated. RADIATION DOSE REDUCTION: This exam was performed according to the departmental dose-optimization program which includes automated exposure control, adjustment of the mA and/or kV according to patient size and/or use of iterative reconstruction technique. COMPARISON:  CT head and cervical spine 01/20/2023 FINDINGS: CT HEAD FINDINGS Brain: There is no acute intracranial hemorrhage, extra-axial fluid collection, or acute infarct. Parenchymal volume is normal for age. The ventricles are normal in size. Gray-white differentiation is preserved. The pituitary and suprasellar region are normal. There is no mass lesion. There is no mass effect or midline shift. Vascular: There is calcification of the bilateral carotid siphons. Skull: Normal. Negative for fracture or focal lesion. Sinuses/Orbits: Mucosal thickening in the left maxillary sinus is again seen. Bilateral lens implants are in place. The globes and orbits are otherwise unremarkable. Other: The mastoid air cells and middle ear cavities are clear. CT CERVICAL SPINE FINDINGS Alignment: Normal. Skull base and vertebrae: The bones are diffusely demineralized. Skull base alignment is normal. Vertebral body heights are preserved.  There is no evidence of acute fracture. Lucency through a prominent osteophyte arising from the right C4 inferior articulating process is unchanged since the study from 01/20/2023, favored remote. There is no suspicious osseous lesion. Soft tissues and spinal canal: No prevertebral fluid or swelling. No visible canal hematoma. Disc levels: There is multilevel facet arthropathy throughout the cervical spine. The disc spaces are overall preserved without significant degenerative endplate change. There is no evidence of significant spinal canal stenosis. Upper chest: Linear scarring in the right lung apex is unchanged. Other: None. IMPRESSION: 1. No acute intracranial pathology. 2. No acute fracture or traumatic malalignment of the cervical spine. Electronically Signed   By: Lesia Hausen M.D.   On: 02/14/2023  10:59   CT CERVICAL SPINE WO CONTRAST  Result Date: 02/14/2023 CLINICAL DATA:  Fall, pain EXAM: CT HEAD WITHOUT CONTRAST CT CERVICAL SPINE WITHOUT CONTRAST TECHNIQUE: Multidetector CT imaging of the head and cervical spine was performed following the standard protocol without intravenous contrast. Multiplanar CT image reconstructions of the cervical spine were also generated. RADIATION DOSE REDUCTION: This exam was performed according to the departmental dose-optimization program which includes automated exposure control, adjustment of the mA and/or kV according to patient size and/or use of iterative reconstruction technique. COMPARISON:  CT head and cervical spine 01/20/2023 FINDINGS: CT HEAD FINDINGS Brain: There is no acute intracranial hemorrhage, extra-axial fluid collection, or acute infarct. Parenchymal volume is normal for age. The ventricles are normal in size. Gray-white differentiation is preserved. The pituitary and suprasellar region are normal. There is no mass lesion. There is no mass effect or midline shift. Vascular: There is calcification of the bilateral carotid siphons. Skull: Normal.  Negative for fracture or focal lesion. Sinuses/Orbits: Mucosal thickening in the left maxillary sinus is again seen. Bilateral lens implants are in place. The globes and orbits are otherwise unremarkable. Other: The mastoid air cells and middle ear cavities are clear. CT CERVICAL SPINE FINDINGS Alignment: Normal. Skull base and vertebrae: The bones are diffusely demineralized. Skull base alignment is normal. Vertebral body heights are preserved. There is no evidence of acute fracture. Lucency through a prominent osteophyte arising from the right C4 inferior articulating process is unchanged since the study from 01/20/2023, favored remote. There is no suspicious osseous lesion. Soft tissues and spinal canal: No prevertebral fluid or swelling. No visible canal hematoma. Disc levels: There is multilevel facet arthropathy throughout the cervical spine. The disc spaces are overall preserved without significant degenerative endplate change. There is no evidence of significant spinal canal stenosis. Upper chest: Linear scarring in the right lung apex is unchanged. Other: None. IMPRESSION: 1. No acute intracranial pathology. 2. No acute fracture or traumatic malalignment of the cervical spine. Electronically Signed   By: Lesia Hausen M.D.   On: 02/14/2023 10:59    Pertinent labs & imaging results that were available during my care of the patient were reviewed by me and considered in my medical decision making (see MDM for details).  Medications Ordered in ED Medications  cefadroxil (DURICEF) capsule 500 mg (500 mg Oral Given 02/14/23 1246)                                                                                                                                     Procedures Procedures  (including critical care time)  Medical Decision Making / ED Course   This patient presents to the ED for concern of fall, this involves an extensive number of treatment options, and is a complaint that carries with it a  high risk of complications and morbidity.  The differential diagnosis includes fracture, contusion, hematoma, ligamentous injury, ICH, cellulitis, venous stasis,  insufficiency ulcer  MDM: Patient seen in the emergency room for evaluation of a fall.  Physical exam with a skin tear over the right humerus, tenderness at the right hip, erythema and tenderness over the right shin, right CVA tenderness.  I-STAT Chem-8 with mild hyponatremia to 128, normal hemoglobin.  Trauma imaging including CT head, C-spine and CT abdomen pelvis with no acute traumatic injury, progressive anterior wedging deformity of T12, small amount of ascites and findings suggestive of anemia.  X-ray imaging of the humerus, hip and chest that is reassuringly unremarkable for traumatic injury as well.  Patient's lower extremity erythema appears consistent with a cellulitis surrounding an open wound.  Will cover with Duricef.  At this time, with negative trauma workup, patient does not meet inpatient criteria for admission.  She is able to ambulate without difficulty in the emergency department.  Patient discharged in the care of her her son.  Return precautions given of which she and her son voiced understanding.   Additional history obtained: -Additional history obtained from son -External records from outside source obtained and reviewed including: Chart review including previous notes, labs, imaging, consultation notes   Lab Tests: -I ordered, reviewed, and interpreted labs.   The pertinent results include:   Labs Reviewed  I-STAT CHEM 8, ED - Abnormal; Notable for the following components:      Result Value   Sodium 128 (*)    Chloride 91 (*)    Glucose, Bld 102 (*)    Calcium, Ion 1.11 (*)    All other components within normal limits      Imaging Studies ordered: I ordered imaging studies including CT head, C-spine, abdomen pelvis, x-ray chest, humerus, hip I independently visualized and interpreted imaging. I agree  with the radiologist interpretation   Medicines ordered and prescription drug management: Meds ordered this encounter  Medications   cefadroxil (DURICEF) capsule 500 mg   cefadroxil (DURICEF) 500 MG capsule    Sig: Take 1 capsule (500 mg total) by mouth 2 (two) times daily for 7 days.    Dispense:  14 capsule    Refill:  0    -I have reviewed the patients home medicines and have made adjustments as needed  Critical interventions none   Cardiac Monitoring: The patient was maintained on a cardiac monitor.  I personally viewed and interpreted the cardiac monitored which showed an underlying rhythm of: NSR  Social Determinants of Health:  Factors impacting patients care include: none   Reevaluation: After the interventions noted above, I reevaluated the patient and found that they have :improved  Co morbidities that complicate the patient evaluation  Past Medical History:  Diagnosis Date   Arthritis    Atrial flutter (HCC)    s/p RFA 2003   Breast cancer (HCC)    1980's - lumpectomy only   CHF (congestive heart failure) (HCC)    Colon polyps    Diverticulosis    DVT, lower extremity (HCC) 1998 and 1999   Bilateral   Essential hypertension    Gallstones    GERD (gastroesophageal reflux disease)    Glaucoma    IBS (irritable bowel syndrome)    Mixed hyperlipidemia    Osteoporosis    Pacemaker    Persistent atrial fibrillation (HCC)    Pneumonia 2011   Sick sinus syndrome (HCC)    PPM (SJM)   Urinary tract infection    Varicose veins       Dispostion: I considered admission for this patient, but  at this time she does not meet inpatient criteria for admission and she is safe for discharge with outpatient follow-up     Final Clinical Impression(s) / ED Diagnoses Final diagnoses:  Fall, initial encounter  Skin tear of right elbow without complication, initial encounter  Cellulitis of right lower extremity     @PCDICTATION @    Glendora Score,  MD 02/14/23 [phone]

## 2023-02-16 DIAGNOSIS — L97219 Non-pressure chronic ulcer of right calf with unspecified severity: Secondary | ICD-10-CM | POA: Diagnosis not present

## 2023-02-21 DIAGNOSIS — R296 Repeated falls: Secondary | ICD-10-CM | POA: Diagnosis not present

## 2023-02-21 DIAGNOSIS — I5042 Chronic combined systolic (congestive) and diastolic (congestive) heart failure: Secondary | ICD-10-CM | POA: Diagnosis not present

## 2023-02-21 DIAGNOSIS — Z515 Encounter for palliative care: Secondary | ICD-10-CM | POA: Diagnosis not present

## 2023-02-22 DIAGNOSIS — L97219 Non-pressure chronic ulcer of right calf with unspecified severity: Secondary | ICD-10-CM | POA: Diagnosis not present

## 2023-03-01 DIAGNOSIS — I872 Venous insufficiency (chronic) (peripheral): Secondary | ICD-10-CM | POA: Diagnosis not present

## 2023-03-08 ENCOUNTER — Ambulatory Visit: Payer: Medicare Other | Attending: Nurse Practitioner | Admitting: Nurse Practitioner

## 2023-03-08 ENCOUNTER — Encounter: Payer: Self-pay | Admitting: Nurse Practitioner

## 2023-03-08 VITALS — BP 102/60 | HR 57 | Ht 63.0 in | Wt 144.8 lb

## 2023-03-08 DIAGNOSIS — R0989 Other specified symptoms and signs involving the circulatory and respiratory systems: Secondary | ICD-10-CM | POA: Diagnosis not present

## 2023-03-08 DIAGNOSIS — Z79899 Other long term (current) drug therapy: Secondary | ICD-10-CM | POA: Diagnosis not present

## 2023-03-08 DIAGNOSIS — I495 Sick sinus syndrome: Secondary | ICD-10-CM | POA: Diagnosis not present

## 2023-03-08 DIAGNOSIS — Z950 Presence of cardiac pacemaker: Secondary | ICD-10-CM | POA: Diagnosis not present

## 2023-03-08 DIAGNOSIS — I5032 Chronic diastolic (congestive) heart failure: Secondary | ICD-10-CM | POA: Diagnosis not present

## 2023-03-08 DIAGNOSIS — I4892 Unspecified atrial flutter: Secondary | ICD-10-CM | POA: Diagnosis not present

## 2023-03-08 DIAGNOSIS — Z86718 Personal history of other venous thrombosis and embolism: Secondary | ICD-10-CM | POA: Diagnosis not present

## 2023-03-08 DIAGNOSIS — E782 Mixed hyperlipidemia: Secondary | ICD-10-CM | POA: Diagnosis not present

## 2023-03-08 DIAGNOSIS — I5042 Chronic combined systolic (congestive) and diastolic (congestive) heart failure: Secondary | ICD-10-CM

## 2023-03-08 DIAGNOSIS — I4819 Other persistent atrial fibrillation: Secondary | ICD-10-CM

## 2023-03-08 DIAGNOSIS — I480 Paroxysmal atrial fibrillation: Secondary | ICD-10-CM

## 2023-03-08 DIAGNOSIS — I6523 Occlusion and stenosis of bilateral carotid arteries: Secondary | ICD-10-CM

## 2023-03-08 NOTE — Patient Instructions (Addendum)
Medication Instructions:  Your physician recommends that you continue on your current medications as directed. Please refer to the Current Medication list given to you today.  Labwork: BMET, BNP, MAG in 1-2 weeks   Testing/Procedures: None  Follow-Up: Your physician recommends that you schedule a follow-up appointment in: 4-6 weeks   Any Other Special Instructions Will Be Listed Below (If Applicable).  If you need a refill on your cardiac medications before your next appointment, please call your pharmacy.

## 2023-03-08 NOTE — Progress Notes (Addendum)
Cardiology Office Note:  .   Date:  03/09/2023  ID:  [name], DOB 12/27/1928, MRN [id number] PCP: Dettinger, Elige Radon, MD  [facility] Providers Cardiologist:  Nona Dell, MD Electrophysiologist:  Hillis Range, MD (Inactive)    History of Present Illness: .   [name] is a 89 y.o. female with a PMH of persistent A-fib, A-flutter, s/p RFA in 2003, HTN, mixed HLD, pulmonary HTN,  hx of bilateral DVT, SSS, s/p PPM, HFpEF, and hx of breast cancer, s/p lumpectomy, who presents today for 3-4 week follow-up.   Admitted 11/2021 for hypoxia, SHOB/DOE. Received IV diuresis/ ABX. D/C home with HHPT. Echo on 11/14/22 showed EF 55%, severe pulmonary HTN. Referred to Pulmonology. Followed by Palliative Care.  Last seen by Dr. Diona Browner on November 20, 2022. Weight was relatively stable. Started on Aldactone 12.5 mg daily, Lasix changed to standing 60 gm daily, may take 20 mg PRN for extra weight gain.   Last seen for 3-4 week follow-up on December 12, 2022. Son stated her weight was stable since being started on Aldactone. Denied any chest pain, shortness of breath, palpitations, syncope, presyncope, dizziness, orthopnea, PND, swelling or significant weight changes, acute bleeding, or claudication. Did note some fullness along her abdomen at times, denied any abdominal pain. Overall doing well.   Since last office visit, she has had 2 ED visits. First fall occurred in early August while falling forward after sitting in her chair and hitting her head. CT head negative for anything acute. Imaging negative. Workup overall unremarkable. Second fall was a mechanical fall, denied LOC, struck her head against a walker. Imaging negative for any fractures. CT abdomen revealed small amount of ascites with liver surface irregularity/nodularity, concerning for cirrhosis, no acute traumatic injury in the abdomen or pelvis.  Today she presents for follow-up with her son. Has not been doing well  recently. Son says she sleeps majority of the day, appears very fatigued. Followed by Palliative Care. Does endorse some recent shortness of breath and edema, has been taking extra doses of Lasix PRN as prescribed that has helped improve her symptoms.  Denies any fever, chills, or flulike symptoms.  Son says she has been going out more in the community recently. Denies any chest pain, palpitations, syncope, presyncope, dizziness, orthopnea, PND, significant weight changes, acute bleeding, or claudication.  Negative: See HPI.  Studies Reviewed: Marland Kitchen   EKG is not ordered today.  Carotid duplex 05/2022: Summary:  Right Carotid: Velocities in the right ICA are consistent with a 1-39% stenosis.   Left Carotid: Velocities in the left ICA are consistent with a 1-39% stenosis.   Vertebrals: Bilateral vertebral arteries demonstrate antegrade flow. Subclavians: Normal flow hemodynamics were seen in bilateral subclavian arteries.    Limited echo on November 24, 2021:  1. Limited study with limited images   2. Left ventricular ejection fraction, by estimation, is 60 to 65%. Left  ventricular ejection fraction by 2D MOD biplane is 63.6 %. The left  ventricle has normal function. The left ventricle has no regional wall  motion abnormalities. There is moderate  left ventricular hypertrophy.   3. The right ventricular size is mildly enlarged. Tricuspid regurgitation  signal is inadequate for assessing PA pressure.   4. Right atrial size was severely dilated.   5. The aortic valve is tricuspid. Aortic valve regurgitation is not  visualized. Aortic valve sclerosis/calcification is present, without any  evidence of aortic stenosis.   6. The inferior vena cava is  dilated in size with <50% respiratory  variability, suggesting right atrial pressure of 15 mmHg.   Comparison(s): No significant change from prior study. 07/03/2021: LVEF  60-65%.  Risk Assessment/Calculations:    CHA2DS2-VASc Score = 5  This  indicates a 7.2% annual risk of stroke. The patient's score is based upon: CHF History: 1 HTN History: 1 Diabetes History: 0 Stroke History: 0 Vascular Disease History: 0 Age Score: 2 Gender Score: 1  Physical Exam:   VS:  BP 102/60   Pulse (!) 57   Ht 5\' 3"  (1.6 m)   Wt 144 lb 12.8 oz (65.7 kg)   SpO2 97%   BMI 25.65 kg/m    Wt Readings from Last 3 Encounters:  03/08/23 144 lb 12.8 oz (65.7 kg)  02/14/23 155 lb (70.3 kg)  01/30/23 148 lb (67.1 kg)    GEN: Well nourished, well developed in no acute distress NECK: No JVD; No carotid bruits CARDIAC: S1/S2, RRR, no murmurs, rubs, gallops RESPIRATORY:  Clear and diminished to auscultation without rales, wheezing or rhonchi, fine diminished crackles to posterior left lower lobe  EXTREMITIES:  No edema; No deformity   ASSESSMENT AND PLAN: .    Chronic HFpEF, medication management Stage C, NYHA class II-III symptoms. Echo on 11/14/22 showed EF 55%, severe pulmonary HTN. Limited echo in June 2023 revealed EF 60 to 65%, moderate LVH.  Overall appears euvolemic and well compensated on exam. Low sodium diet, fluid restriction <2L, and daily weights encouraged. Educated to contact our office for weight gain of 2 lbs overnight or 5 lbs in one week. Did endorse taking extra doses of Lasix recently that has helped her symptoms of leg swelling and shortness of breath.  Will obtain BMET, proBNP, and magnesium.  Continue current medication regimen. Heart healthy diet and regular cardiovascular exercise encouraged.  Goals of care reviewed/discussed.  Continue to follow-up with palliative care as scheduled.  PAF, A-flutter s/p RFA in 2003 Denies any tachycardia or palpitations. Continue current medication regimen. Heart healthy diet and regular cardiovascular exercise encouraged.   SSS, s/p PPM Most recent remote device check revealed normal device function without any events or anything acute. Continue to follow-up with EP.   Mixed HLD Labs from  October 2023 revealed total cholesterol 162, HDL 57, LDL 90, and triglycerides 76. Continue Zetia. Heart healthy diet and regular cardiovascular exercise encouraged. Has deferred rechecking FLP to Dr. Louanne Skye, has upcoming OV.   Carotid artery stenosis Mild bilateral carotid artery stenosis, 1 to 39%, 05/2022.  Asymptomatic.  Continue current medication regimen. Heart healthy diet and regular cardiovascular exercise encouraged.   Hx of bilateral DVT Denies any leg swelling or claudication. She is compliant with Eliquis. Denies any bleeding issues. Continue Eliquis 5 mg twice daily. At next office visit, plan to discuss reducing Eliquis to 2.5 mg BID.   7. Lung crackles Etiology multifactorial. Will obtain 2 view CXR for further evaluation. Continue to follow with PCP. Care and ED precautions discussed.  Dispo: Follow-up with me or APP in 4-6 weeks or sooner if anything changes.   Signed, Sharlene Dory, NP

## 2023-03-09 ENCOUNTER — Encounter: Payer: Self-pay | Admitting: Nurse Practitioner

## 2023-03-12 ENCOUNTER — Ambulatory Visit: Payer: Medicare Other | Admitting: Family Medicine

## 2023-03-13 ENCOUNTER — Other Ambulatory Visit: Payer: Medicare Other

## 2023-03-13 DIAGNOSIS — I5042 Chronic combined systolic (congestive) and diastolic (congestive) heart failure: Secondary | ICD-10-CM | POA: Diagnosis not present

## 2023-03-13 DIAGNOSIS — E782 Mixed hyperlipidemia: Secondary | ICD-10-CM | POA: Diagnosis not present

## 2023-03-13 DIAGNOSIS — I4819 Other persistent atrial fibrillation: Secondary | ICD-10-CM | POA: Diagnosis not present

## 2023-03-13 DIAGNOSIS — Z79899 Other long term (current) drug therapy: Secondary | ICD-10-CM | POA: Diagnosis not present

## 2023-03-14 ENCOUNTER — Other Ambulatory Visit: Payer: Medicare Other

## 2023-03-14 LAB — BASIC METABOLIC PANEL
BUN/Creatinine Ratio: 21 (ref 12–28)
BUN: 18 mg/dL (ref 10–36)
CO2: 30 mmol/L — ABNORMAL HIGH (ref 20–29)
Calcium: 9.5 mg/dL (ref 8.7–10.3)
Chloride: 91 mmol/L — ABNORMAL LOW (ref 96–106)
Creatinine, Ser: 0.87 mg/dL (ref 0.57–1.00)
Glucose: 100 mg/dL — ABNORMAL HIGH (ref 70–99)
Potassium: 3.6 mmol/L (ref 3.5–5.2)
Sodium: 134 mmol/L (ref 134–144)
eGFR: 63 mL/min/{1.73_m2} (ref >59)

## 2023-03-14 LAB — MAGNESIUM: Magnesium: 2.2 mg/dL (ref 1.6–2.3)

## 2023-03-15 LAB — BRAIN NATRIURETIC PEPTIDE: BNP: 481.4 pg/mL — ABNORMAL HIGH (ref 0.0–100.0)

## 2023-03-27 DIAGNOSIS — I83893 Varicose veins of bilateral lower extremities with other complications: Secondary | ICD-10-CM | POA: Diagnosis not present

## 2023-03-27 DIAGNOSIS — R600 Localized edema: Secondary | ICD-10-CM | POA: Diagnosis not present

## 2023-03-27 DIAGNOSIS — I872 Venous insufficiency (chronic) (peripheral): Secondary | ICD-10-CM | POA: Diagnosis not present

## 2023-03-30 DIAGNOSIS — R600 Localized edema: Secondary | ICD-10-CM | POA: Diagnosis not present

## 2023-04-02 ENCOUNTER — Ambulatory Visit (INDEPENDENT_AMBULATORY_CARE_PROVIDER_SITE_OTHER)

## 2023-04-02 DIAGNOSIS — I5032 Chronic diastolic (congestive) heart failure: Secondary | ICD-10-CM

## 2023-04-02 LAB — CUP PACEART REMOTE DEVICE CHECK
Battery Remaining Longevity: 95 mo
Battery Remaining Percentage: 86 %
Battery Voltage: 3.01 V
Brady Statistic AP VP Percent: 99 %
Brady Statistic AP VS Percent: 1 %
Brady Statistic AS VP Percent: 1 %
Brady Statistic AS VS Percent: 0 %
Brady Statistic RA Percent Paced: 99 %
Brady Statistic RV Percent Paced: 99 %
Date Time Interrogation Session: 20241021103742
Implantable Lead Connection Status: 753985
Implantable Lead Connection Status: 753985
Implantable Lead Implant Date: 20031028
Implantable Lead Implant Date: 20031028
Implantable Lead Location: 753859
Implantable Lead Location: 753860
Implantable Pulse Generator Implant Date: 20230421
Lead Channel Impedance Value: 390 Ohm
Lead Channel Impedance Value: 460 Ohm
Lead Channel Pacing Threshold Amplitude: 0.5 V
Lead Channel Pacing Threshold Amplitude: 0.5 V
Lead Channel Pacing Threshold Pulse Width: 0.5 ms
Lead Channel Pacing Threshold Pulse Width: 0.5 ms
Lead Channel Sensing Intrinsic Amplitude: 12 mV
Lead Channel Setting Pacing Amplitude: 0.75 V
Lead Channel Setting Pacing Amplitude: 2 V
Lead Channel Setting Pacing Pulse Width: 0.5 ms
Lead Channel Setting Sensing Sensitivity: 4 mV
Pulse Gen Model: 2272
Pulse Gen Serial Number: 8074688

## 2023-04-05 ENCOUNTER — Observation Stay (HOSPITAL_COMMUNITY)
Admission: EM | Admit: 2023-04-05 | Discharge: 2023-04-06 | Disposition: A | Discharge status: Home / Self Care | Payer: Medicare Other | Attending: Internal Medicine | Admitting: Internal Medicine

## 2023-04-05 ENCOUNTER — Other Ambulatory Visit: Payer: Self-pay

## 2023-04-05 ENCOUNTER — Encounter (HOSPITAL_COMMUNITY): Payer: Self-pay | Admitting: Internal Medicine

## 2023-04-05 ENCOUNTER — Emergency Department (HOSPITAL_COMMUNITY): Payer: Medicare Other

## 2023-04-05 DIAGNOSIS — M85871 Other specified disorders of bone density and structure, right ankle and foot: Secondary | ICD-10-CM | POA: Diagnosis not present

## 2023-04-05 DIAGNOSIS — M7731 Calcaneal spur, right foot: Secondary | ICD-10-CM | POA: Diagnosis not present

## 2023-04-05 DIAGNOSIS — M79661 Pain in right lower leg: Secondary | ICD-10-CM | POA: Diagnosis not present

## 2023-04-05 DIAGNOSIS — I495 Sick sinus syndrome: Secondary | ICD-10-CM | POA: Diagnosis present

## 2023-04-05 DIAGNOSIS — I5030 Unspecified diastolic (congestive) heart failure: Secondary | ICD-10-CM | POA: Diagnosis not present

## 2023-04-05 DIAGNOSIS — I110 Hypertensive heart disease with heart failure: Secondary | ICD-10-CM | POA: Insufficient documentation

## 2023-04-05 DIAGNOSIS — I4819 Other persistent atrial fibrillation: Secondary | ICD-10-CM | POA: Diagnosis not present

## 2023-04-05 DIAGNOSIS — R600 Localized edema: Secondary | ICD-10-CM | POA: Diagnosis not present

## 2023-04-05 DIAGNOSIS — L03115 Cellulitis of right lower limb: Secondary | ICD-10-CM | POA: Diagnosis not present

## 2023-04-05 DIAGNOSIS — Z950 Presence of cardiac pacemaker: Secondary | ICD-10-CM | POA: Insufficient documentation

## 2023-04-05 DIAGNOSIS — Z853 Personal history of malignant neoplasm of breast: Secondary | ICD-10-CM | POA: Insufficient documentation

## 2023-04-05 DIAGNOSIS — I5032 Chronic diastolic (congestive) heart failure: Secondary | ICD-10-CM | POA: Diagnosis present

## 2023-04-05 DIAGNOSIS — Z86718 Personal history of other venous thrombosis and embolism: Secondary | ICD-10-CM | POA: Diagnosis not present

## 2023-04-05 DIAGNOSIS — Z79899 Other long term (current) drug therapy: Secondary | ICD-10-CM | POA: Diagnosis not present

## 2023-04-05 DIAGNOSIS — I10 Essential (primary) hypertension: Secondary | ICD-10-CM | POA: Diagnosis present

## 2023-04-05 DIAGNOSIS — Z7901 Long term (current) use of anticoagulants: Secondary | ICD-10-CM | POA: Diagnosis not present

## 2023-04-05 DIAGNOSIS — E785 Hyperlipidemia, unspecified: Secondary | ICD-10-CM | POA: Diagnosis present

## 2023-04-05 DIAGNOSIS — Z96653 Presence of artificial knee joint, bilateral: Secondary | ICD-10-CM | POA: Insufficient documentation

## 2023-04-05 LAB — CBC WITH DIFFERENTIAL/PLATELET
Abs Immature Granulocytes: 0.03 10*3/uL (ref 0.00–0.07)
Basophils Absolute: 0 10*3/uL (ref 0.0–0.1)
Basophils Relative: 0 %
Eosinophils Absolute: 0.1 10*3/uL (ref 0.0–0.5)
Eosinophils Relative: 1 %
HCT: 36.5 % (ref 36.0–46.0)
Hemoglobin: 12 g/dL (ref 12.0–15.0)
Immature Granulocytes: 0 %
Lymphocytes Relative: 17 %
Lymphs Abs: 1.2 10*3/uL (ref 0.7–4.0)
MCH: 34.6 pg — ABNORMAL HIGH (ref 26.0–34.0)
MCHC: 32.9 g/dL (ref 30.0–36.0)
MCV: 105.2 fL — ABNORMAL HIGH (ref 80.0–100.0)
Monocytes Absolute: 1.1 10*3/uL — ABNORMAL HIGH (ref 0.1–1.0)
Monocytes Relative: 16 %
Neutro Abs: 4.5 10*3/uL (ref 1.7–7.7)
Neutrophils Relative %: 66 %
Platelets: 171 10*3/uL (ref 150–400)
RBC: 3.47 MIL/uL — ABNORMAL LOW (ref 3.87–5.11)
RDW: 14.8 % (ref 11.5–15.5)
WBC: 6.9 10*3/uL (ref 4.0–10.5)
nRBC: 0 % (ref 0.0–0.2)

## 2023-04-05 LAB — SEDIMENTATION RATE: Sed Rate: 45 mm/h — ABNORMAL HIGH (ref 0–22)

## 2023-04-05 LAB — COMPREHENSIVE METABOLIC PANEL
ALT: 14 U/L (ref 0–44)
AST: 23 U/L (ref 15–41)
Albumin: 3.3 g/dL — ABNORMAL LOW (ref 3.5–5.0)
Alkaline Phosphatase: 58 U/L (ref 38–126)
Anion gap: 14 (ref 5–15)
BUN: 20 mg/dL (ref 8–23)
CO2: 25 mmol/L (ref 22–32)
Calcium: 9.6 mg/dL (ref 8.9–10.3)
Chloride: 93 mmol/L — ABNORMAL LOW (ref 98–111)
Creatinine, Ser: 0.78 mg/dL (ref 0.44–1.00)
GFR, Estimated: >60 mL/min (ref >60)
Glucose, Bld: 105 mg/dL — ABNORMAL HIGH (ref 70–99)
Potassium: 4.5 mmol/L (ref 3.5–5.1)
Sodium: 132 mmol/L — ABNORMAL LOW (ref 135–145)
Total Bilirubin: 1.7 mg/dL — ABNORMAL HIGH (ref 0.3–1.2)
Total Protein: 6.4 g/dL — ABNORMAL LOW (ref 6.5–8.1)

## 2023-04-05 LAB — I-STAT CG4 LACTIC ACID, ED: Lactic Acid, Venous: 0.9 mmol/L (ref 0.5–1.9)

## 2023-04-05 LAB — C-REACTIVE PROTEIN: CRP: 7.3 mg/dL — ABNORMAL HIGH (ref <1.0)

## 2023-04-05 MED ORDER — PROCHLORPERAZINE EDISYLATE 10 MG/2ML IJ SOLN: 10.0000 mg | Freq: Four times a day (QID) | INTRAMUSCULAR | Status: DC | PRN

## 2023-04-05 MED ORDER — EZETIMIBE 10 MG PO TABS
10.0000 mg | ORAL_TABLET | Freq: Every day | ORAL | Status: DC
  Administered 2023-04-06: 10 mg via ORAL
  Filled 2023-04-05: qty 1

## 2023-04-05 MED ORDER — METRONIDAZOLE 500 MG PO TABS
500.0000 mg | ORAL_TABLET | Freq: Two times a day (BID) | ORAL | Status: DC
  Administered 2023-04-05: 500 mg via ORAL
  Filled 2023-04-05: qty 1

## 2023-04-05 MED ORDER — FAMOTIDINE 20 MG PO TABS
20.0000 mg | ORAL_TABLET | Freq: Every day | ORAL | Status: DC
  Administered 2023-04-06: 20 mg via ORAL
  Filled 2023-04-05: qty 1

## 2023-04-05 MED ORDER — SODIUM CHLORIDE 0.9 % IV SOLN
2.0000 g | Freq: Two times a day (BID) | INTRAVENOUS | Status: DC
  Administered 2023-04-05: 2 g via INTRAVENOUS
  Filled 2023-04-05: qty 12.5

## 2023-04-05 MED ORDER — VANCOMYCIN HCL 750 MG/150ML IV SOLN
750.0000 mg | INTRAVENOUS | Status: DC
  Administered 2023-04-05: 750 mg via INTRAVENOUS
  Filled 2023-04-05: qty 150

## 2023-04-05 MED ORDER — NADOLOL 20 MG PO TABS
20.0000 mg | ORAL_TABLET | Freq: Two times a day (BID) | ORAL | Status: DC
  Administered 2023-04-06: 20 mg via ORAL
  Filled 2023-04-05 (×2): qty 1

## 2023-04-05 MED ORDER — SENNOSIDES-DOCUSATE SODIUM 8.6-50 MG PO TABS: 1.0000 | ORAL_TABLET | Freq: Every evening | ORAL | Status: DC | PRN

## 2023-04-05 MED ORDER — ACETAMINOPHEN 650 MG RE SUPP: 650.0000 mg | Freq: Four times a day (QID) | RECTAL | Status: DC | PRN

## 2023-04-05 MED ORDER — ACETAMINOPHEN 325 MG PO TABS: 650.0000 mg | ORAL_TABLET | Freq: Four times a day (QID) | ORAL | Status: DC | PRN

## 2023-04-05 MED ORDER — APIXABAN 5 MG PO TABS
5.0000 mg | ORAL_TABLET | Freq: Two times a day (BID) | ORAL | Status: DC
  Administered 2023-04-06: 5 mg via ORAL
  Filled 2023-04-05: qty 1

## 2023-04-05 MED ORDER — LATANOPROST 0.005 % OP SOLN
1.0000 [drp] | Freq: Every day | OPHTHALMIC | Status: DC
  Filled 2023-04-05: qty 2.5

## 2023-04-05 MED ORDER — AMIODARONE HCL 200 MG PO TABS
200.0000 mg | ORAL_TABLET | Freq: Every day | ORAL | Status: DC
  Administered 2023-04-06: 200 mg via ORAL
  Filled 2023-04-05: qty 1

## 2023-04-05 MED ORDER — FUROSEMIDE 40 MG PO TABS
60.0000 mg | ORAL_TABLET | Freq: Every day | ORAL | Status: DC
  Administered 2023-04-06: 60 mg via ORAL
  Filled 2023-04-05: qty 1

## 2023-04-05 NOTE — ED Provider Triage Note (Signed)
Emergency Medicine Provider Triage Evaluation Note  [name] , a 89 y.o. female  was evaluated in triage.  Pt complains of foot infection. Increasing pain, swelling and redness to R foot ongoing x 1 week.  Seen by PCP today and sent here due to concerns of infection.  No hx of DM.  No fever, chills, body aches, n/v/d.  No trauma  Review of Systems  Positive: As above Negative: As above  Physical Exam  BP 130/73 (BP Location: Left Arm)   Pulse (!) 58   Temp 97.9 F (36.6 C)   Resp 18   Ht 5\' 3"  (1.6 m)   Wt 62.1 kg   SpO2 95%   BMI 24.27 kg/m  Gen:   Awake, no distress   Resp:  Normal effort  MSK:   Moves extremities without difficulty  Other:    Medical Decision Making  Medically screening exam initiated at 4:21 PM.  Appropriate orders placed.  [place] Mc[place] was informed that the remainder of the evaluation will be completed by another provider, this initial triage assessment does not replace that evaluation, and the importance of remaining in the ED until their evaluation is complete.     Fayrene Helper, PA-C 04/05/23 1622

## 2023-04-05 NOTE — ED Notes (Signed)
ED TO INPATIENT HANDOFF REPORT  ED Nurse Name and Phone #: Amil Amen [phone]  S Name/Age/Gender [name] 89 y.o. female Room/Bed: 033C/033C  Code Status   Code Status: Limited: Do not attempt resuscitation (DNR) -DNR-LIMITED -Do Not Intubate/DNI   Home/SNF/Other Home Patient oriented to: self, place, time, and situation Is this baseline? Yes   Triage Complete: Triage complete  Chief Complaint Cellulitis of right foot [L03.115]  Triage Note Pt has had wound on right foot that has been getting worse over the last couple of weeks. Foot is red and swollen. Pt has been seen for wound and wound has been cultured. Pt is ambulatory but with pain.   Allergies Allergies  Allergen Reactions   Naproxen Hives, Itching, Rash and Other (See Comments)   Iodinated Contrast Media Hives, Itching, Rash and Other (See Comments)    03/13/14: symptoms began within two hours of intrathecal injection (myelogram 03/11/14); on shoulders radiating down to stomach.  Relief with Benadryl.  Told patient she would need to pre-med with Benadryl in the future before receiving this contrast.  Donell Sievert, RN    Level of Care/Admitting Diagnosis ED Disposition     ED Disposition  Admit   Condition  --   Comment  Hospital Area: [facility] [[id number]]  Level of Care: Med-Surg [16]  May place patient in observation at [facility] or [facility] if equivalent level of care is available:: No  Covid Evaluation: Asymptomatic - no recent exposure (last 10 days) testing not required  Diagnosis: Cellulitis of right foot [454098]  Admitting Physician: Charlsie Quest [[id number]]  Attending Physician: Charlsie Quest [[id number]]          B Medical/Surgery History Past Medical History:  Diagnosis Date   Arthritis    Atrial flutter (HCC)    s/p RFA 2003   Breast cancer (HCC)    1980's - lumpectomy only   CHF (congestive heart failure) (HCC)    Colon polyps    Diverticulosis    DVT,  lower extremity (HCC) 1998 and 1999   Bilateral   Essential hypertension    Gallstones    GERD (gastroesophageal reflux disease)    Glaucoma    IBS (irritable bowel syndrome)    Mixed hyperlipidemia    Osteoporosis    Pacemaker    Persistent atrial fibrillation (HCC)    Pneumonia 2011   Sick sinus syndrome (HCC)    PPM (SJM)   Urinary tract infection    Varicose veins    Past Surgical History:  Procedure Laterality Date   BIOPSY  07/04/2021   Procedure: BIOPSY;  Surgeon: Tressia Danas, MD;  Location: [facility] ENDOSCOPY;  Service: Gastroenterology;;   BREAST LUMPECTOMY  [phone]   Right breast    CARDIAC CATHETERIZATION  1999   Normal coronaries   Cataracts Bilateral    CHOLECYSTECTOMY     COLONOSCOPY W/ BIOPSIES AND POLYPECTOMY  03/2003, 05/2008, 02/20/2011   severe diverticulosis, tubulovillous adenoma polyp, internal and external hemorrhoids 2012: severe diverticulosis, 4 mm polyp, hemorrhoids   CYST REMOVAL TRUNK     under breast = on belly    DILATION AND CURETTAGE OF UTERUS     ESOPHAGOGASTRODUODENOSCOPY (EGD) WITH PROPOFOL N/A 07/04/2021   Procedure: ESOPHAGOGASTRODUODENOSCOPY (EGD) WITH PROPOFOL;  Surgeon: Tressia Danas, MD;  Location: [facility] ENDOSCOPY;  Service: Gastroenterology;  Laterality: N/A;   EYE SURGERY     cataracts - bilateral    Fractured wrist Left 2013   repair   INSERT /  REPLACE / REMOVE PACEMAKER     KNEE ARTHROSCOPY WITH MEDIAL MENISECTOMY Right 10/21/2014   Procedure: RIGHT KNEE ARTHROSCOPY WITH MEDIAL MENISECTOM,lateral menisectomy,synovectomy suprapatellar pouch;  Surgeon: Ranee Gosselin, MD;  Location: [facility] ORS;  Service: Orthopedics;  Laterality: Right;   PACEMAKER GENERATOR CHANGE  06/18/12   SJM [facility] DR RF, Dr Johney Frame   PACEMAKER INSERTION  2003   PERMANENT PACEMAKER GENERATOR CHANGE N/A 06/18/2012   Procedure: PERMANENT PACEMAKER GENERATOR CHANGE;  Surgeon: Hillis Range, MD;  Location: [facility] CATH LAB;  Service: Cardiovascular;  Laterality: N/A;   PPM  GENERATOR CHANGEOUT N/A 09/30/2021   Procedure: PPM GENERATOR CHANGEOUT;  Surgeon: Hillis Range, MD;  Location: [facility] INVASIVE CV LAB;  Service: Cardiovascular;  Laterality: N/A;   TOTAL KNEE ARTHROPLASTY  12/20/2011   Procedure: TOTAL KNEE ARTHROPLASTY;  Surgeon: Jacki Cones, MD;  Location: [facility] ORS;  Service: Orthopedics;  Laterality: Left;   TOTAL KNEE ARTHROPLASTY Right 02/10/2015   Procedure: TOTAL RIGHT KNEE ARTHROPLASTY;  Surgeon: Ranee Gosselin, MD;  Location: [facility] ORS;  Service: Orthopedics;  Laterality: Right;     A IV Location/Drains/Wounds Patient Lines/Drains/Airways Status     Active Line/Drains/Airways     Name Placement date Placement time Site Days   Peripheral IV 04/05/23 22 G 1" Anterior;Left Forearm 04/05/23  2032  Forearm  less than 1   Wound / Incision (Open or Dehisced) 11/02/22 Non-pressure wound Foot Anterior;Right 11/02/22  1959  Foot  154   Wound / Incision (Open or Dehisced) 11/02/22 Non-pressure wound Ankle Anterior;Right 11/02/22  2000  Ankle  154   Wound / Incision (Open or Dehisced) 11/02/22 Non-pressure wound Foot Anterior;Left 11/02/22  2001  Foot  154   Wound / Incision (Open or Dehisced) 11/02/22 Irritant Dermatitis (Moisture Associated Skin Damage) Breast Lower;Right 11/02/22  2002  Breast  154            Intake/Output Last 24 hours No intake or output data in the 24 hours ending 04/05/23 2129  Labs/Imaging Results for orders placed or performed during the hospital encounter of 04/05/23 (from the past 48 hour(s))  Comprehensive metabolic panel     Status: Abnormal   Collection Time: 04/05/23  4:45 PM  Result Value Ref Range   Sodium 132 (L) 135 - 145 mmol/L   Potassium 4.5 3.5 - 5.1 mmol/L    Comment: HEMOLYSIS AT THIS LEVEL MAY AFFECT RESULT   Chloride 93 (L) 98 - 111 mmol/L   CO2 25 22 - 32 mmol/L   Glucose, Bld 105 (H) 70 - 99 mg/dL    Comment: Glucose reference range applies only to samples taken after fasting for at least 8 hours.   BUN  20 8 - 23 mg/dL   Creatinine, Ser 0.98 0.44 - 1.00 mg/dL   Calcium 9.6 8.9 - 11.9 mg/dL   Total Protein 6.4 (L) 6.5 - 8.1 g/dL   Albumin 3.3 (L) 3.5 - 5.0 g/dL   AST 23 15 - 41 U/L    Comment: HEMOLYSIS AT THIS LEVEL MAY AFFECT RESULT   ALT 14 0 - 44 U/L    Comment: HEMOLYSIS AT THIS LEVEL MAY AFFECT RESULT   Alkaline Phosphatase 58 38 - 126 U/L   Total Bilirubin 1.7 (H) 0.3 - 1.2 mg/dL    Comment: HEMOLYSIS AT THIS LEVEL MAY AFFECT RESULT   GFR, Estimated >60 >60 mL/min    Comment: (NOTE) Calculated using the CKD-EPI Creatinine Equation (2021)    Anion gap 14 5 - 15  Comment: Performed at [facility], [address] [place], [name] [address]  CBC with Differential     Status: Abnormal   Collection Time: 04/05/23  4:45 PM  Result Value Ref Range   WBC 6.9 4.0 - 10.5 K/uL   RBC 3.47 (L) 3.87 - 5.11 MIL/uL   Hemoglobin 12.0 12.0 - 15.0 g/dL   HCT 60.4 54.0 - 98.1 %   MCV 105.2 (H) 80.0 - 100.0 fL   MCH 34.6 (H) 26.0 - 34.0 pg   MCHC 32.9 30.0 - 36.0 g/dL   RDW 19.1 47.8 - 29.5 %   Platelets 171 150 - 400 K/uL   nRBC 0.0 0.0 - 0.2 %   Neutrophils Relative % 66 %   Neutro Abs 4.5 1.7 - 7.7 K/uL   Lymphocytes Relative 17 %   Lymphs Abs 1.2 0.7 - 4.0 K/uL   Monocytes Relative 16 %   Monocytes Absolute 1.1 (H) 0.1 - 1.0 K/uL   Eosinophils Relative 1 %   Eosinophils Absolute 0.1 0.0 - 0.5 K/uL   Basophils Relative 0 %   Basophils Absolute 0.0 0.0 - 0.1 K/uL   Immature Granulocytes 0 %   Abs Immature Granulocytes 0.03 0.00 - 0.07 K/uL    Comment: Performed at [facility], [address] [place], [name] [address]  I-Stat Lactic Acid, ED     Status: None   Collection Time: 04/05/23  4:56 PM  Result Value Ref Range   Lactic Acid, Venous 0.9 0.5 - 1.9 mmol/L   DG Foot Complete Right  Result Date: 04/05/2023 CLINICAL DATA:  [id number] Infection [id number] increasing pain, swelling and redness to right foot for 1 week. EXAM: RIGHT FOOT COMPLETE - 3+ VIEW COMPARISON:   None Available. FINDINGS: There is diffuse osteopenia of the visualized osseous structures. No acute fracture or dislocation. No aggressive osseous lesion. Subacute/healing fractures of the neck of fourth and fifth metatarsals. Ankle mortise appears intact. Calcaneal spur noted along the Plantar aponeurosis attachment site. No focal soft tissue swelling. No evidence of air within the soft tissue. Vascular calcifications noted. No radiopaque foreign bodies. IMPRESSION: 1. Osteopenia. No acute fracture or dislocation. Subacute/healing fractures of the neck of fourth and fifth metatarsals. 2. No radiographic evidence of acute osteomyelitis. Correlate clinically to determine the need for additional imaging with more sensitive modality such as MRI. Electronically Signed   By: Jules Schick M.D.   On: 04/05/2023 17:55    Pending Labs Unresulted Labs (From admission, onward)     Start     Ordered   04/06/23 0500  Basic metabolic panel  Tomorrow morning,   R        04/05/23 2128   04/06/23 0500  CBC  Tomorrow morning,   R        04/05/23 2128   04/05/23 2100  C-reactive protein  Once,   R        04/05/23 2100   04/05/23 2025  Sedimentation rate  Once,   R        04/05/23 2025   04/05/23 1621  Blood Cultures x 2 sites  BLOOD CULTURE X 2,   STAT      04/05/23 1621            Vitals/Pain Today's Vitals   04/05/23 1612 04/05/23 1613 04/05/23 1948 04/05/23 2115  BP:   123/81 (!) 102/56  Pulse:   62 (!) 59  Resp:   18 19  Temp:   (!)  97.4 F (36.3 C)   TempSrc:   Oral   SpO2:   100% 98%  Weight:  62.1 kg 62 kg   Height:  5\' 3"  (1.6 m) 5\' 2"  (1.575 m)   PainSc: 10-Worst pain ever  0-No pain     Isolation Precautions No active isolations  Medications Medications  vancomycin (VANCOREADY) IVPB 750 mg/150 mL (750 mg Intravenous New Bag/Given 04/05/23 2113)  acetaminophen (TYLENOL) tablet 650 mg (has no administration in time range)    Or  acetaminophen (TYLENOL) suppository 650 mg (has no  administration in time range)  prochlorperazine (COMPAZINE) injection 10 mg (has no administration in time range)  senna-docusate (Senokot-S) tablet 1 tablet (has no administration in time range)    Mobility walks with device     Focused Assessments   R Recommendations: See Admitting Provider Note  Report given to:   Additional Notes: Right foot cellulitis.  HOH but A+Ox4

## 2023-04-05 NOTE — Progress Notes (Signed)
Pharmacy Antibiotic Note  [name] is a 89 y.o. female admitted on 04/05/2023 with  wound infection .  Pharmacy has been consulted for vancomycin and cefepime dosing.  Plan: Cefepime 2g q12h.  Vancomycin 750mg  q24h eAUC: 467, rounded Scr: 0.8, and IBW.  Follow culture data for de-escalation.  Monitor renal function for dose adjustments as indicated.   Height: 5\' 3"  (160 cm) Weight: 62.1 kg (137 lb) IBW/kg (Calculated) : 52.4  Temp (24hrs), Avg:97.9 F (36.6 C), Min:97.9 F (36.6 C), Max:97.9 F (36.6 C)  Recent Labs  Lab 04/05/23 1645 04/05/23 1656  WBC 6.9  --   CREATININE 0.78  --   LATICACIDVEN  --  0.9    Estimated Creatinine Clearance: 37.9 mL/min (by C-G formula based on SCr of 0.78 mg/dL).    Allergies  Allergen Reactions   Naproxen Hives, Itching, Rash and Other (See Comments)   Iodinated Contrast Media Hives, Itching, Rash and Other (See Comments)    03/13/14: symptoms began within two hours of intrathecal injection (myelogram 03/11/14); on shoulders radiating down to stomach.  Relief with Benadryl.  Told patient she would need to pre-med with Benadryl in the future before receiving this contrast.  Donell Sievert, RN    Thank you for allowing pharmacy to be a part of this patient's care.  Estill Batten, PharmD, BCCCP  04/05/2023 7:14 PM

## 2023-04-05 NOTE — Progress Notes (Addendum)
Pt admitted to unit 5N d/t r foot infection,pt a&o x4, denies pain, no acute distress noted, assisted her to the bathroom stba 1x has sluggish gait. Pt comes from home alone with children to come help with care, LBM 10/23. Pt was encouraged to call when needed, callbell within reach, bed at lowest position   04/05/23 2207  Vitals  Temp 98.3 F (36.8 C)  Temp Source Oral  BP 128/67  MAP (mmHg) 84  BP Location Right Arm  BP Method Automatic  Patient Position (if appropriate) Lying  Pulse Rate (!) 59  Pulse Rate Source Monitor  Level of Consciousness  Level of Consciousness Alert  MEWS COLOR  MEWS Score Color Green  Oxygen Therapy  SpO2 92 %  O2 Device Room Air  Pain Assessment  Pain Scale 0-10  Pain Score 0  MEWS Score  MEWS Temp 0  MEWS Systolic 0  MEWS Pulse 0  MEWS RR 0  MEWS LOC 0  MEWS Score 0

## 2023-04-05 NOTE — ED Provider Notes (Signed)
[name] EMERGENCY DEPARTMENT AT [facility] Provider Note   CSN: [id number] Arrival date & time: 04/05/23  1503     History  Chief Complaint  Patient presents with  . Wound Check    Right foot    [name] is a 89 y.o. female with a past medical history of a flutter on Eliquis, CHF, sick sinus syndrome status post pacemaker, DVTs presents to the emergency room for wound check of right lower extremity.  Patient previously seen for this wound at this ED September 4 discharged on p.o. cefadroxil 14-day course with close follow-up with  vein clinic.  Temporary improvement of symptoms were noted however her foot has become markedly more swollen and painful within the past couple days.  She was at an appointment at the vein clinic today where copious amounts of purulent fluid were reportedly expressed from the wound and she was referred here for further care.  She denies any fevers, chills, or any other systemic symptoms.    Wound Check Pertinent negatives include no chest pain and no shortness of breath.       Home Medications Prior to Admission medications   Medication Sig Start Date End Date Taking? Authorizing Provider  acetaminophen (TYLENOL) 500 MG tablet Take 1,000 mg by mouth every 6 (six) hours as needed for mild pain.    [provider]  amiodarone (PACERONE) 200 MG tablet TAKE ONE (1) TABLET EACH DAY Patient taking differently: Take 200 mg by mouth daily. 12/01/22   Dettinger, Elige Radon, MD  apixaban (ELIQUIS) 5 MG TABS tablet TAKE ONE (1) TABLET BY MOUTH TWO (2) TIMES DAILY Patient taking differently: Take 5 mg by mouth 2 (two) times daily. 12/12/22   Dettinger, Elige Radon, MD  calcium-vitamin D (OSCAL WITH D) 500-200 MG-UNIT tablet Take 1 tablet by mouth daily with breakfast.     [provider]  cyanocobalamin (VITAMIN B12) 1000 MCG/ML injection Inject 1 mL (1,000 mcg total) into the muscle every 30 (thirty) days. 04/10/22    Dettinger, Elige Radon, MD  ezetimibe (ZETIA) 10 MG tablet Take 1 tablet (10 mg total) by mouth daily. 08/08/22   Jonelle Sidle, MD  famotidine (PEPCID) 20 MG tablet Take 20 mg by mouth 2 (two) times daily. 02/21/23   [provider]  furosemide (LASIX) 40 MG tablet Take 1.5 tablets (60 mg total) by mouth daily. May take an extra 20 mg daily as needed for weight gain of 2 lbs/day or 5 lbs/week 11/20/22   Jonelle Sidle, MD  Iron, Ferrous Sulfate, 325 (65 Fe) MG TABS Take 325 mg by mouth daily. 11/10/22   Dettinger, Elige Radon, MD  latanoprost (XALATAN) 0.005 % ophthalmic solution Place 1 drop into both eyes at bedtime.    [provider]  Multiple Vitamin (MULTIVITAMIN) tablet Take 1 tablet by mouth every morning.    [provider]  nadolol (CORGARD) 40 MG tablet Take 0.5 tablets (20 mg total) by mouth 2 (two) times daily. 11/05/22   Shahmehdi, Gemma Payor, MD  nitroGLYCERIN (NITROSTAT) 0.4 MG SL tablet Place 1 tablet (0.4 mg total) under the tongue every 5 (five) minutes as needed for chest pain. DISSOLVE 1 TAB UNDER TOUNGE FOR CHEST PAIN. MAY REPEAT EVERY 5 MINUTES FOR 3 DOSES. IF NO RELIEF CALL 911 OR GO TO ER Strength: 0.4 mg 11/07/22   Sharlene Dory, NP  nystatin (MYCOSTATIN/NYSTOP) powder Apply 1 Application topically 3 (three) times daily. 11/11/22   Raspet, Noberto Retort, PA-C  omeprazole (PRILOSEC) 20 MG capsule TAKE ONE CAPSULE BY MOUTH DAILY 01/23/23   Dettinger, Elige Radon, MD  spironolactone (ALDACTONE) 25 MG tablet TAKE 1/2 TABLET BY MOUTH ONCE DAILY 02/14/23   Jonelle Sidle, MD      Allergies    Naproxen and Iodinated contrast media    Review of Systems   Review of Systems  Constitutional:  Negative for chills and fever.  Respiratory:  Negative for shortness of breath.   Cardiovascular:  Negative for chest pain.  Skin:  Positive for wound.    Physical Exam Updated Vital Signs BP 128/67 (BP Location: Right Arm)   Pulse (!) 59   Temp 98.3 F (36.8 C) (Oral)    Resp 19   Ht 5\' 2"  (1.575 m)   Wt 62 kg   SpO2 92%   BMI 25.00 kg/m  Physical Exam Vitals and nursing note reviewed.  Constitutional:      General: She is not in acute distress.    Appearance: She is well-developed.  HENT:     Head: Normocephalic and atraumatic.  Eyes:     Conjunctiva/sclera: Conjunctivae normal.  Cardiovascular:     Rate and Rhythm: Normal rate and regular rhythm.     Heart sounds: No murmur heard. Pulmonary:     Effort: Pulmonary effort is normal. No respiratory distress.     Breath sounds: Normal breath sounds.  Abdominal:     Palpations: Abdomen is soft.     Tenderness: There is no abdominal tenderness.  Musculoskeletal:        General: No swelling.     Cervical back: Neck supple.     Comments: 2+ pitting edema involving the right lower extremity  Skin:    General: Skin is warm.     Capillary Refill: Capillary refill takes less than 2 seconds.     Comments: Right lower extremity fluctuant wound with surrounding erythema, swelling and warmth as seen in the picture below  Neurological:     Mental Status: She is alert.  Psychiatric:        Mood and Affect: Mood normal.     ED Results / Procedures / Treatments   Labs (all labs ordered are listed, but only abnormal results are displayed) Labs Reviewed  COMPREHENSIVE METABOLIC PANEL - Abnormal; Notable for the following components:      Result Value   Sodium 132 (*)    Chloride 93 (*)    Glucose, Bld 105 (*)    Total Protein 6.4 (*)    Albumin 3.3 (*)    Total Bilirubin 1.7 (*)    All other components within normal limits  CBC WITH DIFFERENTIAL/PLATELET - Abnormal; Notable for the following components:   RBC 3.47 (*)    MCV 105.2 (*)    MCH 34.6 (*)    Monocytes Absolute 1.1 (*)    All other components within normal limits  SEDIMENTATION RATE - Abnormal; Notable for the following components:   Sed Rate 45 (*)    All other components within normal limits  CULTURE, BLOOD (ROUTINE X 2)   CULTURE, BLOOD (ROUTINE X 2)  C-REACTIVE PROTEIN  BASIC METABOLIC PANEL  CBC  I-STAT CG4 LACTIC ACID, ED    EKG None  Radiology DG Foot Complete Right  Result Date: 04/05/2023 CLINICAL DATA:  02725 Infection 98635 increasing pain, swelling and redness to right foot for 1 week. EXAM: RIGHT FOOT COMPLETE - 3+ VIEW COMPARISON:  None Available. FINDINGS: There is diffuse osteopenia of the visualized  osseous structures. No acute fracture or dislocation. No aggressive osseous lesion. Subacute/healing fractures of the neck of fourth and fifth metatarsals. Ankle mortise appears intact. Calcaneal spur noted along the Plantar aponeurosis attachment site. No focal soft tissue swelling. No evidence of air within the soft tissue. Vascular calcifications noted. No radiopaque foreign bodies. IMPRESSION: 1. Osteopenia. No acute fracture or dislocation. Subacute/healing fractures of the neck of fourth and fifth metatarsals. 2. No radiographic evidence of acute osteomyelitis. Correlate clinically to determine the need for additional imaging with more sensitive modality such as MRI. Electronically Signed   By: Jules Schick M.D.   On: 04/05/2023 17:55    Procedures Procedures    Medications Ordered in ED Medications  vancomycin (VANCOREADY) IVPB 750 mg/150 mL (0 mg Intravenous Stopped 04/05/23 2226)  acetaminophen (TYLENOL) tablet 650 mg (has no administration in time range)    Or  acetaminophen (TYLENOL) suppository 650 mg (has no administration in time range)  prochlorperazine (COMPAZINE) injection 10 mg (has no administration in time range)  senna-docusate (Senokot-S) tablet 1 tablet (has no administration in time range)  amiodarone (PACERONE) tablet 200 mg (has no administration in time range)  apixaban (ELIQUIS) tablet 5 mg (has no administration in time range)  ezetimibe (ZETIA) tablet 10 mg (has no administration in time range)  famotidine (PEPCID) tablet 20 mg (has no administration in time  range)  furosemide (LASIX) tablet 60 mg (has no administration in time range)  latanoprost (XALATAN) 0.005 % ophthalmic solution 1 drop (has no administration in time range)  nadolol (CORGARD) tablet 20 mg (has no administration in time range)    ED Course/ Medical Decision Making/ A&P Clinical Course as of 04/05/23 2249  Thu Apr 05, 2023  1904 WBC: 6.9 [JT]  1904 Lactic Acid, Venous: 0.9 [JT]  1955 CBC with Differential(!) Reassuring for no systemic infection [JT]  1956 Comprehensive metabolic panel(!) Mildly hyponatremic. [JT]  1956 Blood cultures, CRP, sed rate still pending at this time.  Foot x-ray does not demonstrate any obvious osteomyelitis.  Given history of prior antibiotic use for this wound will likely admit patient for broader coverage IV antibiotics. [JT]  2248 Sedimentation rate(!) Sed rate notably elevated [JT]    Clinical Course User Index [JT] Halford Decamp, PA-C                                 Medical Decision Making [name] is a 89 y.o. female with a past medical history of a flutter on Eliquis, CHF, sick sinus syndrome status post pacemaker, DVTs presents to the emergency room for wound check of right lower extremity.  Differential includes venous stasis, osteomyelitis, erysipelas, trauma, lymphedema, gangrene.  Patient is accompanied by her son.  Prior ED note was reviewed.  CBC, CMP both reassuring.  Blood cultures, sed rate, CRP still pending at this time.  Foot x-ray does not demonstrate any obvious osteomyelitis.  Given prior treatment with antibiotics plan will be to admit patient for broader IV antibiotic coverage.  Discussed plan with patient and her son and they are in agreement that admission to the hospital would be in her best interest. Reevaluated patient an offered her pain medication, however, patient politely declined at this time.  Cardiac monitoring or critical interventions not indicated.  There are no social determinants of  health.  Amount and/or Complexity of Data Reviewed Labs: ordered. Decision-making details documented in ED Course.  Risk Prescription drug  management. Decision regarding hospitalization.           Final Clinical Impression(s) / ED Diagnoses Final diagnoses:  Cellulitis of right lower extremity    Rx / DC Orders ED Discharge Orders     None         Halford Decamp, PA-C 04/05/23 2235    Sloan Leiter, DO 04/05/23 2301

## 2023-04-05 NOTE — H&P (Signed)
History and Physical    [name] MVH:[id number] DOB: December 15, 1928 DOA: 04/05/2023  PCP: Dettinger, Elige Radon, MD  Patient coming from: Home  I have personally briefly reviewed patient's old medical records in [facility]  Chief Complaint: Right foot wound infection  HPI: [name] is a 89 y.o. female with medical history significant for persistent A-fib on Eliquis, Hx of A-flutter s/p RFA 2003, SSS s/p PPM, HFpEF, HTN, HLD who presented to the ED for evaluation of a right foot wound infection.  Patient previously seen in the ED on 02/14/23 after a fall.  She was noted to have several skin tears on the right side including humerus and right lower extremity.  Workup for traumatic injury was reassuring.  She is felt to have cellulitis surrounding an open wound on her right lower extremity and was treated with 1 week course of cefadroxil.  This apparently initially improved.  Patient states over the last 2 days she has had increased erythema to her right lower leg as well as swelling and pain on the dorsal part of her right foot.  Patient was seen in the vein clinic today where copious amounts of purulent and bloody discharge were reportedly expressed from the right foot wound.  She was sent to the ED for further evaluation.  She denies any fevers, chills, diaphoresis, chest pain.  ED Course  Labs/Imaging on admission: I have personally reviewed following labs and imaging studies.  Initial vitals showed BP 130/73, pulse 58, RR 18, temp 97.9 F, SpO2 95% on room air.  Labs show WBC 6.9, hemoglobin 12.0, platelets 171,000, sodium 132, potassium 4.5 (potentially affected by hemolysis), bicarb 25, BUN 20, creatinine 0.78, serum glucose 105, lactic acid 0.9.  Blood cultures ordered and pending.  Right foot x-ray showed osteopenia, no acute fracture or dislocation.  Subacute/healing fractures of the neck of fourth and fifth metatarsals.  No evidence of acute  osteomyelitis.  Patient was given IV vancomycin, cefepime, Flagyl.  The hospitalist service was consulted to admit for further evaluation and management.  Review of Systems: All systems reviewed and are negative except as documented in history of present illness above.   Past Medical History:  Diagnosis Date   Arthritis    Atrial flutter [facility])    s/p RFA 2003   Breast cancer (HCC)    1980's - lumpectomy only   CHF (congestive heart failure) (HCC)    Colon polyps    Diverticulosis    DVT, lower extremity (HCC) 1998 and 1999   Bilateral   Essential hypertension    Gallstones    GERD (gastroesophageal reflux disease)    Glaucoma    IBS (irritable bowel syndrome)    Mixed hyperlipidemia    Osteoporosis    Pacemaker    Persistent atrial fibrillation (HCC)    Pneumonia 2011   Sick sinus syndrome (HCC)    PPM ([facility])   Urinary tract infection    Varicose veins     Past Surgical History:  Procedure Laterality Date   BIOPSY  07/04/2021   Procedure: BIOPSY;  Surgeon: Tressia Danas, MD;  Location: [facility] ENDOSCOPY;  Service: Gastroenterology;;   BREAST LUMPECTOMY  [phone]   Right breast    CARDIAC CATHETERIZATION  1999   Normal coronaries   Cataracts Bilateral    CHOLECYSTECTOMY     COLONOSCOPY W/ BIOPSIES AND POLYPECTOMY  03/2003, 05/2008, 02/20/2011   severe diverticulosis, tubulovillous adenoma polyp, internal and external hemorrhoids 2012: severe diverticulosis, 4 mm polyp, hemorrhoids  CYST REMOVAL TRUNK     under breast = on belly    DILATION AND CURETTAGE OF UTERUS     ESOPHAGOGASTRODUODENOSCOPY (EGD) WITH PROPOFOL N/A 07/04/2021   Procedure: ESOPHAGOGASTRODUODENOSCOPY (EGD) WITH PROPOFOL;  Surgeon: Tressia Danas, MD;  Location: [facility] ENDOSCOPY;  Service: Gastroenterology;  Laterality: N/A;   EYE SURGERY     cataracts - bilateral    Fractured wrist Left 2013   repair   INSERT / REPLACE / REMOVE PACEMAKER     KNEE ARTHROSCOPY WITH MEDIAL MENISECTOMY Right 10/21/2014    Procedure: RIGHT KNEE ARTHROSCOPY WITH MEDIAL MENISECTOM,lateral menisectomy,synovectomy suprapatellar pouch;  Surgeon: Ranee Gosselin, MD;  Location: [facility] ORS;  Service: Orthopedics;  Laterality: Right;   PACEMAKER GENERATOR CHANGE  06/18/12   [facility] DR RF, Dr Johney Frame   PACEMAKER INSERTION  2003   PERMANENT PACEMAKER GENERATOR CHANGE N/A 06/18/2012   Procedure: PERMANENT PACEMAKER GENERATOR CHANGE;  Surgeon: Hillis Range, MD;  Location: [facility] CATH LAB;  Service: Cardiovascular;  Laterality: N/A;   PPM GENERATOR CHANGEOUT N/A 09/30/2021   Procedure: PPM GENERATOR CHANGEOUT;  Surgeon: Hillis Range, MD;  Location: [facility] INVASIVE CV LAB;  Service: Cardiovascular;  Laterality: N/A;   TOTAL KNEE ARTHROPLASTY  12/20/2011   Procedure: TOTAL KNEE ARTHROPLASTY;  Surgeon: Jacki Cones, MD;  Location: [facility] ORS;  Service: Orthopedics;  Laterality: Left;   TOTAL KNEE ARTHROPLASTY Right 02/10/2015   Procedure: TOTAL RIGHT KNEE ARTHROPLASTY;  Surgeon: Ranee Gosselin, MD;  Location: [facility] ORS;  Service: Orthopedics;  Laterality: Right;    Social History:  reports that she has never smoked. She has never used smokeless tobacco. She reports that she does not drink alcohol and does not use drugs.  Allergies  Allergen Reactions   Naproxen Hives, Itching, Rash and Other (See Comments)   Iodinated Contrast Media Hives, Itching, Rash and Other (See Comments)    03/13/14: symptoms began within two hours of intrathecal injection (myelogram 03/11/14); on shoulders radiating down to stomach.  Relief with Benadryl.  Told patient she would need to pre-med with Benadryl in the future before receiving this contrast.  Donell Sievert, RN    Family History  Problem Relation Age of Onset   Throat cancer Father    Stroke Mother    Alzheimer's disease Mother    Alzheimer's disease Brother    Alzheimer's disease Sister    Colon cancer Maternal Grandfather    Stomach cancer Maternal Grandmother    Heart disease Paternal Grandmother     Heart attack Paternal Grandmother    Prostate cancer Son    Colon cancer Sister    Colon cancer Brother    Heart disease Brother    Atrial fibrillation Brother    Diabetes Brother    Dementia Brother    Parkinson's disease Brother    Prostate cancer Son      Prior to Admission medications   Medication Sig Start Date End Date Taking? Authorizing Provider  acetaminophen (TYLENOL) 500 MG tablet Take 1,000 mg by mouth every 6 (six) hours as needed for mild pain.    [provider]  amiodarone (PACERONE) 200 MG tablet TAKE ONE (1) TABLET EACH DAY Patient taking differently: Take 200 mg by mouth daily. 12/01/22   Dettinger, Elige Radon, MD  apixaban (ELIQUIS) 5 MG TABS tablet TAKE ONE (1) TABLET BY MOUTH TWO (2) TIMES DAILY Patient taking differently: Take 5 mg by mouth 2 (two) times daily. 12/12/22   Dettinger, Elige Radon, MD  calcium-vitamin D (OSCAL WITH D) 500-200  MG-UNIT tablet Take 1 tablet by mouth daily with breakfast.     [provider]  cyanocobalamin (VITAMIN B12) 1000 MCG/ML injection Inject 1 mL (1,000 mcg total) into the muscle every 30 (thirty) days. 04/10/22   Dettinger, Elige Radon, MD  ezetimibe (ZETIA) 10 MG tablet Take 1 tablet (10 mg total) by mouth daily. 08/08/22   Jonelle Sidle, MD  famotidine (PEPCID) 20 MG tablet Take 20 mg by mouth 2 (two) times daily. 02/21/23   [provider]  furosemide (LASIX) 40 MG tablet Take 1.5 tablets (60 mg total) by mouth daily. May take an extra 20 mg daily as needed for weight gain of 2 lbs/day or 5 lbs/week 11/20/22   Jonelle Sidle, MD  Iron, Ferrous Sulfate, 325 (65 Fe) MG TABS Take 325 mg by mouth daily. 11/10/22   Dettinger, Elige Radon, MD  latanoprost (XALATAN) 0.005 % ophthalmic solution Place 1 drop into both eyes at bedtime.    [provider]  Multiple Vitamin (MULTIVITAMIN) tablet Take 1 tablet by mouth every morning.    [provider]  nadolol (CORGARD) 40 MG tablet Take 0.5 tablets (20  mg total) by mouth 2 (two) times daily. 11/05/22   Shahmehdi, Gemma Payor, MD  nitroGLYCERIN (NITROSTAT) 0.4 MG SL tablet Place 1 tablet (0.4 mg total) under the tongue every 5 (five) minutes as needed for chest pain. DISSOLVE 1 TAB UNDER TOUNGE FOR CHEST PAIN. MAY REPEAT EVERY 5 MINUTES FOR 3 DOSES. IF NO RELIEF CALL 911 OR GO TO ER Strength: 0.4 mg 11/07/22   Sharlene Dory, NP  nystatin (MYCOSTATIN/NYSTOP) powder Apply 1 Application topically 3 (three) times daily. 11/11/22   Raspet, Noberto Retort, PA-C  omeprazole (PRILOSEC) 20 MG capsule TAKE ONE CAPSULE BY MOUTH DAILY 01/23/23   Dettinger, Elige Radon, MD  spironolactone (ALDACTONE) 25 MG tablet TAKE 1/2 TABLET BY MOUTH ONCE DAILY 02/14/23   Jonelle Sidle, MD    Physical Exam: Vitals:   04/05/23 1600 04/05/23 1613 04/05/23 1948 04/05/23 2115  BP: 130/73  123/81 (!) 102/56  Pulse: (!) 58  62 (!) 59  Resp: 18  18 19   Temp: 97.9 F (36.6 C)  (!) 97.4 F (36.3 C)   TempSrc:   Oral   SpO2: 95%  100% 98%  Weight:  62.1 kg 62 kg   Height:  5\' 3"  (1.6 m) 5\' 2"  (1.575 m)    Constitutional: Elderly woman resting in bed, NAD, calm, comfortable Eyes: EOMI, lids and conjunctivae normal ENMT: Mucous membranes are moist. Posterior pharynx clear of any exudate or lesions.Normal dentition.  Neck: normal, supple, no masses. Respiratory: clear to auscultation bilaterally, no wheezing, no crackles. Normal respiratory effort. No accessory muscle use.  Cardiovascular: Paced rhythm, no murmurs / rubs / gallops.  Trace right lower extremity edema. 2+ pedal pulses.  PPM in place. Abdomen: no tenderness, no masses palpated.  Musculoskeletal: no clubbing / cyanosis. No joint deformity upper and lower extremities. Good ROM, no contractures. Normal muscle tone.  Skin: Erythema of the right foot with wound distal dorsal right foot with dried blood as pictured below Neurologic: Sensation intact. Strength equal bilaterally. Psychiatric: Normal judgment and insight. Alert and  oriented x 3. Normal mood.    EKG: Not performed.  Assessment/Plan Principal Problem:   Cellulitis of right foot Active Problems:   Hyperlipidemia   Essential hypertension, benign   Sick sinus syndrome (HCC)   Persistent atrial fibrillation (HCC)   Chronic anticoagulation   Chronic heart failure  with preserved ejection fraction (HFpEF) (HCC)   [name] is a 89 y.o. female with medical history significant for persistent A-fib on Eliquis, Hx of A-flutter s/p RFA 2003, SSS s/p PPM, HFpEF, HTN, HLD who is admitted for right foot wound infection with cellulitis.  Assessment and Plan: Right foot wound infection with cellulitis: Copious amount of purulent discharge reportedly expressed from the wound in the pain clinic PTA.  Overall appears to be a localized infection without evidence of sepsis. -Continue IV vancomycin -Consult to wound care  Persistent atrial fibrillation SSS s/p PPM: Paced rhythm on telemetry.  Continue home amiodarone, nadolol, Eliquis.  Chronic HFpEF: Euvolemic on admit.  Continue home Lasix 60 mg daily.  Hypertension: Continue home nadolol, hold spironolactone with borderline low BP on admission.  Hyperlipidemia: Continue Zetia.   DVT prophylaxis:  apixaban (ELIQUIS) tablet 5 mg   Code Status: DNR, confirmed with patient on admission Family Communication: Discussed with patient, she has discussed with family Disposition Plan: From home and likely discharge to home pending clinical progress Consults called: None Severity of Illness: The appropriate patient status for this patient is OBSERVATION. Observation status is judged to be reasonable and necessary in order to provide the required intensity of service to ensure the patient's safety. The patient's presenting symptoms, physical exam findings, and initial radiographic and laboratory data in the context of their medical condition is felt to place them at decreased risk for further clinical  deterioration. Furthermore, it is anticipated that the patient will be medically stable for discharge from the hospital within 2 midnights of admission.   Darreld Mclean MD [facility]  If 7PM-7AM, please contact night-coverage [email or web address]  04/05/2023, 9:35 PM

## 2023-04-05 NOTE — Hospital Course (Addendum)
[name] is a 89 y.o. female with medical history significant for persistent A-fib on Eliquis, Hx of A-flutter s/p RFA 2003, SSS s/p PPM, HFpEF, HTN, HLD who is admitted for right foot wound infection with cellulitis.

## 2023-04-05 NOTE — ED Triage Notes (Signed)
Pt has had wound on right foot that has been getting worse over the last couple of weeks. Foot is red and swollen. Pt has been seen for wound and wound has been cultured. Pt is ambulatory but with pain.

## 2023-04-06 DIAGNOSIS — L03115 Cellulitis of right lower limb: Principal | ICD-10-CM

## 2023-04-06 LAB — CBC
HCT: 34.8 % — ABNORMAL LOW (ref 36.0–46.0)
Hemoglobin: 11.7 g/dL — ABNORMAL LOW (ref 12.0–15.0)
MCH: 34.6 pg — ABNORMAL HIGH (ref 26.0–34.0)
MCHC: 33.6 g/dL (ref 30.0–36.0)
MCV: 103 fL — ABNORMAL HIGH (ref 80.0–100.0)
Platelets: 152 10*3/uL (ref 150–400)
RBC: 3.38 MIL/uL — ABNORMAL LOW (ref 3.87–5.11)
RDW: 14.7 % (ref 11.5–15.5)
WBC: 5.2 10*3/uL (ref 4.0–10.5)
nRBC: 0 % (ref 0.0–0.2)

## 2023-04-06 LAB — BASIC METABOLIC PANEL
Anion gap: 8 (ref 5–15)
BUN: 16 mg/dL (ref 8–23)
CO2: 28 mmol/L (ref 22–32)
Calcium: 8.9 mg/dL (ref 8.9–10.3)
Chloride: 95 mmol/L — ABNORMAL LOW (ref 98–111)
Creatinine, Ser: 0.68 mg/dL (ref 0.44–1.00)
GFR, Estimated: >60 mL/min (ref >60)
Glucose, Bld: 93 mg/dL (ref 70–99)
Potassium: 4 mmol/L (ref 3.5–5.1)
Sodium: 131 mmol/L — ABNORMAL LOW (ref 135–145)

## 2023-04-06 MED ORDER — AMOXICILLIN 500 MG PO CAPS: 500.0000 mg | ORAL_CAPSULE | Freq: Three times a day (TID) | ORAL | 0 refills | Status: AC

## 2023-04-06 MED ORDER — SACCHAROMYCES BOULARDII 250 MG PO CAPS: 250.0000 mg | ORAL_CAPSULE | Freq: Two times a day (BID) | ORAL | 0 refills | Status: AC

## 2023-04-06 MED ORDER — DOXYCYCLINE HYCLATE 100 MG PO TABS
100.0000 mg | ORAL_TABLET | Freq: Two times a day (BID) | ORAL | 0 refills | Status: AC
Start: 2023-04-06 — End: 2023-04-13

## 2023-04-06 NOTE — Progress Notes (Signed)
Discharge instructions given. Patient verbalized understanding and all questions were answered.  ?

## 2023-04-06 NOTE — Consult Note (Signed)
WOC Nurse Consult Note: patient admitted for cellulitis R foot, receiving IV antibiotics; has history of recurrent ulcerations to bilateral legs which she says her doctor told her was her "veins"  Reason for Consult: wound dorsal R foot  Wound type: 1. Full thickness infectious dorsal R foot  2.  Full thickness L lower leg x 2 r/t venous insufficiency  Pressure Injury POA: NA  Measurement: 1.  R dorsal foot 2 cm x 2 cm x 0.1 cm 50% dark hemorrhagic tissue  25% yellow 25% red moist  2.  L lower anterior leg full thickness ulcer 1 cm x 1 cm x 0.1 cm 75% pink moist 25% yellow; L medial lower leg 0.5 cm x 0.5 cm x 0.1 cm 75% yellow 25% pink moist  Wound bed: as above   Drainage: serosanguinous R foot and L anterior lower leg, tan exudate L medial lower legs   Periwound: R foot erythematous, edematous; bilateral legs with evidence of old healed venous ulcers (scabbed/scarred areas)  Dressing procedure/placement/frequency: Clean R dorsal foot and L lower leg wounds with Vashe wound cleanser Hart [place] [phone]), apply a small piece of Vashe moistened (not saturated) gauze to wound beds twice daily, cover with dry gauze and secure with silicone foam or Kerlix roll gauze.    Patient may be discharged today, spoke with bedside nurse about educating patient and son how to perform wound care at home and sending home bottle of Vashe.    POC discussed with patient and primary MD. WOC team will not follow.  Re-consult if further needs arise.   Thank you,    Priscella Mann MSN, RN-BC, [employer] [phone]

## 2023-04-06 NOTE — Plan of Care (Signed)

## 2023-04-06 NOTE — Discharge Summary (Signed)
[facility]  Physician Discharge Summary   Patient ID: [name] MRN: [id number] DOB/AGE: [birth year] 89 y.o.  Admit date: 04/05/2023 Discharge date: 04/06/2023    PCP: Dettinger, Elige Radon, MD  DISCHARGE DIAGNOSES:    Cellulitis of right foot   Hyperlipidemia   Essential hypertension, benign   Sick sinus syndrome (HCC)   Persistent atrial fibrillation (HCC)   Chronic anticoagulation   Chronic heart failure with preserved ejection fraction (HFpEF) (HCC)   RECOMMENDATIONS FOR OUTPATIENT FOLLOW UP: Outpatient follow-up with PCP   Home Health: None Equipment/Devices: None  CODE STATUS: Full code  DISCHARGE CONDITION: fair  Diet recommendation: As before  INITIAL HISTORY: As per H&P: 89 y.o. female with medical history significant for persistent A-fib on Eliquis, Hx of A-flutter s/p RFA 2003, SSS s/p PPM, HFpEF, HTN, HLD who presented to the ED for evaluation of a right foot wound infection. Patient previously seen in the ED on 02/14/23 after a fall.  She was noted to have several skin tears on the right side including humerus and right lower extremity.  Workup for traumatic injury was reassuring.  She is felt to have cellulitis surrounding an open wound on her right lower extremity and was treated with 1 week course of cefadroxil.  This apparently initially improved. Patient states over the last 2 days she has had increased erythema to her right lower leg as well as swelling and pain on the dorsal part of her right foot.  Patient was seen in the vein clinic today where copious amounts of purulent and bloody discharge were reportedly expressed from the right foot wound.  She was sent to the ED for further evaluation. She denies any fevers, chills, diaphoresis, chest pain. Labs show WBC 6.9, hemoglobin 12.0, platelets 171,000, sodium 132, potassium 4.5 (potentially affected by hemolysis), bicarb 25, BUN 20, creatinine 0.78, serum glucose 105, lactic acid 0.9.  Blood  cultures ordered and pending. Right foot x-ray showed osteopenia, no acute fracture or dislocation.  Subacute/healing fractures of the neck of fourth and fifth metatarsals.  No evidence of acute osteomyelitis. Patient was given IV vancomycin, cefepime, Flagyl.  The hospitalist service was consulted to admit for further evaluation and management.   HOSPITAL COURSE:   Patient was started on vancomycin.  Improvement noted in her cellulitic changes on the right foot.  Transitioned to amoxicillin and doxycycline.  Patient wants to go home.  Discussed with her son.  She has ambulated without difficulty.  Seen by wound care and wound care instructions given to the patient and family.  Her other medical issues including atrial fibrillation, essential hypertension, hyperlipidemia, chronic diastolic CHF are all stable.   Patient is stable for discharge today.   PERTINENT LABS:  The results of significant diagnostics from this hospitalization (including imaging, microbiology, ancillary and laboratory) are listed below for reference.    Microbiology: Recent Results (from the past 240 hour(s))  Blood Cultures x 2 sites     Status: None (Preliminary result)   Collection Time: 04/05/23  7:15 AM   Specimen: BLOOD  Result Value Ref Range Status   Specimen Description BLOOD LEFT ANTECUBITAL  Final   Special Requests   Final    BOTTLES DRAWN AEROBIC AND ANAEROBIC Blood Culture adequate volume   Culture   Final    NO GROWTH 2 DAYS Performed at [facility], [address], [place], [name] [address]    Report Status PENDING  Incomplete  Blood Cultures x 2 sites  Status: None (Preliminary result)   Collection Time: 04/05/23  8:25 PM   Specimen: BLOOD  Result Value Ref Range Status   Specimen Description BLOOD SITE NOT SPECIFIED  Final   Special Requests   Final    BOTTLES DRAWN AEROBIC AND ANAEROBIC Blood Culture results may not be optimal due to an inadequate volume of blood received in  culture bottles   Culture   Final    NO GROWTH 2 DAYS Performed at [facility], [address] [place], [name] [address]    Report Status PENDING  Incomplete     Labs:   Basic Metabolic Panel: Recent Labs  Lab 04/05/23 1645 04/06/23 0820  NA 132* 131*  K 4.5 4.0  CL 93* 95*  CO2 25 28  GLUCOSE 105* 93  BUN 20 16  CREATININE 0.78 0.68  CALCIUM 9.6 8.9   Liver Function Tests: Recent Labs  Lab 04/05/23 1645  AST 23  ALT 14  ALKPHOS 58  BILITOT 1.7*  PROT 6.4*  ALBUMIN 3.3*    CBC: Recent Labs  Lab 04/05/23 1645 04/06/23 0820  WBC 6.9 5.2  NEUTROABS 4.5  --   HGB 12.0 11.7*  HCT 36.5 34.8*  MCV 105.2* 103.0*  PLT 171 152     IMAGING STUDIES DG Foot Complete Right  Result Date: 04/05/2023 CLINICAL DATA:  02725 Infection 36644 increasing pain, swelling and redness to right foot for 1 week. EXAM: RIGHT FOOT COMPLETE - 3+ VIEW COMPARISON:  None Available. FINDINGS: There is diffuse osteopenia of the visualized osseous structures. No acute fracture or dislocation. No aggressive osseous lesion. Subacute/healing fractures of the neck of fourth and fifth metatarsals. Ankle mortise appears intact. Calcaneal spur noted along the Plantar aponeurosis attachment site. No focal soft tissue swelling. No evidence of air within the soft tissue. Vascular calcifications noted. No radiopaque foreign bodies. IMPRESSION: 1. Osteopenia. No acute fracture or dislocation. Subacute/healing fractures of the neck of fourth and fifth metatarsals. 2. No radiographic evidence of acute osteomyelitis. Correlate clinically to determine the need for additional imaging with more sensitive modality such as MRI. Electronically Signed   By: Jules Schick M.D.   On: 04/05/2023 17:55   CUP PACEART REMOTE DEVICE CHECK  Result Date: 04/02/2023 Scheduled remote reviewed. Normal device function.  Occasional out of range atrial capture thresholds per trends, autocapture is enabled. Next remote 91  days. - CS, CVRS   DISCHARGE EXAMINATION: Vitals:   04/06/23 0430 04/06/23 0500 04/06/23 0858 04/06/23 1049  BP: 108/63  114/75   Pulse: (!) 59  67 64  Resp: 18  18   Temp: 97.6 F (36.4 C)  98.3 F (36.8 C)   TempSrc: Oral     SpO2: 92%  (!) 59%   Weight:  65.5 kg    Height:       General appearance: Awake alert.  In no distress Resp: Clear to auscultation bilaterally.  Normal effort Cardio: S1-S2 is normal regular.  No S3-S4.  No rubs murmurs or bruit GI: Abdomen is soft.  Nontender nondistended.  Bowel sounds are present normal.  No masses organomegaly Extremities: Mild erythema of the right forefoot with wound.  No active drainage noted.   DISPOSITION: Home  Discharge Instructions     Call MD for:  difficulty breathing, headache or visual disturbances   Complete by: As directed    Call MD for:  persistant dizziness or light-headedness   Complete by: As directed    Call MD for:  persistant nausea and vomiting   Complete by: As directed    Call MD for:  redness, tenderness, or signs of infection (pain, swelling, redness, odor or green/yellow discharge around incision site)   Complete by: As directed    Call MD for:  severe uncontrolled pain   Complete by: As directed    Call MD for:  temperature >100.4   Complete by: As directed    Diet - low sodium heart healthy   Complete by: As directed    Discharge instructions   Complete by: As directed    Please take your medications as prescribed.  Please be sure to follow-up with your primary care provider next week.  Seek attention if you develop fever chills nausea vomiting worsening pain in the foot.  You were cared for by a hospitalist during your hospital stay. If you have any questions about your discharge medications or the care you received while you were in the hospital after you are discharged, you can call the unit and asked to speak with the hospitalist on call if the hospitalist that took care of you is not  available. Once you are discharged, your primary care physician will handle any further medical issues. Please note that NO REFILLS for any discharge medications will be authorized once you are discharged, as it is imperative that you return to your primary care physician (or establish a relationship with a primary care physician if you do not have one) for your aftercare needs so that they can reassess your need for medications and monitor your lab values. If you do not have a primary care physician, you can call [phone] for a physician referral.   Discharge wound care:   Complete by: As directed    Wound care  2 times daily      Comments: Clean R dorsal foot and L lower leg wounds with Vashe wound cleanser Hart [place] [phone]), apply a small piece of Vashe moistened (not saturated) gauze to wound beds twice daily, cover with dry gauze and secure with silicone foam or Kerlix roll gauze.   Increase activity slowly   Complete by: As directed          Allergies as of 04/06/2023       Reactions   Naproxen Hives, Itching, Rash, Other (See Comments)   Iodinated Contrast Media Hives, Itching, Rash, Other (See Comments)   03/13/14: symptoms began within two hours of intrathecal injection (myelogram 03/11/14); on shoulders radiating down to stomach.  Relief with Benadryl.  Told patient she would need to pre-med with Benadryl in the future before receiving this contrast.  Donell Sievert, RN        Medication List     TAKE these medications    acetaminophen 500 MG tablet Commonly known as: TYLENOL Take 1,000 mg by mouth every 6 (six) hours as needed for mild pain.   amiodarone 200 MG tablet Commonly known as: PACERONE TAKE ONE (1) TABLET EACH DAY What changed: See the new instructions.   amoxicillin 500 MG capsule Commonly known as: AMOXIL Take 1 capsule (500 mg total) by mouth 3 (three) times daily for 7 days.   calcium-vitamin D 500-200 MG-UNIT tablet Commonly known as: OSCAL WITH D Take 1  tablet by mouth daily with breakfast.   doxycycline 100 MG tablet Commonly known as: VIBRA-TABS Take 1 tablet (100 mg total) by mouth 2 (two) times daily for 7 days.   Eliquis 5 MG Tabs tablet Generic drug: apixaban TAKE ONE (1) TABLET  BY MOUTH TWO (2) TIMES DAILY What changed: See the new instructions.   ezetimibe 10 MG tablet Commonly known as: ZETIA Take 1 tablet (10 mg total) by mouth daily.   famotidine 20 MG tablet Commonly known as: PEPCID Take 20 mg by mouth 2 (two) times daily.   furosemide 40 MG tablet Commonly known as: LASIX Take 1.5 tablets (60 mg total) by mouth daily. May take an extra 20 mg daily as needed for weight gain of 2 lbs/day or 5 lbs/week   Iron (Ferrous Sulfate) 325 (65 Fe) MG Tabs Take 325 mg by mouth daily.   latanoprost 0.005 % ophthalmic solution Commonly known as: XALATAN Place 1 drop into both eyes at bedtime.   multivitamin tablet Take 1 tablet by mouth every morning.   nadolol 40 MG tablet Commonly known as: CORGARD Take 0.5 tablets (20 mg total) by mouth 2 (two) times daily.   nitroGLYCERIN 0.4 MG SL tablet Commonly known as: NITROSTAT Place 1 tablet (0.4 mg total) under the tongue every 5 (five) minutes as needed for chest pain. DISSOLVE 1 TAB UNDER TOUNGE FOR CHEST PAIN. MAY REPEAT EVERY 5 MINUTES FOR 3 DOSES. IF NO RELIEF CALL 911 OR GO TO ER Strength: 0.4 mg   nystatin powder Commonly known as: MYCOSTATIN/NYSTOP Apply 1 Application topically 3 (three) times daily.   omeprazole 20 MG capsule Commonly known as: PRILOSEC TAKE ONE CAPSULE BY MOUTH DAILY   saccharomyces boulardii 250 MG capsule Commonly known as: Florastor Take 1 capsule (250 mg total) by mouth 2 (two) times daily for 10 days.   spironolactone 25 MG tablet Commonly known as: ALDACTONE TAKE 1/2 TABLET BY MOUTH ONCE DAILY               Discharge Care Instructions  (From admission, onward)           Start     Ordered   04/06/23 0000  Discharge  wound care:       Comments: Wound care  2 times daily      Comments: Clean R dorsal foot and L lower leg wounds with Vashe wound cleanser Hart [place] [phone]), apply a small piece of Vashe moistened (not saturated) gauze to wound beds twice daily, cover with dry gauze and secure with silicone foam or Kerlix roll gauze.   04/06/23 1044              Follow-up Information     Dettinger, Elige Radon, MD Follow up.   Specialties: Family Medicine, Cardiology Contact information: [address] [name] [address] [phone]                 TOTAL DISCHARGE TIME: 35 minutes  Sanel Stemmer Rito Ehrlich  [facility] Pager on [email or web address]  04/07/2023, 12:24 PM

## 2023-04-10 DIAGNOSIS — L97519 Non-pressure chronic ulcer of other part of right foot with unspecified severity: Secondary | ICD-10-CM | POA: Diagnosis not present

## 2023-04-10 DIAGNOSIS — R600 Localized edema: Secondary | ICD-10-CM | POA: Diagnosis not present

## 2023-04-11 LAB — CULTURE, BLOOD (ROUTINE X 2)
Culture: NO GROWTH
Culture: NO GROWTH
Special Requests: ADEQUATE

## 2023-04-12 ENCOUNTER — Ambulatory Visit: Payer: Medicare Other | Admitting: Family Medicine

## 2023-04-12 ENCOUNTER — Ambulatory Visit: Payer: Medicare Other | Attending: Nurse Practitioner | Admitting: Nurse Practitioner

## 2023-04-12 ENCOUNTER — Encounter: Payer: Self-pay | Admitting: Nurse Practitioner

## 2023-04-12 VITALS — BP 106/60 | Ht 63.0 in | Wt 138.5 lb

## 2023-04-12 DIAGNOSIS — Z950 Presence of cardiac pacemaker: Secondary | ICD-10-CM

## 2023-04-12 DIAGNOSIS — I5032 Chronic diastolic (congestive) heart failure: Secondary | ICD-10-CM | POA: Diagnosis not present

## 2023-04-12 DIAGNOSIS — E782 Mixed hyperlipidemia: Secondary | ICD-10-CM | POA: Diagnosis not present

## 2023-04-12 DIAGNOSIS — I6523 Occlusion and stenosis of bilateral carotid arteries: Secondary | ICD-10-CM | POA: Diagnosis not present

## 2023-04-12 DIAGNOSIS — I495 Sick sinus syndrome: Secondary | ICD-10-CM

## 2023-04-12 DIAGNOSIS — I480 Paroxysmal atrial fibrillation: Secondary | ICD-10-CM

## 2023-04-12 DIAGNOSIS — I4892 Unspecified atrial flutter: Secondary | ICD-10-CM | POA: Diagnosis not present

## 2023-04-12 DIAGNOSIS — Z86718 Personal history of other venous thrombosis and embolism: Secondary | ICD-10-CM | POA: Diagnosis not present

## 2023-04-12 NOTE — Patient Instructions (Addendum)
Medication Instructions:  Continue all current medications.  Labwork: none  Testing/Procedures: none  Follow-Up: 3 months   Any Other Special Instructions Will Be Listed Below (If Applicable).  If you need a refill on your cardiac medications before your next appointment, please call your pharmacy.  

## 2023-04-15 ENCOUNTER — Encounter: Payer: Self-pay | Admitting: Nurse Practitioner

## 2023-04-17 DIAGNOSIS — L97519 Non-pressure chronic ulcer of other part of right foot with unspecified severity: Secondary | ICD-10-CM | POA: Diagnosis not present

## 2023-04-17 NOTE — Progress Notes (Signed)
Remote pacemaker transmission.   

## 2023-04-18 ENCOUNTER — Ambulatory Visit: Payer: Medicare Other | Admitting: Family Medicine

## 2023-04-19 ENCOUNTER — Encounter: Payer: Self-pay | Admitting: Family Medicine

## 2023-05-22 ENCOUNTER — Encounter: Payer: Self-pay | Admitting: Nurse Practitioner

## 2023-05-22 DIAGNOSIS — Z0279 Encounter for issue of other medical certificate: Secondary | ICD-10-CM

## 2023-05-25 ENCOUNTER — Ambulatory Visit: Payer: Medicare Other | Admitting: Family Medicine

## 2023-05-30 ENCOUNTER — Telehealth: Payer: Self-pay | Admitting: Nurse Practitioner

## 2023-05-30 NOTE — Telephone Encounter (Signed)
Received FMLA from son Marcial Pacas. Forms accepted and completed by Sharlene Dory ,NP.  05/30/2023 faxed information [phone]  05/30/2023 Completed form faxed to Billing for completion.

## 2023-05-31 ENCOUNTER — Encounter (HOSPITAL_BASED_OUTPATIENT_CLINIC_OR_DEPARTMENT_OTHER): Attending: Internal Medicine | Admitting: Internal Medicine

## 2023-05-31 ENCOUNTER — Encounter: Payer: Self-pay | Admitting: Nurse Practitioner

## 2023-05-31 ENCOUNTER — Ambulatory Visit (HOSPITAL_BASED_OUTPATIENT_CLINIC_OR_DEPARTMENT_OTHER): Payer: Medicare Other | Admitting: Internal Medicine

## 2023-05-31 DIAGNOSIS — M8000XA Age-related osteoporosis with current pathological fracture, unspecified site, initial encounter for fracture: Secondary | ICD-10-CM | POA: Insufficient documentation

## 2023-05-31 DIAGNOSIS — L97828 Non-pressure chronic ulcer of other part of left lower leg with other specified severity: Secondary | ICD-10-CM | POA: Diagnosis not present

## 2023-05-31 DIAGNOSIS — H409 Unspecified glaucoma: Secondary | ICD-10-CM | POA: Diagnosis not present

## 2023-05-31 DIAGNOSIS — S91301A Unspecified open wound, right foot, initial encounter: Secondary | ICD-10-CM | POA: Diagnosis not present

## 2023-05-31 DIAGNOSIS — E785 Hyperlipidemia, unspecified: Secondary | ICD-10-CM | POA: Insufficient documentation

## 2023-05-31 DIAGNOSIS — I110 Hypertensive heart disease with heart failure: Secondary | ICD-10-CM | POA: Insufficient documentation

## 2023-05-31 DIAGNOSIS — I480 Paroxysmal atrial fibrillation: Secondary | ICD-10-CM | POA: Insufficient documentation

## 2023-05-31 DIAGNOSIS — M1990 Unspecified osteoarthritis, unspecified site: Secondary | ICD-10-CM | POA: Insufficient documentation

## 2023-05-31 DIAGNOSIS — I5032 Chronic diastolic (congestive) heart failure: Secondary | ICD-10-CM | POA: Diagnosis not present

## 2023-05-31 DIAGNOSIS — I87321 Chronic venous hypertension (idiopathic) with inflammation of right lower extremity: Secondary | ICD-10-CM | POA: Diagnosis not present

## 2023-05-31 DIAGNOSIS — L97518 Non-pressure chronic ulcer of other part of right foot with other specified severity: Secondary | ICD-10-CM | POA: Diagnosis not present

## 2023-05-31 DIAGNOSIS — L97818 Non-pressure chronic ulcer of other part of right lower leg with other specified severity: Secondary | ICD-10-CM | POA: Diagnosis not present

## 2023-05-31 DIAGNOSIS — Z7901 Long term (current) use of anticoagulants: Secondary | ICD-10-CM | POA: Insufficient documentation

## 2023-05-31 DIAGNOSIS — I872 Venous insufficiency (chronic) (peripheral): Secondary | ICD-10-CM | POA: Insufficient documentation

## 2023-05-31 DIAGNOSIS — L97512 Non-pressure chronic ulcer of other part of right foot with fat layer exposed: Secondary | ICD-10-CM | POA: Diagnosis not present

## 2023-05-31 DIAGNOSIS — S81802A Unspecified open wound, left lower leg, initial encounter: Secondary | ICD-10-CM | POA: Diagnosis not present

## 2023-06-07 ENCOUNTER — Encounter (HOSPITAL_BASED_OUTPATIENT_CLINIC_OR_DEPARTMENT_OTHER): Admitting: General Surgery

## 2023-06-07 DIAGNOSIS — L97518 Non-pressure chronic ulcer of other part of right foot with other specified severity: Secondary | ICD-10-CM | POA: Diagnosis not present

## 2023-06-07 DIAGNOSIS — I872 Venous insufficiency (chronic) (peripheral): Secondary | ICD-10-CM | POA: Diagnosis not present

## 2023-06-07 DIAGNOSIS — M1990 Unspecified osteoarthritis, unspecified site: Secondary | ICD-10-CM | POA: Diagnosis not present

## 2023-06-07 DIAGNOSIS — L97828 Non-pressure chronic ulcer of other part of left lower leg with other specified severity: Secondary | ICD-10-CM | POA: Diagnosis not present

## 2023-06-07 DIAGNOSIS — I5032 Chronic diastolic (congestive) heart failure: Secondary | ICD-10-CM | POA: Diagnosis not present

## 2023-06-07 DIAGNOSIS — I110 Hypertensive heart disease with heart failure: Secondary | ICD-10-CM | POA: Diagnosis not present

## 2023-06-07 DIAGNOSIS — H409 Unspecified glaucoma: Secondary | ICD-10-CM | POA: Diagnosis not present

## 2023-06-07 DIAGNOSIS — Z7901 Long term (current) use of anticoagulants: Secondary | ICD-10-CM | POA: Diagnosis not present

## 2023-06-07 DIAGNOSIS — E785 Hyperlipidemia, unspecified: Secondary | ICD-10-CM | POA: Diagnosis not present

## 2023-06-07 DIAGNOSIS — I480 Paroxysmal atrial fibrillation: Secondary | ICD-10-CM | POA: Diagnosis not present

## 2023-06-07 DIAGNOSIS — L97818 Non-pressure chronic ulcer of other part of right lower leg with other specified severity: Secondary | ICD-10-CM | POA: Diagnosis not present

## 2023-06-07 DIAGNOSIS — M8000XA Age-related osteoporosis with current pathological fracture, unspecified site, initial encounter for fracture: Secondary | ICD-10-CM | POA: Diagnosis not present

## 2023-06-07 NOTE — Progress Notes (Signed)
[name], [name] ([id number]) [id number] Page 1 of 14 Visit Report for 05/31/2023 Allergy List Details Patient Name: Date of Service: [name] 05/31/2023 12:30 PM Medical Record Number: [id number] Patient Account Number: [id number] Date of Birth/Sex: Treating RN: 03/02/1929 (89 y.o. Arta Silence Primary Care Royden Bulman: Dettinger, Ivin Booty Other Clinician: Referring Kalan Yeley: Treating Jaleigha Deane/Extender: Baltazar Najjar Dettinger, Larence Penning in Treatment: 0 Allergies Active Allergies naproxen Iodinated Contrast Media Allergy Notes Electronic Signature(s) Signed: 06/07/2023 4:00:57 PM By: Thayer Dallas Entered By: Thayer Dallas on 05/31/2023 09:50:59 -------------------------------------------------------------------------------- Arrival Information Details Patient Name: Date of Service: [name], [name]. 05/31/2023 12:30 PM Medical Record Number: [id number] Patient Account Number: [id number] Date of Birth/Sex: Treating RN: 07/23/1928 (89 y.o. F) Primary Care Novali Vollman: Dettinger, Ivin Booty Other Clinician: Referring Rileigh Kawashima: Treating Nil Bolser/Extender: Claudean Severance, Larence Penning in Treatment: 0 Visit Information Patient Arrived: Wheel Chair Arrival Time: 12:43 Accompanied By: son and daughter in law Transfer Assistance: None Patient Identification Verified: Yes Secondary Verification Process Completed: Yes Patient Requires Transmission-Based Precautions: No Patient Has Alerts: Yes Patient Alerts: Patient on Blood Thinner Electronic Signature(s) Signed: 06/07/2023 4:00:57 PM By: Thayer Dallas Entered By: Thayer Dallas on 05/31/2023 09:50:19 [name], [name] ([id number]) [id number] Page 2 of 14 -------------------------------------------------------------------------------- Clinic Level of Care Assessment Details Patient Name: Date of Service: [name] 05/31/2023  12:30 PM Medical Record Number: [id number] Patient Account Number: [id number] Date of Birth/Sex: Treating RN: 1929-04-13 (89 y.o. [name] Primary Care Iara Monds: Dettinger, Ivin Booty Other Clinician: Referring Ryeleigh Santore: Treating Raymont Andreoni/Extender: Baltazar Najjar Dettinger, Larence Penning in Treatment: 0 Clinic Level of Care Assessment Items TOOL 1 Quantity Score X- 1 0 Use when EandM and Procedure is performed on INITIAL visit ASSESSMENTS - Nursing Assessment / Reassessment X- 1 20 General Physical Exam (combine w/ comprehensive assessment (listed just below) when performed on new pt. evals) X- 1 25 Comprehensive Assessment (HX, ROS, Risk Assessments, Wounds Hx, etc.) ASSESSMENTS - Wound and Skin Assessment / Reassessment []  - 0 Dermatologic / Skin Assessment (not related to wound area) ASSESSMENTS - Ostomy and/or Continence Assessment and Care []  - 0 Incontinence Assessment and Management []  - 0 Ostomy Care Assessment and Management (repouching, etc.) PROCESS - Coordination of Care []  - 0 Simple Patient / Family Education for ongoing care X- 1 20 Complex (extensive) Patient / Family Education for ongoing care X- 1 10 Staff obtains Chiropractor, Records, T Results / Process Orders est X- 1 10 Staff telephones HHA, Nursing Homes / Clarify orders / etc []  - 0 Routine Transfer to another Facility (non-emergent condition) []  - 0 Routine Hospital Admission (non-emergent condition) X- 1 15 New Admissions / Manufacturing engineer / Ordering NPWT Apligraf, etc. , []  - 0 Emergency Hospital Admission (emergent condition) PROCESS - Special Needs []  - 0 Pediatric / Minor Patient Management []  - 0 Isolation Patient Management []  - 0 Hearing / Language / Visual special needs []  - 0 Assessment of Community assistance (transportation, D/C planning, etc.) []  - 0 Additional assistance / Altered mentation []  - 0 Support Surface(s) Assessment (bed, cushion, seat,  etc.) INTERVENTIONS - Miscellaneous []  - 0 External ear exam []  - 0 Patient Transfer (multiple staff / Nurse, adult / Similar devices) []  - 0 Simple Staple / Suture removal (25 or less) []  - 0 Complex Staple / Suture removal (26 or more) []  - 0 Hypo/Hyperglycemic Management (do not check if billed separately) X- 1 15 Ankle / Brachial Index (ABI) - do not check if billed separately Has  the patient been seen at the hospital within the last three years: Yes Total Score: 115 Level Of Care: New/Established - Level 3 Electronic Signature(s) Signed: 05/31/2023 5:51:07 PM By: Redmond Pulling RN, BSN Entered By: Redmond Pulling on 05/31/2023 14:17:17 [name], [name] ([id number]) [id number] Page 3 of 14 -------------------------------------------------------------------------------- Compression Therapy Details Patient Name: Date of Service: [name] 05/31/2023 12:30 PM Medical Record Number: [id number] Patient Account Number: [id number] Date of Birth/Sex: Treating RN: 21-Sep-1928 (89 y.o. [name] Primary Care Sadee Osland: Dettinger, Ivin Booty Other Clinician: Referring Dain Laseter: Treating Naveed Humphres/Extender: Gretel Acre in Treatment: 0 Compression Therapy Performed for Wound Assessment: Wound #1 Left,Medial Lower Leg Performed By: Clinician Redmond Pulling, RN Compression Type: Three Layer Post Procedure Diagnosis Same as Pre-procedure Electronic Signature(s) Signed: 05/31/2023 5:51:07 PM By: Redmond Pulling RN, BSN Entered By: Redmond Pulling on 05/31/2023 14:16:14 -------------------------------------------------------------------------------- Compression Therapy Details Patient Name: Date of Service: [name], [name]. 05/31/2023 12:30 PM Medical Record Number: [id number] Patient Account Number: [id number] Date of Birth/Sex: Treating RN: Feb 25, 1929 (89 y.o. [name] Primary Care Alondra Vandeven: Dettinger,  Ivin Booty Other Clinician: Referring Yerick Eggebrecht: Treating Mckynna Vanloan/Extender: Gretel Acre in Treatment: 0 Compression Therapy Performed for Wound Assessment: Wound #2 Right,Dorsal Foot Performed By: Clinician Redmond Pulling, RN Compression Type: Three Layer Post Procedure Diagnosis Same as Pre-procedure Electronic Signature(s) Signed: 05/31/2023 5:51:07 PM By: Redmond Pulling RN, BSN Entered By: Redmond Pulling on 05/31/2023 14:16:14 -------------------------------------------------------------------------------- Compression Therapy Details Patient Name: Date of Service: [name], [name]. 05/31/2023 12:30 PM Medical Record Number: [id number] Patient Account Number: [id number] Date of Birth/Sex: Treating RN: 1929-05-07 (89 y.o. [name] Primary Care Clements Toro: Dettinger, Ivin Booty Other Clinician: Referring Castle Lamons: Treating Shirlena Brinegar/Extender: Gretel Acre in Treatment: 0 Compression Therapy Performed for Wound Assessment: Wound #3 Right Lower Leg Performed By: Clinician Redmond Pulling, RN Compression Type: Three Layer Post Procedure Diagnosis Roddey, Kanasia [name] ([id number]) [id number] Page 4 of 14 Same as Pre-procedure Electronic Signature(s) Signed: 05/31/2023 5:51:07 PM By: Redmond Pulling RN, BSN Entered By: Redmond Pulling on 05/31/2023 14:16:14 -------------------------------------------------------------------------------- Compression Therapy Details Patient Name: Date of Service: [name], [name]. 05/31/2023 12:30 PM Medical Record Number: [id number] Patient Account Number: [id number] Date of Birth/Sex: Treating RN: [birth year] (89 y.o. [name] Primary Care Dahlila Pfahler: Dettinger, Ivin Booty Other Clinician: Referring Alayne Estrella: Treating Mairely Foxworth/Extender: Gretel Acre in Treatment: 0 Compression Therapy Performed for Wound Assessment: Wound #4  Right,Proximal,Dorsal Foot Performed By: Clinician Redmond Pulling, RN Compression Type: Three Layer Post Procedure Diagnosis Same as Pre-procedure Electronic Signature(s) Signed: 05/31/2023 5:51:07 PM By: Redmond Pulling RN, BSN Entered By: Redmond Pulling on 05/31/2023 14:16:14 -------------------------------------------------------------------------------- Encounter Discharge Information Details Patient Name: Date of Service: [name], [name]. 05/31/2023 12:30 PM Medical Record Number: [id number] Patient Account Number: [id number] Date of Birth/Sex: Treating RN: [birth year] (89 y.o. [name] Primary Care Rovena Hearld: Dettinger, Ivin Booty Other Clinician: Referring Alene Bergerson: Treating River Ambrosio/Extender: Baltazar Najjar Dettinger, Larence Penning in Treatment: 0 Encounter Discharge Information Items Discharge Condition: Stable Ambulatory Status: Wheelchair Discharge Destination: Home Transportation: Private Auto Accompanied By: son Schedule Follow-up Appointment: Yes Clinical Summary of Care: Patient Declined Electronic Signature(s) Signed: 05/31/2023 5:51:07 PM By: Redmond Pulling RN, BSN Entered By: Redmond Pulling on 05/31/2023 14:19:11 [name], [name] ([id number]) [id number] Page 5 of 14 -------------------------------------------------------------------------------- Lower Extremity Assessment Details Patient Name: Date of Service: [name] 05/31/2023 12:30 PM Medical Record Number: [id number] Patient Account Number: [id number] Date of Birth/Sex:  Treating RN: 03/24/1929 (89 y.o. F) Primary Care Alantis Bethune: Dettinger, Ivin Booty Other Clinician: Referring Denzell Colasanti: Treating Glorian Mcdonell/Extender: Claudean Severance, Larence Penning in Treatment: 0 Edema Assessment Assessed: Kyra Searles: No] Franne Forts: No] [Left: Edema] [Right: :] Calf Left: Right: Point of Measurement: 21 cm From Medial Instep 45 cm 32 cm Ankle Left: Right: Point of  Measurement: 9 cm From Medial Instep 32 cm 20 cm Knee To Floor Left: Right: From Medial Instep 34 cm Vascular Assessment Pulses: Dorsalis Pedis Palpable: [Left:Yes] [Right:Yes] Extremity colors, hair growth, and conditions: Extremity Color: [Left:Normal] [Right:Normal] Hair Growth on Extremity: [Left:No] [Right:No] Temperature of Extremity: [Left:Warm] [Right:Warm] Dependent Rubor: [Left:No] [Right:No] Blanched when Elevated: [Left:No No] [Right:No No] Toe Nail Assessment Left: Right: Thick: Yes Yes Discolored: No No Deformed: No No Improper Length and Hygiene: No No Electronic Signature(s) Signed: 06/07/2023 4:00:57 PM By: Thayer Dallas Entered By: Thayer Dallas on 05/31/2023 10:29:01 -------------------------------------------------------------------------------- Multi Wound Chart Details Patient Name: Date of Service: [name], [name]. 05/31/2023 12:30 PM Medical Record Number: [id number] Patient Account Number: [id number] Date of Birth/Sex: Treating RN: 02/16/1929 (89 y.o. F) Primary Care Lorena Benham: Dettinger, Ivin Booty Other Clinician: Referring Ayeshia Coppin: Treating Kynnedy Carreno/Extender: Claudean Severance, Larence Penning in Treatment: 0 [1:Photos:] [phone].pdf Page 6 of 14] Left, Medial Lower Leg Right, Dorsal Foot Right Lower Leg Wound Location: Gradually Appeared Gradually Appeared Gradually Appeared Wounding Event: T be determined o T be determined o T be determined o Primary Etiology: Glaucoma, Arrhythmia, Congestive Glaucoma, Arrhythmia, Congestive Glaucoma, Arrhythmia, Congestive Comorbid History: Heart Failure, Deep Vein Thrombosis, Heart Failure, Deep Vein Thrombosis, Heart Failure, Deep Vein Thrombosis, Hypertension, Osteoarthritis Hypertension, Osteoarthritis Hypertension, Osteoarthritis 05/17/2023 03/01/2023 05/24/2023 Date Acquired: 0 0 0 Weeks of Treatment: Open Open Open Wound Status: No No No Wound  Recurrence: 0.5x0.3x0.1 1.7x1.5x0.2 1.2x1x0.1 Measurements L x W x D (cm) 0.118 2.003 0.942 A (cm) : rea 0.012 0.401 0.094 Volume (cm) : Partial Thickness Partial Thickness Partial Thickness Classification: Medium Medium Medium Exudate A mount: Serosanguineous Serosanguineous Serosanguineous Exudate Type: red, brown red, brown red, brown Exudate Color: Large (67-100%) Small (1-33%) Large (67-100%) Granulation A mount: Red Red Red Granulation Quality: N/A Large (67-100%) Small (1-33%) Necrotic A mount: Fat Layer (Subcutaneous Tissue): Yes Fat Layer (Subcutaneous Tissue): Yes Fat Layer (Subcutaneous Tissue): Yes Exposed Structures: Fascia: No Fascia: No Fascia: No Tendon: No Tendon: No Tendon: No Muscle: No Muscle: No Muscle: No Joint: No Joint: No Joint: No Bone: No Bone: No Bone: No Large (67-100%) None Small (1-33%) Epithelialization: Excoriation: No Excoriation: No Excoriation: No Periwound Skin Texture: Induration: No Induration: No Induration: No Callus: No Callus: No Callus: No Crepitus: No Crepitus: No Crepitus: No Rash: No Rash: No Rash: No Scarring: No Scarring: No Scarring: No Maceration: No Maceration: No Maceration: No Periwound Skin Moisture: Dry/Scaly: No Dry/Scaly: No Dry/Scaly: No Atrophie Blanche: No Atrophie Blanche: No Atrophie Blanche: No Periwound Skin Color: Cyanosis: No Cyanosis: No Cyanosis: No Ecchymosis: No Ecchymosis: No Ecchymosis: No Erythema: No Erythema: No Erythema: No Hemosiderin Staining: No Hemosiderin Staining: No Hemosiderin Staining: No Mottled: No Mottled: No Mottled: No Pallor: No Pallor: No Pallor: No Rubor: No Rubor: No Rubor: No N/A N/A No Abnormality Temperature: Wound Number: 4 N/A N/A Photos: No Photos N/A N/A Right, Proximal, Dorsal Foot N/A N/A Wound Location: Gradually Appeared N/A N/A Wounding Event: Venous Leg Ulcer N/A N/A Primary Etiology: Glaucoma, Arrhythmia,  Congestive N/A N/A Comorbid History: Heart Failure, Deep Vein Thrombosis, Hypertension, Osteoarthritis 05/31/2023 N/A N/A Date Acquired: 0 N/A N/A Weeks of Treatment: Open N/A  N/A Wound Status: No N/A N/A Wound Recurrence: 0.3x4x0.1 N/A N/A Measurements L x W x D (cm) 0.942 N/A N/A A (cm) : rea 0.094 N/A N/A Volume (cm) : Full Thickness Without Exposed N/A N/A Classification: Support Structures Medium N/A N/A Exudate A mount: Serosanguineous N/A N/A Exudate Type: red, brown N/A N/A Exudate Color: Large (67-100%) N/A N/A Granulation A mount: Red N/A N/A Granulation Quality: Small (1-33%) N/A N/A Necrotic A mount: Fat Layer (Subcutaneous Tissue): Yes N/A N/A Exposed Structures: None N/A N/A Epithelialization: N/A N/A N/A Temperature: Treatment Notes Electronic Signature(s) Signed: 05/31/2023 5:18:32 PM By: Baltazar Najjar MD [name], [name] ([id number]) [id number] Page 7 of 14 Entered By: Baltazar Najjar on 05/31/2023 11:33:18 -------------------------------------------------------------------------------- Multi-Disciplinary Care Plan Details Patient Name: Date of Service: [name] 05/31/2023 12:30 PM Medical Record Number: [id number] Patient Account Number: [id number] Date of Birth/Sex: Treating RN: [birth year] (89 y.o. [name] Primary Care Valmore Arabie: Dettinger, Ivin Booty Other Clinician: Referring Thania Woodlief: Treating Annetta Deiss/Extender: Baltazar Najjar Dettinger, Larence Penning in Treatment: 0 Active Inactive Venous Leg Ulcer Nursing Diagnoses: Actual venous Insuffiency (use after diagnosis is confirmed) Knowledge deficit related to disease process and management Goals: Patient will maintain optimal edema control Date Initiated: 05/31/2023 Target Resolution Date: 07/12/2023 Goal Status: Active Patient/caregiver will verbalize understanding of disease process and disease management Date Initiated:  05/31/2023 Target Resolution Date: 07/12/2023 Goal Status: Active Verify adequate tissue perfusion prior to therapeutic compression application Date Initiated: 05/31/2023 Target Resolution Date: 07/12/2023 Goal Status: Active Interventions: Assess peripheral edema status every visit. Compression as ordered Provide education on venous insufficiency Notes: Wound/Skin Impairment Nursing Diagnoses: Impaired tissue integrity Knowledge deficit related to ulceration/compromised skin integrity Goals: Patient/caregiver will verbalize understanding of skin care regimen Date Initiated: 05/31/2023 Target Resolution Date: 07/12/2023 Goal Status: Active Ulcer/skin breakdown will have a volume reduction of 30% by week 4 Date Initiated: 05/31/2023 Target Resolution Date: 06/28/2023 Goal Status: Active Interventions: Assess patient/caregiver ability to obtain necessary supplies Assess patient/caregiver ability to perform ulcer/skin care regimen upon admission and as needed Assess ulceration(s) every visit Provide education on ulcer and skin care Notes: Electronic Signature(s) Signed: 05/31/2023 5:51:07 PM By: Redmond Pulling RN, BSN Entered By: Redmond Pulling on 05/31/2023 11:09:04 [name], [name] ([id number]) [id number] Page 8 of 14 -------------------------------------------------------------------------------- Patient/Caregiver Education Details Patient Name: Date of Service: [name] 12/19/2024andnbsp12:30 PM Medical Record Number: [id number] Patient Account Number: [id number] Date of Birth/Gender: Treating RN: 05-Dec-1928 (89 y.o. [name] Primary Care Physician: Dettinger, Ivin Booty Other Clinician: Referring Physician: Treating Physician/Extender: Baltazar Najjar Dettinger, Larence Penning in Treatment: 0 Education Assessment Education Provided To: Patient Education Topics Provided Venous: Methods: Explain/Verbal Responses: State  content correctly Wound/Skin Impairment: Methods: Explain/Verbal Responses: State content correctly Electronic Signature(s) Signed: 05/31/2023 5:51:07 PM By: Redmond Pulling RN, BSN Entered By: Redmond Pulling on 05/31/2023 11:09:22 -------------------------------------------------------------------------------- Wound Assessment Details Patient Name: Date of Service: [name], [name]. 05/31/2023 12:30 PM Medical Record Number: [id number] Patient Account Number: [id number] Date of Birth/Sex: Treating RN: March 04, 1929 (89 y.o. F) Primary Care Thayden Lemire: Dettinger, Ivin Booty Other Clinician: Referring Cathlyn Tersigni: Treating Netasha Wehrli/Extender: Claudean Severance, Larence Penning in Treatment: 0 Wound Status Wound Number: 1 Primary T be determined o Etiology: Wound Location: Left, Medial Lower Leg Wound Open Wounding Event: Gradually Appeared Status: Date Acquired: 05/17/2023 Comorbid Glaucoma, Arrhythmia, Congestive Heart Failure, Deep Vein Weeks Of Treatment: 0 History: Thrombosis, Hypertension, Osteoarthritis Clustered Wound: No Photos [name], [name] ([id number]) [id number] Page 9 of 14 Wound Measurements Length: (cm) 0.5 Width: (  cm) 0.3 Depth: (cm) 0.1 Area: (cm) 0.118 Volume: (cm) 0.012 % Reduction in Area: % Reduction in Volume: Epithelialization: Large (67-100%) Tunneling: No Undermining: No Wound Description Classification: Partial Thickness Exudate Amount: Medium Exudate Type: Serosanguineous Exudate Color: red, brown Foul Odor After Cleansing: No Slough/Fibrino No Wound Bed Granulation Amount: Large (67-100%) Exposed Structure Granulation Quality: Red Fascia Exposed: No Fat Layer (Subcutaneous Tissue) Exposed: Yes Tendon Exposed: No Muscle Exposed: No Joint Exposed: No Bone Exposed: No Periwound Skin Texture Texture Color No Abnormalities Noted: No No Abnormalities Noted: No Callus: No Atrophie Blanche: No Crepitus:  No Cyanosis: No Excoriation: No Ecchymosis: No Induration: No Erythema: No Rash: No Hemosiderin Staining: No Scarring: No Mottled: No Pallor: No Moisture Rubor: No No Abnormalities Noted: No Dry / Scaly: No Maceration: No Treatment Notes Wound #1 (Lower Leg) Wound Laterality: Left, Medial Cleanser Soap and Water Discharge Instruction: May shower and wash wound with dial antibacterial soap and water prior to dressing change. Peri-Wound Care Triamcinolone 15 (g) Discharge Instruction: Use triamcinolone 15 (g) as directed Sween Lotion (Moisturizing lotion) Discharge Instruction: Apply moisturizing lotion as directed Topical Primary Dressing Hydrofera Blue Ready Transfer Foam, 4x5 (in/in) Discharge Instruction: Apply to wound bed as instructed Secondary Dressing ABD Pad, 8x10 Discharge Instruction: Apply over primary dressing as directed. Secured With Compression Wrap Urgo K2 Lite, (equivalent to a 3 layer) two layer compression system, regular Discharge Instruction: Apply Urgo K2 Lite as directed (alternative to 3 layer compression). Compression Stockings Add-Ons Electronic Signature(s) Signed: 06/07/2023 4:00:57 PM By: Thayer Dallas Entered By: Thayer Dallas on 05/31/2023 10:37:39 [place], [name] ([id number]) [id number] Page 10 of 14 -------------------------------------------------------------------------------- Wound Assessment Details Patient Name: Date of Service: [name] 05/31/2023 12:30 PM Medical Record Number: [id number] Patient Account Number: [id number] Date of Birth/Sex: Treating RN: [birth year] (89 y.o. F) Primary Care Eevee Borbon: Dettinger, Ivin Booty Other Clinician: Referring Geselle Cardosa: Treating Tonantzin Mimnaugh/Extender: Claudean Severance, Larence Penning in Treatment: 0 Wound Status Wound Number: 2 Primary T be determined o Etiology: Wound Location: Right, Dorsal Foot Wound Open Wounding Event: Gradually  Appeared Status: Date Acquired: 03/01/2023 Comorbid Glaucoma, Arrhythmia, Congestive Heart Failure, Deep Vein Weeks Of Treatment: 0 History: Thrombosis, Hypertension, Osteoarthritis Clustered Wound: No Photos Wound Measurements Length: (cm) 1. Width: (cm) 1. Depth: (cm) 0. Area: (cm) 2 Volume: (cm) 0 7 % Reduction in Area: 5 % Reduction in Volume: 2 Epithelialization: None .003 Tunneling: No .401 Undermining: No Wound Description Classification: Partial Thickness Exudate Amount: Medium Exudate Type: Serosanguineous Exudate Color: red, brown Foul Odor After Cleansing: No Slough/Fibrino Yes Wound Bed Granulation Amount: Small (1-33%) Exposed Structure Granulation Quality: Red Fascia Exposed: No Necrotic Amount: Large (67-100%) Fat Layer (Subcutaneous Tissue) Exposed: Yes Necrotic Quality: Adherent Slough Tendon Exposed: No Muscle Exposed: No Joint Exposed: No Bone Exposed: No Periwound Skin Texture Texture Color No Abnormalities Noted: No No Abnormalities Noted: No Callus: No Atrophie Blanche: No Crepitus: No Cyanosis: No Excoriation: No Ecchymosis: No Induration: No Erythema: No Rash: No Hemosiderin Staining: No Scarring: No Mottled: No Pallor: No Moisture Rubor: No No Abnormalities Noted: No Dry / Scaly: No Maceration: No [name], [name] ([id number]) [id number] Page 11 of 14 Treatment Notes Wound #2 (Foot) Wound Laterality: Dorsal, Right Cleanser Soap and Water Discharge Instruction: May shower and wash wound with dial antibacterial soap and water prior to dressing change. Peri-Wound Care Triamcinolone 15 (g) Discharge Instruction: Use triamcinolone 15 (g) as directed Sween Lotion (Moisturizing lotion) Discharge Instruction: Apply moisturizing lotion as directed Topical Primary Dressing Hydrofera Blue Ready  Transfer Foam, 4x5 (in/in) Discharge Instruction: Apply to wound bed as instructed Secondary Dressing ABD  Pad, 8x10 Discharge Instruction: Apply over primary dressing as directed. Secured With Compression Wrap Urgo K2 Lite, (equivalent to a 3 layer) two layer compression system, regular Discharge Instruction: Apply Urgo K2 Lite as directed (alternative to 3 layer compression). Compression Stockings Add-Ons Electronic Signature(s) Signed: 06/07/2023 4:00:57 PM By: Thayer Dallas Entered By: Thayer Dallas on 05/31/2023 10:38:16 -------------------------------------------------------------------------------- Wound Assessment Details Patient Name: Date of Service: [name] RDWELLLacy Duverney 05/31/2023 12:30 PM Medical Record Number: [id number] Patient Account Number: [id number] Date of Birth/Sex: Treating RN: 1928/09/16 (89 y.o. F) Primary Care Keyia Moretto: Dettinger, Ivin Booty Other Clinician: Referring Luismanuel Corman: Treating Milfred Krammes/Extender: Claudean Severance, Larence Penning in Treatment: 0 Wound Status Wound Number: 3 Primary T be determined o Etiology: Wound Location: Right Lower Leg Wound Open Wounding Event: Gradually Appeared Status: Date Acquired: 05/24/2023 Comorbid Glaucoma, Arrhythmia, Congestive Heart Failure, Deep Vein Weeks Of Treatment: 0 History: Thrombosis, Hypertension, Osteoarthritis Clustered Wound: No Photos [name], [name] ([id number]) [id number] Page 12 of 14 Wound Measurements Length: (cm) 1.2 Width: (cm) 1 Depth: (cm) 0.1 Area: (cm) 0.942 Volume: (cm) 0.094 % Reduction in Area: % Reduction in Volume: Epithelialization: Small (1-33%) Tunneling: No Undermining: No Wound Description Classification: Partial Thickness Exudate Amount: Medium Exudate Type: Serosanguineous Exudate Color: red, brown Foul Odor After Cleansing: No Slough/Fibrino No Wound Bed Granulation Amount: Large (67-100%) Exposed Structure Granulation Quality: Red Fascia Exposed: No Necrotic Amount: Small (1-33%) Fat Layer (Subcutaneous Tissue) Exposed:  Yes Necrotic Quality: Adherent Slough Tendon Exposed: No Muscle Exposed: No Joint Exposed: No Bone Exposed: No Periwound Skin Texture Texture Color No Abnormalities Noted: No No Abnormalities Noted: No Callus: No Atrophie Blanche: No Crepitus: No Cyanosis: No Excoriation: No Ecchymosis: No Induration: No Erythema: No Rash: No Hemosiderin Staining: No Scarring: No Mottled: No Pallor: No Moisture Rubor: No No Abnormalities Noted: No Dry / Scaly: No Temperature / Pain Maceration: No Temperature: No Abnormality Treatment Notes Wound #3 (Lower Leg) Wound Laterality: Right Cleanser Soap and Water Discharge Instruction: May shower and wash wound with dial antibacterial soap and water prior to dressing change. Peri-Wound Care Triamcinolone 15 (g) Discharge Instruction: Use triamcinolone 15 (g) as directed Sween Lotion (Moisturizing lotion) Discharge Instruction: Apply moisturizing lotion as directed Topical Primary Dressing Hydrofera Blue Ready Transfer Foam, 4x5 (in/in) Discharge Instruction: Apply to wound bed as instructed Secondary Dressing ABD Pad, 8x10 Discharge Instruction: Apply over primary dressing as directed. [name], [name] ([id number]) [id number] Page 13 of 14 Secured With Compression Wrap Urgo K2 Lite, (equivalent to a 3 layer) two layer compression system, regular Discharge Instruction: Apply Urgo K2 Lite as directed (alternative to 3 layer compression). Compression Stockings Add-Ons Electronic Signature(s) Signed: 06/07/2023 4:00:57 PM By: Thayer Dallas Entered By: Thayer Dallas on 05/31/2023 10:38:53 -------------------------------------------------------------------------------- Wound Assessment Details Patient Name: Date of Service: [name] RDWELLLacy Duverney 05/31/2023 12:30 PM Medical Record Number: [id number] Patient Account Number: [id number] Date of Birth/Sex: Treating RN: 09/28/1928 (89 y.o. [name] Primary Care Joyanna Kleman: Dettinger, Ivin Booty Other Clinician: Referring Tiffanie Blassingame: Treating Essence Merle/Extender: Baltazar Najjar Dettinger, Larence Penning in Treatment: 0 Wound Status Wound Number: 4 Primary Venous Leg Ulcer Etiology: Wound Location: Right, Proximal, Dorsal Foot Wound Open Wounding Event: Gradually Appeared Status: Date Acquired: 05/31/2023 Comorbid Glaucoma, Arrhythmia, Congestive Heart Failure, Deep Vein Weeks Of Treatment: 0 History: Thrombosis, Hypertension, Osteoarthritis Clustered Wound: No Wound Measurements Length: (cm) 0.3 Width: (cm) 4 Depth: (cm) 0.1 Area: (cm) 0.942 Volume: (cm) 0.094 %  Reduction in Area: % Reduction in Volume: Epithelialization: None Tunneling: No Undermining: No Wound Description Classification: Full Thickness Without Exposed Suppor Exudate Amount: Medium Exudate Type: Serosanguineous Exudate Color: red, brown t Structures Foul Odor After Cleansing: No Slough/Fibrino Yes Wound Bed Granulation Amount: Large (67-100%) Exposed Structure Granulation Quality: Red Fat Layer (Subcutaneous Tissue) Exposed: Yes Necrotic Amount: Small (1-33%) Necrotic Quality: Adherent Slough Periwound Skin Texture Texture Color No Abnormalities Noted: No No Abnormalities Noted: No Moisture No Abnormalities Noted: No Treatment Notes Wound #4 (Foot) Wound Laterality: Dorsal, Right, Proximal Cleanser Soap and Water Discharge Instruction: May shower and wash wound with dial antibacterial soap and water prior to dressing change. Peri-Wound Care [name], LOJEK [name] ([id number]) [id number] Page 14 of 14 Triamcinolone 15 (g) Discharge Instruction: Use triamcinolone 15 (g) as directed Sween Lotion (Moisturizing lotion) Discharge Instruction: Apply moisturizing lotion as directed Topical Primary Dressing Hydrofera Blue Ready Transfer Foam, 4x5 (in/in) Discharge Instruction: Apply to wound bed as instructed Secondary  Dressing ABD Pad, 8x10 Discharge Instruction: Apply over primary dressing as directed. Secured With Compression Wrap Urgo K2 Lite, (equivalent to a 3 layer) two layer compression system, regular Discharge Instruction: Apply Urgo K2 Lite as directed (alternative to 3 layer compression). Compression Stockings Add-Ons Electronic Signature(s) Signed: 05/31/2023 5:51:07 PM By: Redmond Pulling RN, BSN Entered By: Redmond Pulling on 05/31/2023 11:26:53 -------------------------------------------------------------------------------- Vitals Details Patient Name: Date of Service: [name], [name]. 05/31/2023 12:30 PM Medical Record Number: [id number] Patient Account Number: [id number] Date of Birth/Sex: Treating RN: February 26, 1929 (89 y.o. F) Primary Care Zanovia Rotz: Dettinger, Ivin Booty Other Clinician: Referring Aoki Wedemeyer: Treating Jordan Caraveo/Extender: Claudean Severance, Larence Penning in Treatment: 0 Vital Signs Time Taken: 12:50 Reference Range: 80 - 120 mg / dl Height (in): 63 Source: Stated Weight (lbs): 135 Source: Stated Body Mass Index (BMI): 23.9 Electronic Signature(s) Signed: 06/07/2023 4:00:57 PM By: Thayer Dallas Entered By: Thayer Dallas on 05/31/2023 09:50:47

## 2023-06-07 NOTE — Progress Notes (Signed)
SHIVONNE, TRUDEL B ([id number]) [id number] Page 1 of 1 Visit Report for 06/07/2023 SuperBill Details Patient Name: Date of Service: [name] 06/07/2023 Medical Record Number: [id number] Patient Account Number: [id number] Date of Birth/Sex: Treating RN: 19-Dec-1928 (89 y.o. F) Primary Care Provider: Dettinger, Ivin Booty Other Clinician: Thayer Dallas Referring Provider: Treating Provider/Extender: Duanne Guess Dettinger, Larence Penning in Treatment: 1 Diagnosis Coding ICD-10 Codes Code Description [phone] Chronic venous hypertension (idiopathic) with inflammation of bilateral lower extremity L97.828 Non-pressure chronic ulcer of other part of left lower leg with other specified severity L97.818 Non-pressure chronic ulcer of other part of right lower leg with other specified severity L97.518 Non-pressure chronic ulcer of other part of right foot with other specified severity Facility Procedures CPT4 Description Modifier Quantity Code [id number] BILATERAL: Application of multi-layer venous compression system; leg (below knee), including ankle and 1 foot. Electronic Signature(s) Signed: 06/07/2023 10:51:24 AM By: Duanne Guess MD FACS Signed: 06/07/2023 4:00:31 PM By: Thayer Dallas Entered By: Thayer Dallas on 06/07/2023 06:53:17

## 2023-06-07 NOTE — Progress Notes (Signed)
YARIBEL, PIETTE B ([id number]) [id number] Page 1 of 8 Visit Report for 06/07/2023 Arrival Information Details Patient Name: Date of Service: [name] 06/07/2023 8:45 A M Medical Record Number: [id number] Patient Account Number: [id number] Date of Birth/Sex: Treating RN: 09/18/1928 (89 y.o. F) Primary Care Miro Balderson: Dettinger, Ivin Booty Other Clinician: Referring Rosalena Mccorry: Treating Zissel Biederman/Extender: Duanne Guess Dettinger, Larence Penning in Treatment: 1 Visit Information History Since Last Visit Added or deleted any medications: No Patient Arrived: Wheel Chair Any new allergies or adverse reactions: No Arrival Time: 09:49 Had a fall or experienced change in No Accompanied By: son and daughter in law activities of daily living that may affect Transfer Assistance: None risk of falls: Patient Identification Verified: Yes Signs or symptoms of abuse/neglect since last visito No Secondary Verification Process Completed: Yes Hospitalized since last visit: No Patient Requires Transmission-Based Precautions: No Implantable device outside of the clinic excluding No Patient Has Alerts: Yes cellular tissue based products placed in the center Patient Alerts: Patient on Blood Thinner since last visit: Has Dressing in Place as Prescribed: Yes Has Compression in Place as Prescribed: Yes Pain Present Now: No Electronic Signature(s) Signed: 06/07/2023 4:00:31 PM By: Thayer Dallas Entered By: Thayer Dallas on 06/07/2023 06:51:23 -------------------------------------------------------------------------------- Compression Therapy Details Patient Name: Date of Service: [name], [name] 06/07/2023 8:45 A M Medical Record Number: [id number] Patient Account Number: [id number] Date of Birth/Sex: Treating RN: 01/02/1929 (89 y.o. F) Primary Care Stevan Eberwein: Dettinger, Ivin Booty Other Clinician: Referring Jaishawn Witzke: Treating Jullian Previti/Extender: Duanne Guess Dettinger, Larence Penning in Treatment: 1 Compression Therapy Performed for Wound Assessment: Wound #1 Left,Medial Lower Leg Performed By: Clinician Thayer Dallas, Compression Type: Double Layer Electronic Signature(s) Signed: 06/07/2023 4:00:31 PM By: Thayer Dallas Entered By: Thayer Dallas on 06/07/2023 06:52:17 -------------------------------------------------------------------------------- Compression Therapy Details Patient Name: Date of Service: [name], [name] 06/07/2023 8:45 A M Medical Record Number: [id number] Patient Account Number: [id number] HAILEIGH, WESTROPE ([id number]) [id number] Page 2 of 8 Date of Birth/Sex: Treating RN: 16-Jul-1928 (89 y.o. F) Primary Care Loreto Loescher: Dettinger, Ivin Booty Other Clinician: Referring Kalep Full: Treating Lizandra Zakrzewski/Extender: Duanne Guess Dettinger, Larence Penning in Treatment: 1 Compression Therapy Performed for Wound Assessment: Wound #2 Right,Dorsal Foot Performed By: Clinician Thayer Dallas, Compression Type: Double Layer Electronic Signature(s) Signed: 06/07/2023 4:00:31 PM By: Thayer Dallas Entered By: Thayer Dallas on 06/07/2023 06:52:17 -------------------------------------------------------------------------------- Compression Therapy Details Patient Name: Date of Service: [name], Lacy Duverney 06/07/2023 8:45 A M Medical Record Number: [id number] Patient Account Number: [id number] Date of Birth/Sex: Treating RN: 1928-07-28 (89 y.o. F) Primary Care Hubert Derstine: Dettinger, Ivin Booty Other Clinician: Referring Mariah Gerstenberger: Treating Madhavi Hamblen/Extender: Duanne Guess Dettinger, Larence Penning in Treatment: 1 Compression Therapy Performed for Wound Assessment: Wound #3 Right Lower Leg Performed By: Clinician Thayer Dallas, Compression Type: Double Layer Electronic Signature(s) Signed: 06/07/2023 4:00:31 PM By: Thayer Dallas Entered By: Thayer Dallas on 06/07/2023  06:52:17 -------------------------------------------------------------------------------- Compression Therapy Details Patient Name: Date of Service: [name], Lacy Duverney 06/07/2023 8:45 A M Medical Record Number: [id number] Patient Account Number: [id number] Date of Birth/Sex: Treating RN: [birth year] (89 y.o. F) Primary Care Rexanna Louthan: Dettinger, Ivin Booty Other Clinician: Referring Reggie Welge: Treating Kassie Keng/Extender: Duanne Guess Dettinger, Larence Penning in Treatment: 1 Compression Therapy Performed for Wound Assessment: Wound #4 Right,Proximal,Dorsal Foot Performed By: Clinician Thayer Dallas, Compression Type: Double Layer Electronic Signature(s) Signed: 06/07/2023 4:00:31 PM By: Thayer Dallas Entered By: Thayer Dallas on 06/07/2023 06:52:18 -------------------------------------------------------------------------------- Encounter Discharge Information Details Patient Name: Date of Service: [name], [name] 06/07/2023 8:45 A M Medical  Record Number: [id number] Patient Account Number: [id number] Date of Birth/Sex: Treating RN: [birth year] (89 y.o. Jykeria Granito, Troup B ([id number]) [id number] Page 3 of 8 Primary Care Marybel Alcott: Dettinger, Ivin Booty Other Clinician: Thayer Dallas Referring Page Pucciarelli: Treating Ashtan Girtman/Extender: Duanne Guess Dettinger, Larence Penning in Treatment: 1 Encounter Discharge Information Items Discharge Condition: Stable Ambulatory Status: Wheelchair Discharge Destination: Home Transportation: Private Auto Accompanied By: self Schedule Follow-up Appointment: Yes Clinical Summary of Care: Electronic Signature(s) Signed: 06/07/2023 4:00:31 PM By: Thayer Dallas Entered By: Thayer Dallas on 06/07/2023 06:52:58 -------------------------------------------------------------------------------- Patient/Caregiver Education Details Patient Name: Date of Service: [name], Lacy Duverney 12/26/2024andnbsp8:45 A  M Medical Record Number: [id number] Patient Account Number: [id number] Date of Birth/Gender: Treating RN: 1929-05-09 (89 y.o. F) Primary Care Physician: Dettinger, Ivin Booty Other Clinician: Thayer Dallas Referring Physician: Treating Physician/Extender: Duanne Guess Dettinger, Larence Penning in Treatment: 1 Education Assessment Education Provided To: Patient Education Topics Provided Electronic Signature(s) Signed: 06/07/2023 4:00:31 PM By: Thayer Dallas Entered By: Thayer Dallas on 06/07/2023 06:52:42 -------------------------------------------------------------------------------- Wound Assessment Details Patient Name: Date of Service: [name], Lacy Duverney 06/07/2023 8:45 A M Medical Record Number: [id number] Patient Account Number: [id number] Date of Birth/Sex: Treating RN: 02/20/1929 (89 y.o. F) Primary Care Larrie Lucia: Dettinger, Ivin Booty Other Clinician: Referring Clairessa Boulet: Treating Aron Inge/Extender: Duanne Guess Dettinger, Larence Penning in Treatment: 1 Wound Status Wound Number: 1 Primary Etiology: T be determined o Wound Location: Left, Medial Lower Leg Wound Status: Open Wounding Event: Gradually Appeared Date Acquired: 05/17/2023 Weeks Of Treatment: 1 Clustered Wound: No Wound Measurements Dorsainvil, Albertia B ([id number]) Length: (cm) 0.5 Width: (cm) 0.3 Depth: (cm) 0.1 Area: (cm) 0.118 Volume: (cm) [phone].pdf Page 4 of 8 % Reduction in Area: 0% % Reduction in Volume: 0% Wound Description Classification: Partial Thickness Exudate Amount: Medium Exudate Type: Serosanguineous Exudate Color: red, brown Periwound Skin Texture Texture Color No Abnormalities Noted: No No Abnormalities Noted: No Moisture No Abnormalities Noted: No Treatment Notes Wound #1 (Lower Leg) Wound Laterality: Left, Medial Cleanser Soap and Water Discharge Instruction: May shower and wash wound with dial antibacterial soap and water prior to  dressing change. Peri-Wound Care Triamcinolone 15 (g) Discharge Instruction: Use triamcinolone 15 (g) as directed Sween Lotion (Moisturizing lotion) Discharge Instruction: Apply moisturizing lotion as directed Topical Primary Dressing Hydrofera Blue Ready Transfer Foam, 4x5 (in/in) Discharge Instruction: Apply to wound bed as instructed Secondary Dressing ABD Pad, 8x10 Discharge Instruction: Apply over primary dressing as directed. Secured With Compression Wrap Urgo K2 Lite, (equivalent to a 3 layer) two layer compression system, regular Discharge Instruction: Apply Urgo K2 Lite as directed (alternative to 3 layer compression). Compression Stockings Add-Ons Electronic Signature(s) Signed: 06/07/2023 4:00:31 PM By: Thayer Dallas Entered By: Thayer Dallas on 06/07/2023 06:51:54 -------------------------------------------------------------------------------- Wound Assessment Details Patient Name: Date of Service: [name], Lacy Duverney 06/07/2023 8:45 A M Medical Record Number: [id number] Patient Account Number: [id number] Date of Birth/Sex: Treating RN: [birth year] (89 y.o. F) Primary Care Jaksen Fiorella: Dettinger, Ivin Booty Other Clinician: Referring Reilley Latorre: Treating Frankey Botting/Extender: Duanne Guess Dettinger, Larence Penning in Treatment: 1 Wound Status Schimming, [place] B ([id number]) [id number] Page 5 of 8 Wound Number: 2 Primary Etiology: T be determined o Wound Location: Right, Dorsal Foot Wound Status: Open Wounding Event: Gradually Appeared Date Acquired: 03/01/2023 Weeks Of Treatment: 1 Clustered Wound: No Wound Measurements Length: (cm) 1.7 Width: (cm) 1.5 Depth: (cm) 0.2 Area: (cm) 2.003 Volume: (cm) 0.401 % Reduction in Area: 0% % Reduction in Volume: 0% Wound Description Classification: Partial Thickness Exudate Amount: Medium Exudate Type: Serosanguineous Exudate Color: red,  brown Periwound Skin Texture Texture Color No  Abnormalities Noted: No No Abnormalities Noted: No Moisture No Abnormalities Noted: No Treatment Notes Wound #2 (Foot) Wound Laterality: Dorsal, Right Cleanser Soap and Water Discharge Instruction: May shower and wash wound with dial antibacterial soap and water prior to dressing change. Peri-Wound Care Triamcinolone 15 (g) Discharge Instruction: Use triamcinolone 15 (g) as directed Sween Lotion (Moisturizing lotion) Discharge Instruction: Apply moisturizing lotion as directed Topical Primary Dressing Hydrofera Blue Ready Transfer Foam, 4x5 (in/in) Discharge Instruction: Apply to wound bed as instructed Secondary Dressing ABD Pad, 8x10 Discharge Instruction: Apply over primary dressing as directed. Secured With Compression Wrap Urgo K2 Lite, (equivalent to a 3 layer) two layer compression system, regular Discharge Instruction: Apply Urgo K2 Lite as directed (alternative to 3 layer compression). Compression Stockings Add-Ons Electronic Signature(s) Signed: 06/07/2023 4:00:31 PM By: Thayer Dallas Entered By: Thayer Dallas on 06/07/2023 06:51:54 Wound Assessment Details -------------------------------------------------------------------------------- [name] ([id number]) [id number] Page 6 of 8 Patient Name: Date of Service: [name] 06/07/2023 8:45 A M Medical Record Number: [id number] Patient Account Number: [id number] Date of Birth/Sex: Treating RN: April 24, 1929 (89 y.o. F) Primary Care Cicilia Clinger: Dettinger, Ivin Booty Other Clinician: Referring Noam Franzen: Treating Ariane Ditullio/Extender: Duanne Guess Dettinger, Larence Penning in Treatment: 1 Wound Status Wound Number: 3 Primary Etiology: T be determined o Wound Location: Right Lower Leg Wound Status: Open Wounding Event: Gradually Appeared Date Acquired: 05/24/2023 Weeks Of Treatment: 1 Clustered Wound: No Wound Measurements Length: (cm) 1.2 % Reduction in Area:  0% Width: (cm) 1 % Reduction in Volume: 0% Depth: (cm) 0.1 Area: (cm) 0.942 Volume: (cm) 0.094 Wound Description Classification: Partial Thickness Exudate Amount: Medium Exudate Type: Serosanguineous Exudate Color: red, brown Periwound Skin Texture Texture Color No Abnormalities Noted: No No Abnormalities Noted: No Moisture No Abnormalities Noted: No Treatment Notes Wound #3 (Lower Leg) Wound Laterality: Right Cleanser Soap and Water Discharge Instruction: May shower and wash wound with dial antibacterial soap and water prior to dressing change. Peri-Wound Care Triamcinolone 15 (g) Discharge Instruction: Use triamcinolone 15 (g) as directed Sween Lotion (Moisturizing lotion) Discharge Instruction: Apply moisturizing lotion as directed Topical Primary Dressing Hydrofera Blue Ready Transfer Foam, 4x5 (in/in) Discharge Instruction: Apply to wound bed as instructed Secondary Dressing ABD Pad, 8x10 Discharge Instruction: Apply over primary dressing as directed. Secured With Compression Wrap Urgo K2 Lite, (equivalent to a 3 layer) two layer compression system, regular Discharge Instruction: Apply Urgo K2 Lite as directed (alternative to 3 layer compression). Compression Stockings Add-Ons Electronic Signature(s) Signed: 06/07/2023 4:00:31 PM By: Thayer Dallas Entered By: Thayer Dallas on 06/07/2023 06:51:54 Vanwart, [place] B ([id number]) [id number] Page 7 of 8 -------------------------------------------------------------------------------- Wound Assessment Details Patient Name: Date of Service: [name] 06/07/2023 8:45 A M Medical Record Number: [id number] Patient Account Number: [id number] Date of Birth/Sex: Treating RN: [birth year] (89 y.o. F) Primary Care Shaymus Eveleth: Dettinger, Ivin Booty Other Clinician: Referring Zeenat Jeanbaptiste: Treating Chibueze Beasley/Extender: Duanne Guess Dettinger, Larence Penning in Treatment: 1 Wound Status Wound  Number: 4 Primary Etiology: Venous Leg Ulcer Wound Location: Right, Proximal, Dorsal Foot Wound Status: Open Wounding Event: Gradually Appeared Date Acquired: 05/31/2023 Weeks Of Treatment: 1 Clustered Wound: No Wound Measurements Length: (cm) 0.3 Width: (cm) 4 Depth: (cm) 0.1 Area: (cm) 0.942 Volume: (cm) 0.094 % Reduction in Area: 0% % Reduction in Volume: 0% Wound Description Classification: Full Thickness Without Exposed Suppor Exudate Amount: Medium Exudate Type: Serosanguineous Exudate Color: red, brown t Structures Periwound Skin Texture Texture Color No Abnormalities Noted: No No Abnormalities  Noted: No Moisture No Abnormalities Noted: No Treatment Notes Wound #4 (Foot) Wound Laterality: Dorsal, Right, Proximal Cleanser Soap and Water Discharge Instruction: May shower and wash wound with dial antibacterial soap and water prior to dressing change. Peri-Wound Care Triamcinolone 15 (g) Discharge Instruction: Use triamcinolone 15 (g) as directed Sween Lotion (Moisturizing lotion) Discharge Instruction: Apply moisturizing lotion as directed Topical Primary Dressing Hydrofera Blue Ready Transfer Foam, 4x5 (in/in) Discharge Instruction: Apply to wound bed as instructed Secondary Dressing ABD Pad, 8x10 Discharge Instruction: Apply over primary dressing as directed. Secured With Compression Wrap Urgo K2 Lite, (equivalent to a 3 layer) two layer compression system, regular Discharge Instruction: Apply Urgo K2 Lite as directed (alternative to 3 layer compression). Compression Stockings STANISHA, SCHACHER B ([id number]) [id number] Page 8 of 8 Add-Ons Electronic Signature(s) Signed: 06/07/2023 4:00:31 PM By: Thayer Dallas Entered By: Thayer Dallas on 06/07/2023 06:51:54 -------------------------------------------------------------------------------- Vitals Details Patient Name: Date of Service: [name], [name] 06/07/2023 8:45 A  M Medical Record Number: [id number] Patient Account Number: [id number] Date of Birth/Sex: Treating RN: 1928-10-22 (89 y.o. F) Primary Care Larra Crunkleton: Dettinger, Ivin Booty Other Clinician: Referring Jashayla Glatfelter: Treating Aaryanna Hyden/Extender: Duanne Guess Dettinger, Larence Penning in Treatment: 1 Vital Signs Time Taken: 09:51 Reference Range: 80 - 120 mg / dl Height (in): 63 Weight (lbs): 135 Body Mass Index (BMI): 23.9 Electronic Signature(s) Signed: 06/07/2023 4:00:31 PM By: Thayer Dallas Entered By: Thayer Dallas on 06/07/2023 06:51:30

## 2023-06-07 NOTE — Progress Notes (Signed)
[name], BEOUGHER [name] ([id number]) [id number] Page 1 of 10 Visit Report for 05/31/2023 Chief Complaint Document Details Patient Name: Date of Service: [name] 05/31/2023 12:30 PM Medical Record Number: [id number] Patient Account Number: [id number] Date of Birth/Sex: Treating RN: 10-Oct-1928 (89 y.o. F) Primary Care Provider: Dettinger, Ivin Booty Other Clinician: Referring Provider: Treating Provider/Extender: Claudean Severance, Larence Penning in Treatment: 0 Information Obtained from: Patient Chief Complaint 05/31/2023; patient is here for review of wounds on her bilateral lower extremities Electronic Signature(s) Signed: 05/31/2023 5:18:32 PM By: Baltazar Najjar MD Entered By: Baltazar Najjar on 05/31/2023 11:34:05 -------------------------------------------------------------------------------- HPI Details Patient Name: Date of Service: [name], [name]. 05/31/2023 12:30 PM Medical Record Number: [id number] Patient Account Number: [id number] Date of Birth/Sex: Treating RN: 08/27/1928 (89 y.o. F) Primary Care Provider: Dettinger, Ivin Booty Other Clinician: Referring Provider: Treating Provider/Extender: Claudean Severance, Larence Penning in Treatment: 0 History of Present Illness HPI Description: ADMISSION 05/31/2023 This is a 89 year old woman who lives in [place]. She is here with her son and daughter-in-law. I can see that she was in hospital in October with cellulitis of the right foot. An x-ray was negative although it did show that she had had subacute fractures of the neck of the fourth and fifth metatarsals. She was treated with IV antibiotics but discharged on amoxicillin. She was also seen I think in urgent care in September with chronic ulcers after a fall. She was felt to have cellulitis of her right lower extremity at that point. Currently she is under hospice ofo [facility] although I may not  have the correct hospice designation. She has been having some form of compression on these areas are not really clear what they are putting on the wounds. She comes into our clinic with 4 areas 1 on the left medial leg 1 on the right lateral leg and a more substantive area on the right dorsal foot. They tell me that she has been followed for a long period at [facility]. She has had venous ablations done in the past. I do not see that she has ever had formal arterial studies. Past medical history includes heart failure with preserved ejection fraction, hypertension, paroxysmal atrial fibrillation on Eliquis, iron deficiency anemia, T12 compression fractures, hyperlipidemia, lower extremity DVT Ossey caused osteoporosis. As mentioned she is on hospice oro Congestive heart failure s, We attempted to do ABIs in the clinic areas on the right was noncompressible she could not tolerate the left. Electronic Signature(s) Signed: 05/31/2023 5:18:32 PM By: Baltazar Najjar MD Entered By: Baltazar Najjar on 05/31/2023 11:37:30 Cliff, [name] ([id number]) [id number] Page 2 of 10 -------------------------------------------------------------------------------- Physical Exam Details Patient Name: Date of Service: [name] 05/31/2023 12:30 PM Medical Record Number: [id number] Patient Account Number: [id number] Date of Birth/Sex: Treating RN: 11/11/1928 (89 y.o. F) Primary Care Provider: Dettinger, Ivin Booty Other Clinician: Referring Provider: Treating Provider/Extender: Claudean Severance, Larence Penning in Treatment: 0 Respiratory work of breathing is normal. Decreased air entry bilaterally but no crackles or wheezes. Cardiovascular 3 out of 6 systolic ejection murmur at the aortic area 3 out of 6 pansystolic murmur at the lower left sternal border jugular venous pressure is elevated no gallops. Scant sacral edema. Pedal pulses are palpable but  reduced. Her toes are cold. Edema present in both extremities.This is nonpitting. Distally in the lower legs she has leg both lipodermatosclerosis. Atrophie blanche in the left medial lower leg.. Notes Wound exam; the patient has 4 wounds in  the major 1 paradoxically is on the right dorsal foot she has areas on the left medial ankle right lateral ankle and small areas on the right medial ankle. She has severe venous hypertension with dilated veins in her feet and ankle area. Erythema around the wounds I think is stasis dermatitis Electronic Signature(s) Signed: 05/31/2023 5:18:32 PM By: Baltazar Najjar MD Entered By: Baltazar Najjar on 05/31/2023 11:41:47 -------------------------------------------------------------------------------- Physician Orders Details Patient Name: Date of Service: [name], [name]. 05/31/2023 12:30 PM Medical Record Number: [id number] Patient Account Number: [id number] Date of Birth/Sex: Treating RN: [birth year] (89 y.o. [name] Primary Care Provider: Dettinger, Ivin Booty Other Clinician: Referring Provider: Treating Provider/Extender: Baltazar Najjar Dettinger, Larence Penning in Treatment: 0 Verbal / Phone Orders: No Diagnosis Coding ICD-10 Coding Code Description [phone] Chronic venous hypertension (idiopathic) with inflammation of bilateral lower extremity L97.828 Non-pressure chronic ulcer of other part of left lower leg with other specified severity L97.818 Non-pressure chronic ulcer of other part of right lower leg with other specified severity L97.518 Non-pressure chronic ulcer of other part of right foot with other specified severity Follow-up Appointments Return appointment in 3 weeks. - Dr Mikey Bussing- ***front desk to schedule**** Bathing/ Shower/ Hygiene May shower with protection but do not get wound dressing(s) wet. Protect dressing(s) with water repellant cover (for example, large plastic bag) or a cast cover and may then take  shower. Edema Control - Orders / Instructions Bilateral Lower Extremities Elevate legs to the level of the heart or above for 30 minutes daily and/or when sitting for 3-4 times a day throughout the day. void standing for long periods of time. - Walking is ok, not standing still A If compression wraps slide down please call wound center and speak with a nurse. Home Health New wound care orders this week; continue Home Health for wound care. May utilize formulary equivalent dressing for wound treatment orders unless otherwise specified. - Moisturizer and TCA to lower legs under the compression wrap. Hydrofera blue to wound bed, ABD pad for [name], [name] ([id number]) [id number] Page 3 of 10 draining, under 3-layer compression (or equivalent). Change 3x week when not being see in wound clinic, Change 2x per week when not being seen at wound clinic Other Home Health Orders/Instructions: - [facility] fax: [phone] Wound Treatment Wound #1 - Lower Leg Wound Laterality: Left, Medial Cleanser: Soap and Water 3 x Per Week/30 Days Discharge Instructions: May shower and wash wound with dial antibacterial soap and water prior to dressing change. Peri-Wound Care: Triamcinolone 15 (g) 3 x Per Week/30 Days Discharge Instructions: Use triamcinolone 15 (g) as directed Peri-Wound Care: Sween Lotion (Moisturizing lotion) 3 x Per Week/30 Days Discharge Instructions: Apply moisturizing lotion as directed Prim Dressing: Hydrofera Blue Ready Transfer Foam, 4x5 (in/in) 3 x Per Week/30 Days ary Discharge Instructions: Apply to wound bed as instructed Secondary Dressing: ABD Pad, 8x10 3 x Per Week/30 Days Discharge Instructions: Apply over primary dressing as directed. Compression Wrap: Urgo K2 Lite, (equivalent to a 3 layer) two layer compression system, regular 3 x Per Week/30 Days Discharge Instructions: Apply Urgo K2 Lite as directed (alternative to 3 layer  compression). Wound #2 - Foot Wound Laterality: Dorsal, Right Cleanser: Soap and Water 1 x Per Week Discharge Instructions: May shower and wash wound with dial antibacterial soap and water prior to dressing change. Peri-Wound Care: Triamcinolone 15 (g) 1 x Per Week Discharge Instructions: Use triamcinolone 15 (g) as directed Peri-Wound Care: Sween Lotion (Moisturizing lotion) 1 x  Per Week Discharge Instructions: Apply moisturizing lotion as directed Prim Dressing: Hydrofera Blue Ready Transfer Foam, 4x5 (in/in) 1 x Per Week ary Discharge Instructions: Apply to wound bed as instructed Secondary Dressing: ABD Pad, 8x10 1 x Per Week Discharge Instructions: Apply over primary dressing as directed. Compression Wrap: Urgo K2 Lite, (equivalent to a 3 layer) two layer compression system, regular 1 x Per Week Discharge Instructions: Apply Urgo K2 Lite as directed (alternative to 3 layer compression). Wound #3 - Lower Leg Wound Laterality: Right Cleanser: Soap and Water 1 x Per Week Discharge Instructions: May shower and wash wound with dial antibacterial soap and water prior to dressing change. Peri-Wound Care: Triamcinolone 15 (g) 1 x Per Week Discharge Instructions: Use triamcinolone 15 (g) as directed Peri-Wound Care: Sween Lotion (Moisturizing lotion) 1 x Per Week Discharge Instructions: Apply moisturizing lotion as directed Prim Dressing: Hydrofera Blue Ready Transfer Foam, 4x5 (in/in) 1 x Per Week ary Discharge Instructions: Apply to wound bed as instructed Secondary Dressing: ABD Pad, 8x10 1 x Per Week Discharge Instructions: Apply over primary dressing as directed. Compression Wrap: Urgo K2 Lite, (equivalent to a 3 layer) two layer compression system, regular 1 x Per Week Discharge Instructions: Apply Urgo K2 Lite as directed (alternative to 3 layer compression). Wound #4 - Foot Wound Laterality: Dorsal, Right, Proximal Cleanser: Soap and Water 1 x Per Week Discharge Instructions: May  shower and wash wound with dial antibacterial soap and water prior to dressing change. Peri-Wound Care: Triamcinolone 15 (g) 1 x Per Week Discharge Instructions: Use triamcinolone 15 (g) as directed Peri-Wound Care: Sween Lotion (Moisturizing lotion) 1 x Per Week Discharge Instructions: Apply moisturizing lotion as directed Prim Dressing: Hydrofera Blue Ready Transfer Foam, 4x5 (in/in) 1 x Per Week ary Discharge Instructions: Apply to wound bed as instructed Secondary Dressing: ABD Pad, 8x10 1 x Per Week [name], [name] ([id number]) [id number] Page 4 of 10 Discharge Instructions: Apply over primary dressing as directed. Compression Wrap: Urgo K2 Lite, (equivalent to a 3 layer) two layer compression system, regular 1 x Per Week Discharge Instructions: Apply Urgo K2 Lite as directed (alternative to 3 layer compression). Electronic Signature(s) Signed: 05/31/2023 5:51:07 PM By: Redmond Pulling RN, BSN Signed: 06/05/2023 11:51:18 AM By: Baltazar Najjar MD Previous Signature: 05/31/2023 5:18:32 PM Version By: Baltazar Najjar MD Entered By: Redmond Pulling on 05/31/2023 14:39:57 -------------------------------------------------------------------------------- Problem List Details Patient Name: Date of Service: [name], [name] 05/31/2023 12:30 PM Medical Record Number: [id number] Patient Account Number: [id number] Date of Birth/Sex: Treating RN: 1929-02-27 (89 y.o. F) Primary Care Provider: Dettinger, Ivin Booty Other Clinician: Referring Provider: Treating Provider/Extender: Claudean Severance, Larence Penning in Treatment: 0 Active Problems ICD-10 Encounter Code Description Active Date MDM Diagnosis I87.323 Chronic venous hypertension (idiopathic) with inflammation of bilateral lower 05/31/2023 No Yes extremity L97.828 Non-pressure chronic ulcer of other part of left lower leg with other specified 05/31/2023 No Yes severity L97.818 Non-pressure  chronic ulcer of other part of right lower leg with other specified 05/31/2023 No Yes severity L97.518 Non-pressure chronic ulcer of other part of right foot with other specified 05/31/2023 No Yes severity Inactive Problems Resolved Problems Electronic Signature(s) Signed: 05/31/2023 5:18:32 PM By: Baltazar Najjar MD Entered By: Baltazar Najjar on 05/31/2023 11:33:09 -------------------------------------------------------------------------------- Progress Note Details Patient Name: Date of Service: [name], [name]. 05/31/2023 12:30 PM [name], [name] ([id number]) [id number] Page 5 of 10 Medical Record Number: [id number] Patient Account Number: [id number] Date of Birth/Sex: Treating RN: 1928-09-14 (89 y.o. F) Primary Care  Provider: Dettinger, Ivin Booty Other Clinician: Referring Provider: Treating Provider/Extender: Claudean Severance, Larence Penning in Treatment: 0 Subjective Chief Complaint Information obtained from Patient 05/31/2023; patient is here for review of wounds on her bilateral lower extremities History of Present Illness (HPI) ADMISSION 05/31/2023 This is a 89 year old woman who lives in [place]. She is here with her son and daughter-in-law. I can see that she was in hospital in October with cellulitis of the right foot. An x-ray was negative although it did show that she had had subacute fractures of the neck of the fourth and fifth metatarsals. She was treated with IV antibiotics but discharged on amoxicillin. She was also seen I think in urgent care in September with chronic ulcers after a fall. She was felt to have cellulitis of her right lower extremity at that point. Currently she is under hospice ofo [facility] although I may not have the correct hospice designation. She has been having some form of compression on these areas are not really clear what they are putting on the wounds. She comes into our  clinic with 4 areas 1 on the left medial leg 1 on the right lateral leg and a more substantive area on the right dorsal foot. They tell me that she has been followed for a long period at [facility]. She has had venous ablations done in the past. I do not see that she has ever had formal arterial studies. Past medical history includes heart failure with preserved ejection fraction, hypertension, paroxysmal atrial fibrillation on Eliquis, iron deficiency anemia, T12 compression fractures, hyperlipidemia, lower extremity DVT Ossey caused osteoporosis. As mentioned she is on hospice oro Congestive heart failure s, We attempted to do ABIs in the clinic areas on the right was noncompressible she could not tolerate the left. Patient History Information obtained from Patient, Chart. Allergies naproxen, Iodinated Contrast Media Family History Cancer - Father,Siblings,Maternal Grandparents,Child, Diabetes - Siblings, Heart Disease - Siblings,Paternal Grandparents, Hypertension - Father, Stroke - Mother, No family history of Hereditary Spherocytosis, Kidney Disease, Lung Disease, Seizures, Thyroid Problems, Tuberculosis. Social History Never smoker, Marital Status - Widowed, Alcohol Use - Never, Drug Use - No History, Caffeine Use - Never. Medical History Eyes Patient has history of Glaucoma Denies history of Cataracts Ear/Nose/Mouth/Throat Denies history of Chronic sinus problems/congestion, Middle ear problems Hematologic/Lymphatic Denies history of Anemia, Hemophilia, Human Immunodeficiency Virus, Lymphedema, Sickle Cell Disease Respiratory Denies history of Aspiration, Asthma, Chronic Obstructive Pulmonary Disease (COPD), Pneumothorax, Sleep Apnea, Tuberculosis Cardiovascular Patient has history of Arrhythmia - a. flutter, a. FIB, Congestive Heart Failure, Deep Vein Thrombosis - 1998, Hypertension Denies history of Angina, Coronary Artery Disease, Hypotension, Myocardial Infarction,  Peripheral Arterial Disease, Peripheral Venous Disease, Phlebitis, Vasculitis Gastrointestinal Denies history of Cirrhosis , Colitis, Crohns, Hepatitis A, Hepatitis [name], Hepatitis C Genitourinary Denies history of End Stage Renal Disease Immunological Denies history of Lupus Erythematosus, Raynauds, Scleroderma Integumentary (Skin) Denies history of History of Burn Musculoskeletal Patient has history of Osteoarthritis Denies history of Gout, Rheumatoid Arthritis, Osteomyelitis Neurologic Denies history of Dementia, Neuropathy, Quadriplegia, Paraplegia, Seizure Disorder Oncologic Denies history of Received Chemotherapy, Received Radiation Hospitalization/Surgery History - right knee replacement 2016. - left knee replacement 2013. - pacemaker 2003. - breast lumpectomy. Medical A Surgical History Notes nd Cardiovascular PACEMAKER Gastrointestinal GERD IBS Diverticulosis Musculoskeletal Osteoporosis Oncologic basal cell Laubscher, Katheren [name] ([id number]) [id number] Page 6 of 10 Review of Systems (ROS) Eyes Complains or has symptoms of Glasses / Contacts. Denies complaints or symptoms of Dry Eyes, Vision Changes.  Ear/Nose/Mouth/Throat Denies complaints or symptoms of Chronic sinus problems or rhinitis. Respiratory Denies complaints or symptoms of Chronic or frequent coughs, Shortness of Breath. Gastrointestinal Denies complaints or symptoms of Frequent diarrhea, Nausea, Vomiting. Endocrine Denies complaints or symptoms of Heat/cold intolerance. Genitourinary Denies complaints or symptoms of Frequent urination. Integumentary (Skin) Complains or has symptoms of Wounds - both ankles. Musculoskeletal Denies complaints or symptoms of Muscle Pain, Muscle Weakness. Neurologic Denies complaints or symptoms of Numbness/parasthesias. Objective Constitutional Vitals Time Taken: 12:50 PM, Height: 63 in, Source: Stated, Weight: 135 lbs, Source: Stated, BMI:  23.9. Respiratory work of breathing is normal. Decreased air entry bilaterally but no crackles or wheezes. Cardiovascular 3 out of 6 systolic ejection murmur at the aortic area 3 out of 6 pansystolic murmur at the lower left sternal border jugular venous pressure is elevated no gallops. Scant sacral edema. Pedal pulses are palpable but reduced. Her toes are cold. Edema present in both extremities.This is nonpitting. Distally in the lower legs she has leg both lipodermatosclerosis. Atrophie blanche in the left medial lower leg.. General Notes: Wound exam; the patient has 4 wounds in the major 1 paradoxically is on the right dorsal foot she has areas on the left medial ankle right lateral ankle and small areas on the right medial ankle. She has severe venous hypertension with dilated veins in her feet and ankle area. Erythema around the wounds I think is stasis dermatitis Integumentary (Hair, Skin) Wound #1 status is Open. Original cause of wound was Gradually Appeared. The date acquired was: 05/17/2023. The wound is located on the Left,Medial Lower Leg. The wound measures 0.5cm length x 0.3cm width x 0.1cm depth; 0.118cm^2 area and 0.012cm^3 volume. There is Fat Layer (Subcutaneous Tissue) exposed. There is no tunneling or undermining noted. There is a medium amount of serosanguineous drainage noted. There is large (67-100%) red granulation within the wound bed. The periwound skin appearance did not exhibit: Callus, Crepitus, Excoriation, Induration, Rash, Scarring, Dry/Scaly, Maceration, Atrophie Blanche, Cyanosis, Ecchymosis, Hemosiderin Staining, Mottled, Pallor, Rubor, Erythema. Wound #2 status is Open. Original cause of wound was Gradually Appeared. The date acquired was: 03/01/2023. The wound is located on the Right,Dorsal Foot. The wound measures 1.7cm length x 1.5cm width x 0.2cm depth; 2.003cm^2 area and 0.401cm^3 volume. There is Fat Layer (Subcutaneous Tissue) exposed. There is no tunneling  or undermining noted. There is a medium amount of serosanguineous drainage noted. There is small (1-33%) red granulation within the wound bed. There is a large (67-100%) amount of necrotic tissue within the wound bed including Adherent Slough. The periwound skin appearance did not exhibit: Callus, Crepitus, Excoriation, Induration, Rash, Scarring, Dry/Scaly, Maceration, Atrophie Blanche, Cyanosis, Ecchymosis, Hemosiderin Staining, Mottled, Pallor, Rubor, Erythema. Wound #3 status is Open. Original cause of wound was Gradually Appeared. The date acquired was: 05/24/2023. The wound is located on the Right Lower Leg. The wound measures 1.2cm length x 1cm width x 0.1cm depth; 0.942cm^2 area and 0.094cm^3 volume. There is Fat Layer (Subcutaneous Tissue) exposed. There is no tunneling or undermining noted. There is a medium amount of serosanguineous drainage noted. There is large (67-100%) red granulation within the wound bed. There is a small (1-33%) amount of necrotic tissue within the wound bed including Adherent Slough. The periwound skin appearance did not exhibit: Callus, Crepitus, Excoriation, Induration, Rash, Scarring, Dry/Scaly, Maceration, Atrophie Blanche, Cyanosis, Ecchymosis, Hemosiderin Staining, Mottled, Pallor, Rubor, Erythema. Periwound temperature was noted as No Abnormality. Wound #4 status is Open. Original cause of wound was Gradually Appeared. The date  acquired was: 05/31/2023. The wound is located on the Right,Proximal,Dorsal Foot. The wound measures 0.3cm length x 4cm width x 0.1cm depth; 0.942cm^2 area and 0.094cm^3 volume. There is Fat Layer (Subcutaneous Tissue) exposed. There is no tunneling or undermining noted. There is a medium amount of serosanguineous drainage noted. There is large (67- 100%) red granulation within the wound bed. There is a small (1-33%) amount of necrotic tissue within the wound bed including Adherent Slough. Assessment Active Problems ICD-10 Chronic  venous hypertension (idiopathic) with inflammation of bilateral lower extremity Non-pressure chronic ulcer of other part of left lower leg with other specified severity Non-pressure chronic ulcer of other part of right lower leg with other specified severity Non-pressure chronic ulcer of other part of right foot with other specified severity [name], [name] ([id number]) [id number] Page 7 of 10 Plan Follow-up Appointments: Return appointment in 3 weeks. - Dr Mikey Bussing- ***front desk to schedule**** Bathing/ Shower/ Hygiene: May shower with protection but do not get wound dressing(s) wet. Protect dressing(s) with water repellant cover (for example, large plastic bag) or a cast cover and may then take shower. Edema Control - Orders / Instructions: Elevate legs to the level of the heart or above for 30 minutes daily and/or when sitting for 3-4 times a day throughout the day. Avoid standing for long periods of time. - Walking is ok, not standing still If compression wraps slide down please call wound center and speak with a nurse. Home Health: New wound care orders this week; continue Home Health for wound care. May utilize formulary equivalent dressing for wound treatment orders unless otherwise specified. - Moisturizer and TCA to lower legs under the compression wrap. Hydrofera blue to wound bed, ABD pad for draining, under 3-layer compression (or equivalent). Change 3x week when not being see in wound clinic, Change 2x per week when not being seen at wound clinic Other Home Health Orders/Instructions: - Hospice Home Health WOUND #1: - Lower Leg Wound Laterality: Left, Medial Cleanser: Soap and Water 3 x Per Week/30 Days Discharge Instructions: May shower and wash wound with dial antibacterial soap and water prior to dressing change. Peri-Wound Care: Triamcinolone 15 (g) 3 x Per Week/30 Days Discharge Instructions: Use triamcinolone 15 (g) as directed Peri-Wound  Care: Sween Lotion (Moisturizing lotion) 3 x Per Week/30 Days Discharge Instructions: Apply moisturizing lotion as directed Prim Dressing: Hydrofera Blue Ready Transfer Foam, 4x5 (in/in) 3 x Per Week/30 Days ary Discharge Instructions: Apply to wound bed as instructed Secondary Dressing: ABD Pad, 8x10 3 x Per Week/30 Days Discharge Instructions: Apply over primary dressing as directed. Com pression Wrap: Urgo K2 Lite, (equivalent to a 3 layer) two layer compression system, regular 3 x Per Week/30 Days Discharge Instructions: Apply Urgo K2 Lite as directed (alternative to 3 layer compression). WOUND #2: - Foot Wound Laterality: Dorsal, Right Cleanser: Soap and Water 1 x Per Week/ Discharge Instructions: May shower and wash wound with dial antibacterial soap and water prior to dressing change. Peri-Wound Care: Triamcinolone 15 (g) 1 x Per Week/ Discharge Instructions: Use triamcinolone 15 (g) as directed Peri-Wound Care: Sween Lotion (Moisturizing lotion) 1 x Per Week/ Discharge Instructions: Apply moisturizing lotion as directed Prim Dressing: Hydrofera Blue Ready Transfer Foam, 4x5 (in/in) 1 x Per Week/ ary Discharge Instructions: Apply to wound bed as instructed Secondary Dressing: ABD Pad, 8x10 1 x Per Week/ Discharge Instructions: Apply over primary dressing as directed. Com pression Wrap: Urgo K2 Lite, (equivalent to a 3 layer) two layer compression system, regular 1  x Per Week/ Discharge Instructions: Apply Urgo K2 Lite as directed (alternative to 3 layer compression). WOUND #3: - Lower Leg Wound Laterality: Right Cleanser: Soap and Water 1 x Per Week/ Discharge Instructions: May shower and wash wound with dial antibacterial soap and water prior to dressing change. Peri-Wound Care: Triamcinolone 15 (g) 1 x Per Week/ Discharge Instructions: Use triamcinolone 15 (g) as directed Peri-Wound Care: Sween Lotion (Moisturizing lotion) 1 x Per Week/ Discharge Instructions: Apply moisturizing  lotion as directed Prim Dressing: Hydrofera Blue Ready Transfer Foam, 4x5 (in/in) 1 x Per Week/ ary Discharge Instructions: Apply to wound bed as instructed Secondary Dressing: ABD Pad, 8x10 1 x Per Week/ Discharge Instructions: Apply over primary dressing as directed. Com pression Wrap: Urgo K2 Lite, (equivalent to a 3 layer) two layer compression system, regular 1 x Per Week/ Discharge Instructions: Apply Urgo K2 Lite as directed (alternative to 3 layer compression). WOUND #4: - Foot Wound Laterality: Dorsal, Right, Proximal Cleanser: Soap and Water 1 x Per Week/ Discharge Instructions: May shower and wash wound with dial antibacterial soap and water prior to dressing change. Peri-Wound Care: Triamcinolone 15 (g) 1 x Per Week/ Discharge Instructions: Use triamcinolone 15 (g) as directed Peri-Wound Care: Sween Lotion (Moisturizing lotion) 1 x Per Week/ Discharge Instructions: Apply moisturizing lotion as directed Prim Dressing: Hydrofera Blue Ready Transfer Foam, 4x5 (in/in) 1 x Per Week/ ary Discharge Instructions: Apply to wound bed as instructed Secondary Dressing: ABD Pad, 8x10 1 x Per Week/ Discharge Instructions: Apply over primary dressing as directed. Com pression Wrap: Urgo K2 Lite, (equivalent to a 3 layer) two layer compression system, regular 1 x Per Week/ Discharge Instructions: Apply Urgo K2 Lite as directed (alternative to 3 layer compression). 1. We are going to use Liberal TCA and moisturizer. 2. I think most of the erythema around her wounds on the medial ankle on the left the lateral ankle on the right and the dorsal foot is related to stasis dermatitis rather than cellulitis. This is not warm and not palpably tender. 3. Given number to I am still somewhat concerned about her arterial status. She has been going to see Dr. Guss Bunde at [facility] for a while. I would be surprised if there were not full arterial studies. Will try to reach out here. 4. I  had a discussion with the patient about the designation of hospice care however was very clear to me that the son wants aggressive care of his mother's wounds towards this and I have ordered predominantly Hydrofera Blue, ABDs under Urgo K2 lite's. Without knowing more about her arterial status I would be concerned about wrapping more aggressively. 5. We are going to send these orders into hospice. I do not know how they will react to this I went over this with the patient and her son Addendum; I looked over her notes from [facility] dating back to July 04, 2022. I could not find any reference to arterial studies at all. Electronic Signature(s) Signed: 06/05/2023 11:51:18 AM By: Baltazar Najjar MD Gloeckner, [place] ([id number]) By: Baltazar Najjar MD [phone].pdf Page 8 of 10 Signed: 06/05/2023 11:51:18 AM Previous Signature: 05/31/2023 5:18:32 PM Version By: Baltazar Najjar MD Entered By: Baltazar Najjar on 06/04/2023 13:27:01 -------------------------------------------------------------------------------- HxROS Details Patient Name: Date of Service: [name], [name]. 05/31/2023 12:30 PM Medical Record Number: [id number] Patient Account Number: [id number] Date of Birth/Sex: Treating RN: [birth year] (89 y.o. Arta Silence Primary Care Provider: Dettinger, Ivin Booty Other Clinician: Referring Provider: Treating  Provider/Extender: Baltazar Najjar Dettinger, Larence Penning in Treatment: 0 Information Obtained From Patient Chart Eyes Complaints and Symptoms: Positive for: Glasses / Contacts Negative for: Dry Eyes; Vision Changes Medical History: Positive for: Glaucoma Negative for: Cataracts Ear/Nose/Mouth/Throat Complaints and Symptoms: Negative for: Chronic sinus problems or rhinitis Medical History: Negative for: Chronic sinus problems/congestion; Middle ear problems Respiratory Complaints and Symptoms: Negative for: Chronic or frequent coughs;  Shortness of Breath Medical History: Negative for: Aspiration; Asthma; Chronic Obstructive Pulmonary Disease (COPD); Pneumothorax; Sleep Apnea; Tuberculosis Gastrointestinal Complaints and Symptoms: Negative for: Frequent diarrhea; Nausea; Vomiting Medical History: Negative for: Cirrhosis ; Colitis; Crohns; Hepatitis A; Hepatitis [name]; Hepatitis C Past Medical History Notes: GERD IBS Diverticulosis Endocrine Complaints and Symptoms: Negative for: Heat/cold intolerance Genitourinary Complaints and Symptoms: Negative for: Frequent urination Medical History: Negative for: End Stage Renal Disease Integumentary (Skin) Complaints and Symptoms: Positive for: Wounds - both ankles Medical History: Negative for: History of Burn ARTHENA, BOETCHER [name] ([id number]) [id number] Page 9 of 10 Musculoskeletal Complaints and Symptoms: Negative for: Muscle Pain; Muscle Weakness Medical History: Positive for: Osteoarthritis Negative for: Gout; Rheumatoid Arthritis; Osteomyelitis Past Medical History Notes: Osteoporosis Neurologic Complaints and Symptoms: Negative for: Numbness/parasthesias Medical History: Negative for: Dementia; Neuropathy; Quadriplegia; Paraplegia; Seizure Disorder Hematologic/Lymphatic Medical History: Negative for: Anemia; Hemophilia; Human Immunodeficiency Virus; Lymphedema; Sickle Cell Disease Cardiovascular Medical History: Positive for: Arrhythmia - a. flutter, a. FIB; Congestive Heart Failure; Deep Vein Thrombosis - 1998; Hypertension Negative for: Angina; Coronary Artery Disease; Hypotension; Myocardial Infarction; Peripheral Arterial Disease; Peripheral Venous Disease; Phlebitis; Vasculitis Past Medical History Notes: PACEMAKER Immunological Medical History: Negative for: Lupus Erythematosus; Raynauds; Scleroderma Oncologic Medical History: Negative for: Received Chemotherapy; Received Radiation Past Medical History Notes: basal  cell HBO Extended History Items Eyes: Glaucoma Immunizations Pneumococcal Vaccine: Received Pneumococcal Vaccination: Yes Received Pneumococcal Vaccination On or After 60th Birthday: Yes Implantable Devices No devices added Hospitalization / Surgery History Type of Hospitalization/Surgery right knee replacement 2016 left knee replacement 2013 pacemaker 2003 breast lumpectomy Family and Social History Cancer: Yes - Father,Siblings,Maternal Grandparents,Child; Diabetes: Yes - Siblings; Heart Disease: Yes - Siblings,Paternal Grandparents; Hereditary Spherocytosis: No; Hypertension: Yes - Father; Kidney Disease: No; Lung Disease: No; Seizures: No; Stroke: Yes - Mother; Thyroid Problems: No; Tuberculosis: No; Never smoker; Marital Status - Widowed; Alcohol Use: Never; Drug Use: No History; Caffeine Use: Never Social Determinants of Health (SDOH) 1. In the past 2 months, did you or others you live with eat smaller meals or skip meals because you didn't have money for foodo : No 2. Are you homeless or worried that you might be in the futureo : No 3. Do you have trouble paying for your utilities (gas, electricity, phone)o : No 4. Do you have trouble finding or paying for a rideo : No 5. Do you need daycare, or better daycare, for your kidso : No 6. Are you unemployed or without regular incomeo : No 7. Do you need help finding a better jobo : No 8. Do you need help getting more educationo : No 9. Are you concerned about someone in your home using drugs or alcoholo : No [name], [name] ([id number]) [id number] Page 10 of 10 10. Do you feel unsafe in your daily lifeo : No 11. Is anyone in your home threatening or abusing youo : No 12. Do you lack quality relationships that make you feel valued and supportedo : No 13. Do you need help getting cultural information in a language you understando : No 14. Do you need help getting  internet accesso : No Advanced  Directives and Instructions Spiritual or Cultural beliefs preclude asking about Advance Care Planning: No Advanced Directives: Yes; cone has a copy. Copy Provided: No Do not resuscitate: No Living Will: No Medical Power of Attorney: No Surrogate Decision Maker: No Electronic Signature(s) Signed: 05/31/2023 5:18:32 PM By: Baltazar Najjar MD Signed: 05/31/2023 6:09:01 PM By: Shawn Stall RN, BSN Signed: 06/07/2023 4:00:57 PM By: Thayer Dallas Entered By: Thayer Dallas on 05/31/2023 09:55:46 -------------------------------------------------------------------------------- SuperBill Details Patient Name: Date of Service: [name], [name] 05/31/2023 Medical Record Number: [id number] Patient Account Number: [id number] Date of Birth/Sex: Treating RN: Aug 04, 1928 (89 y.o. F) Primary Care Provider: Dettinger, Ivin Booty Other Clinician: Referring Provider: Treating Provider/Extender: Claudean Severance, Larence Penning in Treatment: 0 Diagnosis Coding ICD-10 Codes Code Description [phone] Chronic venous hypertension (idiopathic) with inflammation of bilateral lower extremity L97.828 Non-pressure chronic ulcer of other part of left lower leg with other specified severity L97.818 Non-pressure chronic ulcer of other part of right lower leg with other specified severity L97.518 Non-pressure chronic ulcer of other part of right foot with other specified severity Facility Procedures : CPT4: Code [id number] Description: 13 - WOUND CARE VISIT-LEV 3 EST PT Modifier: Quantity: 1 : CPT4: [id number] Description: 81 BILATERAL: Application of multi-layer venous compression system; leg (below knee), including ankle and t. Modifier: Quantity: 1 Physician Procedures : CPT4 Code Description Modifier [id number] - WC PHYS LEVEL 4 - NEW PT ICD-10 Diagnosis Description I87.323 Chronic venous hypertension (idiopathic) with inflammation of bilateral lower extremity L97.828 Non-pressure  chronic ulcer of other part of  left lower leg with other specified severity L97.818 Non-pressure chronic ulcer of other part of right lower leg with other specified severity L97.518 Non-pressure chronic ulcer of other part of right foot with other specified severity Quantity: 1 Electronic Signature(s) Signed: 05/31/2023 5:51:07 PM By: Redmond Pulling RN, BSN Signed: 06/05/2023 11:51:18 AM By: Baltazar Najjar MD Previous Signature: 05/31/2023 5:18:32 PM Version By: Baltazar Najjar MD Entered By: Redmond Pulling on 05/31/2023 14:18:23

## 2023-06-07 NOTE — Progress Notes (Signed)
ROSELIN, CANCELLIERI B ([id number]) [phone].pdf Page 1 of 4 Visit Report for 05/31/2023 Abuse Risk Screen Details Patient Name: Date of Service: [name] 05/31/2023 12:30 PM Medical Record Number: [id number] Patient Account Number: [id number] Date of Birth/Sex: Treating RN: 1929/04/29 (89 y.o. F) Primary Care Creedon Danielski: Dettinger, Ivin Booty Other Clinician: Referring Yelitza Reach: Treating Jayvier Burgher/Extender: Claudean Severance, Larence Penning in Treatment: 0 Abuse Risk Screen Items Answer ABUSE RISK SCREEN: Has anyone close to you tried to hurt or harm you recentlyo No Do you feel uncomfortable with anyone in your familyo No Has anyone forced you do things that you didnt want to doo No Electronic Signature(s) Signed: 06/07/2023 4:00:57 PM By: Thayer Dallas Entered By: Thayer Dallas on 05/31/2023 09:57:58 -------------------------------------------------------------------------------- Activities of Daily Living Details Patient Name: Date of Service: [name] 05/31/2023 12:30 PM Medical Record Number: [id number] Patient Account Number: [id number] Date of Birth/Sex: Treating RN: July 13, 1928 (89 y.o. F) Primary Care Monnie Anspach: Dettinger, Ivin Booty Other Clinician: Referring Saraih Lorton: Treating Brodric Schauer/Extender: Claudean Severance, Larence Penning in Treatment: 0 Activities of Daily Living Items Answer Activities of Daily Living (Please select one for each item) Drive Automobile Not Able T Medications ake Need Assistance Use T elephone Need Assistance Care for Appearance Completely Able Use T oilet Completely Able Bath / Shower Completely Able Dress Self Completely Able Feed Self Completely Able Walk Not Able Get In / Out Bed Need Assistance Housework Not Able Prepare Meals Not Able Handle Money Not Able Shop for Self Not Able Electronic Signature(s) Signed: 06/07/2023 4:00:57 PM By: Thayer Dallas Entered By:  Thayer Dallas on 05/31/2023 09:58:55 Mavis, Ted Mcalpine ([id number]) [id number] Page 2 of 4 -------------------------------------------------------------------------------- Education Screening Details Patient Name: Date of Service: [name] 05/31/2023 12:30 PM Medical Record Number: [id number] Patient Account Number: [id number] Date of Birth/Sex: Treating RN: Jun 26, 1928 (89 y.o. F) Primary Care Yameli Delamater: Dettinger, Ivin Booty Other Clinician: Referring Wylene Weissman: Treating Dewel Lotter/Extender: Baltazar Najjar Dettinger, Larence Penning in Treatment: 0 Primary Learner Assessed: Patient Learning Preferences/Education Level/Primary Language Learning Preference: Explanation, Demonstration, Printed Material Highest Education Level: High School Preferred Language: English Cognitive Barrier Language Barrier: No Translator Needed: No Memory Deficit: No Emotional Barrier: No Cultural/Religious Beliefs Affecting Medical Care: No Physical Barrier Impaired Vision: Yes Glasses Impaired Hearing: No Decreased Hand dexterity: No Knowledge/Comprehension Knowledge Level: High Comprehension Level: High Ability to understand written instructions: High Ability to understand verbal instructions: High Motivation Anxiety Level: Calm Cooperation: Cooperative Education Importance: Acknowledges Need Interest in Health Problems: Asks Questions Perception: Coherent Willingness to Engage in Self-Management High Activities: Readiness to Engage in Self-Management High Activities: Electronic Signature(s) Signed: 06/07/2023 4:00:57 PM By: Thayer Dallas Entered By: Thayer Dallas on 05/31/2023 09:59:25 -------------------------------------------------------------------------------- Fall Risk Assessment Details Patient Name: Date of Service: [name], [name] 05/31/2023 12:30 PM Medical Record Number: [id number] Patient Account Number: [id number] Date of  Birth/Sex: Treating RN: 1928/11/12 (89 y.o. F) Primary Care Gyanna Jarema: Dettinger, Ivin Booty Other Clinician: Referring Chellsie Gomer: Treating Kolden Dupee/Extender: Claudean Severance, Larence Penning in Treatment: 0 Fall Risk Assessment Items Have you had 2 or more falls in the last 12 monthso 0 Yes Mcvicar, Timisha B ([id number]) [id number] Page 3 of 4 Have you had any fall that resulted in injury in the last 12 monthso 0 Yes FALLS RISK SCREEN History of falling - immediate or within 3 months 25 Yes Secondary diagnosis (Do you have 2 or more medical diagnoseso) 0 No Ambulatory aid None/bed rest/wheelchair/nurse 0 No Crutches/cane/walker 15 Yes Furniture 0 No  Intravenous therapy Access/Saline/Heparin Lock 0 No Gait/Transferring Normal/ bed rest/ wheelchair 0 No Weak (short steps with or without shuffle, stooped but able to lift head while walking, may seek 10 Yes support from furniture) Impaired (short steps with shuffle, may have difficulty arising from chair, head down, impaired 0 No balance) Mental Status Oriented to own ability 0 Yes Electronic Signature(s) Signed: 06/07/2023 4:00:57 PM By: Thayer Dallas Entered By: Thayer Dallas on 05/31/2023 10:00:48 -------------------------------------------------------------------------------- Foot Assessment Details Patient Name: Date of Service: [name], [name] 05/31/2023 12:30 PM Medical Record Number: [id number] Patient Account Number: [id number] Date of Birth/Sex: Treating RN: [birth year] (89 y.o. F) Primary Care Jun Osment: Dettinger, Ivin Booty Other Clinician: Referring Kristol Almanzar: Treating Abbigal Radich/Extender: Claudean Severance, Larence Penning in Treatment: 0 Foot Assessment Items Site Locations + = Sensation present, - = Sensation absent, C = Callus, U = Ulcer R = Redness, W = Warmth, M = Maceration, PU = Pre-ulcerative lesion F = Fissure, S = Swelling, D = Dryness Assessment Right:  Left: Other Deformity: No No Prior Foot Ulcer: No No Prior Amputation: No No Charcot Joint: No No Ambulatory Status: Ambulatory With Help Assistance Device: AMYMARIE, GAL ([id number]) [id number].pdf Page 4 of 4 Gait: Surveyor, mining) Signed: 06/07/2023 4:00:57 PM By: Thayer Dallas Entered By: Thayer Dallas on 05/31/2023 10:15:04 -------------------------------------------------------------------------------- Nutrition Risk Screening Details Patient Name: Date of Service: [name] 05/31/2023 12:30 PM Medical Record Number: [id number] Patient Account Number: [id number] Date of Birth/Sex: Treating RN: July 26, 1928 (89 y.o. F) Primary Care Daquana Paddock: Dettinger, Ivin Booty Other Clinician: Referring Jaythen Hamme: Treating Sherod Cisse/Extender: Claudean Severance, Larence Penning in Treatment: 0 Height (in): 63 Weight (lbs): 135 Body Mass Index (BMI): 23.9 Nutrition Risk Screening Items Score Screening NUTRITION RISK SCREEN: I have an illness or condition that made me change the kind and/or amount of food I eat 0 No I eat fewer than two meals per day 0 No I eat few fruits and vegetables, or milk products 0 No I have three or more drinks of beer, liquor or wine almost every day 0 No I have tooth or mouth problems that make it hard for me to eat 0 No I don't always have enough money to buy the food I need 0 No I eat alone most of the time 0 No I take three or more different prescribed or over-the-counter drugs a day 1 Yes Without wanting to, I have lost or gained 10 pounds in the last six months 2 Yes I am not always physically able to shop, cook and/or feed myself 0 No Nutrition Protocols Good Risk Protocol Moderate Risk Protocol 0 Provide education on nutrition High Risk Proctocol Risk Level: Moderate Risk Score: 3 Electronic Signature(s) Signed: 06/07/2023 4:00:57 PM By: Thayer Dallas Entered By: Thayer Dallas on 05/31/2023 10:01:20

## 2023-06-14 ENCOUNTER — Encounter (HOSPITAL_BASED_OUTPATIENT_CLINIC_OR_DEPARTMENT_OTHER): Payer: Medicare Other | Attending: Internal Medicine | Admitting: Internal Medicine

## 2023-06-14 DIAGNOSIS — L03116 Cellulitis of left lower limb: Secondary | ICD-10-CM | POA: Insufficient documentation

## 2023-06-14 DIAGNOSIS — I110 Hypertensive heart disease with heart failure: Secondary | ICD-10-CM | POA: Insufficient documentation

## 2023-06-14 DIAGNOSIS — I480 Paroxysmal atrial fibrillation: Secondary | ICD-10-CM | POA: Insufficient documentation

## 2023-06-14 DIAGNOSIS — L97518 Non-pressure chronic ulcer of other part of right foot with other specified severity: Secondary | ICD-10-CM | POA: Insufficient documentation

## 2023-06-14 DIAGNOSIS — Z7901 Long term (current) use of anticoagulants: Secondary | ICD-10-CM | POA: Insufficient documentation

## 2023-06-14 DIAGNOSIS — L97528 Non-pressure chronic ulcer of other part of left foot with other specified severity: Secondary | ICD-10-CM | POA: Diagnosis not present

## 2023-06-14 DIAGNOSIS — Z86718 Personal history of other venous thrombosis and embolism: Secondary | ICD-10-CM | POA: Diagnosis not present

## 2023-06-14 DIAGNOSIS — I87323 Chronic venous hypertension (idiopathic) with inflammation of bilateral lower extremity: Secondary | ICD-10-CM | POA: Insufficient documentation

## 2023-06-14 DIAGNOSIS — L97818 Non-pressure chronic ulcer of other part of right lower leg with other specified severity: Secondary | ICD-10-CM | POA: Insufficient documentation

## 2023-06-14 DIAGNOSIS — L97828 Non-pressure chronic ulcer of other part of left lower leg with other specified severity: Secondary | ICD-10-CM | POA: Insufficient documentation

## 2023-06-14 DIAGNOSIS — I5032 Chronic diastolic (congestive) heart failure: Secondary | ICD-10-CM | POA: Diagnosis not present

## 2023-06-14 NOTE — Progress Notes (Signed)
 Watkinson, [name] ([id number]) [id number] Page 1 of 7 Visit Report for 06/14/2023 HPI Details Patient Name: Date of Service: [name] 06/14/2023 3:15 PM Medical Record Number: [id number] Patient Account Number: [id number] Date of Birth/Sex: Treating RN: 08-Oct-1928 (89 y.o. F) Primary Care Provider: Dettinger, Fonda Other Clinician: Referring Provider: Treating Provider/Extender: Rufus Ozell Levins, Fonda Duos in Treatment: 2 History of Present Illness HPI Description: ADMISSION 05/31/2023 This is a 89 year old woman who lives in [place] . She is here with her son and daughter-in-law. I can see that she was in hospital in October with cellulitis of the right foot. An x-ray was negative although it did show that she had had subacute fractures of the neck of the fourth and fifth metatarsals. She was treated with IV antibiotics but discharged on amoxicillin . She was also seen I think in urgent care in September with chronic ulcers after a fall. She was felt to have cellulitis of her right lower extremity at that point. Currently she is under hospice ofo [facility] although I may not have the correct hospice designation. She has been having some form of compression on these areas are not really clear what they are putting on the wounds. She comes into our clinic with 4 areas 1 on the left medial leg 1 on the right lateral leg and a more substantive area on the right dorsal foot. They tell me that she has been followed for a long period at [facility]. She has had venous ablations done in the past. I do not see that she has ever had formal arterial studies. Past medical history includes heart failure with preserved ejection fraction, hypertension, paroxysmal atrial fibrillation on Eliquis , iron  deficiency anemia, T12 compression fractures, hyperlipidemia, lower extremity DVT Ossey caused osteoporosis. As mentioned she is on  hospice oro Congestive heart failure s, We attempted to do ABIs in the clinic areas on the right was noncompressible she could not tolerate the left. 06/14/2023; this the patient admitted to the clinic 2 weeks ago. She has severe venous hypertension and had wounds on her left medial lower leg right dorsal foot and right lateral leg. We put her in kerlix Coban out of some fear of arterial insufficiency. She had a nurse visit last week and she arrives in the clinic today with Urgo K2 lite's. Also concerning is an area on the right upper leg of erythema.o Cellulitis. We switched her to physician visit today I looked through the notes from [facility] I could not find any reference to arterial evaluation. Electronic Signature(s) Signed: 06/14/2023 4:03:36 PM By: Rufus Ozell MD Entered By: Rufus Ozell on 06/14/2023 15:58:47 -------------------------------------------------------------------------------- Physical Exam Details Patient Name: Date of Service: [name], [name] 06/14/2023 3:15 PM Medical Record Number: [id number] Patient Account Number: [id number] Date of Birth/Sex: Treating RN: [birth year] (89 y.o. F) Primary Care Provider: Dettinger, Fonda Other Clinician: Referring Provider: Treating Provider/Extender: Rufus Ozell Levins, Fonda Duos in Treatment: 2 Cardiovascular On the left I think I can feel her dorsalis pedis and posterior tibial but not on the right. Nothing looks acutely threatened here however. Notes Wound exam; the patient has 3 wounds 1 on the right dorsal foot, 1 on the left medial lower leg and one on the right lateral lower leg that is eschared. All of this looks a lot better. Her edema is a lot better. On the right anterior upper leg which would be just under where her wraps are there is not linear area  of erythema that is exquisitely tender and indeed no doubt could be cellulitis Electronic Signature(s) Howdyshell, Analleli  [name]  ([id number]) [phone].pdf Page 2 of 7 Signed: 06/14/2023 4:03:36 PM By: Rufus Sharper MD Entered By: Rufus Sharper on 06/14/2023 15:58:18 -------------------------------------------------------------------------------- Physician Orders Details Patient Name: Date of Service: [name], [name] 06/14/2023 3:15 PM Medical Record Number: [id number] Patient Account Number: [id number] Date of Birth/Sex: Treating RN: 1928-07-23 (89 y.o. [name] Cammie Sailors Primary Care Provider: Dettinger, Fonda Other Clinician: Referring Provider: Treating Provider/Extender: Rufus Sharper Dettinger, Fonda Duos in Treatment: 2 Verbal / Phone Orders: No Diagnosis Coding ICD-10 Coding Code Description [phone] Chronic venous hypertension (idiopathic) with inflammation of bilateral lower extremity L97.828 Non-pressure chronic ulcer of other part of left lower leg with other specified severity L97.818 Non-pressure chronic ulcer of other part of right lower leg with other specified severity L97.518 Non-pressure chronic ulcer of other part of right foot with other specified severity L03.116 Cellulitis of left lower limb Follow-up Appointments ppointment in 1 week. - Dr Rosan - already scheduled Return A Bathing/ Shower/ Hygiene May shower with protection but do not get wound dressing(s) wet. Protect dressing(s) with water repellant cover (for example, large plastic bag) or a cast cover and may then take shower. Edema Control - Orders / Instructions Bilateral Lower Extremities Elevate legs to the level of the heart or above for 30 minutes daily and/or when sitting for 3-4 times a day throughout the day. void standing for long periods of time. - Walking is ok, not standing still A If compression wraps slide down please call wound center and speak with a nurse. Home Health New wound care orders this week; continue Home Health for wound care. May utilize formulary equivalent  dressing for wound treatment orders unless otherwise specified. - Wrap only left leg with kerlix/coban, Moisturizer and TCA to lower legs under the compression wrap. Hydrofera blue to all wound beds, ABD pad for draining. Right leg: hydrofera blue to wounds, cover with ABD, secure wtih kerlix, cover with tubigrip Change 3x week when not being see in wound clinic, Change 2x per week when not being seen at wound clinic Other Home Health Orders/Instructions: - [facility] fax: [phone] Wound Treatment Wound #1 - Lower Leg Wound Laterality: Left, Medial Cleanser: Soap and Water 3 x Per Week/30 Days Discharge Instructions: May shower and wash wound with dial antibacterial soap and water prior to dressing change. Peri-Wound Care: Triamcinolone  15 (g) 3 x Per Week/30 Days Discharge Instructions: Use triamcinolone  15 (g) as directed Peri-Wound Care: Sween Lotion (Moisturizing lotion) 3 x Per Week/30 Days Discharge Instructions: Apply moisturizing lotion as directed Prim Dressing: Hydrofera Blue Ready Transfer Foam, 4x5 (in/in) 3 x Per Week/30 Days ary Discharge Instructions: Apply to wound bed as instructed Secondary Dressing: ABD Pad, 8x10 3 x Per Week/30 Days Discharge Instructions: Apply over primary dressing as directed. Compression Wrap: Kerlix Roll 4.5x3.1 (in/yd) 3 x Per Week/30 Days Discharge Instructions: Apply Kerlix and Coban compression as directed. Compression Wrap: Coban Self-Adherent Wrap 4x5 (in/yd) 3 x Per Week/30 Days Discharge Instructions: Apply over Kerlix as directed. Wound #2 - Foot Wound Laterality: Dorsal, Right Cleanser: Soap and Water 1 x Per Week Matar, Lillis  [name] ([id number]) [id number] Page 3 of 7 Discharge Instructions: May shower and wash wound with dial antibacterial soap and water prior to dressing change. Peri-Wound Care: Triamcinolone  15 (g) 1 x Per Week Discharge Instructions: Use triamcinolone  15 (g) as  directed Peri-Wound Care: Sween Lotion (Moisturizing lotion) 1 x  Per Week Discharge Instructions: Apply moisturizing lotion as directed Prim Dressing: Hydrofera Blue Ready Transfer Foam, 4x5 (in/in) 1 x Per Week ary Discharge Instructions: Apply to wound bed as instructed Secondary Dressing: ABD Pad, 8x10 1 x Per Week Discharge Instructions: Apply over primary dressing as directed. Secured With: [employer], 4.5x3.1 (in/yd) 1 x Per Week Discharge Instructions: Secure with Kerlix as directed. Secured With: Tubigrip 1 x Per Week Wound #3 - Lower Leg Wound Laterality: Right Cleanser: Soap and Water 1 x Per Week Discharge Instructions: May shower and wash wound with dial antibacterial soap and water prior to dressing change. Peri-Wound Care: Triamcinolone  15 (g) 1 x Per Week Discharge Instructions: Use triamcinolone  15 (g) as directed Peri-Wound Care: Sween Lotion (Moisturizing lotion) 1 x Per Week Discharge Instructions: Apply moisturizing lotion as directed Prim Dressing: Hydrofera Blue Ready Transfer Foam, 4x5 (in/in) 1 x Per Week ary Discharge Instructions: Apply to wound bed as instructed Secondary Dressing: ABD Pad, 8x10 1 x Per Week Discharge Instructions: Apply over primary dressing as directed. Compression Wrap: Urgo K2 Lite, (equivalent to a 3 layer) two layer compression system, regular 1 x Per Week Discharge Instructions: Apply Urgo K2 Lite as directed (alternative to 3 layer compression). Wound #4 - Foot Wound Laterality: Dorsal, Right, Proximal Cleanser: Soap and Water 1 x Per Week Discharge Instructions: May shower and wash wound with dial antibacterial soap and water prior to dressing change. Peri-Wound Care: Triamcinolone  15 (g) 1 x Per Week Discharge Instructions: Use triamcinolone  15 (g) as directed Peri-Wound Care: Sween Lotion (Moisturizing lotion) 1 x Per Week Discharge Instructions: Apply moisturizing lotion as directed Prim Dressing: Hydrofera Blue Ready  Transfer Foam, 4x5 (in/in) 1 x Per Week ary Discharge Instructions: Apply to wound bed as instructed Secondary Dressing: ABD Pad, 8x10 1 x Per Week Discharge Instructions: Apply over primary dressing as directed. Compression Wrap: Urgo K2 Lite, (equivalent to a 3 layer) two layer compression system, regular 1 x Per Week Discharge Instructions: Apply Urgo K2 Lite as directed (alternative to 3 layer compression). Patient Medications llergies: naproxen, Iodinated Contrast Media A Notifications Medication Indication Start End cellulitis right leg 06/14/2023 cephalexin DOSE oral 500 mg capsule - 1 capsule oral three times daily for 7 days lidocaine  DOSE topical 4 % cream - cream topical once daily Electronic Signature(s) Signed: 06/14/2023 4:27:30 PM By: Cammie Sailors RN, BSN Previous Signature: 06/14/2023 4:02:24 PM Version By: Rufus Sharper MD Entered By: Cammie Sailors on 06/14/2023 16:21:27 Blaker, [name] ([id number]) [id number] Page 4 of 7 -------------------------------------------------------------------------------- Problem List Details Patient Name: Date of Service: [name], [name] IRGINIA [name]. 06/14/2023 3:15 PM Medical Record Number: [id number] Patient Account Number: [id number] Date of Birth/Sex: Treating RN: Feb 24, 1929 (89 y.o. F) Primary Care Provider: Dettinger, Fonda Other Clinician: Referring Provider: Treating Provider/Extender: Rufus Sharper Levins, Fonda Duos in Treatment: 2 Active Problems ICD-10 Encounter Code Description Active Date MDM Diagnosis I87.323 Chronic venous hypertension (idiopathic) with inflammation of bilateral lower 05/31/2023 No Yes extremity L97.828 Non-pressure chronic ulcer of other part of left lower leg with other specified 05/31/2023 No Yes severity L97.818 Non-pressure chronic ulcer of other part of right lower leg with other specified 05/31/2023 No Yes severity L97.518 Non-pressure chronic ulcer of  other part of right foot with other specified 05/31/2023 No Yes severity L03.116 Cellulitis of left lower limb 06/14/2023 No Yes Inactive Problems Resolved Problems Electronic Signature(s) Signed: 06/14/2023 4:03:36 PM By: Rufus Sharper MD Entered By: Rufus Sharper on 06/14/2023 15:52:28 -------------------------------------------------------------------------------- Progress Note Details Patient Name: Date  of Service: [name] 06/14/2023 3:15 PM Medical Record Number: [id number] Patient Account Number: [id number] Date of Birth/Sex: Treating RN: [birth year] (89 y.o. F) Primary Care Provider: Dettinger, Fonda Other Clinician: Referring Provider: Treating Provider/Extender: Rufus Ozell Levins, Fonda Duos in Treatment: 2 Subjective History of Present Illness (HPI) ADMISSION Kann, [name] ([id number]) [id number] Page 5 of 7 05/31/2023 This is a 89 year old woman who lives in [place] . She is here with her son and daughter-in-law. I can see that she was in hospital in October with cellulitis of the right foot. An x-ray was negative although it did show that she had had subacute fractures of the neck of the fourth and fifth metatarsals. She was treated with IV antibiotics but discharged on amoxicillin . She was also seen I think in urgent care in September with chronic ulcers after a fall. She was felt to have cellulitis of her right lower extremity at that point. Currently she is under hospice ofo [facility] although I may not have the correct hospice designation. She has been having some form of compression on these areas are not really clear what they are putting on the wounds. She comes into our clinic with 4 areas 1 on the left medial leg 1 on the right lateral leg and a more substantive area on the right dorsal foot. They tell me that she has been followed for a long period at [facility]. She has had venous  ablations done in the past. I do not see that she has ever had formal arterial studies. Past medical history includes heart failure with preserved ejection fraction, hypertension, paroxysmal atrial fibrillation on Eliquis , iron  deficiency anemia, T12 compression fractures, hyperlipidemia, lower extremity DVT Ossey caused osteoporosis. As mentioned she is on hospice oro Congestive heart failure s, We attempted to do ABIs in the clinic areas on the right was noncompressible she could not tolerate the left. 06/14/2023; this the patient admitted to the clinic 2 weeks ago. She has severe venous hypertension and had wounds on her left medial lower leg right dorsal foot and right lateral leg. We put her in kerlix Coban out of some fear of arterial insufficiency. She had a nurse visit last week and she arrives in the clinic today with Urgo K2 lite's. Also concerning is an area on the right upper leg of erythema.o Cellulitis. We switched her to physician visit today I looked through the notes from [facility] I could not find any reference to arterial evaluation. Objective Cardiovascular On the left I think I can feel her dorsalis pedis and posterior tibial but not on the right. Nothing looks acutely threatened here however. General Notes: Wound exam; the patient has 3 wounds 1 on the right dorsal foot, 1 on the left medial lower leg and one on the right lateral lower leg that is eschared. All of this looks a lot better. Her edema is a lot better. On the right anterior upper leg which would be just under where her wraps are there is not linear area of erythema that is exquisitely tender and indeed no doubt could be cellulitis Assessment Active Problems ICD-10 Chronic venous hypertension (idiopathic) with inflammation of bilateral lower extremity Non-pressure chronic ulcer of other part of left lower leg with other specified severity Non-pressure chronic ulcer of other part of right lower leg  with other specified severity Non-pressure chronic ulcer of other part of right foot with other specified severity Cellulitis of left lower limb Plan Follow-up Appointments:  Return Appointment in 1 week. - Dr Rosan - already scheduled Bathing/ Shower/ Hygiene: May shower with protection but do not get wound dressing(s) wet. Protect dressing(s) with water repellant cover (for example, large plastic bag) or a cast cover and may then take shower. Edema Control - Orders / Instructions: Elevate legs to the level of the heart or above for 30 minutes daily and/or when sitting for 3-4 times a day throughout the day. Avoid standing for long periods of time. - Walking is ok, not standing still If compression wraps slide down please call wound center and speak with a nurse. Home Health: New wound care orders this week; continue Home Health for wound care. May utilize formulary equivalent dressing for wound treatment orders unless otherwise specified. - Wrap only left leg with kerlix/coban, Moisturizer and TCA to lower legs under the compression wrap. Hydrofera blue to all wound beds, ABD pad for draining. Right leg: hydrofera blue to wounds, cover with ABD, secure wtih kerlix, cover with tubigrip Change 3x week when not being see in wound clinic, Change 2x per week when not being seen at wound clinic Other Home Health Orders/Instructions: - [facility] fax: [phone] WOUND #1: - Lower Leg Wound Laterality: Left, Medial Cleanser: Soap and Water 3 x Per Week/30 Days Discharge Instructions: May shower and wash wound with dial antibacterial soap and water prior to dressing change. Peri-Wound Care: Triamcinolone  15 (g) 3 x Per Week/30 Days Discharge Instructions: Use triamcinolone  15 (g) as directed Peri-Wound Care: Sween Lotion (Moisturizing lotion) 3 x Per Week/30 Days Discharge Instructions: Apply moisturizing lotion as directed Prim Dressing: Hydrofera Blue Ready Transfer Foam, 4x5  (in/in) 3 x Per Week/30 Days ary Discharge Instructions: Apply to wound bed as instructed Secondary Dressing: ABD Pad, 8x10 3 x Per Week/30 Days Discharge Instructions: Apply over primary dressing as directed. Hackert, [name] ([id number]) [id number] Page 6 of 7 Com pression Wrap: Kerlix Roll 4.5x3.1 (in/yd) 3 x Per Week/30 Days Discharge Instructions: Apply Kerlix and Coban compression as directed. Com pression Wrap: Coban Self-Adherent Wrap 4x5 (in/yd) 3 x Per Week/30 Days Discharge Instructions: Apply over Kerlix as directed. WOUND #2: - Foot Wound Laterality: Dorsal, Right Cleanser: Soap and Water 1 x Per Week/ Discharge Instructions: May shower and wash wound with dial antibacterial soap and water prior to dressing change. Peri-Wound Care: Triamcinolone  15 (g) 1 x Per Week/ Discharge Instructions: Use triamcinolone  15 (g) as directed Peri-Wound Care: Sween Lotion (Moisturizing lotion) 1 x Per Week/ Discharge Instructions: Apply moisturizing lotion as directed Prim Dressing: Hydrofera Blue Ready Transfer Foam, 4x5 (in/in) 1 x Per Week/ ary Discharge Instructions: Apply to wound bed as instructed Secondary Dressing: ABD Pad, 8x10 1 x Per Week/ Discharge Instructions: Apply over primary dressing as directed. Secured With: [employer], 4.5x3.1 (in/yd) 1 x Per Week/ Discharge Instructions: Secure with Kerlix as directed. Secured With: Tubigrip 1 x Per Week/ WOUND #3: - Lower Leg Wound Laterality: Right Cleanser: Soap and Water 1 x Per Week/ Discharge Instructions: May shower and wash wound with dial antibacterial soap and water prior to dressing change. Peri-Wound Care: Triamcinolone  15 (g) 1 x Per Week/ Discharge Instructions: Use triamcinolone  15 (g) as directed Peri-Wound Care: Sween Lotion (Moisturizing lotion) 1 x Per Week/ Discharge Instructions: Apply moisturizing lotion as directed Prim Dressing: Hydrofera Blue Ready Transfer Foam, 4x5  (in/in) 1 x Per Week/ ary Discharge Instructions: Apply to wound bed as instructed Secondary Dressing: ABD Pad, 8x10 1 x Per Week/ Discharge Instructions: Apply  over primary dressing as directed. Com pression Wrap: Urgo K2 Lite, (equivalent to a 3 layer) two layer compression system, regular 1 x Per Week/ Discharge Instructions: Apply Urgo K2 Lite as directed (alternative to 3 layer compression). WOUND #4: - Foot Wound Laterality: Dorsal, Right, Proximal Cleanser: Soap and Water 1 x Per Week/ Discharge Instructions: May shower and wash wound with dial antibacterial soap and water prior to dressing change. Peri-Wound Care: Triamcinolone  15 (g) 1 x Per Week/ Discharge Instructions: Use triamcinolone  15 (g) as directed Peri-Wound Care: Sween Lotion (Moisturizing lotion) 1 x Per Week/ Discharge Instructions: Apply moisturizing lotion as directed Prim Dressing: Hydrofera Blue Ready Transfer Foam, 4x5 (in/in) 1 x Per Week/ ary Discharge Instructions: Apply to wound bed as instructed Secondary Dressing: ABD Pad, 8x10 1 x Per Week/ Discharge Instructions: Apply over primary dressing as directed. Com pression Wrap: Urgo K2 Lite, (equivalent to a 3 layer) two layer compression system, regular 1 x Per Week/ Discharge Instructions: Apply Urgo K2 Lite as directed (alternative to 3 layer compression). 1. I was not being concerned about an area of cellulitis on the right anterior upper tibia just below where a wrap would be. This is really quite tender. This could be a wrap injury. 2. She has severe stasis dermatitis but I do not think that is what this is. 3. Compression is really helped her wounds the only 1 of any sizes on the right dorsal foot. We have been using Hydrofera Blue 4. I am going to add in Keflex 500 3 times a day for 7 days. She will be seen next week. I have taken her out of her compression wrap on the right leg so they can monitor the erythema which I have marked Electronic  Signature(s) Signed: 06/14/2023 4:03:36 PM By: Rufus Sharper MD Entered By: Rufus Sharper on 06/14/2023 16:00:44 -------------------------------------------------------------------------------- SuperBill Details Patient Name: Date of Service: [name], [name] 06/14/2023 Medical Record Number: [id number] Patient Account Number: [id number] Date of Birth/Sex: Treating RN: 1928-10-18 (89 y.o. F) Primary Care Provider: Dettinger, Fonda Other Clinician: Referring Provider: Treating Provider/Extender: Rufus Sharper Levins, Fonda Duos in Treatment: 2 Diagnosis Coding ICD-10 Codes Code Description [phone] Chronic venous hypertension (idiopathic) with inflammation of bilateral lower extremity L97.828 Non-pressure chronic ulcer of other part of left lower leg with other specified severity [name], [name] ([id number]) [id number] Page 7 of 7 L97.818 Non-pressure chronic ulcer of other part of right lower leg with other specified severity L97.518 Non-pressure chronic ulcer of other part of right foot with other specified severity L03.116 Cellulitis of left lower limb Facility Procedures : CPT4 Code: [id number] Description: 99214 - WOUND CARE VISIT-LEV 4 EST PT Modifier: Quantity: 1 Physician Procedures : CPT4 Code Description Modifier [id number] - WC PHYS LEVEL 3 - EST PT ICD-10 Diagnosis Description I87.323 Chronic venous hypertension (idiopathic) with inflammation of bilateral lower extremity L03.116 Cellulitis of left lower limb L97.518  Non-pressure chronic ulcer of other part of right foot with other specified severity L97.828 Non-pressure chronic ulcer of other part of left lower leg with other specified severity Quantity: 1 Electronic Signature(s) Signed: 06/14/2023 4:27:30 PM By: Cammie Sailors RN, BSN Previous Signature: 06/14/2023 4:03:36 PM Version By: Rufus Sharper MD Entered By: Cammie Sailors on 06/14/2023 16:23:41

## 2023-06-14 NOTE — Progress Notes (Signed)
 Barretta, Vianey  B ([id number]) [id number] Page 1 of 11 Visit Report for 06/14/2023 Arrival Information Details Patient Name: Date of Service: [name] 06/14/2023 3:15 PM Medical Record Number: [id number] Patient Account Number: [id number] Date of Birth/Sex: Treating RN: 09-22-1928 (89 y.o. F) Primary Care Suzana Sohail: Dettinger, Fonda Other Clinician: Referring Desmund Elman: Treating Christphor Groft/Extender: Rufus Sharper Dettinger, Fonda Duos in Treatment: 2 Visit Information History Since Last Visit Added or deleted any medications: No Patient Arrived: Wheel Chair Any new allergies or adverse reactions: No Arrival Time: 15:33 Had a fall or experienced change in No Accompanied By: sons activities of daily living that may affect Transfer Assistance: None risk of falls: Patient Identification Verified: Yes Signs or symptoms of abuse/neglect since last visito No Secondary Verification Process Completed: Yes Hospitalized since last visit: No Patient Requires Transmission-Based Precautions: No Implantable device outside of the clinic excluding No Patient Has Alerts: Yes cellular tissue based products placed in the center Patient Alerts: Patient on Blood Thinner since last visit: Has Dressing in Place as Prescribed: Yes Has Compression in Place as Prescribed: Yes Pain Present Now: No Electronic Signature(s) Signed: 06/14/2023 4:27:30 PM By: Cammie Sailors RN, BSN Previous Signature: 06/14/2023 3:57:02 PM Version By: Wyn Iha Entered By: Cammie Sailors on 06/14/2023 16:17:29 -------------------------------------------------------------------------------- Clinic Level of Care Assessment Details Patient Name: Date of Service: [name] 06/14/2023 3:15 PM Medical Record Number: [id number] Patient Account Number: [id number] Date of Birth/Sex: Treating RN: 09/29/1928 (89 y.o. JEANELL Cammie Sailors Primary Care Amery Minasyan: Dettinger, Fonda Other  Clinician: Referring Jasmine Mcbeth: Treating Edd Reppert/Extender: Rufus Sharper Dettinger, Fonda Duos in Treatment: 2 Clinic Level of Care Assessment Items TOOL 4 Quantity Score X- 1 0 Use when only an EandM is performed on FOLLOW-UP visit ASSESSMENTS - Nursing Assessment / Reassessment X- 1 10 Reassessment of Co-morbidities (includes updates in patient status) X- 1 5 Reassessment of Adherence to Treatment Plan ASSESSMENTS - Wound and Skin A ssessment / Reassessment []  - 0 Simple Wound Assessment / Reassessment - one wound X- 4 5 Complex Wound Assessment / Reassessment - multiple wounds []  - 0 Dermatologic / Skin Assessment (not related to wound area) ASSESSMENTS - Focused Assessment X- 2 5 Circumferential Edema Measurements - multi extremities []  - 0 Nutritional Assessment / Counseling / Intervention Schofield, Chloeanne  B ([id number]) [id number] Page 2 of 11 []  - 0 Lower Extremity Assessment (monofilament, tuning fork, pulses) []  - 0 Peripheral Arterial Disease Assessment (using hand held doppler) ASSESSMENTS - Ostomy and/or Continence Assessment and Care []  - 0 Incontinence Assessment and Management []  - 0 Ostomy Care Assessment and Management (repouching, etc.) PROCESS - Coordination of Care []  - 0 Simple Patient / Family Education for ongoing care X- 1 20 Complex (extensive) Patient / Family Education for ongoing care X- 1 10 Staff obtains Chiropractor, Records, T Results / Process Orders est X- 1 10 Staff telephones HHA, Nursing Homes / Clarify orders / etc []  - 0 Routine Transfer to another Facility (non-emergent condition) []  - 0 Routine Hospital Admission (non-emergent condition) []  - 0 New Admissions / Manufacturing Engineer / Ordering NPWT Apligraf, etc. , []  - 0 Emergency Hospital Admission (emergent condition) X- 1 10 Simple Discharge Coordination []  - 0 Complex (extensive) Discharge Coordination PROCESS - Special Needs []  -  0 Pediatric / Minor Patient Management []  - 0 Isolation Patient Management []  - 0 Hearing / Language / Visual special needs []  - 0 Assessment of Community assistance (transportation, D/C planning, etc.) []  - 0 Additional assistance / Altered mentation []  -  0 Support Surface(s) Assessment (bed, cushion, seat, etc.) INTERVENTIONS - Wound Cleansing / Measurement []  - 0 Simple Wound Cleansing - one wound X- 4 5 Complex Wound Cleansing - multiple wounds []  - 0 Wound Imaging (photographs - any number of wounds) []  - 0 Wound Tracing (instead of photographs) []  - 0 Simple Wound Measurement - one wound X- 4 5 Complex Wound Measurement - multiple wounds INTERVENTIONS - Wound Dressings []  - 0 Small Wound Dressing one or multiple wounds []  - 0 Medium Wound Dressing one or multiple wounds X- 2 20 Large Wound Dressing one or multiple wounds []  - 0 Application of Medications - topical []  - 0 Application of Medications - injection INTERVENTIONS - Miscellaneous []  - 0 External ear exam []  - 0 Specimen Collection (cultures, biopsies, blood, body fluids, etc.) []  - 0 Specimen(s) / Culture(s) sent or taken to Lab for analysis []  - 0 Patient Transfer (multiple staff / Nurse, Adult / Similar devices) []  - 0 Simple Staple / Suture removal (25 or less) []  - 0 Complex Staple / Suture removal (26 or more) []  - 0 Hypo / Hyperglycemic Management (close monitor of Blood Glucose) Rittenberry, Karrin  B ([id number]) [id number] Page 3 of 11 []  - 0 Ankle / Brachial Index (ABI) - do not check if billed separately []  - 0 Vital Signs Has the patient been seen at the hospital within the last three years: Yes Total Score: 175 Level Of Care: New/Established - Level 5 Electronic Signature(s) Signed: 06/14/2023 4:27:30 PM By: Cammie Sailors RN, BSN Entered By: Cammie Sailors on 06/14/2023  16:23:28 -------------------------------------------------------------------------------- Encounter Discharge Information Details Patient Name: Date of Service: [name], LULLA DALLAS B. 06/14/2023 3:15 PM Medical Record Number: [id number] Patient Account Number: [id number] Date of Birth/Sex: Treating RN: 02-Apr-1929 (89 y.o. JEANELL Cammie Sailors Primary Care Dariann Huckaba: Dettinger, Fonda Other Clinician: Referring Kamora Vossler: Treating Visente Kirker/Extender: Rufus Sharper Dettinger, Fonda Duos in Treatment: 2 Encounter Discharge Information Items Discharge Condition: Stable Ambulatory Status: Wheelchair Discharge Destination: Home Transportation: Private Auto Accompanied By: sons Schedule Follow-up Appointment: Yes Clinical Summary of Care: Patient Declined Electronic Signature(s) Signed: 06/14/2023 4:27:30 PM By: Cammie Sailors RN, BSN Entered By: Cammie Sailors on 06/14/2023 16:25:14 -------------------------------------------------------------------------------- Lower Extremity Assessment Details Patient Name: Date of Service: [name], LULLA DALLAS FURY 06/14/2023 3:15 PM Medical Record Number: [id number] Patient Account Number: [id number] Date of Birth/Sex: Treating RN: Apr 30, 1929 (89 y.o. JEANELL Cammie Sailors Primary Care Chyrl Elwell: Dettinger, Fonda Other Clinician: Referring Makaelah Cranfield: Treating Dafne Nield/Extender: Rufus Sharper Dettinger, Fonda Duos in Treatment: 2 Edema Assessment Assessed: Colletta: No] Glenis: No] [Left: Edema] [Right: :] Calf Left: Right: Point of Measurement: 21 cm From Medial Instep 45 cm 32 cm Ankle Left: Right: Point of Measurement: 9 cm From Medial Instep 32 cm 20 cm Vascular Assessment Left: [phone].pdf Page 4 of 11Right:] Extremity colors, hair growth, and conditions: Extremity Color: [phone].pdf Page 4 of 11Normal Normal] Hair Growth on Extremity: [id number].pdf Page 4 of 11No  No] Temperature of Extremity: [phone].pdf Page 4 of 11Warm Warm] Dependent Rubor: [id number].pdf Page 4 of 11No No No No] Electronic Signature(s) Signed: 06/14/2023 4:27:30 PM By: Cammie Sailors RN, BSN Entered By: Cammie Sailors on 06/14/2023 16:17:58 -------------------------------------------------------------------------------- Multi-Disciplinary Care Plan Details Patient Name: Date of Service: [name], LULLA DALLAS FURY 06/14/2023 3:15 PM Medical Record Number: [id number] Patient Account Number: [id number] Date of Birth/Sex: Treating RN: Sep 08, 1928 (89 y.o. JEANELL Cammie Sailors Primary Care Kimmberly Wisser: Dettinger, Fonda Other Clinician: Referring Barrett Goldie: Treating Jamariya Davidoff/Extender: Rufus Sharper Dettinger, Fonda Duos in Treatment: 2 Active Inactive Venous  Leg Ulcer Nursing Diagnoses: Actual venous Insuffiency (use after diagnosis is confirmed) Knowledge deficit related to disease process and management Goals: Patient will maintain optimal edema control Date Initiated: 05/31/2023 Target Resolution Date: 07/12/2023 Goal Status: Active Patient/caregiver will verbalize understanding of disease process and disease management Date Initiated: 05/31/2023 Target Resolution Date: 07/12/2023 Goal Status: Active Verify adequate tissue perfusion prior to therapeutic compression application Date Initiated: 05/31/2023 Target Resolution Date: 07/12/2023 Goal Status: Active Interventions: Assess peripheral edema status every visit. Compression as ordered Provide education on venous insufficiency Notes: Wound/Skin Impairment Nursing Diagnoses: Impaired tissue integrity Knowledge deficit related to ulceration/compromised skin integrity Goals: Patient/caregiver will verbalize understanding of skin care regimen Date Initiated: 05/31/2023 Target Resolution Date: 07/12/2023 Goal Status: Active Ulcer/skin breakdown will have a volume reduction of  30% by week 4 Date Initiated: 05/31/2023 Target Resolution Date: 06/28/2023 Goal Status: Active Interventions: Assess patient/caregiver ability to obtain necessary supplies Assess patient/caregiver ability to perform ulcer/skin care regimen upon admission and as needed Assess ulceration(s) every visit Kalama, Merci  B ([id number]) [id number] Page 5 of 11 Provide education on ulcer and skin care Notes: Electronic Signature(s) Signed: 06/14/2023 4:27:30 PM By: Cammie Sailors RN, BSN Entered By: Cammie Sailors on 06/14/2023 16:21:38 -------------------------------------------------------------------------------- Pain Assessment Details Patient Name: Date of Service: [name], LULLA ROWAN FURY 06/14/2023 3:15 PM Medical Record Number: [id number] Patient Account Number: [id number] Date of Birth/Sex: Treating RN: [birth year] (89 y.o. JEANELL Cammie Sailors Primary Care Naila Elizondo: Dettinger, Fonda Other Clinician: Referring Redford Behrle: Treating Elissia Spiewak/Extender: Rufus Sharper Dettinger, Fonda Duos in Treatment: 2 Active Problems Location of Pain Severity and Description of Pain Patient Has Paino No Site Locations Pain Management and Medication Current Pain Management: Electronic Signature(s) Signed: 06/14/2023 4:27:30 PM By: Cammie Sailors RN, BSN Entered By: Cammie Sailors on 06/14/2023 16:17:48 -------------------------------------------------------------------------------- Patient/Caregiver Education Details Patient Name: Date of Service: [name], LULLA ROWAN FURY 1/2/2025andnbsp3:15 PM Medical Record Number: [id number] Patient Account Number: [id number] Date of Birth/Gender: Treating RN: [birth year] (89 y.o. JEANELL Cammie Sailors Primary Care Physician: Dettinger, Fonda Other Clinician: Referring Physician: Treating Physician/Extender: Rufus Sharper Maryanne Fonda Duos in Treatment: 2 Education Assessment Escoto, Amma  B ([id number])  [id number] Page 6 of 11 Education Provided To: Patient Education Topics Provided Wound/Skin Impairment: Methods: Explain/Verbal Responses: State content correctly Electronic Signature(s) Signed: 06/14/2023 4:27:30 PM By: Cammie Sailors RN, BSN Entered By: Cammie Sailors on 06/14/2023 16:21:54 -------------------------------------------------------------------------------- Wound Assessment Details Patient Name: Date of Service: [name], LULLA ROWAN FURY 06/14/2023 3:15 PM Medical Record Number: [id number] Patient Account Number: [id number] Date of Birth/Sex: Treating RN: 04/18/1929 (89 y.o. JEANELL Cammie Sailors Primary Care Dyanne Yorks: Dettinger, Fonda Other Clinician: Referring Deeandra Jerry: Treating Browning Southwood/Extender: Rufus Sharper Dettinger, Fonda Duos in Treatment: 2 Wound Status Wound Number: 1 Primary Venous Leg Ulcer Etiology: Wound Location: Left, Medial Lower Leg Wound Open Wounding Event: Gradually Appeared Status: Date Acquired: 05/17/2023 Comorbid Glaucoma, Arrhythmia, Congestive Heart Failure, Deep Vein Weeks Of Treatment: 2 History: Thrombosis, Hypertension, Osteoarthritis Clustered Wound: No Wound Measurements Length: (cm) 0.5 Width: (cm) 0.3 Depth: (cm) 0.1 Area: (cm) 0.118 Volume: (cm) 0.012 % Reduction in Area: 0% % Reduction in Volume: 0% Tunneling: No Undermining: No Wound Description Classification: Partial Thickness Exudate Amount: Medium Exudate Type: Serosanguineous Exudate Color: red, brown Foul Odor After Cleansing: No Slough/Fibrino Yes Wound Bed Granulation Amount: Large (67-100%) Granulation Quality: Red Necrotic Amount: Small (1-33%) Necrotic Quality: Adherent Slough Periwound Skin Texture Texture Color No Abnormalities Noted: No No Abnormalities Noted: No Moisture No Abnormalities Noted: No Treatment Notes Wound #1 (Lower Leg) Wound Laterality: Left,  Medial Cleanser Soap and Water Discharge Instruction: May  shower and wash wound with dial antibacterial soap and water prior to dressing change. Cullinan, Nainika  B ([id number]) [id number] Page 7 of 11 Peri-Wound Care Triamcinolone  15 (g) Discharge Instruction: Use triamcinolone  15 (g) as directed Sween Lotion (Moisturizing lotion) Discharge Instruction: Apply moisturizing lotion as directed Topical Primary Dressing Hydrofera Blue Ready Transfer Foam, 4x5 (in/in) Discharge Instruction: Apply to wound bed as instructed Secondary Dressing ABD Pad, 8x10 Discharge Instruction: Apply over primary dressing as directed. Secured With Compression Wrap Kerlix Roll 4.5x3.1 (in/yd) Discharge Instruction: Apply Kerlix and Coban compression as directed. Coban Self-Adherent Wrap 4x5 (in/yd) Discharge Instruction: Apply over Kerlix as directed. Compression Stockings Add-Ons Electronic Signature(s) Signed: 06/14/2023 4:27:30 PM By: Cammie Sailors RN, BSN Entered By: Cammie Sailors on 06/14/2023 16:18:47 -------------------------------------------------------------------------------- Wound Assessment Details Patient Name: Date of Service: [name], LULLA ROWAN FURY 06/14/2023 3:15 PM Medical Record Number: [id number] Patient Account Number: [id number] Date of Birth/Sex: Treating RN: 04-Jun-1929 (89 y.o. JEANELL Cammie Sailors Primary Care Caroleena Paolini: Dettinger, Fonda Other Clinician: Referring Hanford Lust: Treating Reed Dady/Extender: Rufus Sharper Dettinger, Fonda Duos in Treatment: 2 Wound Status Wound Number: 2 Primary Venous Leg Ulcer Etiology: Wound Location: Right, Dorsal Foot Wound Open Wounding Event: Gradually Appeared Status: Date Acquired: 03/01/2023 Comorbid Glaucoma, Arrhythmia, Congestive Heart Failure, Deep Vein Weeks Of Treatment: 2 History: Thrombosis, Hypertension, Osteoarthritis Clustered Wound: No Wound Measurements Length: (cm) 1.7 Width: (cm) 1.5 Depth: (cm) 0.1 Area: (cm) 2.003 Volume: (cm) 0.2 %  Reduction in Area: 0% % Reduction in Volume: 50.1% Tunneling: No Undermining: No Wound Description Classification: Partial Thickness Exudate Amount: Medium Exudate Type: Serosanguineous Exudate Color: red, brown Foul Odor After Cleansing: No Slough/Fibrino Yes Wound Bed Granulation Amount: Large (67-100%) Granulation Quality: Red Packard, Danitra  B ([id number]) [id number] Page 8 of 11 Granulation Quality: Red Necrotic Amount: Small (1-33%) Necrotic Quality: Adherent Slough Periwound Skin Texture Texture Color No Abnormalities Noted: No No Abnormalities Noted: No Moisture No Abnormalities Noted: No Treatment Notes Wound #2 (Foot) Wound Laterality: Dorsal, Right Cleanser Soap and Water Discharge Instruction: May shower and wash wound with dial antibacterial soap and water prior to dressing change. Peri-Wound Care Triamcinolone  15 (g) Discharge Instruction: Use triamcinolone  15 (g) as directed Sween Lotion (Moisturizing lotion) Discharge Instruction: Apply moisturizing lotion as directed Topical Primary Dressing Hydrofera Blue Ready Transfer Foam, 4x5 (in/in) Discharge Instruction: Apply to wound bed as instructed Secondary Dressing ABD Pad, 8x10 Discharge Instruction: Apply over primary dressing as directed. Secured With [employer], 4.5x3.1 (in/yd) Discharge Instruction: Secure with Kerlix as directed. Tubigrip Compression Wrap Compression Stockings Add-Ons Electronic Signature(s) Signed: 06/14/2023 4:27:30 PM By: Cammie Sailors RN, BSN Entered By: Cammie Sailors on 06/14/2023 16:19:26 -------------------------------------------------------------------------------- Wound Assessment Details Patient Name: Date of Service: [name], LULLA ROWAN FURY 06/14/2023 3:15 PM Medical Record Number: [id number] Patient Account Number: [id number] Date of Birth/Sex: Treating RN: 1928-09-12 (89 y.o. JEANELL Cammie Sailors Primary Care Romain Erion:  Dettinger, Fonda Other Clinician: Referring Matalynn Graff: Treating Rina Adney/Extender: Rufus Sharper Dettinger, Fonda Duos in Treatment: 2 Wound Status Wound Number: 3 Primary Venous Leg Ulcer Etiology: Wound Location: Right Lower Leg Wound Open Wounding Event: Gradually Appeared Status: Date Acquired: 05/24/2023 Comorbid Glaucoma, Arrhythmia, Congestive Heart Failure, Deep Vein Weeks Of Treatment: 2 History: Thrombosis, Hypertension, Osteoarthritis Clustered Wound: No Wound Measurements Daffron, Ashea  B ([id number]) Length: (cm) 1.2 Width: (cm) 1 Depth: (cm) 0.1 Area: (cm) 0.942 Volume: (cm) 0.094 [id number] Page 9 of 11 % Reduction in Area: 0% % Reduction in Volume: 0% Tunneling: No Undermining: No  Wound Description Classification: Partial Thickness Exudate Amount: Medium Exudate Type: Serosanguineous Exudate Color: red, brown Foul Odor After Cleansing: No Slough/Fibrino Yes Wound Bed Granulation Amount: Large (67-100%) Granulation Quality: Red Necrotic Amount: Small (1-33%) Necrotic Quality: Adherent Slough Periwound Skin Texture Texture Color No Abnormalities Noted: No No Abnormalities Noted: No Moisture No Abnormalities Noted: No Treatment Notes Wound #3 (Lower Leg) Wound Laterality: Right Cleanser Soap and Water Discharge Instruction: May shower and wash wound with dial antibacterial soap and water prior to dressing change. Peri-Wound Care Triamcinolone  15 (g) Discharge Instruction: Use triamcinolone  15 (g) as directed Sween Lotion (Moisturizing lotion) Discharge Instruction: Apply moisturizing lotion as directed Topical Primary Dressing Hydrofera Blue Ready Transfer Foam, 4x5 (in/in) Discharge Instruction: Apply to wound bed as instructed Secondary Dressing ABD Pad, 8x10 Discharge Instruction: Apply over primary dressing as directed. Secured With Compression Wrap Urgo K2 Lite, (equivalent to a 3 layer) two layer  compression system, regular Discharge Instruction: Apply Urgo K2 Lite as directed (alternative to 3 layer compression). Compression Stockings Add-Ons Electronic Signature(s) Signed: 06/14/2023 4:27:30 PM By: Cammie Sailors RN, BSN Entered By: Cammie Sailors on 06/14/2023 16:20:01 -------------------------------------------------------------------------------- Wound Assessment Details Patient Name: Date of Service: [name], LULLA ROWAN NOVAK. 06/14/2023 3:15 PM Medical Record Number: [id number] Patient Account Number: [id number] Barnette, Rosebud  B ([id number]) [phone].pdf Page 10 of 11 Date of Birth/Sex: Treating RN: 1929-05-25 (89 y.o. JEANELL Cammie Sailors Primary Care Delvecchio Madole: Dettinger, Fonda Other Clinician: Referring Casson Catena: Treating Samie Reasons/Extender: Rufus Sharper Dettinger, Fonda Duos in Treatment: 2 Wound Status Wound Number: 4 Primary Venous Leg Ulcer Etiology: Wound Location: Right, Proximal, Dorsal Foot Wound Open Wounding Event: Gradually Appeared Status: Date Acquired: 05/31/2023 Comorbid Glaucoma, Arrhythmia, Congestive Heart Failure, Deep Vein Weeks Of Treatment: 2 History: Thrombosis, Hypertension, Osteoarthritis Clustered Wound: No Wound Measurements Length: (cm) 0.3 Width: (cm) 4 Depth: (cm) 0.1 Area: (cm) 0.942 Volume: (cm) 0.094 % Reduction in Area: 0% % Reduction in Volume: 0% Tunneling: No Undermining: No Wound Description Classification: Full Thickness Without Exposed Suppor Exudate Amount: Medium Exudate Type: Serosanguineous Exudate Color: red, brown t Structures Wound Bed Granulation Amount: Large (67-100%) Granulation Quality: Red Necrotic Amount: Small (1-33%) Necrotic Quality: Adherent Slough Periwound Skin Texture Texture Color No Abnormalities Noted: No No Abnormalities Noted: No Moisture No Abnormalities Noted: No Treatment Notes Wound #4 (Foot) Wound Laterality: Dorsal, Right, Proximal Cleanser Soap  and Water Discharge Instruction: May shower and wash wound with dial antibacterial soap and water prior to dressing change. Peri-Wound Care Triamcinolone  15 (g) Discharge Instruction: Use triamcinolone  15 (g) as directed Sween Lotion (Moisturizing lotion) Discharge Instruction: Apply moisturizing lotion as directed Topical Primary Dressing Hydrofera Blue Ready Transfer Foam, 4x5 (in/in) Discharge Instruction: Apply to wound bed as instructed Secondary Dressing ABD Pad, 8x10 Discharge Instruction: Apply over primary dressing as directed. Secured With Compression Wrap Urgo K2 Lite, (equivalent to a 3 layer) two layer compression system, regular Discharge Instruction: Apply Urgo K2 Lite as directed (alternative to 3 layer compression). Compression Stockings Add-Ons Electronic Signature(s) Memon, Jalaila  B [id number]) [id number] Page 11 of 11 Signed: 06/14/2023 4:27:30 PM By: Cammie Sailors RN, BSN Entered By: Cammie Sailors on 06/14/2023 16:20:33

## 2023-06-15 ENCOUNTER — Telehealth: Payer: Self-pay | Admitting: Nurse Practitioner

## 2023-06-15 NOTE — Telephone Encounter (Signed)
 Received telephone call from son. He states that [facility] didn't receive FMLA papers that were faxed on 05/30/2023.  I stated that we faxed them . We didn't receive a notice that the forms didn't go thru. Re faxed to [phone] on 06/15/2023  Called Mr. [name] LMOVM that forms were refaxed.

## 2023-06-18 ENCOUNTER — Telehealth: Payer: Self-pay | Admitting: Cardiology

## 2023-06-18 NOTE — Telephone Encounter (Signed)
  Pt's son calling to follow up FMLA. He requesting to speak with [facility]

## 2023-06-18 NOTE — Telephone Encounter (Signed)
 Spoke with son. Information given for a Claim #.  Forms were re-faxed.

## 2023-06-21 ENCOUNTER — Encounter (HOSPITAL_BASED_OUTPATIENT_CLINIC_OR_DEPARTMENT_OTHER): Payer: Medicare Other | Admitting: Internal Medicine

## 2023-06-21 DIAGNOSIS — I5032 Chronic diastolic (congestive) heart failure: Secondary | ICD-10-CM | POA: Diagnosis not present

## 2023-06-21 DIAGNOSIS — L97828 Non-pressure chronic ulcer of other part of left lower leg with other specified severity: Secondary | ICD-10-CM

## 2023-06-21 DIAGNOSIS — L03116 Cellulitis of left lower limb: Secondary | ICD-10-CM | POA: Diagnosis not present

## 2023-06-21 DIAGNOSIS — I480 Paroxysmal atrial fibrillation: Secondary | ICD-10-CM | POA: Diagnosis not present

## 2023-06-21 DIAGNOSIS — I87323 Chronic venous hypertension (idiopathic) with inflammation of bilateral lower extremity: Secondary | ICD-10-CM | POA: Diagnosis not present

## 2023-06-21 DIAGNOSIS — L97818 Non-pressure chronic ulcer of other part of right lower leg with other specified severity: Secondary | ICD-10-CM

## 2023-06-21 DIAGNOSIS — Z86718 Personal history of other venous thrombosis and embolism: Secondary | ICD-10-CM | POA: Diagnosis not present

## 2023-06-21 DIAGNOSIS — Z7901 Long term (current) use of anticoagulants: Secondary | ICD-10-CM | POA: Diagnosis not present

## 2023-06-21 DIAGNOSIS — L97518 Non-pressure chronic ulcer of other part of right foot with other specified severity: Secondary | ICD-10-CM

## 2023-06-21 DIAGNOSIS — I110 Hypertensive heart disease with heart failure: Secondary | ICD-10-CM | POA: Diagnosis not present

## 2023-06-22 NOTE — Progress Notes (Addendum)
 Wyffels, Jacquetta  B ([id number]) [id number] Page 1 of 15 Visit Report for 06/21/2023 Arrival Information Details Patient Name: Date of Service: [name] 06/21/2023 1:15 PM Medical Record Number: [id number] Patient Account Number: [id number] Date of Birth/Sex: Treating RN: 01/24/1929 (89 y.o. JEANELL Cammie Sailors Primary Care Rasheeda Mulvehill: Dettinger, Fonda Other Clinician: Referring Izza Bickle: Treating Shealynn Saulnier/Extender: Rosan Raisin Dettinger, Fonda Duos in Treatment: 3 Visit Information History Since Last Visit Added or deleted any medications: No Patient Arrived: Wheel Chair Any new allergies or adverse reactions: No Arrival Time: 13:58 Had a fall or experienced change in No Accompanied By: son activities of daily living that may affect Transfer Assistance: None risk of falls: Patient Identification Verified: Yes Signs or symptoms of abuse/neglect since last visito No Secondary Verification Process Completed: Yes Hospitalized since last visit: No Patient Requires Transmission-Based Precautions: No Implantable device outside of the clinic excluding No Patient Has Alerts: Yes cellular tissue based products placed in the center Patient Alerts: Patient on Blood Thinner since last visit: Has Dressing in Place as Prescribed: Yes Has Compression in Place as Prescribed: Yes Pain Present Now: No Electronic Signature(s) Signed: 06/21/2023 5:11:58 PM By: Cammie Sailors RN, BSN Entered By: Cammie Sailors on 06/21/2023 14:00:22 -------------------------------------------------------------------------------- Clinic Level of Care Assessment Details Patient Name: Date of Service: [name] 06/21/2023 1:15 PM Medical Record Number: [id number] Patient Account Number: [id number] Date of Birth/Sex: Treating RN: 09/18/1928 (89 y.o. JEANELL Cammie Sailors Primary Care Musa Rewerts: Dettinger, Fonda Other Clinician: Referring Shawndale Kilpatrick: Treating  Donivin Wirt/Extender: Rosan Raisin Dettinger, Fonda Duos in Treatment: 3 Clinic Level of Care Assessment Items TOOL 4 Quantity Score X- 1 0 Use when only an EandM is performed on FOLLOW-UP visit ASSESSMENTS - Nursing Assessment / Reassessment X- 1 10 Reassessment of Co-morbidities (includes updates in patient status) X- 1 5 Reassessment of Adherence to Treatment Plan ASSESSMENTS - Wound and Skin A ssessment / Reassessment []  - 0 Simple Wound Assessment / Reassessment - one wound X- 3 5 Complex Wound Assessment / Reassessment - multiple wounds []  - 0 Dermatologic / Skin Assessment (not related to wound area) ASSESSMENTS - Focused Assessment X- 2 5 Circumferential Edema Measurements - multi extremities []  - 0 Nutritional Assessment / Counseling / Intervention Feld, Shundra  B ([id number]) [id number] Page 2 of 15 []  - 0 Lower Extremity Assessment (monofilament, tuning fork, pulses) []  - 0 Peripheral Arterial Disease Assessment (using hand held doppler) ASSESSMENTS - Ostomy and/or Continence Assessment and Care []  - 0 Incontinence Assessment and Management []  - 0 Ostomy Care Assessment and Management (repouching, etc.) PROCESS - Coordination of Care []  - 0 Simple Patient / Family Education for ongoing care X- 1 20 Complex (extensive) Patient / Family Education for ongoing care X- 1 10 Staff obtains Chiropractor, Records, T Results / Process Orders est X- 1 10 Staff telephones HHA, Nursing Homes / Clarify orders / etc []  - 0 Routine Transfer to another Facility (non-emergent condition) []  - 0 Routine Hospital Admission (non-emergent condition) []  - 0 New Admissions / Manufacturing Engineer / Ordering NPWT Apligraf, etc. , []  - 0 Emergency Hospital Admission (emergent condition) []  - 0 Simple Discharge Coordination []  - 0 Complex (extensive) Discharge Coordination PROCESS - Special Needs []  - 0 Pediatric / Minor Patient  Management []  - 0 Isolation Patient Management []  - 0 Hearing / Language / Visual special needs []  - 0 Assessment of Community assistance (transportation, D/C planning, etc.) []  - 0 Additional assistance / Altered mentation []  - 0 Support Surface(s) Assessment (bed,  cushion, seat, etc.) INTERVENTIONS - Wound Cleansing / Measurement []  - 0 Simple Wound Cleansing - one wound X- 3 5 Complex Wound Cleansing - multiple wounds X- 1 5 Wound Imaging (photographs - any number of wounds) []  - 0 Wound Tracing (instead of photographs) []  - 0 Simple Wound Measurement - one wound X- 3 5 Complex Wound Measurement - multiple wounds INTERVENTIONS - Wound Dressings []  - 0 Small Wound Dressing one or multiple wounds []  - 0 Medium Wound Dressing one or multiple wounds X- 2 20 Large Wound Dressing one or multiple wounds X- 1 5 Application of Medications - topical []  - 0 Application of Medications - injection INTERVENTIONS - Miscellaneous []  - 0 External ear exam []  - 0 Specimen Collection (cultures, biopsies, blood, body fluids, etc.) []  - 0 Specimen(s) / Culture(s) sent or taken to Lab for analysis []  - 0 Patient Transfer (multiple staff / Nurse, Adult / Similar devices) []  - 0 Simple Staple / Suture removal (25 or less) []  - 0 Complex Staple / Suture removal (26 or more) []  - 0 Hypo / Hyperglycemic Management (close monitor of Blood Glucose) Santiesteban, Sofiya  B ([id number]) [id number] Page 3 of 15 []  - 0 Ankle / Brachial Index (ABI) - do not check if billed separately X- 1 5 Vital Signs Has the patient been seen at the hospital within the last three years: Yes Total Score: 165 Level Of Care: New/Established - Level 5 Electronic Signature(s) Signed: 06/21/2023 5:11:58 PM By: Cammie Sailors RN, BSN Entered By: Cammie Sailors on 06/21/2023 16:56:31 -------------------------------------------------------------------------------- Encounter Discharge  Information Details Patient Name: Date of Service: [name], [name] DALL[place] B. 06/21/2023 1:15 PM Medical Record Number: [id number] Patient Account Number: [id number] Date of Birth/Sex: Treating RN: 03/02/1929 (89 y.o. JEANELL Cammie Sailors Primary Care Georgean Spainhower: Dettinger, Fonda Other Clinician: Referring Umaima Scholten: Treating Sarha Bartelt/Extender: Rosan Raisin Dettinger, Fonda Duos in Treatment: 3 Encounter Discharge Information Items Discharge Condition: Stable Ambulatory Status: Wheelchair Discharge Destination: Home Transportation: Private Auto Accompanied By: son Schedule Follow-up Appointment: Yes Clinical Summary of Care: Patient Declined Electronic Signature(s) Signed: 06/21/2023 5:11:58 PM By: Cammie Sailors RN, BSN Entered By: Cammie Sailors on 06/21/2023 16:57:38 -------------------------------------------------------------------------------- Lower Extremity Assessment Details Patient Name: Date of Service: [name], [name] 06/21/2023 1:15 PM Medical Record Number: [id number] Patient Account Number: [id number] Date of Birth/Sex: Treating RN: 04/19/1929 (89 y.o. JEANELL Cammie Sailors Primary Care Jahmai Finelli: Dettinger, Fonda Other Clinician: Referring Mairi Stagliano: Treating Brynlynn Walko/Extender: Rosan Raisin Dettinger, Fonda Duos in Treatment: 3 Edema Assessment Assessed: Colletta: No] Glenis: No] [Left: Edema] [Right: :] Calf Left: Right: Point of Measurement: 21 cm From Medial Instep 36.5 cm 35 cm Ankle Left: Right: Point of Measurement: 9 cm From Medial Instep 19.5 cm 19.8 cm Vascular Assessment Left: [id number] Page 4 of 15Right:] Pulses: Dorsalis Pedis Palpable: [id number] Page 4 of 15Yes Yes] Extremity colors, hair growth, and conditions: Extremity Color: [id number] Page 4 of 15Normal Normal] Hair Growth on Extremity: [id number] Page 4 of 15No  No] Temperature of Extremity: [id number] Page 4 of 15Warm Warm] Dependent Rubor: [id number] Page 4 of 15No No No No] Electronic Signature(s) Signed: 06/21/2023 5:11:58 PM By: Cammie Sailors RN, BSN Entered By: Cammie Sailors on 06/21/2023 14:02:35 -------------------------------------------------------------------------------- Multi Wound Chart Details Patient Name: Date of Service: [name], [name] D[place] B. 06/21/2023 1:15 PM Medical Record Number: [id number] Patient Account Number: [id number] Date of Birth/Sex: Treating RN: 01/02/1929 (89 y.o. F) Primary Care Abigail Teall: Dettinger, Fonda Other Clinician: Referring Zyaire Dumas: Treating Simisola Sandles/Extender: Rosan Raisin Dettinger, Fonda Duos in Treatment: 3  Vital Signs Height(in): 63 Pulse(bpm): 63 Weight(lbs): 135 Blood Pressure(mmHg): 112/75 Body Mass Index(BMI): 23.9 Temperature(F): 98.2 Respiratory Rate(breaths/min): 18 [1:Photos:] Left, Medial Lower Leg Right, Dorsal Foot Right Lower Leg Wound Location: Gradually Appeared Gradually Appeared Gradually Appeared Wounding Event: Venous Leg Ulcer Venous Leg Ulcer Venous Leg Ulcer Primary Etiology: Glaucoma, Arrhythmia, Congestive Glaucoma, Arrhythmia, Congestive Glaucoma, Arrhythmia, Congestive Comorbid History: Heart Failure, Deep Vein Thrombosis, Heart Failure, Deep Vein Thrombosis, Heart Failure, Deep Vein Thrombosis, Hypertension, Osteoarthritis Hypertension, Osteoarthritis Hypertension, Osteoarthritis 05/17/2023 03/01/2023 05/24/2023 Date Acquired: 3 3 3  Weeks of Treatment: Open Open Open Wound Status: No No No Wound Recurrence: 0.2x0.2x0.1 1.3x1.5x0.1 0.8x1x0.1 Measurements L x W x D (cm) 0.031 1.532 0.628 A (cm) : rea 0.003 0.153 0.063 Volume (cm) : 73.70% 23.50% 33.30% % Reduction in A rea: 75.00% 61.80% 33.00% % Reduction in Volume: Partial Thickness Partial Thickness Partial  Thickness Classification: Medium Medium Medium Exudate A mount: Serosanguineous Serosanguineous Serosanguineous Exudate Type: red, brown red, brown red, brown Exudate Color: Large (67-100%) Large (67-100%) Large (67-100%) Granulation A mount: Red Red Red Granulation Quality: Small (1-33%) Small (1-33%) Small (1-33%) Necrotic A mount: Large (67-100%) Medium (34-66%) Small (1-33%) Epithelialization: Wound Number: 4 N/A N/A Photos: N/A N/A Rayl, Tanisa  B ([id number]) [id number] Page 5 of 15 Right, Proximal, Dorsal Foot N/A N/A Wound Location: Gradually Appeared N/A N/A Wounding Event: Venous Leg Ulcer N/A N/A Primary Etiology: Glaucoma, Arrhythmia, Congestive N/A N/A Comorbid History: Heart Failure, Deep Vein Thrombosis, Hypertension, Osteoarthritis 05/31/2023 N/A N/A Date Acquired: 3 N/A N/A Weeks of Treatment: Open N/A N/A Wound Status: No N/A N/A Wound Recurrence: 0x0x0.1 N/A N/A Measurements L x W x D (cm) 0.008 N/A N/A A (cm) : rea 0.001 N/A N/A Volume (cm) : 99.20% N/A N/A % Reduction in Area: 98.90% N/A N/A % Reduction in Volume: Full Thickness Without Exposed N/A N/A Classification: Support Structures None Present N/A N/A Exudate Amount: N/A N/A N/A Exudate Type: N/A N/A N/A Exudate Color: None Present (0%) N/A N/A Granulation Amount: N/A N/A N/A Granulation Quality: None Present (0%) N/A N/A Necrotic Amount: Fascia: No N/A N/A Exposed Structures: Fat Layer (Subcutaneous Tissue): No Tendon: No Muscle: No Joint: No Bone: No Large (67-100%) N/A N/A Epithelialization: Treatment Notes Wound #1 (Lower Leg) Wound Laterality: Left, Medial Cleanser Soap and Water Discharge Instruction: May shower and wash wound with dial antibacterial soap and water prior to dressing change. Peri-Wound Care Triamcinolone  15 (g) Discharge Instruction: Use triamcinolone  15 (g) as directed Sween Lotion (Moisturizing  lotion) Discharge Instruction: Apply moisturizing lotion as directed Topical Primary Dressing Hydrofera Blue Ready Transfer Foam, 4x5 (in/in) Discharge Instruction: Apply to wound bed as instructed Secondary Dressing ABD Pad, 8x10 Discharge Instruction: Apply over primary dressing as directed. Secured With Compression Wrap Kerlix Roll 4.5x3.1 (in/yd) Discharge Instruction: Apply Kerlix and Coban compression as directed. Coban Self-Adherent Wrap 4x5 (in/yd) Discharge Instruction: Apply over Kerlix as directed. Compression Stockings Add-Ons Wound #2 (Foot) Wound Laterality: Dorsal, Right Cleanser Soap and Water Areola, Marybel  B ([id number]) [id number] Page 6 of 15 Discharge Instruction: May shower and wash wound with dial antibacterial soap and water prior to dressing change. Peri-Wound Care Triamcinolone  15 (g) Discharge Instruction: Use triamcinolone  15 (g) as directed Sween Lotion (Moisturizing lotion) Discharge Instruction: Apply moisturizing lotion as directed Topical Gentamicin  Discharge Instruction: As directed by physician Mupirocin  Ointment Discharge Instruction: Apply Mupirocin  (Bactroban ) as instructed Primary Dressing Hydrofera Blue Ready Transfer Foam, 4x5 (in/in) Discharge Instruction: Apply to wound bed as instructed Secondary Dressing ABD Pad, 8x10 Discharge Instruction: Apply  over primary dressing as directed. Secured With [employer], 4.5x3.1 (in/yd) Discharge Instruction: Secure with Kerlix as directed. Tubigrip Compression Wrap Compression Stockings Add-Ons Wound #3 (Lower Leg) Wound Laterality: Right Cleanser Soap and Water Discharge Instruction: May shower and wash wound with dial antibacterial soap and water prior to dressing change. Peri-Wound Care Triamcinolone  15 (g) Discharge Instruction: Use triamcinolone  15 (g) as directed Sween Lotion (Moisturizing lotion) Discharge Instruction: Apply moisturizing  lotion as directed Topical Gentamicin  Discharge Instruction: As directed by physician Mupirocin  Ointment Discharge Instruction: Apply Mupirocin  (Bactroban ) as instructed Primary Dressing Hydrofera Blue Ready Transfer Foam, 4x5 (in/in) Discharge Instruction: Apply to wound bed as instructed Secondary Dressing ABD Pad, 8x10 Discharge Instruction: Apply over primary dressing as directed. Secured With Compression Wrap Urgo K2 Lite, (equivalent to a 3 layer) two layer compression system, regular Discharge Instruction: Apply Urgo K2 Lite as directed (alternative to 3 layer compression). Compression Stockings Add-Ons Wound #4 (Foot) Wound Laterality: Dorsal, Right, Proximal Cleanser Soap and Water [name], [name]  B ([id number]) [id number] Page 7 of 15 Discharge Instruction: May shower and wash wound with dial antibacterial soap and water prior to dressing change. Peri-Wound Care Triamcinolone  15 (g) Discharge Instruction: Use triamcinolone  15 (g) as directed Sween Lotion (Moisturizing lotion) Discharge Instruction: Apply moisturizing lotion as directed Topical Primary Dressing Hydrofera Blue Ready Transfer Foam, 4x5 (in/in) Discharge Instruction: Apply to wound bed as instructed Secondary Dressing ABD Pad, 8x10 Discharge Instruction: Apply over primary dressing as directed. Secured With Compression Wrap Urgo K2 Lite, (equivalent to a 3 layer) two layer compression system, regular Discharge Instruction: Apply Urgo K2 Lite as directed (alternative to 3 layer compression). Compression Stockings Add-Ons Electronic Signature(s) Signed: 06/26/2023 12:04:19 PM By: Rosan Raisin DO Entered By: Rosan Raisin on 06/25/2023 10:38:51 -------------------------------------------------------------------------------- Multi-Disciplinary Care Plan Details Patient Name: Date of Service: [name], [name] [name] 06/21/2023 1:15 PM Medical Record Number:  [id number] Patient Account Number: [id number] Date of Birth/Sex: Treating RN: 02/13/1929 (89 y.o. JEANELL Cammie Sailors Primary Care Gionni Vaca: Dettinger, Fonda Other Clinician: Referring Julanne Schlueter: Treating Berneta Sconyers/Extender: Rosan Raisin Dettinger, Fonda Duos in Treatment: 3 Active Inactive Venous Leg Ulcer Nursing Diagnoses: Actual venous Insuffiency (use after diagnosis is confirmed) Knowledge deficit related to disease process and management Goals: Patient will maintain optimal edema control Date Initiated: 05/31/2023 Target Resolution Date: 07/12/2023 Goal Status: Active Patient/caregiver will verbalize understanding of disease process and disease management Date Initiated: 05/31/2023 Target Resolution Date: 07/12/2023 Goal Status: Active Verify adequate tissue perfusion prior to therapeutic compression application Date Initiated: 05/31/2023 Target Resolution Date: 07/12/2023 Goal Status: Active Interventions: Assess peripheral edema status every visit. Compression as ordered Provide education on venous insufficiency Trenkamp, Burnett  B ([id number]) [id number] Page 8 of 15 Notes: Wound/Skin Impairment Nursing Diagnoses: Impaired tissue integrity Knowledge deficit related to ulceration/compromised skin integrity Goals: Patient/caregiver will verbalize understanding of skin care regimen Date Initiated: 05/31/2023 Target Resolution Date: 07/12/2023 Goal Status: Active Ulcer/skin breakdown will have a volume reduction of 30% by week 4 Date Initiated: 05/31/2023 Target Resolution Date: 06/28/2023 Goal Status: Active Interventions: Assess patient/caregiver ability to obtain necessary supplies Assess patient/caregiver ability to perform ulcer/skin care regimen upon admission and as needed Assess ulceration(s) every visit Provide education on ulcer and skin care Notes: Electronic Signature(s) Signed: 06/21/2023 5:11:58 PM By: Cammie Sailors RN,  BSN Entered By: Cammie Sailors on 06/21/2023 16:54:41 -------------------------------------------------------------------------------- Pain Assessment Details Patient Name: Date of Service: [name], [name] B. 06/21/2023 1:15 PM Medical Record Number: [id number] Patient Account Number: [id number] Date of Birth/Sex: Treating RN: 1928-07-05 (  89 y.o. JEANELL Cammie Sailors Primary Care Nazario Russom: Dettinger, Fonda Other Clinician: Referring Tikisha Molinaro: Treating Linden Tagliaferro/Extender: Rosan Raisin Dettinger, Fonda Duos in Treatment: 3 Active Problems Location of Pain Severity and Description of Pain Patient Has Paino No Site Locations Pain Management and Medication Current Pain Management: Electronic Signature(s) Goodson, Loyda  B ([id number]) [id number] Page 9 of 15 Signed: 06/21/2023 5:11:58 PM By: Cammie Sailors RN, BSN Entered By: Cammie Sailors on 06/21/2023 14:01:26 -------------------------------------------------------------------------------- Patient/Caregiver Education Details Patient Name: Date of Service: [name], [name] [name] 1/9/2025andnbsp1:15 PM Medical Record Number: [id number] Patient Account Number: [id number] Date of Birth/Gender: Treating RN: 01/17/1929 (89 y.o. JEANELL Cammie Sailors Primary Care Physician: Dettinger, Fonda Other Clinician: Referring Physician: Treating Physician/Extender: Rosan Raisin Dettinger, Fonda Duos in Treatment: 3 Education Assessment Education Provided To: Patient Education Topics Provided Wound/Skin Impairment: Methods: Explain/Verbal Responses: State content correctly Electronic Signature(s) Signed: 06/21/2023 5:11:58 PM By: Cammie Sailors RN, BSN Entered By: Cammie Sailors on 06/21/2023 16:54:54 -------------------------------------------------------------------------------- Wound Assessment Details Patient Name: Date of Service: [name], [name] [name] 06/21/2023 1:15 PM Medical Record Number:  [id number] Patient Account Number: [id number] Date of Birth/Sex: Treating RN: 05/19/1929 (89 y.o. JEANELL Cammie Sailors Primary Care Dayshon Roback: Dettinger, Fonda Other Clinician: Referring Siddhartha Hoback: Treating Lashea Goda/Extender: Rosan Raisin Dettinger, Fonda Duos in Treatment: 3 Wound Status Wound Number: 1 Primary Venous Leg Ulcer Etiology: Wound Location: Left, Medial Lower Leg Wound Open Wounding Event: Gradually Appeared Status: Date Acquired: 05/17/2023 Comorbid Glaucoma, Arrhythmia, Congestive Heart Failure, Deep Vein Weeks Of Treatment: 3 History: Thrombosis, Hypertension, Osteoarthritis Clustered Wound: No Photos Reinecke, Jannatul  B ([id number]) [id number] Page 10 of 15 Wound Measurements Length: (cm) 0.2 Width: (cm) 0.2 Depth: (cm) 0.1 Area: (cm) 0.031 Volume: (cm) 0.003 % Reduction in Area: 73.7% % Reduction in Volume: 75% Epithelialization: Large (67-100%) Tunneling: No Undermining: No Wound Description Classification: Partial Thickness Exudate Amount: Medium Exudate Type: Serosanguineous Exudate Color: red, brown Foul Odor After Cleansing: No Slough/Fibrino Yes Wound Bed Granulation Amount: Large (67-100%) Granulation Quality: Red Necrotic Amount: Small (1-33%) Necrotic Quality: Adherent Slough Periwound Skin Texture Texture Color No Abnormalities Noted: No No Abnormalities Noted: No Moisture No Abnormalities Noted: No Treatment Notes Wound #1 (Lower Leg) Wound Laterality: Left, Medial Cleanser Soap and Water Discharge Instruction: May shower and wash wound with dial antibacterial soap and water prior to dressing change. Peri-Wound Care Triamcinolone  15 (g) Discharge Instruction: Use triamcinolone  15 (g) as directed Sween Lotion (Moisturizing lotion) Discharge Instruction: Apply moisturizing lotion as directed Topical Primary Dressing Hydrofera Blue Ready Transfer Foam, 4x5 (in/in) Discharge Instruction: Apply to wound  bed as instructed Secondary Dressing ABD Pad, 8x10 Discharge Instruction: Apply over primary dressing as directed. Secured With Compression Wrap Kerlix Roll 4.5x3.1 (in/yd) Discharge Instruction: Apply Kerlix and Coban compression as directed. Coban Self-Adherent Wrap 4x5 (in/yd) Discharge Instruction: Apply over Kerlix as directed. Compression Stockings Add-Ons Electronic Signature(s) Signed: 06/21/2023 5:11:58 PM By: Cammie Sailors RN, BSN Entered By: Cammie Sailors on 06/21/2023 14:08:36 Gilleland, Delaine  B ([id number]) [id number] Page 11 of 15 -------------------------------------------------------------------------------- Wound Assessment Details Patient Name: Date of Service: [name], [name] IRGINIA B. 06/21/2023 1:15 PM Medical Record Number: [id number] Patient Account Number: [id number] Date of Birth/Sex: Treating RN: 06/11/1929 (89 y.o. JEANELL Cammie Sailors Primary Care Mills Mitton: Dettinger, Fonda Other Clinician: Referring Caylor Tallarico: Treating Colston Pyle/Extender: Rosan Raisin Dettinger, Fonda Duos in Treatment: 3 Wound Status Wound Number: 2 Primary Venous Leg Ulcer Etiology: Wound Location: Right, Dorsal Foot Wound Open Wounding Event: Gradually Appeared Status: Date Acquired: 03/01/2023 Comorbid Glaucoma, Arrhythmia, Congestive Heart Failure, Deep Vein Weeks  Of Treatment: 3 History: Thrombosis, Hypertension, Osteoarthritis Clustered Wound: No Photos Wound Measurements Length: (cm) 1.3 Width: (cm) 1.5 Depth: (cm) 0.1 Area: (cm) 1.532 Volume: (cm) 0.153 % Reduction in Area: 23.5% % Reduction in Volume: 61.8% Epithelialization: Medium (34-66%) Tunneling: No Undermining: No Wound Description Classification: Partial Thickness Exudate Amount: Medium Exudate Type: Serosanguineous Exudate Color: red, brown Foul Odor After Cleansing: No Slough/Fibrino Yes Wound Bed Granulation Amount: Large (67-100%) Granulation Quality: Red Necrotic  Amount: Small (1-33%) Necrotic Quality: Adherent Slough Periwound Skin Texture Texture Color No Abnormalities Noted: No No Abnormalities Noted: No Moisture No Abnormalities Noted: No Treatment Notes Wound #2 (Foot) Wound Laterality: Dorsal, Right Cleanser Soap and Water Discharge Instruction: May shower and wash wound with dial antibacterial soap and water prior to dressing change. Peri-Wound Care Triamcinolone  15 (g) Discharge Instruction: Use triamcinolone  15 (g) as directed Roe, Eulogia  B ([id number]) [id number] Page 12 of 15 Sween Lotion (Moisturizing lotion) Discharge Instruction: Apply moisturizing lotion as directed Topical Gentamicin  Discharge Instruction: As directed by physician Mupirocin  Ointment Discharge Instruction: Apply Mupirocin  (Bactroban ) as instructed Primary Dressing Hydrofera Blue Ready Transfer Foam, 4x5 (in/in) Discharge Instruction: Apply to wound bed as instructed Secondary Dressing ABD Pad, 8x10 Discharge Instruction: Apply over primary dressing as directed. Secured With [employer], 4.5x3.1 (in/yd) Discharge Instruction: Secure with Kerlix as directed. Tubigrip Compression Wrap Compression Stockings Add-Ons Electronic Signature(s) Signed: 06/21/2023 5:11:58 PM By: Cammie Sailors RN, BSN Entered By: Cammie Sailors on 06/21/2023 14:09:30 -------------------------------------------------------------------------------- Wound Assessment Details Patient Name: Date of Service: [name], [name] [name] 06/21/2023 1:15 PM Medical Record Number: [id number] Patient Account Number: [id number] Date of Birth/Sex: Treating RN: 08/21/1928 (89 y.o. JEANELL Cammie Sailors Primary Care Kallyn Demarcus: Dettinger, Fonda Other Clinician: Referring Kerem Gilmer: Treating Malaijah Houchen/Extender: Rosan Raisin Dettinger, Fonda Duos in Treatment: 3 Wound Status Wound Number: 3 Primary Venous Leg Ulcer Etiology: Wound Location: Right Lower  Leg Wound Open Wounding Event: Gradually Appeared Status: Date Acquired: 05/24/2023 Comorbid Glaucoma, Arrhythmia, Congestive Heart Failure, Deep Vein Weeks Of Treatment: 3 History: Thrombosis, Hypertension, Osteoarthritis Clustered Wound: No Photos Wound Measurements Length: (cm) 0.8 Width: (cm) 1 [name], [name]  B ([id number]) Depth: (cm) 0.1 Area: (cm) 0.628 Volume: (cm) 0.063 % Reduction in Area: 33.3% % Reduction in Volume: 33% [id number] Page 13 of 15 Epithelialization: Small (1-33%) Tunneling: No Undermining: No Wound Description Classification: Partial Thickness Exudate Amount: Medium Exudate Type: Serosanguineous Exudate Color: red, brown Foul Odor After Cleansing: No Slough/Fibrino Yes Wound Bed Granulation Amount: Large (67-100%) Granulation Quality: Red Necrotic Amount: Small (1-33%) Necrotic Quality: Adherent Slough Periwound Skin Texture Texture Color No Abnormalities Noted: No No Abnormalities Noted: No Moisture No Abnormalities Noted: No Treatment Notes Wound #3 (Lower Leg) Wound Laterality: Right Cleanser Soap and Water Discharge Instruction: May shower and wash wound with dial antibacterial soap and water prior to dressing change. Peri-Wound Care Triamcinolone  15 (g) Discharge Instruction: Use triamcinolone  15 (g) as directed Sween Lotion (Moisturizing lotion) Discharge Instruction: Apply moisturizing lotion as directed Topical Gentamicin  Discharge Instruction: As directed by physician Mupirocin  Ointment Discharge Instruction: Apply Mupirocin  (Bactroban ) as instructed Primary Dressing Hydrofera Blue Ready Transfer Foam, 4x5 (in/in) Discharge Instruction: Apply to wound bed as instructed Secondary Dressing ABD Pad, 8x10 Discharge Instruction: Apply over primary dressing as directed. Secured With Compression Wrap Urgo K2 Lite, (equivalent to a 3 layer) two layer compression system, regular Discharge  Instruction: Apply Urgo K2 Lite as directed (alternative to 3 layer compression). Compression Stockings Add-Ons Electronic Signature(s) Signed: 06/21/2023 5:11:58 PM By: Cammie Sailors RN, BSN Entered By:  Cammie Sailors on 06/21/2023 14:10:24 Steffler, Keara  B ([id number]) [id number] Page 14 of 15 -------------------------------------------------------------------------------- Wound Assessment Details Patient Name: Date of Service: [name], [name] IRGIN[place] B. 06/21/2023 1:15 PM Medical Record Number: [id number] Patient Account Number: [id number] Date of Birth/Sex: Treating RN: 10/03/1928 (89 y.o. JEANELL Cammie Sailors Primary Care Allysha Tryon: Dettinger, Fonda Other Clinician: Referring Kay Shippy: Treating Jaymin Waln/Extender: Rosan Raisin Dettinger, Fonda Duos in Treatment: 3 Wound Status Wound Number: 4 Primary Venous Leg Ulcer Etiology: Wound Location: Right, Proximal, Dorsal Foot Wound Open Wounding Event: Gradually Appeared Status: Date Acquired: 05/31/2023 Comorbid Glaucoma, Arrhythmia, Congestive Heart Failure, Deep Vein Weeks Of Treatment: 3 History: Thrombosis, Hypertension, Osteoarthritis Clustered Wound: No Photos Wound Measurements Length: (cm) 0 Width: (cm) 0 Depth: (cm) 0.1 Area: (cm) 0.008 Volume: (cm) 0.001 % Reduction in Area: 99.2% % Reduction in Volume: 98.9% Epithelialization: Large (67-100%) Tunneling: No Undermining: No Wound Description Classification: Full Thickness Without Exposed Support Structures Exudate Amount: None Present Foul Odor After Cleansing: No Slough/Fibrino No Wound Bed Granulation Amount: None Present (0%) Exposed Structure Necrotic Amount: None Present (0%) Fascia Exposed: No Fat Layer (Subcutaneous Tissue) Exposed: No Tendon Exposed: No Muscle Exposed: No Joint Exposed: No Bone Exposed: No Periwound Skin Texture Texture Color No Abnormalities Noted: No No Abnormalities Noted: No Moisture No  Abnormalities Noted: No Treatment Notes Wound #4 (Foot) Wound Laterality: Dorsal, Right, Proximal Cleanser Soap and Water Discharge Instruction: May shower and wash wound with dial antibacterial soap and water prior to dressing change. Peri-Wound Care Triamcinolone  15 (g) Discharge Instruction: Use triamcinolone  15 (g) as directed Sween Lotion (Moisturizing lotion) Discharge Instruction: Apply moisturizing lotion as directed Holt, Doll  B ([id number]) [id number] Page 15 of 15 Topical Primary Dressing Hydrofera Blue Ready Transfer Foam, 4x5 (in/in) Discharge Instruction: Apply to wound bed as instructed Secondary Dressing ABD Pad, 8x10 Discharge Instruction: Apply over primary dressing as directed. Secured With Compression Wrap Urgo K2 Lite, (equivalent to a 3 layer) two layer compression system, regular Discharge Instruction: Apply Urgo K2 Lite as directed (alternative to 3 layer compression). Compression Stockings Add-Ons Electronic Signature(s) Signed: 06/21/2023 5:11:58 PM By: Cammie Sailors RN, BSN Entered By: Cammie Sailors on 06/21/2023 14:07:23 -------------------------------------------------------------------------------- Vitals Details Patient Name: Date of Service: [name], [name] [place] B. 06/21/2023 1:15 PM Medical Record Number: [id number] Patient Account Number: [id number] Date of Birth/Sex: Treating RN: 02/22/1929 (89 y.o. JEANELL Cammie Sailors Primary Care Chanel Mcadams: Dettinger, Fonda Other Clinician: Referring Andriel Omalley: Treating Derrel Moore/Extender: Rosan Raisin Dettinger, Fonda Duos in Treatment: 3 Vital Signs Time Taken: 13:50 Temperature (F): 98.2 Height (in): 63 Pulse (bpm): 63 Weight (lbs): 135 Respiratory Rate (breaths/min): 18 Body Mass Index (BMI): 23.9 Blood Pressure (mmHg): 112/75 Reference Range: 80 - 120 mg / dl Electronic Signature(s) Signed: 06/21/2023 5:11:58 PM By: Cammie Sailors RN, BSN Entered By: Cammie Sailors on 06/21/2023 14:00:56

## 2023-06-27 NOTE — Progress Notes (Signed)
 [name], Kersten  [name] ([id number]) [id number] Page 1 of 10 Visit Report for 06/21/2023 Chief Complaint Document Details Patient Name: Date of Service: [name] [name] [name] [name] 06/21/2023 1:15 PM Medical Record Number: [id number] Patient Account Number: [id number] Date of Birth/Sex: Treating RN: 02/19/1929 (89 y.o. F) Primary Care Provider: Dettinger, Fonda Other Clinician: Referring Provider: Treating Provider/Extender: Rosan Raisin Dettinger, Fonda Duos in Treatment: 3 Information Obtained from: Patient Chief Complaint 05/31/2023; patient is here for review of wounds on her bilateral lower extremities Electronic Signature(s) Signed: 06/26/2023 12:04:19 PM By: Rosan Raisin DO Entered By: Rosan Raisin on 06/25/2023 10:39:02 -------------------------------------------------------------------------------- HPI Details Patient Name: Date of Service: [name] [name] [name] [name]. 06/21/2023 1:15 PM Medical Record Number: [id number] Patient Account Number: [id number] Date of Birth/Sex: Treating RN: 04/06/1929 (89 y.o. F) Primary Care Provider: Dettinger, Fonda Other Clinician: Referring Provider: Treating Provider/Extender: Rosan Raisin Dettinger, Fonda Duos in Treatment: 3 History of Present Illness HPI Description: ADMISSION 05/31/2023 This is a 89 year old woman who lives in [place] . She is here with her son and daughter-in-law. I can see that she was in hospital in October with cellulitis of the right foot. An x-ray was negative although it did show that she had had subacute fractures of the neck of the fourth and fifth metatarsals. She was treated with IV antibiotics but discharged on amoxicillin . She was also seen I think in urgent care in September with chronic ulcers after a fall. She was felt to have cellulitis of her right lower extremity at that point. Currently she is under hospice ofo [facility] although I may not have  the correct hospice designation. She has been having some form of compression on these areas are not really clear what they are putting on the wounds. She comes into our clinic with 4 areas 1 on the left medial leg 1 on the right lateral leg and a more substantive area on the right dorsal foot. They tell me that she has been followed for a long period at [facility]. She has had venous ablations done in the past. I do not see that she has ever had formal arterial studies. Past medical history includes heart failure with preserved ejection fraction, hypertension, paroxysmal atrial fibrillation on Eliquis , iron  deficiency anemia, T12 compression fractures, hyperlipidemia, lower extremity DVT Ossey caused osteoporosis. As mentioned she is on hospice oro Congestive heart failure s, We attempted to do ABIs in the clinic areas on the right was noncompressible she could not tolerate the left. 06/14/2023; this the patient admitted to the clinic 2 weeks ago. She has severe venous hypertension and had wounds on her left medial lower leg right dorsal foot and right lateral leg. We put her in kerlix Coban out of some fear of arterial insufficiency. She had a nurse visit last week and she arrives in the clinic today with Urgo K2 lite's. Also concerning is an area on the right upper leg of erythema.o Cellulitis. We switched her to physician visit today I looked through the notes from [facility] I could not find any reference to arterial evaluation. 06/21/2023; patient presents for follow-up. At last clinic visit she was started on Keflex for concern of cellulitis to the right leg. She was taken out of compression wrap and she has been doing the dressing changes every other day With Hydrofera Blue and Tubigrip. She reports decreasing and erythema to the right leg. The left leg continue to be wrapped with antibiotic ointment and Hydrofera Blue under Kerlix/Coban. She has  tolerated this well. She  currently denies signs of infection including increased warmth, erythema or purulent lower extremities bilaterally Russaw, [name]  [name] ([id number]) [id number] Page 2 of 10 Electronic Signature(s) Signed: 06/26/2023 12:04:19 PM By: Rosan Raisin DO Entered By: Rosan Raisin on 06/25/2023 10:42:43 -------------------------------------------------------------------------------- Physical Exam Details Patient Name: Date of Service: [name] [name] [name] FURY 06/21/2023 1:15 PM Medical Record Number: [id number] Patient Account Number: [id number] Date of Birth/Sex: Treating RN: 06/07/1929 (89 y.o. F) Primary Care Provider: Dettinger, Fonda Other Clinician: Referring Provider: Treating Provider/Extender: Rosan Raisin Dettinger, Fonda Duos in Treatment: 3 Constitutional respirations regular, non-labored and within target range for patient.. Cardiovascular 2+ dorsalis pedis/posterior tibialis pulses. Psychiatric pleasant and cooperative. Notes Left lower extremity: Small open wound to the medial ankle with granulation tissue. Good edema control. Varicose veins noted. Right lower extremity: T the dorsal foot there is an open wound with granulation tissue and minimal nonviable tissue. Proximal to this there is a small area o limited to skin breakdown. T the distal right leg there is an open wound with granulation tissue. No signs surrounding signs of infection to any of the wound o beds. Decent edema control, right greater than left. Electronic Signature(s) Signed: 06/26/2023 12:04:19 PM By: Rosan Raisin DO Entered By: Rosan Raisin on 06/25/2023 10:47:49 -------------------------------------------------------------------------------- Physician Orders Details Patient Name: Date of Service: [name] [name] [name] FURY 06/21/2023 1:15 PM Medical Record Number: [id number] Patient Account Number: [id number] Date of Birth/Sex: Treating RN: 11/04/1928 (89 y.o. [name] [name] Primary Care Provider: Dettinger, Fonda Other Clinician: Referring Provider: Treating Provider/Extender: Rosan Raisin Dettinger, Fonda Duos in Treatment: 3 Verbal / Phone Orders: No Diagnosis Coding ICD-10 Coding Code Description [phone] Chronic venous hypertension (idiopathic) with inflammation of bilateral lower extremity L97.828 Non-pressure chronic ulcer of other part of left lower leg with other specified severity L97.818 Non-pressure chronic ulcer of other part of right lower leg with other specified severity L97.518 Non-pressure chronic ulcer of other part of right foot with other specified severity L03.116 Cellulitis of left lower limb Follow-up Appointments ppointment in 1 week. - Dr Rosan - already scheduled Return A Bathing/ Shower/ Hygiene May shower with protection but do not get wound dressing(s) wet. Protect dressing(s) with water repellant cover (for example, large plastic Jared, Nohealani  [name] ([id number]) [id number] Page 3 of 10 bag) or a cast cover and may then take shower. Edema Control - Orders / Instructions Bilateral Lower Extremities Elevate legs to the level of the heart or above for 30 minutes daily and/or when sitting for 3-4 times a day throughout the day. void standing for long periods of time. - Walking is ok, not standing still A If compression wraps slide down please call wound center and speak with a nurse. Home Health New wound care orders this week; continue Home Health for wound care. May utilize formulary equivalent dressing for wound treatment orders unless otherwise specified. - Wrap only left leg with kerlix/coban, Moisturizer and TCA to lower legs under the compression wrap. Hydrofera blue to all wound beds, ABD pad for draining. Right leg: hydrofera blue to wounds, cover with ABD, secure wtih kerlix, cover with tubigrip Change 3x week when not being see in wound clinic, Change 2x per week when  not being seen at wound clinic Other Home Health Orders/Instructions: - [facility] fax: [phone] Wound Treatment Wound #1 - Lower Leg Wound Laterality: Left, Medial Cleanser: Soap and Water 3 x Per Week/30 Days Discharge Instructions: May shower and wash wound with dial antibacterial soap  and water prior to dressing change. Peri-Wound Care: Triamcinolone  15 (g) 3 x Per Week/30 Days Discharge Instructions: Use triamcinolone  15 (g) as directed Peri-Wound Care: Sween Lotion (Moisturizing lotion) 3 x Per Week/30 Days Discharge Instructions: Apply moisturizing lotion as directed Prim Dressing: Hydrofera Blue Ready Transfer Foam, 4x5 (in/in) 3 x Per Week/30 Days ary Discharge Instructions: Apply to wound bed as instructed Secondary Dressing: ABD Pad, 8x10 3 x Per Week/30 Days Discharge Instructions: Apply over primary dressing as directed. Compression Wrap: Kerlix Roll 4.5x3.1 (in/yd) 3 x Per Week/30 Days Discharge Instructions: Apply Kerlix and Coban compression as directed. Compression Wrap: Coban Self-Adherent Wrap 4x5 (in/yd) 3 x Per Week/30 Days Discharge Instructions: Apply over Kerlix as directed. Wound #2 - Foot Wound Laterality: Dorsal, Right Cleanser: Soap and Water 1 x Per Week Discharge Instructions: May shower and wash wound with dial antibacterial soap and water prior to dressing change. Peri-Wound Care: Triamcinolone  15 (g) 1 x Per Week Discharge Instructions: Use triamcinolone  15 (g) as directed Peri-Wound Care: Sween Lotion (Moisturizing lotion) 1 x Per Week Discharge Instructions: Apply moisturizing lotion as directed Topical: Gentamicin  1 x Per Week Discharge Instructions: As directed by physician Topical: Mupirocin  Ointment 1 x Per Week Discharge Instructions: Apply Mupirocin  (Bactroban ) as instructed Prim Dressing: Hydrofera Blue Ready Transfer Foam, 4x5 (in/in) 1 x Per Week ary Discharge Instructions: Apply to wound bed as instructed Secondary Dressing:  ABD Pad, 8x10 1 x Per Week Discharge Instructions: Apply over primary dressing as directed. Secured With: [employer], 4.5x3.1 (in/yd) 1 x Per Week Discharge Instructions: Secure with Kerlix as directed. Secured With: Tubigrip 1 x Per Week Wound #3 - Lower Leg Wound Laterality: Right Cleanser: Soap and Water 1 x Per Week Discharge Instructions: May shower and wash wound with dial antibacterial soap and water prior to dressing change. Peri-Wound Care: Triamcinolone  15 (g) 1 x Per Week Discharge Instructions: Use triamcinolone  15 (g) as directed Peri-Wound Care: Sween Lotion (Moisturizing lotion) 1 x Per Week Discharge Instructions: Apply moisturizing lotion as directed Topical: Gentamicin  1 x Per Week Discharge Instructions: As directed by physician Lapaglia, Manha  [name] ([id number]) [id number] Page 4 of 10 Topical: Mupirocin  Ointment 1 x Per Week Discharge Instructions: Apply Mupirocin  (Bactroban ) as instructed Prim Dressing: Hydrofera Blue Ready Transfer Foam, 4x5 (in/in) 1 x Per Week ary Discharge Instructions: Apply to wound bed as instructed Secondary Dressing: ABD Pad, 8x10 1 x Per Week Discharge Instructions: Apply over primary dressing as directed. Compression Wrap: Tubigrip 1 x Per Week Wound #4 - Foot Wound Laterality: Dorsal, Right, Proximal Cleanser: Soap and Water 1 x Per Week Discharge Instructions: May shower and wash wound with dial antibacterial soap and water prior to dressing change. Peri-Wound Care: Triamcinolone  15 (g) 1 x Per Week Discharge Instructions: Use triamcinolone  15 (g) as directed Peri-Wound Care: Sween Lotion (Moisturizing lotion) 1 x Per Week Discharge Instructions: Apply moisturizing lotion as directed Prim Dressing: Hydrofera Blue Ready Transfer Foam, 4x5 (in/in) 1 x Per Week ary Discharge Instructions: Apply to wound bed as instructed Secondary Dressing: ABD Pad, 8x10 1 x Per Week Discharge Instructions: Apply  over primary dressing as directed. Compression Wrap: Tubigrip 1 x Per Week Patient Medications llergies: naproxen, Iodinated Contrast Media A Notifications Medication Indication Start End 06/21/2023 lidocaine  DOSE topical 5 % ointment - ointment topical once daily Electronic Signature(s) Signed: 06/26/2023 12:04:19 PM By: Rosan Raisin DO Previous Signature: 06/21/2023 5:11:58 PM Version By: [name] Sailors RN, BSN Entered By: Rosan Raisin on 06/25/2023 10:56:47 -------------------------------------------------------------------------------- Problem List Details  Patient Name: Date of Service: [name] RDWELL, V IRGINIA [name]. 06/21/2023 1:15 PM Medical Record Number: [id number] Patient Account Number: [id number] Date of Birth/Sex: Treating RN: 01/07/1929 (89 y.o. [name] [name] Primary Care Provider: Dettinger, Fonda Other Clinician: Referring Provider: Treating Provider/Extender: Rosan Raisin Dettinger, Fonda Duos in Treatment: 3 Active Problems ICD-10 Encounter Code Description Active Date MDM Diagnosis I87.323 Chronic venous hypertension (idiopathic) with inflammation of bilateral lower 05/31/2023 No Yes extremity L97.828 Non-pressure chronic ulcer of other part of left lower leg with other specified 05/31/2023 No Yes severity Ripp, Cailan  [name] ([id number]) [id number] Page 5 of 10 L97.818 Non-pressure chronic ulcer of other part of right lower leg with other specified 05/31/2023 No Yes severity L97.518 Non-pressure chronic ulcer of other part of right foot with other specified 05/31/2023 No Yes severity L03.116 Cellulitis of left lower limb 06/14/2023 No Yes Inactive Problems Resolved Problems Electronic Signature(s) Signed: 06/26/2023 12:04:19 PM By: Rosan Raisin DO Previous Signature: 06/21/2023 5:11:58 PM Version By: [name] Sailors RN, BSN Entered By: Rosan Raisin on 06/25/2023  10:38:37 -------------------------------------------------------------------------------- Progress Note Details Patient Name: Date of Service: [name] [name] DALLAS [name]. 06/21/2023 1:15 PM Medical Record Number: [id number] Patient Account Number: [id number] Date of Birth/Sex: Treating RN: 04/23/1929 (89 y.o. F) Primary Care Provider: Dettinger, Fonda Other Clinician: Referring Provider: Treating Provider/Extender: Rosan Raisin Dettinger, Fonda Duos in Treatment: 3 Subjective Chief Complaint Information obtained from Patient 05/31/2023; patient is here for review of wounds on her bilateral lower extremities History of Present Illness (HPI) ADMISSION 05/31/2023 This is a 89 year old woman who lives in [place] . She is here with her son and daughter-in-law. I can see that she was in hospital in October with cellulitis of the right foot. An x-ray was negative although it did show that she had had subacute fractures of the neck of the fourth and fifth metatarsals. She was treated with IV antibiotics but discharged on amoxicillin . She was also seen I think in urgent care in September with chronic ulcers after a fall. She was felt to have cellulitis of her right lower extremity at that point. Currently she is under hospice ofo [facility] although I may not have the correct hospice designation. She has been having some form of compression on these areas are not really clear what they are putting on the wounds. She comes into our clinic with 4 areas 1 on the left medial leg 1 on the right lateral leg and a more substantive area on the right dorsal foot. They tell me that she has been followed for a long period at [facility]. She has had venous ablations done in the past. I do not see that she has ever had formal arterial studies. Past medical history includes heart failure with preserved ejection fraction, hypertension, paroxysmal atrial fibrillation on Eliquis , iron   deficiency anemia, T12 compression fractures, hyperlipidemia, lower extremity DVT Ossey caused osteoporosis. As mentioned she is on hospice oro Congestive heart failure s, We attempted to do ABIs in the clinic areas on the right was noncompressible she could not tolerate the left. 06/14/2023; this the patient admitted to the clinic 2 weeks ago. She has severe venous hypertension and had wounds on her left medial lower leg right dorsal foot and right lateral leg. We put her in kerlix Coban out of some fear of arterial insufficiency. She had a nurse visit last week and she arrives in the clinic today with Urgo K2 lite's. Also concerning is an area on the right upper leg of erythema.o Cellulitis.  We switched her to physician visit today I looked through the notes from [facility] I could not find any reference to arterial evaluation. 06/21/2023; patient presents for follow-up. At last clinic visit she was started on Keflex for concern of cellulitis to the right leg. She was taken out of compression wrap and she has been doing the dressing changes every other day With Hydrofera Blue and Tubigrip. She reports decreasing and erythema to the right leg. The left leg continue to be wrapped with antibiotic ointment and Hydrofera Blue under Kerlix/Coban. She has tolerated this well. She currently denies signs of infection including increased warmth, erythema or purulent lower extremities bilaterally Patient History Drum, Shanitha  [name] ([id number]) [id number] Page 6 of 10 Information obtained from Patient, Chart. Family History Cancer - Father,Siblings,Maternal Grandparents,Child, Diabetes - Siblings, Heart Disease - Siblings,Paternal Grandparents, Hypertension - Father, Stroke - Mother, No family history of Hereditary Spherocytosis, Kidney Disease, Lung Disease, Seizures, Thyroid  Problems, Tuberculosis. Social History Never smoker, Marital Status - Widowed, Alcohol  Use - Never, Drug Use - No History, Caffeine Use - Never. Medical History Eyes Patient has history of Glaucoma Denies history of Cataracts Ear/Nose/Mouth/Throat Denies history of Chronic sinus problems/congestion, Middle ear problems Hematologic/Lymphatic Denies history of Anemia, Hemophilia, Human Immunodeficiency Virus, Lymphedema, Sickle Cell Disease Respiratory Denies history of Aspiration, Asthma, Chronic Obstructive Pulmonary Disease (COPD), Pneumothorax, Sleep Apnea, Tuberculosis Cardiovascular Patient has history of Arrhythmia - a. flutter, a. FIB, Congestive Heart Failure, Deep Vein Thrombosis - 1998, Hypertension Denies history of Angina, Coronary Artery Disease, Hypotension, Myocardial Infarction, Peripheral Arterial Disease, Peripheral Venous Disease, Phlebitis, Vasculitis Gastrointestinal Denies history of Cirrhosis , Colitis, Crohns, Hepatitis A, Hepatitis [name], Hepatitis C Genitourinary Denies history of End Stage Renal Disease Immunological Denies history of Lupus Erythematosus, Raynauds, Scleroderma Integumentary (Skin) Denies history of History of Burn Musculoskeletal Patient has history of Osteoarthritis Denies history of Gout, Rheumatoid Arthritis, Osteomyelitis Neurologic Denies history of Dementia, Neuropathy, Quadriplegia, Paraplegia, Seizure Disorder Oncologic Denies history of Received Chemotherapy, Received Radiation Hospitalization/Surgery History - right knee replacement 2016. - left knee replacement 2013. - pacemaker 2003. - breast lumpectomy. Medical A Surgical History Notes nd Cardiovascular PACEMAKER Gastrointestinal GERD IBS Diverticulosis Musculoskeletal Osteoporosis Oncologic basal cell Objective Constitutional respirations regular, non-labored and within target range for patient.. Vitals Time Taken: 1:50 PM, Height: 63 in, Weight: 135 lbs, BMI: 23.9, Temperature: 98.2 F, Pulse: 63 bpm, Respiratory Rate: 18 breaths/min, Blood  Pressure: 112/75 mmHg. Cardiovascular 2+ dorsalis pedis/posterior tibialis pulses. Psychiatric pleasant and cooperative. General Notes: Left lower extremity: Small open wound to the medial ankle with granulation tissue. Good edema control. Varicose veins noted. Right lower extremity: T the dorsal foot there is an open wound with granulation tissue and minimal nonviable tissue. Proximal to this there is a small area limited to skin o breakdown. T the distal right leg there is an open wound with granulation tissue. No signs surrounding signs of infection to any of the wound beds. Decent o edema control, right greater than left. Integumentary (Hair, Skin) Wound #1 status is Open. Original cause of wound was Gradually Appeared. The date acquired was: 05/17/2023. The wound has been in treatment 3 weeks. The wound is located on the Left,Medial Lower Leg. The wound measures 0.2cm length x 0.2cm width x 0.1cm depth; 0.031cm^2 area and 0.003cm^3 volume. There is no tunneling or undermining noted. There is a medium amount of serosanguineous drainage noted. There is large (67-100%) red granulation within the wound bed. There is a small (1-33%) amount of  necrotic tissue within the wound bed including Adherent Slough. Wound #2 status is Open. Original cause of wound was Gradually Appeared. The date acquired was: 03/01/2023. The wound has been in treatment 3 weeks. The wound is located on the Right,Dorsal Foot. The wound measures 1.3cm length x 1.5cm width x 0.1cm depth; 1.532cm^2 area and 0.153cm^3 volume. There is no tunneling or undermining noted. There is a medium amount of serosanguineous drainage noted. There is large (67-100%) red granulation within the wound Debruyne, Jadie  [name] ([id number]) [id number] Page 7 of 10 bed. There is a small (1-33%) amount of necrotic tissue within the wound bed including Adherent Slough. Wound #3 status is Open. Original cause of wound was  Gradually Appeared. The date acquired was: 05/24/2023. The wound has been in treatment 3 weeks. The wound is located on the Right Lower Leg. The wound measures 0.8cm length x 1cm width x 0.1cm depth; 0.628cm^2 area and 0.063cm^3 volume. There is no tunneling or undermining noted. There is a medium amount of serosanguineous drainage noted. There is large (67-100%) red granulation within the wound bed. There is a small (1-33%) amount of necrotic tissue within the wound bed including Adherent Slough. Wound #4 status is Open. Original cause of wound was Gradually Appeared. The date acquired was: 05/31/2023. The wound has been in treatment 3 weeks. The wound is located on the Right,Proximal,Dorsal Foot. The wound measures 0cm length x 0cm width x 0.1cm depth; 0.008cm^2 area and 0.001cm^3 volume. There is no tunneling or undermining noted. There is a none present amount of drainage noted. There is no granulation within the wound bed. There is no necrotic tissue within the wound bed. Assessment Active Problems ICD-10 Chronic venous hypertension (idiopathic) with inflammation of bilateral lower extremity Non-pressure chronic ulcer of other part of left lower leg with other specified severity Non-pressure chronic ulcer of other part of right lower leg with other specified severity Non-pressure chronic ulcer of other part of right foot with other specified severity Cellulitis of left lower limb Patient's wounds appear well-healing. She was kept out of compression wrap to the right lower extremity secondary to cellulitis. She was started on Keflex. She has been doing dressing changes with antibiotic ointment and Hydrofera Blue and using Tubigrip. I recommended doing this for 1 more week. Since she is doing well with the compression wrap on the left lower extremity I recommended continuing this along with antibiotic ointment and Hydrofera Blue Plan Follow-up Appointments: Return Appointment in 1 week. - Dr  Rosan - already scheduled Bathing/ Shower/ Hygiene: May shower with protection but do not get wound dressing(s) wet. Protect dressing(s) with water repellant cover (for example, large plastic bag) or a cast cover and may then take shower. Edema Control - Orders / Instructions: Elevate legs to the level of the heart or above for 30 minutes daily and/or when sitting for 3-4 times a day throughout the day. Avoid standing for long periods of time. - Walking is ok, not standing still If compression wraps slide down please call wound center and speak with a nurse. Home Health: New wound care orders this week; continue Home Health for wound care. May utilize formulary equivalent dressing for wound treatment orders unless otherwise specified. - Wrap only left leg with kerlix/coban, Moisturizer and TCA to lower legs under the compression wrap. Hydrofera blue to all wound beds, ABD pad for draining. Right leg: hydrofera blue to wounds, cover with ABD, secure wtih kerlix, cover with tubigrip Change 3x week when not being see in  wound clinic, Change 2x per week when not being seen at wound clinic Other Home Health Orders/Instructions: - [facility] fax: [phone] The following medication(s) was prescribed: lidocaine  topical 5 % ointment ointment topical once daily was prescribed at facility WOUND #1: - Lower Leg Wound Laterality: Left, Medial Cleanser: Soap and Water 3 x Per Week/30 Days Discharge Instructions: May shower and wash wound with dial antibacterial soap and water prior to dressing change. Peri-Wound Care: Triamcinolone  15 (g) 3 x Per Week/30 Days Discharge Instructions: Use triamcinolone  15 (g) as directed Peri-Wound Care: Sween Lotion (Moisturizing lotion) 3 x Per Week/30 Days Discharge Instructions: Apply moisturizing lotion as directed Prim Dressing: Hydrofera Blue Ready Transfer Foam, 4x5 (in/in) 3 x Per Week/30 Days ary Discharge Instructions: Apply to wound bed as  instructed Secondary Dressing: ABD Pad, 8x10 3 x Per Week/30 Days Discharge Instructions: Apply over primary dressing as directed. Com pression Wrap: Kerlix Roll 4.5x3.1 (in/yd) 3 x Per Week/30 Days Discharge Instructions: Apply Kerlix and Coban compression as directed. Com pression Wrap: Coban Self-Adherent Wrap 4x5 (in/yd) 3 x Per Week/30 Days Discharge Instructions: Apply over Kerlix as directed. WOUND #2: - Foot Wound Laterality: Dorsal, Right Cleanser: Soap and Water 1 x Per Week/ Discharge Instructions: May shower and wash wound with dial antibacterial soap and water prior to dressing change. Peri-Wound Care: Triamcinolone  15 (g) 1 x Per Week/ Discharge Instructions: Use triamcinolone  15 (g) as directed Peri-Wound Care: Sween Lotion (Moisturizing lotion) 1 x Per Week/ Discharge Instructions: Apply moisturizing lotion as directed Topical: Gentamicin  1 x Per Week/ Discharge Instructions: As directed by physician Topical: Mupirocin  Ointment 1 x Per Week/ Discharge Instructions: Apply Mupirocin  (Bactroban ) as instructed Prim Dressing: Hydrofera Blue Ready Transfer Foam, 4x5 (in/in) 1 x Per Week/ ary Discharge Instructions: Apply to wound bed as instructed Secondary Dressing: ABD Pad, 8x10 1 x Per Week/ Discharge Instructions: Apply over primary dressing as directed. Secured With: [employer], 4.5x3.1 (in/yd) 1 x Per Week/ Discharge Instructions: Secure with Kerlix as directed. Secured With: Tubigrip 1 x Per Week/ WOUND #3: - Lower Leg Wound Laterality: Right Cleanser: Soap and Water 1 x Per Week/ Robards, Louana  [name] ([id number]) [id number] Page 8 of 10 Discharge Instructions: May shower and wash wound with dial antibacterial soap and water prior to dressing change. Peri-Wound Care: Triamcinolone  15 (g) 1 x Per Week/ Discharge Instructions: Use triamcinolone  15 (g) as directed Peri-Wound Care: Sween Lotion (Moisturizing lotion) 1 x Per  Week/ Discharge Instructions: Apply moisturizing lotion as directed Topical: Gentamicin  1 x Per Week/ Discharge Instructions: As directed by physician Topical: Mupirocin  Ointment 1 x Per Week/ Discharge Instructions: Apply Mupirocin  (Bactroban ) as instructed Prim Dressing: Hydrofera Blue Ready Transfer Foam, 4x5 (in/in) 1 x Per Week/ ary Discharge Instructions: Apply to wound bed as instructed Secondary Dressing: ABD Pad, 8x10 1 x Per Week/ Discharge Instructions: Apply over primary dressing as directed. Com pression Wrap: Tubigrip 1 x Per Week/ WOUND #4: - Foot Wound Laterality: Dorsal, Right, Proximal Cleanser: Soap and Water 1 x Per Week/ Discharge Instructions: May shower and wash wound with dial antibacterial soap and water prior to dressing change. Peri-Wound Care: Triamcinolone  15 (g) 1 x Per Week/ Discharge Instructions: Use triamcinolone  15 (g) as directed Peri-Wound Care: Sween Lotion (Moisturizing lotion) 1 x Per Week/ Discharge Instructions: Apply moisturizing lotion as directed Prim Dressing: Hydrofera Blue Ready Transfer Foam, 4x5 (in/in) 1 x Per Week/ ary Discharge Instructions: Apply to wound bed as instructed Secondary Dressing: ABD Pad, 8x10 1 x  Per Week/ Discharge Instructions: Apply over primary dressing as directed. Com pression Wrap: Tubigrip 1 x Per Week/ 1. Antibiotic ointment with Hydrofera Blue to the lower extremities bilaterally. 2. Tubigrip to the right lower extremity 3. Kerlix/Coban to the left lower extremity Electronic Signature(s) Signed: 06/26/2023 12:04:19 PM By: Rosan Raisin DO Entered By: Rosan Raisin on 06/25/2023 10:56:55 -------------------------------------------------------------------------------- HxROS Details Patient Name: Date of Service: [name] [name] DALLAS [name]. 06/21/2023 1:15 PM Medical Record Number: [id number] Patient Account Number: [id number] Date of Birth/Sex: Treating RN: 05/13/1929 (89 y.o. F) Primary Care Provider:  Dettinger, Fonda Other Clinician: Referring Provider: Treating Provider/Extender: Rosan Raisin Dettinger, Fonda Duos in Treatment: 3 Information Obtained From Patient Chart Eyes Medical History: Positive for: Glaucoma Negative for: Cataracts Ear/Nose/Mouth/Throat Medical History: Negative for: Chronic sinus problems/congestion; Middle ear problems Hematologic/Lymphatic Medical History: Negative for: Anemia; Hemophilia; Human Immunodeficiency Virus; Lymphedema; Sickle Cell Disease Respiratory Medical History: Negative for: Aspiration; Asthma; Chronic Obstructive Pulmonary Disease (COPD); Pneumothorax; Sleep Apnea; Tuberculosis Cardiovascular Rapozo, Erdine  [name] ([id number]) [id number] Page 9 of 10 Medical History: Positive for: Arrhythmia - a. flutter, a. FIB; Congestive Heart Failure; Deep Vein Thrombosis - 1998; Hypertension Negative for: Angina; Coronary Artery Disease; Hypotension; Myocardial Infarction; Peripheral Arterial Disease; Peripheral Venous Disease; Phlebitis; Vasculitis Past Medical History Notes: PACEMAKER Gastrointestinal Medical History: Negative for: Cirrhosis ; Colitis; Crohns; Hepatitis A; Hepatitis [name]; Hepatitis C Past Medical History Notes: GERD IBS Diverticulosis Genitourinary Medical History: Negative for: End Stage Renal Disease Immunological Medical History: Negative for: Lupus Erythematosus; Raynauds; Scleroderma Integumentary (Skin) Medical History: Negative for: History of Burn Musculoskeletal Medical History: Positive for: Osteoarthritis Negative for: Gout; Rheumatoid Arthritis; Osteomyelitis Past Medical History Notes: Osteoporosis Neurologic Medical History: Negative for: Dementia; Neuropathy; Quadriplegia; Paraplegia; Seizure Disorder Oncologic Medical History: Negative for: Received Chemotherapy; Received Radiation Past Medical History Notes: basal cell HBO Extended History  Items Eyes: Glaucoma Immunizations Pneumococcal Vaccine: Received Pneumococcal Vaccination: Yes Received Pneumococcal Vaccination On or After 60th Birthday: Yes Implantable Devices No devices added Hospitalization / Surgery History Type of Hospitalization/Surgery right knee replacement 2016 left knee replacement 2013 pacemaker 2003 breast lumpectomy Family and Social History Cancer: Yes - Father,Siblings,Maternal Grandparents,Child; Diabetes: Yes - Siblings; Heart Disease: Yes - Siblings,Paternal Grandparents; Hereditary Spherocytosis: No; Hypertension: Yes - Father; Kidney Disease: No; Lung Disease: No; Seizures: No; Stroke: Yes - Mother; Thyroid  Problems: No; Tuberculosis: No; Never smoker; Marital Status - Widowed; Alcohol Use: Never; Drug Use: No History; Caffeine Use: Never Social Determinants of Health (SDOH) 1. In the past 2 months, did you or others you live with eat smaller meals or skip meals because you didn't have money for foodo : No 2. Are you homeless or worried that you might be in the futureo : No Bento, Reina  [name] ([id number]) [id number] Page 10 of 10 3. Do you have trouble paying for your utilities (gas, electricity, phone)o : No 4. Do you have trouble finding or paying for a rideo : No 5. Do you need daycare, or better daycare, for your kidso : No 6. Are you unemployed or without regular incomeo : No 7. Do you need help finding a better jobo : No 8. Do you need help getting more educationo : No 9. Are you concerned about someone in your home using drugs or alcoholo : No 10. Do you feel unsafe in your daily lifeo : No 11. Is anyone in your home threatening or abusing youo : No 12. Do you lack quality relationships that make you feel valued and supportedo : No 13. Do  you need help getting cultural information in a language you understando : No 14. Do you need help getting internet accesso : No Advanced Directives and  Instructions Spiritual or Cultural beliefs preclude asking about Advance Care Planning: No Advanced Directives: Yes; cone has a copy. Copy Provided: No Do not resuscitate: No Living Will: No Medical Power of Attorney: No Surrogate Decision Maker: No Electronic Signature(s) Signed: 06/26/2023 12:04:19 PM By: Rosan Raisin DO Entered By: Rosan Raisin on 06/25/2023 10:42:48 -------------------------------------------------------------------------------- SuperBill Details Patient Name: Date of Service: [name] [name] [name] FURY 06/21/2023 Medical Record Number: [id number] Patient Account Number: [id number] Date of Birth/Sex: Treating RN: 11/27/1928 (89 y.o. [name] [name] Primary Care Provider: Dettinger, Fonda Other Clinician: Referring Provider: Treating Provider/Extender: Rosan Raisin Dettinger, Fonda Duos in Treatment: 3 Diagnosis Coding ICD-10 Codes Code Description [phone] Chronic venous hypertension (idiopathic) with inflammation of bilateral lower extremity L97.828 Non-pressure chronic ulcer of other part of left lower leg with other specified severity L97.818 Non-pressure chronic ulcer of other part of right lower leg with other specified severity L97.518 Non-pressure chronic ulcer of other part of right foot with other specified severity L03.116 Cellulitis of left lower limb Facility Procedures : CPT4 Code: [id number] Description: 00784 - WOUND CARE VISIT-LEV 5 EST PT Modifier: Quantity: 1 Physician Procedures : CPT4 Code Description Modifier [id number] - WC PHYS LEVEL 3 - EST PT ICD-10 Diagnosis Description I87.323 Chronic venous hypertension (idiopathic) with inflammation of bilateral lower extremity L97.828 Non-pressure chronic ulcer of other part of  left lower leg with other specified severity L97.818 Non-pressure chronic ulcer of other part of right lower leg with other specified severity L97.518 Non-pressure chronic ulcer of other part of right foot with  other specified severity Quantity: 1 Electronic Signature(s) Signed: 06/26/2023 12:04:19 PM By: Rosan Raisin DO Previous Signature: 06/21/2023 5:11:58 PM Version By: [name] Sailors RN, BSN Entered By: Rosan Raisin on 06/25/2023 10:56:59

## 2023-06-28 ENCOUNTER — Encounter (HOSPITAL_BASED_OUTPATIENT_CLINIC_OR_DEPARTMENT_OTHER): Payer: Medicare Other | Admitting: Internal Medicine

## 2023-06-28 DIAGNOSIS — L97828 Non-pressure chronic ulcer of other part of left lower leg with other specified severity: Secondary | ICD-10-CM

## 2023-06-28 DIAGNOSIS — L97518 Non-pressure chronic ulcer of other part of right foot with other specified severity: Secondary | ICD-10-CM

## 2023-06-28 DIAGNOSIS — I480 Paroxysmal atrial fibrillation: Secondary | ICD-10-CM | POA: Diagnosis not present

## 2023-06-28 DIAGNOSIS — L97818 Non-pressure chronic ulcer of other part of right lower leg with other specified severity: Secondary | ICD-10-CM

## 2023-06-28 DIAGNOSIS — Z86718 Personal history of other venous thrombosis and embolism: Secondary | ICD-10-CM | POA: Diagnosis not present

## 2023-06-28 DIAGNOSIS — L03116 Cellulitis of left lower limb: Secondary | ICD-10-CM | POA: Diagnosis not present

## 2023-06-28 DIAGNOSIS — I110 Hypertensive heart disease with heart failure: Secondary | ICD-10-CM | POA: Diagnosis not present

## 2023-06-28 DIAGNOSIS — I87323 Chronic venous hypertension (idiopathic) with inflammation of bilateral lower extremity: Secondary | ICD-10-CM | POA: Diagnosis not present

## 2023-06-28 DIAGNOSIS — I5032 Chronic diastolic (congestive) heart failure: Secondary | ICD-10-CM | POA: Diagnosis not present

## 2023-06-28 DIAGNOSIS — Z7901 Long term (current) use of anticoagulants: Secondary | ICD-10-CM | POA: Diagnosis not present

## 2023-06-28 NOTE — Progress Notes (Addendum)
MAKINNLEY, CHOUDHURY [name] ([id number]) [id number] Page 1 of 9 Visit Report for 06/28/2023 Chief Complaint Document Details Patient Name: Date of Service: [name] [name] 06/28/2023 12:30 PM Medical Record Number: [id number] Patient Account Number: [id number] Date of Birth/Sex: Treating RN: 09-30-1928 (89 y.o. F) Primary Care Provider: Dettinger, Ivin Booty Other Clinician: Referring Provider: Treating Provider/Extender: Geralyn Corwin Dettinger, Larence Penning in Treatment: 4 Information Obtained from: Patient Chief Complaint 05/31/2023; patient is here for review of wounds on her bilateral lower extremities Electronic Signature(s) Signed: 06/28/2023 4:08:41 PM By: Geralyn Corwin DO Entered By: Geralyn Corwin on 06/28/2023 13:19:58 -------------------------------------------------------------------------------- HPI Details Patient Name: Date of Service: [name] [name], [name] [name]. 06/28/2023 12:30 PM Medical Record Number: [id number] Patient Account Number: [id number] Date of Birth/Sex: Treating RN: 02-Nov-1928 (89 y.o. F) Primary Care Provider: Dettinger, Ivin Booty Other Clinician: Referring Provider: Treating Provider/Extender: Geralyn Corwin Dettinger, Larence Penning in Treatment: 4 History of Present Illness HPI Description: ADMISSION 05/31/2023 This is a 89 year old woman who lives in [place]. She is here with her son and daughter-in-law. I can see that she was in hospital in October with cellulitis of the right foot. An x-ray was negative although it did show that she had had subacute fractures of the neck of the fourth and fifth metatarsals. She was treated with IV antibiotics but discharged on amoxicillin. She was also seen I think in urgent care in September with chronic ulcers after a fall. She was felt to have cellulitis of her right lower extremity at that point. Currently she is under hospice ofo [facility] although I may not have  the correct hospice designation. She has been having some form of compression on these areas are not really clear what they are putting on the wounds. She comes into our clinic with 4 areas 1 on the left medial leg 1 on the right lateral leg and a more substantive area on the right dorsal foot. They tell me that she has been followed for a long period at [facility]. She has had venous ablations done in the past. I do not see that she has ever had formal arterial studies. Past medical history includes heart failure with preserved ejection fraction, hypertension, paroxysmal atrial fibrillation on Eliquis, iron deficiency anemia, T12 compression fractures, hyperlipidemia, lower extremity DVT Ossey caused osteoporosis. As mentioned she is on hospice oro Congestive heart failure s, We attempted to do ABIs in the clinic areas on the right was noncompressible she could not tolerate the left. 06/14/2023; this the patient admitted to the clinic 2 weeks ago. She has severe venous hypertension and had wounds on her left medial lower leg right dorsal foot and right lateral leg. We put her in kerlix Coban out of some fear of arterial insufficiency. She had a nurse visit last week and she arrives in the clinic today with Urgo K2 lite's. Also concerning is an area on the right upper leg of erythema.o Cellulitis. We switched her to physician visit today I looked through the notes from [facility] I could not find any reference to arterial evaluation. 06/21/2023; patient presents for follow-up. At last clinic visit she was started on Keflex for concern of cellulitis to the right leg. She was taken out of compression wrap and she has been doing the dressing changes every other day With Hydrofera Blue and Tubigrip. She reports decreasing and erythema to the right leg. The left leg continue to be wrapped with antibiotic ointment and Hydrofera Blue under Kerlix/Coban. She has  tolerated this well. She  currently denies signs of infection including increased warmth, erythema or purulent lower extremities bilaterally 06/28/2023; patient presents for follow-up. We have been using Hydrofera Blue under Kerlix/Coban to the left lower extremity. Her wounds of healed here. She continues to have a wound on the right dorsal foot and she has been using antibiotic ointment with Hydrofera Blue under Tubigrip. This is smaller. No signs of [name] [name] ([id number]) [phone].pdf Page 2 of 9 infection. Electronic Signature(s) Signed: 06/28/2023 4:08:41 PM By: Geralyn Corwin DO Entered By: Geralyn Corwin on 06/28/2023 13:21:16 -------------------------------------------------------------------------------- Physical Exam Details Patient Name: Date of Service: [name] [name] [name] 06/28/2023 12:30 PM Medical Record Number: [id number] Patient Account Number: [id number] Date of Birth/Sex: Treating RN: [birth year] (89 y.o. F) Primary Care Provider: Dettinger, Ivin Booty Other Clinician: Referring Provider: Treating Provider/Extender: Geralyn Corwin Dettinger, Larence Penning in Treatment: 4 Constitutional respirations regular, non-labored and within target range for patient.. Cardiovascular 2+ dorsalis pedis/posterior tibialis pulses. Psychiatric pleasant and cooperative. Notes Left lower extremity: Epithelization to the previous wound site. Varicose veins noted. 2+ pitting edema to the knee Right lower extremity: T the dorsal foot there is an open wound with granulation tissue and scant nonviable tissue. Previous wound sites distal and proximal to o this area have epithelialized. Electronic Signature(s) Signed: 06/28/2023 4:08:41 PM By: Geralyn Corwin DO Entered By: Geralyn Corwin on 06/28/2023 13:32:53 -------------------------------------------------------------------------------- Physician Orders Details Patient Name: Date of Service: [name] [name], [name] [name].  06/28/2023 12:30 PM Medical Record Number: [id number] Patient Account Number: [id number] Date of Birth/Sex: Treating RN: [birth year] (89 y.o. Orville Govern Primary Care Provider: Dettinger, Ivin Booty Other Clinician: Referring Provider: Treating Provider/Extender: Geralyn Corwin Dettinger, Larence Penning in Treatment: 4 Verbal / Phone Orders: No Diagnosis Coding Follow-up Appointments ppointment in 1 week. - Dr Mikey Bussing - 07/05/23 @ 1230 Return A Bathing/ Shower/ Hygiene May shower with protection but do not get wound dressing(s) wet. Protect dressing(s) with water repellant cover (for example, large plastic bag) or a cast cover and may then take shower. Edema Control - Orders / Instructions Bilateral Lower Extremities Elevate legs to the level of the heart or above for 30 minutes daily and/or when sitting for 3-4 times a day throughout the day. void standing for long periods of time. - Walking is ok, not standing still A If compression wraps slide down please call wound center and speak with a nurse. Home Health Cecylia, Panlilio Lathrop [name] ([id number]) [id number] Page 3 of 9 New wound care orders this week; continue Home Health for wound care. May utilize formulary equivalent dressing for wound treatment orders unless otherwise specified. - Hydrofera blue to wound beds, ABD pad for draining. Right leg: hydrofera blue to wounds, cover with ABD, secure wtih kerlix, cover with tubigrip Change 3x week when not being see in wound clinic, Change 2x per week when not being seen at wound clinic Other Home Health Orders/Instructions: - [facility] fax: [phone] Wound Treatment Wound #2 - Foot Wound Laterality: Dorsal, Right Cleanser: Soap and Water 1 x Per Week Discharge Instructions: May shower and wash wound with dial antibacterial soap and water prior to dressing change. Peri-Wound Care: Triamcinolone 15 (g) 1 x Per Week Discharge Instructions: Use  triamcinolone 15 (g) as directed Peri-Wound Care: Sween Lotion (Moisturizing lotion) 1 x Per Week Discharge Instructions: Apply moisturizing lotion as directed Topical: Gentamicin 1 x Per Week Discharge Instructions: As directed by physician Topical: Mupirocin Ointment 1 x Per Week Discharge Instructions: Apply Mupirocin (Bactroban) as  instructed Prim Dressing: Hydrofera Blue Ready Transfer Foam, 4x5 (in/in) 1 x Per Week ary Discharge Instructions: Apply to wound bed as instructed Secondary Dressing: ABD Pad, 8x10 1 x Per Week Discharge Instructions: Apply over primary dressing as directed. Secured With: [employer], 4.5x3.1 (in/yd) 1 x Per Week Discharge Instructions: Secure with Kerlix as directed. Secured With: Tubigrip 1 x Per Week Patient Medications llergies: naproxen, Iodinated Contrast Media A Notifications Medication Indication Start End 06/28/2023 lidocaine DOSE topical 4 % cream - cream topical once daily Electronic Signature(s) Signed: 06/28/2023 4:08:41 PM By: Geralyn Corwin DO Entered By: Geralyn Corwin on 06/28/2023 13:33:01 -------------------------------------------------------------------------------- Problem List Details Patient Name: Date of Service: [name] [name], [name] [name]. 06/28/2023 12:30 PM Medical Record Number: [id number] Patient Account Number: [id number] Date of Birth/Sex: Treating RN: 03/13/1929 (89 y.o. F) Primary Care Provider: Dettinger, Ivin Booty Other Clinician: Referring Provider: Treating Provider/Extender: Geralyn Corwin Dettinger, Larence Penning in Treatment: 4 Active Problems ICD-10 Encounter Code Description Active Date MDM Diagnosis I87.323 Chronic venous hypertension (idiopathic) with inflammation of bilateral lower 05/31/2023 No Yes extremity Fleischer, Maygen [name] ([id number]) [phone].pdf Page 4 of 9 [phone] Non-pressure chronic ulcer of other part of left lower leg with other specified 05/31/2023 No  Yes severity L97.818 Non-pressure chronic ulcer of other part of right lower leg with other specified 05/31/2023 No Yes severity L97.518 Non-pressure chronic ulcer of other part of right foot with other specified 05/31/2023 No Yes severity L03.116 Cellulitis of left lower limb 06/14/2023 No Yes Inactive Problems Resolved Problems Electronic Signature(s) Signed: 06/28/2023 4:08:41 PM By: Geralyn Corwin DO Entered By: Geralyn Corwin on 06/28/2023 13:18:58 -------------------------------------------------------------------------------- Progress Note Details Patient Name: Date of Service: [name] [name], [name] [name]. 06/28/2023 12:30 PM Medical Record Number: [id number] Patient Account Number: [id number] Date of Birth/Sex: Treating RN: Oct 21, 1928 (89 y.o. F) Primary Care Provider: Dettinger, Ivin Booty Other Clinician: Referring Provider: Treating Provider/Extender: Geralyn Corwin Dettinger, Larence Penning in Treatment: 4 Subjective Chief Complaint Information obtained from Patient 05/31/2023; patient is here for review of wounds on her bilateral lower extremities History of Present Illness (HPI) ADMISSION 05/31/2023 This is a 89 year old woman who lives in [place]. She is here with her son and daughter-in-law. I can see that she was in hospital in October with cellulitis of the right foot. An x-ray was negative although it did show that she had had subacute fractures of the neck of the fourth and fifth metatarsals. She was treated with IV antibiotics but discharged on amoxicillin. She was also seen I think in urgent care in September with chronic ulcers after a fall. She was felt to have cellulitis of her right lower extremity at that point. Currently she is under hospice ofo [facility] although I may not have the correct hospice designation. She has been having some form of compression on these areas are not really clear what they are putting on the wounds. She comes  into our clinic with 4 areas 1 on the left medial leg 1 on the right lateral leg and a more substantive area on the right dorsal foot. They tell me that she has been followed for a long period at [facility]. She has had venous ablations done in the past. I do not see that she has ever had formal arterial studies. Past medical history includes heart failure with preserved ejection fraction, hypertension, paroxysmal atrial fibrillation on Eliquis, iron deficiency anemia, T12 compression fractures, hyperlipidemia, lower extremity DVT Ossey caused osteoporosis. As mentioned she is on hospice oro Congestive heart  failure s, We attempted to do ABIs in the clinic areas on the right was noncompressible she could not tolerate the left. 06/14/2023; this the patient admitted to the clinic 2 weeks ago. She has severe venous hypertension and had wounds on her left medial lower leg right dorsal foot and right lateral leg. We put her in kerlix Coban out of some fear of arterial insufficiency. She had a nurse visit last week and she arrives in the clinic today with Urgo K2 lite's. Also concerning is an area on the right upper leg of erythema.o Cellulitis. We switched her to physician visit today I looked through the notes from [facility] I could not find any reference to arterial evaluation. 06/21/2023; patient presents for follow-up. At last clinic visit she was started on Keflex for concern of cellulitis to the right leg. She was taken out of compression wrap and she has been doing the dressing changes every other day With Hydrofera Blue and Tubigrip. She reports decreasing and erythema to the right leg. The left leg continue to be wrapped with antibiotic ointment and Hydrofera Blue under Kerlix/Coban. She has tolerated this well. She currently denies signs of infection including increased warmth, erythema or purulent lower extremities bilaterally [name] [name], [name] [name] ([id number])  [phone].pdf Page 5 of 9 06/28/2023; patient presents for follow-up. We have been using Hydrofera Blue under Kerlix/Coban to the left lower extremity. Her wounds of healed here. She continues to have a wound on the right dorsal foot and she has been using antibiotic ointment with Hydrofera Blue under Tubigrip. This is smaller. No signs of infection. Patient History Information obtained from Patient, Chart. Family History Cancer - Father,Siblings,Maternal Grandparents,Child, Diabetes - Siblings, Heart Disease - Siblings,Paternal Grandparents, Hypertension - Father, Stroke - Mother, No family history of Hereditary Spherocytosis, Kidney Disease, Lung Disease, Seizures, Thyroid Problems, Tuberculosis. Social History Never smoker, Marital Status - Widowed, Alcohol Use - Never, Drug Use - No History, Caffeine Use - Never. Medical History Eyes Patient has history of Glaucoma Denies history of Cataracts Ear/Nose/Mouth/Throat Denies history of Chronic sinus problems/congestion, Middle ear problems Hematologic/Lymphatic Denies history of Anemia, Hemophilia, Human Immunodeficiency Virus, Lymphedema, Sickle Cell Disease Respiratory Denies history of Aspiration, Asthma, Chronic Obstructive Pulmonary Disease (COPD), Pneumothorax, Sleep Apnea, Tuberculosis Cardiovascular Patient has history of Arrhythmia - a. flutter, a. FIB, Congestive Heart Failure, Deep Vein Thrombosis - 1998, Hypertension Denies history of Angina, Coronary Artery Disease, Hypotension, Myocardial Infarction, Peripheral Arterial Disease, Peripheral Venous Disease, Phlebitis, Vasculitis Gastrointestinal Denies history of Cirrhosis , Colitis, Crohns, Hepatitis A, Hepatitis [name], Hepatitis C Genitourinary Denies history of End Stage Renal Disease Immunological Denies history of Lupus Erythematosus, Raynauds, Scleroderma Integumentary (Skin) Denies history of History of Burn Musculoskeletal Patient has  history of Osteoarthritis Denies history of Gout, Rheumatoid Arthritis, Osteomyelitis Neurologic Denies history of Dementia, Neuropathy, Quadriplegia, Paraplegia, Seizure Disorder Oncologic Denies history of Received Chemotherapy, Received Radiation Hospitalization/Surgery History - right knee replacement 2016. - left knee replacement 2013. - pacemaker 2003. - breast lumpectomy. Medical A Surgical History Notes nd Cardiovascular PACEMAKER Gastrointestinal GERD IBS Diverticulosis Musculoskeletal Osteoporosis Oncologic basal cell Objective Constitutional respirations regular, non-labored and within target range for patient.. Vitals Time Taken: 12:49 PM, Height: 63 in, Weight: 135 lbs, BMI: 23.9, Temperature: 97.9 F, Pulse: 60 bpm, Respiratory Rate: 18 breaths/min, Blood Pressure: 94/56 mmHg. Cardiovascular 2+ dorsalis pedis/posterior tibialis pulses. Psychiatric pleasant and cooperative. General Notes: Left lower extremity: Epithelization to the previous wound site. Varicose veins noted. 2+ pitting edema to the knee Right  lower extremity: T the o dorsal foot there is an open wound with granulation tissue and scant nonviable tissue. Previous wound sites distal and proximal to this area have epithelialized. Integumentary (Hair, Skin) Wound #1 status is Healed - Epithelialized. Original cause of wound was Gradually Appeared. The date acquired was: 05/17/2023. The wound has been in treatment 4 weeks. The wound is located on the Left,Medial Lower Leg. The wound measures 0cm length x 0cm width x 0cm depth; 0cm^2 area and 0cm^3 [name] [name], [name] [name] ([id number]) [phone].pdf Page 6 of 9 volume. There is no tunneling or undermining noted. There is a none present amount of drainage noted. There is no granulation within the wound bed. There is no necrotic tissue within the wound bed. Wound #2 status is Open. Original cause of wound was Gradually Appeared. The date  acquired was: 03/01/2023. The wound has been in treatment 4 weeks. The wound is located on the Right,Dorsal Foot. The wound measures 1cm length x 1.3cm width x 0.1cm depth; 1.021cm^2 area and 0.102cm^3 volume. There is no tunneling or undermining noted. There is a medium amount of serosanguineous drainage noted. There is large (67-100%) red granulation within the wound bed. There is a small (1-33%) amount of necrotic tissue within the wound bed including Adherent Slough. Wound #3 status is Healed - Epithelialized. Original cause of wound was Gradually Appeared. The date acquired was: 05/24/2023. The wound has been in treatment 4 weeks. The wound is located on the Right Lower Leg. The wound measures 0cm length x 0cm width x 0cm depth; 0cm^2 area and 0cm^3 volume. There is no tunneling or undermining noted. There is a none present amount of drainage noted. There is no granulation within the wound bed. There is no necrotic tissue within the wound bed. Wound #4 status is Healed - Epithelialized. Original cause of wound was Gradually Appeared. The date acquired was: 05/31/2023. The wound has been in treatment 4 weeks. The wound is located on the Right,Proximal,Dorsal Foot. The wound measures 0cm length x 0cm width x 0cm depth; 0cm^2 area and 0cm^3 volume. There is no tunneling or undermining noted. There is a none present amount of drainage noted. There is no granulation within the wound bed. There is no necrotic tissue within the wound bed. Assessment Active Problems ICD-10 Chronic venous hypertension (idiopathic) with inflammation of bilateral lower extremity Non-pressure chronic ulcer of other part of left lower leg with other specified severity Non-pressure chronic ulcer of other part of right lower leg with other specified severity Non-pressure chronic ulcer of other part of right foot with other specified severity Cellulitis of left lower limb Patient's left lower extremity wounds have healed. I  recommended using compression stockings here. She has some at home. We also gave her Tubigrip in office. The right lower extremity swelling is well-controlled with Tubigrip. I recommended continuing dressing changes with Hydrofera Blue. No signs of cellulitis to this leg. If needed we can restart in office wraps but for now wound appears well-healing. Plan Follow-up Appointments: Return Appointment in 1 week. - Dr Mikey Bussing - 07/05/23 @ 1230 Bathing/ Shower/ Hygiene: May shower with protection but do not get wound dressing(s) wet. Protect dressing(s) with water repellant cover (for example, large plastic bag) or a cast cover and may then take shower. Edema Control - Orders / Instructions: Elevate legs to the level of the heart or above for 30 minutes daily and/or when sitting for 3-4 times a day throughout the day. Avoid standing for long periods of time. -  Walking is ok, not standing still If compression wraps slide down please call wound center and speak with a nurse. Home Health: New wound care orders this week; continue Home Health for wound care. May utilize formulary equivalent dressing for wound treatment orders unless otherwise specified. - Hydrofera blue to wound beds, ABD pad for draining. Right leg: hydrofera blue to wounds, cover with ABD, secure wtih kerlix, cover with tubigrip Change 3x week when not being see in wound clinic, Change 2x per week when not being seen at wound clinic Other Home Health Orders/Instructions: - [facility] fax: [phone] The following medication(s) was prescribed: lidocaine topical 4 % cream cream topical once daily was prescribed at facility WOUND #2: - Foot Wound Laterality: Dorsal, Right Cleanser: Soap and Water 1 x Per Week/ Discharge Instructions: May shower and wash wound with dial antibacterial soap and water prior to dressing change. Peri-Wound Care: Triamcinolone 15 (g) 1 x Per Week/ Discharge Instructions: Use triamcinolone 15 (g) as  directed Peri-Wound Care: Sween Lotion (Moisturizing lotion) 1 x Per Week/ Discharge Instructions: Apply moisturizing lotion as directed Topical: Gentamicin 1 x Per Week/ Discharge Instructions: As directed by physician Topical: Mupirocin Ointment 1 x Per Week/ Discharge Instructions: Apply Mupirocin (Bactroban) as instructed Prim Dressing: Hydrofera Blue Ready Transfer Foam, 4x5 (in/in) 1 x Per Week/ ary Discharge Instructions: Apply to wound bed as instructed Secondary Dressing: ABD Pad, 8x10 1 x Per Week/ Discharge Instructions: Apply over primary dressing as directed. Secured With: [employer], 4.5x3.1 (in/yd) 1 x Per Week/ Discharge Instructions: Secure with Kerlix as directed. Secured With: Tubigrip 1 x Per Week/ 1. Hydrofera Blue 2. Tubigrip to the right lower extremity 3. Compression stocking daily to the left lower extremity- Gave Tubigrip in office. Also gave information to call [facility] to order new compression stockings if she would like Electronic Signature(s) [name] [name], [name] [name] ([id number]) [id number].pdf Page 7 of 9 Signed: 06/28/2023 4:08:41 PM By: Geralyn Corwin DO Entered By: Geralyn Corwin on 06/28/2023 13:36:16 -------------------------------------------------------------------------------- HxROS Details Patient Name: Date of Service: [name] [name], [name] [name]. 06/28/2023 12:30 PM Medical Record Number: [id number] Patient Account Number: [id number] Date of Birth/Sex: Treating RN: 1928-10-24 (89 y.o. F) Primary Care Provider: Dettinger, Ivin Booty Other Clinician: Referring Provider: Treating Provider/Extender: Geralyn Corwin Dettinger, Larence Penning in Treatment: 4 Information Obtained From Patient Chart Eyes Medical History: Positive for: Glaucoma Negative for: Cataracts Ear/Nose/Mouth/Throat Medical History: Negative for: Chronic sinus problems/congestion; Middle ear problems Hematologic/Lymphatic Medical  History: Negative for: Anemia; Hemophilia; Human Immunodeficiency Virus; Lymphedema; Sickle Cell Disease Respiratory Medical History: Negative for: Aspiration; Asthma; Chronic Obstructive Pulmonary Disease (COPD); Pneumothorax; Sleep Apnea; Tuberculosis Cardiovascular Medical History: Positive for: Arrhythmia - a. flutter, a. FIB; Congestive Heart Failure; Deep Vein Thrombosis - 1998; Hypertension Negative for: Angina; Coronary Artery Disease; Hypotension; Myocardial Infarction; Peripheral Arterial Disease; Peripheral Venous Disease; Phlebitis; Vasculitis Past Medical History Notes: PACEMAKER Gastrointestinal Medical History: Negative for: Cirrhosis ; Colitis; Crohns; Hepatitis A; Hepatitis [name]; Hepatitis C Past Medical History Notes: GERD IBS Diverticulosis Genitourinary Medical History: Negative for: End Stage Renal Disease Immunological Medical History: Negative for: Lupus Erythematosus; Raynauds; Scleroderma Integumentary (Skin) Medical History: Negative for: History of Burn Musculoskeletal [name] [name], [name] [name] ([id number]) [id number] Page 8 of 9 Medical History: Positive for: Osteoarthritis Negative for: Gout; Rheumatoid Arthritis; Osteomyelitis Past Medical History Notes: Osteoporosis Neurologic Medical History: Negative for: Dementia; Neuropathy; Quadriplegia; Paraplegia; Seizure Disorder Oncologic Medical History: Negative for: Received Chemotherapy; Received Radiation Past Medical History Notes: basal cell HBO Extended History Items Eyes:  Glaucoma Immunizations Pneumococcal Vaccine: Received Pneumococcal Vaccination: Yes Received Pneumococcal Vaccination On or After 60th Birthday: Yes Implantable Devices No devices added Hospitalization / Surgery History Type of Hospitalization/Surgery right knee replacement 2016 left knee replacement 2013 pacemaker 2003 breast lumpectomy Family and Social History Cancer: Yes -  Father,Siblings,Maternal Grandparents,Child; Diabetes: Yes - Siblings; Heart Disease: Yes - Siblings,Paternal Grandparents; Hereditary Spherocytosis: No; Hypertension: Yes - Father; Kidney Disease: No; Lung Disease: No; Seizures: No; Stroke: Yes - Mother; Thyroid Problems: No; Tuberculosis: No; Never smoker; Marital Status - Widowed; Alcohol Use: Never; Drug Use: No History; Caffeine Use: Never Social Determinants of Health (SDOH) 1. In the past 2 months, did you or others you live with eat smaller meals or skip meals because you didn't have money for foodo : No 2. Are you homeless or worried that you might be in the futureo : No 3. Do you have trouble paying for your utilities (gas, electricity, phone)o : No 4. Do you have trouble finding or paying for a rideo : No 5. Do you need daycare, or better daycare, for your kidso : No 6. Are you unemployed or without regular incomeo : No 7. Do you need help finding a better jobo : No 8. Do you need help getting more educationo : No 9. Are you concerned about someone in your home using drugs or alcoholo : No 10. Do you feel unsafe in your daily lifeo : No 11. Is anyone in your home threatening or abusing youo : No 12. Do you lack quality relationships that make you feel valued and supportedo : No 13. Do you need help getting cultural information in a language you understando : No 14. Do you need help getting internet accesso : No Advanced Directives and Instructions Spiritual or Cultural beliefs preclude asking about Advance Care Planning: No Advanced Directives: Yes; cone has a copy. Copy Provided: No Do not resuscitate: No Living Will: No Medical Power of Attorney: No Surrogate Decision Maker: No Electronic Signature(s) Signed: 06/28/2023 4:08:41 PM By: Geralyn Corwin DO Entered By: Geralyn Corwin on 06/28/2023 13:21:53 Garciamartinez, Ted Mcalpine ([id number]) [id number] Page 9 of  9 -------------------------------------------------------------------------------- SuperBill Details Patient Name: Date of Service: [name] [name] 06/28/2023 Medical Record Number: [id number] Patient Account Number: [id number] Date of Birth/Sex: Treating RN: December 03, 1928 (89 y.o. F) Primary Care Provider: Dettinger, Ivin Booty Other Clinician: Referring Provider: Treating Provider/Extender: Geralyn Corwin Dettinger, Larence Penning in Treatment: 4 Diagnosis Coding ICD-10 Codes Code Description [id number] Chronic venous hypertension (idiopathic) with inflammation of bilateral lower extremity L97.828 Non-pressure chronic ulcer of other part of left lower leg with other specified severity L97.818 Non-pressure chronic ulcer of other part of right lower leg with other specified severity L97.518 Non-pressure chronic ulcer of other part of right foot with other specified severity L03.116 Cellulitis of left lower limb Facility Procedures : CPT4 Code: [id number] Description: 99214 - WOUND CARE VISIT-LEV 4 EST PT Modifier: Quantity: 1 Physician Procedures : CPT4 Code Description Modifier [id number] 99213 - WC PHYS LEVEL 3 - EST PT ICD-10 Diagnosis Description I87.323 Chronic venous hypertension (idiopathic) with inflammation of bilateral lower extremity L97.828 Non-pressure chronic ulcer of other part of  left lower leg with other specified severity L97.818 Non-pressure chronic ulcer of other part of right lower leg with other specified severity L97.518 Non-pressure chronic ulcer of other part of right foot with other specified severity Quantity: 1 Electronic Signature(s) Signed: 06/28/2023 4:39:29 PM By: Redmond Pulling RN, BSN Signed: 06/29/2023 12:22:19 PM By: Geralyn Corwin DO Previous Signature:  06/28/2023 4:08:41 PM Version By: Geralyn Corwin DO Entered By: Redmond Pulling on 06/28/2023 16:29:25

## 2023-06-28 NOTE — Progress Notes (Signed)
[name] ([id number]) [id number] Page 1 of 12 Visit Report for 06/28/2023 Arrival Information Details Patient Name: Date of Service: [name] 06/28/2023 12:30 PM Medical Record Number: [id number] Patient Account Number: [id number] Date of Birth/Sex: Treating RN: [birth year] (89 y.o. [name] Primary Care [name]: [name] Other Clinician: Referring [name]: Treating [name]/Extender: [name] Dettinger, [name] in Treatment: 4 Visit Information History Since Last Visit Added or deleted any medications: No Patient Arrived: Wheel Chair Any new allergies or adverse reactions: No Arrival Time: 12:48 Had a fall or experienced change in Yes Accompanied By: self activities of daily living that may affect Transfer Assistance: None risk of falls: Patient Identification Verified: Yes Signs or symptoms of abuse/neglect since last visito No Secondary Verification Process Completed: Yes Hospitalized since last visit: No Patient Requires Transmission-Based Precautions: No Implantable device outside of the clinic excluding No Patient Has Alerts: Yes cellular tissue based products placed in the center Patient Alerts: Patient on Blood Thinner since last visit: Has Dressing in Place as Prescribed: Yes Has Compression in Place as Prescribed: Yes Pain Present Now: Yes Electronic Signature(s) Signed: 06/28/2023 4:39:29 PM By: [name] Pulling RN, BSN Entered By: [name] on 06/28/2023 12:49:35 -------------------------------------------------------------------------------- Clinic Level of Care Assessment Details Patient Name: Date of Service: [name] 06/28/2023 12:30 PM Medical Record Number: [id number] Patient Account Number: [id number] Date of Birth/Sex: Treating RN: 1929-05-27 (89 y.o. [name] Primary Care [name]: [name] Other Clinician: Referring [name]: Treating  [name]/Extender: [name] Dettinger, [name] in Treatment: 4 Clinic Level of Care Assessment Items TOOL 4 Quantity Score X- 1 0 Use when only an EandM is performed on FOLLOW-UP visit ASSESSMENTS - Nursing Assessment / Reassessment X- 1 10 Reassessment of Co-morbidities (includes updates in patient status) X- 1 5 Reassessment of Adherence to Treatment Plan ASSESSMENTS - Wound and Skin A ssessment / Reassessment []  - 0 Simple Wound Assessment / Reassessment - one wound X- 4 5 Complex Wound Assessment / Reassessment - multiple wounds []  - 0 Dermatologic / Skin Assessment (not related to wound area) ASSESSMENTS - Focused Assessment X- 2 5 Circumferential Edema Measurements - multi extremities []  - 0 Nutritional Assessment / Counseling / Intervention [name] ([id number]) [id number] Page 2 of 12 []  - 0 Lower Extremity Assessment (monofilament, tuning fork, pulses) []  - 0 Peripheral Arterial Disease Assessment (using hand held doppler) ASSESSMENTS - Ostomy and/or Continence Assessment and Care []  - 0 Incontinence Assessment and Management []  - 0 Ostomy Care Assessment and Management (repouching, etc.) PROCESS - Coordination of Care []  - 0 Simple Patient / Family Education for ongoing care X- 1 20 Complex (extensive) Patient / Family Education for ongoing care X- 1 10 Staff obtains Chiropractor, Records, T Results / Process Orders est X- 1 10 Staff telephones HHA, Nursing Homes / Clarify orders / etc []  - 0 Routine Transfer to another Facility (non-emergent condition) []  - 0 Routine Hospital Admission (non-emergent condition) []  - 0 New Admissions / Manufacturing engineer / Ordering NPWT Apligraf, etc. , []  - 0 Emergency Hospital Admission (emergent condition) []  - 0 Simple Discharge Coordination []  - 0 Complex (extensive) Discharge Coordination PROCESS - Special Needs []  - 0 Pediatric / Minor Patient  Management []  - 0 Isolation Patient Management []  - 0 Hearing / Language / Visual special needs []  - 0 Assessment of Community assistance (transportation, D/C planning, etc.) []  - 0 Additional assistance / Altered mentation []  - 0 Support Surface(s) Assessment (bed,  cushion, seat, etc.) INTERVENTIONS - Wound Cleansing / Measurement []  - 0 Simple Wound Cleansing - one wound X- 2 5 Complex Wound Cleansing - multiple wounds X- 1 5 Wound Imaging (photographs - any number of wounds) []  - 0 Wound Tracing (instead of photographs) []  - 0 Simple Wound Measurement - one wound X- 2 5 Complex Wound Measurement - multiple wounds INTERVENTIONS - Wound Dressings []  - 0 Small Wound Dressing one or multiple wounds X- 1 15 Medium Wound Dressing one or multiple wounds []  - 0 Large Wound Dressing one or multiple wounds X- 1 5 Application of Medications - topical []  - 0 Application of Medications - injection INTERVENTIONS - Miscellaneous []  - 0 External ear exam []  - 0 Specimen Collection (cultures, biopsies, blood, body fluids, etc.) []  - 0 Specimen(s) / Culture(s) sent or taken to Lab for analysis []  - 0 Patient Transfer (multiple staff / Nurse, adult / Similar devices) []  - 0 Simple Staple / Suture removal (25 or less) []  - 0 Complex Staple / Suture removal (26 or more) []  - 0 Hypo / Hyperglycemic Management (close monitor of Blood Glucose) [name] ([id number]) [id number] Page 3 of 12 []  - 0 Ankle / Brachial Index (ABI) - do not check if billed separately X- 1 5 Vital Signs Has the patient been seen at the hospital within the last three years: Yes Total Score: 135 Level Of Care: New/Established - Level 4 Electronic Signature(s) Signed: 06/28/2023 4:39:29 PM By: [name] Pulling RN, BSN Entered By: [name] on 06/28/2023 16:29:14 -------------------------------------------------------------------------------- Encounter Discharge  Information Details Patient Name: Date of Service: [name], Brien Few [name]. 06/28/2023 12:30 PM Medical Record Number: [id number] Patient Account Number: [id number] Date of Birth/Sex: Treating RN: [birth year] (89 y.o. [name] Primary Care Jermanie Minshall: [name] Other Clinician: Referring Miken Stecher: Treating Chrysten Woulfe/Extender: [name] Dettinger, [name] in Treatment: 4 Encounter Discharge Information Items Discharge Condition: Stable Ambulatory Status: Wheelchair Discharge Destination: Home Transportation: Private Auto Accompanied By: sons Schedule Follow-up Appointment: Yes Clinical Summary of Care: Patient Declined Electronic Signature(s) Signed: 06/28/2023 4:39:29 PM By: [name] Pulling RN, BSN Entered By: [name] on 06/28/2023 16:27:49 -------------------------------------------------------------------------------- Lower Extremity Assessment Details Patient Name: Date of Service: [name], Brien Few [name]. 06/28/2023 12:30 PM Medical Record Number: [id number] Patient Account Number: [id number] Date of Birth/Sex: Treating RN: July 10, 1928 (89 y.o. [name] Primary Care Javanni Maring: [name] Other Clinician: Referring Willow Shidler: Treating Emunah Texidor/Extender: [name] Dettinger, [name] in Treatment: 4 Edema Assessment Assessed: Kyra Searles: No] Franne Forts: No] [Left: Edema] [Right: :] Calf Left: Right: Point of Measurement: 21 cm From Medial Instep 37.8 cm 31 cm Ankle Left: Right: Point of Measurement: 9 cm From Medial Instep 19.5 cm 19 cm Knee To Floor Left: Right: MARLEI, HARRAH [name] ([id number]) [id number] Page 4 of 12 From Medial Instep 39 cm 39 cm Vascular Assessment Pulses: Dorsalis Pedis Palpable: [Left:Yes] [Right:Yes] Extremity colors, hair growth, and conditions: Extremity Color: [Left:Normal] [Right:Normal] Hair Growth on Extremity: [Left:No] [Right:No] Temperature of Extremity: [Left:Warm]  [Right:Warm] Dependent Rubor: [Left:No No] [Right:No No] Electronic Signature(s) Signed: 06/28/2023 4:39:29 PM By: [name] Pulling RN, BSN Entered By: [name] on 06/28/2023 13:28:05 -------------------------------------------------------------------------------- Multi Wound Chart Details Patient Name: Date of Service: [name], Brien Few [name]. 06/28/2023 12:30 PM Medical Record Number: [id number] Patient Account Number: [id number] Date of Birth/Sex: Treating RN: 1928-10-08 (89 y.o. F) Primary Care Jessi Jessop: [name] Other Clinician: Referring Meko Bellanger: Treating Naveya Ellerman/Extender: [name] Dettinger, [name] in Treatment: 4 Vital Signs Height(in): 63 Pulse(bpm):  60 Weight(lbs): 135 Blood Pressure(mmHg): 94/56 Body Mass Index(BMI): 23.9 Temperature(F): 97.9 Respiratory Rate(breaths/min): 18 [1:Photos: No Photos] [3:No Photos] Left, Medial Lower Leg Right, Dorsal Foot Right Lower Leg Wound Location: Gradually Appeared Gradually Appeared Gradually Appeared Wounding Event: Venous Leg Ulcer Venous Leg Ulcer Venous Leg Ulcer Primary Etiology: Glaucoma, Arrhythmia, Congestive Glaucoma, Arrhythmia, Congestive Glaucoma, Arrhythmia, Congestive Comorbid History: Heart Failure, Deep Vein Thrombosis, Heart Failure, Deep Vein Thrombosis, Heart Failure, Deep Vein Thrombosis, Hypertension, Osteoarthritis Hypertension, Osteoarthritis Hypertension, Osteoarthritis 05/17/2023 03/01/2023 05/24/2023 Date Acquired: 4 4 4  Weeks of Treatment: Healed - Epithelialized Open Healed - Epithelialized Wound Status: No No No Wound Recurrence: 0x0x0 1x1.3x0.1 0x0x0 Measurements L x W x D (cm) 0 1.021 0 A (cm) : rea 0 0.102 0 Volume (cm) : 100.00% 49.00% 100.00% % Reduction in A rea: 100.00% 74.60% 100.00% % Reduction in Volume: Partial Thickness Partial Thickness Partial Thickness Classification: None Present Medium None Present Exudate A mount: N/A Serosanguineous  N/A Exudate Type: N/A red, brown N/A Exudate Color: None Present (0%) Large (67-100%) None Present (0%) Granulation A mount: N/A Red N/A Granulation Quality: None Present (0%) Small (1-33%) None Present (0%) Necrotic A mount: Fascia: No N/A Fascia: No Exposed Structures: Stankowski, Burnie [name] ([id number]) [id number] Page 5 of 12 Fat Layer (Subcutaneous Tissue): No Fat Layer (Subcutaneous Tissue): No Tendon: No Tendon: No Muscle: No Muscle: No Joint: No Joint: No Bone: No Bone: No Large (67-100%) Medium (34-66%) Large (67-100%) Epithelialization: Wound Number: 4 N/A N/A Photos: No Photos N/A N/A Right, Proximal, Dorsal Foot N/A N/A Wound Location: Gradually Appeared N/A N/A Wounding Event: Venous Leg Ulcer N/A N/A Primary Etiology: Glaucoma, Arrhythmia, Congestive N/A N/A Comorbid History: Heart Failure, Deep Vein Thrombosis, Hypertension, Osteoarthritis 05/31/2023 N/A N/A Date Acquired: 4 N/A N/A Weeks of Treatment: Healed - Epithelialized N/A N/A Wound Status: No N/A N/A Wound Recurrence: 0x0x0 N/A N/A Measurements L x W x D (cm) 0 N/A N/A A (cm) : rea 0 N/A N/A Volume (cm) : 100.00% N/A N/A % Reduction in Area: 100.00% N/A N/A % Reduction in Volume: Full Thickness Without Exposed N/A N/A Classification: Support Structures None Present N/A N/A Exudate Amount: N/A N/A N/A Exudate Type: N/A N/A N/A Exudate Color: None Present (0%) N/A N/A Granulation Amount: N/A N/A N/A Granulation Quality: None Present (0%) N/A N/A Necrotic Amount: Fascia: No N/A N/A Exposed Structures: Fat Layer (Subcutaneous Tissue): No Tendon: No Muscle: No Joint: No Bone: No Large (67-100%) N/A N/A Epithelialization: Treatment Notes Electronic Signature(s) Signed: 06/28/2023 4:08:41 PM By: [name] Corwin DO Entered By: [name] on 06/28/2023  13:19:04 -------------------------------------------------------------------------------- Multi-Disciplinary Care Plan Details Patient Name: Date of Service: [name], Brien Few [name]. 06/28/2023 12:30 PM Medical Record Number: [id number] Patient Account Number: [id number] Date of Birth/Sex: Treating RN: May 15, 1929 (89 y.o. [name] Primary Care Ashani Pumphrey: [name] Other Clinician: Referring Sharlot Sturkey: Treating Jabarri Stefanelli/Extender: [name] Dettinger, [name] in Treatment: 4 Active Inactive Venous Leg Ulcer Nursing Diagnoses: Actual venous Insuffiency (use after diagnosis is confirmed) Knowledge deficit related to disease process and management Goals: Patient will maintain optimal edema control Date Initiated: 05/31/2023 Date Inactivated: 06/28/2023 Target Resolution Date: 07/12/2023 Goal Status: Met Patient/caregiver will verbalize understanding of disease process and disease management Date Initiated: 05/31/2023 Target Resolution Date: 07/12/2023 LATEISHA, DEITZ ([id number]) [phone].pdf Page 6 of 12 Goal Status: Active Verify adequate tissue perfusion prior to therapeutic compression application Date Initiated: 05/31/2023 Target Resolution Date: 07/12/2023 Goal Status: Active Interventions: Assess peripheral edema status every visit. Compression as ordered Provide  education on venous insufficiency Notes: Wound/Skin Impairment Nursing Diagnoses: Impaired tissue integrity Knowledge deficit related to ulceration/compromised skin integrity Goals: Patient/caregiver will verbalize understanding of skin care regimen Date Initiated: 05/31/2023 Target Resolution Date: 07/12/2023 Goal Status: Active Ulcer/skin breakdown will have a volume reduction of 30% by week 4 Date Initiated: 05/31/2023 Target Resolution Date: 06/28/2023 Goal Status: Active Interventions: Assess patient/caregiver ability to obtain necessary supplies Assess  patient/caregiver ability to perform ulcer/skin care regimen upon admission and as needed Assess ulceration(s) every visit Provide education on ulcer and skin care Notes: Electronic Signature(s) Signed: 06/28/2023 4:39:29 PM By: [name] Pulling RN, BSN Entered By: [name] on 06/28/2023 13:14:39 -------------------------------------------------------------------------------- Pain Assessment Details Patient Name: Date of Service: [name], Brien Few [name]. 06/28/2023 12:30 PM Medical Record Number: [id number] Patient Account Number: [id number] Date of Birth/Sex: Treating RN: [birth year] (89 y.o. [name] Primary Care Samora Jernberg: [name] Other Clinician: Referring Keoki Mchargue: Treating Keltie Labell/Extender: [name] Dettinger, [name] in Treatment: 4 Active Problems Location of Pain Severity and Description of Pain Patient Has Paino Yes Site Locations Rate the pain. ROSE, BEAULIEU [name] ([id number]) [id number] Page 7 of 12 Rate the pain. Current Pain Level: 3 Pain Management and Medication Current Pain Management: Electronic Signature(s) Signed: 06/28/2023 4:39:29 PM By: [name] Pulling RN, BSN Entered By: [name] on 06/28/2023 12:50:23 -------------------------------------------------------------------------------- Patient/Caregiver Education Details Patient Name: Date of Service: [name], Lacy Duverney 1/16/2025andnbsp12:30 PM Medical Record Number: [id number] Patient Account Number: [id number] Date of Birth/Gender: Treating RN: 02/17/1929 (89 y.o. [name] Primary Care Physician: [name] Other Clinician: Referring Physician: Treating Physician/Extender: [name] Dettinger, [name] in Treatment: 4 Education Assessment Education Provided To: Patient Education Topics Provided Wound/Skin Impairment: Methods: Explain/Verbal Responses: State content correctly Electronic  Signature(s) Signed: 06/28/2023 4:39:29 PM By: [name] Pulling RN, BSN Entered By: [name] on 06/28/2023 13:15:13 -------------------------------------------------------------------------------- Wound Assessment Details Patient Name: Date of Service: [name], Brien Few [name]. 06/28/2023 12:30 PM Medical Record Number: [id number] Patient Account Number: [id number] Date of Birth/Sex: Treating RN: 07-02-1928 (89 y.o. [name] Primary Care Tae Robak: [name] Other Clinician: Referring Debborah Alonge: Treating Keldan Eplin/Extender: [name] Dettinger, Daine Shearon, [place] [name] ([id number]) [id number] Page 8 of 12 Weeks in Treatment: 4 Wound Status Wound Number: 1 Primary Venous Leg Ulcer Etiology: Wound Location: Left, Medial Lower Leg Wound Healed - Epithelialized Wounding Event: Gradually Appeared Status: Date Acquired: 05/17/2023 Comorbid Glaucoma, Arrhythmia, Congestive Heart Failure, Deep Vein Weeks Of Treatment: 4 History: Thrombosis, Hypertension, Osteoarthritis Clustered Wound: No Wound Measurements Length: (cm) Width: (cm) Depth: (cm) Area: (cm) Volume: (cm) 0 % Reduction in Area: 100% 0 % Reduction in Volume: 100% 0 Epithelialization: Large (67-100%) 0 Tunneling: No 0 Undermining: No Wound Description Classification: Partial Thickness Exudate Amount: None Present Foul Odor After Cleansing: No Slough/Fibrino No Wound Bed Granulation Amount: None Present (0%) Exposed Structure Necrotic Amount: None Present (0%) Fascia Exposed: No Fat Layer (Subcutaneous Tissue) Exposed: No Tendon Exposed: No Muscle Exposed: No Joint Exposed: No Bone Exposed: No Periwound Skin Texture Texture Color No Abnormalities Noted: No No Abnormalities Noted: No Moisture No Abnormalities Noted: No Electronic Signature(s) Signed: 06/28/2023 4:39:29 PM By: [name] Pulling RN, BSN Entered By: [name] on 06/28/2023  13:03:24 -------------------------------------------------------------------------------- Wound Assessment Details Patient Name: Date of Service: [name], Brien Few [name]. 06/28/2023 12:30 PM Medical Record Number: [id number] Patient Account Number: [id number] Date of Birth/Sex: Treating RN: 25-Jun-1928 (89 y.o. [name] Primary Care Candence Sease: [name] Other Clinician: Referring Rozanne Heumann: Treating Lamira Borin/Extender: [name] Dettinger,  [name] in Treatment: 4 Wound Status Wound Number: 2 Primary Venous Leg Ulcer Etiology: Wound Location: Right, Dorsal Foot Wound Open Wounding Event: Gradually Appeared Status: Date Acquired: 03/01/2023 Comorbid Glaucoma, Arrhythmia, Congestive Heart Failure, Deep Vein Weeks Of Treatment: 4 History: Thrombosis, Hypertension, Osteoarthritis Clustered Wound: No Photos ESTELA, BECKENDORF [name] ([id number]) [id number] Page 9 of 12 Wound Measurements Length: (cm) 1 Width: (cm) 1.3 Depth: (cm) 0.1 Area: (cm) 1.021 Volume: (cm) 0.102 % Reduction in Area: 49% % Reduction in Volume: 74.6% Epithelialization: Medium (34-66%) Tunneling: No Undermining: No Wound Description Classification: Partial Thickness Exudate Amount: Medium Exudate Type: Serosanguineous Exudate Color: red, brown Foul Odor After Cleansing: No Slough/Fibrino Yes Wound Bed Granulation Amount: Large (67-100%) Granulation Quality: Red Necrotic Amount: Small (1-33%) Necrotic Quality: Adherent Slough Periwound Skin Texture Texture Color No Abnormalities Noted: No No Abnormalities Noted: No Moisture No Abnormalities Noted: No Treatment Notes Wound #2 (Foot) Wound Laterality: Dorsal, Right Cleanser Soap and Water Discharge Instruction: May shower and wash wound with dial antibacterial soap and water prior to dressing change. Peri-Wound Care Triamcinolone 15 (g) Discharge Instruction: Use triamcinolone 15 (g) as directed Sween  Lotion (Moisturizing lotion) Discharge Instruction: Apply moisturizing lotion as directed Topical Gentamicin Discharge Instruction: As directed by physician Mupirocin Ointment Discharge Instruction: Apply Mupirocin (Bactroban) as instructed Primary Dressing Hydrofera Blue Ready Transfer Foam, 4x5 (in/in) Discharge Instruction: Apply to wound bed as instructed Secondary Dressing ABD Pad, 8x10 Discharge Instruction: Apply over primary dressing as directed. Secured With [employer], 4.5x3.1 (in/yd) Discharge Instruction: Secure with Kerlix as directed. Tubigrip Compression Wrap Hersman, Verina [name] ([id number]) [id number] Page 10 of 12 Compression Stockings Add-Ons Electronic Signature(s) Signed: 06/28/2023 4:39:29 PM By: [name] Pulling RN, BSN Entered By: [name] on 06/28/2023 13:06:45 -------------------------------------------------------------------------------- Wound Assessment Details Patient Name: Date of Service: [name], Brien Few [name]. 06/28/2023 12:30 PM Medical Record Number: [id number] Patient Account Number: [id number] Date of Birth/Sex: Treating RN: July 16, 1928 (89 y.o. [name] Primary Care Laquisha Northcraft: [name] Other Clinician: Referring Mathews Stuhr: Treating Asianna Brundage/Extender: [name] Dettinger, [name] in Treatment: 4 Wound Status Wound Number: 3 Primary Venous Leg Ulcer Etiology: Wound Location: Right Lower Leg Wound Healed - Epithelialized Wounding Event: Gradually Appeared Status: Date Acquired: 05/24/2023 Comorbid Glaucoma, Arrhythmia, Congestive Heart Failure, Deep Vein Weeks Of Treatment: 4 History: Thrombosis, Hypertension, Osteoarthritis Clustered Wound: No Wound Measurements Length: (cm) Width: (cm) Depth: (cm) Area: (cm) Volume: (cm) 0 % Reduction in Area: 100% 0 % Reduction in Volume: 100% 0 Epithelialization: Large (67-100%) 0 Tunneling: No 0 Undermining: No Wound  Description Classification: Partial Thickness Exudate Amount: None Present Foul Odor After Cleansing: No Slough/Fibrino No Wound Bed Granulation Amount: None Present (0%) Exposed Structure Necrotic Amount: None Present (0%) Fascia Exposed: No Fat Layer (Subcutaneous Tissue) Exposed: No Tendon Exposed: No Muscle Exposed: No Joint Exposed: No Bone Exposed: No Periwound Skin Texture Texture Color No Abnormalities Noted: No No Abnormalities Noted: No Moisture No Abnormalities Noted: No Electronic Signature(s) Signed: 06/28/2023 4:39:29 PM By: [name] Pulling RN, BSN Entered By: [name] on 06/28/2023 13:04:09 Swiatek, Drummond [name] ([id number]) [id number] Page 11 of 12 -------------------------------------------------------------------------------- Wound Assessment Details Patient Name: Date of Service: [name] 06/28/2023 12:30 PM Medical Record Number: [id number] Patient Account Number: [id number] Date of Birth/Sex: Treating RN: 29-Oct-1928 (89 y.o. [name] Primary Care Erick Murin: [name] Other Clinician: Referring Karna Abed: Treating Juanmiguel Defelice/Extender: [name] Dettinger, [name] in Treatment: 4 Wound Status Wound Number: 4 Primary Venous Leg Ulcer Etiology: Wound Location: Right, Proximal,  Dorsal Foot Wound Healed - Epithelialized Wounding Event: Gradually Appeared Status: Date Acquired: 05/31/2023 Comorbid Glaucoma, Arrhythmia, Congestive Heart Failure, Deep Vein Weeks Of Treatment: 4 History: Thrombosis, Hypertension, Osteoarthritis Clustered Wound: No Wound Measurements Length: (cm) Width: (cm) Depth: (cm) Area: (cm) Volume: (cm) 0 % Reduction in Area: 100% 0 % Reduction in Volume: 100% 0 Epithelialization: Large (67-100%) 0 Tunneling: No 0 Undermining: No Wound Description Classification: Full Thickness Without Exposed Support Structures Exudate Amount: None Present Foul Odor After  Cleansing: No Slough/Fibrino No Wound Bed Granulation Amount: None Present (0%) Exposed Structure Necrotic Amount: None Present (0%) Fascia Exposed: No Fat Layer (Subcutaneous Tissue) Exposed: No Tendon Exposed: No Muscle Exposed: No Joint Exposed: No Bone Exposed: No Periwound Skin Texture Texture Color No Abnormalities Noted: No No Abnormalities Noted: No Moisture No Abnormalities Noted: No Electronic Signature(s) Signed: 06/28/2023 4:39:29 PM By: [name] Pulling RN, BSN Entered By: [name] on 06/28/2023 13:04:32 -------------------------------------------------------------------------------- Vitals Details Patient Name: Date of Service: [name], Brien Few [name]. 06/28/2023 12:30 PM Medical Record Number: [id number] Patient Account Number: [id number] Date of Birth/Sex: Treating RN: 1929/03/03 (89 y.o. [name] Primary Care Yasmin Dibello: [name] Other Clinician: Referring Ikeya Brockel: Treating Joyann Spidle/Extender: [name] Dettinger, [name] in Treatment: 4 Vital Signs Time Taken: 12:49 Temperature (F): 97.9 Height (in): 63 Pulse (bpm): 60 Weight (lbs): 135 Respiratory Rate (breaths/min): 18 Body Mass Index (BMI): 23.9 Blood Pressure (mmHg): 94/56 Reference Range: 80 - 120 mg / dl Murthy, Saman [name] ([id number]) [id number] Page 12 of 12 Electronic Signature(s) Signed: 06/28/2023 4:39:29 PM By: [name] Pulling RN, BSN Entered By: [name] on 06/28/2023 12:50:10

## 2023-07-02 ENCOUNTER — Ambulatory Visit (INDEPENDENT_AMBULATORY_CARE_PROVIDER_SITE_OTHER): Payer: Medicare Other

## 2023-07-02 DIAGNOSIS — I480 Paroxysmal atrial fibrillation: Secondary | ICD-10-CM

## 2023-07-05 ENCOUNTER — Ambulatory Visit (HOSPITAL_BASED_OUTPATIENT_CLINIC_OR_DEPARTMENT_OTHER): Payer: Medicare Other | Admitting: Internal Medicine

## 2023-07-05 LAB — CUP PACEART REMOTE DEVICE CHECK
Battery Remaining Longevity: 93 mo
Battery Remaining Percentage: 84 %
Battery Voltage: 3.01 V
Brady Statistic AP VP Percent: 99 %
Brady Statistic AP VS Percent: 1 %
Brady Statistic AS VP Percent: 1 %
Brady Statistic AS VS Percent: 0 %
Brady Statistic RA Percent Paced: 99 %
Brady Statistic RV Percent Paced: 99 %
Date Time Interrogation Session: 20250122124414
Implantable Lead Connection Status: 753985
Implantable Lead Connection Status: 753985
Implantable Lead Implant Date: 20031028
Implantable Lead Implant Date: 20031028
Implantable Lead Location: 753859
Implantable Lead Location: 753860
Implantable Pulse Generator Implant Date: 20230421
Lead Channel Impedance Value: 390 Ohm
Lead Channel Impedance Value: 460 Ohm
Lead Channel Pacing Threshold Amplitude: 0.5 V
Lead Channel Pacing Threshold Amplitude: 0.5 V
Lead Channel Pacing Threshold Pulse Width: 0.5 ms
Lead Channel Pacing Threshold Pulse Width: 0.5 ms
Lead Channel Sensing Intrinsic Amplitude: 10.5 mV
Lead Channel Setting Pacing Amplitude: 0.75 V
Lead Channel Setting Pacing Amplitude: 2 V
Lead Channel Setting Pacing Pulse Width: 0.5 ms
Lead Channel Setting Sensing Sensitivity: 4 mV
Pulse Gen Model: 2272
Pulse Gen Serial Number: 8074688

## 2023-07-06 ENCOUNTER — Encounter (HOSPITAL_BASED_OUTPATIENT_CLINIC_OR_DEPARTMENT_OTHER): Payer: Medicare Other | Admitting: Internal Medicine

## 2023-07-07 ENCOUNTER — Encounter: Payer: Self-pay | Admitting: Cardiovascular Disease

## 2023-07-12 ENCOUNTER — Encounter (HOSPITAL_BASED_OUTPATIENT_CLINIC_OR_DEPARTMENT_OTHER): Payer: Medicare Other | Admitting: Internal Medicine

## 2023-07-12 ENCOUNTER — Ambulatory Visit: Payer: Medicare Other | Attending: Nurse Practitioner | Admitting: Nurse Practitioner

## 2023-08-08 NOTE — Addendum Note (Signed)
 Addended by: Geralyn Flash D on: 08/08/2023 01:56 PM   Modules accepted: Orders

## 2023-08-08 NOTE — Progress Notes (Signed)
 Remote pacemaker transmission.

## 2023-08-11 DEATH — deceased

## 2023-10-01 ENCOUNTER — Ambulatory Visit: Payer: Medicare Other

## 2023-12-31 ENCOUNTER — Ambulatory Visit: Payer: Medicare Other

## 2024-03-31 ENCOUNTER — Ambulatory Visit: Payer: Medicare Other

## 2024-06-30 ENCOUNTER — Ambulatory Visit: Payer: Medicare Other

## 2024-09-29 ENCOUNTER — Ambulatory Visit: Payer: Medicare Other

## 2024-12-29 ENCOUNTER — Ambulatory Visit: Payer: Medicare Other
